# Patient Record
Sex: Female | Born: 1935 | Race: White | Hispanic: No | Smoking: Never smoker
Health system: Southern US, Community
[De-identification: ages and names within clinical notes are randomized; demographics above are authoritative.]

## PROBLEM LIST (undated history)

## (undated) DIAGNOSIS — D126 Benign neoplasm of colon, unspecified: Secondary | ICD-10-CM

## (undated) DIAGNOSIS — Z9289 Personal history of other medical treatment: Secondary | ICD-10-CM

## (undated) DIAGNOSIS — J449 Chronic obstructive pulmonary disease, unspecified: Secondary | ICD-10-CM

## (undated) DIAGNOSIS — E039 Hypothyroidism, unspecified: Secondary | ICD-10-CM

## (undated) DIAGNOSIS — J45909 Unspecified asthma, uncomplicated: Secondary | ICD-10-CM

## (undated) DIAGNOSIS — R0789 Other chest pain: Secondary | ICD-10-CM

## (undated) DIAGNOSIS — E785 Hyperlipidemia, unspecified: Secondary | ICD-10-CM

## (undated) DIAGNOSIS — M1990 Unspecified osteoarthritis, unspecified site: Secondary | ICD-10-CM

## (undated) DIAGNOSIS — R0602 Shortness of breath: Secondary | ICD-10-CM

## (undated) DIAGNOSIS — I10 Essential (primary) hypertension: Secondary | ICD-10-CM

## (undated) DIAGNOSIS — C801 Malignant (primary) neoplasm, unspecified: Secondary | ICD-10-CM

## (undated) DIAGNOSIS — K219 Gastro-esophageal reflux disease without esophagitis: Secondary | ICD-10-CM

## (undated) DIAGNOSIS — Z90710 Acquired absence of both cervix and uterus: Secondary | ICD-10-CM

## (undated) HISTORY — DX: Essential (primary) hypertension: I10

## (undated) HISTORY — DX: Hypothyroidism, unspecified: E03.9

## (undated) HISTORY — DX: Hyperlipidemia, unspecified: E78.5

## (undated) HISTORY — DX: Shortness of breath: R06.02

## (undated) HISTORY — PX: TOTAL SHOULDER REPLACEMENT: SUR1217

## (undated) HISTORY — DX: Other chest pain: R07.89

## (undated) HISTORY — DX: Benign neoplasm of colon, unspecified: D12.6

## (undated) HISTORY — PX: CARDIAC SURGERY: SHX584

## (undated) HISTORY — DX: Acquired absence of both cervix and uterus: Z90.710

## (undated) HISTORY — DX: Unspecified asthma, uncomplicated: J45.909

## (undated) HISTORY — DX: Unspecified osteoarthritis, unspecified site: M19.90

## (undated) HISTORY — DX: Personal history of other medical treatment: Z92.89

## (undated) HISTORY — PX: ABDOMINAL HYSTERECTOMY: SHX81

## (undated) HISTORY — PX: HEMORRHOID SURGERY: SHX153

---

## 1963-04-09 DIAGNOSIS — Z90710 Acquired absence of both cervix and uterus: Secondary | ICD-10-CM

## 1963-04-09 HISTORY — DX: Acquired absence of both cervix and uterus: Z90.710

## 1998-01-10 ENCOUNTER — Encounter: Payer: Self-pay | Admitting: Gastroenterology

## 1998-01-10 ENCOUNTER — Ambulatory Visit (HOSPITAL_COMMUNITY): Admission: RE | Admit: 1998-01-10 | Discharge: 1998-01-10 | Payer: Self-pay | Admitting: Gastroenterology

## 2000-02-05 ENCOUNTER — Encounter: Payer: Self-pay | Admitting: Family Medicine

## 2000-02-05 ENCOUNTER — Encounter: Admission: RE | Admit: 2000-02-05 | Discharge: 2000-02-05 | Payer: Self-pay | Admitting: Family Medicine

## 2000-02-15 ENCOUNTER — Ambulatory Visit (HOSPITAL_COMMUNITY): Admission: RE | Admit: 2000-02-15 | Discharge: 2000-02-15 | Payer: Self-pay | Admitting: *Deleted

## 2000-09-09 ENCOUNTER — Ambulatory Visit (HOSPITAL_COMMUNITY): Admission: RE | Admit: 2000-09-09 | Discharge: 2000-09-09 | Payer: Self-pay | Admitting: Orthopedic Surgery

## 2000-09-09 ENCOUNTER — Encounter: Payer: Self-pay | Admitting: Orthopedic Surgery

## 2001-02-05 ENCOUNTER — Encounter: Payer: Self-pay | Admitting: Family Medicine

## 2001-02-05 ENCOUNTER — Encounter: Admission: RE | Admit: 2001-02-05 | Discharge: 2001-02-05 | Payer: Self-pay | Admitting: Family Medicine

## 2001-02-09 ENCOUNTER — Other Ambulatory Visit: Admission: RE | Admit: 2001-02-09 | Discharge: 2001-02-09 | Payer: Self-pay | Admitting: Family Medicine

## 2001-08-17 ENCOUNTER — Encounter: Payer: Self-pay | Admitting: Family Medicine

## 2001-08-17 ENCOUNTER — Encounter: Admission: RE | Admit: 2001-08-17 | Discharge: 2001-08-17 | Payer: Self-pay | Admitting: Family Medicine

## 2002-04-06 ENCOUNTER — Encounter: Payer: Self-pay | Admitting: Family Medicine

## 2002-04-06 ENCOUNTER — Encounter: Admission: RE | Admit: 2002-04-06 | Discharge: 2002-04-06 | Payer: Self-pay | Admitting: Family Medicine

## 2002-12-15 ENCOUNTER — Other Ambulatory Visit: Admission: RE | Admit: 2002-12-15 | Discharge: 2002-12-15 | Payer: Self-pay | Admitting: Dermatology

## 2003-02-28 ENCOUNTER — Ambulatory Visit (HOSPITAL_COMMUNITY): Admission: RE | Admit: 2003-02-28 | Discharge: 2003-02-28 | Payer: Self-pay | Admitting: Internal Medicine

## 2003-05-30 ENCOUNTER — Encounter: Admission: RE | Admit: 2003-05-30 | Discharge: 2003-05-30 | Payer: Self-pay | Admitting: Family Medicine

## 2003-07-04 ENCOUNTER — Encounter (HOSPITAL_COMMUNITY): Admission: RE | Admit: 2003-07-04 | Discharge: 2003-08-03 | Payer: Self-pay | Admitting: Family Medicine

## 2003-07-25 ENCOUNTER — Ambulatory Visit (HOSPITAL_COMMUNITY): Admission: RE | Admit: 2003-07-25 | Discharge: 2003-07-25 | Payer: Self-pay | Admitting: Sports Medicine

## 2003-10-24 ENCOUNTER — Encounter: Admission: RE | Admit: 2003-10-24 | Discharge: 2003-10-24 | Payer: Self-pay | Admitting: Family Medicine

## 2004-03-05 ENCOUNTER — Encounter: Admission: RE | Admit: 2004-03-05 | Discharge: 2004-03-05 | Payer: Self-pay | Admitting: Family Medicine

## 2004-10-31 ENCOUNTER — Encounter: Admission: RE | Admit: 2004-10-31 | Discharge: 2004-10-31 | Payer: Self-pay | Admitting: Family Medicine

## 2005-02-05 ENCOUNTER — Ambulatory Visit (HOSPITAL_COMMUNITY): Admission: RE | Admit: 2005-02-05 | Discharge: 2005-02-05 | Payer: Self-pay | Admitting: Family Medicine

## 2005-06-26 ENCOUNTER — Ambulatory Visit (HOSPITAL_COMMUNITY): Admission: RE | Admit: 2005-06-26 | Discharge: 2005-06-26 | Payer: Self-pay | Admitting: Gastroenterology

## 2005-06-26 ENCOUNTER — Encounter (INDEPENDENT_AMBULATORY_CARE_PROVIDER_SITE_OTHER): Payer: Self-pay | Admitting: Specialist

## 2006-03-04 ENCOUNTER — Ambulatory Visit (HOSPITAL_COMMUNITY): Admission: RE | Admit: 2006-03-04 | Discharge: 2006-03-04 | Payer: Self-pay | Admitting: Family Medicine

## 2007-03-24 ENCOUNTER — Ambulatory Visit (HOSPITAL_COMMUNITY): Admission: RE | Admit: 2007-03-24 | Discharge: 2007-03-24 | Payer: Self-pay | Admitting: Family Medicine

## 2008-04-26 ENCOUNTER — Ambulatory Visit (HOSPITAL_COMMUNITY): Admission: RE | Admit: 2008-04-26 | Discharge: 2008-04-26 | Payer: Self-pay | Admitting: Family Medicine

## 2008-09-06 ENCOUNTER — Ambulatory Visit (HOSPITAL_COMMUNITY): Admission: RE | Admit: 2008-09-06 | Discharge: 2008-09-06 | Payer: Self-pay | Admitting: Family Medicine

## 2008-10-19 ENCOUNTER — Ambulatory Visit (HOSPITAL_COMMUNITY): Admission: RE | Admit: 2008-10-19 | Discharge: 2008-10-19 | Payer: Self-pay | Admitting: Orthopedic Surgery

## 2009-04-27 ENCOUNTER — Ambulatory Visit (HOSPITAL_COMMUNITY): Admission: RE | Admit: 2009-04-27 | Discharge: 2009-04-27 | Payer: Self-pay | Admitting: Family Medicine

## 2009-07-10 ENCOUNTER — Ambulatory Visit (HOSPITAL_COMMUNITY): Admission: RE | Admit: 2009-07-10 | Discharge: 2009-07-10 | Payer: Self-pay | Admitting: Family Medicine

## 2009-09-20 ENCOUNTER — Inpatient Hospital Stay (HOSPITAL_COMMUNITY): Admission: EM | Admit: 2009-09-20 | Discharge: 2009-09-24 | Payer: Self-pay | Admitting: Emergency Medicine

## 2009-09-20 ENCOUNTER — Ambulatory Visit: Payer: Self-pay | Admitting: Cardiology

## 2009-09-20 ENCOUNTER — Encounter (INDEPENDENT_AMBULATORY_CARE_PROVIDER_SITE_OTHER): Payer: Self-pay | Admitting: Internal Medicine

## 2009-10-18 ENCOUNTER — Ambulatory Visit: Payer: Self-pay | Admitting: Internal Medicine

## 2009-10-18 DIAGNOSIS — R059 Cough, unspecified: Secondary | ICD-10-CM | POA: Insufficient documentation

## 2009-10-18 DIAGNOSIS — R05 Cough: Secondary | ICD-10-CM

## 2009-10-18 DIAGNOSIS — C539 Malignant neoplasm of cervix uteri, unspecified: Secondary | ICD-10-CM | POA: Insufficient documentation

## 2009-10-18 DIAGNOSIS — E785 Hyperlipidemia, unspecified: Secondary | ICD-10-CM

## 2009-10-18 DIAGNOSIS — I10 Essential (primary) hypertension: Secondary | ICD-10-CM | POA: Insufficient documentation

## 2009-10-23 ENCOUNTER — Ambulatory Visit: Payer: Self-pay | Admitting: Internal Medicine

## 2009-10-23 DIAGNOSIS — J189 Pneumonia, unspecified organism: Secondary | ICD-10-CM | POA: Insufficient documentation

## 2009-12-14 ENCOUNTER — Encounter (INDEPENDENT_AMBULATORY_CARE_PROVIDER_SITE_OTHER): Payer: Self-pay | Admitting: *Deleted

## 2009-12-27 ENCOUNTER — Emergency Department (HOSPITAL_COMMUNITY): Admission: EM | Admit: 2009-12-27 | Discharge: 2009-12-27 | Payer: Self-pay | Admitting: Family Medicine

## 2009-12-28 ENCOUNTER — Inpatient Hospital Stay (HOSPITAL_COMMUNITY): Admission: EM | Admit: 2009-12-28 | Discharge: 2009-12-29 | Payer: Self-pay | Admitting: Emergency Medicine

## 2009-12-28 ENCOUNTER — Ambulatory Visit: Payer: Self-pay | Admitting: Family Medicine

## 2010-01-11 ENCOUNTER — Ambulatory Visit (HOSPITAL_COMMUNITY): Admission: RE | Admit: 2010-01-11 | Discharge: 2010-01-11 | Payer: Self-pay | Admitting: Family Medicine

## 2010-02-09 ENCOUNTER — Ambulatory Visit (HOSPITAL_COMMUNITY)
Admission: RE | Admit: 2010-02-09 | Discharge: 2010-02-09 | Payer: Self-pay | Source: Home / Self Care | Admitting: Family Medicine

## 2010-02-12 ENCOUNTER — Ambulatory Visit: Payer: Self-pay | Admitting: Gastroenterology

## 2010-02-12 DIAGNOSIS — K625 Hemorrhage of anus and rectum: Secondary | ICD-10-CM | POA: Insufficient documentation

## 2010-02-12 DIAGNOSIS — E119 Type 2 diabetes mellitus without complications: Secondary | ICD-10-CM

## 2010-02-12 DIAGNOSIS — K644 Residual hemorrhoidal skin tags: Secondary | ICD-10-CM | POA: Insufficient documentation

## 2010-02-12 HISTORY — DX: Hemorrhage of anus and rectum: K62.5

## 2010-03-05 ENCOUNTER — Ambulatory Visit: Payer: Self-pay | Admitting: Gastroenterology

## 2010-03-09 ENCOUNTER — Encounter: Payer: Self-pay | Admitting: Gastroenterology

## 2010-04-29 ENCOUNTER — Encounter: Payer: Self-pay | Admitting: Sports Medicine

## 2010-05-01 ENCOUNTER — Ambulatory Visit (HOSPITAL_BASED_OUTPATIENT_CLINIC_OR_DEPARTMENT_OTHER)
Admission: RE | Admit: 2010-05-01 | Discharge: 2010-05-01 | Disposition: A | Discharge status: Home / Self Care | Payer: Self-pay | Source: Home / Self Care | Attending: Orthopedic Surgery | Admitting: Orthopedic Surgery

## 2010-05-01 ENCOUNTER — Ambulatory Visit (HOSPITAL_COMMUNITY): Admission: RE | Admit: 2010-05-01 | Payer: Self-pay | Source: Home / Self Care | Admitting: Sports Medicine

## 2010-05-02 LAB — BASIC METABOLIC PANEL
BUN: 14 mg/dL (ref 6–23)
CO2: 30 mEq/L (ref 19–32)
Calcium: 9.4 mg/dL (ref 8.4–10.5)
Chloride: 100 mEq/L (ref 96–112)
Creatinine, Ser: 0.86 mg/dL (ref 0.4–1.2)
GFR calc Af Amer: >60 mL/min (ref >60)
GFR calc non Af Amer: >60 mL/min (ref >60)

## 2010-05-08 NOTE — Assessment & Plan Note (Signed)
Summary: NP follow up - med calendar   Copy to:  Self Primary Provider/Referring Provider:  NONE  CC:  new med calendar - pt brought all meds with her today.  no new complaints..  History of Present Illness: 77 yowf never smoker  with hbp on ace limited  by both hips and some back > hc sticker  June 15,2011admit to [facility] abupt onset < 24 h cp and sob > discharged 09/24/09 with dx of pna with esr 40 and bnp 254 and ct c/w atx > pna but no pulmonary emboli. did apparently have fever, resolved and reported much better by discharge overall per d/s.  October 18, 2009  1st pulmonary office eval cc ongoing  cp better but still congested and can't lie down without loosing breath and very hoarse     50 ft as far as can walk. mucus is still yellow. no lateralizing pleuritic cp or classic anginal cp.  sinuses feel congested also.  >>Last visit tx w/ Augmentin and prednisone. ACE stopped.   October 23, 2009--Presents for follow up and med review. Seen last visit w/ persistent cough, tx w/ abx ,and steroids. ACE stopped to help w/ persistent cough. Returns for [place] to review her meds.  She did not use tramadol, it was not sent to pharm. said her cough is so much better did not use it, mcuinex dm is helping. Can actually take breath w/out coughing.  Denies chest pain, dyspnea, orthopnea, hemoptysis, fever, n/v/d, edema, headache.   Medications Prior to Update: 1)  Potassium Chloride Cr 10 Meq Cr-Tabs (Potassium Chloride) .Marland Kitchen.. 1 Once Daily 2)  Furosemide 40 Mg Tabs (Furosemide) .Marland Kitchen.. 1 Once Daily 3)  Amlodipine Besylate 5 Mg Tabs (Amlodipine Besylate) .Marland Kitchen.. 1 Once Daily 4)  Lipitor 10 Mg Tabs (Atorvastatin Calcium) .Marland Kitchen.. 1 Once Daily 5)  Synthroid 50 Mcg Tabs (Levothyroxine Sodium) .Marland Kitchen.. 1 Once Daily 6)  Guanfacine Hcl 1 Mg Tabs (Guanfacine Hcl) .Marland Kitchen.. 1 Once Daily 7)  Fibercon 625 Mg Tabs (Calcium Polycarbophil) .... 2 Every Am 8)  Stool Softener 100 Mg Caps (Docusate Sodium) .... 2 At Bedtime 9)  Symbicort 80-4.5  Mcg/act  Aero (Budesonide-Formoterol Fumarate) .... 2 Puffs First Thing  in Am and 2 Puffs Again in Pm About 12 Hours Later 10)  Prednisone 10 Mg  Tabs (Prednisone) .... 4 Each Am X 2days, 2x2days, 1x2days and Stop 11)  Tramadol Hcl 50 Mg  Tabs (Tramadol Hcl) .... One To Two By Mouth Every 4-6 Hours For Cough or Congestion 12)  Prilosec Otc 20 Mg Tbec (Omeprazole Magnesium) .... 2 Puffs First Thing  in Am and 2 Puffs Again in Pm About 12 Hours Later 13)  Augmentin 875-125 Mg  Tabs (Amoxicillin-Pot Clavulanate) .... One Twice Daily  Allergies (verified): No Known Drug Allergies  Past History:  Past Surgical History: Last updated: 10/18/2009 Hemorrhoidectomy 1968 Hysterectomy 1965  Family History: Last updated: 10/23/2009 noncontributory  Social History: Last updated: 10/18/2009 Married Children Retired from [employer] Never smoker No ETOH  Past Medical History: Morbid obesity AODM Hyperlipidemia Hypertension    - Nl Coronaries by LHC 2001    - Echo 09/20/2009 : mild lae Complex med regimen. --Meds reviewed with pt education and computerized med calendar completed October 23, 2009  Cough -ace stopped 10/2009.   Family History: noncontributory  Review of Systems      See HPI  Vital Signs:  Patient profile:   77 year old female Height:      66 inches Weight:  230.13 pounds BMI:     37.28 O2 Sat:      92 % on Room air Temp:     97.3 degrees F oral Pulse rate:   94 / minute BP sitting:   132 / 82  (right arm) Cuff size:   regular  Vitals Entered By: Boone Master CNA/MA (October 23, 2009 2:10 PM)  O2 Flow:  Room air CC: new med calendar - pt brought all meds with her today.  no new complaints. Is Patient Diabetic? No Comments Medications reviewed with patient Daytime contact number verified with patient. Boone Master CNA/MA  October 23, 2009 2:08 PM    Physical Exam  Additional Exam:  obese amb wf mild hoarseness, no wheezing. wt 228 October 18, 2009>>230 October 23, 2009  HEENT: nl dentition, turbinates, and orophanx. Nl external ear canals without cough reflex Neck without JVD/Nodes/TM Lungs clear to A and P bilaterally without cough on insp or exp maneuvers RRR no s3 or murmur or increase in P2 Abd soft and benign with nl excursion in the supine position. No bruits or organomegaly Ext warm without calf tenderness, cyanosis clubbing or edema Skin warm and dry without lesions  .   Impression & Recommendations:  Problem # 1:  COUGH (ICD-786.2)  Upper airway cough syndrome post PNA  improved off ACE.  Will remain off for now, b/p is ok.  REC:  Meds reviewed with pt education and computerized med calendar completed/adjusted.   Hold fish oil.  Finish augmentin.  Use mucinex DM two times a day as needed cough/congestion Use sugarless candy, ice chips, water to help avoid cough/throat clearing.  No mints.  follow up Dr. Sherene Sires in 4 weeks.  Follow med calendar closely and bring to each visit.  Please contact office for sooner follow up if symptoms do not improve or worsen   Orders: Est. Patient Level IV (16109)  Problem # 2:  PNEUMONIA, ORGANISM UNSPECIFIED (ICD-486) PNA on xray/ct in june follow up xray today for clearance.  Her updated medication list for this problem includes:    Augmentin 875-125 Mg Tabs (Amoxicillin-pot clavulanate) ..... One twice daily  Orders: Est. Patient Level IV (99214) T-2 View CXR (71020TC)  Complete Medication List: 1)  Potassium Chloride Cr 10 Meq Cr-tabs (Potassium chloride) .Marland Kitchen.. 1 once daily 2)  Furosemide 40 Mg Tabs (Furosemide) .Marland Kitchen.. 1 once daily 3)  Amlodipine Besylate 5 Mg Tabs (Amlodipine besylate) .Marland Kitchen.. 1 once daily 4)  Lipitor 10 Mg Tabs (Atorvastatin calcium) .Marland Kitchen.. 1 once daily 5)  Synthroid 50 Mcg Tabs (Levothyroxine sodium) .Marland Kitchen.. 1 once daily 6)  Guanfacine Hcl 1 Mg Tabs (Guanfacine hcl) .Marland Kitchen.. 1 once daily 7)  Fibercon 625 Mg Tabs (Calcium polycarbophil) .... 2 every am 8)  Stool Softener 100 Mg  Caps (Docusate sodium) .... 2 at bedtime 9)  Symbicort 80-4.5 Mcg/act Aero (Budesonide-formoterol fumarate) .... 2 puffs first thing  in am and 2 puffs again in pm about 12 hours later 10)  Prednisone 10 Mg Tabs (Prednisone) .... 4 each am x 2days, 2x2days, 1x2days and stop 11)  Tramadol Hcl 50 Mg Tabs (Tramadol hcl) .... One to two by mouth every 4-6 hours for cough or congestion 12)  Prilosec Otc 20 Mg Tbec (Omeprazole magnesium) .... 2 puffs first thing  in am and 2 puffs again in pm about 12 hours later 13)  Augmentin 875-125 Mg Tabs (Amoxicillin-pot clavulanate) .... One twice daily  Patient Instructions: 1)  Hold fish oil.  2)  Finish augmentin.  3)  Use mucinex DM two times a day as needed cough/congestion 4)  Use sugarless candy, ice chips, water to help avoid cough/throat clearing.  5)  No mints.  6)  follow up Dr. Sherene Sires in 4 weeks.  7)  Follow med calendar closely and bring to each visit.  8)  Please contact office for sooner follow up if symptoms do not improve or worsen    Immunization History:  Influenza Immunization History:    Influenza:  historical (01/07/2008)  Pneumovax Immunization History:    Pneumovax:  historical (01/07/2008)   Appended Document: med calendar updated    Clinical Lists Changes  Medications: Added new medication of ASPIRIN 81 MG TBEC (ASPIRIN) once in the am Removed medication of TRAMADOL HCL 50 MG  TABS (TRAMADOL HCL) One to two by mouth every 4-6 hours for cough or congestion Removed medication of AUGMENTIN 875-125 MG  TABS (AMOXICILLIN-POT CLAVULANATE) one twice daily Removed medication of PREDNISONE 10 MG  TABS (PREDNISONE) 4 each am x 2days, 2x2days, 1x2days and stop Added new medication of MUCINEX DM 30-600 MG XR12H-TAB (DEXTROMETHORPHAN-GUAIFENESIN) 1 every 12 hr as needed

## 2010-05-08 NOTE — Letter (Signed)
Summary: Patient Notice- Polyp Results  [facility]  [address] [place], [place] [address]   Phone: [phone]  Fax: [phone]        March 09, 2010 MRN: [id number]    Sandra Cuevas [address] [place] [address] [place], [place]  [address]    Dear Sandra Cuevas,  I am pleased to inform you that the colon polyp(s) removed during your recent colonoscopy was (were) found to be benign (no cancer detected) upon pathologic examination.  I recommend you have a repeat colonoscopy examination in _ years to look for recurrent polyps, as having colon polyps increases your risk for having recurrent polyps or even colon cancer in the future.  Should you develop new or worsening symptoms of abdominal pain, bowel habit changes or bleeding from the rectum or bowels, please schedule an evaluation with either your primary care physician or with me.  Additional information/recommendations:  _x_ No further action with gastroenterology is needed at this time. Please      follow-up with your primary care physician for your other healthcare      needs.  __ Please call [phone] to schedule a return visit to review your      situation.  __ Please keep your follow-up visit as already scheduled.  __ Continue treatment plan as outlined the day of your exam.  Please call us if you are having persistent problems or have questions about your condition that have not been fully answered at this time.  Sincerely,  Louis Meckel MD  This letter has been electronically signed by your physician.  Appended Document: Patient Notice- Polyp Results Letter mailed

## 2010-05-08 NOTE — Letter (Signed)
Summary: [facility] Instructions  [facility]  [address] [place], [place] [address]   Phone: [phone]  Fax: [phone]       Sandra Cuevas    [birth year]    MRN: [id number]        Procedure Day /Date:MONDAY 03/05/2010     Arrival Time:3PM     Procedure Time:4PM     Location of Procedure:                    [facility] (4th Floor)                        PREPARATION FOR COLONOSCOPY WITH MOVIPREP   Starting 5 days prior to your procedure 11/23 do not eat nuts, seeds, popcorn, corn, beans, peas,  salads, or any raw vegetables.  Do not take any fiber supplements (e.g. Metamucil, Citrucel, and Benefiber).  THE DAY BEFORE YOUR PROCEDURE         DATE: 03/04/2010  DAY: SUN  1.  Drink clear liquids the entire day-NO SOLID FOOD  2.  Do not drink anything colored red or purple.  Avoid juices with pulp.  No orange juice.  3.  Drink at least 64 oz. (8 glasses) of fluid/clear liquids during the day to prevent dehydration and help the prep work efficiently.  CLEAR LIQUIDS INCLUDE: Water Jello Ice Popsicles Tea (sugar ok, no milk/cream) Powdered fruit flavored drinks Coffee (sugar ok, no milk/cream) Gatorade Juice: apple, white grape, white cranberry  Lemonade Clear bullion, consomm, broth Carbonated beverages (any kind) Strained chicken noodle soup Hard Candy                             4.  In the morning, mix first dose of MoviPrep solution:    Empty 1 Pouch A and 1 Pouch B into the disposable container    Add lukewarm drinking water to the top line of the container. Mix to dissolve    Refrigerate (mixed solution should be used within 24 hrs)  5.  Begin drinking the prep at 5:00 p.m. The MoviPrep container is divided by 4 marks.   Every 15 minutes drink the solution down to the next mark (approximately 8 oz) until the full liter is complete.   6.  Follow completed prep with 16 oz of clear liquid of your choice (Nothing red or purple).   Continue to drink clear liquids until bedtime.  7.  Before going to bed, mix second dose of MoviPrep solution:    Empty 1 Pouch A and 1 Pouch B into the disposable container    Add lukewarm drinking water to the top line of the container. Mix to dissolve    Refrigerate  THE DAY OF YOUR PROCEDURE      DATE: 11/28 DAY: MONDAY  Beginning at11a.m. (5 hours before procedure):         1. Every 15 minutes, drink the solution down to the next mark (approx 8 oz) until the full liter is complete.  2. Follow completed prep with 16 oz. of clear liquid of your choice.    3. You may drink clear liquids until 2PM (2 HOURS BEFORE PROCEDURE).   MEDICATION INSTRUCTIONS  Unless otherwise instructed, you should take regular prescription medications with a small sip of water   as early as possible the morning of your procedure.  OTHER INSTRUCTIONS  You will need a responsible adult at least 75 years of age to accompany you and drive you home.   This person must remain in the waiting room during your procedure.  Wear loose fitting clothing that is easily removed.  Leave jewelry and other valuables at home.  However, you may wish to bring a book to read or  an iPod/MP3 player to listen to music as you wait for your procedure to start.  Remove all body piercing jewelry and leave at home.  Total time from sign-in until discharge is approximately 2-3 hours.  You should go home directly after your procedure and rest.  You can resume normal activities the  day after your procedure.  The day of your procedure you should not:   Drive   Make legal decisions   Operate machinery   Drink alcohol   Return to work  You will receive specific instructions about eating, activities and medications before you leave.    The above instructions have been reviewed and explained to me by   _______________________    I fully understand and can verbalize these instructions  _____________________________ Date _________

## 2010-05-08 NOTE — Assessment & Plan Note (Signed)
Summary: Pulmonary/ new pt eval   Visit Type:  Initial Consult Copy to:  Self Primary Provider/Referring Provider:  NONE  CC:  Cough.  History of Present Illness: 86 yowf never smoker  with hbp on ace limited  by both hips and some back > hc sticker  June 15,2011admit to [facility] abupt onset < 24 h cp and sob > discharged 09/24/09 with dx of pna with esr 40 and bnp 254 and ct c/w atx > pna but no pulmonary emboli. did apparently have fever, resolved and reported much better by discharge overall per d/s.  October 18, 2009  1st pulmonary office eval cc ongoing  cp better but still congested and can't lie down without loosing breath and very hoarse     50 ft as far as can walk. mucus is still yellow. no lateralizing pleuritic cp or classic anginal cp.  sinuses feel congested also. Pt denies any significant sore throat, dysphagia, itching, sneezing,    fever, chills, sweats, unintended wt loss,  hempoptysis, change in activity tolerance  orthopnea pnd or leg swelling. Pt also denies any obvious fluctuation in symptoms with weather or environmental change or other alleviating or aggravating factors.       Current Medications (verified): 1)  Potassium Chloride Cr 10 Meq Cr-Tabs (Potassium Chloride) .Marland Kitchen.. 1 Once Daily 2)  Lisinopril 10 Mg Tabs (Lisinopril) .Marland Kitchen.. 1 Once Daily 3)  Furosemide 40 Mg Tabs (Furosemide) .Marland Kitchen.. 1 Once Daily 4)  Amlodipine Besylate 5 Mg Tabs (Amlodipine Besylate) .Marland Kitchen.. 1 Once Daily 5)  Lipitor 10 Mg Tabs (Atorvastatin Calcium) .Marland Kitchen.. 1 Once Daily 6)  Synthroid 50 Mcg Tabs (Levothyroxine Sodium) .Marland Kitchen.. 1 Once Daily 7)  Guanfacine Hcl 1 Mg Tabs (Guanfacine Hcl) .Marland Kitchen.. 1 Once Daily 8)  Fibercon 625 Mg Tabs (Calcium Polycarbophil) .... 2 Every Am 9)  Stool Softener 100 Mg Caps (Docusate Sodium) .... 2 At Bedtime  Allergies (verified): No Known Drug Allergies  Past History:  Past Medical History: Morbid obesity AODM Hyperlipidemia Hypertension    - Nl Coronaries by LHC 2001    -  Echo 09/20/2009 : mild lae  Past Surgical History: Hemorrhoidectomy 1968 Hysterectomy 1965  Social History: Married Children Retired from [employer] Never smoker No ETOH  Review of Systems       The patient complains of shortness of breath at rest, productive cough, chest pain, headaches, nasal congestion/difficulty breathing through nose, sneezing, hand/feet swelling, and joint stiffness or pain.  The patient denies shortness of breath with activity, non-productive cough, coughing up blood, irregular heartbeats, acid heartburn, indigestion, loss of appetite, weight change, abdominal pain, difficulty swallowing, sore throat, tooth/dental problems, itching, ear ache, anxiety, depression, rash, change in color of mucus, and fever.    Vital Signs:  Patient profile:   77 year old female Height:      66 inches Weight:      228 pounds BMI:     36.93 O2 Sat:      92 % on Room air Temp:     98.7 degrees F oral Pulse rate:   98 / minute BP sitting:   130 / 48  (left arm)  Vitals Entered By: Vernie Murders (October 18, 2009 11:55 AM)  O2 Flow:  Room air  Physical Exam  Additional Exam:  obese amb wf very hoarse with pseudowheeze resolves with purse lip maneuver and congested sounding cough wt 228 October 18, 2009 HEENT: nl dentition, turbinates, and orophanx. Nl external ear canals without cough reflex Neck without JVD/Nodes/TM Lungs  clear to A and P bilaterally without cough on insp or exp maneuvers RRR no s3 or murmur or increase in P2 Abd soft and benign with nl excursion in the supine position. No bruits or organomegaly Ext warm without calf tenderness, cyanosis clubbing or edema Skin warm and dry without lesions  .   CXR  Procedure date:  09/23/2009  Findings:      decreased aeration in bases atx vs effusions  Impression & Recommendations:  Problem # 1:  COUGH (ICD-786.2)  The most common causes of chronic cough in immunocompetent adults include: upper airway cough syndrome  (UACS), previously referred to as postnasal drip syndrome,  caused by variety of rhinosinus conditions; (2) asthma; (3) GERD; (4) chronic bronchitis from cigarette smoking or other inhaled environmental irritants; (5) nonasthmatic eosinophilic bronchitis; and (6) bronchiectasis. These conditions, singly or in combination, have accounted for up to 94% of the causes of chronic cough in prospective studies.  Most likely this is partly ace related so try off and rx as  Classic Upper airway cough syndrome, so named because it's frequently impossible to sort out how much is  CR/sinusitis with freq throat clearing (which can be related to primary GERD)   vs  causing  secondary extra esophageal GERD from wide swings in gastric pressure that occur with throat clearing, promoting self use of mint and menthol lozenges that reduce the lower esophageal sphincter tone and exacerbate the problem further These are the same pts who not infrequently have failed to tolerate ace inhibitors,  dry powder inhalers or biphosphonates or report having reflux symptoms that don't respond to standard doses of PPI   rx ppi plus sinusitis rx  plus add symbiocort 80 short trial to cover possibilty of asthma component  I spent extra time with the patient today explaining optimal mdi  technique.  This improved from  25-50% but no better.  If condition worsens at all may need to consider admit > declined today  Problem # 2:  HYPERTENSION (ICD-401.9)  The following medications were removed from the medication list:    Lisinopril 10 Mg Tabs (Lisinopril) .Marland Kitchen... 1 once daily Her updated medication list for this problem includes:    Furosemide 40 Mg Tabs (Furosemide) .Marland Kitchen... 1 once daily    Amlodipine Besylate 5 Mg Tabs (Amlodipine besylate) .Marland Kitchen... 1 once daily    Guanfacine Hcl 1 Mg Tabs (Guanfacine hcl) .Marland Kitchen... 1 once daily    ACE inhibitors are problematic in  pts with airway complaints because  even experienced pulmonologists can't  always distinguish ace effects from copd/asthma.  By themselves they don't actually cause a problem, much like oxygen can't by itself start a fire, but they certainly serve as a powerful catalyst or enhancer for any "fire"  or inflammatory process in the upper airway, be it caused by an ET  tube or more commonly reflux (especially in the obese or pts with known GERD or who are on biphoshonates).  In the era of ARB near equivalency until we have a better handle on the reversibility of the airway problem, it just makes sense to avoid ace entirely in the short run and then decide later, having established a level of airway control using a reasonable limited regimen, whether to add back ace but even then being very careful to observe the pt for worsening airway control and number of meds used/ needed to control symptoms.     Each maintenance medication was reviewed in detail including most importantly the difference between  maintenance and as needed and under what circumstances the prns are to be used.  To keep things simple, I have asked the patient to first separate medicines that are perceived as maintenance, that is to be taken daily "no matter what", from those medicines that are taken on only on an as-needed basis and I have given the patient examples of both, and then return to see our NP to generate a  detailed  medication calendar which should be followed until the next physician sees the patient and updates it so we're sure that we're all reading from the same page  Medications Added to Medication List This Visit: 1)  Potassium Chloride Cr 10 Meq Cr-tabs (Potassium chloride) .Marland Kitchen.. 1 once daily 2)  Lisinopril 10 Mg Tabs (Lisinopril) .Marland Kitchen.. 1 once daily 3)  Furosemide 40 Mg Tabs (Furosemide) .Marland Kitchen.. 1 once daily 4)  Amlodipine Besylate 5 Mg Tabs (Amlodipine besylate) .Marland Kitchen.. 1 once daily 5)  Lipitor 10 Mg Tabs (Atorvastatin calcium) .Marland Kitchen.. 1 once daily 6)  Synthroid 50 Mcg Tabs (Levothyroxine sodium) .Marland Kitchen.. 1 once  daily 7)  Guanfacine Hcl 1 Mg Tabs (Guanfacine hcl) .Marland Kitchen.. 1 once daily 8)  Fibercon 625 Mg Tabs (Calcium polycarbophil) .... 2 every am 9)  Stool Softener 100 Mg Caps (Docusate sodium) .... 2 at bedtime 10)  Symbicort 80-4.5 Mcg/act Aero (Budesonide-formoterol fumarate) .... 2 puffs first thing  in am and 2 puffs again in pm about 12 hours later 11)  Prednisone 10 Mg Tabs (Prednisone) .... 4 each am x 2days, 2x2days, 1x2days and stop 12)  Tramadol Hcl 50 Mg Tabs (Tramadol hcl) .... One to two by mouth every 4-6 hours for cough or congestion 13)  Prilosec Otc 20 Mg Tbec (Omeprazole magnesium) .... 2 puffs first thing  in am and 2 puffs again in pm about 12 hours later 14)  Augmentin 875-125 Mg Tabs (Amoxicillin-pot clavulanate) .... One twice daily  Other Orders: New Patient Level V ([address]) HFA Instruction [phone])  Patient Instructions: 1)  stop lisinopril 2)  start prilosec Take one 30-60 min before first and last meals of the day and GERD (REFLUX)  is a common cause of respiratory symptoms. It commonly presents without heartburn and can be treated with medication, but also with lifestyle changes including avoidance of late meals, excessive alcohol, smoking cessation, and avoid fatty foods, chocolate, peppermint, colas, red wine, and acidic juices such as orange juice. NO MINT OR MENTHOL PRODUCTS SO NO COUGH DROPS  3)  USE SUGARLESS CANDY INSTEAD (jolley ranchers)  4)  NO OIL BASED VITAMINS  5)  Symbicort 80 2 puffs first thing  in am and 2 puffs again in pm about 12 hours later  6)  Augmentin twice daily x 7 days 7)  Take mucinex dm 2 every 12 hours and add tramadol 50 mg up to every 4 hours to suppress the urge to cough. Swallowing water or using ice chips/non mint and menthol containing candies (such as lifesavers or sugarless jolly ranchers) are also effective.  8)  Prednisone 4 each am x 2days, 2x2days, 1x2days and stop  9)  See Tammy NP w/in 1weeks with all your medications, even over the  counter meds, separated in two separate bags, the ones you take no matter what vs the ones you stop once you feel better and take only as needed.  She will generate for you a new user friendly medication calendar that will put [place] all on the same page re: your medication use.  Prescriptions: AUGMENTIN  875-125 MG  TABS (AMOXICILLIN-POT CLAVULANATE) one twice daily  #14 x 0   Entered and Authorized by:   Nyoka Cowden MD   Signed by:   Nyoka Cowden MD on 10/18/2009   Method used:   Electronically to        [facility] (retail)       [address]       [place], [place]  [address]       Ph: [phone]       Fax: [phone]   RxID:   [id number] PREDNISONE 10 MG  TABS (PREDNISONE) 4 each am x 2days, 2x2days, 1x2days and stop  #14 x 0   Entered and Authorized by:   Nyoka Cowden MD   Signed by:   Nyoka Cowden MD on 10/18/2009   Method used:   Electronically to        [facility] (retail)       [address]       [place], [place]  [address]       Ph: [phone]       Fax: [phone]   RxID:   [id number]

## 2010-05-08 NOTE — Procedures (Signed)
Summary: Colonoscopy  Patient: Sandra Cuevas Note: All result statuses are Final unless otherwise noted.  Tests: (1) Colonoscopy (COL)   COL Colonoscopy           DONE     [facility]     [address]     [place], [place]  [address]           COLONOSCOPY PROCEDURE REPORT           PATIENT:  Sandra Cuevas  MR#:  [id number]     BIRTHDATE:  1928/11/11, 81 yrs. old  GENDER:  female           ENDOSCOPIST:  Barbette Hair. Arlyce Dice, MD     Referred by:           PROCEDURE DATE:  03/05/2010     PROCEDURE:  Colonoscopy with snare polypectomy     ASA CLASS:  Class II     INDICATIONS:  1) rectal bleeding           MEDICATIONS:   Fentanyl 75 mcg IV, Versed 9 mg IV, Benadryl 25 mg     IV           DESCRIPTION OF PROCEDURE:   After the risks benefits and     alternatives of the procedure were thoroughly explained, informed     consent was obtained.  Digital rectal exam was performed and     revealed perianal skin tags.   The LB 180AL [id number] endoscope was     introduced through the anus and advanced to the cecum, which was     identified by both the appendix and ileocecal valve, without     limitations.  The quality of the prep was Moviprep fair.  The     instrument was then slowly withdrawn as the colon was fully     examined.<<PROCEDUREIMAGES>>           FINDINGS:  A sessile polyp was found in the descending colon. It     was 6 mm in size. Flat polyp Polyp was snared without cautery.     Retrieval was successful (see image6). snare polyp  Severe     diverticulosis was found in the sigmoid to descending colon     segments (see image1 and image10).  Internal hemorrhoids were     found (see image13).  This was otherwise a normal examination of     the colon (see image2, image3, image4, image5, and image11).     Retroflexed views in the rectum revealed no abnormalities.    The     time to cecum =  14.75  minutes. The scope was then withdrawn     (time =  7.50  min) from the patient and  the procedure completed.           COMPLICATIONS:  None           ENDOSCOPIC IMPRESSION:     1) 6 mm sessile polyp in the descending colon     2) Severe diverticulosis in the sigmoid to descending colon     segments     3) Internal hemorrhoids     4) Otherwise normal examination           Limited rectal bleeding due to hemorrhoids           RECOMMENDATIONS:     1) Return to the care of your primary provider. GI follow up as     needed.  No routine  f/u colonoscopy (age)     2) Sedation with MAC for future procedures           REPEAT EXAM:  No           ______________________________     Barbette Hair. Arlyce Dice, MD           CC: Sandra Fusi DO           n.     eSIGNED:   Barbette Hair. Kaplan at 03/05/2010 04:42 PM           Sandra Cuevas, [id number]  Note: An exclamation mark (!) indicates a result that was not dispersed into the flowsheet. Document Creation Date: 03/05/2010 4:42 PM _______________________________________________________________________  (1) Order result status: Final Collection or observation date-time: 03/05/2010 16:34 Requested date-time:  Receipt date-time:  Reported date-time:  Referring Physician:   Ordering Physician: Sandra Cuevas [phone]) Specimen Source:  Source: Launa Grill Order Number: [id number] Lab site:

## 2010-05-08 NOTE — Letter (Signed)
Summary: New Patient letter  [facility]  [address] [place], [place] [address]   Phone: [phone]  Fax: [phone]       12/14/2009 MRN: [id number]  Sandra Cuevas [address] [place] [address] [place], [place]  [address]  Dear Sandra Cuevas,  Welcome to the Gastroenterology Division at [facility].    You are scheduled to see Dr.  Melvia Heaps on February 12, 2010 at 10:45am on the 3rd floor at [facility], [address]  We ask that you try to arrive at our office 15 minutes prior to your appointment time to allow for check-in.  We would like you to complete the enclosed self-administered evaluation form prior to your visit and bring it with you on the day of your appointment.  We will review it with you.  Also, please bring a complete list of all your medications or, if you prefer, bring the medication bottles and we will list them.  Please bring your insurance card so that we may make a copy of it.  If your insurance requires a referral to see a specialist, please bring your referral form from your primary care physician.  Co-payments are due at the time of your visit and may be paid by cash, check or credit card.     Your office visit will consist of a consult with your physician (includes a physical exam), any laboratory testing he/she may order, scheduling of any necessary diagnostic testing (e.g. x-ray, ultrasound, CT-scan), and scheduling of a procedure (e.g. Endoscopy, Colonoscopy) if required.  Please allow enough time on your schedule to allow for any/all of these possibilities.    If you cannot keep your appointment, please call [phone] to cancel or reschedule prior to your appointment date.  This allows [place] the opportunity to schedule an appointment for another patient in need of care.  If you do not cancel or reschedule by 5 p.m. the business day prior to your appointment date, you will be charged a $50.00 late cancellation/no-show fee.    Thank you for  choosing [facility] for your medical needs.  We appreciate the opportunity to care for you.  Please visit [place] at our website  to learn more about our practice.                     Sincerely,                                                             The Gastroenterology Division

## 2010-05-08 NOTE — Assessment & Plan Note (Signed)
Summary: consult before col ...em   History of Present Illness Visit Type: new patient Primary GI MD: Melvia Heaps MD [facility] Primary Provider: NONE Chief Complaint: discuss colonoscopy, pt is also having a lot of rectal pain/bleeding.  Pt had impaction in hosptial in June 2011 that was manually removed.  Pt has had rectal pain since that time. History of Present Illness:   Sandra Cuevas is a 77 year old white female referred request of Dr. Phillips Odor for evaluation of rectal bleeding.  She has been complaining of rectal soreness, itching and bleeding.  She notices blood when she cleans herself.    She denies change in bowel habits.  She has used suppositories without relief.   GI Review of Systems    Reports acid reflux and  bloating.      Denies abdominal pain, belching, chest pain, dysphagia with liquids, dysphagia with solids, heartburn, loss of appetite, nausea, vomiting, vomiting blood, weight loss, and  weight gain.      Reports hemorrhoids.     Denies anal fissure, black tarry stools, change in bowel habit, constipation, diarrhea, diverticulosis, fecal incontinence, heme positive stool, irritable bowel syndrome, jaundice, light color stool, liver problems, rectal bleeding, and  rectal pain. Preventive Screening-Counseling & Management      Drug Use:  no.      Current Medications (verified): 1)  Stool Softener 100 Mg Caps (Docusate Sodium) .... 2 At Bedtime 2)  Fibercon 625 Mg Tabs (Calcium Polycarbophil) .... 2 Every Am 3)  Amlodipine Besylate 5 Mg Tabs (Amlodipine Besylate) .Marland Kitchen.. 1 Once Daily 4)  Guanfacine Hcl 1 Mg Tabs (Guanfacine Hcl) .Marland Kitchen.. 1 Once Daily 5)  Furosemide 40 Mg Tabs (Furosemide) .Marland Kitchen.. 1 Once Daily 6)  Aspirin 81 Mg Tbec (Aspirin) .... Once in The Am 7)  Lipitor 10 Mg Tabs (Atorvastatin Calcium) .Marland Kitchen.. 1 Once Daily 8)  Synthroid 50 Mcg Tabs (Levothyroxine Sodium) .Marland Kitchen.. 1 Once Daily 9)  Prilosec Otc 20 Mg Tbec (Omeprazole Magnesium) .... Take 1 Tab By Mouth Two Times A Day  Before [place] and Dinner 10)  Symbicort 80-4.5 Mcg/act  Aero (Budesonide-Formoterol Fumarate) .... 2 Puffs First Thing  in Am and 2 Puffs Again in Pm About 12 Hours Later 11)  Potassium Chloride Cr 10 Meq Cr-Tabs (Potassium Chloride) .Marland Kitchen.. 1 Once Daily 12)  Mucinex Dm 30-600 Mg Xr12h-Tab (Dextromethorphan-Guaifenesin) .Marland Kitchen.. 1 Every 12 Hr As Needed  Allergies (verified): No Known Drug Allergies  Past History:  Past Medical History: Morbid obesity AODM Hyperlipidemia Hypertension    - Nl Coronaries by LHC 2001    - Echo 09/20/2009 : mild lae Complex med regimen. --Meds reviewed with pt education and computerized med calendar completed October 23, 2009  Cough -ace stopped 10/2009.  Cancer-Uterine Arthritis Asthma Pneumonia--09/2009  Past Surgical History: Reviewed history from 10/18/2009 and no changes required. Hemorrhoidectomy 1968 Hysterectomy 1965  Family History: noncontributory Family History of Colon Cancer: Sister Family History of Leukemia: Brother Family History of Pancreatic Cancer: Brother Family History of Diabetes: Sisters, Brother, Mother Family History of Heart Disease: Father, Sister Family History of Kidney Disease: Mother  Social History: Pt has 22 brothers and sisters Married, 1 boy, 1 girl Retired from [employer] Never smoker No ETOH Illicit Drug Use - no Drug Use:  no  Review of Systems       The patient complains of allergy/sinus, arthritis/joint pain, back pain, shortness of breath, sleeping problems, and swelling of feet/legs.  The patient denies anemia, anxiety-new, blood in urine, breast changes/lumps, confusion, cough, coughing up  blood, depression-new, fainting, fatigue, fever, headaches-new, hearing problems, heart murmur, heart rhythm changes, itching, menstrual pain, muscle pains/cramps, night sweats, nosebleeds, pregnancy symptoms, skin rash, sore throat, swollen lymph glands, thirst - excessive, urination - excessive, urination changes/pain,  urine leakage, vision changes, and voice change.         All other systems were reviewed and were negative   Vital Signs:  Patient profile:   77 year old female Height:      66 inches Weight:      236 pounds BMI:     38.23 Pulse rate:   84 / minute Pulse rhythm:   regular BP sitting:   140 / 84  (left arm) Cuff size:   large  Vitals Entered By: Francee Piccolo CMA Duncan Dull) (February 12, 2010 10:58 AM)  Physical Exam  Additional Exam:  On physical exam she is an obese female  skin: anicteric HEENT: normocephalic; PEERLA; no nasal or pharyngeal abnormalities neck: supple nodes: no cervical lymphadenopathy chest: clear to ausculatation and percussion heart: no murmurs, gallops, or rubs abd: soft, nontender; BS normoactive; no abdominal masses, tenderness, organomegaly rectal: no masses; external hemorrhoids are present. ext: no cynanosis, clubbing, edema skeletal: no deformities neuro: oriented x 3; no focal abnormalities    Impression & Recommendations:  Problem # 1:  RECTAL BLEEDING (ICD-569.3)  Bleeding certainly could be due to hemorrhoids.  A more proximal colonic bleeding source should be ruled out.  Recommendations #1 colonoscopy  Risks, alternatives, and complications of the procedure, including bleeding, perforation, and possible need for surgery, were explained to the patient.  Patient's questions were answered.  Orders: Colonoscopy (Colon)  Problem # 2:  EXTERNAL HEMORRHOIDS WITHOUT MENTION COMP (ICD-455.3)  Plan Anusol HC suppositories,  warm soaks endocrine  Orders: Colonoscopy (Colon)  Problem # 3:  DM (ICD-250.00) Assessment: Comment Only  Patient Instructions: 1)  Your Colonoscopy is scheduled on 03/05/2010 at 4pm 2)  You can pick up your new prescriptions at your pharmacy today 3)  Colonoscopy and Flexible Sigmoidoscopy brochure given.  4)  Conscious Sedation brochure given.  5)  The medication list was reviewed and reconciled.  All  changed / newly prescribed medications were explained.  A complete medication list was provided to the patient / caregiver. Prescriptions: MOVIPREP 100 GM  SOLR (PEG-KCL-NACL-NASULF-NA ASC-C) As per prep instructions.  #1 x 0   Entered by:   Merri Ray CMA (AAMA)   Authorized by:   Louis Meckel MD   Signed by:   Merri Ray CMA (AAMA) on 02/12/2010   Method used:   Electronically to        [facility] (retail)       [address]       [place], [place]  [address]       Ph: [phone]       Fax: [phone]   RxID:   [id number] ANUSOL-HC 2.5 % CREA (HYDROCORTISONE) Apply after each bowel movement  #1 x 1   Entered and Authorized by:   Louis Meckel MD   Signed by:   Louis Meckel MD on 02/12/2010   Method used:   Electronically to        [facility] (retail)       [address]       [place], [place]  [address]       Ph: [phone]  Fax: [phone]   RxID:   [id number] ANUSOL-HC 25 MG SUPP (HYDROCORTISONE ACETATE) take one suppository before bedtime  #7 x 1   Entered and Authorized by:   Louis Meckel MD   Signed by:   Louis Meckel MD on 02/12/2010   Method used:   Electronically to        [facility] (retail)       [address]       [place], [place]  [address]       Ph: [phone]       Fax: [phone]   RxID:   [id number]

## 2010-05-08 NOTE — Miscellaneous (Signed)
  Clinical Lists Changes  Medications: Added new medication of ANUSOL-HC 25 MG  SUPP (HYDROCORTISONE ACETATE) take 1 at bedtime - Signed Rx of ANUSOL-HC 25 MG  SUPP (HYDROCORTISONE ACETATE) take 1 at bedtime;  #7 x 1;  Signed;  Entered by: Louis Meckel MD;  Authorized by: Louis Meckel MD;  Method used: Electronically to [facility] [address], [place], [place], [place]  [address], Ph: [phone], Fax: [phone]    Prescriptions: ANUSOL-HC 25 MG  SUPP (HYDROCORTISONE ACETATE) take 1 at bedtime  #7 x 1   Entered and Authorized by:   Louis Meckel MD   Signed by:   Louis Meckel MD on 03/05/2010   Method used:   Electronically to        [facility] (retail)       [address]       [place], [place]  [address]       Ph: [phone]       Fax: [phone]   RxID:   [id number]

## 2010-05-08 NOTE — Letter (Signed)
Summary: Results Letter  [facility]  [address] [place], [place] [address]   Phone: [phone]  Fax: [phone]        February 12, 2010 MRN: [id number]    Sandra Cuevas [address] [place], [place]  [address]    Dear [name],  It is my pleasure to have treated you recently as a new patient in my office. I appreciate your confidence and the opportunity to participate in your care.  Since I do have a busy inpatient endoscopy schedule and office schedule, my office hours vary weekly. I am, however, available for emergency calls everyday through my office. If I am not available for an urgent office appointment, another one of our gastroenterologist will be able to assist you.  My well-trained staff are prepared to help you at all times. For emergencies after office hours, a physician from our Gastroenterology section is always available through my 24 hour answering service  Once again I welcome you as a new patient and I look forward to a happy and healthy relationship             Sincerely,  Louis Meckel MD  This letter has been electronically signed by your physician.  Appended Document: Results Letter LETTER MAILED

## 2010-05-09 ENCOUNTER — Encounter: Payer: Self-pay | Admitting: Sports Medicine

## 2010-05-16 ENCOUNTER — Institutional Professional Consult (permissible substitution) (INDEPENDENT_AMBULATORY_CARE_PROVIDER_SITE_OTHER): Payer: Medicare HMO | Admitting: Cardiovascular Disease

## 2010-05-16 DIAGNOSIS — E119 Type 2 diabetes mellitus without complications: Secondary | ICD-10-CM

## 2010-05-16 DIAGNOSIS — R079 Chest pain, unspecified: Secondary | ICD-10-CM

## 2010-05-16 DIAGNOSIS — I119 Hypertensive heart disease without heart failure: Secondary | ICD-10-CM

## 2010-05-25 ENCOUNTER — Ambulatory Visit: Payer: Self-pay | Admitting: Cardiology

## 2010-06-01 ENCOUNTER — Telehealth (INDEPENDENT_AMBULATORY_CARE_PROVIDER_SITE_OTHER): Payer: Self-pay | Admitting: *Deleted

## 2010-06-01 DIAGNOSIS — Z9289 Personal history of other medical treatment: Secondary | ICD-10-CM

## 2010-06-01 HISTORY — DX: Personal history of other medical treatment: Z92.89

## 2010-06-04 ENCOUNTER — Encounter: Payer: Self-pay | Admitting: Cardiovascular Disease

## 2010-06-04 ENCOUNTER — Ambulatory Visit (HOSPITAL_COMMUNITY): Payer: Medicare PPO | Attending: Internal Medicine

## 2010-06-04 DIAGNOSIS — R0609 Other forms of dyspnea: Secondary | ICD-10-CM

## 2010-06-04 DIAGNOSIS — I4510 Unspecified right bundle-branch block: Secondary | ICD-10-CM

## 2010-06-04 DIAGNOSIS — R079 Chest pain, unspecified: Secondary | ICD-10-CM | POA: Insufficient documentation

## 2010-06-05 ENCOUNTER — Ambulatory Visit (HOSPITAL_COMMUNITY): Payer: Medicare PPO

## 2010-06-05 DIAGNOSIS — R0989 Other specified symptoms and signs involving the circulatory and respiratory systems: Secondary | ICD-10-CM

## 2010-06-05 NOTE — Progress Notes (Signed)
Summary: Nuclear Pre-Procedure  Phone Note Outgoing Call Call back at [facility] Phone [phone]   Call placed by: Sandra Cuevas, EMT-P,  June 01, 2010 12:35 PM Call placed to: Patient Action Taken: Phone Call Completed Summary of Call: Reviewed information on My[employer] Information Sheet (see scanned document for further details).  Spoke with the patient. Sandra Cuevas, EMT-P  June 01, 2010 12:36 PM      Nuclear Med Background Indications for Stress Test: Evaluation for Ischemia, Surgical Clearance  Indications Comments: Pending shoulder surgery  History: CT/MRI, Echo, Heart Catheterization  History Comments: '01 Heart Cath: NL, EF=65% 6/11 Echo: NL, EF=60-65%  Symptoms: Chest Pain, Chest Pain with Exertion, DOE    Nuclear Pre-Procedure Cardiac Risk Factors: Hypertension, Lipids, NIDDM, RBBB Height (in): 66

## 2010-06-07 ENCOUNTER — Ambulatory Visit (INDEPENDENT_AMBULATORY_CARE_PROVIDER_SITE_OTHER): Payer: Medicare PPO | Admitting: Cardiovascular Disease

## 2010-06-07 DIAGNOSIS — I119 Hypertensive heart disease without heart failure: Secondary | ICD-10-CM

## 2010-06-14 NOTE — Assessment & Plan Note (Signed)
Summary: Cardiology Nuclear Testing  Nuclear Med Background Indications for Stress Test: Evaluation for Ischemia, Surgical Clearance  Indications Comments: Pending (R) shoulder surgery by Dr. Eulah Pont  History: Asthma, Echo, Heart Catheterization  History Comments: '01 Cath:normal, EF=65%; '11 Echo:EF=60-65%  Symptoms: Chest Pain, DOE, Fatigue, Rapid HR    Nuclear Pre-Procedure Cardiac Risk Factors: Family History - CAD, Hypertension, Lipids, NIDDM, Obesity, PVD, RBBB Caffeine/Decaff Intake: None NPO After: 5:00 PM Lungs: Clear.  O2 Sat 95% on RA. IV 0.9% NS with Angio Cath: 20g     IV Site: R Forearm IV Started by: Stanton Kidney, EMT-P Chest Size (in) 42     Cup Size D     Height (in): 66 Weight (lb): 233 BMI: 37.74  Nuclear Med Study 1 or 2 day study:  2 day     Stress Test Type:  Eugenie Birks Reading MD:  Charlton Haws, MD     Referring MD:  Kristeen Miss, MD Resting Radionuclide:  Technetium 43m Tetrofosmin     Resting Radionuclide Dose:  33.0 mCi  Stress Radionuclide:  Technetium 38m Tetrofosmin     Stress Radionuclide Dose:  33.0 mCi   Stress Protocol Exercise Time (min):  2:00 min     Max HR:  114 bpm     Predicted Max HR:  141 bpm  Max Systolic BP: 158 mm Hg     Percent Max HR:  80.85 %Rate Pressure Product:  47829  Lexiscan: 0.4 mg   Stress Test Technologist:  Rea College, CMA-N     Nuclear Technologist:  Doyne Keel, CNMT  Rest Procedure  Myocardial perfusion imaging was performed at rest 45 minutes following the intravenous administration of Technetium 73m Tetrofosmin.  Stress Procedure  The patient received IV Lexiscan 0.4 mg over 15-seconds with concurrent low level exercise and then Technetium 75m Tetrofosmin was injected at 30-seconds.  There were nonspecific T-wave changes with infusion.  Quantitative spect images were obtained after a 45 minute delay.  QPS Raw Data Images:  Normal; no motion artifact; normal heart/lung ratio. Stress Images:  Normal homogeneous  uptake in all areas of the myocardium. Rest Images:  Normal homogeneous uptake in all areas of the myocardium. Subtraction (SDS):  Normal Transient Ischemic Dilatation:  1.02  (Normal <1.22)  Lung/Heart Ratio:  0.34  (Normal <0.45)  Quantitative Gated Spect Images QGS EDV:  94 ml QGS ESV:  27 ml QGS EF:  71 % QGS cine images:  normal  Findings Normal nuclear study      Overall Impression  Exercise Capacity: Lexiscan with no exercise. BP Response: Normal blood pressure response. Clinical Symptoms: Dyspnea ECG Impression: RBBB Overall Impression: Normal stress nuclear study.  Appended Document: Cardiology Nuclear Testing copy sent to Dr. Melburn Popper

## 2010-06-21 ENCOUNTER — Ambulatory Visit (HOSPITAL_BASED_OUTPATIENT_CLINIC_OR_DEPARTMENT_OTHER)
Admission: RE | Admit: 2010-06-21 | Discharge: 2010-06-22 | Disposition: A | Discharge status: Home / Self Care | Payer: Medicare PPO | Source: Ambulatory Visit | Attending: Orthopedic Surgery | Admitting: Orthopedic Surgery

## 2010-06-21 DIAGNOSIS — M19019 Primary osteoarthritis, unspecified shoulder: Secondary | ICD-10-CM | POA: Insufficient documentation

## 2010-06-21 LAB — DIFFERENTIAL
Basophils Relative: 0 % (ref 0–1)
Eosinophils Absolute: 0.1 10*3/uL (ref 0.0–0.7)
Lymphocytes Relative: 21 % (ref 12–46)
Lymphs Abs: 2 10*3/uL (ref 0.7–4.0)
Lymphs Abs: 2.5 10*3/uL (ref 0.7–4.0)
Monocytes Absolute: 1.3 10*3/uL — ABNORMAL HIGH (ref 0.1–1.0)
Monocytes Absolute: 1.5 10*3/uL — ABNORMAL HIGH (ref 0.1–1.0)
Monocytes Relative: 11 % (ref 3–12)
Monocytes Relative: 11 % (ref 3–12)
Neutro Abs: 7.6 10*3/uL (ref 1.7–7.7)
Neutrophils Relative %: 66 % (ref 43–77)
Neutrophils Relative %: 75 % (ref 43–77)

## 2010-06-21 LAB — BASIC METABOLIC PANEL
BUN: 34 mg/dL — ABNORMAL HIGH (ref 6–23)
BUN: 46 mg/dL — ABNORMAL HIGH (ref 6–23)
CO2: 27 mEq/L (ref 19–32)
CO2: 29 mEq/L (ref 19–32)
CO2: 31 mEq/L (ref 19–32)
Calcium: 8.5 mg/dL (ref 8.4–10.5)
Calcium: 8.5 mg/dL (ref 8.4–10.5)
Chloride: 105 mEq/L (ref 96–112)
Chloride: 106 mEq/L (ref 96–112)
Chloride: 107 mEq/L (ref 96–112)
Creatinine, Ser: 1.43 mg/dL — ABNORMAL HIGH (ref 0.4–1.2)
Creatinine, Ser: 1.79 mg/dL — ABNORMAL HIGH (ref 0.4–1.2)
GFR calc Af Amer: 20 mL/min — ABNORMAL LOW (ref >60)
GFR calc Af Amer: 33 mL/min — ABNORMAL LOW (ref >60)
GFR calc Af Amer: 43 mL/min — ABNORMAL LOW (ref >60)
GFR calc non Af Amer: 17 mL/min — ABNORMAL LOW (ref >60)
Glucose, Bld: 144 mg/dL — ABNORMAL HIGH (ref 70–99)
Glucose, Bld: 160 mg/dL — ABNORMAL HIGH (ref 70–99)
Glucose, Bld: 173 mg/dL — ABNORMAL HIGH (ref 70–99)
Sodium: 138 mEq/L (ref 135–145)
Sodium: 139 mEq/L (ref 135–145)

## 2010-06-21 LAB — CBC
HCT: 33 % — ABNORMAL LOW (ref 36.0–46.0)
HCT: 34.2 % — ABNORMAL LOW (ref 36.0–46.0)
HCT: 36 % (ref 36.0–46.0)
Hemoglobin: 10.8 g/dL — ABNORMAL LOW (ref 12.0–15.0)
Hemoglobin: 11.1 g/dL — ABNORMAL LOW (ref 12.0–15.0)
MCH: 28.6 pg (ref 26.0–34.0)
MCH: 29.8 pg (ref 26.0–34.0)
MCH: 30.2 pg (ref 26.0–34.0)
MCHC: 31.5 g/dL (ref 30.0–36.0)
MCHC: 32.2 g/dL (ref 30.0–36.0)
MCHC: 32.5 g/dL (ref 30.0–36.0)
MCHC: 32.7 g/dL (ref 30.0–36.0)
MCV: 90.8 fL (ref 78.0–100.0)
MCV: 91.7 fL (ref 78.0–100.0)
Platelets: 178 10*3/uL (ref 150–400)
Platelets: 203 10*3/uL (ref 150–400)
RBC: 3.7 MIL/uL — ABNORMAL LOW (ref 3.87–5.11)
RBC: 3.73 MIL/uL — ABNORMAL LOW (ref 3.87–5.11)
RDW: 13.6 % (ref 11.5–15.5)
RDW: 13.9 % (ref 11.5–15.5)
WBC: 13.9 10*3/uL — ABNORMAL HIGH (ref 4.0–10.5)

## 2010-06-21 LAB — CARDIAC PANEL(CRET KIN+CKTOT+MB+TROPI)
CK, MB: 1.1 ng/mL (ref 0.3–4.0)
Relative Index: INVALID (ref 0.0–2.5)
Total CK: 50 U/L (ref 7–177)
Troponin I: 0.02 ng/mL (ref 0.00–0.06)
Troponin I: 0.05 ng/mL (ref 0.00–0.06)

## 2010-06-21 LAB — GLUCOSE, CAPILLARY
Glucose-Capillary: 136 mg/dL — ABNORMAL HIGH (ref 70–99)
Glucose-Capillary: 163 mg/dL — ABNORMAL HIGH (ref 70–99)
Glucose-Capillary: 165 mg/dL — ABNORMAL HIGH (ref 70–99)

## 2010-06-21 LAB — COMPREHENSIVE METABOLIC PANEL
ALT: 12 U/L (ref 0–35)
Albumin: 3.4 g/dL — ABNORMAL LOW (ref 3.5–5.2)
Alkaline Phosphatase: 65 U/L (ref 39–117)
Calcium: 9.1 mg/dL (ref 8.4–10.5)
GFR calc Af Amer: 15 mL/min — ABNORMAL LOW (ref >60)
Glucose, Bld: 237 mg/dL — ABNORMAL HIGH (ref 70–99)
Potassium: 5 mEq/L (ref 3.5–5.1)
Sodium: 139 mEq/L (ref 135–145)
Total Protein: 6.1 g/dL (ref 6.0–8.3)

## 2010-06-21 LAB — POCT I-STAT, CHEM 8
BUN: 19 mg/dL (ref 6–23)
Calcium, Ion: 1.14 mmol/L (ref 1.12–1.32)
HCT: 38 % (ref 36.0–46.0)
Hemoglobin: 12.9 g/dL (ref 12.0–15.0)
Sodium: 141 mEq/L (ref 135–145)
TCO2: 30 mmol/L (ref 0–100)

## 2010-06-21 LAB — URINALYSIS, ROUTINE W REFLEX MICROSCOPIC
Glucose, UA: NEGATIVE mg/dL
Hgb urine dipstick: NEGATIVE
Protein, ur: NEGATIVE mg/dL
Specific Gravity, Urine: 1.021 (ref 1.005–1.030)

## 2010-06-21 LAB — URINE CULTURE: Culture  Setup Time: 201109220815

## 2010-06-21 LAB — POCT CARDIAC MARKERS: Myoglobin, poc: 149 ng/mL (ref 12–200)

## 2010-06-21 LAB — URINE MICROSCOPIC-ADD ON

## 2010-06-21 LAB — CREATININE, URINE, RANDOM: Creatinine, Urine: 218.7 mg/dL

## 2010-06-24 LAB — COMPREHENSIVE METABOLIC PANEL
BUN: 39 mg/dL — ABNORMAL HIGH (ref 6–23)
CO2: 27 mEq/L (ref 19–32)
Calcium: 8.5 mg/dL (ref 8.4–10.5)
Creatinine, Ser: 2.85 mg/dL — ABNORMAL HIGH (ref 0.4–1.2)
GFR calc Af Amer: 19 mL/min — ABNORMAL LOW (ref >60)
GFR calc non Af Amer: 16 mL/min — ABNORMAL LOW (ref >60)
Glucose, Bld: 117 mg/dL — ABNORMAL HIGH (ref 70–99)
Total Bilirubin: 0.2 mg/dL — ABNORMAL LOW (ref 0.3–1.2)

## 2010-06-24 LAB — CBC
HCT: 28.4 % — ABNORMAL LOW (ref 36.0–46.0)
HCT: 36.5 % (ref 36.0–46.0)
Hemoglobin: 9.6 g/dL — ABNORMAL LOW (ref 12.0–15.0)
Hemoglobin: 9.8 g/dL — ABNORMAL LOW (ref 12.0–15.0)
MCHC: 33.7 g/dL (ref 30.0–36.0)
MCHC: 33.7 g/dL (ref 30.0–36.0)
MCV: 93.1 fL (ref 78.0–100.0)
MCV: 93.7 fL (ref 78.0–100.0)
MCV: 94.7 fL (ref 78.0–100.0)
Platelets: 136 10*3/uL — ABNORMAL LOW (ref 150–400)
Platelets: 165 10*3/uL (ref 150–400)
RBC: 3.03 MIL/uL — ABNORMAL LOW (ref 3.87–5.11)
RBC: 3.1 MIL/uL — ABNORMAL LOW (ref 3.87–5.11)
RBC: 3.92 MIL/uL (ref 3.87–5.11)
RDW: 12.7 % (ref 11.5–15.5)
WBC: 14.3 10*3/uL — ABNORMAL HIGH (ref 4.0–10.5)
WBC: 9.6 10*3/uL (ref 4.0–10.5)

## 2010-06-24 LAB — BASIC METABOLIC PANEL
BUN: 12 mg/dL (ref 6–23)
BUN: 28 mg/dL — ABNORMAL HIGH (ref 6–23)
CO2: 23 mEq/L (ref 19–32)
Calcium: 8 mg/dL — ABNORMAL LOW (ref 8.4–10.5)
Calcium: 8.1 mg/dL — ABNORMAL LOW (ref 8.4–10.5)
Calcium: 8.2 mg/dL — ABNORMAL LOW (ref 8.4–10.5)
Chloride: 105 mEq/L (ref 96–112)
Chloride: 110 mEq/L (ref 96–112)
Chloride: 111 mEq/L (ref 96–112)
Creatinine, Ser: 1.18 mg/dL (ref 0.4–1.2)
Creatinine, Ser: 1.79 mg/dL — ABNORMAL HIGH (ref 0.4–1.2)
GFR calc Af Amer: 33 mL/min — ABNORMAL LOW (ref >60)
GFR calc Af Amer: 40 mL/min — ABNORMAL LOW (ref >60)
GFR calc Af Amer: 47 mL/min — ABNORMAL LOW (ref >60)
GFR calc non Af Amer: 27 mL/min — ABNORMAL LOW (ref >60)
Potassium: 4.1 mEq/L (ref 3.5–5.1)
Potassium: 4.3 mEq/L (ref 3.5–5.1)
Sodium: 137 mEq/L (ref 135–145)
Sodium: 138 mEq/L (ref 135–145)

## 2010-06-24 LAB — POCT CARDIAC MARKERS
CKMB, poc: <1 ng/mL — ABNORMAL LOW (ref 1.0–8.0)
Troponin i, poc: <0.05 ng/mL (ref 0.00–0.09)
Troponin i, poc: <0.05 ng/mL (ref 0.00–0.09)

## 2010-06-24 LAB — HEMOGLOBIN A1C
Hgb A1c MFr Bld: 7.1 % — ABNORMAL HIGH (ref <5.7)
Mean Plasma Glucose: 157 mg/dL — ABNORMAL HIGH (ref <117)

## 2010-06-24 LAB — TSH: TSH: 2.593 u[IU]/mL (ref 0.350–4.500)

## 2010-06-24 LAB — FERRITIN: Ferritin: 309 ng/mL — ABNORMAL HIGH (ref 10–291)

## 2010-06-24 LAB — CARDIAC PANEL(CRET KIN+CKTOT+MB+TROPI)
CK, MB: 0.9 ng/mL (ref 0.3–4.0)
CK, MB: 0.9 ng/mL (ref 0.3–4.0)
CK, MB: 1.2 ng/mL (ref 0.3–4.0)
Total CK: 109 U/L (ref 7–177)
Total CK: 143 U/L (ref 7–177)
Troponin I: 0.01 ng/mL (ref 0.00–0.06)

## 2010-06-24 LAB — GLUCOSE, CAPILLARY
Glucose-Capillary: 115 mg/dL — ABNORMAL HIGH (ref 70–99)
Glucose-Capillary: 123 mg/dL — ABNORMAL HIGH (ref 70–99)
Glucose-Capillary: 124 mg/dL — ABNORMAL HIGH (ref 70–99)
Glucose-Capillary: 128 mg/dL — ABNORMAL HIGH (ref 70–99)
Glucose-Capillary: 159 mg/dL — ABNORMAL HIGH (ref 70–99)

## 2010-06-24 LAB — DIFFERENTIAL
Basophils Absolute: 0 10*3/uL (ref 0.0–0.1)
Basophils Relative: 1 % (ref 0–1)
Eosinophils Absolute: 0.4 10*3/uL (ref 0.0–0.7)
Eosinophils Absolute: 0.5 10*3/uL (ref 0.0–0.7)
Eosinophils Relative: 3 % (ref 0–5)
Lymphocytes Relative: 14 % (ref 12–46)
Lymphocytes Relative: 20 % (ref 12–46)
Lymphs Abs: 1.4 10*3/uL (ref 0.7–4.0)
Lymphs Abs: 2 10*3/uL (ref 0.7–4.0)
Lymphs Abs: 2.1 10*3/uL (ref 0.7–4.0)
Lymphs Abs: 3.2 10*3/uL (ref 0.7–4.0)
Monocytes Absolute: 1.3 10*3/uL — ABNORMAL HIGH (ref 0.1–1.0)
Monocytes Relative: 12 % (ref 3–12)
Monocytes Relative: 9 % (ref 3–12)
Neutro Abs: 7 10*3/uL (ref 1.7–7.7)
Neutrophils Relative %: 61 % (ref 43–77)
Neutrophils Relative %: 66 % (ref 43–77)
Neutrophils Relative %: 69 % (ref 43–77)

## 2010-06-24 LAB — FOLATE: Folate: >20 ng/mL

## 2010-06-24 LAB — BRAIN NATRIURETIC PEPTIDE: Pro B Natriuretic peptide (BNP): 254 pg/mL — ABNORMAL HIGH (ref 0.0–100.0)

## 2010-06-24 LAB — T4, FREE: Free T4: 1.06 ng/dL (ref 0.80–1.80)

## 2010-06-24 LAB — RENAL FUNCTION PANEL
BUN: 28 mg/dL — ABNORMAL HIGH (ref 6–23)
CO2: 25 mEq/L (ref 19–32)
Chloride: 110 mEq/L (ref 96–112)
Creatinine, Ser: 1.8 mg/dL — ABNORMAL HIGH (ref 0.4–1.2)
Glucose, Bld: 129 mg/dL — ABNORMAL HIGH (ref 70–99)
Potassium: 4.2 mEq/L (ref 3.5–5.1)

## 2010-06-24 LAB — IRON AND TIBC
Iron: 27 ug/dL — ABNORMAL LOW (ref 42–135)
TIBC: 226 ug/dL — ABNORMAL LOW (ref 250–470)
UIBC: 199 ug/dL

## 2010-06-24 LAB — LIPID PANEL: HDL: 48 mg/dL (ref >39)

## 2010-06-24 LAB — HEPATIC FUNCTION PANEL
Bilirubin, Direct: <0.1 mg/dL (ref 0.0–0.3)
Total Bilirubin: 0.4 mg/dL (ref 0.3–1.2)

## 2010-06-24 LAB — LIPASE, BLOOD: Lipase: 52 U/L (ref 11–59)

## 2010-06-28 NOTE — Op Note (Signed)
NAMEREE, Sandra Cuevas  MEDICAL RECORD NO.:  [id number]           PATIENT TYPE:  LOCATION:                                 FACILITY:  PHYSICIAN:  Sandra Cuevas, M.D. DATE OF BIRTH:  30-May-1928  DATE OF PROCEDURE:  06/21/2010 DATE OF DISCHARGE:                              OPERATIVE REPORT   PREOPERATIVE DIAGNOSIS:  Right shoulder end-stage degenerative arthritis.  POSTOPERATIVE DIAGNOSIS:  Right shoulder end-stage degenerative arthritis.  PROCEDURE:  Right total shoulder replacement utilizing a Stryker prosthesis.  A cemented pegged #5 glenoid component.  A Press-Fit 13-mm humeral component with a 45- x 18-mm head.  SURGEON:  Sandra Ave, MD  ASSISTANT:  Genene Churn. Barry Dienes, [place], present throughout the entire case and necessary for timely completion of procedure.  ANESTHESIA:  General.  BLOOD LOSS:  Less than 50 mL.  BLOOD GIVEN:  None.  SPECIMENS:  None.  CULTURES:  None.  COMPLICATIONS:  None.  DRESSING:  Soft compressive with shoulder immobilizer.  PROCEDURE:  The patient was brought to the operating room, and after adequate anesthesia had been obtained placed in a beach-chair position on the shoulder positioner, prepped and draped in usual sterile fashion. Incision along the deltopectoral interval.  Skin and subcutaneous tissue divided.  The cephalic vein identified and protected.  Retractors put in place.  Conjoined tendon elevated.  Subscap was then taken down anatomically from lateral to medial exposing the shoulder.  Moderate effusion.  The humerus was cut in line with the definitive prosthesis at 30 degrees of retroversion and the head removed with marked grade 4 change throughout.  Periarticular spurs debrided.  The glenoid was then exposed with grade 4 changes.  Brought up to good bleeding bone, sized for #5 component.  Drill holes for the component were made.  Trial put in place and fit well.  Wound was  copiously irrigated.  Cement prepared in the glenoid component firmly cemented in place in the glenoid.  All excessive cement removed.  Once I confirmed good placement and cementing of the glenoid component, attention was turned to the humerus.  The bone in the proximal humerus was extremely osteopenic but good bone distally. Utilizing the broaches and reamers all done by hand, the humerus was brought up to good sizing and fitting with the 13-mm stem.  A 30 degrees of retroversion.  Appropriate trials complete.  Once that was complete, the definitive component was seated down into the humerus.  The 45- x 18- mm head was attached and the shoulder reduced.  Very nice covering and fitting with this construct.  Rotator cuff and biceps tendon intact throughout.  Shoulder was able to be brought to full motion with components in place.  Wound irrigated again.  Subscap repaired anatomically.  Retractors removed.  Wound closed with subcutaneous and subcuticular Vicryl and staples along the border.  Sterile compressive dressing applied.  Shoulder immobilizer applied.  Anesthesia reversed. Brought to recovery room.  Tolerated surgery well.  No complications.     Sandra Cuevas, M.D.     DFM/MEDQ  D:  06/21/2010  T:  06/22/2010  Job:  [id number]  Electronically Signed by Mckinley Jewel M.D. on 06/28/2010 11:48:58 AM

## 2010-07-10 ENCOUNTER — Ambulatory Visit (HOSPITAL_COMMUNITY)
Admission: RE | Admit: 2010-07-10 | Discharge: 2010-07-10 | Disposition: A | Discharge status: Home / Self Care | Payer: Medicare PPO | Source: Ambulatory Visit | Attending: Orthopedic Surgery | Admitting: Orthopedic Surgery

## 2010-07-10 DIAGNOSIS — M6281 Muscle weakness (generalized): Secondary | ICD-10-CM | POA: Insufficient documentation

## 2010-07-10 DIAGNOSIS — IMO0001 Reserved for inherently not codable concepts without codable children: Secondary | ICD-10-CM | POA: Insufficient documentation

## 2010-07-10 DIAGNOSIS — M25519 Pain in unspecified shoulder: Secondary | ICD-10-CM | POA: Insufficient documentation

## 2010-07-10 DIAGNOSIS — M25619 Stiffness of unspecified shoulder, not elsewhere classified: Secondary | ICD-10-CM | POA: Insufficient documentation

## 2010-07-16 ENCOUNTER — Ambulatory Visit (HOSPITAL_COMMUNITY)
Admission: RE | Admit: 2010-07-16 | Discharge: 2010-07-16 | Disposition: A | Discharge status: Home / Self Care | Payer: Medicare PPO | Source: Ambulatory Visit | Attending: *Deleted | Admitting: *Deleted

## 2010-07-18 ENCOUNTER — Ambulatory Visit (HOSPITAL_COMMUNITY)
Admission: RE | Admit: 2010-07-18 | Discharge: 2010-07-18 | Disposition: A | Discharge status: Home / Self Care | Payer: Medicare PPO | Source: Ambulatory Visit | Attending: *Deleted | Admitting: *Deleted

## 2010-07-25 ENCOUNTER — Ambulatory Visit (HOSPITAL_COMMUNITY)
Admission: RE | Admit: 2010-07-25 | Discharge: 2010-07-25 | Disposition: A | Discharge status: Home / Self Care | Payer: Medicare PPO | Source: Ambulatory Visit | Attending: Family Medicine | Admitting: Family Medicine

## 2010-07-27 ENCOUNTER — Ambulatory Visit (HOSPITAL_COMMUNITY)
Admission: RE | Admit: 2010-07-27 | Discharge: 2010-07-27 | Disposition: A | Discharge status: Home / Self Care | Payer: Medicare PPO | Source: Ambulatory Visit | Admitting: Occupational Therapy

## 2010-07-30 ENCOUNTER — Ambulatory Visit (HOSPITAL_COMMUNITY)
Admission: RE | Admit: 2010-07-30 | Discharge: 2010-07-30 | Disposition: A | Discharge status: Home / Self Care | Payer: Medicare PPO | Source: Ambulatory Visit | Attending: Family Medicine | Admitting: Family Medicine

## 2010-08-02 ENCOUNTER — Ambulatory Visit (HOSPITAL_COMMUNITY)
Admission: RE | Admit: 2010-08-02 | Discharge: 2010-08-02 | Disposition: A | Discharge status: Home / Self Care | Payer: Medicare PPO | Source: Ambulatory Visit | Attending: Family Medicine | Admitting: Family Medicine

## 2010-08-06 ENCOUNTER — Other Ambulatory Visit: Payer: Self-pay | Admitting: *Deleted

## 2010-08-06 DIAGNOSIS — Z79899 Other long term (current) drug therapy: Secondary | ICD-10-CM

## 2010-08-07 ENCOUNTER — Ambulatory Visit (HOSPITAL_COMMUNITY)
Admission: RE | Admit: 2010-08-07 | Discharge: 2010-08-07 | Disposition: A | Discharge status: Home / Self Care | Payer: Medicare PPO | Source: Ambulatory Visit | Attending: Family Medicine | Admitting: Family Medicine

## 2010-08-07 DIAGNOSIS — IMO0001 Reserved for inherently not codable concepts without codable children: Secondary | ICD-10-CM | POA: Insufficient documentation

## 2010-08-07 DIAGNOSIS — M6281 Muscle weakness (generalized): Secondary | ICD-10-CM | POA: Insufficient documentation

## 2010-08-07 DIAGNOSIS — M25519 Pain in unspecified shoulder: Secondary | ICD-10-CM | POA: Insufficient documentation

## 2010-08-07 DIAGNOSIS — M25619 Stiffness of unspecified shoulder, not elsewhere classified: Secondary | ICD-10-CM | POA: Insufficient documentation

## 2010-08-08 ENCOUNTER — Ambulatory Visit (HOSPITAL_COMMUNITY)
Admission: RE | Admit: 2010-08-08 | Discharge: 2010-08-08 | Disposition: A | Discharge status: Home / Self Care | Payer: Medicare PPO | Source: Ambulatory Visit | Attending: Family Medicine | Admitting: Family Medicine

## 2010-08-14 ENCOUNTER — Ambulatory Visit (HOSPITAL_COMMUNITY)
Admission: RE | Admit: 2010-08-14 | Discharge: 2010-08-14 | Disposition: A | Discharge status: Home / Self Care | Payer: Medicare PPO | Source: Ambulatory Visit | Attending: Family Medicine | Admitting: Family Medicine

## 2010-08-16 ENCOUNTER — Other Ambulatory Visit: Payer: Self-pay | Admitting: *Deleted

## 2010-08-16 ENCOUNTER — Ambulatory Visit (HOSPITAL_COMMUNITY)
Admission: RE | Admit: 2010-08-16 | Discharge: 2010-08-16 | Disposition: A | Discharge status: Home / Self Care | Payer: Medicare PPO | Source: Ambulatory Visit | Attending: Family Medicine | Admitting: Family Medicine

## 2010-08-16 MED ORDER — HYDROCHLOROTHIAZIDE 25 MG PO TABS: 25.0000 mg | ORAL_TABLET | Freq: Every day | ORAL | Status: DC

## 2010-08-16 NOTE — Telephone Encounter (Signed)
Fax received from pharmacy. Refill completed. Jodette Darlys Buis RN  

## 2010-08-20 ENCOUNTER — Other Ambulatory Visit: Payer: Self-pay | Admitting: *Deleted

## 2010-08-20 MED ORDER — HYDROCHLOROTHIAZIDE 25 MG PO TABS: 25.0000 mg | ORAL_TABLET | Freq: Every day | ORAL | Status: DC

## 2010-08-21 ENCOUNTER — Ambulatory Visit (HOSPITAL_COMMUNITY)
Admission: RE | Admit: 2010-08-21 | Discharge: 2010-08-21 | Disposition: A | Discharge status: Home / Self Care | Payer: Medicare PPO | Source: Ambulatory Visit | Attending: Family Medicine | Admitting: Family Medicine

## 2010-08-21 ENCOUNTER — Encounter: Payer: Self-pay | Admitting: *Deleted

## 2010-08-21 DIAGNOSIS — E119 Type 2 diabetes mellitus without complications: Secondary | ICD-10-CM | POA: Insufficient documentation

## 2010-08-21 DIAGNOSIS — I10 Essential (primary) hypertension: Secondary | ICD-10-CM

## 2010-08-21 DIAGNOSIS — E785 Hyperlipidemia, unspecified: Secondary | ICD-10-CM

## 2010-08-21 DIAGNOSIS — R0789 Other chest pain: Secondary | ICD-10-CM

## 2010-08-21 DIAGNOSIS — E039 Hypothyroidism, unspecified: Secondary | ICD-10-CM

## 2010-08-22 ENCOUNTER — Ambulatory Visit (INDEPENDENT_AMBULATORY_CARE_PROVIDER_SITE_OTHER): Payer: Medicare PPO | Admitting: Cardiovascular Disease

## 2010-08-22 ENCOUNTER — Encounter: Payer: Self-pay | Admitting: Cardiovascular Disease

## 2010-08-22 ENCOUNTER — Other Ambulatory Visit (INDEPENDENT_AMBULATORY_CARE_PROVIDER_SITE_OTHER): Payer: Medicare PPO | Admitting: *Deleted

## 2010-08-22 VITALS — BP 136/88 | HR 62 | Ht 67.0 in | Wt 231.8 lb

## 2010-08-22 DIAGNOSIS — Z79899 Other long term (current) drug therapy: Secondary | ICD-10-CM

## 2010-08-22 DIAGNOSIS — R0789 Other chest pain: Secondary | ICD-10-CM

## 2010-08-22 DIAGNOSIS — I10 Essential (primary) hypertension: Secondary | ICD-10-CM

## 2010-08-22 DIAGNOSIS — J40 Bronchitis, not specified as acute or chronic: Secondary | ICD-10-CM

## 2010-08-22 MED ORDER — ALBUTEROL SULFATE HFA 108 (90 BASE) MCG/ACT IN AERS: 2.0000 | INHALATION_SPRAY | Freq: Four times a day (QID) | RESPIRATORY_TRACT | Status: DC | PRN

## 2010-08-22 MED ORDER — CARVEDILOL 25 MG PO TABS: 25.0000 mg | ORAL_TABLET | Freq: Two times a day (BID) | ORAL | Status: DC

## 2010-08-22 MED ORDER — HYDROCHLOROTHIAZIDE 25 MG PO TABS: 25.0000 mg | ORAL_TABLET | Freq: Every day | ORAL | Status: DC

## 2010-08-22 NOTE — Progress Notes (Signed)
Sallee Provencal Date of Birth  [birth year] [facility] [address]     [address] [place], [place]  [address] [phone]  Fax  [phone]  History of Present Illness:  Mrs. Hodapp is an elderly female with a history of atypical chest pain. She has done fairly well. She's not had any chest pain recently.  Current Outpatient Prescriptions on File Prior to Visit  Medication Sig Dispense Refill  . amLODipine (NORVASC) 5 MG tablet 1 tablet Daily.      Marland Kitchen aspirin 81 MG tablet Take 81 mg by mouth daily.        Marland Kitchen atorvastatin (LIPITOR) 10 MG tablet 1 tablet Daily.      Marland Kitchen BYSTOLIC 20 MG TABS 1 tablet Daily.      Tery Sanfilippo Calcium (STOOL SOFTENER PO) Take 2 tablets by mouth daily.        . hydrochlorothiazide 25 MG tablet Take 1 tablet (25 mg total) by mouth daily.  90 tablet  3  . omeprazole (PRILOSEC) 20 MG capsule Take 20 mg by mouth 2 (two) times daily.        . ONE TOUCH ULTRA TEST test strip as directed.      . polycarbophil (FIBERCON) 625 MG tablet Take 625 mg by mouth daily.        . potassium chloride (MICRO-K) 10 MEQ CR capsule 1 tablet Daily.      . sitaGLIPtan-metformin (JANUMET) 50-500 MG per tablet Take 1 tablet by mouth 2 (two) times daily with a meal.        . DISCONTD: guanFACINE (TENEX) 1 MG tablet 1 tablet Daily.        No Known Allergies  Past Medical History  Diagnosis Date  . Hyperlipidemia   . Hypertension   . Atypical chest pain   . Hypothyroidism   . Diabetes mellitus   . History of cardiovascular stress test 06/01/2010    EF 71% - no evidence of ischemia, normal left ventricular systolic function  . Shortness of breath     on exertion  . History of hysterectomy 1965    Past Surgical History  Procedure Date  . Cardiac catheterization 2001    EF=65%  . Hemorrhoid surgery     History  Smoking status  . Never Smoker   Smokeless tobacco  . Never Used    History  Alcohol Use No    Family History  Problem  Relation Age of Onset  . Heart disease Father     heart problems  . Kidney failure Mother   . Diabetes Mother   . Cancer Brother   . Diabetes Brother     Reviw of Systems:  Reviewed in the HPI.  All other systems are negative.  Physical Exam: BP 136/88  Pulse 62  Ht 5\' 7"  (1.702 m)  Wt 231 lb 12.8 oz (105.144 kg)  BMI 36.31 kg/m2 The patient is alert and oriented x 3.  The mood and affect are normal.  The skin is warm and dry.  Color is normal.  The HEENT exam reveals that the sclera are nonicteric.  The mucous membranes are moist.  The carotids are 2+ without bruits.  There is no thyromegaly.  There is no JVD.  The lungs are clear.  The chest wall is non tender.  The heart exam reveals a regular rate with a normal S1 and S2.  There are no murmurs, gallops, or rubs.  The PMI is not  displaced.   Abdominal exam reveals good bowel sounds.  There is no guarding or rebound.  There is no hepatosplenomegaly or tenderness.  There are no masses.  Exam of the legs reveal no clubbing, cyanosis, or edema.  The legs are without rashes.  The distal pulses are intact.  Cranial nerves II - XII are intact.  Motor and sensory functions are intact.  The gait is normal.  Assessment / Plan:

## 2010-08-23 ENCOUNTER — Ambulatory Visit (HOSPITAL_COMMUNITY)
Admission: RE | Admit: 2010-08-23 | Discharge: 2010-08-23 | Disposition: A | Discharge status: Home / Self Care | Payer: Medicare PPO | Source: Ambulatory Visit | Attending: Family Medicine | Admitting: Family Medicine

## 2010-08-23 LAB — BASIC METABOLIC PANEL
CO2: 36 mEq/L — ABNORMAL HIGH (ref 19–32)
GFR: 79 mL/min (ref >60.00)
Glucose, Bld: 62 mg/dL — ABNORMAL LOW (ref 70–99)
Potassium: 3.6 mEq/L (ref 3.5–5.1)
Sodium: 135 mEq/L (ref 135–145)

## 2010-08-23 NOTE — Assessment & Plan Note (Addendum)
Sandra Cuevas seems to be doing fairly well. She's had a cold recently. She is not having chest pain.

## 2010-08-24 NOTE — Progress Notes (Signed)
msg left with low sugar results and contact pcp.

## 2010-08-24 NOTE — Op Note (Signed)
Sandra Cuevas, Sandra Cuevas                 ACCOUNT NO.:  [id number]   MEDICAL RECORD NO.:  [id number]          PATIENT TYPE:  AMB   LOCATION:  ENDO                         FACILITY:  [facility]   PHYSICIAN:  Anselmo Rod, M.D.  DATE OF BIRTH:  28-Dec-1928   DATE OF PROCEDURE:  06/26/2005  DATE OF DISCHARGE:                                 OPERATIVE REPORT   PROCEDURE PERFORMED:  Colonoscopy changed to flexible sigmoidoscopy.   ENDOSCOPIST:  Charna Elizabeth, M.D.   INSTRUMENT USED:  Olympus video colonoscope.   INDICATIONS FOR PROCEDURE:  A 77 year old female with family history of  colon cancer in her sister undergoing a colonoscopy to rule out colonic  polyps, masses, etc.   PREPROCEDURE PREPARATION:  Informed consent was procured from the patient.  The patient was fasted for four hours prior to the procedure and prepped  with OsmoPrep pills the night of and the morning of the procedure.  The  risks and benefits of the procedure including a 10% miss rate for cancer or  polyps was discussed with the patient as well.   PREPROCEDURE PHYSICAL:  The patient had stable vital signs.  Neck supple.  Chest clear to auscultation.  S1 and S2 regular.  Abdomen soft with normal  bowel sounds.   DESCRIPTION OF PROCEDURE:  The patient was placed in left lateral decubitus  position and sedated with an additional 20 mcg of fentanyl and once the  patient was adequately sedated, the Olympus video colonoscope was advanced  from the rectum to 20 cm.  There was solid stool in the colon and the  procedure was aborted at this time with plans to reprep and redo the  procedure at a later date.   IMPRESSION:  Large amount of solid stool in the rectum and rectosigmoid  colon.  Procedure aborted.   RECOMMENDATIONS:  The patient will be reprepped with GoLytely today.  The  procedure will be done again tomorrow by Dr. Elnoria Howard.      Anselmo Rod, M.D.  Electronically Signed     JNM/MEDQ  D:  06/26/2005  T:   06/27/2005  Job:  [id number]   cc:   Quita Skye. Artis Flock, M.D.  Fax: [phone]

## 2010-08-24 NOTE — Op Note (Signed)
[name], Sandra Cuevas                 ACCOUNT NO.:  [id number]   MEDICAL RECORD NO.:  [id number]          PATIENT TYPE:  AMB   LOCATION:  ENDO                         FACILITY:  [facility]   PHYSICIAN:  Anselmo Rod, M.D.  DATE OF BIRTH:  [birth year]   DATE OF PROCEDURE:  06/26/2005  DATE OF DISCHARGE:                                 OPERATIVE REPORT   PROCEDURE PERFORMED:  Esophagogastroduodenoscopy with biopsies.   ENDOSCOPIST:  Charna Elizabeth, M.D.   INSTRUMENT USED:  Olympus video panendoscope.   INDICATIONS FOR PROCEDURE:  The patient is a 77 year old white female with a  history of dysphagia undergoing EGD, rule out esophagitis, strictures,  Barrett's mucosa, etc.   PREPROCEDURE PREPARATION:  Informed consent was procured from the patient.  The patient was fasted for four hours prior to the procedure and prepped  with OsmoPrep pills the night of and the morning of the procedure.  The  risks and benefits of the procedure were discussed with her in great detail.   PREPROCEDURE PHYSICAL:  The patient had stable vital signs.  Neck supple,  chest clear to auscultation.  S1, S2 regular.  Abdomen soft with normal  bowel sounds.   DESCRIPTION OF PROCEDURE:  The patient was placed in the left lateral  decubitus position and sedated with 80 mcg of fentanyl and 6 mg of Versed in  slow incremental doses.  Once the patient was adequately sedated and  maintained on low-flow oxygen and continuous cardiac monitoring, the Olympus  video panendoscope was advanced through the mouth piece over the tongue into  the esophagus under direct vision.  The entire esophagus appeared normal  with no evidence of ring, stricture, masses, esophagitis or Barrett's  mucosa.  The scope was then advanced to the stomach.  Diffuse gastritis was  noted with a few small sessile polyps in the proximal stomach.  Biopsies  were done to rule out presence of Helicobacter pylori by pathology.  The  proximal small bowel  appeared normal.  Retroflexion in the high cardia  revealed no abnormalities.   IMPRESSION:  1.  Normal-appearing esophagus.  2.  Diffuse gastritis with few small sessile gastric polyps seen.  Biopsies      done.  3.  Normal proximal small bowel.   RECOMMENDATIONS:  1.  Await pathology results.  2.  Avoid all nonsteroidals including aspirin for now.  3.  Proceed with colonoscopy at this time further recommendations will be      made thereafter.      Anselmo Rod, M.D.  Electronically Signed     JNM/MEDQ  D:  06/26/2005  T:  06/27/2005  Job:  [id number]   cc:   Quita Skye. Artis Flock, M.D.  Fax: [phone]

## 2010-08-24 NOTE — Cardiovascular Report (Signed)
[place]  Patient:    Sandra Cuevas, Sandra Cuevas                        MRN: [id number] Proc. Date: 02/15/00 Adm. Date:  [id number] Disc. Date: [id number] Attending:  Daisey Must CC:         Jesse Sans. Daleen Squibb, M.D. [facility]  Fayrene Fearing D. Artis Flock, M.D.  Cardiac Catheterization Laboratory   Cardiac Catheterization  PROCEDURES PERFORMED:  Left heart catheterization with coronary angiography, left ventriculography, and abdominal aortography.  INDICATIONS:  [name] is a 77 year old woman who has multiple cardiovascular risk factors.  She has been experiencing exertional dyspnea and shoulder pain consistent with possible angina.  She also has a history of renal artery angioplasty approximately 12 years ago.  She was referred by Dr. Daleen Squibb for cardiac catheterization.  DESCRIPTION OF PROCEDURE:  A 6 French sheath was placed in the right femoral artery.  Standard Judkins 6 French catheters were utilized.  The patient had mild spasm of the proximal right coronary artery secondary to catheter engagement.  This was relieved with intracoronary nitroglycerin.  There were no complications.  CATHETERIZATION RESULTS:  HEMODYNAMICS:  Left ventricular pressure 100/15.  Aortic pressure 100/60. No aortic valve gradient.  LEFT VENTRICULOGRAM:  Wall motion is normal.  Ejection fraction is estimated at greater than 65%.  No mitral regurgitation.  ABDOMINAL AORTOGRAM:  Abdominal aortogram reveals bilateral fibromuscular dysplasia of the renal arteries which appears to be nonobstructive.  There is a 40% stenosis at the origin of the right renal artery and a 30% stenosis in the proximal left renal artery.  Abdominal aorta and iliac arteries are free of significant disease.  CORONARY ARTERIOGRAPHY:  (Right dominant).  Left main:  Normal.  Left anterior descending:  The left anterior descending artery gives rise to a small diagonal branch.  The LAD is free of angiographic  disease.  Left circumflex:  The left circumflex gives rise to a small ramus intermedius, small first marginal, and normal sized second and third marginal branches. The left circumflex is free of angiographic disease.  Right coronary artery:  The right coronary artery gives rise to a normal sized posterior descending artery and a small posterolateral branch.  There is catheter-induced spasm in the ostium of the right coronary artery which was relieved with nitroglycerin.  Otherwise the right coronary artery was free of angiographic disease.  IMPRESSION: 1. Normal left ventricular systolic function. 2. Angiographically normal coronary arteries. 3. 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 DD:  02/15/00 TD:  02/16/00 Job: [id number]

## 2010-08-28 ENCOUNTER — Ambulatory Visit (HOSPITAL_COMMUNITY)
Admission: RE | Admit: 2010-08-28 | Discharge: 2010-08-28 | Disposition: A | Discharge status: Home / Self Care | Payer: Medicare PPO | Source: Ambulatory Visit | Attending: Family Medicine | Admitting: Family Medicine

## 2010-08-30 ENCOUNTER — Ambulatory Visit (HOSPITAL_COMMUNITY)
Admission: RE | Admit: 2010-08-30 | Discharge: 2010-08-30 | Disposition: A | Discharge status: Home / Self Care | Payer: Medicare PPO | Source: Ambulatory Visit | Attending: Family Medicine | Admitting: Family Medicine

## 2010-09-04 ENCOUNTER — Ambulatory Visit (HOSPITAL_COMMUNITY)
Admission: RE | Admit: 2010-09-04 | Discharge: 2010-09-04 | Disposition: A | Discharge status: Home / Self Care | Payer: Medicare PPO | Source: Ambulatory Visit | Attending: Family Medicine | Admitting: Family Medicine

## 2010-09-06 ENCOUNTER — Ambulatory Visit (HOSPITAL_COMMUNITY)
Admission: RE | Admit: 2010-09-06 | Discharge: 2010-09-06 | Disposition: A | Discharge status: Home / Self Care | Payer: Medicare PPO | Source: Ambulatory Visit | Attending: Family Medicine | Admitting: Family Medicine

## 2010-09-11 ENCOUNTER — Ambulatory Visit (HOSPITAL_COMMUNITY)
Admission: RE | Admit: 2010-09-11 | Discharge: 2010-09-11 | Disposition: A | Discharge status: Home / Self Care | Payer: Medicare PPO | Source: Ambulatory Visit | Attending: Family Medicine | Admitting: Family Medicine

## 2010-09-11 DIAGNOSIS — IMO0001 Reserved for inherently not codable concepts without codable children: Secondary | ICD-10-CM | POA: Insufficient documentation

## 2010-09-11 DIAGNOSIS — M25519 Pain in unspecified shoulder: Secondary | ICD-10-CM | POA: Insufficient documentation

## 2010-09-11 DIAGNOSIS — M6281 Muscle weakness (generalized): Secondary | ICD-10-CM | POA: Insufficient documentation

## 2010-09-11 DIAGNOSIS — M25619 Stiffness of unspecified shoulder, not elsewhere classified: Secondary | ICD-10-CM | POA: Insufficient documentation

## 2010-09-13 ENCOUNTER — Ambulatory Visit (HOSPITAL_COMMUNITY)
Admission: RE | Admit: 2010-09-13 | Discharge: 2010-09-13 | Disposition: A | Discharge status: Home / Self Care | Payer: Medicare PPO | Source: Ambulatory Visit | Attending: Family Medicine | Admitting: Family Medicine

## 2010-09-18 ENCOUNTER — Ambulatory Visit (HOSPITAL_COMMUNITY): Payer: Medicare PPO | Admitting: Specialist

## 2010-09-20 ENCOUNTER — Ambulatory Visit (HOSPITAL_COMMUNITY)
Admission: RE | Admit: 2010-09-20 | Discharge: 2010-09-20 | Disposition: A | Discharge status: Home / Self Care | Payer: Medicare PPO | Source: Ambulatory Visit | Attending: Family Medicine | Admitting: Family Medicine

## 2010-09-25 ENCOUNTER — Ambulatory Visit (HOSPITAL_COMMUNITY)
Admission: RE | Admit: 2010-09-25 | Discharge: 2010-09-25 | Disposition: A | Discharge status: Home / Self Care | Payer: Medicare PPO | Source: Ambulatory Visit | Attending: Family Medicine | Admitting: Family Medicine

## 2010-09-27 ENCOUNTER — Ambulatory Visit (HOSPITAL_COMMUNITY)
Admission: RE | Admit: 2010-09-27 | Discharge: 2010-09-27 | Disposition: A | Discharge status: Home / Self Care | Payer: Medicare PPO | Source: Ambulatory Visit | Attending: Family Medicine | Admitting: Family Medicine

## 2010-10-01 ENCOUNTER — Encounter: Payer: Self-pay | Admitting: Cardiovascular Disease

## 2010-10-01 ENCOUNTER — Ambulatory Visit (INDEPENDENT_AMBULATORY_CARE_PROVIDER_SITE_OTHER): Payer: Medicare PPO | Admitting: Cardiovascular Disease

## 2010-10-01 VITALS — BP 138/76 | HR 76 | Ht 66.5 in | Wt 234.8 lb

## 2010-10-01 DIAGNOSIS — I10 Essential (primary) hypertension: Secondary | ICD-10-CM

## 2010-10-01 NOTE — Progress Notes (Signed)
Sandra Cuevas Date of Birth  September 28, 1928 [facility] [address]     [address] [place], [place]  [address] [phone]  Fax  [phone]  History of Present Illness:  No chest pain.  Has back pain that is worsened by positional changes and by moving around.  BP is well controlled .  She did well on Bystolic but it was too expensive.  Exercises only a very little due to fatigue.   Current Outpatient Prescriptions on File Prior to Visit  Medication Sig Dispense Refill  . albuterol (PROVENTIL HFA;VENTOLIN HFA) 108 (90 BASE) MCG/ACT inhaler Inhale 2 puffs into the lungs every 6 (six) hours as needed for wheezing.  1 Inhaler  12  . amLODipine (NORVASC) 5 MG tablet 1 tablet Daily.      Marland Kitchen aspirin 81 MG tablet Take 81 mg by mouth daily.        Marland Kitchen atorvastatin (LIPITOR) 10 MG tablet 1 tablet Daily.      . carvedilol (COREG) 25 MG tablet Take 1 tablet (25 mg total) by mouth 2 (two) times daily.  60 tablet  11  . Docusate Calcium (STOOL SOFTENER PO) Take 2 tablets by mouth daily.        Marland Kitchen guaiFENesin (MUCINEX) 600 MG 12 hr tablet Take 1,200 mg by mouth 2 (two) times daily.        . hydrochlorothiazide 25 MG tablet Take 1 tablet (25 mg total) by mouth daily.  90 tablet  3  . levothyroxine (SYNTHROID, LEVOTHROID) 50 MCG tablet Take 50 mcg by mouth daily.        Marland Kitchen omeprazole (PRILOSEC) 20 MG capsule Take 20 mg by mouth 2 (two) times daily.        . ONE TOUCH ULTRA TEST test strip as directed.      . polycarbophil (FIBERCON) 625 MG tablet Take 625 mg by mouth daily.        . potassium chloride (MICRO-K) 10 MEQ CR capsule 1 tablet Daily.      . sitaGLIPtan-metformin (JANUMET) 50-500 MG per tablet Take 1 tablet by mouth 2 (two) times daily with a meal.          No Known Allergies  Past Medical History  Diagnosis Date  . Hyperlipidemia   . Hypertension   . Atypical chest pain   . Hypothyroidism   . Diabetes mellitus   . History of cardiovascular stress  test 06/01/2010    EF 71% - no evidence of ischemia, normal left ventricular systolic function  . Shortness of breath     on exertion  . History of hysterectomy 1965    Past Surgical History  Procedure Date  . Cardiac catheterization 2001    EF=65%  . Hemorrhoid surgery     History  Smoking status  . Never Smoker   Smokeless tobacco  . Never Used    History  Alcohol Use No    Family History  Problem Relation Age of Onset  . Heart disease Father     heart problems  . Kidney failure Mother   . Diabetes Mother   . Cancer Brother   . Diabetes Brother     Reviw of Systems:  Reviewed in the HPI.  All other systems are negative.  Physical Exam: BP 138/76  Pulse 76  Ht 5' 6.5" (1.689 m)  Wt 234 lb 12.8 oz (106.505 kg)  BMI 37.33 kg/m2  SpO2 96% The patient is alert and oriented  x 3.  The mood and affect are normal.   Skin: warm and dry.  Color is normal.    HEENT:   the sclera are nonicteric.  The mucous membranes are moist.  The carotids are 2+ without bruits.  There is no thyromegaly.  There is no JVD.    Lungs: clear.  The chest wall is non tender.    Heart: regular rate with a normal S1 and S2.  There are no murmurs, gallops, or rubs. The PMI is not displaced.     Abdomin: good bowel sounds.  There is no guarding or rebound.  There is no hepatosplenomegaly or tenderness.  There are no masses.   Extremities:  1+ amlodipine.  The legs are without rashes.  The distal pulses are intact.   Neuro:  Cranial nerves II - XII are intact.  Motor and sensory functions are intact.    The gait is normal.  ECG:  Assessment / Plan:

## 2010-10-01 NOTE — Assessment & Plan Note (Signed)
[name] blood pressure is well controlled today. We'll continue with her same medications

## 2010-10-01 NOTE — Patient Instructions (Signed)
Walk every day. Stick to a good diet in an effort to lose additional weight.

## 2010-10-02 ENCOUNTER — Ambulatory Visit (HOSPITAL_COMMUNITY): Payer: Medicare PPO | Admitting: Specialist

## 2010-10-04 ENCOUNTER — Ambulatory Visit (HOSPITAL_COMMUNITY)
Admission: RE | Admit: 2010-10-04 | Discharge: 2010-10-04 | Disposition: A | Discharge status: Home / Self Care | Payer: Medicare PPO | Source: Ambulatory Visit | Attending: Family Medicine | Admitting: Family Medicine

## 2010-10-09 ENCOUNTER — Ambulatory Visit (HOSPITAL_COMMUNITY): Payer: Medicare PPO | Admitting: Specialist

## 2010-10-11 ENCOUNTER — Ambulatory Visit (HOSPITAL_COMMUNITY)
Admission: RE | Admit: 2010-10-11 | Discharge: 2010-10-11 | Disposition: A | Discharge status: Home / Self Care | Payer: Medicare PPO | Source: Ambulatory Visit | Attending: Family Medicine | Admitting: Family Medicine

## 2010-10-11 DIAGNOSIS — IMO0001 Reserved for inherently not codable concepts without codable children: Secondary | ICD-10-CM | POA: Insufficient documentation

## 2010-10-11 DIAGNOSIS — M6281 Muscle weakness (generalized): Secondary | ICD-10-CM | POA: Insufficient documentation

## 2010-10-11 DIAGNOSIS — M25519 Pain in unspecified shoulder: Secondary | ICD-10-CM | POA: Insufficient documentation

## 2010-10-11 DIAGNOSIS — M25619 Stiffness of unspecified shoulder, not elsewhere classified: Secondary | ICD-10-CM | POA: Insufficient documentation

## 2010-10-16 ENCOUNTER — Ambulatory Visit (HOSPITAL_COMMUNITY): Payer: Medicare PPO | Admitting: Occupational Therapy

## 2010-10-16 ENCOUNTER — Telehealth (HOSPITAL_COMMUNITY): Payer: Self-pay | Admitting: Occupational Therapy

## 2010-10-16 NOTE — Telephone Encounter (Signed)
Patient called to cancel her appointment for 10/16/10 and 10/18/10 secondary to back pain.

## 2010-10-18 ENCOUNTER — Ambulatory Visit (HOSPITAL_BASED_OUTPATIENT_CLINIC_OR_DEPARTMENT_OTHER)
Admission: RE | Admit: 2010-10-18 | Discharge: 2010-10-18 | Disposition: A | Discharge status: Home / Self Care | Payer: Medicare PPO | Source: Ambulatory Visit | Attending: Orthopedic Surgery | Admitting: Orthopedic Surgery

## 2010-10-18 ENCOUNTER — Ambulatory Visit (HOSPITAL_COMMUNITY): Payer: Medicare PPO | Admitting: Occupational Therapy

## 2010-10-18 DIAGNOSIS — K219 Gastro-esophageal reflux disease without esophagitis: Secondary | ICD-10-CM | POA: Insufficient documentation

## 2010-10-18 DIAGNOSIS — E039 Hypothyroidism, unspecified: Secondary | ICD-10-CM | POA: Insufficient documentation

## 2010-10-18 DIAGNOSIS — G5600 Carpal tunnel syndrome, unspecified upper limb: Secondary | ICD-10-CM | POA: Insufficient documentation

## 2010-10-18 DIAGNOSIS — E119 Type 2 diabetes mellitus without complications: Secondary | ICD-10-CM | POA: Insufficient documentation

## 2010-10-18 DIAGNOSIS — E785 Hyperlipidemia, unspecified: Secondary | ICD-10-CM | POA: Insufficient documentation

## 2010-10-18 LAB — GLUCOSE, CAPILLARY: Glucose-Capillary: 117 mg/dL — ABNORMAL HIGH (ref 70–99)

## 2010-10-18 LAB — POCT I-STAT, CHEM 8
BUN: 12 mg/dL (ref 6–23)
Calcium, Ion: 1.16 mmol/L (ref 1.12–1.32)
Chloride: 100 mEq/L (ref 96–112)

## 2010-10-23 ENCOUNTER — Ambulatory Visit (HOSPITAL_COMMUNITY): Payer: Medicare PPO | Admitting: Specialist

## 2010-10-25 ENCOUNTER — Ambulatory Visit (HOSPITAL_COMMUNITY): Payer: Medicare PPO | Admitting: Specialist

## 2010-10-30 ENCOUNTER — Ambulatory Visit (HOSPITAL_COMMUNITY)
Admission: RE | Admit: 2010-10-30 | Discharge: 2010-10-30 | Disposition: A | Discharge status: Home / Self Care | Payer: Medicare PPO | Source: Ambulatory Visit | Attending: Family Medicine | Admitting: Family Medicine

## 2010-10-30 NOTE — Progress Notes (Signed)
Occupational Therapy Treatment  Patient Name: Sandra Cuevas MRN: [id number] Today's Date: 10/30/2010  Time In 11:10 Time Out 1145  Manual Therapy 11:01-1134   Visit 25/32  Reassessment date: 11/03/2010    HPI: Symptoms/Limitations Symptoms: I have had carpal tunnel surgery since the last time. Pain Assessment Currently in Pain?: Yes Pain Score:   5 Pain Location: Arm Pain Orientation: Anterior;Upper Pain Type: Chronic pain Pain Onset: More than a month ago Pain Frequency: Other (Comment) (hurts with movement and worse than it has been in a while)  Precautions/Restrictions    Mobility       Exercise/Treatments Cervical Exercises Shoulder Flexion: PROM;AROM;10 reps;Supine Therapy Ball (Shoulder Flexion): x20 Shoulder ABduction: PROM;AROM;10 reps;Supine Therapy Ball (Shoulder Abduction): x20 10 right/10 left Shoulder Horizontal ABduction: PROM;AROM;10 reps;Supine Shoulder Internal Rotation: PROM;AROM;10 reps;Supine Shoulder External Rotation: PROM;AROM;10 reps;Supine Shoulder Exercises Shoulder Flexion: PROM;AROM;10 reps;Supine Therapy Ball (Shoulder Flexion): x20 Shoulder Protraction: Supine;AROM;10 reps Shoulder Horizontal ABduction: PROM;AROM;10 reps;Supine Shoulder Horizontal ADduction: Supine;AROM;10 reps Shoulder ABduction: PROM;AROM;10 reps;Supine Therapy Ball (Shoulder Abduction): x20 10 right/10 left Shoulder External Rotation: PROM;AROM;10 reps;Supine Shoulder Internal Rotation: PROM;AROM;10 reps;Supine Neurological Re-education Exercises Shoulder Flexion: PROM;AROM;10 reps;Supine Therapy Ball (Shoulder Flexion): x20 Shoulder ABduction: PROM;AROM;10 reps;Supine Therapy Ball (Shoulder Abduction): x20 10 right/10 left Shoulder Protraction: Supine;AROM;10 reps Shoulder Horizontal ABduction: PROM;AROM;10 reps;Supine Shoulder External Rotation: PROM;AROM;10 reps;Supine Shoulder Internal Rotation: PROM;AROM;10 reps;Supine Manual Therapy Manual Therapy:  Myofascial release Myofascial Release: 11:10-11:35   Goals   End of Session Patient Active Problem List  Diagnoses  . CERVICAL CANCER  . DM  . HYPERLIPIDEMIA  . HYPERTENSION  . EXTERNAL HEMORRHOIDS WITHOUT MENTION COMP  . PNEUMONIA, ORGANISM UNSPECIFIED  . RECTAL BLEEDING  . COUGH  . Hyperlipidemia  . Hypertension  . Atypical chest pain  . Hypothyroidism  . Diabetes mellitus   End of Session Activity Tolerance: Patient tolerated treatment well OT Assessment and Plan Clinical Impression Statement: Some exercises held secondary to recent carpal tunnel surgery Prognosis: Good OT Frequency: Min 2X/week OT Duration: 4 weeks OT Plan: resume exercises as patient tolerates   Noralee Stain, Leshonda Galambos L 10/30/2010, 6:49 PM

## 2010-10-31 NOTE — Progress Notes (Signed)
Occupational Therapy Treatment  Patient Name: KAILAN CARMEN MRN: [id number] Today's Date: 10/31/2010   OT Time in: 11:04         Time out: 11:40     Manual Therapy:  Visit  25/32 Reassessment date: 11/03/3010         HPI: Symptoms/Limitations Symptoms: I have had carpal tunnel surgery since the last time. Pain Assessment Currently in Pain?: Yes Pain Score:   5 Pain Location: Arm Pain Orientation: Anterior;Upper Pain Type: Chronic pain Pain Onset: More than a month ago Pain Frequency: Other (Comment) (hurts with movement and worse than it has been in a while)  Exercise/Treatments   Manual Therapy Manual Therapy: Myofascial release Myofascial Release: MFR and manuel stretchin to right UE to decrease pain and increse ROM   Goals Long Term Goals Long Term Goal 1: Educate patient on HEP. Long Term Goal 1 Progress: Progressing toward goal Long Term Goal 2: Decrease pain to 1/10 when reaching overhead. Long Term Goal 2 Progress: Progressing toward goal Long Term Goal 3: Increase  AROM and PROM to [facility] for increasing independence with dressing. Long Term Goal 3 Progress: Progressing toward goal Long Term Goal 4: Increase right shoulder strength to 4+/5 for incresed independebnce. Long Term Goal 4 Progress: Progressing toward goal Long Term Goal 5: Return to PLOF. Additional Long Term Goals?: Yes End of Session Patient Active Problem List  Diagnoses  . CERVICAL CANCER  . DM  . HYPERLIPIDEMIA  . HYPERTENSION  . EXTERNAL HEMORRHOIDS WITHOUT MENTION COMP  . PNEUMONIA, ORGANISM UNSPECIFIED  . RECTAL BLEEDING  . COUGH  . Hyperlipidemia  . Hypertension  . Atypical chest pain  . Hypothyroidism  . Diabetes mellitus       Vaughan Browner 10/31/2010, 4:13 PM

## 2010-11-01 ENCOUNTER — Ambulatory Visit (HOSPITAL_COMMUNITY)
Admission: RE | Admit: 2010-11-01 | Discharge: 2010-11-01 | Disposition: A | Discharge status: Home / Self Care | Payer: Medicare PPO | Source: Ambulatory Visit | Attending: Family Medicine | Admitting: Family Medicine

## 2010-11-01 NOTE — Progress Notes (Addendum)
Occupational Therapy Treatment  Patient Name: Sandra Cuevas MRN: [id number] Today's Date: 11/01/2010    Time In:  1137        Time Out : 1213 Manual therapy [id number] Therapeutic Exercise [id number]  Visit 26/32 Reassess on 11/03/2010    HPI: Symptoms/Limitations Symptoms: patient states it has hurt since she fell on it a week ago Pain Assessment Currently in Pain?: Yes Pain Score:   5 Pain Location: Shoulder Pain Orientation: Anterior;Right Pain Type: Surgical pain;Chronic pain Pain Onset: More than a month ago Pain Frequency: Intermittent       11/01/10 1200  Cervical Exercises  Shoulder Flexion PROM;AROM;10 reps;Supine;Seated;Other reps (comment) (x12)  Therapy Ball (Shoulder Flexion) x20  Shoulder ABduction PROM;AROM;10 reps;Supine  Therapy Ball (Shoulder Abduction) x20 10 right/10 left  Shoulder Horizontal ABduction AROM  Shoulder Internal Rotation PROM;AROM;10 reps;Supine  Shoulder External Rotation PROM;AROM;10 reps;Supine  Shoulder Exercises  Shoulder Protraction Supine;AROM;10 reps  Shoulder Horizontal ADduction PROM  Additional ROM/Strengthening Exercises  Proximal Shoulder Strengthening - Seated x15          Goals Long Term Goals Long Term Goal 1 Progress: Progressing toward goal Long Term Goal 2 Progress: Progressing toward goal Long Term Goal 3 Progress: Progressing toward goal Long Term Goal 4 Progress: Progressing toward goal Long Term Goal 5 Progress: Progressing toward goal End of Session Patient Active Problem List  Diagnoses  . CERVICAL CANCER  . DM  . HYPERLIPIDEMIA  . HYPERTENSION  . EXTERNAL HEMORRHOIDS WITHOUT MENTION COMP  . PNEUMONIA, ORGANISM UNSPECIFIED  . RECTAL BLEEDING  . COUGH  . Hyperlipidemia  . Hypertension  . Atypical chest pain  . Hypothyroidism  . Diabetes mellitus   End of Session Activity Tolerance: Patient tolerated treatment well OT Assessment and Plan Clinical Impression Statement: continue to hold on  some exercises secondary to pain and recent surgery.  Encouraged patient to contact MD if pain continues. Prognosis: Good OT Plan: continue to monitor pain and function from fall.   Noralee Stain, [name] 11/01/2010, 6:21 PM

## 2010-11-06 ENCOUNTER — Ambulatory Visit (HOSPITAL_COMMUNITY)
Admission: RE | Admit: 2010-11-06 | Discharge: 2010-11-06 | Disposition: A | Discharge status: Home / Self Care | Payer: Medicare PPO | Source: Ambulatory Visit | Attending: Family Medicine | Admitting: Family Medicine

## 2010-11-06 DIAGNOSIS — G5600 Carpal tunnel syndrome, unspecified upper limb: Secondary | ICD-10-CM | POA: Insufficient documentation

## 2010-11-06 NOTE — Progress Notes (Signed)
Occupational Therapy Reassessment/Treatment  Patient Name: Sandra Cuevas Date: 11/06/2010 Time In 1020 Time Out 1053  Manual Therapy 1020 Time 1037  Reassessment 1040-1053 Visit 27/40  Reassess on 12/04/2010     HPI: Symptoms/Limitations Symptoms: S:  "I fell about 3 weeks ago and my shoulder is sore.  I have had it xrayed and it is ok.  I have an order from the MD for my wrist for therapy now." Limitations: Increased pain.   Pain Assessment Currently in Pain?: Yes Pain Score:   7 Pain Location: Shoulder Pain Orientation: Right Past Medical History:  Past Medical History  Diagnosis Date  . Hyperlipidemia   . Hypertension   . Atypical chest pain   . Hypothyroidism   . Diabetes mellitus   . History of cardiovascular stress test 06/01/2010    EF 71% - no evidence of ischemia, normal left ventricular systolic function  . Shortness of breath     on exertion  . History of hysterectomy 1965   Past Surgical History:  Past Surgical History  Procedure Date  . Cardiac catheterization 2001    EF=65%  . Hemorrhoid surgery     Precautions/Restrictions     Prior Functioning     Assessment RUE AROM (degrees) Right Shoulder Flexion  0-170: 95 Degrees Right Shoulder ABduction 0-140: 90 Degrees Right Shoulder Internal Rotation  0-70: 90 Degrees Right Shoulder External Rotation  0-90: 40 Degrees Right Forearm Pronation  0-80-90: 86 Degrees Right Forearm Supination  0-80-90: 80 Degrees Right Wrist Extension 0-70: 38 Degrees Right Wrist Flexion 0-80: 48 Degrees RUE Strength Right Shoulder Flexion:  (4-/5) Right Shoulder ABduction:  (4-/5) Right Shoulder Internal Rotation: 4/5 Right Shoulder External Rotation: 4/5 Right Forearm Pronation:  (not assessed) Right Forearm Supination:  (not assessed) Right Wrist Flexion:  (not assessed) Right Wrist Extension:  (not assessed) Gross Grasp:  (right:  10/4  left:  42/12 (grip and lateral pinch))  Mobility        Exercise/Treatments  Cervical Exercises Shoulder Flexion: Supine;PROM;10 reps Shoulder ABduction: Supine;PROM;10 reps Shoulder Horizontal ABduction: Supine;PROM;10 reps Shoulder Internal Rotation: Supine;PROM;10 reps Shoulder External Rotation: Supine;PROM;10 reps Shoulder Exercises Shoulder Flexion: Supine;PROM;10 reps Shoulder Protraction: Supine;PROM;10 reps Shoulder Horizontal ABduction: Supine;PROM;10 reps Shoulder ABduction: Supine;PROM;10 reps Shoulder External Rotation: Supine;PROM;10 reps Shoulder Internal Rotation: Supine;PROM;10 reps Neurological Re-education Exercises Shoulder Flexion: Supine;PROM;10 reps Shoulder ABduction: Supine;PROM;10 reps Shoulder Protraction: Supine;PROM;10 reps Shoulder Horizontal ABduction: Supine;PROM;10 reps Shoulder External Rotation: Supine;PROM;10 reps Shoulder Internal Rotation: Supine;PROM;10 reps Manual Therapy Manual Therapy: Myofascial release Myofascial Release: MFR and manual stretching to right shoulder to decrase pain and fascial restrictions and increase mobility in pain free range.     Goals Long Term Goals Long Term Goal 1 Progress: Progressing toward goal Long Term Goal 2 Progress: Progressing toward goal Long Term Goal 3 Progress: Progressing toward goal Long Term Goal 4 Progress: Progressing toward goal Long Term Goal 5 Progress: Progressing toward goal Additional Long Term Goals?: Yes Long Term Goal 6: Patient will increase right forearm and wrist AROM to [facility] for increased independence with ADL completion.   Long Term Goal 7: Patient will increase right forearm and wrist strength to [facility] for increased independence lifting groceries. Long Term Goal 8: Patient will increase right grip and lateral pinch strength to equal her left hand for increased independence opening containers. Long Term Goal 9: Patient will decrease fascial and scar restrictions in her right wrist to minimal.  End of Session Patient Active Problem  List  Diagnoses  .  CERVICAL CANCER  . DM  . HYPERLIPIDEMIA  . HYPERTENSION  . EXTERNAL HEMORRHOIDS WITHOUT MENTION COMP  . PNEUMONIA, ORGANISM UNSPECIFIED  . RECTAL BLEEDING  . COUGH  . Hyperlipidemia  . Hypertension  . Atypical chest pain  . Hypothyroidism  . Diabetes mellitus  . Carpal tunnel syndrome   End of Session Activity Tolerance: Patient tolerated treatment well General Behavior During Session: [facility] for tasks performed Cognition: [facility] for tasks performed OT Assessment and Plan Clinical Impression Statement: A:  Please refer to progress note in letters section of this chart for details.  Assessed right wrist this date.  Held therapeutic exercises for her shoulder due to increased pain level this date. Prognosis: Good OT Frequency: Min 2X/week OT Duration: 4 weeks OT Plan: P:  MFR and manual stretching to right shoulder, forearm, wrist.  Resume shoulder ther ex.  Add foreram and wrist AROM, PROM, wrist stretch into flexion and extension, sponges, yellow theraputty, grooved pegboard.    Time Calculation Start Time: 1020 Stop Time: 1053 Time Calculation (min): 33 min  Sandra Cuevas 11/06/2010, 11:51 AM

## 2010-11-08 ENCOUNTER — Ambulatory Visit (HOSPITAL_COMMUNITY)
Admission: RE | Admit: 2010-11-08 | Discharge: 2010-11-08 | Disposition: A | Discharge status: Home / Self Care | Payer: Medicare PPO | Source: Ambulatory Visit | Attending: Family Medicine | Admitting: Family Medicine

## 2010-11-08 DIAGNOSIS — IMO0001 Reserved for inherently not codable concepts without codable children: Secondary | ICD-10-CM | POA: Insufficient documentation

## 2010-11-08 DIAGNOSIS — M6281 Muscle weakness (generalized): Secondary | ICD-10-CM | POA: Insufficient documentation

## 2010-11-08 DIAGNOSIS — M25619 Stiffness of unspecified shoulder, not elsewhere classified: Secondary | ICD-10-CM | POA: Insufficient documentation

## 2010-11-08 DIAGNOSIS — M25519 Pain in unspecified shoulder: Secondary | ICD-10-CM | POA: Insufficient documentation

## 2010-11-08 NOTE — Progress Notes (Signed)
Occupational Therapy Treatment  Patient Name: Sandra Cuevas MRN: [id number] Today's Date: 11/08/2010 Time In 1021 Time Out 1115  Manual Therapy 1021Time 1050 Therapeutic Exercises [id number]  Visit 28/40  Reassess on 12/04/2010   HPI: S: I feel better today, all around, but my shoulder is still really sore. Pain Assessment Currently in Pain?: Yes Pain Score:   4 Pain Location: Shoulder Pain Orientation: Right  Precautions/Restrictions    Mobility       Exercise/Treatments Therapeutic Exercises Shoulder Exercises Shoulder Flexion: Supine;PROM;10 reps Therapy Ball (Shoulder Flexion): 20 flexion, 5 right and left Pulleys (Shoulder Flexion): 2 min flexion and abduction Shoulder Protraction: Supine;PROM;10 reps Shoulder Horizontal ABduction: Supine;PROM;10 reps Shoulder ABduction: Supine;PROM;10 reps Shoulder External Rotation: Supine;PROM;10 reps Shoulder Internal Rotation: Supine;PROM;10 reps Sponges: 25, 22 Theraputty - Flatten: pink Theraputty - Roll: pink Theraputty - Grip: pink Weighted Stretch Over Towel Roll Wrist Flexion - Weighted Stretch:  (1 pound x 1 min) Wrist Extension - Weighted Stretch: Other (comment) (1 pound x 1 min) Wrist Exercises Forearm Supination: Supine;PROM;Seated;AROM;10 reps Forearm Pronation: Supine;PROM;Seated;AROM;10 reps Wrist Flexion: Supine;PROM;Seated;AROM;10 reps Wrist Extension: Supine;PROM;Seated;AROM;10 reps Manual Therapy Manual Therapy: Other (comment) Myofascial Release: MFR and manual stretching to right anterior shoulder, scapular region, and upper arm.  MFR and manual stretching to flexor and extensor forearm, wrist, and hand.   Other Manual Therapy: scar release to right anterior shoulder.  add scar release to wrist once steristrips are removed.   Goals   End of Session Patient Active Problem List  Diagnoses  . CERVICAL CANCER  . DM  . HYPERLIPIDEMIA  . HYPERTENSION  . EXTERNAL HEMORRHOIDS WITHOUT MENTION COMP    . PNEUMONIA, ORGANISM UNSPECIFIED  . RECTAL BLEEDING  . COUGH  . Hyperlipidemia  . Hypertension  . Atypical chest pain  . Hypothyroidism  . Diabetes mellitus  . Carpal tunnel syndrome   End of Session Activity Tolerance: Patient tolerated treatment well General Behavior During Session: [facility] for tasks performed Cognition: [facility] for tasks performed OT Assessment and Plan Clinical Impression Statement: A:  began manual therapy and therapeutic exercises for her wrist and hand this date.  Omitted all strengthening exercises for her shoulder due to continued soreness s/p fall. OT Plan: P:  Resume shoulder strengthening as tolerated.     Sandra Cuevas 11/08/2010, 12:03 PM

## 2010-11-13 ENCOUNTER — Ambulatory Visit (HOSPITAL_COMMUNITY)
Admission: RE | Admit: 2010-11-13 | Discharge: 2010-11-13 | Disposition: A | Discharge status: Home / Self Care | Payer: Medicare PPO | Source: Ambulatory Visit | Attending: Family Medicine | Admitting: Family Medicine

## 2010-11-13 NOTE — Progress Notes (Signed)
Occupational Therapy Treatment  Patient Name: Sandra Cuevas MRN: [id number] Today's Date: 11/13/2010   Time In 1020 Time Out 1120  Manual Therapy 1020Time 1050  Therapeutic Exercises 1050-1120  Visit 29/40  Reassess on 12/04/2010       HPI: Symptoms/Limitations Symptoms: It feels better but still so tight and sore. Pain Assessment Currently in Pain?: Yes Pain Score:   4 Pain Location: Shoulder Pain Orientation: Right Pain Type: Surgical pain Pain Onset: More than a month ago Pain Frequency: Intermittent        Exercise/Treatments Cervical Exercises Shoulder Flexion: PROM;10 reps;AROM;15 reps;Supine Therapy Ball (Shoulder Flexion): 20 flexion, 5 right and left Pulleys (Shoulder Flexion): 2 min flexion and abduction Shoulder ABduction: PROM;10 reps;AROM;15 reps;Supine Therapy Ball (Shoulder Abduction): x20 10 right/10 left Shoulder Horizontal ABduction: PROM;10 reps;AROM;15 reps;Supine Shoulder Internal Rotation: PROM;10 reps;AROM;15 reps;Supine Shoulder External Rotation: PROM;10 reps;AROM;15 reps;Supine Shoulder Exercises Shoulder Flexion: PROM;10 reps;AROM;15 reps;Supine Therapy Ball (Shoulder Flexion): 20 flexion, 5 right and left Pulleys (Shoulder Flexion): 2 min flexion and abduction Shoulder Protraction: PROM;10 reps;AROM;15 reps;Supine Shoulder Horizontal ABduction: PROM;10 reps;AROM;15 reps;Supine Shoulder ABduction: PROM;10 reps;AROM;15 reps;Supine Therapy Ball (Shoulder Abduction): x20 10 right/10 left Shoulder External Rotation: PROM;10 reps;AROM;15 reps;Supine Shoulder Internal Rotation: PROM;10 reps;AROM;15 reps;Supine Forearm Supination: PROM;10 reps;Supine Forearm Pronation: PROM;10 reps;Supine;AROM;15 reps;Seated Wrist Flexion: PROM;10 reps;Supine;AROM;15 reps;Seated Wrist Extension: PROM;10 reps;Supine;AROM;15 reps;Seated Additional ROM/Strengthening Exercises Proximal Shoulder Strengthening - Seated: x15 Elbow Exercises Forearm  Supination: PROM;10 reps;Supine Forearm Pronation: PROM;10 reps;Supine;AROM;15 reps;Seated Wrist Flexion: PROM;10 reps;Supine;AROM;15 reps;Seated Wrist Extension: PROM;10 reps;Supine;AROM;15 reps;Seated Additional Elbow Exercises Sponges:  (24, 33, 37) Theraputty - Flatten: pink Theraputty - Roll: pink Theraputty - Grip: pink Weighted Stretch Over Towel Roll Wrist Flexion - Weighted Stretch:  (1# x 1') Wrist Extension - Weighted Stretch:  (1# x 1') Wrist Exercises Forearm Supination: PROM;10 reps;Supine Forearm Pronation: PROM;10 reps;Supine;AROM;15 reps;Seated Wrist Flexion: PROM;10 reps;Supine;AROM;15 reps;Seated Wrist Extension: PROM;10 reps;Supine;AROM;15 reps;Seated Additional Wrist Exercises Sponges:  (24, 33, 37) Theraputty - Flatten: pink Theraputty - Roll: pink Theraputty - Grip: pink Theraputty - Locate Pegs: pink 10 Hand Exercises Theraputty - Flatten: pink Theraputty - Roll: pink Theraputty - Grip: pink Theraputty - Locate Pegs: pink 10 Sponges:  (24, 33, 37) Neurological Re-education Exercises Shoulder Flexion: PROM;10 reps;AROM;15 reps;Supine Therapy Ball (Shoulder Flexion): 20 flexion, 5 right and left Pulleys (Shoulder Flexion): 2 min flexion and abduction Shoulder ABduction: PROM;10 reps;AROM;15 reps;Supine Therapy Ball (Shoulder Abduction): x20 10 right/10 left Shoulder Protraction: PROM;10 reps;AROM;15 reps;Supine Shoulder Horizontal ABduction: PROM;10 reps;AROM;15 reps;Supine Shoulder External Rotation: PROM;10 reps;AROM;15 reps;Supine Shoulder Internal Rotation: PROM;10 reps;AROM;15 reps;Supine Forearm Supination: PROM;10 reps;Supine Forearm Pronation: PROM;10 reps;Supine;AROM;15 reps;Seated Wrist Flexion: PROM;10 reps;Supine;AROM;15 reps;Seated Wrist Extension: PROM;10 reps;Supine;AROM;15 reps;Seated Sponges:  (24, 33, 37) Grasp and Release Theraputty - Flatten: pink Theraputty - Roll: pink Theraputty - Grip: pink Theraputty - Locate Pegs:  pink 10 Manual Therapy Manual Therapy: Myofascial release Myofascial Release: 1020-1050 Other Manual Therapy: scar release to right anterior shoulder and wrist.   Goals Long Term Goals Long Term Goal 1: Educate patient on HEP. Long Term Goal 1 Progress: Progressing toward goal Long Term Goal 2: Decrease pain to 1/10 when reaching overhead. Long Term Goal 2 Progress: Progressing toward goal Long Term Goal 3: Increase  AROM and PROM to [facility] for increasing independence with dressing. Long Term Goal 3 Progress: Progressing toward goal Long Term Goal 4: Increase right shoulder strength to 4+/5 for incresed independebnce. Long Term Goal 4 Progress: Progressing toward goal Long Term Goal 5: Return to  PLOF. Long Term Goal 5 Progress: Progressing toward goal Additional Long Term Goals?: Yes Long Term Goal 6 Progress: Progressing toward goal Long Term Goal 7 Progress: Progressing toward goal Long Term Goal 8 Progress: Progressing toward goal Long Term Goal 9 Progress: Progressing toward goal End of Session Patient Active Problem List  Diagnoses  . CERVICAL CANCER  . DM  . HYPERLIPIDEMIA  . HYPERTENSION  . EXTERNAL HEMORRHOIDS WITHOUT MENTION COMP  . PNEUMONIA, ORGANISM UNSPECIFIED  . RECTAL BLEEDING  . COUGH  . Hyperlipidemia  . Hypertension  . Atypical chest pain  . Hypothyroidism  . Diabetes mellitus  . Carpal tunnel syndrome   End of Session Activity Tolerance: Patient tolerated treatment well OT Assessment and Plan Clinical Impression Statement: began scar massage to wrist and hand.  Resumed shoulder strengthening which patient tolerated well. Prognosis: Good OT Plan: add low wall wash x 2'   Eara Burruel L 11/13/2010, 11:56 AM

## 2010-11-14 ENCOUNTER — Encounter (INDEPENDENT_AMBULATORY_CARE_PROVIDER_SITE_OTHER): Payer: Medicare PPO | Admitting: Ophthalmology

## 2010-11-14 DIAGNOSIS — H2510 Age-related nuclear cataract, unspecified eye: Secondary | ICD-10-CM

## 2010-11-14 DIAGNOSIS — H43819 Vitreous degeneration, unspecified eye: Secondary | ICD-10-CM

## 2010-11-14 DIAGNOSIS — H3530 Unspecified macular degeneration: Secondary | ICD-10-CM

## 2010-11-15 ENCOUNTER — Ambulatory Visit (HOSPITAL_COMMUNITY)
Admission: RE | Admit: 2010-11-15 | Discharge: 2010-11-15 | Disposition: A | Discharge status: Home / Self Care | Payer: Medicare PPO | Source: Ambulatory Visit | Attending: Specialist | Admitting: Specialist

## 2010-11-15 NOTE — Progress Notes (Signed)
Occupational Therapy Treatment  Patient Name: Sandra Cuevas MRN: [id number] Today's Date: 11/15/2010 Time In 1131 Time Out 1214 Manual Therapy 1131 Time 1152  Therapeutic Exercises [id number] Visit 30/40  Reassess on 12/04/2010   HPI: Symptoms/Limitations Symptoms: S:  The massage has really been helping my shoulder.   Pain Assessment Currently in Pain?: No/denies    Exercise/Treatments Shoulder Exercises Shoulder Flexion: Supine;PROM;Strengthening;10 reps (1 pound) Therapy Ball (Shoulder Flexion): dc Pulleys (Shoulder Flexion): dc Shoulder Protraction: Supine;PROM;Strengthening;10 reps (1 pound) Shoulder Horizontal ABduction: Supine;PROM;Strengthening;10 reps (1 pound) Shoulder ABduction: Supine;PROM;Strengthening;10 reps (1 pound) Therapy Ball (Shoulder Abduction): dc Shoulder External Rotation: Supine;PROM;Strengthening;10 reps (1 pound) Shoulder Internal Rotation: Supine;PROM;Strengthening;10 reps (1 pound)  Wrist Exercises Forearm Supination: Supine;PROM;10 reps;Seated;Strengthening (2 pounds x 10 reps) Forearm Pronation: Supine;PROM;10 reps;Seated;Strengthening (2 pounds x 10 reps) Wrist Flexion: Supine;PROM;10 reps;Seated;Strengthening (2 pounds x 10 reps) Wrist Extension: Supine;PROM;10 reps;Seated;Strengthening (2 pounds x 10 reps) Additional Wrist Exercises Sponges: 27, 36 Theraputty - Flatten: pink Theraputty - Roll: pink Theraputty - Grip: pink Theraputty - Locate Pegs: pink 10  Manual Therapy Manual Therapy: Other (comment) Myofascial Release: MFR and manual stretching to right anterior shoulder, scapular region, pectoral region, and upper arm.  Also to flexor and extensor forearm, wrist, and hand. Other Manual Therapy: scar release to shoulder and wrist.   Goals Long Term Goals Long Term Goal 1 Progress: Progressing toward goal Long Term Goal 2 Progress: Progressing toward goal Long Term Goal 3 Progress: Progressing toward goal Long Term Goal 4 Progress:  Progressing toward goal Long Term Goal 5 Progress: Progressing toward goal End of Session Patient Active Problem List  Diagnoses  . CERVICAL CANCER  . DM  . HYPERLIPIDEMIA  . HYPERTENSION  . EXTERNAL HEMORRHOIDS WITHOUT MENTION COMP  . PNEUMONIA, ORGANISM UNSPECIFIED  . RECTAL BLEEDING  . COUGH  . Hyperlipidemia  . Hypertension  . Atypical chest pain  . Hypothyroidism  . Diabetes mellitus  . Carpal tunnel syndrome   End of Session Activity Tolerance: Patient tolerated treatment well General Behavior During Session: [facility] for tasks performed Cognition: [facility] for tasks performed OT Assessment and Plan Clinical Impression Statement: A:  Less pain this date.  Added 1 pound to supine shoulder exercises and 2 pounds to wrist exercises. OT Plan: P:  Add wall wash, UBE, seated shoulder strengthening, increase to 2 min with wrist stretch, add cybex, add hand grippier with large beads, and increase green tputty.   Shirlean Mylar, OTR/L  11/15/2010, 1:06 PM

## 2010-11-15 NOTE — Op Note (Signed)
  NAMEMILAINA, Sandra Cuevas NO.:  [id number]  MEDICAL RECORD NO.:  [id number]  LOCATION:                                 FACILITY:  PHYSICIAN:  Loreta Ave, M.D. DATE OF BIRTH:  [birth year]  DATE OF PROCEDURE:  10/18/2010 DATE OF DISCHARGE:                              OPERATIVE REPORT   PREOPERATIVE DIAGNOSIS:  Right carpal tunnel syndrome.  POSTOPERATIVE DIAGNOSIS:  Right carpal tunnel syndrome.  PROCEDURE:  Right carpal tunnel release.  SURGEON:  Loreta Ave, MD  ASSISTANT:  Zonia Kief, PA  ANESTHESIA:  General.  ESTIMATED BLOOD LOSS:  Minimal.  SPECIMEN:  None.  COUNTS:  None.  COMPLICATIONS:  None.  DRESSING:  Soft compressive bulky hand dressing and splint.  TOURNIQUET TIME:  30 minutes.  PROCEDURE IN DETAIL:  The patient was brought to the operating room and placed on the operating table in supine position.  After adequate anesthesia had been obtained, a tourniquet was applied.  Prepped and draped in the usual sterile fashion.  Exsanguinated with elevation of Esmarch and tourniquet inflated to 250 mmHg.  Small longitudinal incision over the carpal tunnel volarly.  Skin and subcutaneous tissue divided.  Retinaculum over the carpal tunnel incised under direct visualization from the forearm fascia proximally and the palmar arch distally.  Nice decompression confirmed.  Digital branch and motor branch identified, protected, and decompressed. Moderate constriction on the nerve improved after release and aponeurotomy.  Wound irrigated.  Closed with nylon.  Margins were injected with Marcaine.  Sterile compressive dressing applied. Anesthesia reversed.  Brought to recovery room.  Tolerated the surgery well.  No complications.     Loreta Ave, M.D.     DFM/MEDQ  D:  10/18/2010  T:  10/18/2010  Job:  [id number]  Electronically Signed by Mckinley Jewel M.D. on 11/15/2010 03:40:29 PM

## 2010-11-20 ENCOUNTER — Ambulatory Visit (HOSPITAL_COMMUNITY)
Admission: RE | Admit: 2010-11-20 | Discharge: 2010-11-20 | Disposition: A | Discharge status: Home / Self Care | Payer: Medicare PPO | Source: Ambulatory Visit | Attending: Specialist | Admitting: Specialist

## 2010-11-20 NOTE — Progress Notes (Signed)
Occupational Therapy Treatment  Patient Name: Sandra Cuevas MRN: [id number] Today's Date: 11/20/2010   Time In 940 Time Out 1042 Manual Therapy 940 Time 1010  Therapeutic Exercises 5784-6962  Visit 31/40  Reassess on 12/04/2010   HPI: Symptoms/Limitations Symptoms: it is doing a little better, it hurts when I move it.  My wrist is good, I can wring out a washcloth. Pain Assessment Currently in Pain?: Yes Pain Score:   5 Pain Location: Shoulder Pain Onset: More than a month ago Pain Frequency: Intermittent         Exercise/Treatments Shoulder Exercises Shoulder Flexion: Supine;PROM;Strengthening;15 reps Shoulder Protraction: Supine;PROM;Strengthening;15 reps Shoulder Horizontal Abduction: Supine;PROM;Strengthening;15 reps Shoulder Abduction: Supine;PROM;Strengthening;15 reps Shoulder External Rotation: Supine;PROM;Strengthening;15 reps Shoulder Internal Rotation: Supine;PROM;Strengthening;15 reps Forearm Supination: Supine;PROM;Seated;Strengthening;15 reps Forearm Pronation: Supine;PROM;Seated;Strengthening;15 reps Wrist Flexion: Supine;PROM;Seated;Strengthening;15 reps Wrist Extension: Supine;PROM;Seated;Strengthening;15 reps Additional ROM/Strengthening Exercises UBE (Upper Arm Bike): 3' forward and 3' reverse 1.0 Wall Wash: 3 min Proximal Shoulder Strengthening - Seated: x15 Elbow Exercises Forearm Supination: Supine;PROM;Seated;Strengthening;15 reps Forearm Pronation: Supine;PROM;Seated;Strengthening;15 reps Wrist Flexion: Supine;PROM;Seated;Strengthening;15 reps Wrist Extension: Supine;PROM;Seated;Strengthening;15 reps Additional Elbow Exercises UBE (Upper Arm Bike): 3' forward and 3' reverse 1.0 Theraputty - Flatten: pink Theraputty - Roll: pink Theraputty - Grip: pink Weighted Stretch Over Towel Roll Wrist Flexion - Weighted Stretch:  (2' with 1#) Wrist Extension - Weighted Stretch:  (2' with 1') Wrist Exercises Forearm Supination:  Supine;PROM;Seated;Strengthening;15 reps Forearm Pronation: Supine;PROM;Seated;Strengthening;15 reps Wrist Flexion: Supine;PROM;Seated;Strengthening;15 reps Wrist Extension: Supine;PROM;Seated;Strengthening;15 reps Additional Wrist Exercises Theraputty - Flatten: pink Theraputty - Roll: pink Theraputty - Grip: pink Theraputty - Locate Pegs: pink 10 Hand Exercises Theraputty - Flatten: pink Theraputty - Roll: pink Theraputty - Grip: pink Theraputty - Locate Pegs: pink 10  Manual Therapy Manual Therapy: Myofascial release Myofascial Release: MFR and manuel stretching to right arm, shoulder and scapular region to decrease restrictions and increase pain free ROM   Goals Long Term Goals Long Term Goal 1: Educate patient on HEP. Long Term Goal 1 Progress: Progressing toward goal Long Term Goal 2: Decrease pain to 1/10 when reaching overhead. Long Term Goal 2 Progress: Progressing toward goal Long Term Goal 3: Increase  AROM and PROM to Sandra Cuevas for increasing independence with dressing. Long Term Goal 3 Progress: Progressing toward goal Long Term Goal 4: Increase right shoulder strength to 4+/5 for incresed independebnce. Long Term Goal 4 Progress: Progressing toward goal Long Term Goal 5: Return to PLOF. Long Term Goal 5 Progress: Progressing toward goal Additional Long Term Goals?: Yes Long Term Goal 6 Progress: Progressing toward goal Long Term Goal 7 Progress: Progressing toward goal Long Term Goal 8 Progress: Progressing toward goal Long Term Goal 9 Progress: Progressing toward goal End of Session Patient Active Problem List  Diagnoses  . CERVICAL CANCER  . DM  . HYPERLIPIDEMIA  . HYPERTENSION  . EXTERNAL HEMORRHOIDS WITHOUT MENTION COMP  . PNEUMONIA, ORGANISM UNSPECIFIED  . RECTAL BLEEDING  . COUGH  . Hyperlipidemia  . Hypertension  . Atypical chest pain  . Hypothyroidism  . Diabetes mellitus  . Carpal tunnel syndrome   End of Session Activity Tolerance: Patient  tolerated treatment well OT Assessment and Plan Clinical Impression Statement: began wall wash and UBE Prognosis: Good OT Plan: increase supine to 2# and seated to 1#  Sandra Cuevas L. Sandra Cuevas, COTA/L  11/20/2010, 12:16 PM

## 2010-11-22 ENCOUNTER — Ambulatory Visit (HOSPITAL_COMMUNITY)
Admission: RE | Admit: 2010-11-22 | Discharge: 2010-11-22 | Disposition: A | Discharge status: Home / Self Care | Payer: Medicare PPO | Source: Ambulatory Visit | Attending: Specialist | Admitting: Specialist

## 2010-11-22 NOTE — Progress Notes (Signed)
Occupational Therapy Treatment  Patient Name: Sandra Cuevas MRN: [id number] Today's Date:  11/22/10 Time In 1030 Time Out 1123  Manual Therapy 1030 Time 1100  Therapeutic Exercises 1101-1123 Visit 32/40  Reassess on 12/04/2010   S: My wrist was sore yesterday, so I put some cream on it, and it feels better. Pain Assessment Currently in Pain?: Yes Pain Score:   3 Pain Location: Shoulder Pain Orientation: Right  Exercise/Treatments Shoulder Exercises Shoulder Flexion: Strengthening;Supine;Seated;15 reps (2 pounds) Shoulder Protraction: Strengthening;Supine;Seated;15 reps (2 pounds) Shoulder Horizontal ABduction: Strengthening;Supine;Seated;15 reps (2 pounds) Shoulder ABduction: Strengthening;Supine;Seated;15 reps (2 pounds) Shoulder External Rotation: Strengthening;Supine;Seated;15 reps (2 pounds) Shoulder Internal Rotation: Strengthening;Supine;Seated;15 reps (15 reps) Forearm Supination: Seated;Strengthening;15 reps (2 pounds) Forearm Pronation: Strengthening;Seated;15 reps (2 pounds) Wrist Flexion: Strengthening;Seated;15 reps (2 pounds) Wrist Extension: Strengthening;Seated;15 reps (2 pounds) UBE (Upper Arm Bike): 3' and 3' 1.5 Cybex Press: begin next visit Cybex Row: begin next visit Wall Wash: 3 minutes with 1 pound Proximal Shoulder Strengthening - Seated: omitted, add 1 pound at next visit Theraputty:  (increase to green at next visit) Theraputty - Flatten: pink Theraputty - Roll: pink Theraputty - Grip: pink Hand Gripper with Large Beads: begin next visit Weighted Stretch Over Towel Roll Wrist Flexion - Weighted Stretch: Other (comment) (resume with 3 pound weight next visit) Wrist Extension - Weighted Stretch: Other (comment) (resume next visit with 3 pound weight) Wrist Extension: Strengthening;Seated;15 reps (2 pounds) Theraputty:  (increase to green at next visit) Theraputty - Flatten: pink Theraputty - Roll: pink Theraputty - Grip: pink Theraputty - Locate  Pegs: pink 10 Hand Gripper with Large Beads: begin next visit Manual Therapy Manual Therapy: Other (comment) Myofascial Release: MFR and manual stretching to right upper arm, scapular, flexor and extensor forearm, and hand to decrease fascial restrictions and increase mobility.  Completed supine PROM of shoulder flexion, abduction, protraction, horizontal abduction, external rotation, internal rotation, supination, pronation, wrist flexion and extension. Other Manual Therapy: scar release to right shoulder and wrist.   Goals Long Term Goals Long Term Goal 1: Educate patient on HEP. Long Term Goal 2: Decrease pain to 1/10 when reaching overhead. Long Term Goal 3: Increase  AROM and PROM to [facility] for increasing independence with dressing. Long Term Goal 4: Increase right shoulder strength to 4+/5 for incresed independebnce. Long Term Goal 5: Return to PLOF. Additional Long Term Goals?: Yes End of Session Patient Active Problem List  Diagnoses  . CERVICAL CANCER  . DM  . HYPERLIPIDEMIA  . HYPERTENSION  . EXTERNAL HEMORRHOIDS WITHOUT MENTION COMP  . PNEUMONIA, ORGANISM UNSPECIFIED  . RECTAL BLEEDING  . COUGH  . Hyperlipidemia  . Hypertension  . Atypical chest pain  . Hypothyroidism  . Diabetes mellitus  . Carpal tunnel syndrome   End of Session Activity Tolerance: Patient tolerated treatment well General Behavior During Session: [facility] for tasks performed Cognition: [facility] for tasks performed OT Assessment and Plan Clinical Impression Statement: A:  Added 1 pound to wall wash, Increased to 2 pound with strengthening exercises.   OT Plan: P:  Add hand gripper and weighted wrist stretch, Increase to green tputty, Add cybex press and row.   Shirlean Mylar, OTR/L  11/22/2010, 3:13 PM

## 2010-11-27 ENCOUNTER — Ambulatory Visit (HOSPITAL_COMMUNITY)
Admission: RE | Admit: 2010-11-27 | Discharge: 2010-11-27 | Disposition: A | Discharge status: Home / Self Care | Payer: Medicare PPO | Source: Ambulatory Visit | Attending: Specialist | Admitting: Specialist

## 2010-11-27 NOTE — Progress Notes (Signed)
Occupational Therapy Treatment  Patient Name: Sandra Cuevas MRN: [id number] Today's Date: 11/27/2010  Time In 1020 Time Out 1115  Manual Therapy 1020 Time 1050  Therapeutic Exercises 1050-1115  Visit 33/40  Reassess on 12/04/2010     HPI: Symptoms/Limitations Symptoms: my wrist is doing pretty good and my shoulder is doing better, it is just sore. Pain Assessment Currently in Pain?: No/denies         Exercise/Treatments Shoulder Exercises Shoulder Flexion: Strengthening;Supine;Seated;15 reps Bar Weights/Barbell (Shoulder Flexion): 2 lbs Shoulder Protraction: Strengthening;Supine;Seated;15 reps Bar Weights/Barbell (Shoulder Protraction): 2 lbs Shoulder Horizontal ABduction: Strengthening;Supine;Seated;15 reps Bar Weights/Barbell (Shoulder Horizontal Abduction): 2 lbs Shoulder ABduction: Strengthening;Supine;Seated;15 reps Bar Weights/Barbell (Shoulder Abduction): 2 lbs Shoulder External Rotation: Strengthening;Supine;Seated;15 reps Bar Weights/Barbell (Shoulder External Rotation): 2 lbs Shoulder Internal Rotation: Strengthening;Supine;Seated;15 reps Bar Weights/Barbell (Shoulder Internal Rotation): 2 lbs Forearm Supination: Seated;Strengthening;15 reps Forearm Pronation: Strengthening;Seated;15 reps Wrist Flexion: Strengthening;Seated;15 reps Bar Weights/Barbell (Wrist Flexion): 2 lbs Wrist Extension: Strengthening;Seated;15 reps Bar Weights/Barbell (Wrist Extension): 2 lbs Additional ROM/Strengthening Exercises UBE (Upper Arm Bike): 3' and 3' 12.0 Cybex Leg Press: 1 plate W09 Cybex Row: 1 plate x 10 Wall Wash: with 1# Proximal Shoulder Strengthening - Seated: omitted, add 1 pound at next visit  Wrist Exercises Forearm Supination: Seated;Strengthening;15 reps Forearm Pronation: Strengthening;Seated;15 reps Wrist Flexion: Strengthening;Seated;15 reps Bar Weights/Barbell (Wrist Flexion): 2 lbs Wrist Extension: Strengthening;Seated;15 reps Bar Weights/Barbell  (Wrist Extension): 2 lbs Additional Wrist Exercises Theraputty - Flatten: green Theraputty - Roll: green Theraputty - Grip: green Theraputty - Locate Pegs: green 10 Hand Exercises Theraputty - Flatten: green Theraputty - Roll: green Theraputty - Grip: green Theraputty - Locate Pegs: green 10 Manual Therapy Manual Therapy: Myofascial release   Goals Long Term Goals Long Term Goal 1: Educate patient on HEP. Long Term Goal 2: Decrease pain to 1/10 when reaching overhead. Long Term Goal 3: Increase  AROM and PROM to [facility] for increasing independence with dressing. Long Term Goal 3 Progress: Progressing toward goal Long Term Goal 4: Increase right shoulder strength to 4+/5 for incresed independebnce. Long Term Goal 4 Progress: Progressing toward goal Long Term Goal 5: Return to PLOF. Long Term Goal 5 Progress: Progressing toward goal Additional Long Term Goals?: Yes Long Term Goal 6 Progress: Progressing toward goal Long Term Goal 7 Progress: Progressing toward goal Long Term Goal 8 Progress: Progressing toward goal Long Term Goal 9 Progress: Progressing toward goal End of Session Patient Active Problem List  Diagnoses  . CERVICAL CANCER  . DM  . HYPERLIPIDEMIA  . HYPERTENSION  . EXTERNAL HEMORRHOIDS WITHOUT MENTION COMP  . PNEUMONIA, ORGANISM UNSPECIFIED  . RECTAL BLEEDING  . COUGH  . Hyperlipidemia  . Hypertension  . Atypical chest pain  . Hypothyroidism  . Diabetes mellitus  . Carpal tunnel syndrome   End of Session Activity Tolerance: Patient tolerated treatment well OT Assessment and Plan Clinical Impression Statement: added cybex press and row increased to green putty.  added hand gripper with large beads which patient found very easy. Prognosis: Good OT Plan: add medium and small beads.   Aldine Grainger L. Noralee Stain, COTA/L  11/27/2010, 12:21 PM

## 2010-11-29 ENCOUNTER — Ambulatory Visit (HOSPITAL_COMMUNITY)
Admission: RE | Admit: 2010-11-29 | Discharge: 2010-11-29 | Disposition: A | Discharge status: Home / Self Care | Payer: Medicare PPO | Source: Ambulatory Visit | Attending: Specialist | Admitting: Specialist

## 2010-11-29 NOTE — Progress Notes (Signed)
Occupational Therapy Treatment  Patient Name: Sandra Cuevas MRN: [id number] Today's Date: 11/29/2010 Time In 1019 Time Out 1122  Manual Therapy 1019 Time 1046  Therapeutic Exercises 1047- 1122  Visit 34/40  Reassess on 12/04/2010   S: I can use my hand alot better now.  I can peel apples and potatos.   Pain Assessment Currently in Pain?: Yes Pain Score:   1 Pain Location: Shoulder Pain Orientation: Right  Exercise/Treatments Shoulder Exercises Shoulder Flexion: PROM;10 reps;Strengthening;Supine;Seated;15 reps (2 pounds) Shoulder Protraction: PROM;10 reps;Strengthening;Supine;Seated;15 reps (2 pounds) Shoulder Horizontal ABduction: PROM;10 reps;Strengthening;Supine;Seated;15 reps (2 pounds) Shoulder ABduction: PROM;10 reps;Strengthening;Supine;Seated;15 reps (2 pounds) Shoulder External Rotation: PROM;10 reps;Strengthening;Seated;Supine;15 reps (2 pounds) Shoulder Internal Rotation: PROM;10 reps;Strengthening;Supine;Seated;15 reps (2 pounds)  Additional ROM/Strengthening Exercises UBE (Upper Arm Bike): 3' and 3' 2.0 Cybex Chest Press: 1 plate N82 Cybex Row: 1 plate x 10 Wall Wash: with 1# Proximal Shoulder Strengthening - Seated: 1 # x 10 reps Wrist Exercises Forearm Supination: Strengthening;15 reps (2 pounds) Forearm Pronation: Strengthening;15 reps (2 pounds) Wrist Flexion: Strengthening;15 reps (2 pounds) Wrist Extension: Strengthening;15 reps (2 pounds) Additional Wrist Exercises Theraputty - Flatten: green Theraputty - Roll: green Theraputty - Grip: green Theraputty - Locate Pegs: green 10 Hand Gripper with Large Beads: begin next visit   Manual Therapy Manual Therapy: Other (comment) Myofascial Release: MFR and manual stretchng to right upper arm, scapular, flexor and extensor forearm, and hand to decrease fascial restrictions and increase mobility.  Other Manual Therapy: scar release to surgical scars on anterior right shoulder and right volar  wrist.   Goals Long Term Goals Long Term Goal 1: Educate patient on HEP. Long Term Goal 2: Decrease pain to 1/10 when reaching overhead. Long Term Goal 3: Increase  AROM and PROM to [facility] for increasing independence with dressing. Long Term Goal 4: Increase right shoulder strength to 4+/5 for incresed independebnce. Long Term Goal 5: Return to PLOF. Additional Long Term Goals?: Yes End of Session Patient Active Problem List  Diagnoses  . CERVICAL CANCER  . DM  . HYPERLIPIDEMIA  . HYPERTENSION  . EXTERNAL HEMORRHOIDS WITHOUT MENTION COMP  . PNEUMONIA, ORGANISM UNSPECIFIED  . RECTAL BLEEDING  . COUGH  . Hyperlipidemia  . Hypertension  . Atypical chest pain  . Hypothyroidism  . Diabetes mellitus  . Carpal tunnel syndrome   End of Session Activity Tolerance: Patient tolerated treatment well General Behavior During Session: [facility] for tasks performed Cognition: [facility] for tasks performed OT Assessment and Plan Clinical Impression Statement: A:  less restrictions noted in volar wrist, patient able to use right hand with IADLs.   OT Plan: REASSESS for monthly progress note.  Increase to 3 pounds with wrist exercises.     Shirlean Mylar, OTR/L  11/29/2010, 1:32 PM

## 2010-12-04 ENCOUNTER — Ambulatory Visit (HOSPITAL_COMMUNITY)
Admission: RE | Admit: 2010-12-04 | Discharge: 2010-12-04 | Disposition: A | Discharge status: Home / Self Care | Payer: Medicare PPO | Source: Ambulatory Visit | Attending: Specialist | Admitting: Specialist

## 2010-12-04 NOTE — Progress Notes (Signed)
Occupational Therapy Treatment  Patient Name: Sandra Cuevas MRN: [id number] Today's Date: 12/04/2010 Time In 1020 Time Out 1053  Manual Therapy 1020-1040 Reassessment 4098-1191  Visit 35/40  Reassessed today  S:  I don't have any problem lifting my full coffee cup into the microwave anymore. Pain Assessment Currently in Pain?: No/denies  Exercise/Treatments Shoulder Exercises Shoulder Flexion: PROM;10 reps;Supine Shoulder Protraction: Supine;PROM;10 reps Shoulder Horizontal ABduction: Supine;PROM;10 reps Shoulder ABduction: Supine;PROM;10 reps Shoulder External Rotation: Supine;PROM;10 reps Shoulder Internal Rotation: Supine;PROM;10 reps  Wrist Exercises Forearm Supination: Supine;PROM;10 reps Forearm Pronation: Supine;PROM;10 reps Wrist Flexion: Supine;PROM;10 reps Wrist Extension: Supine;PROM;10 reps Manual Therapy Manual Therapy: Myofascial release Myofascial Release: MFR and manual stretching to right upper arm, scapular, flexor and extensor forearm, and hand to decrease fascial restrictions and increase mobility. Other Manual Therapy: scar release to surgical scars on anterior right shoulder and right volar wrist.   Goals Long Term Goals Long Term Goal 1: Educate patient on HEP. Long Term Goal 1 Progress: Met Long Term Goal 2: Decrease pain to 1/10 when reaching overhead. Long Term Goal 2 Progress: Met Long Term Goal 3: Increase  AROM and PROM to [facility] for increasing independence with dressing. Long Term Goal 3 Progress: Met Long Term Goal 4: Increase right shoulder strength to 4+/5 for incresed independebnce. Long Term Goal 4 Progress: Met Long Term Goal 5: Return to PLOF. Long Term Goal 5 Progress: Met Additional Long Term Goals?: Yes Long Term Goal 6 Progress: Met Long Term Goal 7 Progress: Met Long Term Goal 8 Progress: Met Long Term Goal 9 Progress: Met End of Session Patient Active Problem List  Diagnoses  . CERVICAL CANCER  . DM  . HYPERLIPIDEMIA  .  HYPERTENSION  . EXTERNAL HEMORRHOIDS WITHOUT MENTION COMP  . PNEUMONIA, ORGANISM UNSPECIFIED  . RECTAL BLEEDING  . COUGH  . Hyperlipidemia  . Hypertension  . Atypical chest pain  . Hypothyroidism  . Diabetes mellitus  . Carpal tunnel syndrome   End of Session Activity Tolerance: Patient tolerated treatment well General Behavior During Session: [facility] for tasks performed Cognition: [facility] for tasks performed OT Assessment and Plan Clinical Impression Statement: A:  Please refer to discharge summary.  Mrs. Ganger has met or made significant progress towards all of her OT goals.  She is pleased with her progress and is transitioning to a HEP to include righ shoulder and wrist strengthening, grip strengthenin. OT Plan: DC, all goals met or made progress towards meeting.   Shirlean Mylar, OTR/L  12/04/2010, 2:04 PM

## 2010-12-06 ENCOUNTER — Ambulatory Visit (HOSPITAL_COMMUNITY): Payer: Medicare PPO | Admitting: Specialist

## 2011-03-06 ENCOUNTER — Encounter (INDEPENDENT_AMBULATORY_CARE_PROVIDER_SITE_OTHER): Payer: Medicare PPO | Admitting: Ophthalmology

## 2011-03-25 ENCOUNTER — Encounter: Payer: Self-pay | Admitting: Cardiovascular Disease

## 2011-03-25 ENCOUNTER — Ambulatory Visit (INDEPENDENT_AMBULATORY_CARE_PROVIDER_SITE_OTHER): Payer: Medicare PPO | Admitting: Cardiovascular Disease

## 2011-03-25 DIAGNOSIS — I10 Essential (primary) hypertension: Secondary | ICD-10-CM

## 2011-03-25 DIAGNOSIS — R0789 Other chest pain: Secondary | ICD-10-CM

## 2011-03-25 DIAGNOSIS — I452 Bifascicular block: Secondary | ICD-10-CM

## 2011-03-25 MED ORDER — LISINOPRIL 10 MG PO TABS: 10.0000 mg | ORAL_TABLET | Freq: Every day | ORAL | Status: DC

## 2011-03-25 MED ORDER — CARVEDILOL 25 MG PO TABS: 25.0000 mg | ORAL_TABLET | Freq: Two times a day (BID) | ORAL | Status: DC

## 2011-03-25 MED ORDER — AMLODIPINE BESYLATE 5 MG PO TABS: 5.0000 mg | ORAL_TABLET | Freq: Every day | ORAL | Status: DC

## 2011-03-25 MED ORDER — HYDROCHLOROTHIAZIDE 25 MG PO TABS: 25.0000 mg | ORAL_TABLET | Freq: Every day | ORAL | Status: DC

## 2011-03-25 NOTE — Assessment & Plan Note (Signed)
Her blood pressure is moderately elevated today. We'll start her on lisinopril 10 mg a day. I've asked her to mention that she does not eat any extra salt. I'll see her in 3 months for followup visit.

## 2011-03-25 NOTE — Patient Instructions (Signed)
Your physician recommends that you schedule a follow-up appointment in: 3 months   Your physician has recommended you make the following change in your medication:   START LISINOPRIL 10 mg daily   Your physician recommends that you return for lab work in: 3 months /BMET

## 2011-03-25 NOTE — Progress Notes (Signed)
Sandra Cuevas Date of Birth  1928-12-26 [facility] [address]    [address] [place], [place]  [address] [phone]  Fax  [phone]  History of Present Illness:  Sandra Cuevas is an 77 year old female with a history of atypical chest pains. She had a negative stress Myoview study in March of 2012. She has a history of asthma and uses albuterol on an as-needed basis. She continues to have shortness of breath with exertion.  He also has a history of hypertension.  Current Outpatient Prescriptions on File Prior to Visit  Medication Sig Dispense Refill  . albuterol (PROVENTIL HFA;VENTOLIN HFA) 108 (90 BASE) MCG/ACT inhaler Inhale 2 puffs into the lungs every 6 (six) hours as needed for wheezing.  1 Inhaler  12  . amLODipine (NORVASC) 5 MG tablet 1 tablet Daily.      Marland Kitchen aspirin 81 MG tablet Take 81 mg by mouth daily.        Marland Kitchen atorvastatin (LIPITOR) 10 MG tablet 1 tablet Daily.      . carvedilol (COREG) 25 MG tablet Take 1 tablet (25 mg total) by mouth 2 (two) times daily.  60 tablet  11  . Docusate Calcium (STOOL SOFTENER PO) Take 2 tablets by mouth daily.        Marland Kitchen guaiFENesin (MUCINEX) 600 MG 12 hr tablet Take 1,200 mg by mouth 2 (two) times daily.        . hydrochlorothiazide 25 MG tablet Take 1 tablet (25 mg total) by mouth daily.  90 tablet  3  . levothyroxine (SYNTHROID, LEVOTHROID) 50 MCG tablet Take 50 mcg by mouth daily.        Marland Kitchen omeprazole (PRILOSEC) 20 MG capsule Take 20 mg by mouth 2 (two) times daily.        . ONE TOUCH ULTRA TEST test strip as directed.      . polycarbophil (FIBERCON) 625 MG tablet Take 625 mg by mouth daily.        . potassium chloride (MICRO-K) 10 MEQ CR capsule 1 tablet Daily.      . sitaGLIPtan-metformin (JANUMET) 50-500 MG per tablet Take 1 tablet by mouth 2 (two) times daily with a meal.          No Known Allergies  Past Medical History  Diagnosis Date  . Hyperlipidemia   . Hypertension   . Atypical chest pain   . Hypothyroidism   .  Diabetes mellitus   . History of cardiovascular stress test 06/01/2010    EF 71% - no evidence of ischemia, normal left ventricular systolic function  . Shortness of breath     on exertion  . History of hysterectomy 1965    Past Surgical History  Procedure Date  . Cardiac catheterization 2001    EF=65%  . Hemorrhoid surgery     History  Smoking status  . Never Smoker   Smokeless tobacco  . Never Used    History  Alcohol Use No    Family History  Problem Relation Age of Onset  . Heart disease Father     heart problems  . Kidney failure Mother   . Diabetes Mother   . Cancer Brother   . Diabetes Brother     Reviw of Systems:  Reviewed in the HPI.  All other systems are negative.  Physical Exam: BP 177/71  Pulse 58  Ht 5\' 7"  (1.702 m)  Wt 221 lb (100.245 kg)  BMI 34.61 kg/m2 The patient is alert and  oriented x 3.  The mood and affect are normal.   Skin: warm and dry.  Color is normal.    HEENT:   Normocephalic/atraumatic.  Mucous membranes are moist. Her neck is supple.  Lungs: Lungs are clear to auscultation.   Heart: Regular rate S1-S2. She has no murmurs.    Abdomen: Shows good bowel sounds. There is no hepatomegaly.  Extremities:  No clubbing cyanosis or edema  Neuro:  The exam is nonfocal.    ECG: Normal sinus rhythm/sinus bradycardia. She has a right bundle branch block.  Assessment / Plan:

## 2011-05-03 ENCOUNTER — Telehealth: Payer: Self-pay | Admitting: Cardiovascular Disease

## 2011-05-03 NOTE — Telephone Encounter (Signed)
Pt pharm has changed to [facility] the fax# is [phone]

## 2011-05-03 NOTE — Telephone Encounter (Signed)
Changed Pharmacy in system.

## 2011-05-17 ENCOUNTER — Other Ambulatory Visit (HOSPITAL_COMMUNITY): Payer: Self-pay | Admitting: Family Medicine

## 2011-05-17 DIAGNOSIS — Z139 Encounter for screening, unspecified: Secondary | ICD-10-CM

## 2011-05-20 ENCOUNTER — Ambulatory Visit (INDEPENDENT_AMBULATORY_CARE_PROVIDER_SITE_OTHER): Payer: Medicare Other | Admitting: Ophthalmology

## 2011-05-20 DIAGNOSIS — H43819 Vitreous degeneration, unspecified eye: Secondary | ICD-10-CM

## 2011-05-20 DIAGNOSIS — H353290 Exudative age-related macular degeneration, unspecified eye, stage unspecified: Secondary | ICD-10-CM

## 2011-05-20 DIAGNOSIS — H3530 Unspecified macular degeneration: Secondary | ICD-10-CM

## 2011-05-20 DIAGNOSIS — H2510 Age-related nuclear cataract, unspecified eye: Secondary | ICD-10-CM

## 2011-05-27 ENCOUNTER — Ambulatory Visit (HOSPITAL_COMMUNITY)
Admission: RE | Admit: 2011-05-27 | Discharge: 2011-05-27 | Disposition: A | Discharge status: Home / Self Care | Payer: Medicare Other | Source: Ambulatory Visit | Attending: Family Medicine | Admitting: Family Medicine

## 2011-05-27 DIAGNOSIS — Z139 Encounter for screening, unspecified: Secondary | ICD-10-CM

## 2011-05-27 DIAGNOSIS — Z1231 Encounter for screening mammogram for malignant neoplasm of breast: Secondary | ICD-10-CM | POA: Insufficient documentation

## 2011-06-25 ENCOUNTER — Ambulatory Visit (INDEPENDENT_AMBULATORY_CARE_PROVIDER_SITE_OTHER): Payer: Medicare Other | Admitting: Cardiovascular Disease

## 2011-06-25 ENCOUNTER — Encounter: Payer: Self-pay | Admitting: Cardiovascular Disease

## 2011-06-25 VITALS — BP 150/60 | HR 59 | Ht 66.5 in | Wt 220.8 lb

## 2011-06-25 DIAGNOSIS — I10 Essential (primary) hypertension: Secondary | ICD-10-CM

## 2011-06-25 LAB — BASIC METABOLIC PANEL
CO2: 30 mEq/L (ref 19–32)
Chloride: 104 mEq/L (ref 96–112)
Creatinine, Ser: 0.8 mg/dL (ref 0.4–1.2)
Sodium: 142 mEq/L (ref 135–145)

## 2011-06-25 MED ORDER — LISINOPRIL 20 MG PO TABS
20.0000 mg | ORAL_TABLET | Freq: Every day | ORAL | Status: DC
Start: 2011-06-25 — End: 2012-02-18

## 2011-06-25 MED ORDER — CARVEDILOL 25 MG PO TABS: 25.0000 mg | ORAL_TABLET | Freq: Two times a day (BID) | ORAL | Status: DC

## 2011-06-25 NOTE — Assessment & Plan Note (Signed)
Ms. Ragin presents with continued BP elevation. We will increase Lisinopril to 20 mg a day.  Will see her in 3 months for OV and  BMP.

## 2011-06-25 NOTE — Patient Instructions (Signed)
Your physician has recommended you make the following change in your medication:  INCREASE LISINOPRIL FROM 10 MG TO 20 MG DAILY//TAKE TWO OF 10 MG TILL GONE THEN PICK UP NEW STRENGTH 20 MG DAILY     Your physician recommends that you schedule a follow-up appointment in: 3 MONTHS   Your physician recommends that you return for lab work in: TODAY AND IN 3 MONTHS BMET

## 2011-06-25 NOTE — Progress Notes (Signed)
Sandra Cuevas Date of Birth  [birth year] [facility] [address]    [address] [place], [place]  [address] [phone]  Fax  [phone]   Problems List: 1 HTN 2. Hyperlipidemia 3. Asthma 4. Hypothyroidism  History of Present Illness:  Sandra Cuevas is an 78 year old female with a history of atypical chest pains. She had a negative stress Myoview study in March of 2012. She has a history of asthma and uses albuterol on an as-needed basis. She continues to have shortness of breath with exertion.  He also has a history of hypertension.  She's been having lots of problems with her stomach recently. She's been having lots of bloating and nausea especially in the morning.  Current Outpatient Prescriptions on File Prior to Visit  Medication Sig Dispense Refill  . albuterol (PROVENTIL HFA;VENTOLIN HFA) 108 (90 BASE) MCG/ACT inhaler Inhale 2 puffs into the lungs every 6 (six) hours as needed for wheezing.  1 Inhaler  12  . amLODipine (NORVASC) 5 MG tablet Take 1 tablet (5 mg total) by mouth daily.  90 tablet  1  . aspirin 81 MG tablet Take 81 mg by mouth daily.        Marland Kitchen atorvastatin (LIPITOR) 10 MG tablet 1 tablet Daily.      . carvedilol (COREG) 25 MG tablet Take 1 tablet (25 mg total) by mouth 2 (two) times daily.  180 tablet  1  . Docusate Calcium (STOOL SOFTENER PO) Take 2 tablets by mouth daily.        Marland Kitchen guaiFENesin (MUCINEX) 600 MG 12 hr tablet Take 1,200 mg by mouth 2 (two) times daily.        . hydrochlorothiazide (HYDRODIURIL) 25 MG tablet Take 1 tablet (25 mg total) by mouth daily.  90 tablet  1  . levothyroxine (SYNTHROID, LEVOTHROID) 50 MCG tablet Take 50 mcg by mouth daily.        Marland Kitchen lisinopril (PRINIVIL,ZESTRIL) 10 MG tablet Take 1 tablet (10 mg total) by mouth daily.  90 tablet  1  . metFORMIN (GLUCOPHAGE) 500 MG tablet Take 1 tablet by mouth Twice daily.      Marland Kitchen omeprazole (PRILOSEC) 20 MG capsule Take 20 mg by mouth 2 (two) times daily.        . ONE TOUCH ULTRA  TEST test strip as directed.      . polycarbophil (FIBERCON) 625 MG tablet Take 625 mg by mouth daily.        . potassium chloride (MICRO-K) 10 MEQ CR capsule 1 tablet Daily.      . sitaGLIPtan-metformin (JANUMET) 50-500 MG per tablet Take 1 tablet by mouth 2 (two) times daily with a meal.          No Known Allergies  Past Medical History  Diagnosis Date  . Hyperlipidemia   . Hypertension   . Atypical chest pain   . Hypothyroidism   . Diabetes mellitus   . History of cardiovascular stress test 06/01/2010    EF 71% - no evidence of ischemia, normal left ventricular systolic function  . Shortness of breath     on exertion  . History of hysterectomy 1965    Past Surgical History  Procedure Date  . Cardiac catheterization 2001    EF=65%  . Hemorrhoid surgery     History  Smoking status  . Never Smoker   Smokeless tobacco  . Never Used    History  Alcohol Use No    Family History  Problem  Relation Age of Onset  . Heart disease Father     heart problems  . Kidney failure Mother   . Diabetes Mother   . Cancer Brother   . Diabetes Brother     Reviw of Systems:  Reviewed in the HPI.  All other systems are negative.  Physical Exam: BP 150/60  Pulse 59  Ht 5' 6.5" (1.689 m)  Wt 220 lb 12.8 oz (100.154 kg)  BMI 35.10 kg/m2 The patient is alert and oriented x 3.  The mood and affect are normal.   Skin: warm and dry.  Color is normal.    HEENT:   Normocephalic/atraumatic.  Mucous membranes are moist. Her neck is supple.  Lungs: Lungs are clear to auscultation.   Heart: Regular rate S1-S2. She has no murmurs.    Abdomen: Shows good bowel sounds. There is no hepatomegaly.  Extremities:  No clubbing cyanosis or edema  Neuro:  The exam is nonfocal.    ECG:   Assessment / Plan:

## 2011-09-26 ENCOUNTER — Encounter: Payer: Self-pay | Admitting: Cardiovascular Disease

## 2011-09-26 ENCOUNTER — Ambulatory Visit (INDEPENDENT_AMBULATORY_CARE_PROVIDER_SITE_OTHER): Payer: Medicare Other | Admitting: Cardiovascular Disease

## 2011-09-26 ENCOUNTER — Ambulatory Visit (HOSPITAL_COMMUNITY): Payer: Medicare Other | Attending: Cardiology | Admitting: Radiology

## 2011-09-26 VITALS — BP 138/64 | HR 60 | Ht 66.5 in | Wt 216.1 lb

## 2011-09-26 DIAGNOSIS — R0602 Shortness of breath: Secondary | ICD-10-CM

## 2011-09-26 DIAGNOSIS — R0609 Other forms of dyspnea: Secondary | ICD-10-CM | POA: Insufficient documentation

## 2011-09-26 DIAGNOSIS — E669 Obesity, unspecified: Secondary | ICD-10-CM | POA: Insufficient documentation

## 2011-09-26 DIAGNOSIS — I517 Cardiomegaly: Secondary | ICD-10-CM | POA: Insufficient documentation

## 2011-09-26 DIAGNOSIS — I10 Essential (primary) hypertension: Secondary | ICD-10-CM | POA: Insufficient documentation

## 2011-09-26 DIAGNOSIS — E119 Type 2 diabetes mellitus without complications: Secondary | ICD-10-CM | POA: Insufficient documentation

## 2011-09-26 DIAGNOSIS — J45909 Unspecified asthma, uncomplicated: Secondary | ICD-10-CM | POA: Insufficient documentation

## 2011-09-26 DIAGNOSIS — I2510 Atherosclerotic heart disease of native coronary artery without angina pectoris: Secondary | ICD-10-CM

## 2011-09-26 DIAGNOSIS — R0989 Other specified symptoms and signs involving the circulatory and respiratory systems: Secondary | ICD-10-CM | POA: Insufficient documentation

## 2011-09-26 DIAGNOSIS — R072 Precordial pain: Secondary | ICD-10-CM | POA: Insufficient documentation

## 2011-09-26 MED ORDER — CARVEDILOL 25 MG PO TABS: 25.0000 mg | ORAL_TABLET | Freq: Two times a day (BID) | ORAL | Status: DC

## 2011-09-26 NOTE — Progress Notes (Signed)
Sandra Cuevas Date of Birth  Dec 17, 1928       [facility]    [place] [address]  [address] [place], [place]  [address]   [place], [place]  [address] [phone]     [phone]   Fax  [phone]    Fax [phone]  Problem List: 1. Chest pain-normal stress Myoview study 2. Dyspnea 3. Hypertension 4. Diabetes Mellitus   History of Present Illness: Sandra Cuevas is an 78 year old female with a history of atypical chest pains. She had a negative stress Myoview study in March of 2012. She has a history of asthma and uses albuterol on an as-needed basis. She continues to have shortness of breath with exertion. He also has a history of hypertension.  She complains of chronic dyspnea.   She also has lots of back pain.  Current Outpatient Prescriptions on File Prior to Visit  Medication Sig Dispense Refill  . amLODipine (NORVASC) 5 MG tablet Take 1 tablet (5 mg total) by mouth daily.  90 tablet  1  . aspirin 81 MG tablet Take 81 mg by mouth daily.        Marland Kitchen atorvastatin (LIPITOR) 10 MG tablet 1 tablet Daily.      Tery Sanfilippo Calcium (STOOL SOFTENER PO) Take 2 tablets by mouth daily.        Marland Kitchen guaiFENesin (MUCINEX) 600 MG 12 hr tablet Take 1,200 mg by mouth 2 (two) times daily.        . hydrochlorothiazide (HYDRODIURIL) 25 MG tablet Take 1 tablet (25 mg total) by mouth daily.  90 tablet  1  . levothyroxine (SYNTHROID, LEVOTHROID) 50 MCG tablet Take 50 mcg by mouth daily.        Marland Kitchen lisinopril (PRINIVIL,ZESTRIL) 20 MG tablet Take 1 tablet (20 mg total) by mouth daily.  90 tablet  1  . metFORMIN (GLUCOPHAGE) 500 MG tablet Take 1 tablet by mouth Twice daily.      Marland Kitchen omeprazole (PRILOSEC) 20 MG capsule Take 20 mg by mouth 2 (two) times daily.        . ONE TOUCH ULTRA TEST test strip as directed.      . polycarbophil (FIBERCON) 625 MG tablet Take 625 mg by mouth daily.        . potassium chloride (MICRO-K) 10 MEQ CR capsule 1 tablet Daily.        . sitaGLIPtan-metformin (JANUMET) 50-500 MG per tablet Take 1 tablet by mouth 2 (two) times daily with a meal.        . DISCONTD: carvedilol (COREG) 25 MG tablet Take 1 tablet (25 mg total) by mouth 2 (two) times daily.  90 tablet  3  . albuterol (PROVENTIL HFA;VENTOLIN HFA) 108 (90 BASE) MCG/ACT inhaler Inhale 2 puffs into the lungs every 6 (six) hours as needed for wheezing.  1 Inhaler  12    No Known Allergies  Past Medical History  Diagnosis Date  . Hyperlipidemia   . Hypertension   . Atypical chest pain   . Hypothyroidism   . Diabetes mellitus   . History of cardiovascular stress test 06/01/2010    EF 71% - no evidence of ischemia, normal left ventricular systolic function  . Shortness of breath     on exertion  . History of hysterectomy 1965    Past Surgical History  Procedure Date  . Cardiac catheterization 2001    EF=65%  . Hemorrhoid surgery     History  Smoking status  . Never Smoker   Smokeless tobacco  . Never Used    History  Alcohol Use No    Family History  Problem Relation Age of Onset  . Heart disease Father     heart problems  . Kidney failure Mother   . Diabetes Mother   . Cancer Brother   . Diabetes Brother     Reviw of Systems:  Reviewed in the HPI.  All other systems are negative.  Physical Exam: Blood pressure 138/64, pulse 60, height 5' 6.5" (1.689 m), weight 216 lb 1.9 oz (98.031 kg). General: Well developed, well nourished, in no acute distress.  Head: Normocephalic, atraumatic, sclera non-icteric, mucus membranes are moist,   Neck: Supple. Carotids are 2 + without bruits. No JVD  Lungs: Clear bilaterally to auscultation.  Heart: regular rate.  normal  S1 S2. No murmurs, gallops or rubs.  Abdomen: Soft, non-tender, non-distended with normal bowel sounds. No hepatomegaly. No rebound/guarding. No masses.  Msk:  Strength and tone are normal  Extremities: No clubbing or cyanosis. No edema.  Distal pedal pulses are 2+ and equal  bilaterally.  Neuro: Alert and oriented X 3. Moves all extremities spontaneously.  Psych:  Responds to questions appropriately with a normal affect.  ECG:  Assessment / Plan:

## 2011-09-26 NOTE — Patient Instructions (Signed)
Your physician has requested that you have an echocardiogram. Echocardiography is a painless test that uses sound waves to create images of your heart. It provides your doctor with information about the size and shape of your heart and how well your heart's chambers and valves are working. This procedure takes approximately one hour. There are no restrictions for this procedure.   Your physician wants you to follow-up in: 6 months. You will receive a reminder letter in the mail two months in advance. If you don't receive a letter, please call our office to schedule the follow-up appointment.  Your physician recommends that you continue on your current medications as directed. Please refer to the Current Medication list given to you today.   

## 2011-09-26 NOTE — Assessment & Plan Note (Addendum)
[name] presents with persistent shortness of breath.  Her dyspnea seems to be increasing over the past several months.  This may be due to  deconditioning. I would like to get an echocardiogram to evaluate her for the possibility of left ventricular diastolic dysfunction and perhaps pulmonary hypertension. I'll see her back in 6 months. I've encouraged her to exercise on regular basis. She has lots of back pain that limits her exercise capacity. I've asked her to consider doing water aerobics or other exercises that will bother her back as much.

## 2011-09-26 NOTE — Progress Notes (Signed)
Echocardiogram performed.  

## 2011-11-08 ENCOUNTER — Telehealth: Payer: Self-pay | Admitting: *Deleted

## 2011-11-08 MED ORDER — ALBUTEROL SULFATE HFA 108 (90 BASE) MCG/ACT IN AERS: 2.0000 | INHALATION_SPRAY | Freq: Four times a day (QID) | RESPIRATORY_TRACT | Status: DC | PRN

## 2011-11-08 NOTE — Telephone Encounter (Signed)
Pt requested refill of inhaler, explained to her that if insurance denies she will need pcp or pulmonologist to refill, pt agreed to plan.

## 2011-11-18 ENCOUNTER — Other Ambulatory Visit (HOSPITAL_COMMUNITY): Payer: Self-pay | Admitting: Family Medicine

## 2011-11-18 ENCOUNTER — Ambulatory Visit (INDEPENDENT_AMBULATORY_CARE_PROVIDER_SITE_OTHER): Payer: Medicare Other | Admitting: Ophthalmology

## 2011-11-18 ENCOUNTER — Ambulatory Visit (HOSPITAL_COMMUNITY)
Admission: RE | Admit: 2011-11-18 | Discharge: 2011-11-18 | Disposition: A | Discharge status: Home / Self Care | Payer: Medicare Other | Source: Ambulatory Visit | Attending: Family Medicine | Admitting: Family Medicine

## 2011-11-18 DIAGNOSIS — M79609 Pain in unspecified limb: Secondary | ICD-10-CM | POA: Insufficient documentation

## 2011-11-18 DIAGNOSIS — M899 Disorder of bone, unspecified: Secondary | ICD-10-CM | POA: Insufficient documentation

## 2011-11-18 DIAGNOSIS — I10 Essential (primary) hypertension: Secondary | ICD-10-CM

## 2011-11-18 DIAGNOSIS — E1165 Type 2 diabetes mellitus with hyperglycemia: Secondary | ICD-10-CM

## 2011-11-18 DIAGNOSIS — M949 Disorder of cartilage, unspecified: Secondary | ICD-10-CM | POA: Insufficient documentation

## 2011-11-18 DIAGNOSIS — H35039 Hypertensive retinopathy, unspecified eye: Secondary | ICD-10-CM

## 2011-11-18 DIAGNOSIS — E1139 Type 2 diabetes mellitus with other diabetic ophthalmic complication: Secondary | ICD-10-CM

## 2011-11-18 DIAGNOSIS — H3530 Unspecified macular degeneration: Secondary | ICD-10-CM

## 2011-11-18 DIAGNOSIS — M25549 Pain in joints of unspecified hand: Secondary | ICD-10-CM

## 2011-11-18 DIAGNOSIS — H353290 Exudative age-related macular degeneration, unspecified eye, stage unspecified: Secondary | ICD-10-CM

## 2011-11-18 DIAGNOSIS — E11319 Type 2 diabetes mellitus with unspecified diabetic retinopathy without macular edema: Secondary | ICD-10-CM

## 2011-11-20 ENCOUNTER — Other Ambulatory Visit (HOSPITAL_COMMUNITY): Payer: Self-pay | Admitting: Family Medicine

## 2011-11-21 ENCOUNTER — Other Ambulatory Visit (HOSPITAL_COMMUNITY): Payer: Self-pay | Admitting: Family Medicine

## 2011-11-21 DIAGNOSIS — Z09 Encounter for follow-up examination after completed treatment for conditions other than malignant neoplasm: Secondary | ICD-10-CM

## 2011-11-22 ENCOUNTER — Ambulatory Visit (HOSPITAL_COMMUNITY)
Admission: RE | Admit: 2011-11-22 | Discharge: 2011-11-22 | Disposition: A | Discharge status: Home / Self Care | Payer: Medicare Other | Source: Ambulatory Visit | Attending: Family Medicine | Admitting: Family Medicine

## 2011-11-22 DIAGNOSIS — Z09 Encounter for follow-up examination after completed treatment for conditions other than malignant neoplasm: Secondary | ICD-10-CM

## 2011-11-22 DIAGNOSIS — I6529 Occlusion and stenosis of unspecified carotid artery: Secondary | ICD-10-CM | POA: Insufficient documentation

## 2011-11-22 DIAGNOSIS — R42 Dizziness and giddiness: Secondary | ICD-10-CM | POA: Insufficient documentation

## 2011-12-17 ENCOUNTER — Other Ambulatory Visit (HOSPITAL_COMMUNITY): Payer: Self-pay | Admitting: Family Medicine

## 2011-12-17 ENCOUNTER — Ambulatory Visit (HOSPITAL_COMMUNITY)
Admission: RE | Admit: 2011-12-17 | Discharge: 2011-12-17 | Disposition: A | Discharge status: Home / Self Care | Payer: Medicare Other | Source: Ambulatory Visit | Attending: Family Medicine | Admitting: Family Medicine

## 2011-12-17 DIAGNOSIS — M545 Low back pain, unspecified: Secondary | ICD-10-CM | POA: Insufficient documentation

## 2011-12-17 DIAGNOSIS — M5137 Other intervertebral disc degeneration, lumbosacral region: Secondary | ICD-10-CM

## 2011-12-17 DIAGNOSIS — M51379 Other intervertebral disc degeneration, lumbosacral region without mention of lumbar back pain or lower extremity pain: Secondary | ICD-10-CM | POA: Insufficient documentation

## 2011-12-19 ENCOUNTER — Encounter (INDEPENDENT_AMBULATORY_CARE_PROVIDER_SITE_OTHER): Payer: Self-pay | Admitting: *Deleted

## 2012-01-06 ENCOUNTER — Encounter (INDEPENDENT_AMBULATORY_CARE_PROVIDER_SITE_OTHER): Payer: Self-pay | Admitting: Internal Medicine

## 2012-01-06 ENCOUNTER — Ambulatory Visit (INDEPENDENT_AMBULATORY_CARE_PROVIDER_SITE_OTHER): Payer: Medicare Other | Admitting: Internal Medicine

## 2012-01-06 VITALS — BP 150/60 | HR 68 | Temp 97.7°F | Ht 66.0 in | Wt 214.4 lb

## 2012-01-06 DIAGNOSIS — R195 Other fecal abnormalities: Secondary | ICD-10-CM | POA: Insufficient documentation

## 2012-01-06 NOTE — Patient Instructions (Addendum)
Fiber tabs  4 gms day.  OV in 1 month. Stool diary. Stop the fibercon and stool softner for now. If you become constipated, start the stool softner back.

## 2012-01-06 NOTE — Progress Notes (Signed)
Subjective:     Patient ID: Sandra Cuevas, female   DOB: 1929/01/07, 78 y.o.   MRN: [id number]  HPIReferred to our office for change in her stools. She tells me her stools were smaller and she had a lot of gas. She continues to have a lot of gas. Stools are pencil thin. She has not had a normal stool in over a month.  Sometimes she feels like she has to go but all she passes is gas. Her last colonoscopy was 2 yrs in [place] which was normal. She did have one polyp.   Dr. Arlyce Dice.  Biopsy:. FINAL DIAGNOSIS  1. Colon, polyp(s), descending : TUBULAR ADENOMA. NO HIGH GRADE DYSPLASIA OR MALIGNANCY IDENTIFIED.  DATE SIGNED OUT: 03/07/2010 ELECTRONIC SIGNATURE : Jacelyn Grip, John, Pathologist, Electronic Signature  Appetite is good at times.Sometimes she feels nauseated. She usually has a BM about once a day or  2-3 times a day. No melena or bright red rectal bleeding. No abdominal pain.   Review of Systems See hpi Current Outpatient Prescriptions  Medication Sig Dispense Refill  . aspirin 81 MG tablet Take 81 mg by mouth daily.        Marland Kitchen atorvastatin (LIPITOR) 10 MG tablet 1 tablet Daily.      . carvedilol (COREG) 25 MG tablet Take 1 tablet (25 mg total) by mouth 2 (two) times daily.  90 tablet  3  . Docusate Calcium (STOOL SOFTENER PO) Take 2 tablets by mouth daily.        Marland Kitchen guaiFENesin (MUCINEX) 600 MG 12 hr tablet Take 1,200 mg by mouth 2 (two) times daily.        Marland Kitchen levothyroxine (SYNTHROID, LEVOTHROID) 50 MCG tablet Take 50 mcg by mouth daily.        Marland Kitchen lisinopril (PRINIVIL,ZESTRIL) 20 MG tablet Take 1 tablet (20 mg total) by mouth daily.  90 tablet  1  . metFORMIN (GLUCOPHAGE) 500 MG tablet Take 1 tablet by mouth Twice daily.      Marland Kitchen omeprazole (PRILOSEC) 20 MG capsule Take 20 mg by mouth 2 (two) times daily.        . polycarbophil (FIBERCON) 625 MG tablet Take 625 mg by mouth daily.        . potassium chloride (MICRO-K) 10 MEQ CR capsule 1 tablet Daily.      . sitaGLIPtan-metformin  (JANUMET) 50-500 MG per tablet Take 1 tablet by mouth 2 (two) times daily with a meal.        . albuterol (PROVENTIL HFA;VENTOLIN HFA) 108 (90 BASE) MCG/ACT inhaler Inhale 2 puffs into the lungs every 6 (six) hours as needed for wheezing.  1 Inhaler  12  . albuterol (PROVENTIL HFA;VENTOLIN HFA) 108 (90 BASE) MCG/ACT inhaler Inhale 2 puffs into the lungs every 6 (six) hours as needed for wheezing.  3 Inhaler  3  . amLODipine (NORVASC) 5 MG tablet Take 1 tablet (5 mg total) by mouth daily.  90 tablet  1  . hydrochlorothiazide (HYDRODIURIL) 25 MG tablet Take 1 tablet (25 mg total) by mouth daily.  90 tablet  1  . ONE TOUCH ULTRA TEST test strip as directed.       Past Medical History  Diagnosis Date  . Hyperlipidemia   . Hypertension   . Atypical chest pain   . Hypothyroidism   . Diabetes mellitus   . History of cardiovascular stress test 06/01/2010    EF 71% - no evidence of ischemia, normal left ventricular systolic function  . Shortness  of breath     on exertion  . History of hysterectomy 1965   Past Surgical History  Procedure Date  . Cardiac catheterization 2001    EF=65%  . Hemorrhoid surgery    History   Social History  . Marital Status: Married    Spouse Name: N/A    Number of Children: N/A  . Years of Education: N/A   Occupational History  . Not on file.   Social History Main Topics  . Smoking status: Never Smoker   . Smokeless tobacco: Never Used  . Alcohol Use: No  . Drug Use: No  . Sexually Active: No   Other Topics Concern  . Not on file   Social History Narrative  . No narrative on file   Family Status  Relation Status Death Age  . Father Deceased 80    with heart problems  . Mother Deceased 27    diabetes and kidney failure   No Known Allergies     Objective:   Physical Exam Filed Vitals:   01/06/12 1522  BP: 150/60  Pulse: 68  Temp: 97.7 F (36.5 C)  Height: 5\' 6"  (1.676 m)  Weight: 214 lb 6.4 oz (97.251 kg)   Alert and oriented. Skin  warm and dry. Oral mucosa is moist.   . Sclera anicteric, conjunctivae is pink. Thyroid not enlarged. No cervical lymphadenopathy. Lungs clear. Heart regular rate and rhythm.  Abdomen is soft. Bowel sounds are positive. No hepatomegaly. No abdominal masses felt. No tenderness.  No edema to lower extremities. Stool brown and guaiac negative.    Assessment:   Change in stools.  Stools are smaller, but formed. Recent colonoscopy in 2011 by Dr. Arlyce Dice with a tubular adenoma.    Plan:    Fiber 4 tabs daily. Stools diary. OV in 1 month. Stop stool softner and Fibercon

## 2012-01-31 ENCOUNTER — Encounter (INDEPENDENT_AMBULATORY_CARE_PROVIDER_SITE_OTHER): Payer: Self-pay | Admitting: *Deleted

## 2012-02-03 ENCOUNTER — Ambulatory Visit (INDEPENDENT_AMBULATORY_CARE_PROVIDER_SITE_OTHER): Payer: Medicare Other | Admitting: Internal Medicine

## 2012-02-04 ENCOUNTER — Encounter: Payer: Self-pay | Admitting: Nurse Practitioner

## 2012-02-04 ENCOUNTER — Ambulatory Visit (INDEPENDENT_AMBULATORY_CARE_PROVIDER_SITE_OTHER): Payer: Medicare Other | Admitting: Nurse Practitioner

## 2012-02-04 VITALS — BP 120/80 | HR 60 | Ht 66.0 in | Wt 214.4 lb

## 2012-02-04 DIAGNOSIS — I779 Disorder of arteries and arterioles, unspecified: Secondary | ICD-10-CM

## 2012-02-04 NOTE — Progress Notes (Addendum)
Sandra Cuevas Date of Birth: March 27, 1929 Medical Record #[id number]  History of Present Illness: Ms. Simoni is seen back today for a follow up visit. She is seen for Dr. Elease Hashimoto. She has HTN, HLD and DM. Negative Myoview in 2012. Her other problems are as noted below. She has had chronic back pain. She has most recently been having more GI issues with change in her stool.   She comes in today. She is here with her husband. She does not really know why she is here. Had a carotid doppler back in August. Has bilateral disease noted. Results are as below. She says she feels about the same. No chest pain. Remains short of breath and she feels it is because she is so deconditioned. She is quite limited by her back. No stroke symptoms reported. She remains on aspirin and is on statin therapy.   Current Outpatient Prescriptions on File Prior to Visit  Medication Sig Dispense Refill  . amLODipine (NORVASC) 5 MG tablet Take 1 tablet (5 mg total) by mouth daily.  90 tablet  1  . aspirin 81 MG tablet Take 81 mg by mouth daily.        Marland Kitchen atorvastatin (LIPITOR) 10 MG tablet 1 tablet Daily.      . carvedilol (COREG) 25 MG tablet Take 1 tablet (25 mg total) by mouth 2 (two) times daily.  90 tablet  3  . Docusate Calcium (STOOL SOFTENER PO) Take 2 tablets by mouth daily.        . hydrochlorothiazide (HYDRODIURIL) 25 MG tablet Take 1 tablet (25 mg total) by mouth daily.  90 tablet  1  . levothyroxine (SYNTHROID, LEVOTHROID) 50 MCG tablet Take 50 mcg by mouth daily.        Marland Kitchen lisinopril (PRINIVIL,ZESTRIL) 20 MG tablet Take 1 tablet (20 mg total) by mouth daily.  90 tablet  1  . omeprazole (PRILOSEC) 20 MG capsule Take 20 mg by mouth 2 (two) times daily.        . ONE TOUCH ULTRA TEST test strip as directed.      . polycarbophil (FIBERCON) 625 MG tablet Take 625 mg by mouth daily.        . potassium chloride (MICRO-K) 10 MEQ CR capsule 1 tablet Daily.      Marland Kitchen albuterol (PROVENTIL HFA;VENTOLIN HFA) 108 (90 BASE)  MCG/ACT inhaler Inhale 2 puffs into the lungs every 6 (six) hours as needed for wheezing.  1 Inhaler  12  . DISCONTD: albuterol (PROVENTIL HFA;VENTOLIN HFA) 108 (90 BASE) MCG/ACT inhaler Inhale 2 puffs into the lungs every 6 (six) hours as needed for wheezing.  3 Inhaler  3    No Known Allergies  Past Medical History  Diagnosis Date  . Hyperlipidemia   . Hypertension   . Atypical chest pain   . Hypothyroidism   . Diabetes mellitus   . History of cardiovascular stress test 06/01/2010    EF 71% - no evidence of ischemia, normal left ventricular systolic function  . Shortness of breath     on exertion  . History of hysterectomy 1965    Past Surgical History  Procedure Date  . Cardiac catheterization 2001    EF=65%  . Hemorrhoid surgery     History  Smoking status  . Never Smoker   Smokeless tobacco  . Never Used    History  Alcohol Use No    Family History  Problem Relation Age of Onset  . Heart disease Father  heart problems  . Kidney failure Mother   . Diabetes Mother   . Cancer Brother   . Diabetes Brother     Review of Systems: The review of systems is per the HPI.  All other systems were reviewed and are negative.  Physical Exam: BP 120/80  Pulse 60  Ht 5\' 6"  (1.676 m)  Wt 214 lb 6.4 oz (97.251 kg)  BMI 34.60 kg/m2 Patient is very pleasant and in no acute distress. She is obese. Skin is warm and dry. Color is normal.  HEENT is unremarkable but she does have bilateral carotid bruits. . Normocephalic/atraumatic. PERRL. Sclera are nonicteric. Neck is supple. No masses. No JVD. Lungs are clear. Cardiac exam shows a regular rate and rhythm. Abdomen is soft. Extremities are without edema. Gait and ROM are intact. No gross neurologic deficits noted.  LABORATORY DATA: N/A  CAROTID DUPLEX IMPRESSION: Less than 50% stenosis in the right internal carotid artery with some plaque.  Stable 50-69% stenosis in the left internal carotid artery with significant  calcified plaque.  Increasing velocity in the left external carotid artery compatible with significant narrowing.  Original Report Authenticated By: Donavan Burnet, M.D.      Assessment / Plan: 1. Carotid disease - She has 50 % on the R and 50 to 69% on the left. Has significant narrowing in the left external which would not be treated. I would advise repeat doppler in February. She is advised of stroke symptoms to be on the lookout for. She will continue her aspirin and statin therapy. We can schedule this on her return visit.   2. HLD - on Lipitor. Labs per her PCP.  I think she is doing well. We will see her back as scheduled in December with Dr. Elease Hashimoto.   Patient is agreeable to this plan and will call if any problems develop in the interim.

## 2012-02-04 NOTE — Patient Instructions (Signed)
Lets get your visit with Dr. Elease Hashimoto for December set up.  Stay on your current medicines  We will plan on repeating your doppler on your neck in February  Call the [facility] office at [phone] if you have any questions, problems or concerns.

## 2012-02-10 ENCOUNTER — Encounter: Payer: Self-pay | Admitting: Cardiovascular Disease

## 2012-02-18 ENCOUNTER — Other Ambulatory Visit: Payer: Self-pay | Admitting: *Deleted

## 2012-02-18 DIAGNOSIS — I10 Essential (primary) hypertension: Secondary | ICD-10-CM

## 2012-02-18 MED ORDER — LISINOPRIL 20 MG PO TABS
20.0000 mg | ORAL_TABLET | Freq: Every day | ORAL | Status: DC
Start: 2012-02-18 — End: 2013-01-29

## 2012-02-18 NOTE — Telephone Encounter (Signed)
Fax Received. Refill Completed. Sandra Cuevas (R.M.A)   

## 2012-02-24 ENCOUNTER — Other Ambulatory Visit: Payer: Self-pay | Admitting: *Deleted

## 2012-02-24 NOTE — Telephone Encounter (Signed)
Opened in Error.

## 2012-03-16 ENCOUNTER — Encounter: Payer: Self-pay | Admitting: Cardiovascular Disease

## 2012-03-16 ENCOUNTER — Ambulatory Visit (INDEPENDENT_AMBULATORY_CARE_PROVIDER_SITE_OTHER): Payer: Medicare Other | Admitting: Cardiovascular Disease

## 2012-03-16 VITALS — BP 134/56 | HR 60 | Ht 66.0 in | Wt 218.4 lb

## 2012-03-16 DIAGNOSIS — I10 Essential (primary) hypertension: Secondary | ICD-10-CM

## 2012-03-16 NOTE — Progress Notes (Signed)
Sandra Cuevas Date of Birth  06-Sep-1928       [facility]    [place] [address]  [address] [place], [place]  [address]   [place], [place]  [address] [phone]     [phone]   Fax  [phone]    Fax [phone]  Problem List: 1. Chest pain-normal stress Myoview study 2. Dyspnea 3. Hypertension 4. Diabetes Mellitus 5. Carotid bruit IMPRESSION: Less than 50% stenosis in the right internal carotid artery with some plaque.  Stable 50-69% stenosis in the left internal carotid artery with significant calcified plaque.  Increasing velocity in the left external carotid artery compatible with significant narrowing.  6. RBBB   History of Present Illness: Sandra Cuevas is an 78 year old female with a history of atypical chest pains. She had a negative stress Myoview study in March of 2012. She has a history of asthma and uses albuterol on an as-needed basis. She continues to have shortness of breath with exertion. He also has a history of hypertension.  She complains of chronic dyspnea.   She also has lots of back pain.  Current Outpatient Prescriptions on File Prior to Visit  Medication Sig Dispense Refill  . amLODipine (NORVASC) 5 MG tablet Take 1 tablet (5 mg total) by mouth daily.  90 tablet  1  . aspirin 81 MG tablet Take 81 mg by mouth daily.        Marland Kitchen atorvastatin (LIPITOR) 10 MG tablet 1 tablet Daily.      . carvedilol (COREG) 25 MG tablet Take 1 tablet (25 mg total) by mouth 2 (two) times daily.  90 tablet  3  . Docusate Calcium (STOOL SOFTENER PO) Take 2 tablets by mouth daily.        . hydrochlorothiazide (HYDRODIURIL) 25 MG tablet Take 1 tablet (25 mg total) by mouth daily.  90 tablet  1  . levothyroxine (SYNTHROID, LEVOTHROID) 50 MCG tablet Take 50 mcg by mouth daily.        Marland Kitchen lisinopril (PRINIVIL,ZESTRIL) 20 MG tablet Take 1 tablet (20 mg total) by mouth daily.  90 tablet  3  . omeprazole (PRILOSEC) 20 MG capsule  Take 20 mg by mouth 2 (two) times daily.        . ONE TOUCH ULTRA TEST test strip as directed.      . polycarbophil (FIBERCON) 625 MG tablet Take 625 mg by mouth daily.        . potassium chloride (MICRO-K) 10 MEQ CR capsule 1 tablet Daily.      Marland Kitchen albuterol (PROVENTIL HFA;VENTOLIN HFA) 108 (90 BASE) MCG/ACT inhaler Inhale 2 puffs into the lungs every 6 (six) hours as needed for wheezing.  1 Inhaler  12    No Known Allergies  Past Medical History  Diagnosis Date  . Hyperlipidemia   . Hypertension   . Atypical chest pain   . Hypothyroidism   . Diabetes mellitus   . History of cardiovascular stress test 06/01/2010    EF 71% - no evidence of ischemia, normal left ventricular systolic function  . Shortness of breath     on exertion  . History of hysterectomy 1965    Past Surgical History  Procedure Date  . Cardiac catheterization 2001    EF=65%  . Hemorrhoid surgery     History  Smoking status  . Never Smoker   Smokeless tobacco  . Never Used    History  Alcohol Use No  Family History  Problem Relation Age of Onset  . Heart disease Father     heart problems  . Kidney failure Mother   . Diabetes Mother   . Cancer Brother   . Diabetes Brother     Reviw of Systems:  Reviewed in the HPI.  All other systems are negative.  Physical Exam: Blood pressure 134/56, pulse 60, height 5\' 6"  (1.676 m), weight 218 lb 6.4 oz (99.066 kg). General: Well developed, well nourished, in no acute distress.  Head: Normocephalic, atraumatic, sclera non-icteric, mucus membranes are moist,   Neck: Supple. Carotids are 2 + without bruits. No JVD  Lungs: Clear bilaterally to auscultation.  Heart: regular rate.  normal  S1 S2. No murmurs, gallops or rubs.  Abdomen: Soft, non-tender, non-distended with normal bowel sounds. No hepatomegaly. No rebound/guarding. No masses.  Msk:  Strength and tone are normal  Extremities: No clubbing or cyanosis. No edema.  Distal pedal pulses are 2+  and equal bilaterally.  Neuro: Alert and oriented X 3. Moves all extremities spontaneously.  Psych:  Responds to questions appropriately with a normal affect.  ECG: Dec. 9, 2013 -NSR at 60. RBBB. No changes from previous tracing  Assessment / Plan:

## 2012-03-16 NOTE — Assessment & Plan Note (Signed)
Ms. Purdie is doing well.  We will continue her current meds.  I will see her in a year.

## 2012-03-16 NOTE — Patient Instructions (Addendum)
Your physician wants you to follow-up in: 1 year  You will receive a reminder letter in the mail two months in advance. If you don't receive a letter, please call our office to schedule the follow-up appointment.  Your physician recommends that you continue on your current medications as directed. Please refer to the Current Medication list given to you today.  

## 2012-05-20 ENCOUNTER — Ambulatory Visit (INDEPENDENT_AMBULATORY_CARE_PROVIDER_SITE_OTHER): Payer: Medicare Other | Admitting: Ophthalmology

## 2012-05-20 DIAGNOSIS — E11319 Type 2 diabetes mellitus with unspecified diabetic retinopathy without macular edema: Secondary | ICD-10-CM

## 2012-05-20 DIAGNOSIS — E1139 Type 2 diabetes mellitus with other diabetic ophthalmic complication: Secondary | ICD-10-CM

## 2012-05-20 DIAGNOSIS — H353290 Exudative age-related macular degeneration, unspecified eye, stage unspecified: Secondary | ICD-10-CM

## 2012-05-20 DIAGNOSIS — H35039 Hypertensive retinopathy, unspecified eye: Secondary | ICD-10-CM

## 2012-05-20 DIAGNOSIS — I10 Essential (primary) hypertension: Secondary | ICD-10-CM

## 2012-05-20 DIAGNOSIS — H43819 Vitreous degeneration, unspecified eye: Secondary | ICD-10-CM

## 2012-05-20 DIAGNOSIS — H2510 Age-related nuclear cataract, unspecified eye: Secondary | ICD-10-CM

## 2012-06-18 ENCOUNTER — Encounter (INDEPENDENT_AMBULATORY_CARE_PROVIDER_SITE_OTHER): Payer: Medicare Other | Admitting: Ophthalmology

## 2012-06-18 DIAGNOSIS — I10 Essential (primary) hypertension: Secondary | ICD-10-CM

## 2012-06-24 ENCOUNTER — Other Ambulatory Visit: Payer: Self-pay | Admitting: *Deleted

## 2012-06-24 NOTE — Telephone Encounter (Signed)
Opened in Error.

## 2012-06-24 NOTE — Telephone Encounter (Signed)
Lm for pt to call office back to verify pharmacy so we can send her carvedilol out.

## 2012-06-26 ENCOUNTER — Other Ambulatory Visit: Payer: Self-pay | Admitting: *Deleted

## 2012-06-26 DIAGNOSIS — I10 Essential (primary) hypertension: Secondary | ICD-10-CM

## 2012-06-26 MED ORDER — CARVEDILOL 25 MG PO TABS: 25.0000 mg | ORAL_TABLET | Freq: Two times a day (BID) | ORAL | Status: DC

## 2012-06-26 NOTE — Telephone Encounter (Signed)
Fax Received. Refill Completed. Sandra Cuevas (R.M.A)   

## 2012-07-28 ENCOUNTER — Encounter (INDEPENDENT_AMBULATORY_CARE_PROVIDER_SITE_OTHER): Payer: Medicare Other | Admitting: Ophthalmology

## 2012-07-28 DIAGNOSIS — H2510 Age-related nuclear cataract, unspecified eye: Secondary | ICD-10-CM

## 2012-07-28 DIAGNOSIS — H43819 Vitreous degeneration, unspecified eye: Secondary | ICD-10-CM

## 2012-07-28 DIAGNOSIS — H3530 Unspecified macular degeneration: Secondary | ICD-10-CM

## 2012-07-28 DIAGNOSIS — I10 Essential (primary) hypertension: Secondary | ICD-10-CM

## 2012-07-28 DIAGNOSIS — H35039 Hypertensive retinopathy, unspecified eye: Secondary | ICD-10-CM

## 2012-07-28 DIAGNOSIS — H353290 Exudative age-related macular degeneration, unspecified eye, stage unspecified: Secondary | ICD-10-CM

## 2012-07-29 ENCOUNTER — Other Ambulatory Visit (HOSPITAL_COMMUNITY): Payer: Self-pay | Admitting: Family Medicine

## 2012-07-29 DIAGNOSIS — Z139 Encounter for screening, unspecified: Secondary | ICD-10-CM

## 2012-08-04 ENCOUNTER — Ambulatory Visit (HOSPITAL_COMMUNITY)
Admission: RE | Admit: 2012-08-04 | Discharge: 2012-08-04 | Disposition: A | Discharge status: Home / Self Care | Payer: Medicare Other | Source: Ambulatory Visit | Attending: Family Medicine | Admitting: Family Medicine

## 2012-08-04 DIAGNOSIS — Z139 Encounter for screening, unspecified: Secondary | ICD-10-CM

## 2012-08-04 DIAGNOSIS — Z1231 Encounter for screening mammogram for malignant neoplasm of breast: Secondary | ICD-10-CM | POA: Insufficient documentation

## 2012-09-08 ENCOUNTER — Encounter (INDEPENDENT_AMBULATORY_CARE_PROVIDER_SITE_OTHER): Payer: Medicare Other | Admitting: Ophthalmology

## 2012-09-08 DIAGNOSIS — H2510 Age-related nuclear cataract, unspecified eye: Secondary | ICD-10-CM

## 2012-09-08 DIAGNOSIS — I10 Essential (primary) hypertension: Secondary | ICD-10-CM

## 2012-09-08 DIAGNOSIS — H353290 Exudative age-related macular degeneration, unspecified eye, stage unspecified: Secondary | ICD-10-CM

## 2012-09-08 DIAGNOSIS — H3530 Unspecified macular degeneration: Secondary | ICD-10-CM

## 2012-09-08 DIAGNOSIS — H35039 Hypertensive retinopathy, unspecified eye: Secondary | ICD-10-CM

## 2012-09-08 DIAGNOSIS — H43819 Vitreous degeneration, unspecified eye: Secondary | ICD-10-CM

## 2012-10-27 ENCOUNTER — Encounter (INDEPENDENT_AMBULATORY_CARE_PROVIDER_SITE_OTHER): Payer: Self-pay | Admitting: Ophthalmology

## 2012-10-27 DIAGNOSIS — H35039 Hypertensive retinopathy, unspecified eye: Secondary | ICD-10-CM

## 2012-10-27 DIAGNOSIS — H43819 Vitreous degeneration, unspecified eye: Secondary | ICD-10-CM

## 2012-10-27 DIAGNOSIS — H2510 Age-related nuclear cataract, unspecified eye: Secondary | ICD-10-CM

## 2012-10-27 DIAGNOSIS — I10 Essential (primary) hypertension: Secondary | ICD-10-CM

## 2012-10-27 DIAGNOSIS — H353290 Exudative age-related macular degeneration, unspecified eye, stage unspecified: Secondary | ICD-10-CM

## 2012-10-27 DIAGNOSIS — H3530 Unspecified macular degeneration: Secondary | ICD-10-CM

## 2012-12-08 ENCOUNTER — Encounter (INDEPENDENT_AMBULATORY_CARE_PROVIDER_SITE_OTHER): Payer: Medicare Other | Admitting: Ophthalmology

## 2012-12-10 ENCOUNTER — Encounter (INDEPENDENT_AMBULATORY_CARE_PROVIDER_SITE_OTHER): Payer: Medicare Other | Admitting: Ophthalmology

## 2012-12-10 DIAGNOSIS — H35039 Hypertensive retinopathy, unspecified eye: Secondary | ICD-10-CM

## 2012-12-10 DIAGNOSIS — I10 Essential (primary) hypertension: Secondary | ICD-10-CM

## 2012-12-10 DIAGNOSIS — H353290 Exudative age-related macular degeneration, unspecified eye, stage unspecified: Secondary | ICD-10-CM

## 2012-12-10 DIAGNOSIS — H3530 Unspecified macular degeneration: Secondary | ICD-10-CM

## 2013-01-29 ENCOUNTER — Other Ambulatory Visit: Payer: Self-pay

## 2013-01-29 DIAGNOSIS — I10 Essential (primary) hypertension: Secondary | ICD-10-CM

## 2013-01-29 MED ORDER — LISINOPRIL 20 MG PO TABS
20.0000 mg | ORAL_TABLET | Freq: Every day | ORAL | Status: DC
Start: 2013-01-29 — End: 2013-05-20

## 2013-02-16 ENCOUNTER — Emergency Department (HOSPITAL_COMMUNITY): Payer: Medicare Other

## 2013-02-16 ENCOUNTER — Inpatient Hospital Stay (HOSPITAL_COMMUNITY)
Admission: EM | Admit: 2013-02-16 | Discharge: 2013-02-25 | DRG: 291 | Disposition: A | Discharge status: Home / Self Care | Payer: Medicare Other | Attending: Internal Medicine | Admitting: Internal Medicine

## 2013-02-16 ENCOUNTER — Encounter (HOSPITAL_COMMUNITY): Payer: Self-pay | Admitting: Emergency Medicine

## 2013-02-16 DIAGNOSIS — Z8249 Family history of ischemic heart disease and other diseases of the circulatory system: Secondary | ICD-10-CM

## 2013-02-16 DIAGNOSIS — J9600 Acute respiratory failure, unspecified whether with hypoxia or hypercapnia: Secondary | ICD-10-CM | POA: Diagnosis not present

## 2013-02-16 DIAGNOSIS — Z79899 Other long term (current) drug therapy: Secondary | ICD-10-CM

## 2013-02-16 DIAGNOSIS — I10 Essential (primary) hypertension: Secondary | ICD-10-CM | POA: Diagnosis present

## 2013-02-16 DIAGNOSIS — I509 Heart failure, unspecified: Secondary | ICD-10-CM | POA: Diagnosis present

## 2013-02-16 DIAGNOSIS — K5900 Constipation, unspecified: Secondary | ICD-10-CM | POA: Diagnosis not present

## 2013-02-16 DIAGNOSIS — R059 Cough, unspecified: Secondary | ICD-10-CM

## 2013-02-16 DIAGNOSIS — J189 Pneumonia, unspecified organism: Secondary | ICD-10-CM

## 2013-02-16 DIAGNOSIS — J9809 Other diseases of bronchus, not elsewhere classified: Secondary | ICD-10-CM | POA: Diagnosis present

## 2013-02-16 DIAGNOSIS — E785 Hyperlipidemia, unspecified: Secondary | ICD-10-CM | POA: Diagnosis present

## 2013-02-16 DIAGNOSIS — J40 Bronchitis, not specified as acute or chronic: Secondary | ICD-10-CM

## 2013-02-16 DIAGNOSIS — I5033 Acute on chronic diastolic (congestive) heart failure: Principal | ICD-10-CM | POA: Diagnosis present

## 2013-02-16 DIAGNOSIS — J219 Acute bronchiolitis, unspecified: Secondary | ICD-10-CM

## 2013-02-16 DIAGNOSIS — R05 Cough: Secondary | ICD-10-CM

## 2013-02-16 DIAGNOSIS — E039 Hypothyroidism, unspecified: Secondary | ICD-10-CM | POA: Diagnosis present

## 2013-02-16 DIAGNOSIS — J449 Chronic obstructive pulmonary disease, unspecified: Secondary | ICD-10-CM | POA: Diagnosis present

## 2013-02-16 DIAGNOSIS — K644 Residual hemorrhoidal skin tags: Secondary | ICD-10-CM

## 2013-02-16 DIAGNOSIS — E119 Type 2 diabetes mellitus without complications: Secondary | ICD-10-CM | POA: Diagnosis present

## 2013-02-16 DIAGNOSIS — J4489 Other specified chronic obstructive pulmonary disease: Secondary | ICD-10-CM | POA: Diagnosis present

## 2013-02-16 DIAGNOSIS — J668 Airway disease due to other specific organic dusts: Secondary | ICD-10-CM | POA: Diagnosis present

## 2013-02-16 DIAGNOSIS — I5031 Acute diastolic (congestive) heart failure: Secondary | ICD-10-CM | POA: Diagnosis present

## 2013-02-16 DIAGNOSIS — R0602 Shortness of breath: Secondary | ICD-10-CM

## 2013-02-16 DIAGNOSIS — C539 Malignant neoplasm of cervix uteri, unspecified: Secondary | ICD-10-CM

## 2013-02-16 DIAGNOSIS — J441 Chronic obstructive pulmonary disease with (acute) exacerbation: Secondary | ICD-10-CM | POA: Diagnosis present

## 2013-02-16 DIAGNOSIS — K625 Hemorrhage of anus and rectum: Secondary | ICD-10-CM

## 2013-02-16 DIAGNOSIS — G5600 Carpal tunnel syndrome, unspecified upper limb: Secondary | ICD-10-CM

## 2013-02-16 DIAGNOSIS — R0902 Hypoxemia: Secondary | ICD-10-CM | POA: Diagnosis present

## 2013-02-16 DIAGNOSIS — K219 Gastro-esophageal reflux disease without esophagitis: Secondary | ICD-10-CM | POA: Diagnosis present

## 2013-02-16 DIAGNOSIS — R0789 Other chest pain: Secondary | ICD-10-CM

## 2013-02-16 DIAGNOSIS — R195 Other fecal abnormalities: Secondary | ICD-10-CM

## 2013-02-16 HISTORY — DX: Malignant (primary) neoplasm, unspecified: C80.1

## 2013-02-16 HISTORY — DX: Gastro-esophageal reflux disease without esophagitis: K21.9

## 2013-02-16 LAB — POCT I-STAT, CHEM 8
BUN: 15 mg/dL (ref 6–23)
Calcium, Ion: 1.22 mmol/L (ref 1.13–1.30)
Chloride: 98 mEq/L (ref 96–112)
Creatinine, Ser: 1.1 mg/dL (ref 0.50–1.10)
Glucose, Bld: 188 mg/dL — ABNORMAL HIGH (ref 70–99)
HCT: 40 % (ref 36.0–46.0)
Potassium: 4.6 mEq/L (ref 3.5–5.1)
TCO2: 29 mmol/L (ref 0–100)

## 2013-02-16 LAB — CBC
Hemoglobin: 12.3 g/dL (ref 12.0–15.0)
MCV: 94.3 fL (ref 78.0–100.0)
Platelets: 170 10*3/uL (ref 150–400)
RBC: 4.03 MIL/uL (ref 3.87–5.11)
WBC: 14.1 10*3/uL — ABNORMAL HIGH (ref 4.0–10.5)

## 2013-02-16 NOTE — ED Notes (Signed)
PER EMS: pt from home, room air O2 65% upon EMS arrival, given 1 breathing txt and O2 increased 99%. Pt removed from txt and O2 decreased down to 80%. Pt given another breathing txt and O2 remained at 99%. Denies CP. CBG-191. RR-20, BP-131/78, HR-85 NSR, PVCs. EMS reports ronchi bilaterally.

## 2013-02-16 NOTE — ED Notes (Signed)
Place pt on 5 liters of oxygen (nasal cannula) per Respiratory Therapist

## 2013-02-17 ENCOUNTER — Encounter (HOSPITAL_COMMUNITY): Payer: Self-pay | Admitting: Radiology

## 2013-02-17 ENCOUNTER — Emergency Department (HOSPITAL_COMMUNITY): Payer: Medicare Other

## 2013-02-17 DIAGNOSIS — R0602 Shortness of breath: Secondary | ICD-10-CM

## 2013-02-17 DIAGNOSIS — E119 Type 2 diabetes mellitus without complications: Secondary | ICD-10-CM

## 2013-02-17 DIAGNOSIS — J9809 Other diseases of bronchus, not elsewhere classified: Secondary | ICD-10-CM | POA: Diagnosis present

## 2013-02-17 DIAGNOSIS — J398 Other specified diseases of upper respiratory tract: Secondary | ICD-10-CM

## 2013-02-17 DIAGNOSIS — R0902 Hypoxemia: Secondary | ICD-10-CM

## 2013-02-17 LAB — GLUCOSE, CAPILLARY: Glucose-Capillary: 132 mg/dL — ABNORMAL HIGH (ref 70–99)

## 2013-02-17 LAB — PRO B NATRIURETIC PEPTIDE: Pro B Natriuretic peptide (BNP): 5205 pg/mL — ABNORMAL HIGH (ref 0–450)

## 2013-02-17 LAB — HEMOGLOBIN A1C: Hgb A1c MFr Bld: 6.3 % — ABNORMAL HIGH (ref <5.7)

## 2013-02-17 MED ORDER — IPRATROPIUM BROMIDE 0.02 % IN SOLN
0.5000 mg | Freq: Once | RESPIRATORY_TRACT | Status: AC
  Administered 2013-02-17: 0.5 mg via RESPIRATORY_TRACT
  Filled 2013-02-17: qty 2.5

## 2013-02-17 MED ORDER — ALBUTEROL SULFATE (5 MG/ML) 0.5% IN NEBU
2.5000 mg | INHALATION_SOLUTION | Freq: Four times a day (QID) | RESPIRATORY_TRACT | Status: DC
  Administered 2013-02-17 – 2013-02-18 (×3): 2.5 mg via RESPIRATORY_TRACT
  Filled 2013-02-17 (×3): qty 0.5

## 2013-02-17 MED ORDER — ENOXAPARIN SODIUM 40 MG/0.4ML ~~LOC~~ SOLN
40.0000 mg | SUBCUTANEOUS | Status: DC
  Administered 2013-02-17 – 2013-02-24 (×8): 40 mg via SUBCUTANEOUS
  Filled 2013-02-17 (×9): qty 0.4

## 2013-02-17 MED ORDER — LEVOTHYROXINE SODIUM 50 MCG PO TABS
50.0000 ug | ORAL_TABLET | Freq: Every day | ORAL | Status: DC
  Administered 2013-02-17 – 2013-02-25 (×9): 50 ug via ORAL
  Filled 2013-02-17 (×11): qty 1

## 2013-02-17 MED ORDER — INSULIN ASPART 100 UNIT/ML ~~LOC~~ SOLN
0.0000 [IU] | Freq: Three times a day (TID) | SUBCUTANEOUS | Status: DC
  Administered 2013-02-17: 3 [IU] via SUBCUTANEOUS
  Administered 2013-02-17: 2 [IU] via SUBCUTANEOUS
  Administered 2013-02-18: 8 [IU] via SUBCUTANEOUS
  Administered 2013-02-18 – 2013-02-20 (×6): 2 [IU] via SUBCUTANEOUS
  Administered 2013-02-21: 5 [IU] via SUBCUTANEOUS
  Administered 2013-02-21: 3 [IU] via SUBCUTANEOUS
  Administered 2013-02-22: 5 [IU] via SUBCUTANEOUS
  Administered 2013-02-22 (×2): 3 [IU] via SUBCUTANEOUS
  Administered 2013-02-23: 2 [IU] via SUBCUTANEOUS
  Administered 2013-02-23 – 2013-02-24 (×2): 3 [IU] via SUBCUTANEOUS
  Administered 2013-02-24: 2 [IU] via SUBCUTANEOUS

## 2013-02-17 MED ORDER — CALCIUM POLYCARBOPHIL 625 MG PO TABS
1250.0000 mg | ORAL_TABLET | Freq: Every day | ORAL | Status: DC
  Administered 2013-02-17 – 2013-02-25 (×9): 1250 mg via ORAL
  Filled 2013-02-17 (×9): qty 2

## 2013-02-17 MED ORDER — METHYLPREDNISOLONE SODIUM SUCC 125 MG IJ SOLR
60.0000 mg | Freq: Three times a day (TID) | INTRAMUSCULAR | Status: DC
  Administered 2013-02-17 – 2013-02-18 (×4): 60 mg via INTRAVENOUS
  Filled 2013-02-17 (×6): qty 0.96

## 2013-02-17 MED ORDER — ACETAMINOPHEN 650 MG RE SUPP: 650.0000 mg | Freq: Four times a day (QID) | RECTAL | Status: DC | PRN

## 2013-02-17 MED ORDER — ONDANSETRON HCL 4 MG/2ML IJ SOLN: 4.0000 mg | Freq: Four times a day (QID) | INTRAMUSCULAR | Status: DC | PRN

## 2013-02-17 MED ORDER — LISINOPRIL 20 MG PO TABS
20.0000 mg | ORAL_TABLET | Freq: Every day | ORAL | Status: DC
  Administered 2013-02-17 – 2013-02-21 (×5): 20 mg via ORAL
  Filled 2013-02-17 (×5): qty 1

## 2013-02-17 MED ORDER — PANTOPRAZOLE SODIUM 40 MG PO TBEC
40.0000 mg | DELAYED_RELEASE_TABLET | Freq: Every day | ORAL | Status: DC
  Administered 2013-02-17 – 2013-02-25 (×9): 40 mg via ORAL
  Filled 2013-02-17 (×9): qty 1

## 2013-02-17 MED ORDER — ONDANSETRON HCL 4 MG PO TABS: 4.0000 mg | ORAL_TABLET | Freq: Four times a day (QID) | ORAL | Status: DC | PRN

## 2013-02-17 MED ORDER — GUAIFENESIN ER 600 MG PO TB12
600.0000 mg | ORAL_TABLET | Freq: Two times a day (BID) | ORAL | Status: DC
  Administered 2013-02-17 – 2013-02-20 (×8): 600 mg via ORAL
  Filled 2013-02-17 (×10): qty 1

## 2013-02-17 MED ORDER — ACETAMINOPHEN 325 MG PO TABS
650.0000 mg | ORAL_TABLET | Freq: Four times a day (QID) | ORAL | Status: DC | PRN
  Administered 2013-02-23 – 2013-02-24 (×2): 650 mg via ORAL
  Filled 2013-02-17 (×3): qty 2

## 2013-02-17 MED ORDER — ALBUTEROL SULFATE (5 MG/ML) 0.5% IN NEBU
2.5000 mg | INHALATION_SOLUTION | Freq: Once | RESPIRATORY_TRACT | Status: AC
  Administered 2013-02-17: 2.5 mg via RESPIRATORY_TRACT
  Filled 2013-02-17: qty 0.5

## 2013-02-17 MED ORDER — POTASSIUM CHLORIDE CRYS ER 10 MEQ PO TBCR
10.0000 meq | EXTENDED_RELEASE_TABLET | Freq: Every day | ORAL | Status: DC
  Administered 2013-02-17 – 2013-02-25 (×9): 10 meq via ORAL
  Filled 2013-02-17 (×11): qty 1

## 2013-02-17 MED ORDER — IPRATROPIUM BROMIDE 0.02 % IN SOLN
0.5000 mg | Freq: Four times a day (QID) | RESPIRATORY_TRACT | Status: DC
  Administered 2013-02-17 – 2013-02-18 (×3): 0.5 mg via RESPIRATORY_TRACT
  Filled 2013-02-17 (×4): qty 2.5

## 2013-02-17 MED ORDER — AMLODIPINE BESYLATE 5 MG PO TABS
5.0000 mg | ORAL_TABLET | Freq: Every day | ORAL | Status: DC
  Filled 2013-02-17: qty 1

## 2013-02-17 MED ORDER — FUROSEMIDE 40 MG PO TABS
40.0000 mg | ORAL_TABLET | Freq: Every day | ORAL | Status: DC
  Administered 2013-02-17: 40 mg via ORAL
  Filled 2013-02-17 (×2): qty 1

## 2013-02-17 MED ORDER — ALBUTEROL SULFATE (5 MG/ML) 0.5% IN NEBU
2.5000 mg | INHALATION_SOLUTION | RESPIRATORY_TRACT | Status: DC | PRN
  Administered 2013-02-19 – 2013-02-21 (×5): 2.5 mg via RESPIRATORY_TRACT
  Filled 2013-02-17 (×5): qty 0.5

## 2013-02-17 MED ORDER — ALBUTEROL (5 MG/ML) CONTINUOUS INHALATION SOLN
10.0000 mg/h | INHALATION_SOLUTION | Freq: Once | RESPIRATORY_TRACT | Status: AC
  Administered 2013-02-17: 10 mg/h via RESPIRATORY_TRACT
  Filled 2013-02-17: qty 20

## 2013-02-17 MED ORDER — IOHEXOL 350 MG/ML SOLN
100.0000 mL | Freq: Once | INTRAVENOUS | Status: AC | PRN
  Administered 2013-02-17: 100 mL via INTRAVENOUS

## 2013-02-17 NOTE — Progress Notes (Signed)
Pt admitted to the unit at 08:00. Pt mental status is alert and oriented x 4. Pt oriented to room, staff, and call bell. Skin is intact. Full assessment charted in [facility]. Call bell within reach. Visitor guidelines reviewed w/ pt and/or family.  Peri Maris, MBA, BS, RN

## 2013-02-17 NOTE — Progress Notes (Signed)
Patient is still unable to perform the peak flow at this time. RT will continue to assist as needed.

## 2013-02-17 NOTE — Progress Notes (Signed)
Utilization review completed.  

## 2013-02-17 NOTE — ED Notes (Signed)
Pt. Is noted to be unable to ambulate.  Pt. Is noted as a falls risk.

## 2013-02-17 NOTE — H&P (Addendum)
[facility] History and Physical  Sandra Cuevas ZOX:[id number] DOB: 1928/10/22 DOA: 02/16/2013  Referring physician: Rhunette Croft PCP: Colette Ribas, MD   Chief Complaint:  Progressive SOB for several weeks  HPI:  79 year old female with history of chronic dyspnea, atypical chest pains with negative stress test in 2012, hx of  asthma, hypertension and diabetes mellitus who presented to the ED with progress shortness of breath for several weeks. She reports mainly dyspnea on exertion. She reports off and on leg swellings as well. Denies dyspnea at rest, orthopnea or PND. Does report cough with whitish phlegm. Denies wheezing. Denies fever, chills, headache, blurred vision, nausea, vomiting, chest pain, palpitations, abdominal pain, bowel or urinary symptoms. Denies any sick contacts or recent travel.  Course in the ED Patient was noted to be short of breath with O2 sat in 70s on room and. Chest x-ray showed emphysematous changes. Blood pressure was also low at 87/69 mmHg. CT angiogram of the chest was done which was negative for PE. Will infiltrate was noted. Patient given nebulizer and placed on facemask and admitted to telemetry.  Review of Systems:  Constitutional: Denies fever, chills, diaphoresis, appetite change, reports fatigue HEENT: Denies photophobia, eye pain, redness, hearing loss, ear pain, congestion, sore throat, rhinorrhea, sneezing, mouth sores, trouble swallowing, neck pain, neck stiffness and tinnitus.   Respiratory: SOB, DOE, cough, denies chest tightness,  and wheezing.   Cardiovascular: Denies chest pain, palpitations, leg swelling.  Gastrointestinal: Denies nausea, vomiting, abdominal pain, diarrhea, constipation, blood in stool and abdominal distention.  Genitourinary: Denies dysuria, urgency, frequency, hematuria, flank pain and difficulty urinating.  Endocrine: Denies polyuria, polydipsia. Musculoskeletal: Denies myalgias, back pain, joint swelling,  arthralgias and gait problem.  Neurological: Denies dizziness, seizures, syncope, weakness, light-headedness, numbness and headaches.     Past Medical History  Diagnosis Date  . Hyperlipidemia   . Hypertension   . Atypical chest pain   . Hypothyroidism   . Diabetes mellitus   . History of cardiovascular stress test 06/01/2010    EF 71% - no evidence of ischemia, normal left ventricular systolic function  . Shortness of breath     on exertion  . History of hysterectomy 1965   Past Surgical History  Procedure Laterality Date  . Cardiac catheterization  2001    EF=65%  . Hemorrhoid surgery     Social History:  reports that she has never smoked. She has never used smokeless tobacco. She reports that she does not drink alcohol or use illicit drugs.  No Known Allergies  Family History  Problem Relation Age of Onset  . Heart disease Father     heart problems  . Kidney failure Mother   . Diabetes Mother   . Cancer Brother   . Diabetes Brother     Prior to Admission medications   Medication Sig Start Date End Date Taking? Authorizing Provider  albuterol (PROVENTIL HFA;VENTOLIN HFA) 108 (90 BASE) MCG/ACT inhaler Inhale 2 puffs into the lungs every 6 (six) hours as needed for wheezing. 08/22/10 02/16/13 Yes Vesta Mixer, MD  amLODipine (NORVASC) 5 MG tablet Take 1 tablet (5 mg total) by mouth daily. 03/25/11  Yes Vesta Mixer, MD  atorvastatin (LIPITOR) 10 MG tablet Take 10 mg by mouth Daily.  07/13/10  Yes Historical Provider, MD  furosemide (LASIX) 40 MG tablet Take 40 mg by mouth daily.   Yes Historical Provider, MD  levothyroxine (SYNTHROID, LEVOTHROID) 50 MCG tablet Take 50 mcg by mouth daily.  Yes Historical Provider, MD  lisinopril (PRINIVIL,ZESTRIL) 20 MG tablet Take 1 tablet (20 mg total) by mouth daily. 01/29/13 01/29/14 Yes Vesta Mixer, MD  perindopril (ACEON) 4 MG tablet Take 4 mg by mouth daily.   Yes Historical Provider, MD  polycarbophil (FIBERCON) 625 MG  tablet Take 1,250 mg by mouth daily.    Yes Historical Provider, MD  potassium chloride (MICRO-K) 10 MEQ CR capsule Take 10 mEq by mouth daily.  08/15/10  Yes Historical Provider, MD  ONE TOUCH ULTRA TEST test strip as directed. 05/14/10   Historical Provider, MD    Physical Exam:  Filed Vitals:   02/17/13 0718 02/17/13 0720 02/17/13 0750 02/17/13 0753  BP:  125/31 129/38 140/67  Pulse:  71 74 71  Temp:    98.3 F (36.8 C)  TempSrc:    Oral  Resp:  20 16 18   SpO2: 96% 95% 94% 95%    Constitutional: Vital signs reviewed. Fairly female lying in bed in no acute distress HEENT: Pallor, muscle to mucosa, no cervical lymphadenopathy, no JVD Cardiovascular: RRR, S1 normal, S2 normal, no MRG,  Pulmonary/Chest: Bilateral equal air entry, scattered rhonchi, no crackles Abdominal: Soft. Non-tender, non-distended, bowel sounds are normal, no masses, organomegaly, or guarding present.  Ext: Warm , Trace edema Hematology: no cervical  adenopathy.  Neurological: A&O x3, nonfocal    Labs on Admission:  Basic Metabolic Panel:  Recent Labs Lab 02/16/13 2326  NA 138  K 4.6  CL 98  GLUCOSE 188*  BUN 15  CREATININE 1.10   Liver Function Tests: No results found for this basename: AST, ALT, ALKPHOS, BILITOT, PROT, ALBUMIN,  in the last 168 hours No results found for this basename: LIPASE, AMYLASE,  in the last 168 hours No results found for this basename: AMMONIA,  in the last 168 hours CBC:  Recent Labs Lab 02/16/13 2310 02/16/13 2326  WBC 14.1*  --   HGB 12.3 13.6  HCT 38.0 40.0  MCV 94.3  --   PLT 170  --    Cardiac Enzymes: No results found for this basename: CKTOTAL, CKMB, CKMBINDEX, TROPONINI,  in the last 168 hours BNP: No components found with this basename: POCBNP,  CBG: No results found for this basename: GLUCAP,  in the last 168 hours  Radiological Exams on Admission: Ct Angio Chest Pe W/cm &/or Wo Cm  02/17/2013   CLINICAL DATA:  Shortness of breath.  EXAM: CT  ANGIOGRAPHY CHEST WITH CONTRAST  TECHNIQUE: Multidetector CT imaging of the chest was performed using the standard protocol during bolus administration of intravenous contrast. Multiplanar CT image reconstructions including MIPs were obtained to evaluate the vascular anatomy.  CONTRAST:  OMNIPAQUE IOHEXOL 350 MG/ML SOLN  COMPARISON:  Chest radiograph performed 02/16/2013, and CTA of the chest performed 09/20/2009  FINDINGS: There is no evidence of pulmonary embolus.  Minimal bilateral atelectasis is noted. There is no evidence of significant focal consolidation, pleural effusion or pneumothorax. No masses are identified; no abnormal focal contrast enhancement is seen.  Scattered mildly prominent peribronchial nodes are seen, measuring 0.8 cm on the right and 1.3 cm on the left. No definite mediastinal lymphadenopathy is seen. Diffuse calcification is noted along the thoracic aorta. Scattered calcification is seen along the proximal great vessels. No pericardial effusion is seen. No axillary lymphadenopathy is seen. The visualized portions of the thyroid gland are unremarkable in appearance.  The visualized portions of the liver and spleen are unremarkable.  No acute osseous abnormalities are seen.  A right shoulder arthroplasty is unremarkable in appearance.  Review of the MIP images confirms the above findings.  IMPRESSION: 1. No evidence of pulmonary embolus. 2. Minimal bilateral atelectasis; lungs otherwise clear. 3. Scattered mildly prominent peribronchial nodes, measuring up to 1.3 cm; these are nonspecific in appearance.   Electronically Signed   By: Roanna Raider M.D.   On: 02/17/2013 04:31   Dg Chest Portable 1 View  02/17/2013   CLINICAL DATA:  Shortness of breath, hypertension. History of asthma and bronchitis and pneumonia.  EXAM: PORTABLE CHEST - 1 VIEW  COMPARISON:  Chest radiograph February 19, 2010  FINDINGS: Cardiac silhouette appears moderately enlarged, similar to prior examination.  Moderately calcified aortic knob. Similar mild pulmonary hyper expansion, chronic interstitial changes with biapical pleural parenchymal scarring. Improved aeration of the lingula. 6 mm nodular density projects in left lung base. No pneumothorax.  Status post right shoulder arthroplasty. Multiple EKG lines overlie the patient and may obscure subtle underlying pathology.  IMPRESSION: Moderate cardiomegaly and emphysematous changes. 6 mm nodular density an left lung base could reflect pulmonary on vessel en face or true parenchymal nodule. If clinically indicated, this could be further characterized on CT, if not performed recommend close attention on followup imaging.   Electronically Signed   By: Awilda Metro   On: 02/17/2013 00:23    EKG:   Assessment/Plan Principal Problem:   Bronchospastic airway disease Admit to med -surg Possibly related to underlying COPD which seems new. IV solumedrol, scheduled and prn nebs.  Monitor o2 sat. Transitioned to 3L via [place] Check pro BNP and  2D echo  Continue lasix   Active Problems:   DM Place on SSI. Hold metformin for now. Check A1C    Hyperlipidemia continue home meds  Hypertension BP low in ER. Hold Amlodipine and perindopril    Hypothyroidism Continue synthroid. Check TSH     DVT prophylaxis: Subcutaneous Lovenox Diet: Cardiac    Code Status: FULL Family Communication: none Disposition Plan: home once stable  Faven Watterson [facility] Pager [phone]  If 7PM-7AM, please contact night-coverage [email or web address] Password [facility] 02/17/2013, 8:34 AM    Total time spent: 70 minutes

## 2013-02-17 NOTE — ED Provider Notes (Signed)
CSN: [id number]     Arrival date & time 02/16/13  2255 History   First MD Initiated Contact with Patient 02/17/13 0024     Chief Complaint  Patient presents with  . Shortness of Breath   (Consider location/radiation/quality/duration/timing/severity/associated sxs/prior Treatment) HPI Comments: 79 year old female with a history of atypical chest pains, HTN, HL, DM comes in with cc of DIB.   She had a negative stress Myoview study in March of 2012. She has a history of asthma and uses albuterol on an as-needed basis. She continues to have shortness of breath with exertion. He also has a history of hypertension.   Patient is a 79 y.o. female presenting with shortness of breath. The history is provided by the patient and medical records.  Shortness of Breath Shortness of Breath    Past Medical History  Diagnosis Date  . Hyperlipidemia   . Hypertension   . Atypical chest pain   . Hypothyroidism   . Diabetes mellitus   . History of cardiovascular stress test 06/01/2010    EF 71% - no evidence of ischemia, normal left ventricular systolic function  . Shortness of breath     on exertion  . History of hysterectomy 1965   Past Surgical History  Procedure Laterality Date  . Cardiac catheterization  2001    EF=65%  . Hemorrhoid surgery     Family History  Problem Relation Age of Onset  . Heart disease Father     heart problems  . Kidney failure Mother   . Diabetes Mother   . Cancer Brother   . Diabetes Brother    History  Substance Use Topics  . Smoking status: Never Smoker   . Smokeless tobacco: Never Used  . Alcohol Use: No   OB History   Grav Para Term Preterm Abortions TAB SAB Ect Mult Living                 Review of Systems  Respiratory: Positive for shortness of breath.     Allergies  Review of patient's allergies indicates no known allergies.  Home Medications   Current Outpatient Rx  Name  Route  Sig  Dispense  Refill  . EXPIRED: albuterol (PROVENTIL  HFA;VENTOLIN HFA) 108 (90 BASE) MCG/ACT inhaler   Inhalation   Inhale 2 puffs into the lungs every 6 (six) hours as needed for wheezing.   1 Inhaler   12   . amLODipine (NORVASC) 5 MG tablet   Oral   Take 1 tablet (5 mg total) by mouth daily.   90 tablet   1   . atorvastatin (LIPITOR) 10 MG tablet   Oral   Take 10 mg by mouth Daily.          . furosemide (LASIX) 40 MG tablet   Oral   Take 40 mg by mouth daily.         Marland Kitchen levothyroxine (SYNTHROID, LEVOTHROID) 50 MCG tablet   Oral   Take 50 mcg by mouth daily.           Marland Kitchen lisinopril (PRINIVIL,ZESTRIL) 20 MG tablet   Oral   Take 1 tablet (20 mg total) by mouth daily.   90 tablet   0     .Marland KitchenPatient needs to contact office to schedule  App ...   . perindopril (ACEON) 4 MG tablet   Oral   Take 4 mg by mouth daily.         . polycarbophil (FIBERCON) 625 MG tablet  Oral   Take 1,250 mg by mouth daily.          . potassium chloride (MICRO-K) 10 MEQ CR capsule   Oral   Take 10 mEq by mouth daily.          . ONE TOUCH ULTRA TEST test strip      as directed.          BP 89/46  Pulse 41  Temp(Src) 98.6 F (37 C) (Oral)  Resp 17  SpO2 95% Physical Exam  ED Course  Procedures (including critical care time) Labs Review Labs Reviewed  CBC - Abnormal; Notable for the following:    WBC 14.1 (*)    All other components within normal limits  POCT I-STAT, CHEM 8 - Abnormal; Notable for the following:    Glucose, Bld 188 (*)    All other components within normal limits  POCT I-STAT TROPONIN I   Imaging Review No results found.  EKG Interpretation   None       MDM  No diagnosis found.   PLEASE IGNORE THIS NOTE. THE STUDENT IS NOT UNDER MY SUPERVISION, AND I CANNOT COSIGN OR EDIT HER NOTES. MY NOTE IS IN THE PATIENT CHART ALREADY.  Derwood Kaplan, MD 02/24/13 [phone]

## 2013-02-17 NOTE — ED Notes (Signed)
Patient transported to CT 

## 2013-02-17 NOTE — Progress Notes (Signed)
Pt not able to move much air and unable to do much with peak flow at this time.

## 2013-02-18 ENCOUNTER — Encounter (INDEPENDENT_AMBULATORY_CARE_PROVIDER_SITE_OTHER): Payer: Medicare Other | Admitting: Ophthalmology

## 2013-02-18 DIAGNOSIS — I5031 Acute diastolic (congestive) heart failure: Secondary | ICD-10-CM | POA: Diagnosis present

## 2013-02-18 DIAGNOSIS — I369 Nonrheumatic tricuspid valve disorder, unspecified: Secondary | ICD-10-CM

## 2013-02-18 DIAGNOSIS — E039 Hypothyroidism, unspecified: Secondary | ICD-10-CM

## 2013-02-18 LAB — GLUCOSE, CAPILLARY
Glucose-Capillary: 145 mg/dL — ABNORMAL HIGH (ref 70–99)
Glucose-Capillary: 168 mg/dL — ABNORMAL HIGH (ref 70–99)
Glucose-Capillary: 257 mg/dL — ABNORMAL HIGH (ref 70–99)
Glucose-Capillary: 157 mg/dL — ABNORMAL HIGH (ref 70–99)

## 2013-02-18 MED ORDER — FUROSEMIDE 10 MG/ML IJ SOLN
20.0000 mg | Freq: Two times a day (BID) | INTRAMUSCULAR | Status: DC
  Administered 2013-02-18 – 2013-02-19 (×3): 20 mg via INTRAVENOUS
  Filled 2013-02-18 (×4): qty 2

## 2013-02-18 MED ORDER — METHYLPREDNISOLONE SODIUM SUCC 40 MG IJ SOLR
40.0000 mg | Freq: Two times a day (BID) | INTRAMUSCULAR | Status: DC
  Administered 2013-02-19 – 2013-02-20 (×3): 40 mg via INTRAVENOUS
  Filled 2013-02-18 (×5): qty 1

## 2013-02-18 NOTE — Progress Notes (Signed)
Echo Lab  2D Echocardiogram completed.  Sandra Cuevas L [name], RDCS 02/18/2013 4:13 PM

## 2013-02-18 NOTE — Progress Notes (Signed)
[facility] PROGRESS NOTE  Sandra Cuevas WUJ:[id number] DOB: 02/11/1929 DOA: 02/16/2013 PCP: Colette Ribas, MD  Assessment/Plan: Principal Problem:   Acute diastolic heart failure: Elevated BNP and with mostly clear lungs, this is likely acute diastolic heart failure. Have started IV Lasix and monitoring output.  Active Problems:   DM continue sliding scale and insulin   HYPERLIPIDEMIA continue lipids   HYPERTENSION continue BP meds   Hypothyroidism continue Synthroid   Hypoxia   Bronchospastic airway disease: May be some mild bronchospasm in relation to heart failure. Continue nebs and oxygen. Coming down steroids   Code Status: Full code  Family Communication: Left messages family Disposition Plan: Home in next 2 days was fully diuresed   Consultants:  Non-  Procedures:  None  Antibiotics:  None  HPI/Subjective: Patient feeling much better. Some shortness of breath and cough with clear sputum, but improved from day prior.  Objective: Filed Vitals:   02/18/13 0450  BP: 117/69  Pulse: 73  Temp: 97.8 F (36.6 C)  Resp: 18    Intake/Output Summary (Last 24 hours) at 02/18/13 0747 Last data filed at 02/18/13 0452  Gross per 24 hour  Intake    960 ml  Output      7 ml  Net    953 ml   Filed Weights   02/17/13 0800 02/17/13 2044  Weight: 93.804 kg (206 lb 12.8 oz) 93.804 kg (206 lb 12.8 oz)    Exam:   General:  Alert and oriented x3, no acute distress  Cardiovascular: Regular rate and rhythm, S1-S2  Respiratory: Decreased breath sounds throughout, more so at the bases  Abdomen: Soft, nontender, nondistended, positive bowel sounds  Musculoskeletal: No clubbing or cyanosis or edema   Data Reviewed: Basic Metabolic Panel:  Recent Labs Lab 02/16/13 2326  NA 138  K 4.6  CL 98  GLUCOSE 188*  BUN 15  CREATININE 1.10   CBC:  Recent Labs Lab 02/16/13 2310 02/16/13 2326  WBC 14.1*  --   HGB 12.3 13.6  HCT 38.0 40.0  MCV 94.3   --   PLT 170  --    BNP (last 3 results)  Recent Labs  02/17/13 0940  PROBNP 5205.0*   CBG:  Recent Labs Lab 02/17/13 1146 02/17/13 1621 02/17/13 2047 02/18/13 0728  GLUCAP 132* 151* 185* 145*    No results found for this or any previous visit (from the past 240 hour(s)).   Studies: Ct Angio Chest Pe W/cm &/or Wo Cm  02/17/2013   IMPRESSION: 1. No evidence of pulmonary embolus. 2. Minimal bilateral atelectasis; lungs otherwise clear. 3. Scattered mildly prominent peribronchial nodes, measuring up to 1.3 cm; these are nonspecific in appearance.   Electronically Signed   By: Roanna Raider M.D.   On: 02/17/2013 04:31   Dg Chest Portable 1 View  02/17/2013    IMPRESSION: Moderate cardiomegaly and emphysematous changes. 6 mm nodular density an left lung base could reflect pulmonary on vessel en face or true parenchymal nodule. If clinically indicated, this could be further characterized on CT, if not performed recommend close attention on followup imaging.   Electronically Signed   By: Awilda Metro   On: 02/17/2013 00:23    Scheduled Meds: . albuterol  2.5 mg Nebulization Q6H  . enoxaparin (LOVENOX) injection  40 mg Subcutaneous Q24H  . furosemide  20 mg Intravenous Q12H  . guaiFENesin  600 mg Oral BID  . insulin aspart  0-15 Units Subcutaneous TID WC  .  ipratropium  0.5 mg Nebulization Q6H  . levothyroxine  50 mcg Oral Q breakfast  . lisinopril  20 mg Oral Daily  . methylPREDNISolone (SOLU-MEDROL) injection  60 mg Intravenous Q8H  . pantoprazole  40 mg Oral Daily  . polycarbophil  1,250 mg Oral Daily  . potassium chloride  10 mEq Oral Daily   Continuous Infusions:   Principal Problem:   Acute diastolic heart failure Active Problems:   DM   HYPERLIPIDEMIA   HYPERTENSION   Hypothyroidism   Hypoxia   Bronchospastic airway disease    Time spent: 25 minutes    Hollice Espy  [facility] Pager [phone] If 7PM-7AM, please contact night-coverage  at [email or web address], password [facility] 02/18/2013, 7:47 AM  LOS: 2 days

## 2013-02-19 LAB — BASIC METABOLIC PANEL
BUN: 34 mg/dL — ABNORMAL HIGH (ref 6–23)
Calcium: 9.4 mg/dL (ref 8.4–10.5)
GFR calc Af Amer: 53 mL/min — ABNORMAL LOW (ref >90)
GFR calc non Af Amer: 46 mL/min — ABNORMAL LOW (ref >90)
Potassium: 4.7 mEq/L (ref 3.5–5.1)
Sodium: 137 mEq/L (ref 135–145)

## 2013-02-19 LAB — PRO B NATRIURETIC PEPTIDE: Pro B Natriuretic peptide (BNP): 713.5 pg/mL — ABNORMAL HIGH (ref 0–450)

## 2013-02-19 LAB — GLUCOSE, CAPILLARY: Glucose-Capillary: 125 mg/dL — ABNORMAL HIGH (ref 70–99)

## 2013-02-19 MED ORDER — LEVOFLOXACIN 750 MG PO TABS
750.0000 mg | ORAL_TABLET | ORAL | Status: DC
  Administered 2013-02-19 – 2013-02-23 (×3): 750 mg via ORAL
  Filled 2013-02-19 (×4): qty 1

## 2013-02-19 MED ORDER — LEVOFLOXACIN 500 MG PO TABS: 500.0000 mg | ORAL_TABLET | Freq: Every day | ORAL | Status: DC

## 2013-02-19 MED ORDER — FUROSEMIDE 40 MG PO TABS
40.0000 mg | ORAL_TABLET | Freq: Two times a day (BID) | ORAL | Status: DC
  Administered 2013-02-19 – 2013-02-21 (×4): 40 mg via ORAL
  Filled 2013-02-19 (×6): qty 1

## 2013-02-19 NOTE — Clinical Documentation Improvement (Signed)
Possible Clinical Conditions?  Acute Respiratory Failure Acute on Chronic Respiratory Failure Chronic Respiratory Failure Other Condition Cannot Clinically Determine   Supporting Information: Risk Factors:"hx of asthma, hypertension and diabetes mellitus"  Signs & Symptoms:( as per notes) "SOB, pt from home, room air O2 65% upon EMS arrival, given 1 breathing txt and O2 increased 99%.Pt removed from txt and O2 decreased down to 80%.Pt given another breathing txt and O2 remained at 99%." (As per H&P) "Respiratory: SOB, DOE, cough, denies chest tightness, and wheezing", "ED Patient was noted to be short of breath with O2 sat in 70s on room air", "Bronchospastic airway disease Admit to med -surg Possibly related to underlying COPD which seems new", "Hypoxia"  Diagnostics: CXR 02-19-13 IMPRESSION:  Moderate cardiomegaly and emphysematous changes.6 mm nodular density an left lung base could reflect pulmonary on vessel en face or true parenchymal nodule.If clinically indicated, this could be further characterized on CT, if not performed recommend close attention on followup imaging  Respiratory Treatment: Venturi Mask on Admission.  Pt is now on O2 3l/m [place]  Treatment:"IV solumedrol, scheduled and prn nebs.Monitor O2 sat.Transitioned to 3L via [place] Check pro BNP and 2D echo Continue lasix"  Thank You, Nevin Bloodgood, RN, BSN, CCDS Clinical Documentation Specialist:  [phone]  [facility]- Health Information Management [phone]

## 2013-02-19 NOTE — Progress Notes (Addendum)
[facility] PROGRESS NOTE  Sandra Cuevas VZD:[id number] DOB: 1928-10-13 DOA: 02/16/2013 PCP: Colette Ribas, MD  Assessment/Plan: Principal Problem:   Acute diastolic heart failure: Responded well to IV Lasix with 1.5 L out in BNP down to 700. BUN starting to increase so we'll change to by mouth Lasix  Active Problems:   DM continue sliding scale and insulin: CBG stable. A1c noted good control at 6.3   HYPERLIPIDEMIA continue lipids   HYPERTENSION continue BP meds   Hypothyroidism continue Synthroid   Hypoxia   Bronchospastic airway disease: May be some mild bronchospasm in relation to heart failure. Continue nebs and oxygen. Titrating on steroids. Given some persistent cough and overall congestion, will add by mouth Levaquin. Discussed with patient about choking during eating which she says really happens   Code Status: Full code  Family Communication: Spoke with patient's husband and brother and sister-in-law at the bedside Disposition Plan: Home in next 2 days was fully diuresed   Consultants:  Non-  Procedures:  None  Antibiotics:  Levaquin by mouth day 1  HPI/Subjective: Continues to have some cough and congestion, but overall feeling better  Objective: Filed Vitals:   02/19/13 1623  BP: 132/48  Pulse: 63  Temp: 98.2 F (36.8 C)  Resp: 18    Intake/Output Summary (Last 24 hours) at 02/19/13 1629 Last data filed at 02/19/13 1400  Gross per 24 hour  Intake    800 ml  Output   2700 ml  Net  -1900 ml   Filed Weights   02/17/13 0800 02/17/13 2044 02/18/13 2042  Weight: 93.804 kg (206 lb 12.8 oz) 93.804 kg (206 lb 12.8 oz) 93.804 kg (206 lb 12.8 oz)    Exam:   General:  Alert and oriented x3, no acute distress  Cardiovascular: Regular rate and rhythm, S1-S2  Respiratory: Decreased breath sounds throughout, more so at the bases with few end expiratory wheeze  Abdomen: Soft, nontender, nondistended, positive bowel sounds  Musculoskeletal:  No clubbing or cyanosis or edema   Data Reviewed: Basic Metabolic Panel:  Recent Labs Lab 02/16/13 2326 02/19/13 1423  NA 138 137  K 4.6 4.7  CL 98 94*  CO2  --  37*  GLUCOSE 188* 163*  BUN 15 34*  CREATININE 1.10 1.09  CALCIUM  --  9.4   CBC:  Recent Labs Lab 02/16/13 2310 02/16/13 2326  WBC 14.1*  --   HGB 12.3 13.6  HCT 38.0 40.0  MCV 94.3  --   PLT 170  --    BNP (last 3 results)  Recent Labs  02/17/13 0940 02/19/13 1423  PROBNP 5205.0* 713.5*   CBG:  Recent Labs Lab 02/18/13 1115 02/18/13 1632 02/18/13 2046 02/19/13 0722 02/19/13 1125  GLUCAP 168* 257* 157* 125* 139*    No results found for this or any previous visit (from the past 240 hour(s)).   Studies: Ct Angio Chest Pe W/cm &/or Wo Cm  02/17/2013   IMPRESSION: 1. No evidence of pulmonary embolus. 2. Minimal bilateral atelectasis; lungs otherwise clear. 3. Scattered mildly prominent peribronchial nodes, measuring up to 1.3 cm; these are nonspecific in appearance.   Electronically Signed   By: Roanna Raider M.D.   On: 02/17/2013 04:31   Dg Chest Portable 1 View  02/17/2013    IMPRESSION: Moderate cardiomegaly and emphysematous changes. 6 mm nodular density an left lung base could reflect pulmonary on vessel en face or true parenchymal nodule. If clinically indicated, this could be further characterized  on CT, if not performed recommend close attention on followup imaging.   Electronically Signed   By: Awilda Metro   On: 02/17/2013 00:23    Scheduled Meds: . enoxaparin (LOVENOX) injection  40 mg Subcutaneous Q24H  . furosemide  40 mg Oral BID  . guaiFENesin  600 mg Oral BID  . insulin aspart  0-15 Units Subcutaneous TID WC  . levofloxacin  500 mg Oral Daily  . levothyroxine  50 mcg Oral Q breakfast  . lisinopril  20 mg Oral Daily  . methylPREDNISolone (SOLU-MEDROL) injection  40 mg Intravenous Q12H  . pantoprazole  40 mg Oral Daily  . polycarbophil  1,250 mg Oral Daily  . potassium  chloride  10 mEq Oral Daily   Continuous Infusions:   Principal Problem:   Acute diastolic heart failure Active Problems:   DM   HYPERLIPIDEMIA   HYPERTENSION   Hypothyroidism   Hypoxia   Bronchospastic airway disease    Time spent: 25 minutes    Sandra Cuevas  [facility] Pager [phone] If 7PM-7AM, please contact night-coverage at [email or web address], password [facility] 02/19/2013, 4:29 PM  LOS: 3 days

## 2013-02-19 NOTE — Progress Notes (Signed)
   CARE MANAGEMENT NOTE 02/19/2013  Patient:  Sandra Cuevas, Sandra Cuevas   Account Number:  [id number]  Date Initiated:  02/17/2013  Documentation initiated by:  Darlyne Russian  Subjective/Objective Assessment:   Patient admitted with SOB, POX  65 % when EMS arrived     Action/Plan:   Progression of care and discharge planning. NCM to follow for home health and DME needs. She lives at home with her spouse.    Anticipated DC Date:  02/20/2013   Anticipated DC Plan:  HOME/SELF CARE      DC Planning Services  CM consult      Choice offered to / List presented to:             Status of service:  In process, will continue to follow Medicare Important Message given?   (If response is "NO", the following Medicare IM given date fields will be blank) Date Medicare IM given:   Date Additional Medicare IM given:    Discharge Disposition:    Per UR Regulation:  Reviewed for med. necessity/level of care/duration of stay  If discussed at Long Length of Stay Meetings, dates discussed:    Comments:  02/19/2013  1100 Darlyne Russian RN,CCM  [phone] Met with patient to discuss discharge planning and home health heart failure program. Explained the focus of the program to reinforce the heart failure regimen. She follows a low salt diet, monitors her weight, uses a cane, rests when SOB, elevates her legs,has a f/u with her cardiologist and declines the home health program. NCM encouraged her to continue with monitoring her weight daily, leg swelling, SOB and to notify MD of any changes.

## 2013-02-20 DIAGNOSIS — R05 Cough: Secondary | ICD-10-CM

## 2013-02-20 LAB — BASIC METABOLIC PANEL
BUN: 33 mg/dL — ABNORMAL HIGH (ref 6–23)
CO2: 39 mEq/L — ABNORMAL HIGH (ref 19–32)
Calcium: 8.9 mg/dL (ref 8.4–10.5)
Chloride: 91 mEq/L — ABNORMAL LOW (ref 96–112)
Creatinine, Ser: 1.07 mg/dL (ref 0.50–1.10)
GFR calc Af Amer: 54 mL/min — ABNORMAL LOW (ref >90)
GFR calc Af Amer: 59 mL/min — ABNORMAL LOW (ref >90)
GFR calc non Af Amer: 47 mL/min — ABNORMAL LOW (ref >90)
GFR calc non Af Amer: 51 mL/min — ABNORMAL LOW (ref >90)
Glucose, Bld: 136 mg/dL — ABNORMAL HIGH (ref 70–99)
Glucose, Bld: 117 mg/dL — ABNORMAL HIGH (ref 70–99)
Potassium: 4.3 mEq/L (ref 3.5–5.1)
Potassium: 4 mEq/L (ref 3.5–5.1)
Sodium: 137 mEq/L (ref 135–145)
Sodium: 142 mEq/L (ref 135–145)

## 2013-02-20 LAB — GLUCOSE, CAPILLARY
Glucose-Capillary: 130 mg/dL — ABNORMAL HIGH (ref 70–99)
Glucose-Capillary: 141 mg/dL — ABNORMAL HIGH (ref 70–99)
Glucose-Capillary: 154 mg/dL — ABNORMAL HIGH (ref 70–99)

## 2013-02-20 MED ORDER — PREDNISONE 50 MG PO TABS
50.0000 mg | ORAL_TABLET | Freq: Two times a day (BID) | ORAL | Status: DC
  Administered 2013-02-20 – 2013-02-21 (×3): 50 mg via ORAL
  Filled 2013-02-20 (×4): qty 1

## 2013-02-20 NOTE — Progress Notes (Signed)
SATURATION QUALIFICATIONS: (This note is used to comply with regulatory documentation for home oxygen)  Patient Saturations on Room Air at Rest = 88%  Patient Saturations on [employer] while Ambulating = 80%   Peri Maris, MBA, [employer], RN

## 2013-02-20 NOTE — Progress Notes (Signed)
[facility] PROGRESS NOTE  KEMESHA MOSEY ZHY:[id number] DOB: 1929/02/25 DOA: 02/16/2013 PCP: Colette Ribas, MD  Assessment/Plan: Principal Problem:   Acute diastolic heart failure: Responded well to IV Lasix with 1.5 L out in BNP down to 700. Changed to po lasix  Active Problems:   DM continue sliding scale and insulin: CBG stable. A1c noted good control at 6.3    HYPERLIPIDEMIA continue lipids    HYPERTENSION continue BP meds    Hypothyroidism continue Synthroid    Hypoxia    Bronchospastic airway disease: May be some mild bronchospasm in relation to heart failure. Continue nebs and oxygen. Change steroids to po. Given some persistent cough and overall congestion, added mouth Levaquin.   We'll get out of bed. Check ambulatory pulse ox on room air, using inspirometer  Code Status: Full code  Family Communication: Spoke with patient's husband and brother and sister-in-law at the bedside yesterday Disposition Plan: Home in next 2 days when O2 says off supplemental O2   Consultants:  None  Procedures:  None  Antibiotics:  Levaquin by mouth day 2  HPI/Subjective: Continues to have some cough and congestion, but overall feeling better  Objective: Filed Vitals:   02/20/13 0700  BP: 154/55  Pulse: 69  Temp: 97.9 F (36.6 C)  Resp: 18    Intake/Output Summary (Last 24 hours) at 02/20/13 0815 Last data filed at 02/19/13 1835  Gross per 24 hour  Intake    760 ml  Output   1800 ml  Net  -1040 ml   Filed Weights   02/17/13 0800 02/17/13 2044 02/18/13 2042  Weight: 93.804 kg (206 lb 12.8 oz) 93.804 kg (206 lb 12.8 oz) 93.804 kg (206 lb 12.8 oz)    Exam:   General:  Alert and oriented x3, no acute distress  Cardiovascular: Regular rate and rhythm, S1-S2  Respiratory: Decreased breath sounds throughout, bilat end exp wheeze  Abdomen: Soft, nontender, nondistended, positive bowel sounds  Musculoskeletal: No clubbing or cyanosis or edema    Data Reviewed: Basic Metabolic Panel:  Recent Labs Lab 02/16/13 2326 02/19/13 0234 02/19/13 1423 02/20/13 0541  NA 138 137 137 142  K 4.6 4.3 4.7 4.0  CL 98 91* 94* 95*  CO2  --  39* 37* 38*  GLUCOSE 188* 136* 163* 117*  BUN 15 34* 34* 33*  CREATININE 1.10 1.07 1.09 1.00  CALCIUM  --  8.9 9.4 8.9   CBC:  Recent Labs Lab 02/16/13 2310 02/16/13 2326  WBC 14.1*  --   HGB 12.3 13.6  HCT 38.0 40.0  MCV 94.3  --   PLT 170  --    BNP (last 3 results)  Recent Labs  02/17/13 0940 02/19/13 1423  PROBNP 5205.0* 713.5*   CBG:  Recent Labs Lab 02/18/13 1632 02/18/13 2046 02/19/13 0722 02/19/13 1125 02/19/13 1619  GLUCAP 257* 157* 125* 139* 130*    No results found for this or any previous visit (from the past 240 hour(s)).   Studies: Ct Angio Chest Pe W/cm &/or Wo Cm  02/17/2013   IMPRESSION: 1. No evidence of pulmonary embolus. 2. Minimal bilateral atelectasis; lungs otherwise clear. 3. Scattered mildly prominent peribronchial nodes, measuring up to 1.3 cm; these are nonspecific in appearance.   Electronically Signed   By: Roanna Raider M.D.   On: 02/17/2013 04:31   Dg Chest Portable 1 View  02/17/2013    IMPRESSION: Moderate cardiomegaly and emphysematous changes. 6 mm nodular density an left lung base could  reflect pulmonary on vessel en face or true parenchymal nodule. If clinically indicated, this could be further characterized on CT, if not performed recommend close attention on followup imaging.   Electronically Signed   By: Awilda Metro   On: 02/17/2013 00:23    Scheduled Meds: . enoxaparin (LOVENOX) injection  40 mg Subcutaneous Q24H  . furosemide  40 mg Oral BID  . guaiFENesin  600 mg Oral BID  . insulin aspart  0-15 Units Subcutaneous TID WC  . levofloxacin  750 mg Oral Q48H  . levothyroxine  50 mcg Oral Q breakfast  . lisinopril  20 mg Oral Daily  . methylPREDNISolone (SOLU-MEDROL) injection  40 mg Intravenous Q12H  . pantoprazole  40 mg  Oral Daily  . polycarbophil  1,250 mg Oral Daily  . potassium chloride  10 mEq Oral Daily   Continuous Infusions:   Principal Problem:   Acute diastolic heart failure Active Problems:   DM   HYPERLIPIDEMIA   HYPERTENSION   Hypothyroidism   Hypoxia   Bronchospastic airway disease    Time spent: 25 minutes    Hollice Espy  [facility] Pager [phone] If 7PM-7AM, please contact night-coverage at [email or web address], password [facility] 02/20/2013, 8:15 AM  LOS: 4 days

## 2013-02-21 ENCOUNTER — Inpatient Hospital Stay (HOSPITAL_COMMUNITY): Payer: Medicare Other

## 2013-02-21 DIAGNOSIS — I5031 Acute diastolic (congestive) heart failure: Secondary | ICD-10-CM

## 2013-02-21 LAB — BLOOD GAS, ARTERIAL
Acid-Base Excess: 17 mmol/L — ABNORMAL HIGH (ref 0.0–2.0)
Bicarbonate: 43.2 mEq/L — ABNORMAL HIGH (ref 20.0–24.0)
Bicarbonate: 43.1 mEq/L — ABNORMAL HIGH (ref 20.0–24.0)
Drawn by: 10552
O2 Content: 3 L/min
O2 Saturation: 92.9 %
Patient temperature: 98.6
Patient temperature: 98.6
TCO2: 45.4 mmol/L (ref 0–100)
pCO2 arterial: 74.9 mmHg (ref 35.0–45.0)
pCO2 arterial: 75.3 mmHg (ref 35.0–45.0)
pH, Arterial: 7.379 (ref 7.350–7.450)
pH, Arterial: 7.375 (ref 7.350–7.450)
pO2, Arterial: 154 mmHg — ABNORMAL HIGH (ref 80.0–100.0)

## 2013-02-21 LAB — GLUCOSE, CAPILLARY
Glucose-Capillary: 116 mg/dL — ABNORMAL HIGH (ref 70–99)
Glucose-Capillary: 250 mg/dL — ABNORMAL HIGH (ref 70–99)
Glucose-Capillary: 160 mg/dL — ABNORMAL HIGH (ref 70–99)

## 2013-02-21 LAB — BASIC METABOLIC PANEL
BUN: 36 mg/dL — ABNORMAL HIGH (ref 6–23)
CO2: 43 mEq/L (ref 19–32)
Calcium: 9.4 mg/dL (ref 8.4–10.5)
Chloride: 91 mEq/L — ABNORMAL LOW (ref 96–112)
GFR calc non Af Amer: 45 mL/min — ABNORMAL LOW (ref >90)
Glucose, Bld: 149 mg/dL — ABNORMAL HIGH (ref 70–99)
Potassium: 3.8 mEq/L (ref 3.5–5.1)
Sodium: 139 mEq/L (ref 135–145)

## 2013-02-21 MED ORDER — POLYETHYLENE GLYCOL 3350 17 G PO PACK
17.0000 g | PACK | Freq: Every day | ORAL | Status: DC
  Administered 2013-02-21 – 2013-02-25 (×5): 17 g via ORAL
  Filled 2013-02-21 (×5): qty 1

## 2013-02-21 MED ORDER — DOCUSATE SODIUM 100 MG PO CAPS
100.0000 mg | ORAL_CAPSULE | Freq: Two times a day (BID) | ORAL | Status: DC
  Administered 2013-02-21 – 2013-02-25 (×9): 100 mg via ORAL
  Filled 2013-02-21 (×10): qty 1

## 2013-02-21 MED ORDER — ALBUTEROL SULFATE (5 MG/ML) 0.5% IN NEBU
2.5000 mg | INHALATION_SOLUTION | RESPIRATORY_TRACT | Status: DC
  Administered 2013-02-21 – 2013-02-22 (×4): 2.5 mg via RESPIRATORY_TRACT
  Filled 2013-02-21 (×4): qty 0.5

## 2013-02-21 MED ORDER — IPRATROPIUM BROMIDE 0.02 % IN SOLN
0.5000 mg | RESPIRATORY_TRACT | Status: DC
  Administered 2013-02-21 – 2013-02-22 (×4): 0.5 mg via RESPIRATORY_TRACT
  Filled 2013-02-21 (×4): qty 2.5

## 2013-02-21 MED ORDER — ALBUTEROL SULFATE (5 MG/ML) 0.5% IN NEBU
2.5000 mg | INHALATION_SOLUTION | Freq: Four times a day (QID) | RESPIRATORY_TRACT | Status: DC
Start: 2013-02-21 — End: 2013-02-21

## 2013-02-21 MED ORDER — METHYLPREDNISOLONE SODIUM SUCC 125 MG IJ SOLR
60.0000 mg | Freq: Three times a day (TID) | INTRAMUSCULAR | Status: DC
  Administered 2013-02-22: 60 mg via INTRAVENOUS
  Filled 2013-02-21 (×5): qty 0.96

## 2013-02-21 MED ORDER — GUAIFENESIN 100 MG/5ML PO SYRP
200.0000 mg | ORAL_SOLUTION | ORAL | Status: DC | PRN
  Administered 2013-02-21: 200 mg via ORAL
  Filled 2013-02-21 (×3): qty 10

## 2013-02-21 NOTE — Consult Note (Signed)
PULMONARY  / CRITICAL CARE MEDICINE  Name: Sandra Cuevas MRN: [id number] DOB: 1929/02/07    ADMISSION DATE:  02/16/2013 CONSULTATION DATE:  02/21/13  REFERRING MD :  [facility] PRIMARY SERVICE: [facility]   CHIEF COMPLAINT:  Consult for Hpercapnia  BRIEF PATIENT DESCRIPTION: 79 yo admitted 11/11 for acute diastolic failure by [facility].  Pulmonary consult on 11/16 for hypercarbia .    ANTIBIOTICS: 11/11 Levaquin >>  HISTORY OF PRESENT ILLNESS:   79 year old female never smoker  with history of chronic dyspnea, atypical chest pains with negative stress test in 2012, hx of asthma, hypertension and diabetes mellitus who presented to the ED with progress shortness of breath for several weeks on 11/11 . She was admitted for acute diastolic heart failure . Tx with aggressive diuresis . 11/16 Pulmonary consult for rising PCO2   Worked in [employer] x 13y and [employer] x 27 y.  Echo showed gr 2 diastolic dysfunction C/o congestion with clear phlegm & wheezing  PAST MEDICAL HISTORY :  Past Medical History  Diagnosis Date  . Hyperlipidemia   . Hypertension   . Atypical chest pain   . Hypothyroidism   . Diabetes mellitus   . History of cardiovascular stress test 06/01/2010    EF 71% - no evidence of ischemia, normal left ventricular systolic function  . Shortness of breath     on exertion  . History of hysterectomy 1965  . GERD (gastroesophageal reflux disease)   . 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 Cardiac catheterization  2001    EF=65%  . Hemorrhoid surgery    . Abdominal hysterectomy     Prior to Admission medications   Medication Sig Start Date End Date Taking? Authorizing Provider  albuterol (PROVENTIL HFA;VENTOLIN HFA) 108 (90 BASE) MCG/ACT inhaler Inhale 2 puffs into the lungs every 6 (six) hours as needed for wheezing. 08/22/10 02/16/13 Yes Vesta Mixer, MD  amLODipine (NORVASC) 5 MG tablet Take 1 tablet (5 mg total) by  mouth daily. 03/25/11  Yes Vesta Mixer, MD  atorvastatin (LIPITOR) 10 MG tablet Take 10 mg by mouth Daily.  07/13/10  Yes Historical Provider, MD  furosemide (LASIX) 40 MG tablet Take 40 mg by mouth daily.   Yes Historical Provider, MD  levothyroxine (SYNTHROID, LEVOTHROID) 50 MCG tablet Take 50 mcg by mouth daily.     Yes Historical Provider, MD  lisinopril (PRINIVIL,ZESTRIL) 20 MG tablet Take 1 tablet (20 mg total) by mouth daily. 01/29/13 01/29/14 Yes Vesta Mixer, MD  perindopril (ACEON) 4 MG tablet Take 4 mg by mouth daily.   Yes Historical Provider, MD  polycarbophil (FIBERCON) 625 MG tablet Take 1,250 mg by mouth daily.    Yes Historical Provider, MD  potassium chloride (MICRO-K) 10 MEQ CR capsule Take 10 mEq by mouth daily.  08/15/10  Yes Historical Provider, MD  ONE TOUCH ULTRA TEST test strip as directed. 05/14/10   Historical Provider, MD   No Known Allergies  FAMILY HISTORY:  Family History  Problem Relation Age of Onset  . Heart disease Father     heart problems  . Kidney failure Mother   . Diabetes Mother   . Cancer Brother   . Diabetes Brother    SOCIAL HISTORY:  reports that she has never smoked. She has never used smokeless tobacco. She reports that she does not drink alcohol or use illicit drugs.  REVIEW  OF SYSTEMS: as in HPI Constitutional: negative for anorexia, fevers and sweats  Eyes: negative for irritation, redness and visual disturbance  Ears, nose, mouth, throat, and face: negative for earaches, epistaxis, nasal congestion and sore throat  Respiratory: negative forhemoptysis Cardiovascular: negative for chest pain, dyspnea, lower extremity edema, orthopnea, palpitations and syncope  Gastrointestinal: negative for abdominal pain, constipation, diarrhea, melena, nausea and vomiting  Genitourinary:negative for dysuria, frequency and hematuria  Hematologic/lymphatic: negative for bleeding, easy bruising and lymphadenopathy  Musculoskeletal:negative for  arthralgias, muscle weakness and stiff joints  Neurological: negative for coordination problems, gait problems, headaches and weakness  Endocrine: negative for diabetic symptoms including polydipsia, polyuria and weight loss   SUBJECTIVE:   VITAL SIGNS: Temp:  [97.7 F (36.5 C)-98.5 F (36.9 C)] 98.5 F (36.9 C) (11/16 1552) Pulse Rate:  [60-88] 78 (11/16 1552) Resp:  [16-19] 19 (11/16 1552) BP: (98-163)/(45-75) 140/62 mmHg (11/16 1552) SpO2:  [92 %-99 %] 99 % (11/16 1552) Weight:  [93.804 kg (206 lb 12.8 oz)] 93.804 kg (206 lb 12.8 oz) (11/15 2055) HEMODYNAMICS:   VENTILATOR SETTINGS:   INTAKE / OUTPUT: Intake/Output     11/15 0701 - 11/16 0700 11/16 0701 - 11/17 0700   P.O. 720 600   Total Intake(mL/kg) 720 (7.7) 600 (6.4)   Urine (mL/kg/hr) 1425 (0.6) 250 (0.2)   Total Output 1425 250   Net -705 +350        Urine Occurrence     Stool Occurrence       PHYSICAL EXAMINATION: Gen. Pleasant, well-nourished, in no distress, normal affect ENT - no lesions, no post nasal drip Neck: No JVD, no thyromegaly, no carotid bruits Lungs: no use of accessory muscles, no dullness to percussion, Bl scattered  rhonchi  Cardiovascular: Rhythm regular, heart sounds  normal, no murmurs, no peripheral edema Abdomen: soft and non-tender, no hepatosplenomegaly, BS normal. Musculoskeletal: No deformities, no cyanosis or clubbing Neuro:  alert, non focal Skin:  Warm, no lesions/ rash     LABS:  CBC  Recent Labs Lab 02/16/13 2310 02/16/13 2326  WBC 14.1*  --   HGB 12.3 13.6  HCT 38.0 40.0  PLT 170  --    Coag's No results found for this basename: APTT, INR,  in the last 168 hours BMET  Recent Labs Lab 02/19/13 1423 02/20/13 0541 02/21/13 0443  NA 137 142 139  K 4.7 4.0 3.8  CL 94* 95* 91*  CO2 37* 38* 43*  BUN 34* 33* 36*  CREATININE 1.09 1.00 1.10  GLUCOSE 163* 117* 149*   Electrolytes  Recent Labs Lab 02/19/13 1423 02/20/13 0541 02/21/13 0443  CALCIUM 9.4  8.9 9.4   Sepsis Markers No results found for this basename: LATICACIDVEN, PROCALCITON, O2SATVEN,  in the last 168 hours ABG  Recent Labs Lab 02/21/13 1052 02/21/13 1645  PHART 7.379 7.375  PCO2ART 74.9* 75.3*  PO2ART 67.1* 154.0*   Liver Enzymes No results found for this basename: AST, ALT, ALKPHOS, BILITOT, ALBUMIN,  in the last 168 hours Cardiac Enzymes  Recent Labs Lab 02/17/13 0940 02/19/13 1423  PROBNP 5205.0* 713.5*   Glucose  Recent Labs Lab 02/20/13 1205 02/20/13 1750 02/20/13 2100 02/21/13 0720 02/21/13 1102 02/21/13 1629  GLUCAP 141* 154* 130* 116* 157* 250*    Imaging Dg Chest 2 View  02/21/2013   CLINICAL DATA:  Cough, shortness of breath  EXAM: CHEST  2 VIEW  COMPARISON:  02/17/2013  FINDINGS: Stable cardiomegaly without CHF. No pneumonia. Bibasilar atelectasis persist. No enlarging effusion  or pneumothorax. Aortic atherosclerosis present. Prior right shoulder arthroplasty changes. Degenerative changes of the spine evident.  IMPRESSION: Cardiomegaly without CHF or pneumonia  Basilar atelectasis  Stable exam   Electronically Signed   By: Ruel Favors M.D.   On: 02/21/2013 13:45     CXR: Cardiomegaly without CHF or pneumonia . Basilar atelectasis   ASSESSMENT / PLAN:  PULMONARY A: Asthmatic Bronchitis in never smoker  Hypercarbia ? Etiology   P:   Albuterol/atrovent nebs q 4h IV solumedrol 60mg  iV q8  Continue on Levaquin  O2 for sat >90.    CARDIOVASCULAR A:  Acute Diastolic Dysfunction  P: Stop ACE , no BB Use ca channel blocker       INFECTIOUS A:  Asthmatic bronchitis  >afebrile  P:   Cont Levaquin   PARRETT,TAMMY NP- C  TODAY'S SUMMARY: Treat as asthmatic bronchitis with steroids/ bronchodilators - watch sugars. Technically not copd in this never smoker Gr 2 diastolic dysfunction , diuresis seems to have helped.  I have personally obtained a history, examined the patient, evaluated laboratory and imaging results,  formulated the assessment and plan and placed orders.   Oretha Milch  Pulmonary and Critical Care Medicine [facility] Pager: [phone]  02/21/2013, 6:15 PM

## 2013-02-21 NOTE — Progress Notes (Signed)
Tolerating Bipap well. RT by to recheck ABG. Will continue to monitor.

## 2013-02-21 NOTE — Progress Notes (Signed)
RT at bedside spoke with Dr. Rito Ehrlich about Bipap.Placed on Bipap. Family still at bedside. Will continue to monitor.

## 2013-02-21 NOTE — Progress Notes (Signed)
Addendum: ABG done noting PCO2 of 74 with a PO2 of 61 on 3 L and a bicarbonate level of 42. We'll transfer patient to step down unit for BiPAP. Will ask pulmonary for assistance. Updated patient and her daughter.

## 2013-02-21 NOTE — Progress Notes (Signed)
SLP Cancellation Note  Patient Details Name: [name] MRN: [id number] DOB: 13-Nov-1928   Cancelled treatment: ST received order for BSE.  Attempt x1 but not completed as patient transferred to stepdown unit and currently on BiPAP. ST to f/u on 02/22/13. Moreen Fowler MS, CCC-SLP [phone] [facility] 02/21/2013, 2:59 PM

## 2013-02-21 NOTE — Progress Notes (Signed)
Call by Dr Virginia Rochester of [facility]  PAtient non smoker but passive smoker with hypercapnia. Still wheezing. Did some bipap  On camera   looks stable  Plan Increase neb freqwuency Back to IV steroids E-care to continue Pulse ox goal > 88% pulm consult to follow; non  Urgent anytime next 18h   Dr. Kalman Shan, M.D., [facility].C.P Pulmonary and Critical Care Medicine Staff Physician [facility] Pager: [phone], If no answer or between  15:00h - 7:00h: call [phone]  02/21/2013 5:16 PM

## 2013-02-21 NOTE — Progress Notes (Signed)
Pt arrived to 3S14-VSS, oriented to unit and routine, family at bedside, call bell within reach, RT notified of Bipap need and on way. Dr. Rito Ehrlich notified of patients arrival to the unit.  Will continue to monitor.

## 2013-02-21 NOTE — Progress Notes (Addendum)
[facility] PROGRESS NOTE  MAZZY SANTARELLI NWG:[id number] DOB: 04/24/1928 DOA: 02/16/2013 PCP: Colette Ribas, MD  Assessment/Plan: Principal Problem:   Acute diastolic heart failure: Down to dry weight.  Noted bicarb level going up, sign of early alkalosis from overdiuresis.  Have stopped lasix.  Note:Med rec lists lasix 40mg  daily  For patient's home dose, but she tells me this medicine was stopped this Summer.    Active Problems:   DM continue sliding scale and insulin: CBG stable. A1c noted good control at 6.3    HYPERLIPIDEMIA continue lipids    HYPERTENSION continue BP meds    Hypothyroidism continue Synthroid  Constipation: Bowels not moved x 1 week.  Will start miralax & colace.    Hypoxia    Bronchospastic airway disease/emphysema:  Acute exacerbation plusMay be some mild bronchospasm in relation to heart failure. Continue when necessary nebs and oxygen. Change steroids to po. Given some persistent cough and overall congestion, added mouth Levaquin. Added scheduled meds. On room air at rest and with ambulation, oxygen saturations fall below 88%  We'll get out of bed. Check ambulatory pulse ox on room air, using inspirometer. Minimal inspiratory reserve with inspirometer at 750 mL. We'll check repeat chest x-ray and ABG. May need pulmonary assistance. We'll also check swallow eval  Code Status: Full code  Family Communication:   Disposition Plan: Home in next few days when O2 says off supplemental O2   Consultants:  None  Procedures:  None  Antibiotics:  Levaquin by mouth day 3  HPI/Subjective: Patient continues to feel somewhat better. Limited inspiratory effort using inspirometer. Some shortness of breath and coughing. Has not moved bowels in a number of days  Objective: Filed Vitals:   02/21/13 0512  BP: 150/45  Pulse: 65  Temp: 97.8 F (36.6 C)  Resp: 18    Intake/Output Summary (Last 24 hours) at 02/21/13 0744 Last data filed at 02/21/13  0514  Gross per 24 hour  Intake    720 ml  Output   1425 ml  Net   -705 ml   Filed Weights   02/17/13 2044 02/18/13 2042 02/20/13 2055  Weight: 93.804 kg (206 lb 12.8 oz) 93.804 kg (206 lb 12.8 oz) 93.804 kg (206 lb 12.8 oz)    Exam:   General:  Alert and oriented x3, no acute distress  Cardiovascular: Regular rate and rhythm, S1-S2  Respiratory: Decreased breath sounds throughout, bilat end exp wheeze  Abdomen: Soft, nontender, nondistended, positive bowel sounds  Musculoskeletal: No clubbing or cyanosis or edema   Data Reviewed: Basic Metabolic Panel:  Recent Labs Lab 02/16/13 2326 02/19/13 0234 02/19/13 1423 02/20/13 0541 02/21/13 0443  NA 138 137 137 142 139  K 4.6 4.3 4.7 4.0 3.8  CL 98 91* 94* 95* 91*  CO2  --  39* 37* 38* 43*  GLUCOSE 188* 136* 163* 117* 149*  BUN 15 34* 34* 33* 36*  CREATININE 1.10 1.07 1.09 1.00 1.10  CALCIUM  --  8.9 9.4 8.9 9.4   CBC:  Recent Labs Lab 02/16/13 2310 02/16/13 2326  WBC 14.1*  --   HGB 12.3 13.6  HCT 38.0 40.0  MCV 94.3  --   PLT 170  --    BNP (last 3 results)  Recent Labs  02/17/13 0940 02/19/13 1423  PROBNP 5205.0* 713.5*   CBG:  Recent Labs Lab 02/20/13 0733 02/20/13 1205 02/20/13 1750 02/20/13 2100 02/21/13 0720  GLUCAP 130* 141* 154* 130* 116*    No results  found for this or any previous visit (from the past 240 hour(s)).   Studies: Ct Angio Chest Pe W/cm &/or Wo Cm  02/17/2013   IMPRESSION: 1. No evidence of pulmonary embolus. 2. Minimal bilateral atelectasis; lungs otherwise clear. 3. Scattered mildly prominent peribronchial nodes, measuring up to 1.3 cm; these are nonspecific in appearance.   Electronically Signed   By: Roanna Raider M.D.   On: 02/17/2013 04:31   Dg Chest Portable 1 View  02/17/2013    IMPRESSION: Moderate cardiomegaly and emphysematous changes. 6 mm nodular density an left lung base could reflect pulmonary on vessel en face or true parenchymal nodule. If clinically  indicated, this could be further characterized on CT, if not performed recommend close attention on followup imaging.   Electronically Signed   By: Awilda Metro   On: 02/17/2013 00:23    Scheduled Meds: . enoxaparin (LOVENOX) injection  40 mg Subcutaneous Q24H  . furosemide  40 mg Oral BID  . guaiFENesin  600 mg Oral BID  . insulin aspart  0-15 Units Subcutaneous TID WC  . levofloxacin  750 mg Oral Q48H  . levothyroxine  50 mcg Oral Q breakfast  . lisinopril  20 mg Oral Daily  . pantoprazole  40 mg Oral Daily  . polycarbophil  1,250 mg Oral Daily  . potassium chloride  10 mEq Oral Daily  . predniSONE  50 mg Oral BID WC   Continuous Infusions:   Principal Problem:   Acute diastolic heart failure Active Problems:   DM   HYPERLIPIDEMIA   HYPERTENSION   Hypothyroidism   Hypoxia   Bronchospastic airway disease    Time spent: 30 minutes    Hollice Espy  [facility] Pager [phone] If 7PM-7AM, please contact night-coverage at [email or web address], password [facility] 02/21/2013, 7:44 AM  LOS: 5 days

## 2013-02-21 NOTE — Progress Notes (Signed)
CRITICAL VALUE ALERT  Critical value received:  ABG, PCo2 = 74.9  Date of notification: 02-21-2013  Time of notification:  11:07  Critical value read back:yes  Nurse who received alert:  Meridee Score, RN   MD notified (1st page):  Chancy Milroy  Time MD responded:  11:08   Peri Maris, MBA, BS, RN

## 2013-02-21 NOTE — Progress Notes (Signed)
Pt. Transferring to 3S14.  Report called to [facility].  Peri Maris, MBA, BS, RN

## 2013-02-22 DIAGNOSIS — R0789 Other chest pain: Secondary | ICD-10-CM

## 2013-02-22 DIAGNOSIS — J218 Acute bronchiolitis due to other specified organisms: Secondary | ICD-10-CM

## 2013-02-22 LAB — GLUCOSE, CAPILLARY
Glucose-Capillary: 195 mg/dL — ABNORMAL HIGH (ref 70–99)
Glucose-Capillary: 225 mg/dL — ABNORMAL HIGH (ref 70–99)
Glucose-Capillary: 229 mg/dL — ABNORMAL HIGH (ref 70–99)

## 2013-02-22 LAB — BASIC METABOLIC PANEL
BUN: 34 mg/dL — ABNORMAL HIGH (ref 6–23)
CO2: 40 mEq/L (ref 19–32)
Calcium: 9.3 mg/dL (ref 8.4–10.5)
GFR calc Af Amer: 61 mL/min — ABNORMAL LOW (ref >90)
Glucose, Bld: 181 mg/dL — ABNORMAL HIGH (ref 70–99)
Potassium: 4.2 mEq/L (ref 3.5–5.1)
Sodium: 138 mEq/L (ref 135–145)

## 2013-02-22 MED ORDER — IPRATROPIUM BROMIDE 0.02 % IN SOLN: 0.5000 mg | RESPIRATORY_TRACT | Status: DC | PRN

## 2013-02-22 MED ORDER — INSULIN GLARGINE 100 UNIT/ML ~~LOC~~ SOLN
10.0000 [IU] | Freq: Every day | SUBCUTANEOUS | Status: DC
  Administered 2013-02-22 – 2013-02-24 (×3): 10 [IU] via SUBCUTANEOUS
  Filled 2013-02-22 (×4): qty 0.1

## 2013-02-22 MED ORDER — PREDNISONE 20 MG PO TABS
40.0000 mg | ORAL_TABLET | Freq: Every day | ORAL | Status: DC
  Administered 2013-02-23 – 2013-02-25 (×3): 40 mg via ORAL
  Filled 2013-02-22 (×6): qty 2

## 2013-02-22 MED ORDER — IPRATROPIUM BROMIDE 0.02 % IN SOLN
0.5000 mg | Freq: Four times a day (QID) | RESPIRATORY_TRACT | Status: DC
  Administered 2013-02-22 (×3): 0.5 mg via RESPIRATORY_TRACT
  Filled 2013-02-22 (×3): qty 2.5

## 2013-02-22 MED ORDER — METHYLPREDNISOLONE SODIUM SUCC 125 MG IJ SOLR
60.0000 mg | Freq: Once | INTRAMUSCULAR | Status: AC
  Administered 2013-02-22: 60 mg via INTRAVENOUS
  Filled 2013-02-22: qty 2
  Filled 2013-02-22: qty 0.96

## 2013-02-22 MED ORDER — ALBUTEROL SULFATE (5 MG/ML) 0.5% IN NEBU
2.5000 mg | INHALATION_SOLUTION | Freq: Four times a day (QID) | RESPIRATORY_TRACT | Status: DC
  Administered 2013-02-22 (×3): 2.5 mg via RESPIRATORY_TRACT
  Filled 2013-02-22 (×3): qty 0.5

## 2013-02-22 MED ORDER — ALBUTEROL SULFATE (5 MG/ML) 0.5% IN NEBU: 2.5000 mg | INHALATION_SOLUTION | RESPIRATORY_TRACT | Status: DC | PRN

## 2013-02-22 MED ORDER — BUDESONIDE-FORMOTEROL FUMARATE 160-4.5 MCG/ACT IN AERO
2.0000 | INHALATION_SPRAY | Freq: Two times a day (BID) | RESPIRATORY_TRACT | Status: DC
  Administered 2013-02-22 – 2013-02-25 (×6): 2 via RESPIRATORY_TRACT
  Filled 2013-02-22: qty 6

## 2013-02-22 MED ORDER — FUROSEMIDE 40 MG PO TABS
40.0000 mg | ORAL_TABLET | Freq: Every day | ORAL | Status: DC
  Administered 2013-02-22 – 2013-02-25 (×4): 40 mg via ORAL
  Filled 2013-02-22 (×4): qty 1

## 2013-02-22 NOTE — Progress Notes (Signed)
[facility] Progress Note [place] TEAM 1 - Stepdown/ICU TEAM   Sandra Cuevas MVH:[id number] DOB: 1928-07-24 DOA: 02/16/2013 PCP: Colette Ribas, MD  Admit HPI / Brief Narrative: 79 year old female with history of chronic dyspnea, atypical chest pains with negative stress test in 2012, hx of asthma, hypertension, and diabetes mellitus who presented to the ED with progressive shortness of breath for several weeks. She reported mainly dyspnea on exertion. She reported off and on leg swelling as well. Denied dyspnea at rest, orthopnea or PND.   Course in the ED: Patient was noted to be short of breath with O2 sat in 70s on room air. Chest x-ray showed emphysematous changes. Blood pressure was also low at 87/69 mmHg. CT angiogram of the chest was done which was negative for PE. Patient given nebulizer and placed on facemask and admitted to telemetry.   Assessment/Plan:  Bronchospastic airway disease / asthmatic bronchitis  / bisinosis much improved today - transition to oral steroids in AM   Hypoxia Due to above - much improved - wean O2 as able   Acute diastolic heart failure (grede 2 via TTE) If Is/Os are accurate, pt has actually diuresed very little since admit (only -622 net) - no evidence of signif volume overload on exam - wgt not checked in 48 hrs - orders strict Is/Os and daily wgts - resume home diuretic dose  DM CBG not at goal, on steroids - adjust tx and follow trend   HYPERLIPIDEMIA  Resume lipitor  HYPERTENSION No change in tx plan today  Hypothyroidism On home replacement dose  Code Status: FULL Family Communication: Spoke with patient and husband of 66 years at bedside Disposition Plan: Monitor in step down unit overnight with probable transfer to a medical bed in a.m.  Consultants: Pulmonary   Procedures: none  Antibiotics: Levaquin 11/11 >>  DVT prophylaxis: lovenox  HPI/Subjective: The patient reports that she feels much better today.   She denies shortness of breath wheeze cough headache chest pain fevers or chills.  Objective: Blood pressure 141/57, pulse 66, temperature 98.6 F (37 C), temperature source Oral, resp. rate 22, height 5\' 6"  (1.676 m), weight 93.804 kg (206 lb 12.8 oz), SpO2 92.00%.  Intake/Output Summary (Last 24 hours) at 02/22/13 1459 Last data filed at 02/21/13 2011  Gross per 24 hour  Intake      0 ml  Output      0 ml  Net      0 ml   Exam: General: No acute respiratory distress at rest Lungs: Distant breath sounds throughout all fields, no wheeze appreciable, no focal crackles Cardiovascular: Heart sounds are distant, regular rate and rhythm, no appreciable murmur gallop or rub Abdomen: Nontender, nondistended, soft, bowel sounds positive, no rebound, no ascites, no appreciable mass Extremities: No significant cyanosis, clubbing, or edema bilateral lower extremities  Data Reviewed: Basic Metabolic Panel:  Recent Labs Lab 02/19/13 0234 02/19/13 1423 02/20/13 0541 02/21/13 0443 02/22/13 0443  NA 137 137 142 139 138  K 4.3 4.7 4.0 3.8 4.2  CL 91* 94* 95* 91* 92*  CO2 39* 37* 38* 43* 40*  GLUCOSE 136* 163* 117* 149* 181*  BUN 34* 34* 33* 36* 34*  CREATININE 1.07 1.09 1.00 1.10 0.98  CALCIUM 8.9 9.4 8.9 9.4 9.3   Liver Function Tests: No results found for this basename: AST, ALT, ALKPHOS, BILITOT, PROT, ALBUMIN,  in the last 168 hours  CBC:  Recent Labs Lab 02/16/13 2310 02/16/13 2326  WBC 14.1*  --  HGB 12.3 13.6  HCT 38.0 40.0  MCV 94.3  --   PLT 170  --    BNP (last 3 results)  Recent Labs  02/17/13 0940 02/19/13 1423  PROBNP 5205.0* 713.5*   CBG:  Recent Labs Lab 02/21/13 1102 02/21/13 1629 02/21/13 2200 02/22/13 0808 02/22/13 1227  GLUCAP 157* 250* 160* 182* 195*    Studies:  Recent x-ray studies have been reviewed in detail by the Attending Physician  Scheduled Meds:  Scheduled Meds: . albuterol  2.5 mg Nebulization QID  .  budesonide-formoterol  2 puff Inhalation BID  . docusate sodium  100 mg Oral BID  . enoxaparin (LOVENOX) injection  40 mg Subcutaneous Q24H  . insulin aspart  0-15 Units Subcutaneous TID WC  . ipratropium  0.5 mg Nebulization QID  . levofloxacin  750 mg Oral Q48H  . levothyroxine  50 mcg Oral Q breakfast  . pantoprazole  40 mg Oral Daily  . polycarbophil  1,250 mg Oral Daily  . polyethylene glycol  17 g Oral Daily  . potassium chloride  10 mEq Oral Daily  . [START ON 02/23/2013] predniSONE  40 mg Oral Q breakfast    Time spent on care of this patient: 35 mins   [facility] T  [facility] Office  [phone] Pager - Text Page per Loretha Stapler as per below:  On-Call/Text Page:      [email or web address]      password [email or web address]  If 7PM-7AM, please contact night-coverage [email or web address] Password [email or web address] 02/22/2013, 2:59 PM   LOS: 6 days

## 2013-02-22 NOTE — Progress Notes (Signed)
Inpatient Diabetes Program Recommendations  AACE/ADA: New Consensus Statement on Inpatient Glycemic Control (2013)  Target Ranges:  Prepandial:   less than 140 mg/dL      Peak postprandial:   less than 180 mg/dL (1-2 hours)      Critically ill patients:  140 - 180 mg/dL   Reason for Visit: Results for Sandra Cuevas, Sandra Cuevas (MRN [id number]) as of 02/22/2013 10:54  Ref. Range 02/21/2013 11:02 02/21/2013 16:29 02/21/2013 22:00 02/22/2013 08:08  Glucose-Capillary Latest Range: 70-99 mg/dL 409 (H) 811 (H) 914 (H) 182 (H)   Note patient is on PO Prednisone. May consider adding Novolog meal coverage 3 units tid with meals (Hold if patient eats less than 50%).  Will follow. Thanks, Beryl Meager, RN, BC-ADM Inpatient Diabetes Coordinator Pager [phone]

## 2013-02-22 NOTE — Progress Notes (Signed)
Utilization review completed.  

## 2013-02-22 NOTE — Progress Notes (Signed)
PULMONARY  / CRITICAL CARE MEDICINE  Name: Sandra Cuevas MRN: [id number] DOB: 1928/04/14    ADMISSION DATE:  02/16/2013 CONSULTATION DATE:  02/21/13  REFERRING MD :  [facility] PRIMARY SERVICE: [facility]   CHIEF COMPLAINT:  Consult for Hpercapnia  BRIEF PATIENT DESCRIPTION: 79 yo admitted 11/11 for acute diastolic failure by [facility].  Pulmonary consult on 11/16 for hypercarbia which is probably due to chronic asthma.   SUBJECTIVE: Feels better today  VITAL SIGNS: Temp:  [97.6 F (36.4 C)-98.5 F (36.9 C)] 98.3 F (36.8 C) (11/17 0738) Pulse Rate:  [64-88] 64 (11/17 0708) Resp:  [16-26] 26 (11/17 0708) BP: (97-149)/(44-85) 118/44 mmHg (11/17 0708) SpO2:  [92 %-99 %] 94 % (11/17 0918) HEMODYNAMICS:   VENTILATOR SETTINGS:   INTAKE / OUTPUT: Intake/Output     11/16 0701 - 11/17 0700 11/17 0701 - 11/18 0700   P.O. 600    Total Intake(mL/kg) 600 (6.4)    Urine (mL/kg/hr) 250 (0.1)    Total Output 250     Net +350          Urine Occurrence 3 x    Stool Occurrence 2 x      PHYSICAL EXAMINATION: Gen. Sitting up in bed, comfortable HEENT: NCAT, PERRL, EOMi PULM: Wheezing bilaterally but good air movement CV: RRR, no mgr AB: BS+, soft, nontender Ext: trace edema, warm Neuro: A&Ox4, maew  LABS:  CBC  Recent Labs Lab 02/16/13 2310 02/16/13 2326  WBC 14.1*  --   HGB 12.3 13.6  HCT 38.0 40.0  PLT 170  --    Coag's No results found for this basename: APTT, INR,  in the last 168 hours BMET  Recent Labs Lab 02/20/13 0541 02/21/13 0443 02/22/13 0443  NA 142 139 138  K 4.0 3.8 4.2  CL 95* 91* 92*  CO2 38* 43* 40*  BUN 33* 36* 34*  CREATININE 1.00 1.10 0.98  GLUCOSE 117* 149* 181*   Electrolytes  Recent Labs Lab 02/20/13 0541 02/21/13 0443 02/22/13 0443  CALCIUM 8.9 9.4 9.3   Sepsis Markers No results found for this basename: LATICACIDVEN, PROCALCITON, O2SATVEN,  in the last 168 hours ABG  Recent Labs Lab 02/21/13 1052 02/21/13 1645  PHART 7.379 7.375   PCO2ART 74.9* 75.3*  PO2ART 67.1* 154.0*   Liver Enzymes No results found for this basename: AST, ALT, ALKPHOS, BILITOT, ALBUMIN,  in the last 168 hours Cardiac Enzymes  Recent Labs Lab 02/17/13 0940 02/19/13 1423  PROBNP 5205.0* 713.5*   Glucose  Recent Labs Lab 02/20/13 2100 02/21/13 0720 02/21/13 1102 02/21/13 1629 02/21/13 2200 02/22/13 0808  GLUCAP 130* 116* 157* 250* 160* 182*    Imaging   CXR: Cardiomegaly without CHF or pneumonia . Basilar atelectasis   ASSESSMENT / PLAN:  PULMONARY A:  Asthmatic Bronchitis in never smoker > she said she had asthma while working in a [employer] for 13 years Hypercarbia ? Etiology  Mild hypoxemia due to bronchitis, chronic obstruction P:   Albuterol/atrovent nebs qid IV solumedrol daily> last dose today Prednisone taper starts 11/18 Start Symbicort until follow up in Pulmonary office RT to give [facility] instruction 11/17 Continue on Levaquin  O2 for sat >90.  PFT's as outpatient   CARDIOVASCULAR A:  Acute Diastolic Dysfunction > resolving P:  OK to restart ACE and b-blocker from my standpoint Use ca channel blocker    INFECTIOUS A:  Asthmatic bronchitis  >afebrile  P:   Cont Levaquin    TODAY'S SUMMARY: Continue to treat as  asthmatic bronchitis with steroids/ bronchodilators; stop solumedrol after today's dose, prednisone taper, start symbicort, will start to make arrangements for outpatient follow up  Max Fickle  Pulmonary and Critical Care Medicine [facility] Pager: [phone]  02/22/2013, 10:42 AM

## 2013-02-22 NOTE — Evaluation (Signed)
Clinical/Bedside Swallow Evaluation Patient Details  Name: Sandra Cuevas MRN: [id number] Date of Birth: 11/06/1928  Today's Date: 02/22/2013 Time: 0830-     Past Medical History:  Past Medical History  Diagnosis Date  . Hyperlipidemia   . Hypertension   . Atypical chest pain   . Hypothyroidism   . Diabetes mellitus   . History of cardiovascular stress test 06/01/2010    EF 71% - no evidence of ischemia, normal left ventricular systolic function  . Shortness of breath     on exertion  . History of hysterectomy 1965  . GERD (gastroesophageal reflux disease)   . 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 Cardiac catheterization  2001    EF=65%  . Hemorrhoid surgery    . Abdominal hysterectomy     HPI:  79 year old female with history of chronic dyspnea, atypical chest pains with negative stress test in 2012, hx of asthma, hypertension and diabetes mellitus who presented to the ED with progress shortness of breath for several weeks. She reports mainly dyspnea on exertion. She reports off and on leg swellings as well.  Does report cough with whitish phlegm. Denies wheezing. Denies fever, chills, headache, blurred vision, nausea, vomiting, chest pain, palpitations, abdominal pain, bowel or urinary symptoms. Denies any sick contacts or recent travel. Patient was noted to be short of breath with O2 sat in 70s on room and. Chest x-ray showed Cardiomegaly without CHF or pneumonia.  MD notes Asthmatic Bronchitis in never smoker Hypercarbia ? Etiology.     Assessment / Plan / Recommendation Clinical Impression  Pt. reports history of burning sensation in chest related to change of positions (sit to supine, rolling over in bed) and infrequent coughing with liquids.  Oropharyngeal swallow function was WFL's without inidication of aspiration or dysfunction.  Suspect intermittent esophageal involvement which can  result in aspiration if residue in esophagus.  SLP educated pt. regarding esophageal precautions to minimize symptoms and she verbalized understanding.  Continue regular texture diet and thin liquids, pills with liquids, straws ok.  No St follow up needed.    Aspiration Risk   (mild-mod)    Diet Recommendation Regular;Thin liquid   Liquid Administration via: Cup;Straw Medication Administration: Whole meds with liquid Supervision: Patient able to self feed Compensations: Slow rate;Small sips/bites Postural Changes and/or Swallow Maneuvers: Seated upright 90 degrees;Upright 30-60 min after meal    Other  Recommendations Oral Care Recommendations: Oral care BID   Follow Up Recommendations  None    Frequency and Duration        Pertinent Vitals/Pain n/a         Swallow Study         Oral/Motor/Sensory Function Overall Oral Motor/Sensory Function: Appears within functional limits for tasks assessed   Ice Chips Ice chips: Not tested   Thin Liquid Thin Liquid: Within functional limits    Nectar Thick Nectar Thick Liquid: Not tested   Honey Thick Honey Thick Liquid: Not tested   Puree Puree: Not tested   Solid   GO    Solid: Within functional limits       Sandra Cuevas [employer] [phone]  02/22/2013

## 2013-02-23 ENCOUNTER — Telehealth: Payer: Self-pay | Admitting: *Deleted

## 2013-02-23 LAB — GLUCOSE, CAPILLARY: Glucose-Capillary: 254 mg/dL — ABNORMAL HIGH (ref 70–99)

## 2013-02-23 NOTE — Telephone Encounter (Signed)
Per BQ - pt can't come to the [place] office. Pt needs to be scheduled with TP for HFU.  This has been scheduled for 03/09/13 at 3pm. Pt is aware of her appointment info. She will have her daughter call back to make sure that date and time work for them.

## 2013-02-23 NOTE — Evaluation (Signed)
Physical Therapy Evaluation Patient Details Name: Sandra Cuevas MRN: [id number] DOB: 1929/02/03 Today's Date: 02/23/2013 Time: 1191-4782 PT Time Calculation (min): 25 min  PT Assessment / Plan / Recommendation History of Present Illness  79 year old female with history of chronic dyspnea, atypical chest pains with negative stress test in 2012, hx of  asthma, hypertension and diabetes mellitus who presented to the ED with progress shortness of breath for several weeks  Clinical Impression  Patient ambulated with RW and portable O2 today. She needs instruction on energy conservation during activity as she is weaned off O2 support as well as gait training with the RW and cane. She plans to return home with her husband and would benefit from continued therapy to improve safety and work on below deficits.    PT Assessment  Patient needs continued PT services    Follow Up Recommendations  Home health PT    Does the patient have the potential to tolerate intense rehabilitation      Barriers to Discharge        Equipment Recommendations       Recommendations for Other Services     Frequency Min 3X/week    Precautions / Restrictions Precautions Precautions: Fall Restrictions Weight Bearing Restrictions: No   Pertinent Vitals/Pain SpO2 89% RA at rest; 93% on 2L with ambulation;      Mobility  Bed Mobility Bed Mobility: Supine to Sit Supine to Sit: 5: Supervision Details for Bed Mobility Assistance: Pt used UE support of bed rail to pull herself up. Transfers Transfers: Sit to Stand;Stand to Sit Sit to Stand: 4: Min guard;From toilet;From bed;With upper extremity assist Stand to Sit: To toilet;To chair/3-in-1;With upper extremity assist;4: Min guard Details for Transfer Assistance: Needs cues for safe use of AD. Ambulation/Gait Ambulation/Gait Assistance: 4: Min guard Ambulation Distance (Feet): 200 Feet Assistive device: Rolling walker Ambulation/Gait Assistance Details:  Guard for safety; cues for posture. Gait Pattern: Decreased stride length;Step-through pattern;Trunk flexed Gait velocity: decreased General Gait Details: dependent on UE support;guarding for balance  Stairs: No    Exercises     PT Diagnosis: Difficulty walking;Generalized weakness  PT Problem List: Decreased activity tolerance;Decreased mobility;Decreased knowledge of use of DME decreased strength; PT Treatment Interventions: DME instruction;Gait training;Functional mobility training;Therapeutic activities;Therapeutic exercise;Stair training     PT Goals(Current goals can be found in the care plan section) Acute Rehab PT Goals Patient Stated Goal: Return home; continue to be as active at home as she can PT Goal Formulation: With patient Time For Goal Achievement: 03/09/13 Potential to Achieve Goals: Good  Visit Information  Last PT Received On: 02/23/13 Assistance Needed: +1 History of Present Illness: 79 year old female with history of chronic dyspnea, atypical chest pains with negative stress test in 2012, hx of  asthma, hypertension and diabetes mellitus who presented to the ED with progress shortness of breath for several weeks       Prior Functioning  Home Living Family/patient expects to be discharged to:: Private residence Living Arrangements: Spouse/significant other Available Help at Discharge: Family Type of Home: House Home Access: Stairs to enter Secretary/administrator of Steps: 1- pt states she holds onto cabinet on left Entrance Stairs-Rails: None Home Layout: One level Home Equipment: Cane - single point;Walker - 2 wheels;Bedside commode Prior Function Level of Independence: Independent with assistive device(s) ADL's / Homemaking Assistance Needed: cleaning lady every 2wks. Pt does all meals Comments: pt states she uses cane in the community. At the store she relies on leaning on the cart. Drives  to beauty shop Communication Communication: No difficulties     Cognition  Cognition Arousal/Alertness: Awake/alert Behavior During Therapy: WFL for tasks assessed/performed Overall Cognitive Status: Within Functional Limits for tasks assessed    Extremity/Trunk Assessment Upper Extremity Assessment Upper Extremity Assessment: Defer to OT evaluation Lower Extremity Assessment Lower Extremity Assessment: Generalized weakness   Balance    End of Session PT - End of Session Equipment Utilized During Treatment: Gait belt Activity Tolerance: Patient tolerated treatment well Patient left: in chair;with call bell/phone within reach  GP     Willette Pa, SPT 02/23/2013, 2:40 PM

## 2013-02-23 NOTE — Progress Notes (Addendum)
NURSING PROGRESS NOTE  Sandra Cuevas [id number] Transfer Data: 02/23/2013 3:34 PM Attending Provider: Calvert Cantor, MD WGN:[id number], Chancy Hurter, MD Code Status: full  Sandra Cuevas is a 79 y.o. female patient transferred from 3S -No acute distress noted.  -No complaints of shortness of breath.  -No complaints of chest pain.   Cardiac Monitoring: Box # [id number] in place. Cardiac monitor yields:normal sinus rhythm.  Blood pressure 135/58, pulse 62, temperature 97.5 F (36.4 C), temperature source Oral, resp. rate 27, height 5\' 6"  (1.676 m), weight 93.5 kg (206 lb 2.1 oz), SpO2 96.00%.   IV Fluids:  IV in place, occlusive dsg intact without redness, IV cath forearm left, condition patent and no redness none.   Allergies:  Review of patient's allergies indicates no known allergies.  Past Medical History:   has a past medical history of Hyperlipidemia; Hypertension; Atypical chest pain; Hypothyroidism; Diabetes mellitus; History of cardiovascular stress test (06/01/2010); Shortness of breath; History of hysterectomy (1965); GERD (gastroesophageal reflux disease); and Cancer.  Past Surgical History:   has past surgical history that includes Cardiac catheterization (2001); Hemorrhoid surgery; and Abdominal hysterectomy.  Social History:   reports that she has never smoked. She has never used smokeless tobacco. She reports that she does not drink alcohol or use illicit drugs.  Skin: dry and intact   Patient/Family orientated to room. Call light within reach. Patient/family able to voice and demonstrate understanding of unit orientation instructions.    Will continue to evaluate and treat per MD orders.

## 2013-02-23 NOTE — Evaluation (Signed)
Seen and agree with SPT note Rachelann Enloe Tabor Navie Lamoreaux, PT [phone]  

## 2013-02-23 NOTE — Progress Notes (Signed)
Pt report called to 5W-pt to transfer per MD order. All belongs sent with patient, family at bedside aware of new room. VSS, all meds current to time.

## 2013-02-23 NOTE — Progress Notes (Signed)
[facility] Progress Note [place] TEAM 1 - Stepdown/ICU TEAM   Sandra Cuevas WUJ:[id number] DOB: 1929-04-04 DOA: 02/16/2013 PCP: Colette Ribas, MD  Admit HPI / Brief Narrative: 79 year old female with history of chronic dyspnea, atypical chest pains with negative stress test in 2012, hx of asthma, hypertension, and diabetes mellitus who presented to the ED with progressive shortness of breath for several weeks. She reported mainly dyspnea on exertion. She reported off and on leg swelling as well. Denied dyspnea at rest, orthopnea or PND.   Course in the ED: Patient was noted to be short of breath with O2 sat in 70s on room air. Chest x-ray showed emphysematous changes. Blood pressure was also low at 87/69 mmHg. CT angiogram of the chest was done which was negative for PE. Patient given nebulizer and placed on facemask and admitted to telemetry.   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   Acute diastolic heart failure (grede 2 via TTE) If Is/Os are accurate, pt has actually diuresed very little since admit (only -622 net) - no evidence of signif volume overload on exam - weight is about the same - orders strict Is/Os and daily wgts - resumed home diuretic dose  DM CBGs stable on steroids  HYPERLIPIDEMIA  Resume lipitor  HYPERTENSION No change in tx plan today  Hypothyroidism On home replacement dose  Code Status: FULL Family Communication: Spoke with patient and husband of 66 years at bedside Disposition Plan: transfer to med/surg.  Consultants: Pulmonary   Procedures: none  Antibiotics: Levaquin 11/11 >>  DVT prophylaxis: lovenox  HPI/Subjective: The patient reports that she is better but not at her baseline- was able to ambulate in hall with O2  today. No cough.   Objective: Blood pressure 115/43, pulse 62, temperature 97.9 F (36.6 C), temperature source Oral, resp. rate 27, height 5\' 6"  (1.676 m), weight 93.5 kg (206 lb 2.1 oz), SpO2 96.00%. No intake or output data in the 24 hours ending 02/23/13 1309 Exam: General: No acute respiratory distress at rest Lungs: Distant breath sounds throughout all fields, no wheeze appreciable, no focal crackles Cardiovascular: Heart sounds are distant, regular rate and rhythm, no appreciable murmur gallop or rub Abdomen: Nontender, nondistended, soft, bowel sounds positive, no rebound, no ascites, no appreciable mass Extremities: No significant cyanosis, clubbing, or edema bilateral lower extremities  Data Reviewed: Basic Metabolic Panel:  Recent Labs Lab 02/19/13 0234 02/19/13 1423 02/20/13 0541 02/21/13 0443 02/22/13 0443  NA 137 137 142 139 138  K 4.3 4.7 4.0 3.8 4.2  CL 91* 94* 95* 91* 92*  CO2 39* 37* 38* 43* 40*  GLUCOSE 136* 163* 117* 149* 181*  BUN 34* 34* 33* 36* 34*  CREATININE 1.07 1.09 1.00 1.10 0.98  CALCIUM 8.9 9.4 8.9 9.4 9.3   Liver Function Tests: No results found for this basename: AST, ALT, ALKPHOS, BILITOT, PROT, ALBUMIN,  in the last 168 hours  CBC:  Recent Labs Lab 02/16/13 2310 02/16/13 2326  WBC 14.1*  --   HGB 12.3 13.6  HCT 38.0 40.0  MCV 94.3  --   PLT 170  --    BNP (last 3 results)  Recent Labs  02/17/13 0940 02/19/13 1423  PROBNP 5205.0* 713.5*   CBG:  Recent  Labs Lab 02/22/13 1227 02/22/13 1653 02/22/13 2144 02/23/13 0741 02/23/13 1201  GLUCAP 195* 225* 229* 105* 141*    Studies:  Recent x-ray studies have been reviewed in detail by the Attending Physician  Scheduled Meds:  Scheduled Meds: . budesonide-formoterol  2 puff Inhalation BID  . docusate sodium  100 mg Oral BID  . enoxaparin (LOVENOX) injection  40 mg Subcutaneous Q24H  . furosemide  40 mg Oral Daily  . insulin aspart  0-15 Units Subcutaneous TID WC  .  insulin glargine  10 Units Subcutaneous QHS  . levofloxacin  750 mg Oral Q48H  . levothyroxine  50 mcg Oral Q breakfast  . pantoprazole  40 mg Oral Daily  . polycarbophil  1,250 mg Oral Daily  . polyethylene glycol  17 g Oral Daily  . potassium chloride  10 mEq Oral Daily  . predniSONE  40 mg Oral Q breakfast    Time spent on care of this patient: 35 mins   Calvert Cantor, MD  [facility] Office  [phone] Pager - Text Page per Loretha Stapler as per below:  On-Call/Text Page:      [email or web address]      password [email or web address]  If 7PM-7AM, please contact night-coverage [email or web address] Password [email or web address] 02/23/2013, 1:09 PM   LOS: 7 days

## 2013-02-23 NOTE — Telephone Encounter (Signed)
Message copied by Caryl Ada on Tue Feb 23, 2013  8:36 AM ------      Message from: Max Fickle B      Created: Mon Feb 22, 2013  6:02 PM       That is OK by me      ----- Message -----         From: Caryl Ada, CMA         Sent: 02/22/2013  10:52 AM           To: Lupita Leash, MD            Neither you nor Dr. Vassie Loll have any openings in 1-2 weeks. You having openings on 03/23/13 in [place].            Thanks,      Lillia Abed      ----- Message -----         From: Lupita Leash, MD         Sent: 02/22/2013  10:50 AM           To: Oretha Milch, MD, [facility] Triage Pool            Hi All,            This lady needs a hospital follow up with either me or Dr. Vassie Loll, whoever has the first available appointment in 1-2 weeks.            Reason asthma      Needs PFT's on day of appointment            Thanks      Kipp Brood             ------

## 2013-02-23 NOTE — Progress Notes (Signed)
PULMONARY  / CRITICAL CARE MEDICINE  Name: Sandra Cuevas MRN: [id number] DOB: [birth year]    ADMISSION DATE:  02/16/2013 CONSULTATION DATE:  02/21/13  REFERRING MD :  [facility] PRIMARY SERVICE: [facility]   CHIEF COMPLAINT:  Consult for Hpercapnia  BRIEF PATIENT DESCRIPTION: 79 yo admitted 11/11 for acute diastolic failure by [facility].  Pulmonary consult on 11/16 for hypercarbia which is probably due to chronic asthma.   SUBJECTIVE: States she continues to feel better.  VITAL SIGNS: Temp:  [97.7 F (36.5 C)-98 F (36.7 C)] 97.9 F (36.6 C) (11/18 0709) Pulse Rate:  [62-70] 62 (11/18 0709) Resp:  [12-28] 27 (11/18 0709) BP: (115-141)/(43-57) 115/43 mmHg (11/18 0709) SpO2:  [92 %-98 %] 96 % (11/18 0800) FiO2 (%):  [28 %] 28 % (11/17 1143) Weight:  [206 lb 2.1 oz (93.5 kg)] 206 lb 2.1 oz (93.5 kg) (11/18 0456) HEMODYNAMICS:   VENTILATOR SETTINGS: Vent Mode:  [-]  FiO2 (%):  [28 %] 28 % INTAKE / OUTPUT: Intake/Output     11/17 0701 - 11/18 0700 11/18 0701 - 11/19 0700   P.O.     Total Intake(mL/kg)     Urine (mL/kg/hr)     Total Output       Net            Urine Occurrence 7 x      PHYSICAL EXAMINATION: Gen. Sitting up in chair, comfortable in NAD HEENT: NCAT, PERRL, EOMi PULM: Clear, no wheezing, good air movement CV: RRR, no mgr AB: BS+, soft, nontender Ext: trace edema, warm Neuro: A&Ox4, maew, appropriate  LABS:  CBC  Recent Labs Lab 02/16/13 2310 02/16/13 2326  WBC 14.1*  --   HGB 12.3 13.6  HCT 38.0 40.0  PLT 170  --    Coag's No results found for this basename: APTT, INR,  in the last 168 hours BMET  Recent Labs Lab 02/20/13 0541 02/21/13 0443 02/22/13 0443  NA 142 139 138  K 4.0 3.8 4.2  CL 95* 91* 92*  CO2 38* 43* 40*  BUN 33* 36* 34*  CREATININE 1.00 1.10 0.98  GLUCOSE 117* 149* 181*   Electrolytes  Recent Labs Lab 02/20/13 0541 02/21/13 0443 02/22/13 0443  CALCIUM 8.9 9.4 9.3   Sepsis Markers No results found for this basename:  LATICACIDVEN, PROCALCITON, O2SATVEN,  in the last 168 hours ABG  Recent Labs Lab 02/21/13 1052 02/21/13 1645  PHART 7.379 7.375  PCO2ART 74.9* 75.3*  PO2ART 67.1* 154.0*   Liver Enzymes No results found for this basename: AST, ALT, ALKPHOS, BILITOT, ALBUMIN,  in the last 168 hours Cardiac Enzymes  Recent Labs Lab 02/17/13 0940 02/19/13 1423  PROBNP 5205.0* 713.5*   Glucose  Recent Labs Lab 02/21/13 2200 02/22/13 0808 02/22/13 1227 02/22/13 1653 02/22/13 2144 02/23/13 0741  GLUCAP 160* 182* 195* 225* 229* 105*    Imaging   CXR: Cardiomegaly without CHF or pneumonia . Basilar atelectasis (11/16)   ASSESSMENT / PLAN:  PULMONARY A:  Asthmatic Bronchitis in never smoker > she said she had asthma while working in a [employer] for 13 years Hypercarbia ? Etiology  Mild hypoxemia due to bronchitis, chronic obstruction P:   Albuterol/atrovent nebs scheduled qid IV solumedrol > last dose 11/17 Prednisone taper starts 11/18 Start Symbicort ( 11/17) until follow up in Pulmonary office RT to give [facility] instruction 11/17 Continue on Levaquin ( asthmatic bronchitis) O2 for sat >90.( 1.5 L on 11/18) Trend CO2 per BMET  PFT's as outpatient  CARDIOVASCULAR A:  Acute Diastolic Dysfunction > resolving       Edema> resolving P:  OK to restart ACE and b-blocker Use ca channel blocker    INFECTIOUS A:  Asthmatic bronchitis  >afebrile  P:   Cont Levaquin  Trend fever curve Trend WBC   TODAY'S SUMMARY: Continue to treat as asthmatic bronchitis with steroids/ bronchodilators; solumedrol stopped 11/17, prednisone taper started 11/18,  symbicort started 11/17, will need to make arrangements for outpatient follow up, PFT's.  Scribed by Kandice Robinsons, RN ACNP-Student  [facility] for Canary Brim, NP-C  02/23/2013, 11:16 AM    Attending:  I have seen and examined the patient with nurse practitioner/resident and agree with the note above.   Prednisone  taper Symbicort Making arrangements for outpatient f/u  Yolonda Kida PCCM Pager: [phone] Cell: [phone] If no response, call [phone]

## 2013-02-23 NOTE — Care Management Note (Addendum)
    Page 1 of 2   02/25/2013     11:27:20 AM   CARE MANAGEMENT NOTE 02/25/2013  Patient:  Sandra Cuevas, Sandra Cuevas   Account Number:  [id number]  Date Initiated:  02/17/2013  Documentation initiated by:  Darlyne Russian  Subjective/Objective Assessment:   Patient admitted with SOB, POX  65 % when EMS arrived     Action/Plan:   Progression of care and discharge planning. NCM to follow for home health and DME needs.   Anticipated DC Date:  02/25/2013   Anticipated DC Plan:  HOME W HOME HEALTH SERVICES      DC Planning Services  CM consult      [facility]  HOME HEALTH   Choice offered to / List presented to:  C-1 Patient   DME arranged  [facility]      DME agency  [facility]     HH arranged  HH-1 RN  HH-2 PT      [facility] agency  [facility]   Status of service:  Completed, signed off Medicare Important Message given?   (If response is "NO", the following Medicare IM given date fields will be blank) Date Medicare IM given:   Date Additional Medicare IM given:    Discharge Disposition:  HOME W HOME HEALTH SERVICES  Per UR Regulation:  Reviewed for med. necessity/level of care/duration of stay  If discussed at Long Length of Stay Meetings, dates discussed:   02/23/2013    Comments:  02/24/13 11:25 Letha Cape RN, BSN [phone] per ot, patient has not ot needs, will cancel hhot, notified Lupita Leash with [facility]. Patient is for dc today and will require home oxygen.  02/23/13 10:49 Letha Cape RN, BSN [phone] patient lives with spouse, patient agreeable to [facility], PT, OT, chose [facility], referral made to [facility], Soc will begin 24-48 hrs post discharge.  Patient has medication coverage and transportation at discharge.  02/23/13- 1500- Donn Pierini RN, BSN [phone] per PT notes recommending HH-PT- if pt agreeable will need HH orders prior to discharge- will f/u with pt.   02/19/2013  1100 Darlyne Russian RN,CCM  [phone] Met with patient to discuss discharge planning  and home health heart failure program. Explained the focus of the program to reinforce the heart failure regimen. She follows a low salt diet, monitors her weight, uses a cane, rests when SOB, elevates her legs,has a f/u with her cardiologist and declines the home health program. NCM encouraged her to continue with monitoring her weight daily, leg swelling, SOB and to notify MD of any changes.

## 2013-02-23 NOTE — Progress Notes (Signed)
OT Cancellation Note  Patient Details Name: Sandra Cuevas MRN: [id number] DOB: 07/02/1928   Cancelled Treatment:    Reason Eval/Treat Not Completed: Patient at procedure or test/ unavailable;Other (comment) (patient currently being moved to 5W)  [name] 02/23/2013, 3:42 PM

## 2013-02-23 NOTE — Telephone Encounter (Signed)
lmtcb x1 

## 2013-02-24 LAB — GLUCOSE, CAPILLARY
Glucose-Capillary: 177 mg/dL — ABNORMAL HIGH (ref 70–99)
Glucose-Capillary: 148 mg/dL — ABNORMAL HIGH (ref 70–99)
Glucose-Capillary: 125 mg/dL — ABNORMAL HIGH (ref 70–99)

## 2013-02-24 MED ORDER — ALBUTEROL SULFATE (5 MG/ML) 0.5% IN NEBU
2.5000 mg | INHALATION_SOLUTION | Freq: Three times a day (TID) | RESPIRATORY_TRACT | Status: DC
  Administered 2013-02-24 – 2013-02-25 (×3): 2.5 mg via RESPIRATORY_TRACT
  Filled 2013-02-24 (×3): qty 0.5

## 2013-02-24 MED ORDER — TROLAMINE SALICYLATE 10 % EX CREA
TOPICAL_CREAM | Freq: Two times a day (BID) | CUTANEOUS | Status: DC | PRN
  Filled 2013-02-24: qty 85

## 2013-02-24 MED ORDER — LISINOPRIL 20 MG PO TABS
20.0000 mg | ORAL_TABLET | Freq: Every day | ORAL | Status: DC
  Administered 2013-02-24 – 2013-02-25 (×2): 20 mg via ORAL
  Filled 2013-02-24 (×2): qty 1

## 2013-02-24 MED ORDER — MUSCLE RUB 10-15 % EX CREA
TOPICAL_CREAM | Freq: Two times a day (BID) | CUTANEOUS | Status: DC | PRN
  Filled 2013-02-24: qty 85

## 2013-02-24 MED ORDER — ALBUTEROL SULFATE HFA 108 (90 BASE) MCG/ACT IN AERS: 2.0000 | INHALATION_SPRAY | Freq: Four times a day (QID) | RESPIRATORY_TRACT | Status: DC

## 2013-02-24 MED ORDER — FUROSEMIDE 10 MG/ML IJ SOLN
40.0000 mg | Freq: Once | INTRAMUSCULAR | Status: AC
  Administered 2013-02-24: 13:00:00 40 mg via INTRAVENOUS
  Filled 2013-02-24: qty 4

## 2013-02-24 NOTE — Progress Notes (Signed)
Chaplain visited pt and pt's daughter and husband.  Pt spoke of possibly being discharged soon.  Pt requested chaplain to pray.     02/24/13 1400  Clinical Encounter Type  Visited With Patient and family together  Visit Type Spiritual support  Spiritual Encounters  Spiritual Needs Emotional   Sandra Cuevas

## 2013-02-24 NOTE — Progress Notes (Signed)
[facility] Progress Note    SAFIYYA STOKES ZOX:[id number] DOB: [birth year] DOA: 02/16/2013 PCP: Colette Ribas, MD  Admit HPI / Brief Narrative: 79 year old female with history of chronic dyspnea, atypical chest pains with negative stress test in 2012, hx of asthma, hypertension, and diabetes mellitus who presented to the ED with progressive shortness of breath for several weeks. She reported mainly dyspnea on exertion. She reported off and on leg swelling as well. Denied dyspnea at rest, orthopnea or PND.   Course in the ED: Patient was noted to be short of breath with O2 sat in 70s on room air. Chest x-ray showed emphysematous changes. Blood pressure was also low at 87/69 mmHg. CT angiogram of the chest was done which was negative for PE. Patient given nebulizer and placed on facemask and admitted to telemetry.   Assessment/Plan:  Bronchospastic airway disease / asthmatic bronchitis  / bisinosis Was seen by Pulmonology, Dr. Kendrick Fries) On Symbicort. Steroids changed from IV to prednisone 40 mg 11/18 Nebs currently PRN. Recommend PFTs outpatient. Patient on q 48 hour levaquin.  Acute Hypoxic failure - Secondary to presumed underlying COPD, byssinosis - Will require home O2  - CT and urine negative on admission  Acute diastolic heart failure (grede 2 via TTE) - Clinically compensated - D. Lasix  DM CBGs stable on steroids - Continued SSI and Lantus- would like to transition to metformin on discharge- A1c of 6.3  HYPERLIPIDEMIA  Resume lipitor  HYPERTENSION Will add back lisinopril 20 q day (11/19)  Hypothyroidism On home replacement dose  Code Status: FULL Family Communication:  Disposition Plan: inpatient. Consultants: Pulmonary   Procedures: none  Antibiotics: Levaquin 11/11 >>  DVT prophylaxis: lovenox  HPI/Subjective: No complaints.  Not on oxygen at home.  Objective: Blood pressure 124/72, pulse 62, temperature 98.1 F (36.7 C), temperature  source Oral, resp. rate 18, height 5\' 6"  (1.676 m), weight 91.491 kg (201 lb 11.2 oz), SpO2 92.00%.  Intake/Output Summary (Last 24 hours) at 02/24/13 1209 Last data filed at 02/24/13 0847  Gross per 24 hour  Intake    480 ml  Output      0 ml  Net    480 ml   Exam: General: awake, alert, pleasant, sitting on the side of the bed. Lungs: Mild wheeze heard, no accessory muscle movement. Cardiovascular:  regular rate and rhythm, no appreciable murmur gallop or rub Abdomen: Nontender, nondistended, soft, bowel sounds positive, no rebound, no ascites, no appreciable mass Extremities: No significant cyanosis, clubbing, or edema bilateral lower extremities  Data Reviewed: Basic Metabolic Panel:  Recent Labs Lab 02/19/13 0234 02/19/13 1423 02/20/13 0541 02/21/13 0443 02/22/13 0443  NA 137 137 142 139 138  K 4.3 4.7 4.0 3.8 4.2  CL 91* 94* 95* 91* 92*  CO2 39* 37* 38* 43* 40*  GLUCOSE 136* 163* 117* 149* 181*  BUN 34* 34* 33* 36* 34*  CREATININE 1.07 1.09 1.00 1.10 0.98  CALCIUM 8.9 9.4 8.9 9.4 9.3   BNP (last 3 results)  Recent Labs  02/17/13 0940 02/19/13 1423  PROBNP 5205.0* 713.5*   CBG:  Recent Labs Lab 02/23/13 0741 02/23/13 1201 02/23/13 1703 02/23/13 2149 02/24/13 0745  GLUCAP 105* 141* 254* 173* 102*    Studies:    Scheduled Meds:  Scheduled Meds: . albuterol  2.5 mg Nebulization TID  . budesonide-formoterol  2 puff Inhalation BID  . docusate sodium  100 mg Oral BID  . enoxaparin (LOVENOX) injection  40 mg Subcutaneous Q24H  .  furosemide  40 mg Intravenous Once  . furosemide  40 mg Oral Daily  . insulin aspart  0-15 Units Subcutaneous TID WC  . insulin glargine  10 Units Subcutaneous QHS  . levofloxacin  750 mg Oral Q48H  . levothyroxine  50 mcg Oral Q breakfast  . lisinopril  20 mg Oral Daily  . pantoprazole  40 mg Oral Daily  . polycarbophil  1,250 mg Oral Daily  . polyethylene glycol  17 g Oral Daily  . potassium chloride  10 mEq Oral  Daily  . predniSONE  40 mg Oral Q breakfast     Conley Canal [phone] [facility] Office  [phone] Pager - Text Page per Loretha Stapler as per below:  On-Call/Text Page:      [email or web address]      password [email or web address]  If 7PM-7AM, please contact night-coverage [email or web address] Password [email or web address] 02/24/2013, 12:09 PM   LOS: 8 days   Attending Patient seen and examined, the with the above assessment and plan. Continue with current care, likely home tomorrow Long discussion with daughter at bedside- will need further workup to be done in the outpatient setting by pulmonology  S Ghimire

## 2013-02-24 NOTE — Evaluation (Signed)
Occupational Therapy Evaluation Patient Details Name: Sandra Cuevas MRN: [id number] DOB: Jun 20, 1928 Today's Date: 02/24/2013 Time: 1500-1530 OT Time Calculation (min): 30 min  OT Assessment / Plan / Recommendation History of present illness 79 year old female with history of chronic dyspnea, atypical chest pains with negative stress test in 2012, hx of  asthma, hypertension and diabetes mellitus who presented to the ED with progress shortness of breath for several weeks   Clinical Impression   Pt in bed w/family at bedside. Pt motivated to get out of bed and go for a walk. Pt Mod (I) w/bed mob (bed flat, no rails). Mod (I) w/BSC txfr, (I) hygiene and clothing mgmt. Pt able to don socks and robe w/o physical assist. Amb in hallway w/o AD or rails x193ft w/O2 sats 91-94% on 2L O2. Pt ed re: energy conservation techniques. Verbalized understanding. No further OT indicated. No follow-up OT indicated either. Pt to d/c home w/husband and family support. Pt has all necessary DME at home already.    OT Assessment  Patient does not need any further OT services    Follow Up Recommendations  No OT follow up    Barriers to Discharge      Equipment Recommendations    none   Recommendations for Other Services    Frequency       Precautions / Restrictions Precautions Precautions: Fall Restrictions Weight Bearing Restrictions: No   Pertinent Vitals/Pain denies    ADL  Eating/Feeding: Independent Grooming: Modified independent Upper Body Bathing: Set up;Modified independent Lower Body Bathing: Set up;Modified independent Where Assessed - Lower Body Bathing: Unsupported sitting Upper Body Dressing: Independent;Set up Where Assessed - Upper Body Dressing: Unsupported standing Lower Body Dressing: Modified independent Toilet Transfer: Modified independent Toilet Transfer Method: Sit to stand;Stand pivot Toilet Transfer Equipment: Bedside commode Toileting - Clothing Manipulation and  Hygiene: Independent Where Assessed - Toileting Clothing Manipulation and Hygiene: Sit on 3-in-1 or toilet;Sit to stand from 3-in-1 or toilet Transfers/Ambulation Related to ADLs: pt amb 136ft in hallway w/o AD, O2 91-94% on 2L ADL Comments: inc time, slower pace but no physical assist required    OT Diagnosis:    OT Problem List:   OT Treatment Interventions:     OT Goals(Current goals can be found in the care plan section) Acute Rehab OT Goals Patient Stated Goal: Return home; continue to be as active at home as she can  Visit Information  Assistance Needed: +1 History of Present Illness: 79 year old female with history of chronic dyspnea, atypical chest pains with negative stress test in 2012, hx of  asthma, hypertension and diabetes mellitus who presented to the ED with progress shortness of breath for several weeks       Prior Functioning     Home Living Family/patient expects to be discharged to:: Private residence Living Arrangements: Spouse/significant other Available Help at Discharge: Family Type of Home: House Home Access: Stairs to enter [employer] of Steps: 1- pt states she holds onto cabinet on left Entrance Stairs-Rails: None Home Layout: One level Home Equipment: Cane - single point;Walker - 2 wheels;Bedside commode Prior Function Level of Independence: Independent with assistive device(s) ADL's / Homemaking Assistance Needed: cleaning lady every 2wks. Pt does all meals Comments: pt states she uses cane in the community. At the store she relies on leaning on the cart. Drives to beauty shop Communication Communication: No difficulties         Vision/Perception     Cognition  Cognition Arousal/Alertness: Awake/alert Behavior During Therapy:  WFL for tasks assessed/performed Overall Cognitive Status: Within Functional Limits for tasks assessed    Extremity/Trunk Assessment Upper Extremity Assessment Upper Extremity Assessment: Overall WFL  for tasks assessed Lower Extremity Assessment Lower Extremity Assessment: Defer to PT evaluation Cervical / Trunk Assessment Cervical / Trunk Assessment: Normal     Mobility Bed Mobility Bed Mobility: Supine to Sit Supine to Sit: 7: Independent Details for Bed Mobility Assistance: bed flat, no rails Transfers Transfers: Sit to Stand;Stand to Sit Sit to Stand: 6: Modified independent (Device/Increase time);From toilet;From bed Stand to Sit: 6: Modified independent (Device/Increase time);To chair/3-in-1     Exercise     Balance     End of Session OT - End of Session Equipment Utilized During Treatment: Oxygen Activity Tolerance: Patient tolerated treatment well Patient left: in bed;with family/visitor present  GO     Chinedu Agustin, Deidre Ala 02/24/2013, 3:36 PM

## 2013-02-24 NOTE — Progress Notes (Signed)
PULMONARY  / CRITICAL CARE MEDICINE  Name: Sandra Cuevas MRN: [id number] DOB: [birth year]    ADMISSION DATE:  02/16/2013 CONSULTATION DATE:  02/21/13  REFERRING MD :  [facility] PRIMARY SERVICE: [facility]   CHIEF COMPLAINT:  Consult for Hpercapnia  BRIEF PATIENT DESCRIPTION: 79 y/o admitted 11/11 for acute diastolic failure by [facility]. Pulmonary consult on 11/16 for hypercarbia most likely r/t  chronic asthma.  SUBJECTIVE: States she continues to feel better.  VITAL SIGNS: Temp:  [98.1 F (36.7 C)-98.5 F (36.9 C)] 98.1 F (36.7 C) (11/19 0521) Pulse Rate:  [62-71] 62 (11/19 0521) Resp:  [18-20] 18 (11/19 0521) BP: (124-180)/(69-78) 124/72 mmHg (11/19 0521) SpO2:  [92 %-95 %] 92 % (11/19 0938) Weight:  [201 lb 11.2 oz (91.491 kg)] 201 lb 11.2 oz (91.491 kg) (11/19 0521)  INTAKE / OUTPUT: Intake/Output     11/18 0701 - 11/19 0700 11/19 0701 - 11/20 0700   P.O. 240 240   Total Intake(mL/kg) 240 (2.6) 240 (2.6)   Net +240 +240        Urine Occurrence 5 x 1 x     PHYSICAL EXAMINATION: Gen. Up in bed, comfortable in NAD HEENT: NCAT, PERRL, EOMi PULM: Clear, no wheezing, good air movement CV: RRR, no mgr AB: BS+, soft, nontender Ext: trace edema, warm Neuro: A&Ox4, maew, appropriate  LABS:  CBC No results found for this basename: WBC, HGB, HCT, PLT,  in the last 168 hours  BMET  Recent Labs Lab 02/20/13 0541 02/21/13 0443 02/22/13 0443  NA 142 139 138  K 4.0 3.8 4.2  CL 95* 91* 92*  CO2 38* 43* 40*  BUN 33* 36* 34*  CREATININE 1.00 1.10 0.98  GLUCOSE 117* 149* 181*   Recent Labs Lab 02/21/13 1052 02/21/13 1645  PHART 7.379 7.375  PCO2ART 74.9* 75.3*  PO2ART 67.1* 154.0*   Imaging CXR: 11/16 - Cardiomegaly without CHF or pneumonia . 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   Hypercarbia ? Etiology   Mild hypoxemia due to bronchitis, chronic obstruction P:   -Albuterol/atrovent nebs scheduled qid -Prednisone taper start 11/18, taper to off over 7-10 days -Symbicort initiated (11/17).  Review in follow up in Pulmonary office (arranged).  Discussed oral rinse post use -RT to give [facility] instruction 11/17 -Continue on Levaquin x 5 days (asthmatic bronchitis) -O2 for sat >90%, 91% on 2L at rest - qualifies pt for O2.   Would d/c on 3 L continuously.  -PFT's as outpatient -echo reviewed, no cardiac asthma noted, Pa pressures noted, likely related to underlying pulm dz consier lasix if declines, as slight int prominence noted on pcxr  CARDIOVASCULAR A:   Acute Diastolic Dysfunction > resolving Edema> resolving P:  OK to restart ACE and b-blocker Use ca channel blocker  Low threshold continue lasix  PCCM will be available PRN.  Please call with further concerns.   Canary Brim, NP-C [facility] Pgr: [phone] or [phone]   02/24/2013, 12:15 PM  I have fully examined this patient and agree with above findings.    And edited in full  Mcarthur Rossetti. Tyson Alias, MD, FACP Pgr: [phone] [facility]

## 2013-02-24 NOTE — ED Provider Notes (Signed)
CSN: [id number]     Arrival date & time 02/16/13  2255 History   First MD Initiated Contact with Patient 02/17/13 0024     Chief Complaint  Patient presents with  . Shortness of Breath   (Consider location/radiation/quality/duration/timing/severity/associated sxs/prior Treatment) HPI Comments: 79 year old female with history of chronic dyspnea, atypical chest pains with negative stress test in 2012, hx of  asthma, hypertension and diabetes mellitus who presented to the ED with progress shortness of breath for several weeks. She reports mainly dyspnea on exertion. She reports off and on leg swellings as well. Denies dyspnea at rest, orthopnea or PND. Does report cough with whitish phlegm. Denies wheezing. Denies fever, chills, headache, blurred vision, nausea, vomiting, chest pain, palpitations, abdominal pain, bowel or urinary symptoms. Denies any sick contacts or recent travel.   Patient is a 79 y.o. female presenting with shortness of breath. The history is provided by the patient and medical records.  Shortness of Breath Associated symptoms: chest pain   Associated symptoms: no abdominal pain, no cough, no neck pain, no vomiting and no wheezing     Past Medical History  Diagnosis Date  . Hyperlipidemia   . Hypertension   . Atypical chest pain   . Hypothyroidism   . Diabetes mellitus   . History of cardiovascular stress test 06/01/2010    EF 71% - no evidence of ischemia, normal left ventricular systolic function  . Shortness of breath     on exertion  . History of hysterectomy 1965  . GERD (gastroesophageal reflux disease)   . 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 Cardiac catheterization  2001    EF=65%  . Hemorrhoid surgery    . Abdominal hysterectomy     Family History  Problem Relation Age of Onset  . Heart disease Father     heart problems  . Kidney failure Mother   . Diabetes Mother   . Cancer Brother    . Diabetes Brother    History  Substance Use Topics  . Smoking status: Never Smoker   . Smokeless tobacco: Never Used  . Alcohol Use: No   OB History   Grav Para Term Preterm Abortions TAB SAB Ect Mult Living                 Review of Systems  Constitutional: Positive for activity change.  HENT: Negative for facial swelling.   Respiratory: Positive for shortness of breath. Negative for cough and wheezing.   Cardiovascular: Positive for chest pain.  Gastrointestinal: Negative for nausea, vomiting, abdominal pain, diarrhea, constipation, blood in stool and abdominal distention.  Genitourinary: Negative for hematuria and difficulty urinating.  Musculoskeletal: Negative for neck pain.  Skin: Negative for color change.  Neurological: Negative for speech difficulty.  Hematological: Does not bruise/bleed easily.  Psychiatric/Behavioral: Negative for confusion.    Allergies  Review of patient's allergies indicates no known allergies.  Home Medications  No current outpatient prescriptions on file. BP 124/69  Pulse 62  Temp(Src) 98.5 F (36.9 C) (Oral)  Resp 20  Ht 5\' 6"  (1.676 m)  Wt 206 lb 2.1 oz (93.5 kg)  BMI 33.29 kg/m2  SpO2 95% Physical Exam  Nursing note and vitals reviewed. Constitutional: She is oriented to person, place, and time. She appears well-developed and well-nourished.  HENT:  Head: Normocephalic and atraumatic.  Eyes: EOM are normal. Pupils are equal, round, and reactive to  light.  Neck: Neck supple.  Cardiovascular: Normal rate and regular rhythm.   Murmur heard. Pulmonary/Chest: Effort normal. No respiratory distress.  Abdominal: Soft. She exhibits no distension. There is no tenderness. There is no rebound and no guarding.  Musculoskeletal: She exhibits edema. She exhibits no tenderness.  Neurological: She is alert and oriented to person, place, and time.  Skin: Skin is warm and dry.    ED Course  Procedures (including critical care time) Labs  Review Labs Reviewed  CBC - Abnormal; Notable for the following:    WBC 14.1 (*)    All other components within normal limits  PRO B NATRIURETIC PEPTIDE - Abnormal; Notable for the following:    Pro B Natriuretic peptide (BNP) 5205.0 (*)    All other components within normal limits  HEMOGLOBIN A1C - Abnormal; Notable for the following:    Hemoglobin A1C 6.3 (*)    Mean Plasma Glucose 134 (*)    All other components within normal limits  GLUCOSE, CAPILLARY - Abnormal; Notable for the following:    Glucose-Capillary 132 (*)    All other components within normal limits  GLUCOSE, CAPILLARY - Abnormal; Notable for the following:    Glucose-Capillary 151 (*)    All other components within normal limits  GLUCOSE, CAPILLARY - Abnormal; Notable for the following:    Glucose-Capillary 185 (*)    All other components within normal limits  GLUCOSE, CAPILLARY - Abnormal; Notable for the following:    Glucose-Capillary 145 (*)    All other components within normal limits  GLUCOSE, CAPILLARY - Abnormal; Notable for the following:    Glucose-Capillary 168 (*)    All other components within normal limits  BASIC METABOLIC PANEL - Abnormal; Notable for the following:    Chloride 91 (*)    CO2 39 (*)    Glucose, Bld 136 (*)    BUN 34 (*)    GFR calc non Af Amer 47 (*)    GFR calc Af Amer 54 (*)    All other components within normal limits  GLUCOSE, CAPILLARY - Abnormal; Notable for the following:    Glucose-Capillary 257 (*)    All other components within normal limits  GLUCOSE, CAPILLARY - Abnormal; Notable for the following:    Glucose-Capillary 157 (*)    All other components within normal limits  GLUCOSE, CAPILLARY - Abnormal; Notable for the following:    Glucose-Capillary 125 (*)    All other components within normal limits  GLUCOSE, CAPILLARY - Abnormal; Notable for the following:    Glucose-Capillary 139 (*)    All other components within normal limits  BASIC METABOLIC PANEL -  Abnormal; Notable for the following:    Chloride 94 (*)    CO2 37 (*)    Glucose, Bld 163 (*)    BUN 34 (*)    GFR calc non Af Amer 46 (*)    GFR calc Af Amer 53 (*)    All other components within normal limits  PRO B NATRIURETIC PEPTIDE - Abnormal; Notable for the following:    Pro B Natriuretic peptide (BNP) 713.5 (*)    All other components within normal limits  BASIC METABOLIC PANEL - Abnormal; Notable for the following:    Chloride 95 (*)    CO2 38 (*)    Glucose, Bld 117 (*)    BUN 33 (*)    GFR calc non Af Amer 51 (*)    GFR calc Af Amer 59 (*)  All other components within normal limits  GLUCOSE, CAPILLARY - Abnormal; Notable for the following:    Glucose-Capillary 130 (*)    All other components within normal limits  GLUCOSE, CAPILLARY - Abnormal; Notable for the following:    Glucose-Capillary 130 (*)    All other components within normal limits  GLUCOSE, CAPILLARY - Abnormal; Notable for the following:    Glucose-Capillary 141 (*)    All other components within normal limits  BASIC METABOLIC PANEL - Abnormal; Notable for the following:    Chloride 91 (*)    CO2 43 (*)    Glucose, Bld 149 (*)    BUN 36 (*)    GFR calc non Af Amer 45 (*)    GFR calc Af Amer 53 (*)    All other components within normal limits  GLUCOSE, CAPILLARY - Abnormal; Notable for the following:    Glucose-Capillary 154 (*)    All other components within normal limits  GLUCOSE, CAPILLARY - Abnormal; Notable for the following:    Glucose-Capillary 130 (*)    All other components within normal limits  GLUCOSE, CAPILLARY - Abnormal; Notable for the following:    Glucose-Capillary 116 (*)    All other components within normal limits  BLOOD GAS, ARTERIAL - Abnormal; Notable for the following:    pCO2 arterial 74.9 (*)    pO2, Arterial 67.1 (*)    Bicarbonate 43.2 (*)    Acid-Base Excess 17.1 (*)    All other components within normal limits  GLUCOSE, CAPILLARY - Abnormal; Notable for the  following:    Glucose-Capillary 157 (*)    All other components within normal limits  BLOOD GAS, ARTERIAL - Abnormal; Notable for the following:    pCO2 arterial 75.3 (*)    pO2, Arterial 154.0 (*)    Bicarbonate 43.1 (*)    Acid-Base Excess 17.0 (*)    All other components within normal limits  BASIC METABOLIC PANEL - Abnormal; Notable for the following:    Chloride 92 (*)    CO2 40 (*)    Glucose, Bld 181 (*)    BUN 34 (*)    GFR calc non Af Amer 52 (*)    GFR calc Af Amer 61 (*)    All other components within normal limits  GLUCOSE, CAPILLARY - Abnormal; Notable for the following:    Glucose-Capillary 250 (*)    All other components within normal limits  GLUCOSE, CAPILLARY - Abnormal; Notable for the following:    Glucose-Capillary 160 (*)    All other components within normal limits  GLUCOSE, CAPILLARY - Abnormal; Notable for the following:    Glucose-Capillary 182 (*)    All other components within normal limits  GLUCOSE, CAPILLARY - Abnormal; Notable for the following:    Glucose-Capillary 195 (*)    All other components within normal limits  GLUCOSE, CAPILLARY - Abnormal; Notable for the following:    Glucose-Capillary 225 (*)    All other components within normal limits  GLUCOSE, CAPILLARY - Abnormal; Notable for the following:    Glucose-Capillary 229 (*)    All other components within normal limits  GLUCOSE, CAPILLARY - Abnormal; Notable for the following:    Glucose-Capillary 105 (*)    All other components within normal limits  GLUCOSE, CAPILLARY - Abnormal; Notable for the following:    Glucose-Capillary 141 (*)    All other components within normal limits  GLUCOSE, CAPILLARY - Abnormal; Notable for the following:    Glucose-Capillary 254 (*)  All other components within normal limits  GLUCOSE, CAPILLARY - Abnormal; Notable for the following:    Glucose-Capillary 173 (*)    All other components within normal limits  POCT I-STAT, CHEM 8 - Abnormal; Notable  for the following:    Glucose, Bld 188 (*)    All other components within normal limits  POCT I-STAT TROPONIN I   Imaging Review No results found.  EKG Interpretation    Date/Time:    Ventricular Rate:  89 PR Interval:  156 QRS Duration: 154 QT Interval:  403 QTC Calculation: 490 R Axis:   102 Text Interpretation:  Sinus rhythm Ventricular bigeminy Left atrial enlargement Right bundle branch block Anteroseptal infarct, age indeterminate Lateral leads are also involved ED PHYSICIAN INTERPRETATION AVAILABLE IN [facility]            MDM   1. Hypoxia   2. Shortness of breath   3. Bronchospastic airway disease   4. Type II or unspecified type diabetes mellitus without mention of complication, not stated as uncontrolled   5. Acute diastolic heart failure   6. Hypothyroidism   7. Cough   8. Atypical chest pain   9. Acute bronchiolitis    Pt comes in with cc of DOE. Has extensive cardiac hx. Noted to be hypoxic. Unsure what the cause of her hypoxia is. CT PE shows no PE, no definite infiltrate. Has mild leukocytosis. Lung exam does reveal some wheezing - possible new dx of COPD. Also BNP is elevated - so possible diastolic heart failure  Admission to medicine for optimization and further evaluation.    Derwood Kaplan, MD 02/24/13 [phone]

## 2013-02-24 NOTE — Progress Notes (Addendum)
   SATURATION QUALIFICATIONS: (This note is used to comply with regulatory documentation for home oxygen)  Patient Saturations on Room Air at Rest = 93%  Patient Saturations on Room Air while Ambulating = 84%  Patient Saturations on 1 Liters of oxygen while Ambulating = 90%  Please briefly explain why patient needs home oxygen: pt desats during ambulation

## 2013-02-25 DIAGNOSIS — I10 Essential (primary) hypertension: Secondary | ICD-10-CM

## 2013-02-25 LAB — GLUCOSE, CAPILLARY: Glucose-Capillary: 168 mg/dL — ABNORMAL HIGH (ref 70–99)

## 2013-02-25 MED ORDER — ALBUTEROL SULFATE (5 MG/ML) 0.5% IN NEBU: 2.5000 mg | INHALATION_SOLUTION | RESPIRATORY_TRACT | Status: DC | PRN

## 2013-02-25 MED ORDER — LEVOFLOXACIN 750 MG PO TABS: 750.0000 mg | ORAL_TABLET | ORAL | Status: DC

## 2013-02-25 MED ORDER — PREDNISONE 10 MG PO TABS: ORAL_TABLET | ORAL | Status: DC

## 2013-02-25 MED ORDER — ALBUTEROL SULFATE HFA 108 (90 BASE) MCG/ACT IN AERS: 2.0000 | INHALATION_SPRAY | RESPIRATORY_TRACT | Status: DC | PRN

## 2013-02-25 MED ORDER — ALBUTEROL SULFATE (5 MG/ML) 0.5% IN NEBU: 2.5000 mg | INHALATION_SOLUTION | Freq: Three times a day (TID) | RESPIRATORY_TRACT | Status: DC

## 2013-02-25 MED ORDER — PANTOPRAZOLE SODIUM 40 MG PO TBEC: 40.0000 mg | DELAYED_RELEASE_TABLET | Freq: Every day | ORAL | Status: DC

## 2013-02-25 MED ORDER — BUDESONIDE-FORMOTEROL FUMARATE 160-4.5 MCG/ACT IN AERO: 2.0000 | INHALATION_SPRAY | Freq: Two times a day (BID) | RESPIRATORY_TRACT | Status: DC

## 2013-02-25 MED ORDER — METFORMIN HCL 500 MG PO TABS: 500.0000 mg | ORAL_TABLET | Freq: Two times a day (BID) | ORAL | Status: DC

## 2013-02-25 NOTE — Discharge Summary (Signed)
PATIENT DETAILS Name: Sandra Cuevas Age: 79 y.o. Sex: female Date of Birth: [birth year] MRN: [id number]. Admit Date: 02/16/2013 Admitting Physician: No admitting provider for patient encounter. WUJ:[id number], Chancy Hurter, MD  Recommendations for Outpatient Follow-up:  1. Further pulmonary workup as outpatient-appointment with [facility]ology made 2. Started on the metformin this admission, optimize diabetic control  PRIMARY DISCHARGE DIAGNOSIS:  Principal Problem:   Acute diastolic heart failure Active Problems:   DM   HYPERLIPIDEMIA   HYPERTENSION   Hypothyroidism   Acute hypoxic respiratory failure   Bronchospastic airway disease     PAST MEDICAL HISTORY: Past Medical History  Diagnosis Date  . Hyperlipidemia   . Hypertension   . Atypical chest pain   . Hypothyroidism   . Diabetes mellitus   . History of cardiovascular stress test 06/01/2010    EF 71% - no evidence of ischemia, normal left ventricular systolic function  . Shortness of breath     on exertion  . History of hysterectomy 1965  . GERD (gastroesophageal reflux disease)   . 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     albuterol (5 MG/ML) 0.5% nebulizer solution  Commonly known as:  PROVENTIL  Take 0.5 mLs (2.5 mg total) by nebulization every 4 (four) hours as needed for wheezing or shortness of breath.     albuterol (5 MG/ML) 0.5% nebulizer solution  Commonly known as:  PROVENTIL  Take 0.5 mLs (2.5 mg total) by nebulization 3 (three) times daily.     atorvastatin 10 MG tablet  Commonly known as:   LIPITOR  Take 10 mg by mouth Daily.     budesonide-formoterol 160-4.5 MCG/ACT inhaler  Commonly known as:  SYMBICORT  Inhale 2 puffs into the lungs 2 (two) times daily.     furosemide 40 MG tablet  Commonly known as:  LASIX  Take 40 mg by mouth daily.     levofloxacin 750 MG tablet  Commonly known as:  LEVAQUIN  Take 1 tablet (750 mg total) by mouth every other day.     levothyroxine 50 MCG tablet  Commonly known as:  SYNTHROID, LEVOTHROID  Take 50 mcg by mouth daily.     lisinopril 20 MG tablet  Commonly known as:  PRINIVIL,ZESTRIL  Take 1 tablet (20 mg total) by mouth daily.     metFORMIN 500 MG tablet  Commonly known as:  GLUCOPHAGE  Take 1 tablet (500 mg total) by mouth 2 (two) times daily with a meal.     ONE TOUCH ULTRA TEST test strip  Generic drug:  glucose blood  as directed.     pantoprazole 40 MG tablet  Commonly known as:  PROTONIX  Take 1 tablet (40 mg total) by mouth daily.     polycarbophil 625 MG tablet  Commonly known as:  FIBERCON  Take 1,250 mg by mouth daily.     potassium chloride 10 MEQ CR capsule  Commonly known as:  MICRO-K  Take 10 mEq by mouth daily.     predniSONE 10 MG tablet  Commonly known as:  DELTASONE  - Take 4 tablets (40 mg) daily for 2 days,then  - Take 3 tablets (30 mg) daily for 2 days,then  - Take 2 tablets (20 mg) daily for 2 days,then  - Take 1 tablet (10 mg) daily for one day and then stop        ALLERGIES:  No Known Allergies  BRIEF HPI:  See H&P, Labs, Consult and Test reports for all details in brief, patient is 79 year old female with a history of chronic dyspnea, asthma/COPD, hypertension, diabetes who presented with progressive worsening of shortness of breath.  CONSULTATIONS:   pulmonary/intensive care  PERTINENT RADIOLOGIC STUDIES: Dg Chest 2 View  02/21/2013   CLINICAL DATA:  Cough, shortness of breath  EXAM: CHEST  2 VIEW  COMPARISON:  02/17/2013  FINDINGS: Stable cardiomegaly without CHF. No  pneumonia. Bibasilar atelectasis persist. No enlarging effusion or pneumothorax. Aortic atherosclerosis present. Prior right shoulder arthroplasty changes. Degenerative changes of the spine evident.  IMPRESSION: Cardiomegaly without CHF or pneumonia  Basilar atelectasis  Stable exam   Electronically Signed   By: Ruel Favors M.D.   On: 02/21/2013 13:45   Ct Angio Chest Pe W/cm &/or Wo Cm  02/17/2013   CLINICAL DATA:  Shortness of breath.  EXAM: CT ANGIOGRAPHY CHEST WITH CONTRAST  TECHNIQUE: Multidetector CT imaging of the chest was performed using the standard protocol during bolus administration of intravenous contrast. Multiplanar CT image reconstructions including MIPs were obtained to evaluate the vascular anatomy.  CONTRAST:  OMNIPAQUE IOHEXOL 350 MG/ML SOLN  COMPARISON:  Chest radiograph performed 02/16/2013, and CTA of the chest performed 09/20/2009  FINDINGS: There is no evidence of pulmonary embolus.  Minimal bilateral atelectasis is noted. There is no evidence of significant focal consolidation, pleural effusion or pneumothorax. No masses are identified; no abnormal focal contrast enhancement is seen.  Scattered mildly prominent peribronchial nodes are seen, measuring 0.8 cm on the right and 1.3 cm on the left. No definite mediastinal lymphadenopathy is seen. Diffuse calcification is noted along the thoracic aorta. Scattered calcification is seen along the proximal great vessels. No pericardial effusion is seen. No axillary lymphadenopathy is seen. The visualized portions of the thyroid gland are unremarkable in appearance.  The visualized portions of the liver and spleen are unremarkable.  No acute osseous abnormalities are seen. A right shoulder arthroplasty is unremarkable in appearance.  Review of the MIP images confirms the above findings.  IMPRESSION: 1. No evidence of pulmonary embolus. 2. Minimal bilateral atelectasis; lungs otherwise clear. 3. Scattered mildly prominent peribronchial  nodes, measuring up to 1.3 cm; these are nonspecific in appearance.   Electronically Signed   By: Roanna Raider M.D.   On: 02/17/2013 04:31   Dg Chest Portable 1 View  02/17/2013   CLINICAL DATA:  Shortness of breath, hypertension. History of asthma and bronchitis and pneumonia.  EXAM: PORTABLE CHEST - 1 VIEW  COMPARISON:  Chest radiograph February 19, 2010  FINDINGS: Cardiac silhouette appears moderately enlarged, similar to prior examination. Moderately calcified aortic knob. Similar mild pulmonary hyper expansion, chronic interstitial changes with biapical pleural parenchymal scarring. Improved aeration of the lingula. 6 mm nodular density projects in left lung base. No pneumothorax.  Status post right shoulder arthroplasty. Multiple EKG lines overlie the patient and may obscure subtle underlying pathology.  IMPRESSION: Moderate cardiomegaly and emphysematous changes. 6 mm nodular density an left lung base could reflect  pulmonary on vessel en face or true parenchymal nodule. If clinically indicated, this could be further characterized on CT, if not performed recommend close attention on followup imaging.   Electronically Signed   By: Awilda Metro   On: 02/17/2013 00:23     PERTINENT LAB RESULTS: CBC: No results found for this basename: WBC, HGB, HCT, PLT,  in the last 72 hours CMET CMP     Component Value Date/Time   NA 138 02/22/2013 0443   K 4.2 02/22/2013 0443   CL 92* 02/22/2013 0443   CO2 40* 02/22/2013 0443   GLUCOSE 181* 02/22/2013 0443   BUN 34* 02/22/2013 0443   CREATININE 0.98 02/22/2013 0443   CALCIUM 9.3 02/22/2013 0443   PROT 6.1 12/27/2009 2114   ALBUMIN 3.4* 12/27/2009 2114   AST 18 12/27/2009 2114   ALT 12 12/27/2009 2114   ALKPHOS 65 12/27/2009 2114   BILITOT 0.2* 12/27/2009 2114   GFRNONAA 52* 02/22/2013 0443   GFRAA 61* 02/22/2013 0443    GFR Estimated Creatinine Clearance: 50.4 ml/min (by C-G formula based on Cr of 0.98). No results found for this basename:  LIPASE, AMYLASE,  in the last 72 hours No results found for this basename: CKTOTAL, CKMB, CKMBINDEX, TROPONINI,  in the last 72 hours No components found with this basename: POCBNP,  No results found for this basename: DDIMER,  in the last 72 hours No results found for this basename: HGBA1C,  in the last 72 hours No results found for this basename: CHOL, HDL, LDLCALC, TRIG, CHOLHDL, LDLDIRECT,  in the last 72 hours No results found for this basename: TSH, T4TOTAL, FREET3, T3FREE, THYROIDAB,  in the last 72 hours No results found for this basename: VITAMINB12, FOLATE, FERRITIN, TIBC, IRON, RETICCTPCT,  in the last 72 hours Coags: No results found for this basename: PT, INR,  in the last 72 hours Microbiology: No results found for this or any previous visit (from the past 240 hour(s)).   BRIEF HOSPITAL COURSE:   Principal Problem: Acute respiratory failure - Initially this was attributed to acute decompensation of diastolic heart failure. Patient was admitted, started on Lasix. Subsequently improved. CT angiogram of the chest during this hospitalization was negative for pulmonary embolism. However, during hospitalization, she developed hypercarbia and needed to be transferred down to the step down unit and required BiPAP. Hypocarbia was presumed secondary to undiagnosed COPD with exacerbation.  Hypercarbic respiratory failure-acute - As noted above, during the hospital course, patient developed acute hypercarbic respiratory failure, was started on steroids, nebulized bronchodilators and required temporary BiPAP support. With these above measures, she improved. Pulmonology was consulted. 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 With the above measures, patient improved, currently she still requires 2-3 L of oxygen to maintain O2 saturations. It is felt that the patient will require home O2  on discharge. Further workup including pulmonary function tests will be done in the outpatient setting at [facility] pulmonology.  Acute diastolic heart failure (grede 2 via TTE) - Admission she was given intravenous Lasix, and other supportive care. Her discharge she is clinically compensated, she will be continued on Lasix.  DM  CBGs stable on steroids- but on Lantus and SSI during inpatient, we will transition to metformin on discharge.A1c of 6.3  HYPERTENSION - Continue with lisinopril 20 mg daily on discharge  Hypothyroidism  On home replacement dose  TODAY-DAY OF DISCHARGE:  Subjective:   Jazmin Guallpa today has no headache,no chest abdominal pain,no new weakness tingling or numbness, feels much better wants to go home today.   Objective:   Blood pressure 108/65, pulse 58, temperature 98.5 F (36.9 C), temperature source Oral, resp. rate 18, height 5\' 6"  (1.676 m), weight 91.218 kg (201 lb 1.6 oz), SpO2 94.00%.  Intake/Output Summary (Last 24 hours) at 02/25/13 1137 Last data filed at 02/25/13 0900  Gross per 24 hour  Intake    360 ml  Output   1300 ml  Net   -940 ml   Filed Weights   02/23/13 0456 02/24/13 0521 02/25/13 0442  Weight: 93.5 kg (206 lb 2.1 oz) 91.491 kg (201 lb 11.2 oz) 91.218 kg (201 lb 1.6 oz)    Exam Awake Alert, Oriented *3, No new F.N deficits, Normal affect [place].AT,PERRAL Supple Neck,No JVD, No cervical lymphadenopathy appriciated.  Symmetrical Chest wall movement, Good air movement bilaterally, CTAB RRR,No Gallops,Rubs or new Murmurs, No Parasternal Heave +ve B.Sounds, Abd Soft, Non tender, No organomegaly appriciated, No rebound -guarding or rigidity. No Cyanosis, Clubbing or edema, No new Rash or bruise  DISCHARGE CONDITION: Stable  DISPOSITION: Home  DISCHARGE INSTRUCTIONS:    Activity:  As tolerated with Full fall precautions use walker/cane & assistance as needed  Diet recommendation: Diabetic Diet Heart Healthy diet        Discharge Orders   Future Appointments Provider Department Dept Phone   03/09/2013 3:00 PM Julio Sicks, NP [facility] [phone]   03/23/2013 2:00 PM Vesta Mixer, MD [facility] Office [phone]   Future Orders Complete By Expires   (HEART FAILURE PATIENTS) Call MD:  Anytime you have any of the following symptoms: 1) 3 pound weight gain in 24 hours or 5 pounds in 1 week 2) shortness of breath, with or without a dry hacking cough 3) swelling in the hands, feet or stomach 4) if you have to sleep on extra pillows at night in order to breathe.  As directed    Call MD for:  difficulty breathing, headache or visual disturbances  As directed    Diet - low sodium heart healthy  As directed    Diet Carb Modified  As directed    Increase activity slowly  As directed       Follow-up Information   Follow up with PARRETT,TAMMY, NP On 03/09/2013. (Appt at 3 PM)    Specialty:  Nurse Practitioner   Contact information:   [address] [place] [address] [phone]       Follow up with Colette Ribas, MD. Schedule an appointment as soon as possible for a visit in 1 week.   Specialty:  Family Medicine   Contact information:   [address] [place] [address] [phone]        Total Time spent on discharge equals 45 minutes.  SignedJeoffrey Massed 02/25/2013 11:37 AM

## 2013-02-25 NOTE — Progress Notes (Signed)
Reviewed discharge information with patient and family members.  Information included review of CHF symptoms, dietary restrictions, nebulizer usage, activity restrictions, medication and changes in medication, and follow up appointments with MD.  Comprehension of material validated with use of "teach-back" method.  Pt received portable oxygen instructions with family present and was discharged to home with family via private vehicle.  Escorted to exit via wheelchair by volunteer services.

## 2013-03-03 ENCOUNTER — Other Ambulatory Visit (HOSPITAL_COMMUNITY): Payer: Self-pay | Admitting: Family Medicine

## 2013-03-03 DIAGNOSIS — M542 Cervicalgia: Secondary | ICD-10-CM

## 2013-03-08 ENCOUNTER — Ambulatory Visit (HOSPITAL_COMMUNITY)
Admission: RE | Admit: 2013-03-08 | Discharge: 2013-03-08 | Disposition: A | Discharge status: Home / Self Care | Payer: Medicare Other | Source: Ambulatory Visit | Attending: Family Medicine | Admitting: Family Medicine

## 2013-03-08 DIAGNOSIS — M542 Cervicalgia: Secondary | ICD-10-CM | POA: Insufficient documentation

## 2013-03-08 DIAGNOSIS — M4802 Spinal stenosis, cervical region: Secondary | ICD-10-CM | POA: Insufficient documentation

## 2013-03-08 DIAGNOSIS — M47812 Spondylosis without myelopathy or radiculopathy, cervical region: Secondary | ICD-10-CM | POA: Insufficient documentation

## 2013-03-08 DIAGNOSIS — M5020 Other cervical disc displacement, unspecified cervical region: Secondary | ICD-10-CM | POA: Insufficient documentation

## 2013-03-08 DIAGNOSIS — M6281 Muscle weakness (generalized): Secondary | ICD-10-CM | POA: Insufficient documentation

## 2013-03-09 ENCOUNTER — Encounter: Payer: Self-pay | Admitting: Adult Health

## 2013-03-09 ENCOUNTER — Ambulatory Visit (INDEPENDENT_AMBULATORY_CARE_PROVIDER_SITE_OTHER): Payer: Medicare Other | Admitting: Adult Health

## 2013-03-09 VITALS — BP 126/64 | HR 81 | Temp 97.3°F | Ht 66.0 in | Wt 207.4 lb

## 2013-03-09 DIAGNOSIS — R05 Cough: Secondary | ICD-10-CM

## 2013-03-09 DIAGNOSIS — R0689 Other abnormalities of breathing: Secondary | ICD-10-CM

## 2013-03-09 DIAGNOSIS — R0609 Other forms of dyspnea: Secondary | ICD-10-CM

## 2013-03-09 DIAGNOSIS — I5031 Acute diastolic (congestive) heart failure: Secondary | ICD-10-CM

## 2013-03-09 DIAGNOSIS — J9809 Other diseases of bronchus, not elsewhere classified: Secondary | ICD-10-CM

## 2013-03-09 DIAGNOSIS — J398 Other specified diseases of upper respiratory tract: Secondary | ICD-10-CM

## 2013-03-09 DIAGNOSIS — R059 Cough, unspecified: Secondary | ICD-10-CM

## 2013-03-09 DIAGNOSIS — R0902 Hypoxemia: Secondary | ICD-10-CM

## 2013-03-09 NOTE — Patient Instructions (Addendum)
Continue on Symbicort 2 puffs Twice daily , rinse after use.  Continue on oxygen 2 L daily. We are setting you up for a sleep study. Follow up in 3-4 weeks with pulmonary function test Dr. Shelle Iron  Please contact office for sooner follow up if symptoms do not improve or worsen or seek emergency care    Late add :  STOP LISINOPRIL  BEGIN DIOVAN 80MG  daily

## 2013-03-10 NOTE — Progress Notes (Signed)
Subjective:    Patient ID: Sandra Cuevas, female    DOB: 09-13-1928, 79 y.o.   MRN: [id number]  HPI 69 yowf never smoker   October 18, 2009 1st pulmonary office eval cc ongoing cp better but still congested and can't lie down without loosing breath and very hoarse 50 ft as far as can walk. mucus is still yellow. no lateralizing pleuritic cp or classic anginal cp. sinuses feel congested also. >>Last visit tx w/ Augmentin and prednisone. ACE stopped.   October 23, 2009--Presents for follow up and med review. Seen last visit w/ persistent cough, tx w/ abx ,and steroids. ACE stopped to help w/ persistent cough. Returns for [place] to review her meds.    03/09/13 Post Hospital follow up  Reports breathing is doing well; no new complaints. Admitted 11/11-11/20 for acute resp failure due to  acute decompensation of diastolic heart failure. Patient was admitted, started on Lasix. Subsequently improved. CT angiogram of the chest during this hospitalization was negative for pulmonary embolism. However, during hospitalization, she developed hypercarbia and needed to be transferred down to the step down unit and required BiPAP. Hypercarbia (PCO2 75) was presumed secondary to presumed  undiagnosed chronic asthma .  Although she is a never smoker . Started on steroids, nebulized bronchodilators and required temporary BiPAP support. Pulmonology was consulted. 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 Required o2 at discharge.  Discharged on symbicort . Also discharged on ACE inhibitor.  Pt was seen at pulmonary clinic in past with ACE related cough.  Echo on 11/13 showed EF 60-65%, grade 2 diastolic dysfunction. RA mod dilated. PAP 54 .  Does complain of daytime sleepiness.  Says she is a lot better since discharge , cough and dyspnea are better.  Has PT at home .  Seen 2011 by Dr. Sherene Sires  For ACE cough. Cough did  improve off ACE.    Review of Systems Constitutional:   No  weight loss, night sweats,  Fevers, chills, + fatigue, or  lassitude.  HEENT:   No headaches,  Difficulty swallowing,  Tooth/dental problems, or  Sore throat,                No sneezing, itching, ear ache,  +nasal congestion, post nasal drip,   CV:  No chest pain,  Orthopnea, PND, swelling in lower extremities, anasarca, dizziness, palpitations, syncope.   GI  No heartburn, indigestion, abdominal pain, nausea, vomiting, diarrhea, change in bowel habits, loss of appetite, bloody stools.   Resp:    No coughing up of blood.  No change in color of mucus.  No wheezing.  No chest wall deformity  Skin: no rash or lesions.  GU: no dysuria, change in color of urine, no urgency or frequency.  No flank pain, no hematuria   MS:  No joint pain or swelling.  No decreased range of motion.  No back pain.  Psych:  No change in mood or affect. No depression or anxiety.  No memory loss.         Objective:   Physical Exam GEN: A/Ox3; pleasant , NAD, elderly   HEENT:  [place]/AT,  EACs-clear, TMs-wnl, NOSE-clear, THROAT-clear, no lesions, no postnasal drip or exudate noted.   NECK:  Supple w/ fair ROM; no JVD; normal carotid impulses w/o bruits; no thyromegaly or nodules palpated; no lymphadenopathy.  RESP  Diminshed BS in bases ,  no accessory muscle use, no dullness to percussion  CARD:  RRR, no m/r/g  , no peripheral edema, pulses intact, no cyanosis or clubbing.  GI:   Soft & nt; nml bowel sounds; no organomegaly or masses detected.  Musco: Warm bil, no deformities or joint swelling noted.   Neuro: alert, no focal deficits noted.    Skin: Warm, no lesions or rashes         Assessment & Plan:

## 2013-03-12 DIAGNOSIS — R0689 Other abnormalities of breathing: Secondary | ICD-10-CM | POA: Insufficient documentation

## 2013-03-12 NOTE — Assessment & Plan Note (Signed)
None smoker, check PFT on return  Cont on symbicort  Stop ACE , change to ARB

## 2013-03-12 NOTE — Assessment & Plan Note (Signed)
Recent decompensated Diastolic Heart Failure now improved  Echo does show grade 2 DD. PAP elevated ? Related to chronic lung dz-asthma Cont on current regimen  Would change ACE to ARB -avoid ACE in future -has follow up with cardiology .

## 2013-03-12 NOTE — Assessment & Plan Note (Addendum)
Hypercarbic RF with PCO2 ~75 during admit ? R/t chronic asthma vs  +/- OSA /OHS with daytime sleepiness Set up sleep study, check PFT on return  Not on chronic narcs.

## 2013-03-12 NOTE — Assessment & Plan Note (Addendum)
Hx of Ace cough .  Will stop ACE  Check PFT on return  Begin diovan 80mg  daily

## 2013-03-18 ENCOUNTER — Ambulatory Visit: Payer: Medicare Other | Attending: Adult Health | Admitting: Sleep Medicine

## 2013-03-18 DIAGNOSIS — R0902 Hypoxemia: Secondary | ICD-10-CM

## 2013-03-18 DIAGNOSIS — G4710 Hypersomnia, unspecified: Secondary | ICD-10-CM | POA: Insufficient documentation

## 2013-03-18 DIAGNOSIS — J9809 Other diseases of bronchus, not elsewhere classified: Secondary | ICD-10-CM

## 2013-03-19 NOTE — Sleep Study (Signed)
Split night protocol not met AHI < 15 in 1st 2 hrs TST

## 2013-03-23 ENCOUNTER — Ambulatory Visit (INDEPENDENT_AMBULATORY_CARE_PROVIDER_SITE_OTHER): Payer: Medicare Other | Admitting: Cardiovascular Disease

## 2013-03-23 ENCOUNTER — Encounter: Payer: Self-pay | Admitting: Cardiovascular Disease

## 2013-03-23 VITALS — BP 122/70 | HR 93 | Ht 66.0 in | Wt 205.0 lb

## 2013-03-23 DIAGNOSIS — I4949 Other premature depolarization: Secondary | ICD-10-CM

## 2013-03-23 DIAGNOSIS — E785 Hyperlipidemia, unspecified: Secondary | ICD-10-CM

## 2013-03-23 DIAGNOSIS — I10 Essential (primary) hypertension: Secondary | ICD-10-CM

## 2013-03-23 DIAGNOSIS — I493 Ventricular premature depolarization: Secondary | ICD-10-CM | POA: Insufficient documentation

## 2013-03-23 NOTE — Assessment & Plan Note (Signed)
She's having lots of premature ventricular contractions on her EKG today. She's completely asymptomatic.  She's having to use her inhalers quite a bit. I hesitate to start a beta blocker at this time since she is completely asymptomatic.  Her last potassium level was normal.  If she does have an increase in her palpitations, we'll consider starting her on bisoprolol which hopefully will not worsen her bronchospasm.  I'll see her again in one year.

## 2013-03-23 NOTE — Patient Instructions (Signed)
Your physician wants you to follow-up in: 1 year  You will receive a reminder letter in the mail two months in advance. If you don't receive a letter, please call our office to schedule the follow-up appointment.  Your physician recommends that you continue on your current medications as directed. Please refer to the Current Medication list given to you today.  

## 2013-03-23 NOTE — Progress Notes (Signed)
Sandra Cuevas Date of Birth  [birth year]       [facility]    [place] [address]  [address] [place], [place]  [address]   [place], [place]  [address] [phone]     [phone]   Fax  [phone]    Fax [phone]  Problem List: 1. Chest pain-normal stress Myoview study 2. Dyspnea 3. Hypertension 4. Diabetes Mellitus 5. Carotid bruit IMPRESSION: Less than 50% stenosis in the right internal carotid artery with some plaque.  Stable 50-69% stenosis in the left internal carotid artery with significant calcified plaque.  Increasing velocity in the left external carotid artery compatible with significant narrowing.  6. RBBB  History of Present Illness: Sandra Cuevas is an 79 year old female with a history of atypical chest pains. She had a negative stress Myoview study in March of 2012. She has a history of asthma and uses albuterol on an as-needed basis. She continues to have shortness of breath with exertion. He also has a history of hypertension.  She complains of chronic dyspnea.   She also has lots of back pain.  Dec. 16, 2014:  Sandra Cuevas is doing well from a cardiac standpoint. She's having lots of noncardiac problems. She's having problems with her bowels. She has lots of neck pain. She had an MRI performed several weeks ago which reveals significant cervical arthritis and disc disease. Her blood pressure has been normal.  She also has lots of asthma. She has used her inhalers on a regular basis.   Current Outpatient Prescriptions on File Prior to Visit  Medication Sig Dispense Refill  . albuterol (PROVENTIL HFA;VENTOLIN HFA) 108 (90 BASE) MCG/ACT inhaler Inhale 2 puffs into the lungs every 4 (four) hours as needed for wheezing.  1 Inhaler  12  . albuterol (PROVENTIL) (5 MG/ML) 0.5% nebulizer solution Take 0.5 mLs (2.5 mg total) by nebulization every 4 (four) hours as needed for wheezing or shortness of breath.  20 mL   12  . atorvastatin (LIPITOR) 10 MG tablet Take 10 mg by mouth Daily.       . budesonide-formoterol (SYMBICORT) 160-4.5 MCG/ACT inhaler Inhale 2 puffs into the lungs 2 (two) times daily.  1 Inhaler  12  . furosemide (LASIX) 40 MG tablet Take 40 mg by mouth daily.      Marland Kitchen levothyroxine (SYNTHROID, LEVOTHROID) 50 MCG tablet Take 50 mcg by mouth daily.        Marland Kitchen lisinopril (PRINIVIL,ZESTRIL) 20 MG tablet Take 1 tablet (20 mg total) by mouth daily.  90 tablet  0  . metFORMIN (GLUCOPHAGE) 500 MG tablet Take 1 tablet (500 mg total) by mouth 2 (two) times daily with a meal.  60 tablet  0  . ONE TOUCH ULTRA TEST test strip as directed.      . pantoprazole (PROTONIX) 40 MG tablet Take 1 tablet (40 mg total) by mouth daily.  30 tablet  0  . polycarbophil (FIBERCON) 625 MG tablet Take 1,250 mg by mouth daily.       . potassium chloride (MICRO-K) 10 MEQ CR capsule Take 10 mEq by mouth daily.        No current facility-administered medications on file prior to visit.    No Known Allergies  Past Medical History  Diagnosis Date  . Hyperlipidemia   . Hypertension   . Atypical chest pain   . Hypothyroidism   . Diabetes mellitus   . History of cardiovascular stress test  06/01/2010    EF 71% - no evidence of ischemia, normal left ventricular systolic function  . Shortness of breath     on exertion  . History of hysterectomy 1965  . GERD (gastroesophageal reflux disease)   . 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 Cardiac catheterization  2001    EF=65%  . Hemorrhoid surgery    . Abdominal hysterectomy      History  Smoking status  . Never Smoker   Smokeless tobacco  . Never Used    History  Alcohol Use No    Family History  Problem Relation Age of Onset  . Heart disease Father     heart problems  . Kidney failure Mother   . Diabetes Mother   . Cancer Brother   . Diabetes Brother     Reviw of Systems:  Reviewed in  the HPI.  All other systems are negative.  Physical Exam: Blood pressure 122/70, pulse 93, height 5\' 6"  (1.676 m), weight 205 lb (92.987 kg). General: Well developed, well nourished, in no acute distress.  Head: Normocephalic, atraumatic, sclera non-icteric, mucus membranes are moist,   Neck: Supple. Carotids are 2 + without bruits. No JVD  Lungs: Clear bilaterally to auscultation.  Heart: regular rate.  normal  S1 S2. No murmurs, gallops or rubs.  Abdomen: Soft, non-tender, non-distended with normal bowel sounds. No hepatomegaly. No rebound/guarding. No masses.  Msk:  Strength and tone are normal  Extremities: No clubbing or cyanosis. No edema.  Distal pedal pulses are 2+ and equal bilaterally.  Neuro: Alert and oriented X 3. Moves all extremities spontaneously.  Psych:  Responds to questions appropriately with a normal affect.  ECG: Dec. 16, 2014:  NSR with frequent PVCs.   Assessment / Plan:

## 2013-03-23 NOTE — Assessment & Plan Note (Signed)
Her blood pressure is well-controlled. Continue with her same medications. 

## 2013-04-09 DIAGNOSIS — R0902 Hypoxemia: Secondary | ICD-10-CM

## 2013-04-09 DIAGNOSIS — J988 Other specified respiratory disorders: Secondary | ICD-10-CM

## 2013-04-09 DIAGNOSIS — J398 Other specified diseases of upper respiratory tract: Secondary | ICD-10-CM

## 2013-04-09 NOTE — Procedures (Signed)
NAMEJODECI, RINI                 ACCOUNT NO.:  [id number]  MEDICAL RECORD NO.:  [id number]          PATIENT TYPE:  OUT  LOCATION:  SLEEP LAB                     FACILITY:  [facility]  PHYSICIAN:  Kathee Delton, MD,FCCPDATE OF BIRTH:  10/08/1928  DATE OF STUDY:  03/18/2013                           NOCTURNAL POLYSOMNOGRAM  REFERRING PHYSICIAN:  Rexene Edison, NP  INDICATION FOR STUDY:  Hypersomnia with sleep apnea.  EPWORTH SCORE:  3.  EPWORTH SLEEPINESS SCORE:  MEDICATIONS:  SLEEP ARCHITECTURE:  The patient had a total sleep time of 308 minutes with adequate slow-wave sleep for age, but decreased REM and only 58 minutes.  Sleep onset latency was normal at 14 minutes and REM onset was normal at 83 minutes.  Sleep efficiency was mildly reduced at 83%.  RESPIRATORY DATA:  The patient was found to have 11 apneas and no hypopneas, giving her an apnea-hypopnea index of only 2 events per hour. Her events occurred primarily during REM, and there was loud snoring noted throughout.  The patient did not meet split night protocol secondary to her small numbers of events.  OXYGEN DATA:  There was O2 desaturation as low as 92% during the night. It should be noted her study was done on 2 L/minute of oxygen throughout the night.  CARDIAC DATA:  Frequent PVCs noted throughout, as well as intermittent wide-complex rhythm that appeared to be sinus.  MOVEMENTS/PARASOMNIA:  The patient was found to have large numbers of leg jerks, but there was very little sleep disruption noted.  There were no abnormal behaviors seen.  IMPRESSION/RECOMMENDATION: 1. Small numbers of obstructive events which do not meet the AHI     criteria for the obstructive sleep apnea syndrome. 2. Frequent PVCs noted, as well as an intermittent wide-complex rhythm     that appeared to be sinus in origin.      Kathee Delton, MD,FCCP [place], [place] Board of Sleep Medicine    [facility]/MEDQ  D:  04/09/2013 09:23:35   T:  04/09/2013 12:06:34  Job:  [id number]

## 2013-04-19 ENCOUNTER — Encounter: Payer: Self-pay | Admitting: Adult Health

## 2013-04-20 ENCOUNTER — Ambulatory Visit (INDEPENDENT_AMBULATORY_CARE_PROVIDER_SITE_OTHER): Payer: Medicare Other | Admitting: Pulmonary Disease

## 2013-04-20 ENCOUNTER — Encounter: Payer: Self-pay | Admitting: Pulmonary Disease

## 2013-04-20 VITALS — BP 148/68 | HR 89 | Temp 97.9°F | Ht 66.0 in | Wt 207.8 lb

## 2013-04-20 DIAGNOSIS — J398 Other specified diseases of upper respiratory tract: Secondary | ICD-10-CM

## 2013-04-20 DIAGNOSIS — R0609 Other forms of dyspnea: Secondary | ICD-10-CM

## 2013-04-20 DIAGNOSIS — R0689 Other abnormalities of breathing: Secondary | ICD-10-CM

## 2013-04-20 DIAGNOSIS — R0989 Other specified symptoms and signs involving the circulatory and respiratory systems: Secondary | ICD-10-CM

## 2013-04-20 DIAGNOSIS — R0683 Snoring: Secondary | ICD-10-CM | POA: Insufficient documentation

## 2013-04-20 DIAGNOSIS — J988 Other specified respiratory disorders: Secondary | ICD-10-CM

## 2013-04-20 DIAGNOSIS — J4489 Other specified chronic obstructive pulmonary disease: Secondary | ICD-10-CM | POA: Insufficient documentation

## 2013-04-20 DIAGNOSIS — J449 Chronic obstructive pulmonary disease, unspecified: Secondary | ICD-10-CM

## 2013-04-20 DIAGNOSIS — J9809 Other diseases of bronchus, not elsewhere classified: Secondary | ICD-10-CM

## 2013-04-20 LAB — PULMONARY FUNCTION TEST
DL/VA % pred: 78 %
DL/VA: 3.89 ml/min/mmHg/L
DLCO UNC % PRED: 67 %
DLCO unc: 17.82 ml/min/mmHg
FEF 25-75 Post: 0.65 L/sec
FEF 25-75 Pre: 0.72 L/sec
FEF2575-%Change-Post: -9 %
FEF2575-%PRED-POST: 48 %
FEF2575-%Pred-Pre: 53 %
FEV1-%CHANGE-POST: -3 %
FEV1-%PRED-POST: 72 %
FEV1-%Pred-Pre: 75 %
FEV1-POST: 1.45 L
FEV1-Pre: 1.51 L
FEV1FVC-%CHANGE-POST: 0 %
FEV1FVC-%PRED-PRE: 85 %
FEV6-%Change-Post: -3 %
FEV6-%PRED-PRE: 93 %
FEV6-%Pred-Post: 90 %
FEV6-POST: 2.29 L
FEV6-Pre: 2.36 L
FEV6FVC-%CHANGE-POST: 0 %
FEV6FVC-%PRED-PRE: 102 %
FEV6FVC-%Pred-Post: 103 %
FVC-%Change-Post: -3 %
FVC-%Pred-Post: 87 %
FVC-%Pred-Pre: 90 %
FVC-PRE: 2.41 L
FVC-Post: 2.33 L
PRE FEV6/FVC RATIO: 98 %
Post FEV1/FVC ratio: 62 %
Post FEV6/FVC ratio: 98 %
Pre FEV1/FVC ratio: 62 %

## 2013-04-20 NOTE — Patient Instructions (Signed)
Will check oxygen level today with exertion, and will order an overnight study at home to see if we can stop oxygen completely Stay on symbicort, but can look to see if alternative are less expensive.  Includes advair 250/50, dulera 100/5, breo .   Work on weight loss and conditioning. Monitor fluid balance closely, and let cardiologist know if weight is going up daily.  followup with me again in 49mos.

## 2013-04-20 NOTE — Assessment & Plan Note (Signed)
The pt did not have sleep apnea on her recent NPSG.  Unlikely this or OHS the cause of her recent hypercarbia.

## 2013-04-20 NOTE — Progress Notes (Signed)
PFT done today. 

## 2013-04-20 NOTE — Assessment & Plan Note (Signed)
The patient has had recent acute hypercarbia while in the hospital that may have been related to an acute exacerbation of her asthma in the face of acute diastolic heart failure. She has not morbidly obese, nor does her sleep study suggest sleep apnea or hypoventilation.

## 2013-04-20 NOTE — Assessment & Plan Note (Signed)
The patient has moderate airflow obstruction on her recent PFTs, and given the fact that she has never smoked and has a history of childhood asthma, this is most consistent with chronic obstructive asthma. She has been doing well on Symbicort since her discharge from the hospital, and I have stressed to her the importance of staying on this medication. I have reviewed with her the concept of airway inflammation, and how it leads to airway remodeling over the years. We will continue her on a maintenance regimen in order to prevent progressive airflow obstruction, and also to help with her symptoms.  We'll work on getting her off oxygen since she seems to have improved significantly.

## 2013-04-20 NOTE — Progress Notes (Signed)
   Subjective:    Patient ID: Sandra Cuevas, female    DOB: 22-May-1928, 80 y.o.   MRN: [id number]  HPI The patient is an 80 year old female who I've been asked to see for possible obstructive sleep apnea, and also asthma. She was in her usual state of health until the fall of last year, when she began to notice slowly progressive shortness of breath. Associated with this was progressive overall body edema. She was ultimately admitted to the hospital the beginning of December with decompensated diastolic heart failure, and later developed hypercarbia to a level of 75 and required bilevel. It was unclear whether this was related to her heart failure, COPD, or possibly sleep apnea/OHS. She was treated with a maintenance regimen of Symbicort and as needed albuterol, and referred to me for further evaluation. She has had a sleep study for evaluation of snoring and to rule out sleep apnea or hypoventilation. This showed an AHI of 2 events per hour, and oxygen desaturation as low as 92%. It should be noted the study was done on 2 L throughout the night. She has had pulmonary function studies that showed moderate airflow obstruction and mild restriction with an abnormal diffusion capacity. The patient was discharged on oxygen, but currently is only wearing during sleep. She feels that she is doing much better, and has seen significant improvement in her exertional tolerance. She denies any significant cough, and only produces mild early morning mucus. She has not had any increased lower extremity edema.   Review of Systems  Constitutional: Negative for fever and unexpected weight change.  HENT: Negative for congestion, dental problem, ear pain, nosebleeds, postnasal drip, rhinorrhea, sinus pressure, sneezing, sore throat and trouble swallowing.   Eyes: Negative for redness and itching.  Respiratory: Negative for cough, chest tightness, shortness of breath and wheezing.   Cardiovascular: Negative for palpitations  and leg swelling.  Gastrointestinal: Negative for nausea and vomiting.  Genitourinary: Negative for dysuria.  Musculoskeletal: Negative for joint swelling.  Skin: Negative for rash.  Neurological: Negative for headaches.  Hematological: Does not bruise/bleed easily.  Psychiatric/Behavioral: Negative for dysphoric mood. The patient is not nervous/anxious.        Objective:   Physical Exam Constitutional:  Overweight female, no acute distress  HENT:  Nares patent without discharge  Oropharynx without exudate, palate and uvula are normal  Eyes:  Perrla, eomi, no scleral icterus  Neck:  No JVD, no TMG  Cardiovascular:  Normal rate, regular rhythm, no rubs or gallops.  No murmurs        Intact distal pulses  Pulmonary :  Normal breath sounds, no stridor or respiratory distress   No rales, rhonchi, or wheezing  Abdominal:  Soft, nondistended, bowel sounds present.  No tenderness noted.   Musculoskeletal:  Mild lower extremity edema noted.  Lymph Nodes:  No cervical lymphadenopathy noted  Skin:  No cyanosis noted  Neurologic:  Alert, appropriate, moves all 4 extremities without obvious deficit.         Assessment & Plan:

## 2013-05-07 ENCOUNTER — Ambulatory Visit: Payer: Medicare Other | Admitting: Neurology

## 2013-05-12 ENCOUNTER — Encounter: Payer: Self-pay | Admitting: Neurology

## 2013-05-12 ENCOUNTER — Ambulatory Visit (INDEPENDENT_AMBULATORY_CARE_PROVIDER_SITE_OTHER): Payer: Medicare Other | Admitting: Neurology

## 2013-05-12 VITALS — BP 118/68 | HR 64 | Temp 97.8°F | Ht 66.0 in | Wt 206.8 lb

## 2013-05-12 DIAGNOSIS — R937 Abnormal findings on diagnostic imaging of other parts of musculoskeletal system: Secondary | ICD-10-CM

## 2013-05-12 DIAGNOSIS — G9589 Other specified diseases of spinal cord: Secondary | ICD-10-CM

## 2013-05-12 NOTE — Progress Notes (Signed)
NEUROLOGY CONSULTATION NOTE  Sandra Cuevas MRN: [id number] DOB: 06/08/1928  Referring provider: Dr. Hilma Favors Primary care provider: Dr. Hilma Favors  Reason for consult:  Abnormal MRI.   HISTORY OF PRESENT ILLNESS: Sandra Cuevas is an 80 year old right-handed woman with history of hypertension, COPD, hypothyroidism, diabetes mellitus, hyperlipidemia, asthma and excessive daytime sleepiness who presents for evaluation of MS.  Records and images were personally reviewed where available.    She had developed a localized neck pain that was exacerbated with neck movement and causing headache.  She denied pain or numbness down the extremities.  For neck pain, she had an MRI of the cervical spine performed on 03/08/13, which revealed degenerative cervical spondylosis and disc disease and foraminal stenosis, as well as patchy cord signal.  The report mentioned that MS was a possibility.  She does not have a history of MS.  In retrospect, she denies any usual episodes with numbness, unilateral weakness, or transient vision loss or eye pain.  There is no family history of MS.  She has a bad back, but otherwise feels well.  The neck pain is resolved. Marland Kitchen  PAST MEDICAL HISTORY: Past Medical History  Diagnosis Date  . Hyperlipidemia   . Hypertension   . Atypical chest pain   . Hypothyroidism   . Diabetes mellitus   . History of cardiovascular stress test 06/01/2010    EF 71% - no evidence of ischemia, normal left ventricular systolic function  . Shortness of breath     on exertion  . History of hysterectomy 1965  . GERD (gastroesophageal reflux disease)   . 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 Cardiac catheterization  2001    EF=65%  . Hemorrhoid surgery    . Abdominal hysterectomy      MEDICATIONS: Current Outpatient Prescriptions on File Prior to Visit  Medication Sig Dispense Refill  . albuterol  (PROVENTIL HFA;VENTOLIN HFA) 108 (90 BASE) MCG/ACT inhaler Inhale 2 puffs into the lungs every 4 (four) hours as needed for wheezing.  1 Inhaler  12  . albuterol (PROVENTIL) (5 MG/ML) 0.5% nebulizer solution Take 0.5 mLs (2.5 mg total) by nebulization every 4 (four) hours as needed for wheezing or shortness of breath.  20 mL  12  . atorvastatin (LIPITOR) 10 MG tablet Take 10 mg by mouth Daily.       . budesonide-formoterol (SYMBICORT) 160-4.5 MCG/ACT inhaler Inhale 2 puffs into the lungs 2 (two) times daily.  1 Inhaler  12  . furosemide (LASIX) 40 MG tablet Take 40 mg by mouth daily.      Marland Kitchen levothyroxine (SYNTHROID, LEVOTHROID) 50 MCG tablet Take 50 mcg by mouth daily.        Marland Kitchen lisinopril (PRINIVIL,ZESTRIL) 20 MG tablet Take 1 tablet (20 mg total) by mouth daily.  90 tablet  0  . metFORMIN (GLUCOPHAGE) 500 MG tablet Take 1 tablet (500 mg total) by mouth 2 (two) times daily with a meal.  60 tablet  0  . ONE TOUCH ULTRA TEST test strip as directed.      . pantoprazole (PROTONIX) 40 MG tablet Take 1 tablet (40 mg total) by mouth daily.  30 tablet  0  . polycarbophil (FIBERCON) 625 MG tablet Take 1,250 mg by mouth daily.       . potassium chloride (MICRO-K) 10 MEQ CR capsule Take 10 mEq by mouth daily.  No current facility-administered medications on file prior to visit.    ALLERGIES: No Known Allergies  FAMILY HISTORY: Family History  Problem Relation Age of Onset  . Heart disease Father     heart problems  . Kidney failure Mother   . Diabetes Mother   . Cancer Brother   . Diabetes Brother     SOCIAL HISTORY: History   Social History  . Marital Status: Married    Spouse Name: N/A    Number of Children: N/A  . Years of Education: N/A   Occupational History  . Not on file.   Social History Main Topics  . Smoking status: Never Smoker   . Smokeless tobacco: Never Used  . Alcohol Use: No  . Drug Use: No  . Sexual Activity: No   Other Topics Concern  . Not on file    Social History Narrative  . No narrative on file    REVIEW OF SYSTEMS: Constitutional: No fevers, chills, or sweats, no generalized fatigue, change in appetite Eyes: No visual changes, double vision, eye pain Ear, nose and throat: No hearing loss, ear pain, nasal congestion, sore throat Cardiovascular: No chest pain, palpitations Respiratory:  No shortness of breath at rest or with exertion, wheezes GastrointestinaI: No nausea, vomiting, diarrhea, abdominal pain, fecal incontinence Genitourinary:  No dysuria, urinary retention or frequency Musculoskeletal:  No neck pain, back pain Integumentary: No rash, pruritus, skin lesions Neurological: as above Psychiatric: No depression, insomnia, anxiety Endocrine: No palpitations, fatigue, diaphoresis, mood swings, change in appetite, change in weight, increased thirst Hematologic/Lymphatic:  No anemia, purpura, petechiae. Allergic/Immunologic: no itchy/runny eyes, nasal congestion, recent allergic reactions, rashes  PHYSICAL EXAM: Filed Vitals:   05/12/13 1417  BP: 118/68  Pulse: 64  Temp: 97.8 F (36.6 C)   General: No acute distress Head:  Normocephalic/atraumatic Neck: supple, no paraspinal tenderness, full range of motion Back: No paraspinal tenderness Heart: regular rate and rhythm Lungs: Clear to auscultation bilaterally. Vascular: No carotid bruits. Neurological Exam: Mental status: alert and oriented to person, place, and time, speech fluent and not dysarthric, language intact. Cranial nerves: CN I: not tested CN II: pupils equal, round and reactive to light, visual fields intact, fundi unremarkable. CN III, IV, VI:  full range of motion, no nystagmus, no ptosis CN V: facial sensation intact CN VII: upper and lower face symmetric CN VIII: hearing intact CN IX, X: gag intact, uvula midline CN XI: sternocleidomastoid and trapezius muscles intact CN XII: tongue midline Bulk & Tone: normal, no fasciculations. Motor:  5/5 throughout Sensation: temperature and vibration intact. Deep Tendon Reflexes: 1+ throughout, toes down Finger to nose testing: no dysmetria Gait: flexed posture, mildly wide-based, no ataxia. Romberg negative.  IMPRESSION: Abnormal Cervical MRI.  Most likely myelomalacia, perhaps from a remote injury.  I doubt MS.  PLAN: I wouldn't pursue any further testing.  She is asymptomatic, so I don't think further testing would change management.  Even if she had incidentally newly-diagnosed MS, we wouldn't initiate therapy anyway, since she is doing well.  No further follow up necessary unless needed.  45 minutes spent with patient and her daughter, over 50% spent counseling and coordinating care.  Thank you for allowing me to take part in the care of this patient.  Metta Clines, DO  CC:  Sharilyn Sites, MD

## 2013-05-12 NOTE — Patient Instructions (Signed)
I don't think you have multiple sclerosis.  The most important thing is that you are doing well.  No need for follow up.  Call with questions or concerns.

## 2013-05-20 ENCOUNTER — Other Ambulatory Visit: Payer: Self-pay

## 2013-05-20 DIAGNOSIS — I10 Essential (primary) hypertension: Secondary | ICD-10-CM

## 2013-05-20 MED ORDER — LISINOPRIL 20 MG PO TABS: 20.0000 mg | ORAL_TABLET | Freq: Every day | ORAL | Status: DC

## 2013-05-27 ENCOUNTER — Telehealth: Payer: Self-pay | Admitting: Pulmonary Disease

## 2013-05-27 DIAGNOSIS — R0902 Hypoxemia: Secondary | ICD-10-CM

## 2013-05-27 NOTE — Telephone Encounter (Signed)
I called and spoke with Sandra Cuevas. She reports she has not been using her O2. Sandra Cuevas reports she had ONO about 1 month ago and has not heard anything. Please advise [place] if you have results? Thanks

## 2013-05-28 NOTE — Telephone Encounter (Signed)
I spoke with Jeneen Rinks at [facility]. They are going to refax the ONO report to [phone]. Will route to [place] to follow up on.

## 2013-05-28 NOTE — Telephone Encounter (Signed)
I have never received the results of the ONO.

## 2013-06-02 ENCOUNTER — Telehealth: Payer: Self-pay | Admitting: Pulmonary Disease

## 2013-06-02 NOTE — Telephone Encounter (Signed)
ONO results have been received and placed in [facility] Errin Chewning folder for review. Please advise, thanks.   

## 2013-06-02 NOTE — Telephone Encounter (Signed)
I called and spoke with pt. Duplicate message see phone note 05/27/13

## 2013-06-02 NOTE — Telephone Encounter (Signed)
ONO results received and given to [place], KC, pls advise.  Thank you.

## 2013-06-02 NOTE — Telephone Encounter (Signed)
Haven't seen it 

## 2013-06-02 NOTE — Telephone Encounter (Signed)
Called [facility], spoke with [employer].  She will refax ONO results to triage. Will await fax.

## 2013-06-02 NOTE — Telephone Encounter (Signed)
Pt is calling requesting the results of ONO. She wants her O2 picked up. Please advise [place] thanks

## 2013-06-04 ENCOUNTER — Encounter: Payer: Self-pay | Admitting: Pulmonary Disease

## 2013-06-04 NOTE — Telephone Encounter (Signed)
Let pt know that her oxygen level did drop to 81% for a very short time, but only spent 3 min the entire night less than 88%.  Therefore, ok to d/c oxygen and send order.  Thanks.

## 2013-06-04 NOTE — Telephone Encounter (Signed)
I spoke with patient about results and she verbalized understanding and had no questions 

## 2013-07-01 ENCOUNTER — Encounter: Payer: Self-pay | Admitting: Pulmonary Disease

## 2013-08-09 ENCOUNTER — Encounter: Payer: Self-pay | Admitting: Pulmonary Disease

## 2013-08-18 ENCOUNTER — Ambulatory Visit: Payer: Medicare Other | Admitting: Pulmonary Disease

## 2013-09-02 ENCOUNTER — Other Ambulatory Visit (HOSPITAL_COMMUNITY): Payer: Self-pay | Admitting: Family Medicine

## 2013-09-02 DIAGNOSIS — Z1231 Encounter for screening mammogram for malignant neoplasm of breast: Secondary | ICD-10-CM

## 2013-09-07 ENCOUNTER — Ambulatory Visit (HOSPITAL_COMMUNITY)
Admission: RE | Admit: 2013-09-07 | Discharge: 2013-09-07 | Disposition: A | Discharge status: Home / Self Care | Payer: Medicare Other | Source: Ambulatory Visit | Attending: Family Medicine | Admitting: Family Medicine

## 2013-09-07 DIAGNOSIS — Z1231 Encounter for screening mammogram for malignant neoplasm of breast: Secondary | ICD-10-CM | POA: Insufficient documentation

## 2013-09-29 ENCOUNTER — Encounter (INDEPENDENT_AMBULATORY_CARE_PROVIDER_SITE_OTHER): Payer: Medicare Other | Admitting: Ophthalmology

## 2013-09-29 DIAGNOSIS — H353290 Exudative age-related macular degeneration, unspecified eye, stage unspecified: Secondary | ICD-10-CM

## 2013-09-29 DIAGNOSIS — H35039 Hypertensive retinopathy, unspecified eye: Secondary | ICD-10-CM

## 2013-09-29 DIAGNOSIS — H3530 Unspecified macular degeneration: Secondary | ICD-10-CM

## 2013-09-29 DIAGNOSIS — H43819 Vitreous degeneration, unspecified eye: Secondary | ICD-10-CM

## 2013-09-29 DIAGNOSIS — I10 Essential (primary) hypertension: Secondary | ICD-10-CM

## 2013-09-29 DIAGNOSIS — H2510 Age-related nuclear cataract, unspecified eye: Secondary | ICD-10-CM

## 2013-10-05 ENCOUNTER — Encounter: Payer: Self-pay | Admitting: Pulmonary Disease

## 2013-10-05 ENCOUNTER — Ambulatory Visit (INDEPENDENT_AMBULATORY_CARE_PROVIDER_SITE_OTHER): Payer: Medicare Other | Admitting: Pulmonary Disease

## 2013-10-05 VITALS — BP 120/80 | HR 81 | Temp 97.6°F | Ht 66.0 in | Wt 207.0 lb

## 2013-10-05 DIAGNOSIS — J209 Acute bronchitis, unspecified: Secondary | ICD-10-CM

## 2013-10-05 DIAGNOSIS — J4489 Other specified chronic obstructive pulmonary disease: Secondary | ICD-10-CM

## 2013-10-05 DIAGNOSIS — J449 Chronic obstructive pulmonary disease, unspecified: Secondary | ICD-10-CM

## 2013-10-05 MED ORDER — CEFDINIR 300 MG PO CAPS: ORAL_CAPSULE | ORAL | Status: DC

## 2013-10-05 NOTE — Assessment & Plan Note (Signed)
The patient feels that her breathing is doing well on her current regimen, and is not having increased shortness of breath currently.

## 2013-10-05 NOTE — Patient Instructions (Signed)
Will treat with omnicef 300mg , take 2 each am for 5 days. Take mucinex dm according to the directions on box. Continue on symbicort twice a day. followup with me at our apptm already scheduled, but call if your cough is not improving.

## 2013-10-05 NOTE — Addendum Note (Signed)
Addended by: Maurice March on: 10/05/2013 09:50 AM   Modules accepted: Orders

## 2013-10-05 NOTE — Progress Notes (Signed)
   Subjective:    Patient ID: Sandra Cuevas, female    DOB: [birth year], 80 y.o.   MRN: [id number]  HPI The patient comes in today for an acute sick visit. She has known chronic obstructive asthma, and gives a one-week history of increasing chest congestion with thick white mucus. She denies any definite purulence, but her cough has been increasing. She does have cough paroxysms at times, and notes a tickling sensation in her throat. Of note, she is on an ACE inhibitor. Despite all of this, she denies any increased shortness of breath as long as she has not coughing, and feels that Symbicort has helped her breathing.   Review of Systems  Constitutional: Negative for fever and unexpected weight change.  HENT: Negative for congestion, dental problem, ear pain, nosebleeds, postnasal drip, rhinorrhea, sinus pressure, sneezing, sore throat and trouble swallowing.   Eyes: Negative for redness and itching.  Respiratory: Positive for cough, chest tightness and shortness of breath. Negative for wheezing.   Cardiovascular: Negative for palpitations and leg swelling.  Gastrointestinal: Negative for nausea and vomiting.  Genitourinary: Negative for dysuria.  Musculoskeletal: Negative for joint swelling.  Skin: Negative for rash.  Neurological: Negative for headaches.  Hematological: Does not bruise/bleed easily.  Psychiatric/Behavioral: Negative for dysphoric mood. The patient is not nervous/anxious.        Objective:   Physical Exam Obese female in no acute distress Nose without purulence or discharge noted Neck without lymphadenopathy or thyromegaly Chest with mildly decreased breath sounds, adequate airflow, no wheezing Cardiac exam with regular rate and rhythm, 2/6 systolic murmur Lower extremities with mild ankle edema, no cyanosis Alert and oriented, moves all 4 extremities.       Assessment & Plan:

## 2013-10-05 NOTE — Assessment & Plan Note (Signed)
The patient is having increased quantity of thick white mucus, and does feel congested. Will treat this as an episode of acute bronchitis and see if things improve. She is also on an ACE inhibitor, and I wonder if this is causing her cough did be propagated. If she continues to cough after the antibiotic, the ACE inhibitor will need to be discontinued.

## 2013-10-09 ENCOUNTER — Emergency Department (HOSPITAL_COMMUNITY)
Admission: EM | Admit: 2013-10-09 | Discharge: 2013-10-09 | Disposition: A | Discharge status: Home / Self Care | Payer: Medicare Other | Attending: Emergency Medicine | Admitting: Emergency Medicine

## 2013-10-09 ENCOUNTER — Emergency Department (HOSPITAL_COMMUNITY): Payer: Medicare Other

## 2013-10-09 ENCOUNTER — Encounter (HOSPITAL_COMMUNITY): Payer: Self-pay | Admitting: Emergency Medicine

## 2013-10-09 DIAGNOSIS — J441 Chronic obstructive pulmonary disease with (acute) exacerbation: Secondary | ICD-10-CM | POA: Insufficient documentation

## 2013-10-09 DIAGNOSIS — E039 Hypothyroidism, unspecified: Secondary | ICD-10-CM | POA: Insufficient documentation

## 2013-10-09 DIAGNOSIS — R609 Edema, unspecified: Secondary | ICD-10-CM | POA: Insufficient documentation

## 2013-10-09 DIAGNOSIS — Z79899 Other long term (current) drug therapy: Secondary | ICD-10-CM | POA: Insufficient documentation

## 2013-10-09 DIAGNOSIS — J449 Chronic obstructive pulmonary disease, unspecified: Secondary | ICD-10-CM

## 2013-10-09 DIAGNOSIS — K219 Gastro-esophageal reflux disease without esophagitis: Secondary | ICD-10-CM | POA: Insufficient documentation

## 2013-10-09 DIAGNOSIS — IMO0002 Reserved for concepts with insufficient information to code with codable children: Secondary | ICD-10-CM | POA: Insufficient documentation

## 2013-10-09 DIAGNOSIS — E119 Type 2 diabetes mellitus without complications: Secondary | ICD-10-CM | POA: Insufficient documentation

## 2013-10-09 DIAGNOSIS — I10 Essential (primary) hypertension: Secondary | ICD-10-CM | POA: Insufficient documentation

## 2013-10-09 DIAGNOSIS — Z8540 Personal history of malignant neoplasm of unspecified female genital organ: Secondary | ICD-10-CM | POA: Insufficient documentation

## 2013-10-09 DIAGNOSIS — Z9889 Other specified postprocedural states: Secondary | ICD-10-CM | POA: Insufficient documentation

## 2013-10-09 DIAGNOSIS — J45901 Unspecified asthma with (acute) exacerbation: Principal | ICD-10-CM

## 2013-10-09 DIAGNOSIS — J40 Bronchitis, not specified as acute or chronic: Secondary | ICD-10-CM

## 2013-10-09 DIAGNOSIS — E785 Hyperlipidemia, unspecified: Secondary | ICD-10-CM | POA: Insufficient documentation

## 2013-10-09 MED ORDER — IPRATROPIUM-ALBUTEROL 0.5-2.5 (3) MG/3ML IN SOLN: 3.0000 mL | RESPIRATORY_TRACT | Status: DC

## 2013-10-09 MED ORDER — ALBUTEROL SULFATE (2.5 MG/3ML) 0.083% IN NEBU
5.0000 mg | INHALATION_SOLUTION | Freq: Once | RESPIRATORY_TRACT | Status: AC
  Administered 2013-10-09: 2.5 mg via RESPIRATORY_TRACT
  Filled 2013-10-09: qty 6

## 2013-10-09 MED ORDER — PREDNISONE 20 MG PO TABS: 40.0000 mg | ORAL_TABLET | Freq: Every day | ORAL | Status: DC

## 2013-10-09 MED ORDER — IPRATROPIUM-ALBUTEROL 0.5-2.5 (3) MG/3ML IN SOLN
3.0000 mL | Freq: Once | RESPIRATORY_TRACT | Status: AC
  Administered 2013-10-09: 3 mL via RESPIRATORY_TRACT
  Filled 2013-10-09: qty 3

## 2013-10-09 MED ORDER — IPRATROPIUM BROMIDE 0.02 % IN SOLN: 0.5000 mg | Freq: Once | RESPIRATORY_TRACT | Status: DC

## 2013-10-09 MED ORDER — PREDNISONE 50 MG PO TABS
60.0000 mg | ORAL_TABLET | Freq: Once | ORAL | Status: AC
  Administered 2013-10-09: 60 mg via ORAL
  Filled 2013-10-09 (×2): qty 1

## 2013-10-09 NOTE — ED Provider Notes (Signed)
CSN: [id number]     Arrival date & time 10/09/13  0830 History  This chart was scribed for Virgel Manifold, MD by Martinique Peace, ED Scribe. The patient was seen in [place]. The patient's care was started at 9:45 AM.    Chief Complaint  Patient presents with  . Cough      Patient is a 80 y.o. female presenting with cough. The history is provided by the patient and a relative. No language interpreter was used.  Cough Cough characteristics:  Productive Severity:  Severe Duration:  1 week Progression:  Worsening Smoker: no   Ineffective treatments: Albuterol Inhaler. Associated symptoms: shortness of breath   Associated symptoms: no chills and no fever   HPI Comments: Sandra Cuevas is a 80 y.o. female who presents to the Emergency Department complaining of severe productive cough onset 1 week with associated congestion and SOB. She states that she was seen on Tuesday (10/05/2013), by Dr. Braulio Conte about the same problem but states it has worsened since then. Pt reports history of similar occurrence last year in November that caused her to be hospitalized for 9 days. She states that she has been using her Albuterol inhaler without relief. She denies any swelling, fever, or chills. Her SpO2 is 90%.   Past Medical History  Diagnosis Date  . Hyperlipidemia   . Hypertension   . Atypical chest pain   . Hypothyroidism   . Diabetes mellitus   . History of cardiovascular stress test 06/01/2010    EF 71% - no evidence of ischemia, normal left ventricular systolic function  . Shortness of breath     on exertion  . History of hysterectomy 1965  . GERD (gastroesophageal reflux disease)   . 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 Cardiac catheterization  2001    EF=65%  . Hemorrhoid surgery    . Abdominal hysterectomy     Family History  Problem Relation Age of Onset  . Heart disease Father     heart problems  . Kidney failure  Mother   . Diabetes Mother   . Cancer Brother   . Diabetes Brother    History  Substance Use Topics  . Smoking status: Never Smoker   . Smokeless tobacco: Never Used  . Alcohol Use: No   OB History   Grav Para Term Preterm Abortions TAB SAB Ect Mult Living                 Review of Systems  Constitutional: Negative for fever and chills.  HENT: Positive for congestion (thick).   Respiratory: Positive for cough (productive) and shortness of breath.   All other systems reviewed and are negative.     Allergies  Review of patient's allergies indicates no known allergies.  Home Medications   Prior to Admission medications   Medication Sig Start Date End Date Taking? Authorizing Provider  albuterol (PROVENTIL HFA;VENTOLIN HFA) 108 (90 BASE) MCG/ACT inhaler Inhale 2 puffs into the lungs every 4 (four) hours as needed for wheezing. 02/25/13 08/23/15  Shanker Kristeen Mans, MD  albuterol (PROVENTIL) (5 MG/ML) 0.5% nebulizer solution Take 0.5 mLs (2.5 mg total) by nebulization every 4 (four) hours as needed for wheezing or shortness of breath. 02/25/13   Shanker Kristeen Mans, MD  atorvastatin (LIPITOR) 10 MG tablet Take 10 mg by mouth Daily.  07/13/10   Historical Provider, MD  budesonide-formoterol (SYMBICORT) 160-4.5  MCG/ACT inhaler Inhale 2 puffs into the lungs 2 (two) times daily. 02/25/13   Shanker Kristeen Mans, MD  cefdinir (OMNICEF) 300 MG capsule take 2 each am for 5 days. 10/05/13   Kathee Delton, MD  furosemide (LASIX) 40 MG tablet Take 40 mg by mouth daily.    Historical Provider, MD  levothyroxine (SYNTHROID, LEVOTHROID) 50 MCG tablet Take 50 mcg by mouth daily.      Historical Provider, MD  lisinopril (PRINIVIL,ZESTRIL) 20 MG tablet Take 1 tablet (20 mg total) by mouth daily. 05/20/13 05/20/14  Josue Hector, MD  metFORMIN (GLUCOPHAGE) 500 MG tablet Take 1 tablet (500 mg total) by mouth 2 (two) times daily with a meal. 02/25/13   Jonetta Osgood, MD  omeprazole (PRILOSEC) 20 MG capsule  Take 1 capsule by mouth daily. 08/05/13   Historical Provider, MD  ONE TOUCH ULTRA TEST test strip as directed. 05/14/10   Historical Provider, MD  pantoprazole (PROTONIX) 40 MG tablet Take 1 tablet (40 mg total) by mouth daily. 02/25/13   Shanker Kristeen Mans, MD  polycarbophil (FIBERCON) 625 MG tablet Take 1,250 mg by mouth daily.     Historical Provider, MD  potassium chloride (MICRO-K) 10 MEQ CR capsule Take 10 mEq by mouth daily.  08/15/10   Historical Provider, MD   Triage Vitals: BP 147/82  Pulse 98  Temp(Src) 98.8 F (37.1 C) (Oral)  Resp 20  Ht 5\' 6"  (1.676 m)  Wt 205 lb (92.987 kg)  BMI 33.10 kg/m2  SpO2 90% Physical Exam  Nursing note and vitals reviewed. Constitutional: She is oriented to person, place, and time. She appears well-developed and well-nourished. No distress.  HENT:  Head: Normocephalic and atraumatic.  Eyes: Conjunctivae and EOM are normal.  Neck: Neck supple. No tracheal deviation present.  Cardiovascular: Normal rate.   Pulmonary/Chest: Effort normal. No respiratory distress. She has wheezes (bilaterally).  Frequent cough.   Musculoskeletal: Normal range of motion. She exhibits edema. She exhibits no tenderness.  Trace of LE edema. No calf tenderness.   Neurological: She is alert and oriented to person, place, and time.  Skin: Skin is warm and dry. She is not diaphoretic.  Psychiatric: She has a normal mood and affect. Her behavior is normal.    ED Course  Procedures (including critical care time) DIAGNOSTIC STUDIES: Oxygen Saturation is 90% on room air, low by my interpretation.    COORDINATION OF CARE: 9:50 AM- Treatment plan was discussed with patient who verbalizes understanding and agrees.     Labs Review Labs Reviewed - No data to display  Imaging Review Dg Chest 2 View  10/09/2013   CLINICAL DATA:  Cough, congestion  EXAM: CHEST  2 VIEW  COMPARISON:  02/21/2013  FINDINGS: The lungs are hyperinflated likely secondary to COPD. There is no focal  parenchymal opacity, pleural effusion, or pneumothorax. The heart and mediastinal contours are unremarkable. There is thoracic aortic atherosclerosis.  Right shoulder arthroplasty. There is mild thoracic spine spondylosis.  IMPRESSION: No active cardiopulmonary disease.   Electronically Signed   By: Kathreen Devoid   On: 10/09/2013 09:38     EKG Interpretation None     Medications  albuterol (PROVENTIL) (2.5 MG/3ML) 0.083% nebulizer solution 5 mg (not administered)  ipratropium-albuterol (DUONEB) 0.5-2.5 (3) MG/3ML nebulizer solution 3 mL (not administered)  predniSONE (DELTASONE) tablet 60 mg (60 mg Oral Given 10/09/13 1004)    MDM   Final diagnoses:  Chronic obstructive asthma, unspecified  Bronchitis    85yF with  frequent cough. Some wheezing on exam, but no significant increase in WOB. Suspect bronchitis. Sounds and reports feeling better after neb. Continued steroids. Return rpecautions discussed.   I personally preformed the services scribed in my presence. The recorded information has been reviewed is accurate. Virgel Manifold, MD.    Virgel Manifold, MD 10/19/13 [phone]

## 2013-10-09 NOTE — Discharge Instructions (Signed)
Bronchitis  Bronchitis is inflammation of the airways that extend from the windpipe into the lungs (bronchi). The inflammation often causes mucus to develop, which leads to a cough. If the inflammation becomes severe, it may cause shortness of breath.  CAUSES   Bronchitis may be caused by:    Viral infections.    Bacteria.    Cigarette smoke.    Allergens, pollutants, and other irritants.   SIGNS AND SYMPTOMS   The most common symptom of bronchitis is a frequent cough that produces mucus. Other symptoms include:   Fever.    Body aches.    Chest congestion.    Chills.    Shortness of breath.    Sore throat.   DIAGNOSIS   Bronchitis is usually diagnosed through a medical history and physical exam. Tests, such as chest X-rays, are sometimes done to rule out other conditions.   TREATMENT   You may need to avoid contact with whatever caused the problem (smoking, for example). Medicines are sometimes needed. These may include:   Antibiotics. These may be prescribed if the condition is caused by bacteria.   Cough suppressants. These may be prescribed for relief of cough symptoms.    Inhaled medicines. These may be prescribed to help open your airways and make it easier for you to breathe.    Steroid medicines. These may be prescribed for those with recurrent (chronic) bronchitis.  HOME CARE INSTRUCTIONS   Get plenty of rest.    Drink enough fluids to keep your urine clear or pale yellow (unless you have a medical condition that requires fluid restriction). Increasing fluids may help thin your secretions and will prevent dehydration.    Only take over-the-counter or prescription medicines as directed by your health care provider.   Only take antibiotics as directed. Make sure you finish them even if you start to feel better.   Avoid secondhand smoke, irritating chemicals, and strong fumes. These will make bronchitis worse. If you are a smoker, quit smoking. Consider using nicotine gum or  skin patches to help control withdrawal symptoms. Quitting smoking will help your lungs heal faster.    Put a cool-mist humidifier in your bedroom at night to moisten the air. This may help loosen mucus. Change the water in the humidifier daily. You can also run the hot water in your shower and sit in the bathroom with the door closed for 5-10 minutes.    Follow up with your health care provider as directed.    Wash your hands frequently to avoid catching bronchitis again or spreading an infection to others.   SEEK MEDICAL CARE IF:  Your symptoms do not improve after 1 week of treatment.   SEEK IMMEDIATE MEDICAL CARE IF:   Your fever increases.   You have chills.    You have chest pain.    You have worsening shortness of breath.    You have bloody sputum.   You faint.   You have lightheadedness.   You have a severe headache.    You vomit repeatedly.  MAKE SURE YOU:    Understand these instructions.   Will watch your condition.   Will get help right away if you are not doing well or get worse.  Document Released: 03/25/2005 Document Revised: 01/13/2013 Document Reviewed: 11/17/2012  Exit[employer] Patient Information 2015 [employer]. This information is not intended to replace advice given to you by your health care provider. Make sure you discuss any questions you have with your health care   provider.

## 2013-10-09 NOTE — ED Notes (Signed)
Couth and congestion x 1 week. Seen by PCP on 6/30 and put on omnicef and mucinex with no improvement. RA o2 sats 90%

## 2013-10-29 ENCOUNTER — Other Ambulatory Visit: Payer: Self-pay

## 2013-10-29 MED ORDER — LISINOPRIL 20 MG PO TABS: 20.0000 mg | ORAL_TABLET | Freq: Every day | ORAL | Status: DC

## 2013-11-10 ENCOUNTER — Ambulatory Visit (INDEPENDENT_AMBULATORY_CARE_PROVIDER_SITE_OTHER): Payer: Medicare Other | Admitting: Pulmonary Disease

## 2013-11-10 ENCOUNTER — Encounter: Payer: Self-pay | Admitting: Pulmonary Disease

## 2013-11-10 VITALS — BP 130/82 | HR 85 | Temp 98.0°F | Ht 66.0 in | Wt 198.0 lb

## 2013-11-10 DIAGNOSIS — R05 Cough: Secondary | ICD-10-CM

## 2013-11-10 DIAGNOSIS — J449 Chronic obstructive pulmonary disease, unspecified: Secondary | ICD-10-CM

## 2013-11-10 DIAGNOSIS — R059 Cough, unspecified: Secondary | ICD-10-CM

## 2013-11-10 NOTE — Assessment & Plan Note (Signed)
The patient is having persistent cough with throat fullness and severe hoarseness despite multiple rounds of antibiotics and prednisone from her primary care physician. I think this is related to her ACE inhibitor, and this will need to be discontinued. If she continues to have issues, she will need and upper airway evaluation by otolaryngology.

## 2013-11-10 NOTE — Assessment & Plan Note (Signed)
The patient's lungs are totally clear to auscultation today, with no evidence for bronchospasm. She has significant hoarseness, and is having issues with a fullness in her throat. She has been on multiple rounds of antibiotics and prednisone with no improvement in her symptoms. I suspect this is coming from her ACE inhibitor, and I have asked her to discontinue her lisinopril today. She is to call her primary physician for a substitute, and I will send my note from today to them. She is to continue on her current bronchodilator maintenance regimen which seems to be doing a fairly good job at controlling her asthma.

## 2013-11-10 NOTE — Progress Notes (Signed)
   Subjective:    Patient ID: Sandra Cuevas, female    DOB: 1928-06-11, 80 y.o.   MRN: [id number]  HPI The patient comes in today for an acute sick visit. She has known chronic obstructive asthma, and was last seen the end of June with what sounds like an episode of acute bronchitis. She was treated with antibiotics and prednisone, but continued to have persistent symptoms. She has apparently been treated with multiple rounds of antibiotics and prednisone by her primary care physician, but continues to have cough and hoarseness. The patient is on an ACE inhibitor, and we have discussed at the last visit discontinuing this if her symptoms did not improve. She is now having a severe globus sensation, as well as a cough with very small quantities of stringy mucus. She has significant hoarseness and feels that air movement through her throat is sometimes difficult.   Review of Systems  Constitutional: Negative for fever and unexpected weight change.  HENT: Negative for congestion, dental problem, ear pain, nosebleeds, postnasal drip, rhinorrhea, sinus pressure, sneezing, sore throat and trouble swallowing.   Eyes: Negative for redness and itching.  Respiratory: Positive for cough, chest tightness, shortness of breath and wheezing.   Cardiovascular: Negative for palpitations and leg swelling.  Gastrointestinal: Negative for nausea and vomiting.  Genitourinary: Negative for dysuria.  Musculoskeletal: Negative for joint swelling.  Skin: Negative for rash.  Neurological: Negative for headaches.  Hematological: Does not bruise/bleed easily.  Psychiatric/Behavioral: Negative for dysphoric mood. The patient is not nervous/anxious.        Objective:   Physical Exam Well-developed female in no acute distress. Nose without purulence or discharge noted Oropharynx clear Neck without lymphadenopathy or thyromegaly. The patient is extremely hoarse with upper airway noise but no stridor Chest totally clear  to auscultation with no wheezing Cardiac exam with regular rate and rhythm Lower extremities with mild edema, no cyanosis Alert and oriented, moves all 4 extremities.       Assessment & Plan:

## 2013-11-10 NOTE — Patient Instructions (Signed)
You need to stop lisinopril now.  Please call your primary doctor for a substitute TODAY, and I will send him a note as well.  If your symptoms do not improve, I agree you will need to have an ENT doctor take a look at your voice box.  Keep your usual followup apptm with me.  No change in your usual asthma medication.

## 2013-12-02 ENCOUNTER — Ambulatory Visit (INDEPENDENT_AMBULATORY_CARE_PROVIDER_SITE_OTHER): Payer: Medicare Other | Admitting: Otolaryngology

## 2013-12-02 DIAGNOSIS — R490 Dysphonia: Secondary | ICD-10-CM

## 2013-12-02 DIAGNOSIS — K219 Gastro-esophageal reflux disease without esophagitis: Secondary | ICD-10-CM

## 2013-12-08 ENCOUNTER — Encounter (INDEPENDENT_AMBULATORY_CARE_PROVIDER_SITE_OTHER): Payer: Medicare Other | Admitting: Ophthalmology

## 2013-12-27 ENCOUNTER — Encounter (INDEPENDENT_AMBULATORY_CARE_PROVIDER_SITE_OTHER): Payer: Medicare Other | Admitting: Ophthalmology

## 2013-12-27 DIAGNOSIS — H3530 Unspecified macular degeneration: Secondary | ICD-10-CM

## 2013-12-27 DIAGNOSIS — H35039 Hypertensive retinopathy, unspecified eye: Secondary | ICD-10-CM

## 2013-12-27 DIAGNOSIS — I10 Essential (primary) hypertension: Secondary | ICD-10-CM

## 2013-12-27 DIAGNOSIS — H43819 Vitreous degeneration, unspecified eye: Secondary | ICD-10-CM

## 2013-12-30 ENCOUNTER — Ambulatory Visit (INDEPENDENT_AMBULATORY_CARE_PROVIDER_SITE_OTHER): Payer: Medicare Other | Admitting: Otolaryngology

## 2014-01-13 ENCOUNTER — Ambulatory Visit (INDEPENDENT_AMBULATORY_CARE_PROVIDER_SITE_OTHER): Payer: Medicare Other | Admitting: Otolaryngology

## 2014-02-04 ENCOUNTER — Ambulatory Visit: Payer: Medicare Other | Admitting: Pulmonary Disease

## 2014-02-07 ENCOUNTER — Encounter: Payer: Self-pay | Admitting: Pulmonary Disease

## 2014-02-07 ENCOUNTER — Ambulatory Visit (INDEPENDENT_AMBULATORY_CARE_PROVIDER_SITE_OTHER): Payer: Medicare Other | Admitting: Pulmonary Disease

## 2014-02-07 VITALS — BP 140/78 | HR 88 | Temp 98.0°F | Ht 66.0 in | Wt 212.2 lb

## 2014-02-07 DIAGNOSIS — J449 Chronic obstructive pulmonary disease, unspecified: Secondary | ICD-10-CM

## 2014-02-07 NOTE — Progress Notes (Signed)
   Subjective:    Patient ID: Sandra Cuevas, female    DOB: Mar 15, 1929, 80 y.o.   MRN: [id number]  HPI The patient comes in today for follow-up of her known chronic obstructive asthma. She has been doing well on her current regimen, and denies any increased breathing issues or recent pulmonary infection. She does continue to have ongoing hoarseness, and apparently has seen otolaryngology as well as discontinuing her ACE inhibitor. The patient tells me that she was found to have swollen vocal cords on exam of unknown origin.   Review of Systems  Constitutional: Negative for fever and unexpected weight change.  HENT: Positive for voice change. Negative for congestion, dental problem, ear pain, nosebleeds, postnasal drip, rhinorrhea, sinus pressure, sneezing, sore throat and trouble swallowing.   Eyes: Negative for redness and itching.  Respiratory: Positive for shortness of breath and wheezing. Negative for cough and chest tightness.   Cardiovascular: Negative for palpitations and leg swelling.  Gastrointestinal: Negative for nausea and vomiting.  Genitourinary: Negative for dysuria.  Musculoskeletal: Negative for joint swelling.  Skin: Negative for rash.  Neurological: Negative for headaches.  Hematological: Does not bruise/bleed easily.  Psychiatric/Behavioral: Negative for dysphoric mood. The patient is not nervous/anxious.        Objective:   Physical Exam Overweight female in no acute distress Nose without purulence or discharge noted Neck without lymphadenopathy or thyromegaly Chest totally clear to auscultation, no wheezing Exam with regular rate and rhythm Lower extremities with mild ankle edema, no cyanosis Alert and oriented, moves all 4 extremities.       Assessment & Plan:

## 2014-02-07 NOTE — Patient Instructions (Signed)
No change in your asthma medications. You need to get back with your ENT doctor for your ongoing hoarseness.  followup with me again in 56mos.

## 2014-02-07 NOTE — Assessment & Plan Note (Signed)
The patient is doing very well overall from a chronic obstructive asthma standpoint on her current regimen. She has not had an acute flare or required excessive rescue inhaler use since the last visit.  She is continuing to have ongoing upper airway issues with hoarseness, and apparently has seen otolaryngology where she was found to have "swollen vocal cords" according to the patient. She is now off of her ace inhibitor. I have asked her to stay on her current breathing medication, and to make sure she follows up with otolaryngology. Her lungs are totally clear on auscultation today.

## 2014-02-21 ENCOUNTER — Encounter (INDEPENDENT_AMBULATORY_CARE_PROVIDER_SITE_OTHER): Payer: Medicare Other | Admitting: Ophthalmology

## 2014-02-21 DIAGNOSIS — I10 Essential (primary) hypertension: Secondary | ICD-10-CM

## 2014-02-21 DIAGNOSIS — H35033 Hypertensive retinopathy, bilateral: Secondary | ICD-10-CM

## 2014-02-21 DIAGNOSIS — H43813 Vitreous degeneration, bilateral: Secondary | ICD-10-CM

## 2014-02-21 DIAGNOSIS — H3531 Nonexudative age-related macular degeneration: Secondary | ICD-10-CM

## 2014-03-21 ENCOUNTER — Telehealth: Payer: Self-pay | Admitting: Pulmonary Disease

## 2014-03-21 MED ORDER — BUDESONIDE-FORMOTEROL FUMARATE 160-4.5 MCG/ACT IN AERO: 2.0000 | INHALATION_SPRAY | Freq: Two times a day (BID) | RESPIRATORY_TRACT | Status: DC

## 2014-03-21 NOTE — Telephone Encounter (Signed)
Called pt and aware RX has been sent in for symbicort. Nothing further needed

## 2014-03-24 ENCOUNTER — Ambulatory Visit (INDEPENDENT_AMBULATORY_CARE_PROVIDER_SITE_OTHER): Payer: Medicare Other | Admitting: Cardiovascular Disease

## 2014-03-24 ENCOUNTER — Encounter: Payer: Self-pay | Admitting: Cardiovascular Disease

## 2014-03-24 DIAGNOSIS — I5031 Acute diastolic (congestive) heart failure: Secondary | ICD-10-CM

## 2014-03-24 DIAGNOSIS — I493 Ventricular premature depolarization: Secondary | ICD-10-CM

## 2014-03-24 LAB — LIPID PANEL
Cholesterol: 175 mg/dL (ref 0–200)
HDL: 49.2 mg/dL (ref >39.00)
LDL Cholesterol: 112 mg/dL — ABNORMAL HIGH (ref 0–99)
NonHDL: 125.8
Total CHOL/HDL Ratio: 4
Triglycerides: 67 mg/dL (ref 0.0–149.0)
VLDL: 13.4 mg/dL (ref 0.0–40.0)

## 2014-03-24 LAB — BASIC METABOLIC PANEL
BUN: 22 mg/dL (ref 6–23)
CO2: 27 mEq/L (ref 19–32)
Calcium: 9.5 mg/dL (ref 8.4–10.5)
Chloride: 103 mEq/L (ref 96–112)
Creatinine, Ser: 1.2 mg/dL (ref 0.4–1.2)
GFR: 45.09 mL/min — AB (ref >60.00)
GLUCOSE: 90 mg/dL (ref 70–99)
POTASSIUM: 4.6 meq/L (ref 3.5–5.1)
SODIUM: 139 meq/L (ref 135–145)

## 2014-03-24 LAB — HEPATIC FUNCTION PANEL
ALBUMIN: 3.9 g/dL (ref 3.5–5.2)
ALT: 14 U/L (ref 0–35)
AST: 19 U/L (ref 0–37)
Alkaline Phosphatase: 72 U/L (ref 39–117)
Bilirubin, Direct: 0 mg/dL (ref 0.0–0.3)
Total Bilirubin: 0.5 mg/dL (ref 0.2–1.2)
Total Protein: 6.7 g/dL (ref 6.0–8.3)

## 2014-03-24 NOTE — Progress Notes (Signed)
Ceasar Cuevas Date of Birth  [birth year]       [facility]    [employer] [address], [place], [place]  [address]   [place], [place] Cue[place]  [address] [phone]     [phone]   Fax  [phone]    Fax [phone]  Problem List: 1. Chest pain-normal stress Myoview study 2. Dyspnea 3. Hypertension 4. Diabetes Mellitus 5. Carotid bruit IMPRESSION: Less than 50% stenosis in the right internal carotid artery with some plaque.  Stable 50-69% stenosis in the left internal carotid artery with significant calcified plaque.  Increasing velocity in the left external carotid artery compatible with significant narrowing.  6. RBBB  History of Present Illness: [name] is an 80 year old female with a history of atypical chest pains. She had a negative stress Myoview study in March of 2012. She has a history of asthma and uses albuterol on an as-needed basis. She continues to have shortness of breath with exertion. He also has a history of hypertension.  She complains of chronic dyspnea.   She also has lots of back pain.  Dec. 16, 2014:  [name] is doing well from a cardiac standpoint. She's having lots of noncardiac problems. She's having problems with her bowels. She has lots of neck pain. She had an MRI performed several weeks ago which reveals significant cervical arthritis and disc disease. Her blood pressure has been normal.  She also has lots of asthma. She has used her inhalers on a regular basis.  Dec. 17, 2015:  [name] is a 80 yo with hx of noncardiac CP,  HTN, carotid bruit, and DM.  She may have some sinus isues, hoarsness and scratchy throat.    Current Outpatient Prescriptions on File Prior to Visit  Medication Sig Dispense Refill  . albuterol (PROVENTIL HFA;VENTOLIN HFA) 108 (90 BASE) MCG/ACT inhaler Inhale 2 puffs into the lungs 2 (two) times daily.    Marland Kitchen albuterol (PROVENTIL) (5 MG/ML) 0.5% nebulizer  solution Take 0.5 mLs (2.5 mg total) by nebulization every 4 (four) hours as needed for wheezing or shortness of breath. 20 mL 12  . atorvastatin (LIPITOR) 10 MG tablet Take 10 mg by mouth Daily.     . budesonide-formoterol (SYMBICORT) 160-4.5 MCG/ACT inhaler Inhale 2 puffs into the lungs 2 (two) times daily. 1 Inhaler 12  . dexlansoprazole (DEXILANT) 60 MG capsule Take 60 mg by mouth daily.    . furosemide (LASIX) 40 MG tablet Take 40 mg by mouth daily.    Marland Kitchen levothyroxine (SYNTHROID, LEVOTHROID) 50 MCG tablet Take 50 mcg by mouth daily.      Marland Kitchen losartan (COZAAR) 50 MG tablet Once daily  0  . metFORMIN (GLUCOPHAGE) 500 MG tablet Take 1 tablet (500 mg total) by mouth 2 (two) times daily with a meal. 60 tablet 0  . polycarbophil (FIBERCON) 625 MG tablet Take 1,250 mg by mouth daily.     . Polyvinyl Alcohol-Povidone (MURINE TEARS FOR DRY EYES) 5-6 MG/ML SOLN Place 1 drop into both eyes daily as needed (Dry Eyes).    . potassium chloride (MICRO-K) 10 MEQ CR capsule Take 10 mEq by mouth daily.      No current facility-administered medications on file prior to visit.    No Known Allergies  Past Medical History  Diagnosis Date  . Hyperlipidemia   . Hypertension   . Atypical chest pain   . Hypothyroidism   . Diabetes mellitus   .  History of cardiovascular stress test 06/01/2010    EF 71% - no evidence of ischemia, normal left ventricular systolic function  . Shortness of breath     on exertion  . History of hysterectomy 1965  . GERD (gastroesophageal reflux disease)   . 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 Cardiac catheterization  2001    EF=65%  . Hemorrhoid surgery    . Abdominal hysterectomy      History  Smoking status  . Never Smoker   Smokeless tobacco  . Never Used    History  Alcohol Use No    Family History  Problem Relation Age of Onset  . Heart disease Father     heart problems  . Kidney failure  Mother   . Diabetes Mother   . Cancer Brother   . Diabetes Brother     Reviw of Systems:  Reviewed in the HPI.  All other systems are negative.  Physical Exam: Blood pressure 130/60, pulse 85, height 5\' 6"  (1.676 m), weight 210 lb 6.4 oz (95.437 kg). General: Well developed, well nourished, in no acute distress.  Head: Normocephalic, atraumatic, sclera non-icteric, mucus membranes are moist,   Neck: Supple. Carotids are 2 + without bruits. No JVD  Lungs: Clear bilaterally to auscultation.  Heart: regular rate.  normal  S1 S2. No murmurs, gallops or rubs.  Frequent PVCs  Abdomen: Soft, non-tender, non-distended with normal bowel sounds. No hepatomegaly. No rebound/guarding. No masses.  Msk:  Strength and tone are normal  Extremities: No clubbing or cyanosis. No edema.  Distal pedal pulses are 2+ and equal bilaterally.  Neuro: Alert and oriented X 3. Moves all extremities spontaneously.  Psych:  Responds to questions appropriately with a normal affect.  ECG: \Dec. 17, 2015:  NSR at 85. Occasional PVCs and PACs, RBBB.   Assessment / Plan:

## 2014-03-24 NOTE — Assessment & Plan Note (Signed)
She is doing well. No significant dyspnea. contiue lasix

## 2014-03-24 NOTE — Assessment & Plan Note (Signed)
Will check fasting labs today.   Continue atorvastatin .  Recheck in 1 year.

## 2014-03-24 NOTE — Patient Instructions (Signed)
Your physician recommends that you continue on your current medications as directed. Please refer to the Current Medication list given to you today.  Your physician wants you to follow-up in: 1 year with Dr. Nahser.  You will receive a reminder letter in the mail two months in advance. If you don't receive a letter, please call our office to schedule the follow-up appointment.  

## 2014-03-24 NOTE — Assessment & Plan Note (Signed)
BP is well controlled 

## 2014-03-28 ENCOUNTER — Encounter: Payer: Self-pay | Admitting: Cardiovascular Disease

## 2014-03-28 NOTE — Telephone Encounter (Signed)
New problem   Pt returning your call from last week concerning labs results. Please call pt.

## 2014-03-28 NOTE — Telephone Encounter (Signed)
This encounter was created in error - please disregard.

## 2014-05-02 ENCOUNTER — Encounter (INDEPENDENT_AMBULATORY_CARE_PROVIDER_SITE_OTHER): Payer: Medicare Other | Admitting: Ophthalmology

## 2014-05-11 ENCOUNTER — Encounter (INDEPENDENT_AMBULATORY_CARE_PROVIDER_SITE_OTHER): Payer: Medicare Other | Admitting: Ophthalmology

## 2014-05-11 DIAGNOSIS — H3531 Nonexudative age-related macular degeneration: Secondary | ICD-10-CM

## 2014-05-11 DIAGNOSIS — H3532 Exudative age-related macular degeneration: Secondary | ICD-10-CM

## 2014-05-11 DIAGNOSIS — H35033 Hypertensive retinopathy, bilateral: Secondary | ICD-10-CM | POA: Diagnosis not present

## 2014-05-11 DIAGNOSIS — I10 Essential (primary) hypertension: Secondary | ICD-10-CM

## 2014-05-11 DIAGNOSIS — H43813 Vitreous degeneration, bilateral: Secondary | ICD-10-CM

## 2014-07-20 ENCOUNTER — Encounter (INDEPENDENT_AMBULATORY_CARE_PROVIDER_SITE_OTHER): Payer: Medicare Other | Admitting: Ophthalmology

## 2014-08-08 ENCOUNTER — Ambulatory Visit: Payer: Medicare Other | Admitting: Pulmonary Disease

## 2014-08-30 ENCOUNTER — Encounter: Payer: Self-pay | Admitting: Pulmonary Disease

## 2014-08-30 ENCOUNTER — Ambulatory Visit (INDEPENDENT_AMBULATORY_CARE_PROVIDER_SITE_OTHER): Payer: Medicare Other | Admitting: Pulmonary Disease

## 2014-08-30 VITALS — BP 128/74 | HR 66 | Temp 97.8°F | Ht 66.0 in | Wt 214.4 lb

## 2014-08-30 DIAGNOSIS — J449 Chronic obstructive pulmonary disease, unspecified: Secondary | ICD-10-CM | POA: Diagnosis not present

## 2014-08-30 NOTE — Patient Instructions (Signed)
Continue on your current breathing medications. You need to followup with your ENT doctor for your hoarseness followup with Dr. Lamonte Sakai in 88mos.

## 2014-08-30 NOTE — Assessment & Plan Note (Signed)
The patient has done well since her last visit from an asthma standpoint, and feels that her breathing is at baseline. However, she has not been overly active since the recent death of her longtime husband. I've asked her to continue on her asthma regimen, and to work on some type of conditioning program and weight reduction. She is continuing to be hoarse, and apparently has a history of an abnormal vocal cord examination. I have asked her to get back with otolaryngology for further follow-up.

## 2014-08-30 NOTE — Progress Notes (Signed)
   Subjective:    Patient ID: Sandra Cuevas, female    DOB: Sep 28, 1928, 81 y.o.   MRN: [id number]  HPI Patient comes in today for follow-up of her known chronic obstructive asthma. She continues on her bronchodilator regimen, and has not had worsening symptoms or a flareup. She has recently lost her husband, and it has been difficult for her to get on with her life. She has not been very active. She has continued to have chronic hoarseness, and she had told me at the last visit that she had "swollen vocal cords". She has not returned back to her ENT physician, and I have asked her to do so.   Review of Systems  Constitutional: Negative for fever and unexpected weight change.  HENT: Positive for congestion and postnasal drip. Negative for dental problem, ear pain, nosebleeds, rhinorrhea, sinus pressure, sneezing, sore throat and trouble swallowing.   Eyes: Negative for redness and itching.  Respiratory: Positive for cough and shortness of breath. Negative for chest tightness and wheezing.   Cardiovascular: Positive for leg swelling. Negative for palpitations.  Gastrointestinal: Negative for nausea and vomiting.  Genitourinary: Negative for dysuria.  Musculoskeletal: Negative for joint swelling.  Skin: Negative for rash.  Neurological: Negative for headaches.  Hematological: Does not bruise/bleed easily.  Psychiatric/Behavioral: Negative for dysphoric mood. The patient is not nervous/anxious.        Objective:   Physical Exam Well-developed female in no acute distress. Hoarseness still present Nose without purulence or discharge noted Neck without lymphadenopathy or thyromegaly Chest with mildly decreased breath sounds, no wheezing Cardiac exam with regular rate and rhythm Lower extremities with mild ankle edema, no cyanosis Alert and oriented, moves all 4 extremities.       Assessment & Plan:

## 2014-10-14 DIAGNOSIS — I10 Essential (primary) hypertension: Secondary | ICD-10-CM | POA: Diagnosis not present

## 2014-10-14 DIAGNOSIS — Z6833 Body mass index (BMI) 33.0-33.9, adult: Secondary | ICD-10-CM | POA: Diagnosis not present

## 2014-10-14 DIAGNOSIS — Z1389 Encounter for screening for other disorder: Secondary | ICD-10-CM | POA: Diagnosis not present

## 2014-10-14 DIAGNOSIS — E6609 Other obesity due to excess calories: Secondary | ICD-10-CM | POA: Diagnosis not present

## 2014-10-14 DIAGNOSIS — E039 Hypothyroidism, unspecified: Secondary | ICD-10-CM | POA: Diagnosis not present

## 2014-10-14 DIAGNOSIS — E119 Type 2 diabetes mellitus without complications: Secondary | ICD-10-CM | POA: Diagnosis not present

## 2014-10-14 DIAGNOSIS — E782 Mixed hyperlipidemia: Secondary | ICD-10-CM | POA: Diagnosis not present

## 2014-11-14 ENCOUNTER — Encounter (INDEPENDENT_AMBULATORY_CARE_PROVIDER_SITE_OTHER): Payer: Medicare Other | Admitting: Ophthalmology

## 2014-11-14 DIAGNOSIS — H35033 Hypertensive retinopathy, bilateral: Secondary | ICD-10-CM | POA: Diagnosis not present

## 2014-11-14 DIAGNOSIS — H3531 Nonexudative age-related macular degeneration: Secondary | ICD-10-CM | POA: Diagnosis not present

## 2014-11-14 DIAGNOSIS — H43813 Vitreous degeneration, bilateral: Secondary | ICD-10-CM

## 2014-11-14 DIAGNOSIS — I10 Essential (primary) hypertension: Secondary | ICD-10-CM

## 2014-11-14 DIAGNOSIS — H3532 Exudative age-related macular degeneration: Secondary | ICD-10-CM

## 2014-11-18 DIAGNOSIS — J029 Acute pharyngitis, unspecified: Secondary | ICD-10-CM | POA: Diagnosis not present

## 2014-11-18 DIAGNOSIS — Z1389 Encounter for screening for other disorder: Secondary | ICD-10-CM | POA: Diagnosis not present

## 2014-11-18 DIAGNOSIS — J069 Acute upper respiratory infection, unspecified: Secondary | ICD-10-CM | POA: Diagnosis not present

## 2014-11-18 DIAGNOSIS — E6609 Other obesity due to excess calories: Secondary | ICD-10-CM | POA: Diagnosis not present

## 2014-11-18 DIAGNOSIS — Z6834 Body mass index (BMI) 34.0-34.9, adult: Secondary | ICD-10-CM | POA: Diagnosis not present

## 2015-01-23 ENCOUNTER — Encounter (INDEPENDENT_AMBULATORY_CARE_PROVIDER_SITE_OTHER): Payer: Medicare Other | Admitting: Ophthalmology

## 2015-01-23 DIAGNOSIS — H35033 Hypertensive retinopathy, bilateral: Secondary | ICD-10-CM

## 2015-01-23 DIAGNOSIS — H4313 Vitreous hemorrhage, bilateral: Secondary | ICD-10-CM | POA: Diagnosis not present

## 2015-01-23 DIAGNOSIS — H353212 Exudative age-related macular degeneration, right eye, with inactive choroidal neovascularization: Secondary | ICD-10-CM

## 2015-01-23 DIAGNOSIS — I10 Essential (primary) hypertension: Secondary | ICD-10-CM | POA: Diagnosis not present

## 2015-01-23 DIAGNOSIS — H353114 Nonexudative age-related macular degeneration, right eye, advanced atrophic with subfoveal involvement: Secondary | ICD-10-CM

## 2015-02-06 ENCOUNTER — Encounter: Payer: Self-pay | Admitting: Gastroenterology

## 2015-03-08 ENCOUNTER — Ambulatory Visit (INDEPENDENT_AMBULATORY_CARE_PROVIDER_SITE_OTHER): Payer: Medicare Other | Admitting: Emergency Medicine

## 2015-03-08 ENCOUNTER — Encounter: Payer: Self-pay | Admitting: Emergency Medicine

## 2015-03-08 VITALS — BP 138/82 | HR 78 | Ht 66.0 in | Wt 219.0 lb

## 2015-03-08 DIAGNOSIS — J449 Chronic obstructive pulmonary disease, unspecified: Secondary | ICD-10-CM

## 2015-03-08 DIAGNOSIS — Z23 Encounter for immunization: Secondary | ICD-10-CM | POA: Diagnosis not present

## 2015-03-08 DIAGNOSIS — J45909 Unspecified asthma, uncomplicated: Secondary | ICD-10-CM | POA: Diagnosis not present

## 2015-03-08 NOTE — Assessment & Plan Note (Signed)
Stable at this time. She does have chronic hoarse voice, but denies any dyspnea, wheezing, cough. She takes her Symbicort reliably. He has not yet been evaluated by ENT regarding her hoarseness. She is on aggressive GERD regimen although she tells me today that she is not sure why she is on Dexilant. I suspect that reflux has impacted her upper airway and asthma in the past. She rarely uses albuterol. We will continue her same regimen at this time. Follow in 6 months

## 2015-03-08 NOTE — Progress Notes (Signed)
Subjective:    Patient ID: Sandra Cuevas, female    DOB: 11/14/1928, 81 y.o.   MRN: [id number]  HPI 81 year old woman, never smoker, history of hypertension, hyperlipidemia, hypothyroidism, COPD on the basis of chronic asthma. The dx of asthma was first was made in late teens. She was treated initially but she no longer needed meds when she stopped working in [employer].  Pulmonary function testing performed 04/20/13 was reviewed by me today. This shows moderate obstruction without a bronchodilator response. She has been managed on Symbicort twice a day and albuterol when necessary. She is on Dexilant bid, is not using any allergy regimen at this time. She has a chronic hoarse voice, has undergone laryngoscopy before. She is active, is able to exert herself. She has nebs and albuterol HFA to use prn but rarely needs it. No cough. Rare wheeze. No CP.     Review of Systems As per HPI  Past Medical History  Diagnosis Date  . Hyperlipidemia   . Hypertension   . Atypical chest pain   . Hypothyroidism   . Diabetes mellitus   . History of cardiovascular stress test 06/01/2010    EF 71% - no evidence of ischemia, normal left ventricular systolic function  . Shortness of breath     on exertion  . History of hysterectomy 1965  . GERD (gastroesophageal reflux disease)   . 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 Heart disease Father     heart problems  . Kidney failure Mother   . Diabetes Mother   . Cancer Brother   . Diabetes Brother      Social History   Social History  . Marital Status: Married    Spouse Name: N/A  . Number of Children: N/A  . Years of Education: N/A   Occupational History  . Not on file.   Social History Main Topics  . Smoking status: Never Smoker   . Smokeless tobacco: Never Used  . Alcohol Use: No  . Drug Use: No  . Sexual Activity: No   Other Topics Concern  . Not on file   Social  History Narrative    Used to work at [employer], made cigarettes.  Worked in a [employer] also 13 yrs  No Known Allergies   Outpatient Prescriptions Prior to Visit  Medication Sig Dispense Refill  . albuterol (PROVENTIL HFA;VENTOLIN HFA) 108 (90 BASE) MCG/ACT inhaler Inhale 2 puffs into the lungs 2 (two) times daily.    Marland Kitchen albuterol (PROVENTIL) (5 MG/ML) 0.5% nebulizer solution Take 0.5 mLs (2.5 mg total) by nebulization every 4 (four) hours as needed for wheezing or shortness of breath. 20 mL 12  . atorvastatin (LIPITOR) 10 MG tablet Take 10 mg by mouth Daily.     . budesonide-formoterol (SYMBICORT) 160-4.5 MCG/ACT inhaler Inhale 2 puffs into the lungs 2 (two) times daily. 1 Inhaler 12  . dexlansoprazole (DEXILANT) 60 MG capsule Take 60 mg by mouth 2 (two) times daily.     . furosemide (LASIX) 40 MG tablet Take 40 mg by mouth as needed.     Marland Kitchen levothyroxine (SYNTHROID, LEVOTHROID) 50 MCG tablet Take 50 mcg by mouth daily.      Marland Kitchen losartan (COZAAR) 50 MG tablet Once daily  0  . metFORMIN (GLUCOPHAGE) 500 MG tablet Take 1 tablet (500 mg total) by mouth 2 (two) times daily  with a meal. 60 tablet 0  . Multiple Vitamins-Minerals (OCUVITE EYE + MULTI) TABS Take by mouth 2 (two) times daily.    . polycarbophil (FIBERCON) 625 MG tablet Take 1,250 mg by mouth daily.     . Polyvinyl Alcohol-Povidone (MURINE TEARS FOR DRY EYES) 5-6 MG/ML SOLN Place 1 drop into both eyes daily as needed (Dry Eyes).    . potassium chloride (MICRO-K) 10 MEQ CR capsule Take 10 mEq by mouth daily.      No facility-administered medications prior to visit.         Objective:   Physical Exam Filed Vitals:   03/08/15 1614  BP: 138/82  Pulse: 78  Height: 5\' 6"  (1.676 m)  Weight: 219 lb (99.338 kg)  SpO2: 93%   Gen: Pleasant, well-nourished, in no distress,  normal affect  ENT: No lesions,  mouth clear,  oropharynx clear, no postnasal drip, hoarse voice  Neck: No JVD, no TMG, no carotid bruits  Lungs: No use of  accessory muscles, clear without rales or rhonchi  Cardiovascular: RRR, heart sounds normal, no murmur or gallops, no peripheral edema  Musculoskeletal: No deformities, no cyanosis or clubbing  Neuro: alert, non focal  Skin: Warm, no lesions or rashes      Assessment & Plan:  Chronic obstructive airway disease with asthma Stable at this time. She does have chronic hoarse voice, but denies any dyspnea, wheezing, cough. She takes her Symbicort reliably. He has not yet been evaluated by ENT regarding her hoarseness. She is on aggressive GERD regimen although she tells me today that she is not sure why she is on Dexilant. I suspect that reflux has impacted her upper airway and asthma in the past. She rarely uses albuterol. We will continue her same regimen at this time. Follow in 6 months

## 2015-03-08 NOTE — Patient Instructions (Addendum)
Please continue your Symbicort twice a day  Use albuterol either through nebulizer or inhaler if needed for shortness of breath.  Continue your Dexilant twice a day.  Follow with Dr Lamonte Sakai in 6 months or sooner if you have any problems

## 2015-04-17 ENCOUNTER — Encounter (INDEPENDENT_AMBULATORY_CARE_PROVIDER_SITE_OTHER): Payer: Medicare Other | Admitting: Ophthalmology

## 2015-04-18 ENCOUNTER — Encounter: Payer: Self-pay | Admitting: Cardiovascular Disease

## 2015-04-18 ENCOUNTER — Ambulatory Visit (INDEPENDENT_AMBULATORY_CARE_PROVIDER_SITE_OTHER): Payer: Medicare Other | Admitting: Cardiovascular Disease

## 2015-04-18 VITALS — BP 130/68 | HR 92 | Ht 66.0 in | Wt 223.4 lb

## 2015-04-18 DIAGNOSIS — E785 Hyperlipidemia, unspecified: Secondary | ICD-10-CM

## 2015-04-18 DIAGNOSIS — R0789 Other chest pain: Secondary | ICD-10-CM | POA: Diagnosis not present

## 2015-04-18 DIAGNOSIS — R0602 Shortness of breath: Secondary | ICD-10-CM | POA: Diagnosis not present

## 2015-04-18 LAB — COMPREHENSIVE METABOLIC PANEL
ALK PHOS: 75 U/L (ref 33–130)
ALT: 8 U/L (ref 6–29)
AST: 14 U/L (ref 10–35)
Albumin: 3.7 g/dL (ref 3.6–5.1)
BILIRUBIN TOTAL: 0.4 mg/dL (ref 0.2–1.2)
BUN: 26 mg/dL — AB (ref 7–25)
CO2: 28 mmol/L (ref 20–31)
CREATININE: 1.22 mg/dL — AB (ref 0.60–0.88)
Calcium: 9.2 mg/dL (ref 8.6–10.4)
Chloride: 101 mmol/L (ref 98–110)
GLUCOSE: 121 mg/dL — AB (ref 65–99)
Potassium: 4.3 mmol/L (ref 3.5–5.3)
SODIUM: 143 mmol/L (ref 135–146)
Total Protein: 6.5 g/dL (ref 6.1–8.1)

## 2015-04-18 LAB — LIPID PANEL
Cholesterol: 178 mg/dL (ref 125–200)
HDL: 47 mg/dL (ref >=46)
LDL Cholesterol: 111 mg/dL (ref <130)
Total CHOL/HDL Ratio: 3.8 Ratio (ref <=5.0)
Triglycerides: 99 mg/dL (ref <150)
VLDL: 20 mg/dL (ref <30)

## 2015-04-18 NOTE — Progress Notes (Signed)
Sandra Cuevas Date of Birth  03-14-1929       [facility]    [employer] [address], [address], [place], [place]  [address]   [place],   [address] [phone]     [phone]   Fax  [phone]    Fax [phone]  Problem List: 1. Chest pain-normal stress Myoview study 2. Dyspnea 3. Hypertension 4. Diabetes Mellitus 5. Carotid bruit IMPRESSION: Less than 50% stenosis in the right internal carotid artery with some plaque.  Stable 50-69% stenosis in the left internal carotid artery with significant calcified plaque.  Increasing velocity in the left external carotid artery compatible with significant narrowing.  6. RBBB  History of Present Illness: Sandra Cuevas is an 82 year old female with a history of atypical chest pains. She had a negative stress Myoview study in March of 2012. She has a history of asthma and uses albuterol on an as-needed basis. She continues to have shortness of breath with exertion. He also has a history of hypertension.  She complains of chronic dyspnea.   She also has lots of back pain.  Dec. 16, 2014:  Sandra Cuevas is doing well from a cardiac standpoint. She's having lots of noncardiac problems. She's having problems with her bowels. She has lots of neck pain. She had an MRI performed several weeks ago which reveals significant cervical arthritis and disc disease. Her blood pressure has been normal.  She also has lots of asthma. She has used her inhalers on a regular basis.  Dec. 17, 2015:  Sandra Cuevas is a 82 yo with hx of noncardiac CP,  HTN, carotid bruit, and DM.  She may have some sinus isues, hoarsness and scratchy throat.   Jan. 10, 2017:  Oveall doing well. Has occasional intrascapular pain 3 weeks ago.   Lasted about 2 weeks.  Was worse with movement . Has resolved how.     Current Outpatient Prescriptions on File Prior to Visit  Medication Sig Dispense Refill  . albuterol  (PROVENTIL HFA;VENTOLIN HFA) 108 (90 BASE) MCG/ACT inhaler Inhale 2 puffs into the lungs 2 (two) times daily.    Marland Kitchen albuterol (PROVENTIL) (5 MG/ML) 0.5% nebulizer solution Take 0.5 mLs (2.5 mg total) by nebulization every 4 (four) hours as needed for wheezing or shortness of breath. 20 mL 12  . atorvastatin (LIPITOR) 10 MG tablet Take 10 mg by mouth Daily.     . budesonide-formoterol (SYMBICORT) 160-4.5 MCG/ACT inhaler Inhale 2 puffs into the lungs 2 (two) times daily. 1 Inhaler 12  . dexlansoprazole (DEXILANT) 60 MG capsule Take 60 mg by mouth 2 (two) times daily.     . furosemide (LASIX) 40 MG tablet Take 40 mg by mouth as needed.     Marland Kitchen levothyroxine (SYNTHROID, LEVOTHROID) 50 MCG tablet Take 50 mcg by mouth daily.      Marland Kitchen losartan (COZAAR) 50 MG tablet Once daily  0  . metFORMIN (GLUCOPHAGE) 500 MG tablet Take 1 tablet (500 mg total) by mouth 2 (two) times daily with a meal. 60 tablet 0  . Multiple Vitamins-Minerals (OCUVITE EYE + MULTI) TABS Take by mouth 2 (two) times daily.    . polycarbophil (FIBERCON) 625 MG tablet Take 1,250 mg by mouth daily.     . Polyvinyl Alcohol-Povidone (MURINE TEARS FOR DRY EYES) 5-6 MG/ML SOLN Place 1 drop into both eyes daily as needed (Dry Eyes).    . potassium chloride (MICRO-K) 10 MEQ CR capsule Take  10 mEq by mouth daily.      No current facility-administered medications on file prior to visit.    No Known Allergies  Past Medical History  Diagnosis Date  . Hyperlipidemia   . Hypertension   . Atypical chest pain   . Hypothyroidism   . Diabetes mellitus   . History of cardiovascular stress test 06/01/2010    EF 71% - no evidence of ischemia, normal left ventricular systolic function  . Shortness of breath     on exertion  . History of hysterectomy 1965  . GERD (gastroesophageal reflux disease)   . Cancer ([place])     CA OF FEMALE ORGANS 50 YEARS AGO "    Past Surgical History  Procedure Laterality Date  . Cardiac catheterization  2001    EF=65%   . Hemorrhoid surgery    . Abdominal hysterectomy      History  Smoking status  . Never Smoker   Smokeless tobacco  . Never Used    History  Alcohol Use No    Family History  Problem Relation Age of Onset  . Heart disease Father     heart problems  . Kidney failure Mother   . Diabetes Mother   . Cancer Brother   . Diabetes Brother     Reviw of Systems:  Reviewed in the HPI.  All other systems are negative.  Physical Exam: Blood pressure 130/68, pulse 92, height 5\' 6"  (1.676 m), weight 223 lb 6.4 oz (101.334 kg), SpO2 92 %. General: Well developed, well nourished, in no acute distress.  Head: Normocephalic, atraumatic, sclera non-icteric, mucus membranes are moist,   Neck: Supple. Carotids are 2 + without bruits. No JVD  Lungs: Clear bilaterally to auscultation.  Heart: regular rate.  normal  S1 S2. No murmurs, gallops or rubs.  Frequent PVCs  Abdomen: Soft, non-tender, non-distended with normal bowel sounds. No hepatomegaly. No rebound/guarding. No masses.  Msk:  Strength and tone are normal  Extremities: No clubbing or cyanosis. No edema.  Distal pedal pulses are 2+ and equal bilaterally.  Neuro: Alert and oriented X 3. Moves all extremities spontaneously.  Psych:  Responds to questions appropriately with a normal affect.  ECG: Jan. 10, 2017:  :  NSR at 89.  RBBB.   Assessment / Plan:   1. Chest pain-normal stress Myoview study, no recent CP   2. Dyspnea   3. Hypertension - BP is well controlled.   4. Diabetes Mellitus - fairly well controlled.   5. Carotid bruit - not heard today     Nahser, Wonda Cheng, MD  04/18/2015 9:57 AM    [facility] [place],  [place], [place]  [address] Pager [phone] Phone: [phone]; Fax: [phone]   [facility]  [address] [place], [place]  [address] [phone]   Fax [phone]

## 2015-04-18 NOTE — Patient Instructions (Signed)
Medication Instructions:  Your physician recommends that you continue on your current medications as directed. Please refer to the Current Medication list given to you today.   Labwork: TODAY - cholesterol, liver, basic metabolic panel   Testing/Procedures: None Ordered   Follow-Up: Your physician wants you to follow-up in: 1 year with Dr. Nahser.  You will receive a reminder letter in the mail two months in advance. If you don't receive a letter, please call our office to schedule the follow-up appointment.   If you need a refill on your cardiac medications before your next appointment, please call your pharmacy.   Thank you for choosing [facility]! Marik Sedore, RN [phone]   

## 2015-04-19 ENCOUNTER — Encounter (INDEPENDENT_AMBULATORY_CARE_PROVIDER_SITE_OTHER): Payer: Medicare Other | Admitting: Ophthalmology

## 2015-04-19 DIAGNOSIS — H35033 Hypertensive retinopathy, bilateral: Secondary | ICD-10-CM | POA: Diagnosis not present

## 2015-04-19 DIAGNOSIS — H353231 Exudative age-related macular degeneration, bilateral, with active choroidal neovascularization: Secondary | ICD-10-CM

## 2015-04-19 DIAGNOSIS — I10 Essential (primary) hypertension: Secondary | ICD-10-CM

## 2015-04-19 DIAGNOSIS — H43813 Vitreous degeneration, bilateral: Secondary | ICD-10-CM | POA: Diagnosis not present

## 2015-04-20 ENCOUNTER — Telehealth: Payer: Self-pay | Admitting: Cardiovascular Disease

## 2015-04-20 NOTE — Telephone Encounter (Signed)
Informed pt of lab results. Pt verbalized understanding. 

## 2015-04-20 NOTE — Telephone Encounter (Signed)
New message   Pt called back to discuss blood work results. Please call

## 2015-05-25 ENCOUNTER — Telehealth: Payer: Self-pay | Admitting: Emergency Medicine

## 2015-05-25 MED ORDER — BUDESONIDE-FORMOTEROL FUMARATE 160-4.5 MCG/ACT IN AERO: 2.0000 | INHALATION_SPRAY | Freq: Two times a day (BID) | RESPIRATORY_TRACT | Status: DC

## 2015-05-25 NOTE — Telephone Encounter (Signed)
ATC pt, line rang numerous times, NA and no VM WCB

## 2015-05-25 NOTE — Telephone Encounter (Signed)
Family called for medication refill. Symbicort 160-4.5 2 puff bid.  Sent to pharmacy

## 2015-07-12 ENCOUNTER — Encounter (INDEPENDENT_AMBULATORY_CARE_PROVIDER_SITE_OTHER): Payer: Medicare Other | Admitting: Ophthalmology

## 2015-07-12 DIAGNOSIS — I10 Essential (primary) hypertension: Secondary | ICD-10-CM

## 2015-07-12 DIAGNOSIS — H35033 Hypertensive retinopathy, bilateral: Secondary | ICD-10-CM | POA: Diagnosis not present

## 2015-07-12 DIAGNOSIS — H353114 Nonexudative age-related macular degeneration, right eye, advanced atrophic with subfoveal involvement: Secondary | ICD-10-CM

## 2015-07-12 DIAGNOSIS — E119 Type 2 diabetes mellitus without complications: Secondary | ICD-10-CM | POA: Diagnosis not present

## 2015-07-12 DIAGNOSIS — H43813 Vitreous degeneration, bilateral: Secondary | ICD-10-CM

## 2015-07-12 DIAGNOSIS — H353121 Nonexudative age-related macular degeneration, left eye, early dry stage: Secondary | ICD-10-CM | POA: Diagnosis not present

## 2015-07-24 DIAGNOSIS — Z961 Presence of intraocular lens: Secondary | ICD-10-CM | POA: Diagnosis not present

## 2015-07-24 DIAGNOSIS — H353221 Exudative age-related macular degeneration, left eye, with active choroidal neovascularization: Secondary | ICD-10-CM | POA: Diagnosis not present

## 2015-07-24 DIAGNOSIS — H353110 Nonexudative age-related macular degeneration, right eye, stage unspecified: Secondary | ICD-10-CM | POA: Diagnosis not present

## 2015-07-24 DIAGNOSIS — E119 Type 2 diabetes mellitus without complications: Secondary | ICD-10-CM | POA: Diagnosis not present

## 2015-07-24 DIAGNOSIS — E1165 Type 2 diabetes mellitus with hyperglycemia: Secondary | ICD-10-CM | POA: Diagnosis not present

## 2015-07-26 DIAGNOSIS — E039 Hypothyroidism, unspecified: Secondary | ICD-10-CM | POA: Diagnosis not present

## 2015-07-26 DIAGNOSIS — E119 Type 2 diabetes mellitus without complications: Secondary | ICD-10-CM | POA: Diagnosis not present

## 2015-07-26 DIAGNOSIS — M1991 Primary osteoarthritis, unspecified site: Secondary | ICD-10-CM | POA: Diagnosis not present

## 2015-07-26 DIAGNOSIS — E6609 Other obesity due to excess calories: Secondary | ICD-10-CM | POA: Diagnosis not present

## 2015-07-26 DIAGNOSIS — E782 Mixed hyperlipidemia: Secondary | ICD-10-CM | POA: Diagnosis not present

## 2015-07-26 DIAGNOSIS — I10 Essential (primary) hypertension: Secondary | ICD-10-CM | POA: Diagnosis not present

## 2015-07-26 DIAGNOSIS — Z6834 Body mass index (BMI) 34.0-34.9, adult: Secondary | ICD-10-CM | POA: Diagnosis not present

## 2015-09-05 ENCOUNTER — Encounter: Payer: Self-pay | Admitting: Emergency Medicine

## 2015-09-05 ENCOUNTER — Ambulatory Visit (INDEPENDENT_AMBULATORY_CARE_PROVIDER_SITE_OTHER): Payer: Medicare Other | Admitting: Emergency Medicine

## 2015-09-05 VITALS — BP 130/88 | HR 62 | Ht 66.0 in | Wt 206.0 lb

## 2015-09-05 DIAGNOSIS — R490 Dysphonia: Secondary | ICD-10-CM

## 2015-09-05 DIAGNOSIS — J45909 Unspecified asthma, uncomplicated: Secondary | ICD-10-CM

## 2015-09-05 DIAGNOSIS — J449 Chronic obstructive pulmonary disease, unspecified: Secondary | ICD-10-CM | POA: Diagnosis not present

## 2015-09-05 NOTE — Assessment & Plan Note (Signed)
Seen by ENT in the past. Clinically stable although certainly still present. She is on a high-dose PPI without breakthrough GERD.

## 2015-09-05 NOTE — Progress Notes (Signed)
Subjective:    Patient ID: Sandra Cuevas, female    DOB: 11/08/1928, 82 y.o.   MRN: [id number]  HPI 82 year old woman, never smoker, history of hypertension, hyperlipidemia, hypothyroidism, COPD on the basis of chronic asthma. The dx of asthma was first was made in late teens. She was treated initially but she no longer needed meds when she stopped working in [employer].  Pulmonary function testing performed 04/20/13 was reviewed by me today. This shows moderate obstruction without a bronchodilator response. She has been managed on Symbicort twice a day and albuterol when necessary. She is on Dexilant bid, is not using any allergy regimen at this time. She has a chronic hoarse voice, has undergone laryngoscopy before. She is active, is able to exert herself. She has nebs and albuterol HFA to use prn but rarely needs it. No cough. Rare wheeze. No CP.    ROV 09/05/15 -- follow-up visit for COPD in the setting of chronic fixed asthma.also with a chronic hoarse voice. She has seen ENT before - no pathology seen. She is on dexilant. No flares. She has albuterol available to use prn, has not needed it. On scheduled Symbicort. She is having a lot of back trouble that is limiting her activity, not her breathing. No real cough. Her hoarse voice is stable.    Review of Systems As per HPI  Past Medical History  Diagnosis Date  . Hyperlipidemia   . Hypertension   . Atypical chest pain   . Hypothyroidism   . Diabetes mellitus   . History of cardiovascular stress test 06/01/2010    EF 71% - no evidence of ischemia, normal left ventricular systolic function  . Shortness of breath     on exertion  . History of hysterectomy 1965  . GERD (gastroesophageal reflux disease)   . 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 Heart disease Father     heart problems  . Kidney failure Mother   . Diabetes Mother   . Cancer Brother   . Diabetes  Brother      Social History   Social History  . Marital Status: Married    Spouse Name: N/A  . Number of Children: N/A  . Years of Education: N/A   Occupational History  . Not on file.   Social History Main Topics  . Smoking status: Never Smoker   . Smokeless tobacco: Never Used  . Alcohol Use: No  . Drug Use: No  . Sexual Activity: No   Other Topics Concern  . Not on file   Social History Narrative    Used to work at [employer], made cigarettes.  Worked in a [employer] also 13 yrs  No Known Allergies   Outpatient Prescriptions Prior to Visit  Medication Sig Dispense Refill  . albuterol (PROVENTIL HFA;VENTOLIN HFA) 108 (90 BASE) MCG/ACT inhaler Inhale 2 puffs into the lungs 2 (two) times daily.    Marland Kitchen albuterol (PROVENTIL) (5 MG/ML) 0.5% nebulizer solution Take 0.5 mLs (2.5 mg total) by nebulization every 4 (four) hours as needed for wheezing or shortness of breath. 20 mL 12  . atorvastatin (LIPITOR) 10 MG tablet Take 10 mg by mouth Daily.     . budesonide-formoterol (SYMBICORT) 160-4.5 MCG/ACT inhaler Inhale 2 puffs into the lungs 2 (two) times daily. 1 Inhaler 12  . dexlansoprazole (DEXILANT) 60 MG capsule Take 60 mg  by mouth 2 (two) times daily.     . furosemide (LASIX) 40 MG tablet Take 40 mg by mouth as needed.     Marland Kitchen levothyroxine (SYNTHROID, LEVOTHROID) 50 MCG tablet Take 50 mcg by mouth daily.      Marland Kitchen losartan (COZAAR) 50 MG tablet Once daily  0  . metFORMIN (GLUCOPHAGE) 500 MG tablet Take 1 tablet (500 mg total) by mouth 2 (two) times daily with a meal. 60 tablet 0  . Multiple Vitamins-Minerals (OCUVITE EYE + MULTI) TABS Take by mouth 2 (two) times daily.    . polycarbophil (FIBERCON) 625 MG tablet Take 1,250 mg by mouth daily.     . Polyvinyl Alcohol-Povidone (MURINE TEARS FOR DRY EYES) 5-6 MG/ML SOLN Place 1 drop into both eyes daily as needed (Dry Eyes).    . potassium chloride (MICRO-K) 10 MEQ CR capsule Take 10 mEq by mouth daily.      No facility-administered  medications prior to visit.         Objective:   Physical Exam Filed Vitals:   09/05/15 0921  BP: 130/88  Pulse: 62  Height: 5\' 6"  (1.676 m)  Weight: 206 lb (93.441 kg)  SpO2: 96%   Gen: Pleasant, well-nourished, in no distress,  normal affect  ENT: No lesions,  mouth clear,  oropharynx clear, no postnasal drip, hoarse voice  Neck: No JVD, no TMG, no carotid bruits  Lungs: No use of accessory muscles, clear without rales or rhonchi  Cardiovascular: RRR, heart sounds normal, no murmur or gallops, no peripheral edema  Musculoskeletal: No deformities, no cyanosis or clubbing  Neuro: alert, non focal  Skin: Warm, no lesions or rashes      Assessment & Plan:  Chronic obstructive airway disease with asthma Please continue your symbicort twice a day  Keep albuterol available to use as needed.  Follow with Dr Lamonte Sakai in 12 months or sooner if you have any problems  Hoarse voice quality Seen by ENT in the past. Clinically stable although certainly still present. She is on a high-dose PPI without breakthrough GERD.

## 2015-09-05 NOTE — Assessment & Plan Note (Signed)
Please continue your symbicort twice a day  Keep albuterol available to use as needed.  Follow with Dr Lamonte Sakai in 12 months or sooner if you have any problems

## 2015-09-05 NOTE — Patient Instructions (Signed)
Please continue your symbicort twice a day  Keep albuterol available to use as needed.  Follow with Dr Lamonte Sakai in 12 months or sooner if you have any problems

## 2015-09-19 DIAGNOSIS — M7061 Trochanteric bursitis, right hip: Secondary | ICD-10-CM | POA: Diagnosis not present

## 2015-09-19 DIAGNOSIS — M47816 Spondylosis without myelopathy or radiculopathy, lumbar region: Secondary | ICD-10-CM | POA: Diagnosis not present

## 2015-09-19 DIAGNOSIS — M545 Low back pain: Secondary | ICD-10-CM | POA: Diagnosis not present

## 2015-10-04 ENCOUNTER — Encounter (INDEPENDENT_AMBULATORY_CARE_PROVIDER_SITE_OTHER): Payer: Medicare Other | Admitting: Ophthalmology

## 2015-10-12 DIAGNOSIS — E11319 Type 2 diabetes mellitus with unspecified diabetic retinopathy without macular edema: Secondary | ICD-10-CM | POA: Diagnosis not present

## 2015-10-12 DIAGNOSIS — Z7984 Long term (current) use of oral hypoglycemic drugs: Secondary | ICD-10-CM | POA: Diagnosis not present

## 2015-11-10 ENCOUNTER — Other Ambulatory Visit (HOSPITAL_COMMUNITY): Payer: Self-pay | Admitting: Family Medicine

## 2015-11-10 DIAGNOSIS — Z1231 Encounter for screening mammogram for malignant neoplasm of breast: Secondary | ICD-10-CM

## 2015-11-22 ENCOUNTER — Ambulatory Visit (HOSPITAL_COMMUNITY)
Admission: RE | Admit: 2015-11-22 | Discharge: 2015-11-22 | Disposition: A | Discharge status: Home / Self Care | Payer: Medicare Other | Source: Ambulatory Visit | Attending: Family Medicine | Admitting: Family Medicine

## 2015-11-22 DIAGNOSIS — Z1231 Encounter for screening mammogram for malignant neoplasm of breast: Secondary | ICD-10-CM

## 2015-11-29 DIAGNOSIS — M546 Pain in thoracic spine: Secondary | ICD-10-CM | POA: Diagnosis not present

## 2015-11-29 DIAGNOSIS — Z1389 Encounter for screening for other disorder: Secondary | ICD-10-CM | POA: Diagnosis not present

## 2015-11-29 DIAGNOSIS — E6609 Other obesity due to excess calories: Secondary | ICD-10-CM | POA: Diagnosis not present

## 2015-11-29 DIAGNOSIS — M1991 Primary osteoarthritis, unspecified site: Secondary | ICD-10-CM | POA: Diagnosis not present

## 2015-11-29 DIAGNOSIS — Z6832 Body mass index (BMI) 32.0-32.9, adult: Secondary | ICD-10-CM | POA: Diagnosis not present

## 2015-12-06 ENCOUNTER — Encounter (INDEPENDENT_AMBULATORY_CARE_PROVIDER_SITE_OTHER): Payer: Medicare Other | Admitting: Ophthalmology

## 2015-12-06 DIAGNOSIS — H35033 Hypertensive retinopathy, bilateral: Secondary | ICD-10-CM

## 2015-12-06 DIAGNOSIS — H43813 Vitreous degeneration, bilateral: Secondary | ICD-10-CM

## 2015-12-06 DIAGNOSIS — H353231 Exudative age-related macular degeneration, bilateral, with active choroidal neovascularization: Secondary | ICD-10-CM | POA: Diagnosis not present

## 2015-12-06 DIAGNOSIS — I10 Essential (primary) hypertension: Secondary | ICD-10-CM

## 2016-01-17 ENCOUNTER — Encounter (INDEPENDENT_AMBULATORY_CARE_PROVIDER_SITE_OTHER): Payer: Medicare Other | Admitting: Ophthalmology

## 2016-01-17 DIAGNOSIS — H35033 Hypertensive retinopathy, bilateral: Secondary | ICD-10-CM | POA: Diagnosis not present

## 2016-01-17 DIAGNOSIS — H43813 Vitreous degeneration, bilateral: Secondary | ICD-10-CM

## 2016-01-17 DIAGNOSIS — H353231 Exudative age-related macular degeneration, bilateral, with active choroidal neovascularization: Secondary | ICD-10-CM

## 2016-01-17 DIAGNOSIS — I10 Essential (primary) hypertension: Secondary | ICD-10-CM | POA: Diagnosis not present

## 2016-01-24 ENCOUNTER — Other Ambulatory Visit (INDEPENDENT_AMBULATORY_CARE_PROVIDER_SITE_OTHER): Payer: Medicare Other | Admitting: Ophthalmology

## 2016-01-24 DIAGNOSIS — H26491 Other secondary cataract, right eye: Secondary | ICD-10-CM

## 2016-03-13 ENCOUNTER — Encounter (INDEPENDENT_AMBULATORY_CARE_PROVIDER_SITE_OTHER): Payer: Medicare Other | Admitting: Ophthalmology

## 2016-03-13 DIAGNOSIS — H35033 Hypertensive retinopathy, bilateral: Secondary | ICD-10-CM

## 2016-03-13 DIAGNOSIS — I10 Essential (primary) hypertension: Secondary | ICD-10-CM

## 2016-03-13 DIAGNOSIS — H353114 Nonexudative age-related macular degeneration, right eye, advanced atrophic with subfoveal involvement: Secondary | ICD-10-CM

## 2016-03-13 DIAGNOSIS — H43813 Vitreous degeneration, bilateral: Secondary | ICD-10-CM | POA: Diagnosis not present

## 2016-03-13 DIAGNOSIS — H353221 Exudative age-related macular degeneration, left eye, with active choroidal neovascularization: Secondary | ICD-10-CM | POA: Diagnosis not present

## 2016-04-24 ENCOUNTER — Ambulatory Visit: Payer: Medicare Other | Admitting: Cardiovascular Disease

## 2016-04-30 ENCOUNTER — Encounter: Payer: Self-pay | Admitting: Cardiovascular Disease

## 2016-04-30 ENCOUNTER — Ambulatory Visit (INDEPENDENT_AMBULATORY_CARE_PROVIDER_SITE_OTHER): Payer: Medicare Other | Admitting: Cardiovascular Disease

## 2016-04-30 VITALS — BP 132/64 | HR 68 | Ht 67.0 in | Wt 208.4 lb

## 2016-04-30 DIAGNOSIS — I5032 Chronic diastolic (congestive) heart failure: Secondary | ICD-10-CM | POA: Insufficient documentation

## 2016-04-30 DIAGNOSIS — I5030 Unspecified diastolic (congestive) heart failure: Secondary | ICD-10-CM | POA: Insufficient documentation

## 2016-04-30 NOTE — Patient Instructions (Signed)
Medication Instructions:  Your physician recommends that you continue on your current medications as directed. Please refer to the Current Medication list given to you today.   Labwork: TODAY - basic metabolic panel   Testing/Procedures: None Ordered   Follow-Up: Your physician wants you to follow-up in: 1 year with Dr. Nahser. You will receive a reminder letter in the mail two months in advance. If you don't receive a letter, please call our office to schedule the follow-up appointment.   If you need a refill on your cardiac medications before your next appointment, please call your pharmacy.   Thank you for choosing [facility]! Kendyll Huettner, RN [phone]    

## 2016-04-30 NOTE — Progress Notes (Signed)
Sandra Cuevas Date of Birth  1928/05/14       [facility]    [employer] [address], [address], [place], [place]  [address]   [place], [place]  [address] [phone]     [phone]   Fax  [phone]    Fax [phone]  Problem List: 1. Chest pain-normal stress Myoview study 2.  Chronic diastolic CHF - grade 2 DD  3. Hypertension 4. Diabetes Mellitus 5. Carotid bruit  IMPRESSION: Less than 50% stenosis in the right internal carotid artery with some plaque.  Stable 50-69% stenosis in the left internal carotid artery with significant calcified plaque.  Increasing velocity in the left external carotid artery compatible with significant narrowing.  6. RBBB  History of Present Illness: Sandra Cuevas is an 83 year old female with a history of atypical chest pains. She had a negative stress Myoview study in March of 2012. She has a history of asthma and uses albuterol on an as-needed basis. She continues to have shortness of breath with exertion. He also has a history of hypertension.  She complains of chronic dyspnea.   She also has lots of back pain.  Dec. 16, 2014:  Sandra Cuevas is doing well from a cardiac standpoint. She's having lots of noncardiac problems. She's having problems with her bowels. She has lots of neck pain. She had an MRI performed several weeks ago which reveals significant cervical arthritis and disc disease. Her blood pressure has been normal.  She also has lots of asthma. She has used her inhalers on a regular basis.  Dec. 17, 2015:  Sandra Cuevas is a 83 yo with hx of noncardiac CP,  HTN, carotid bruit, and DM.  She may have some sinus isues, hoarsness and scratchy throat.   Jan. 10, 2017:  Sandra Cuevas doing well. Has occasional intrascapular pain 3 weeks ago.   Lasted about 2 weeks.  Was worse with movement . Has resolved how.   Jan. 23, 2018:  No cardiac issues.   Seen with daughter, [name] .  Has  back and hip issues.   No CP or dyspnea.   Does her normal activities. Does have occasional dyspnea and takes a Lasix to resolve this  Takes Lasix every 2-3 weeks at the most.   Has grade 2 diastolic dysfunction .    Current Outpatient Prescriptions on File Prior to Visit  Medication Sig Dispense Refill  . albuterol (PROVENTIL HFA;VENTOLIN HFA) 108 (90 BASE) MCG/ACT inhaler Inhale 2 puffs into the lungs 2 (two) times daily.    Marland Kitchen albuterol (PROVENTIL) (5 MG/ML) 0.5% nebulizer solution Take 0.5 mLs (2.5 mg total) by nebulization every 4 (four) hours as needed for wheezing or shortness of breath. 20 mL 12  . atorvastatin (LIPITOR) 10 MG tablet Take 10 mg by mouth Daily.     . budesonide-formoterol (SYMBICORT) 160-4.5 MCG/ACT inhaler Inhale 2 puffs into the lungs 2 (two) times daily. 1 Inhaler 12  . dexlansoprazole (DEXILANT) 60 MG capsule Take 60 mg by mouth 2 (two) times daily.     . furosemide (LASIX) 40 MG tablet Take 40 mg by mouth as needed.     Marland Kitchen levothyroxine (SYNTHROID, LEVOTHROID) 50 MCG tablet Take 50 mcg by mouth daily.      Marland Kitchen losartan (COZAAR) 50 MG tablet Once daily  0  . metFORMIN (GLUCOPHAGE) 500 MG tablet Take 1 tablet (500 mg total) by mouth 2 (two) times daily with a meal. 60 tablet  0  . Multiple Vitamins-Minerals (OCUVITE EYE + MULTI) TABS Take by mouth 2 (two) times daily.    . polycarbophil (FIBERCON) 625 MG tablet Take 1,250 mg by mouth daily.     . Polyvinyl Alcohol-Povidone (MURINE TEARS FOR DRY EYES) 5-6 MG/ML SOLN Place 1 drop into both eyes daily as needed (Dry Eyes).    . potassium chloride (MICRO-K) 10 MEQ CR capsule Take 10 mEq by mouth daily.      No current facility-administered medications on file prior to visit.     No Known Allergies  Past Medical History:  Diagnosis Date  . Atypical chest pain   . 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 Diabetes mellitus   . GERD (gastroesophageal reflux disease)   . History of cardiovascular stress test  06/01/2010   EF 71% - no evidence of ischemia, normal left ventricular systolic function  . History of hysterectomy 1965  . Hyperlipidemia   . Hypertension   . Hypothyroidism   . Shortness of breath    on exertion    Past Surgical History:  Procedure Laterality Date  . ABDOMINAL HYSTERECTOMY    . CARDIAC CATHETERIZATION  2001   EF=65%  . HEMORRHOID SURGERY      History  Smoking Status  . Never Smoker  Smokeless Tobacco  . Never Used    History  Alcohol Use No    Family History  Problem Relation Age of Onset  . Heart disease Father     heart problems  . Kidney failure Mother   . Diabetes Mother   . Cancer Brother   . Diabetes Brother     Reviw of Systems:  Reviewed in the HPI.  All other systems are negative.  Physical Exam: Blood pressure 132/64, pulse 68, height 5\' 7"  (1.702 m), weight 208 lb 6.4 oz (94.5 kg), SpO2 97 %. General: Well developed, well nourished, in no acute distress.  Head: Normocephalic, atraumatic, sclera non-icteric, mucus membranes are moist,   Neck: Supple. Carotids are 2 + without bruits. No JVD  Lungs: Clear bilaterally to auscultation.  Heart: regular rate.  normal  S1 S2. No murmurs, gallops or rubs.  Frequent PVCs  Abdomen: Soft, non-tender, non-distended with normal bowel sounds. No hepatomegaly. No rebound/guarding. No masses.  Msk:  Strength and tone are normal  Extremities: No clubbing or cyanosis. No edema.  Distal pedal pulses are 2+ and equal bilaterally.  Neuro: Alert and oriented X 3. Moves all extremities spontaneously.  Psych:  Responds to questions appropriately with a normal affect.  ECG: Jan. 10, 2017:  :  NSR at 71.  RBBB.   Assessment / Plan:   1. Chest pain-normal stress Myoview study, no recent CP   2. Chronic diastolic CHF - grade 2 DD.   Takes lasix PRN - every 2-3 weeks.   Check BMP today   3. Hypertension - BP is well controlled.   4. Diabetes Mellitus - fairly well controlled.   5. Carotid  bruit - not heard today     Mertie Moores, MD  04/30/2016 8:50 AM    [address],  [place], [place]  [address] Pager [phone] Phone: [phone]; Fax: [phone]

## 2016-05-01 LAB — BASIC METABOLIC PANEL
BUN/Creatinine Ratio: 16 (ref 12–28)
BUN: 18 mg/dL (ref 8–27)
CHLORIDE: 102 mmol/L (ref 96–106)
CO2: 26 mmol/L (ref 18–29)
Calcium: 8.4 mg/dL — ABNORMAL LOW (ref 8.7–10.3)
Creatinine, Ser: 1.14 mg/dL — ABNORMAL HIGH (ref 0.57–1.00)
GFR calc Af Amer: 51 mL/min/{1.73_m2} — ABNORMAL LOW (ref >59)
GFR calc non Af Amer: 44 mL/min/{1.73_m2} — ABNORMAL LOW (ref >59)
Glucose: 86 mg/dL (ref 65–99)
POTASSIUM: 4.6 mmol/L (ref 3.5–5.2)
SODIUM: 140 mmol/L (ref 134–144)

## 2016-05-22 ENCOUNTER — Encounter (INDEPENDENT_AMBULATORY_CARE_PROVIDER_SITE_OTHER): Payer: Medicare Other | Admitting: Ophthalmology

## 2016-05-22 DIAGNOSIS — H43813 Vitreous degeneration, bilateral: Secondary | ICD-10-CM

## 2016-05-22 DIAGNOSIS — I10 Essential (primary) hypertension: Secondary | ICD-10-CM

## 2016-05-22 DIAGNOSIS — E119 Type 2 diabetes mellitus without complications: Secondary | ICD-10-CM | POA: Diagnosis not present

## 2016-05-22 DIAGNOSIS — H353221 Exudative age-related macular degeneration, left eye, with active choroidal neovascularization: Secondary | ICD-10-CM

## 2016-05-22 DIAGNOSIS — H353114 Nonexudative age-related macular degeneration, right eye, advanced atrophic with subfoveal involvement: Secondary | ICD-10-CM

## 2016-05-22 DIAGNOSIS — H35033 Hypertensive retinopathy, bilateral: Secondary | ICD-10-CM

## 2016-07-04 ENCOUNTER — Emergency Department (HOSPITAL_COMMUNITY): Payer: Medicare Other

## 2016-07-04 ENCOUNTER — Encounter (HOSPITAL_COMMUNITY): Payer: Self-pay

## 2016-07-04 ENCOUNTER — Emergency Department (HOSPITAL_COMMUNITY)
Admission: EM | Admit: 2016-07-04 | Discharge: 2016-07-04 | Disposition: A | Discharge status: Home / Self Care | Payer: Medicare Other | Attending: Emergency Medicine | Admitting: Emergency Medicine

## 2016-07-04 DIAGNOSIS — E039 Hypothyroidism, unspecified: Secondary | ICD-10-CM | POA: Diagnosis not present

## 2016-07-04 DIAGNOSIS — Z79899 Other long term (current) drug therapy: Secondary | ICD-10-CM | POA: Insufficient documentation

## 2016-07-04 DIAGNOSIS — J449 Chronic obstructive pulmonary disease, unspecified: Secondary | ICD-10-CM | POA: Diagnosis not present

## 2016-07-04 DIAGNOSIS — R079 Chest pain, unspecified: Secondary | ICD-10-CM | POA: Diagnosis present

## 2016-07-04 DIAGNOSIS — D649 Anemia, unspecified: Secondary | ICD-10-CM | POA: Insufficient documentation

## 2016-07-04 DIAGNOSIS — E119 Type 2 diabetes mellitus without complications: Secondary | ICD-10-CM | POA: Insufficient documentation

## 2016-07-04 DIAGNOSIS — J45909 Unspecified asthma, uncomplicated: Secondary | ICD-10-CM | POA: Insufficient documentation

## 2016-07-04 DIAGNOSIS — I110 Hypertensive heart disease with heart failure: Secondary | ICD-10-CM | POA: Diagnosis not present

## 2016-07-04 DIAGNOSIS — Z7984 Long term (current) use of oral hypoglycemic drugs: Secondary | ICD-10-CM | POA: Diagnosis not present

## 2016-07-04 DIAGNOSIS — R0789 Other chest pain: Secondary | ICD-10-CM | POA: Diagnosis not present

## 2016-07-04 DIAGNOSIS — I5022 Chronic systolic (congestive) heart failure: Secondary | ICD-10-CM | POA: Insufficient documentation

## 2016-07-04 LAB — CBC
HCT: 33.5 % — ABNORMAL LOW (ref 36.0–46.0)
Hemoglobin: 10.9 g/dL — ABNORMAL LOW (ref 12.0–15.0)
MCH: 30.7 pg (ref 26.0–34.0)
MCHC: 32.5 g/dL (ref 30.0–36.0)
MCV: 94.4 fL (ref 78.0–100.0)
Platelets: 209 10*3/uL (ref 150–400)
RBC: 3.55 MIL/uL — AB (ref 3.87–5.11)
RDW: 13.6 % (ref 11.5–15.5)
WBC: 7.2 10*3/uL (ref 4.0–10.5)

## 2016-07-04 LAB — BASIC METABOLIC PANEL
Anion gap: 8 (ref 5–15)
BUN: 22 mg/dL — ABNORMAL HIGH (ref 6–20)
CHLORIDE: 104 mmol/L (ref 101–111)
CO2: 29 mmol/L (ref 22–32)
CREATININE: 1.11 mg/dL — AB (ref 0.44–1.00)
Calcium: 9 mg/dL (ref 8.9–10.3)
GFR calc non Af Amer: 44 mL/min — ABNORMAL LOW (ref >60)
GFR, EST AFRICAN AMERICAN: 51 mL/min — AB (ref >60)
Glucose, Bld: 105 mg/dL — ABNORMAL HIGH (ref 65–99)
Potassium: 4.4 mmol/L (ref 3.5–5.1)
Sodium: 141 mmol/L (ref 135–145)

## 2016-07-04 LAB — TROPONIN I

## 2016-07-04 MED ORDER — FENTANYL CITRATE (PF) 100 MCG/2ML IJ SOLN
50.0000 ug | Freq: Once | INTRAMUSCULAR | Status: AC
  Administered 2016-07-04: 50 ug via INTRAVENOUS
  Filled 2016-07-04: qty 2

## 2016-07-04 NOTE — ED Triage Notes (Signed)
Pt reports pain in the center of her chest that radiates to back and between shoulder blades woke her up at 2 am last night. Denies SOB, N/V D

## 2016-07-04 NOTE — ED Provider Notes (Signed)
[facility] DEPT Provider Note   CSN: [id number] Arrival date & time: 07/04/16  0831   By signing my name below, I, Sandra Cuevas, attest that this documentation has been prepared under the direction and in the presence of Sandra Christen, MD. Electronically Signed: Hilbert Cuevas, Scribe. 07/04/16. 8:58 AM. History   Chief Complaint Chief Complaint  Patient presents with  . Chest Pain    The history is provided by the patient. No language interpreter was used.  HPI Comments: Sandra Cuevas is a 83 y.o. female with a hx of asthma, HTN, hypothyroidism, and diabetes, who presents to the Emergency Department complaining of constant centralized chest pain since around 2 am this morning. She states that the pain radiates to her back between her shoulder blades. She states that she woke up with the pain and it has been constant ever since. She was fine prior to going to bed last night. She states that the pain has eased some since the onset but is still present. She denies hx of MI. The patient also reports that she is diaphoretic at night but this isn't new. She denies SOB, vomiting, nausea, and diarrhea.  Past Medical History:  Diagnosis Date  . Atypical chest pain   . 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 Diabetes mellitus   . GERD (gastroesophageal reflux disease)   . History of cardiovascular stress test 06/01/2010   EF 71% - no evidence of ischemia, normal left ventricular systolic function  . History of hysterectomy 1965  . Hyperlipidemia   . Hypertension   . Hypothyroidism   . Shortness of breath    on exertion    Patient Active Problem List   Diagnosis Date Noted  . Chronic diastolic CHF (congestive heart failure) ([place]) 04/30/2016  . Hoarse voice quality 09/05/2015  . Chronic obstructive airway disease with asthma ([place]) 04/20/2013  . Snoring 04/20/2013  . Premature ventricular contractions 03/23/2013  . Acute diastolic heart failure ([place]) 02/18/2013  .  Change in stool 01/06/2012  . Shortness of breath 09/26/2011  . Carpal tunnel syndrome 11/06/2010  . Hyperlipidemia   . Atypical chest pain   . Hypothyroidism   . Diabetes mellitus   . DM 02/12/2010  . EXTERNAL HEMORRHOIDS WITHOUT MENTION COMP 02/12/2010  . RECTAL BLEEDING 02/12/2010  . CERVICAL CANCER 10/18/2009  . HYPERLIPIDEMIA 10/18/2009  . Benign essential HTN 10/18/2009  . Cough 10/18/2009    Past Surgical History:  Procedure Laterality Date  . ABDOMINAL HYSTERECTOMY    . CARDIAC SURGERY     catherization  . HEMORRHOID SURGERY      OB History    No data available       Home Medications    Prior to Admission medications   Medication Sig Start Date End Date Taking? Authorizing Provider  acetaminophen (TYLENOL) 500 MG tablet Take 500 mg by mouth every 6 (six) hours as needed.   Yes Historical Provider, MD  albuterol (PROVENTIL HFA;VENTOLIN HFA) 108 (90 BASE) MCG/ACT inhaler Inhale 2 puffs into the lungs 2 (two) times daily. 02/25/13 09/04/16 Yes Shanker Kristeen Mans, MD  albuterol (PROVENTIL) (5 MG/ML) 0.5% nebulizer solution Take 0.5 mLs (2.5 mg total) by nebulization every 4 (four) hours as needed for wheezing or shortness of breath. 02/25/13  Yes Shanker Kristeen Mans, MD  atorvastatin (LIPITOR) 10 MG tablet Take 10 mg by mouth Daily.  07/13/10  Yes Historical Provider, MD  budesonide-formoterol (SYMBICORT) 160-4.5 MCG/ACT inhaler Inhale 2 puffs  into the lungs 2 (two) times daily. 05/25/15  Yes Juanito Doom, MD  docusate sodium (COLACE) 100 MG capsule Take 200 mg by mouth at bedtime.   Yes Historical Provider, MD  furosemide (LASIX) 40 MG tablet Take 40 mg by mouth as needed for fluid or edema.    Yes Historical Provider, MD  levothyroxine (SYNTHROID, LEVOTHROID) 50 MCG tablet Take 50 mcg by mouth daily.     Yes Historical Provider, MD  metFORMIN (GLUCOPHAGE) 500 MG tablet Take 1 tablet (500 mg total) by mouth 2 (two) times daily with a meal. Patient taking differently:  Take 500 mg by mouth daily as needed (high blood sugar).  02/25/13  Yes Shanker Kristeen Mans, MD  Multiple Vitamins-Minerals (OCUVITE EYE + MULTI) TABS Take by mouth 2 (two) times daily.   Yes Historical Provider, MD  polycarbophil (FIBERCON) 625 MG tablet Take 1,250 mg by mouth daily.    Yes Historical Provider, MD  Polyvinyl Alcohol-Povidone (MURINE TEARS FOR DRY EYES) 5-6 MG/ML SOLN Place 1 drop into both eyes daily as needed (Dry Eyes).   Yes Historical Provider, MD  potassium chloride (MICRO-K) 10 MEQ CR capsule Take 10 mEq by mouth daily.  08/15/10  Yes Historical Provider, MD  dexlansoprazole (DEXILANT) 60 MG capsule Take 60 mg by mouth 2 (two) times daily.     Historical Provider, MD  losartan (COZAAR) 50 MG tablet Once daily 01/26/14   Historical Provider, MD    Family History Family History  Problem Relation Age of Onset  . Heart disease Father     heart problems  . Kidney failure Mother   . Diabetes Mother   . Cancer Brother   . Diabetes Brother     Social History Social History  Substance Use Topics  . Smoking status: Never Smoker  . Smokeless tobacco: Never Used  . Alcohol use No     Allergies   Patient has no known allergies.   Review of Systems Review of Systems A complete 10 system review of systems was obtained and all systems are negative except as noted in the HPI and PMH.   Physical Exam Updated Vital Signs BP (!) 162/55   Pulse 60   Temp 98.3 F (36.8 C)   Resp 17   Ht 5\' 6"  (1.676 m)   Wt 205 lb (93 kg)   SpO2 93%   BMI 33.09 kg/m   Physical Exam  Constitutional: She is oriented to person, place, and time. She appears well-developed and well-nourished.  HENT:  Head: Normocephalic and atraumatic.  Eyes: Conjunctivae are normal.  Neck: Neck supple.  Cardiovascular: Normal rate and regular rhythm.   Pulmonary/Chest: Effort normal and breath sounds normal.  Abdominal: Soft. Bowel sounds are normal.  Musculoskeletal: Normal range of motion.    Neurological: She is alert and oriented to person, place, and time.  Skin: Skin is warm and dry.  Psychiatric: She has a normal mood and affect. Her behavior is normal.  Nursing note and vitals reviewed.    ED Treatments / Results  DIAGNOSTIC STUDIES: Oxygen Saturation is 96% on RA, adequate by my interpretation.    COORDINATION OF CARE: 8:49 AM Discussed treatment plan with pt at bedside and pt agreed to plan. I will check the patient's CXR and do a cardiac workup.   Labs (all labs ordered are listed, but only abnormal results are displayed) Labs Reviewed  BASIC METABOLIC PANEL - Abnormal; Notable for the following:       Result Value  Glucose, Bld 105 (*)    BUN 22 (*)    Creatinine, Ser 1.11 (*)    GFR calc non Af Amer 44 (*)    GFR calc Af Amer 51 (*)    All other components within normal limits  CBC - Abnormal; Notable for the following:    RBC 3.55 (*)    Hemoglobin 10.9 (*)    HCT 33.5 (*)    All other components within normal limits  TROPONIN I    EKG  EKG Interpretation  Date/Time:  Thursday July 04 2016 08:42:07 EDT Ventricular Rate:  68 PR Interval:    QRS Duration: 167 QT Interval:  481 QTC Calculation: 512 R Axis:   105 Text Interpretation:  Sinus rhythm Right bundle branch block Confirmed by Lacinda Axon  MD, Sandor Arboleda ([id number]) on 07/04/2016 9:49:38 AM       Radiology Dg Chest 2 View  Result Date: 07/04/2016 CLINICAL DATA:  Chest pain EXAM: CHEST  2 VIEW COMPARISON:  October 09, 2013 FINDINGS: There is no edema or consolidation. Heart is upper normal in size with pulmonary vascularity within normal limits. There is atherosclerotic calcification in the aorta. No adenopathy. There is a total shoulder replacement on the right. IMPRESSION: Aortic atherosclerosis.  No edema or consolidation. Electronically Signed   By: Lowella Grip III M.D.   On: 07/04/2016 09:46    Procedures Procedures (including critical care time)  Medications Ordered in ED Medications   fentaNYL (SUBLIMAZE) injection 50 mcg (50 mcg Intravenous Given 07/04/16 0928)     Initial Impression / Assessment and Plan / ED Course  I have reviewed the triage vital signs and the nursing notes.  Pertinent labs & imaging results that were available during my care of the patient were reviewed by me and considered in my medical decision making (see chart for details).     Presents with chest pain which has improved. She is hemodynamically stable. Workup including EKG, chest x-ray, troponin, labs all negative. She will follow-up with her primary care doctor or cardiologist  Final Clinical Impressions(s) / ED Diagnoses   Final diagnoses:  Chest pain, unspecified type  Anemia, unspecified type    New Prescriptions Discharge Medication List as of 07/04/2016  1:01 PM     I personally performed the services described in this documentation, which was scribed in my presence. The recorded information has been reviewed and is accurate.     Sandra Christen, MD 07/05/16 [phone]

## 2016-07-04 NOTE — Discharge Instructions (Signed)
You are slightly anemic. Hemoglobin today was 10.9. Follow-up your regular doctors or return if worse.

## 2016-07-05 ENCOUNTER — Emergency Department (HOSPITAL_COMMUNITY)
Admission: EM | Admit: 2016-07-05 | Discharge: 2016-07-05 | Disposition: A | Discharge status: Home / Self Care | Payer: Medicare Other | Attending: Emergency Medicine | Admitting: Emergency Medicine

## 2016-07-05 ENCOUNTER — Encounter (HOSPITAL_COMMUNITY): Payer: Self-pay | Admitting: Emergency Medicine

## 2016-07-05 ENCOUNTER — Emergency Department (HOSPITAL_COMMUNITY): Payer: Medicare Other

## 2016-07-05 DIAGNOSIS — E119 Type 2 diabetes mellitus without complications: Secondary | ICD-10-CM | POA: Insufficient documentation

## 2016-07-05 DIAGNOSIS — M546 Pain in thoracic spine: Secondary | ICD-10-CM | POA: Diagnosis not present

## 2016-07-05 DIAGNOSIS — E039 Hypothyroidism, unspecified: Secondary | ICD-10-CM | POA: Insufficient documentation

## 2016-07-05 DIAGNOSIS — R079 Chest pain, unspecified: Secondary | ICD-10-CM | POA: Diagnosis not present

## 2016-07-05 DIAGNOSIS — K551 Chronic vascular disorders of intestine: Secondary | ICD-10-CM

## 2016-07-05 DIAGNOSIS — R072 Precordial pain: Secondary | ICD-10-CM | POA: Diagnosis not present

## 2016-07-05 DIAGNOSIS — M4854XA Collapsed vertebra, not elsewhere classified, thoracic region, initial encounter for fracture: Secondary | ICD-10-CM

## 2016-07-05 DIAGNOSIS — Z7984 Long term (current) use of oral hypoglycemic drugs: Secondary | ICD-10-CM | POA: Insufficient documentation

## 2016-07-05 DIAGNOSIS — I5032 Chronic diastolic (congestive) heart failure: Secondary | ICD-10-CM | POA: Diagnosis not present

## 2016-07-05 DIAGNOSIS — Z8541 Personal history of malignant neoplasm of cervix uteri: Secondary | ICD-10-CM | POA: Insufficient documentation

## 2016-07-05 DIAGNOSIS — M8448XA Pathological fracture, other site, initial encounter for fracture: Secondary | ICD-10-CM | POA: Diagnosis not present

## 2016-07-05 DIAGNOSIS — I110 Hypertensive heart disease with heart failure: Secondary | ICD-10-CM | POA: Diagnosis not present

## 2016-07-05 DIAGNOSIS — Z79899 Other long term (current) drug therapy: Secondary | ICD-10-CM | POA: Insufficient documentation

## 2016-07-05 DIAGNOSIS — K5790 Diverticulosis of intestine, part unspecified, without perforation or abscess without bleeding: Secondary | ICD-10-CM | POA: Diagnosis not present

## 2016-07-05 DIAGNOSIS — I771 Stricture of artery: Secondary | ICD-10-CM

## 2016-07-05 LAB — HEPATIC FUNCTION PANEL
ALBUMIN: 3.2 g/dL — AB (ref 3.5–5.0)
ALK PHOS: 72 U/L (ref 38–126)
ALT: 14 U/L (ref 14–54)
AST: 17 U/L (ref 15–41)
BILIRUBIN TOTAL: 0.6 mg/dL (ref 0.3–1.2)
Bilirubin, Direct: 0.1 mg/dL (ref 0.1–0.5)
Indirect Bilirubin: 0.5 mg/dL (ref 0.3–0.9)
TOTAL PROTEIN: 6.6 g/dL (ref 6.5–8.1)

## 2016-07-05 LAB — BASIC METABOLIC PANEL
ANION GAP: 7 (ref 5–15)
BUN: 14 mg/dL (ref 6–20)
CHLORIDE: 103 mmol/L (ref 101–111)
CO2: 27 mmol/L (ref 22–32)
CREATININE: 1.04 mg/dL — AB (ref 0.44–1.00)
Calcium: 9 mg/dL (ref 8.9–10.3)
GFR calc non Af Amer: 48 mL/min — ABNORMAL LOW (ref >60)
GFR, EST AFRICAN AMERICAN: 55 mL/min — AB (ref >60)
Glucose, Bld: 112 mg/dL — ABNORMAL HIGH (ref 65–99)
POTASSIUM: 4.3 mmol/L (ref 3.5–5.1)
Sodium: 137 mmol/L (ref 135–145)

## 2016-07-05 LAB — POC OCCULT BLOOD, ED: Fecal Occult Bld: NEGATIVE

## 2016-07-05 LAB — CBC
HEMATOCRIT: 31.9 % — AB (ref 36.0–46.0)
HEMOGLOBIN: 10.4 g/dL — AB (ref 12.0–15.0)
MCH: 30.4 pg (ref 26.0–34.0)
MCHC: 32.6 g/dL (ref 30.0–36.0)
MCV: 93.3 fL (ref 78.0–100.0)
Platelets: 199 10*3/uL (ref 150–400)
RBC: 3.42 MIL/uL — AB (ref 3.87–5.11)
RDW: 13.8 % (ref 11.5–15.5)
WBC: 7.2 10*3/uL (ref 4.0–10.5)

## 2016-07-05 LAB — LIPASE, BLOOD: Lipase: 65 U/L — ABNORMAL HIGH (ref 11–51)

## 2016-07-05 LAB — I-STAT TROPONIN, ED: Troponin i, poc: 0 ng/mL (ref 0.00–0.08)

## 2016-07-05 IMAGING — CT CT ANGIO CHEST-ABD-PELV FOR DISSECTION W/ AND WO/W CM
1 of 2 series · 2 of 30 positions shown, 3 images · IV contrast (Iohexol (Omnipaque 350))
Comparison: CT chest 02/17/2013

CLINICAL DATA: Central chest pain radiating to back

EXAM:
CT ANGIOGRAPHY CHEST, ABDOMEN AND PELVIS
TECHNIQUE: Multidetector CT imaging through the chest, abdomen and pelvis was
performed using the standard protocol during bolus administration of
intravenous contrast. Multiplanar reconstructed images and MIPs were
obtained and reviewed to evaluate the vascular anatomy.
CONTRAST:  100 cc Isovue 370 IV

[Series 502: coronals, idose (1) · coronal · 0.45mm/px · 2 of 207 slices shown, 3 images]
[im 64/207  mediastinal]
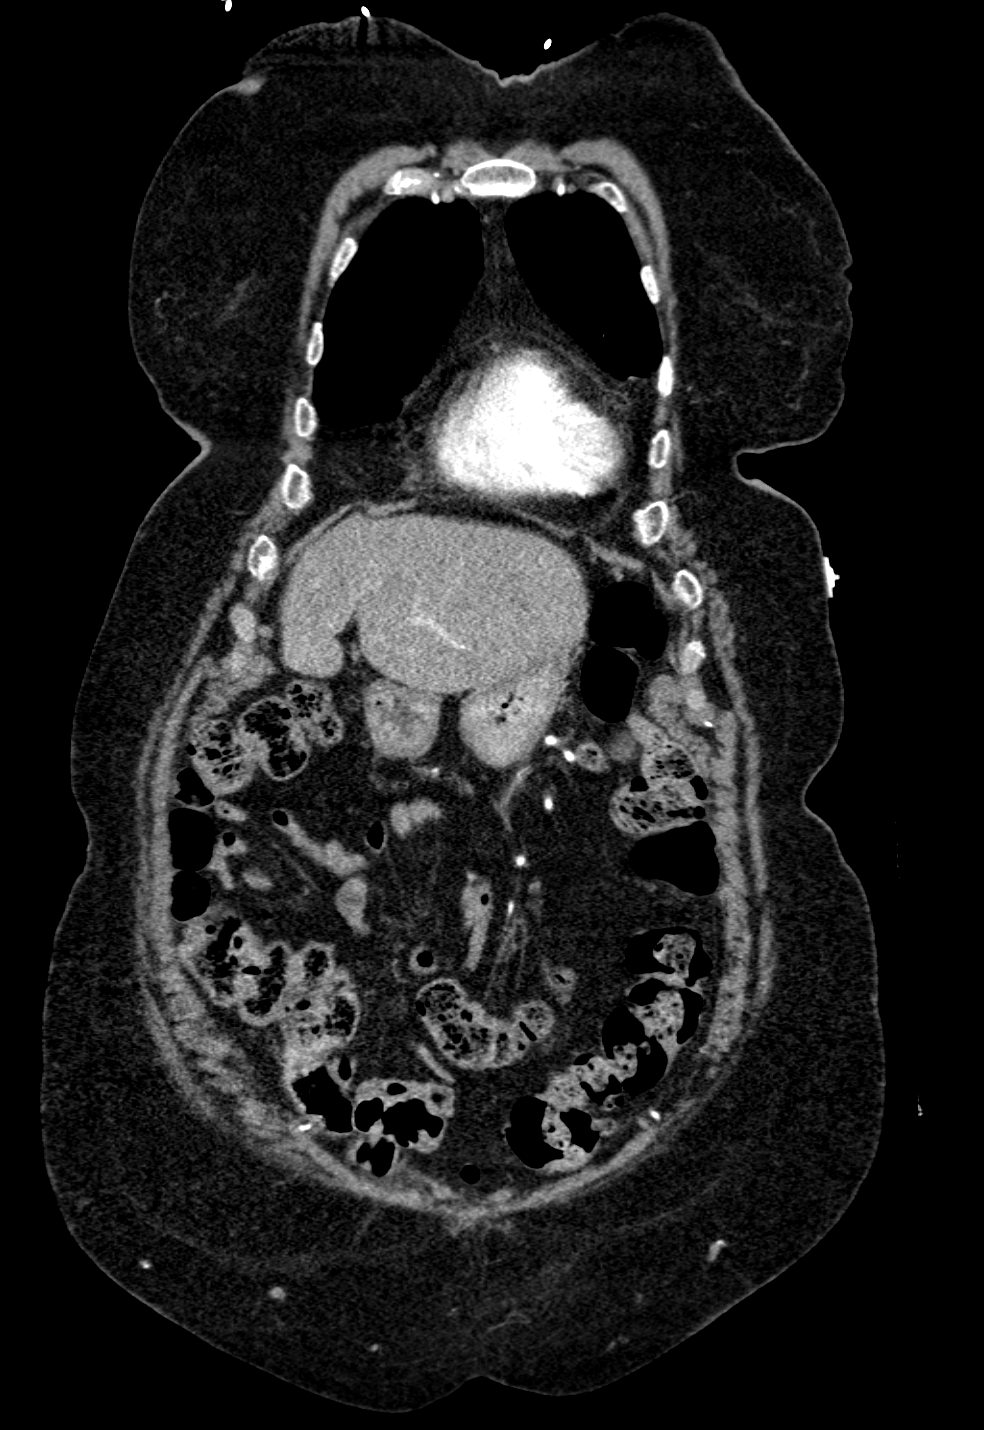
[im 64/207  bone]
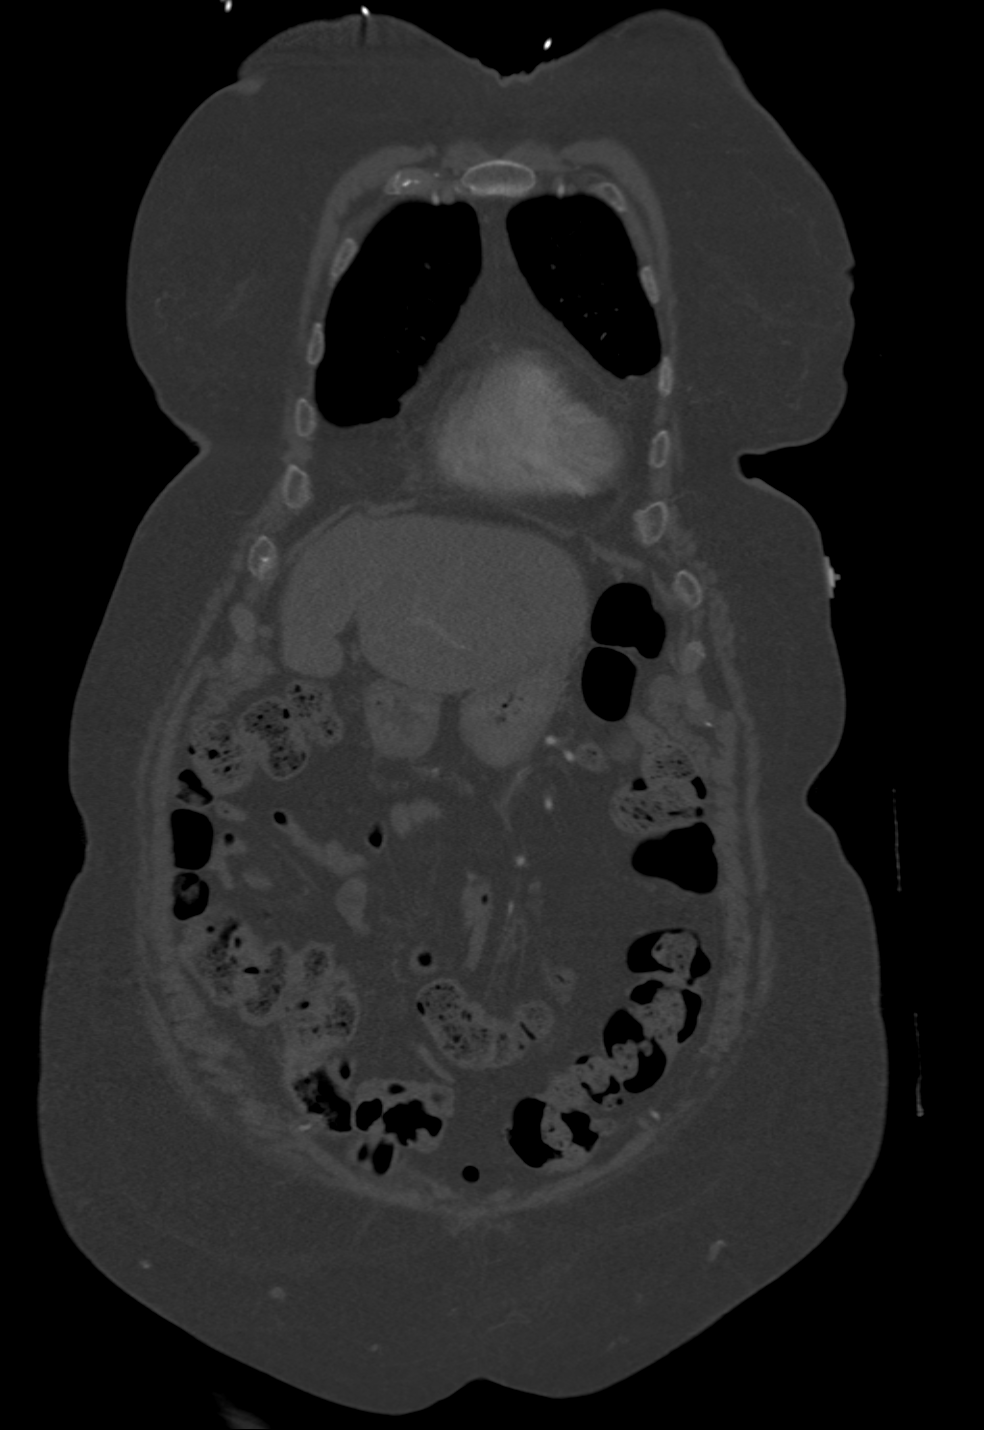
[im 143/207  mediastinal]
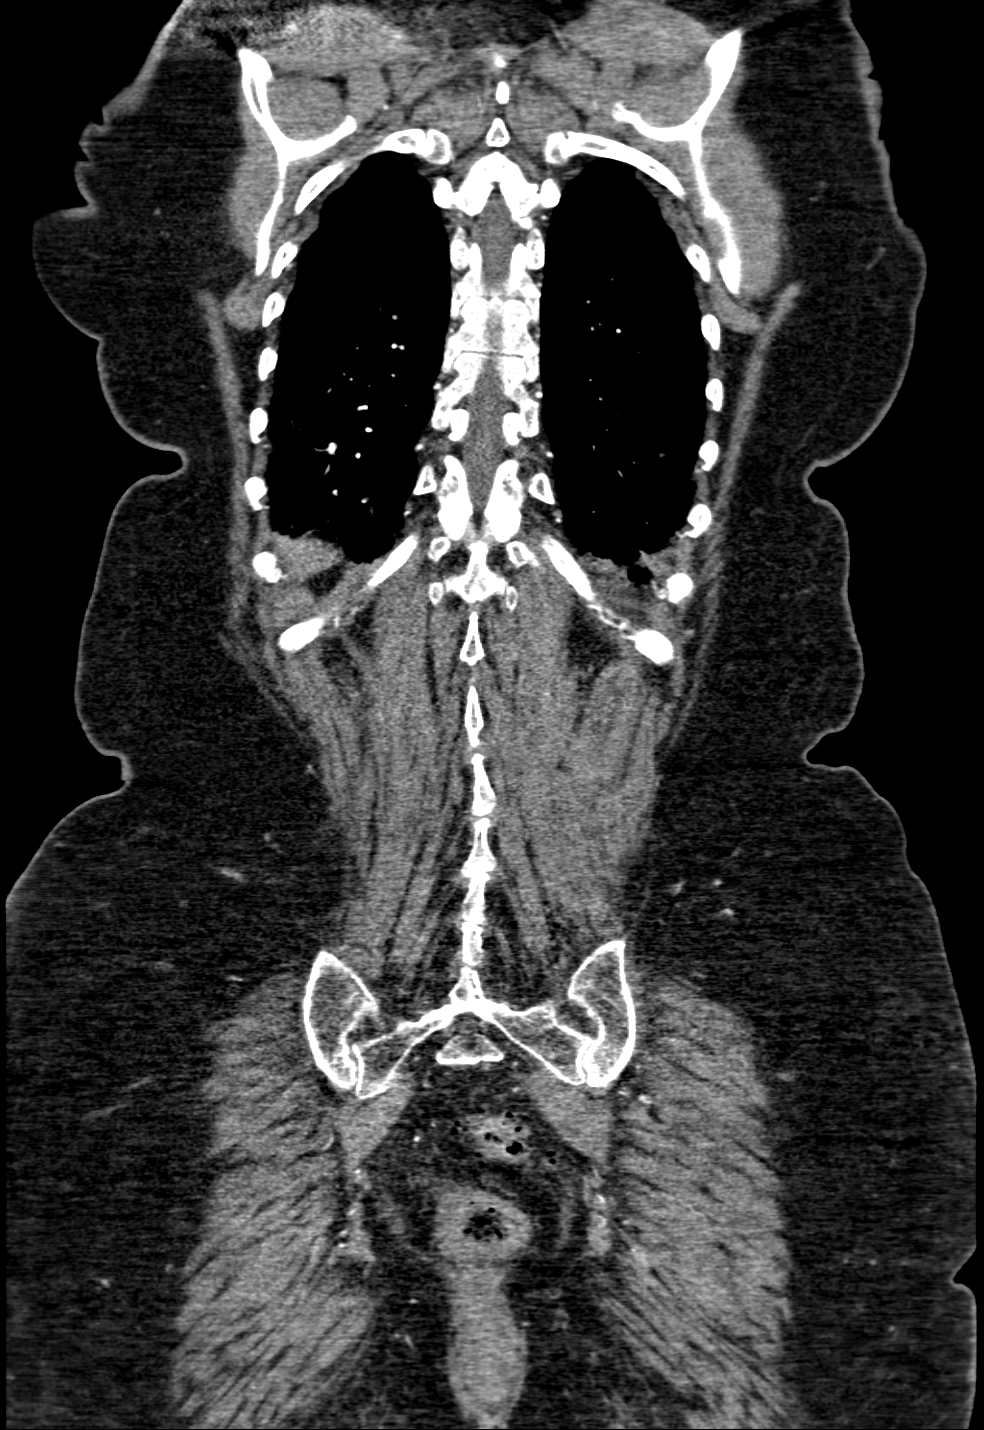

[2 of 30 positions shown; findings below may reference images not displayed]

FINDINGS: CTA CHEST FINDINGS

Cardiovascular: Atherosclerotic calcifications aorta and proximal
great vessels. Aorta normal caliber. No high attenuation mural
crescent to suggest intramural hemorrhage. Normal aortic enhancement
without dissection. Pulmonary arteries patent without evidence of
pulmonary embolism.

Mediastinum/Nodes: No definite esophageal abnormalities. Base of
cervical region normal appearance. No thoracic adenopathy.

Lungs/Pleura: Scattered respiratory motion artifacts. Minimal
dependent density at lung bases. Minimal subsegmental atelectasis
LEFT lower lobe. 6 mm RIGHT lower lobe nodule image 77. No definite
infiltrate, pleural effusion, or pneumothorax.

Musculoskeletal: RIGHT shoulder prosthesis with associated beam
hardening artifacts. Osseous demineralization. Superior endplate
compression fracture of T4 vertebral body, age-indeterminate but new
since 2891. Osseous demineralization.

Review of the MIP images confirms the above findings.

CTA ABDOMEN AND PELVIS FINDINGS

VASCULAR

Aorta: Abdominal aorta normal caliber with scattered atherosclerotic
calcifications. No evidence of aortic dissection.

Celiac: Atherosclerotic calcification at origin with probably less
than 50% narrowing.

SMA: High-grade stenosis at the origin of the SMA.

Renals: Approximate 50% diameter narrowing at the origin of the
RIGHT renal artery. Less than 50% diameter narrowing at the origin
of the LEFT renal artery.

IMA: Patent with greater than 50% narrowing at the origin

Inflow: Scattered atherosclerotic plaques at the common and external
iliac arteries with a mild degree of narrowing at the proximal RIGHT
common iliac artery.

Veins: Portal vein patent. Splenic vein patent. SMV and IVC un
opacified. Pelvic veins un opacified.

Review of the MIP images confirms the above findings.

NON-VASCULAR

Hepatobiliary: Liver and gallbladder normal appearance

Pancreas: Normal appearance

Spleen: Patchy enhancement consistent with timing of imaging versus
contrast administration. No gross mass.

Adrenals/Urinary Tract: Adrenal glands, kidneys, ureters, and
bladder normal appearance.

Stomach/Bowel: Appendix not definitely visualized. Stomach and small
bowel loops normal appearance. Significant diverticulosis of sigmoid
colon without evidence of diverticulitis. Unable to exclude rectal
wall thickening though this could be an artifact from
underdistention.

Lymphatic: No significant adenopathy.

Reproductive: Uterus surgically absent. Normal sized LEFT ovary.
RIGHT ovary not definitely visualized.

Other: No free air or free fluid. No hernia or inflammatory process.

Musculoskeletal: Osseous demineralization with degenerative disc
disease changes of lumbar spine

Review of the MIP images confirms the above findings.
IMPRESSION: Aortic atherosclerosis with a areas of narrowing identified at the
origins of the celiac, SMA, IMA, and renal arteries.

No evidence of aortic aneurysm or dissection.

Sigmoid diverticulosis without evidence of diverticulitis.

Superior endplate T4 compression fracture with mild anterior height
loss, indeterminate in appearance but new since [DATE] mm RIGHT lower lobe nodule, recommendation below.

Non-contrast chest CT at 6-12 months is recommended. If the nodule
is stable at time of repeat CT, then future CT at 18-24 months (from
today's scan) is considered optional for low-risk patients, but is
recommended for high-risk patients. This recommendation follows the
consensus statement: Guidelines for Management of Incidental
Pulmonary Nodules Detected on CT Images: From the [HOSPITAL]

## 2016-07-05 MED ORDER — ACETAMINOPHEN 500 MG PO TABS: 1000.0000 mg | ORAL_TABLET | Freq: Four times a day (QID) | ORAL | 0 refills | Status: DC | PRN

## 2016-07-05 MED ORDER — TRAMADOL HCL 50 MG PO TABS
50.0000 mg | ORAL_TABLET | Freq: Once | ORAL | Status: AC
  Administered 2016-07-05: 50 mg via ORAL
  Filled 2016-07-05: qty 1

## 2016-07-05 MED ORDER — PANTOPRAZOLE SODIUM 40 MG IV SOLR
40.0000 mg | Freq: Once | INTRAVENOUS | Status: AC
  Administered 2016-07-05: 40 mg via INTRAVENOUS
  Filled 2016-07-05: qty 40

## 2016-07-05 MED ORDER — TRAMADOL HCL 50 MG PO TABS: 50.0000 mg | ORAL_TABLET | Freq: Four times a day (QID) | ORAL | 0 refills | Status: DC | PRN

## 2016-07-05 MED ORDER — AMLODIPINE BESYLATE 5 MG PO TABS: 5.0000 mg | ORAL_TABLET | Freq: Every day | ORAL | 0 refills | Status: DC

## 2016-07-05 MED ORDER — GI COCKTAIL ~~LOC~~
30.0000 mL | Freq: Once | ORAL | Status: AC
  Administered 2016-07-05: 30 mL via ORAL
  Filled 2016-07-05: qty 30

## 2016-07-05 MED ORDER — LOSARTAN POTASSIUM 50 MG PO TABS
50.0000 mg | ORAL_TABLET | Freq: Once | ORAL | Status: AC
  Administered 2016-07-05: 50 mg via ORAL
  Filled 2016-07-05: qty 1

## 2016-07-05 MED ORDER — AMLODIPINE BESYLATE 5 MG PO TABS
5.0000 mg | ORAL_TABLET | Freq: Once | ORAL | Status: AC
  Administered 2016-07-05: 5 mg via ORAL
  Filled 2016-07-05: qty 1

## 2016-07-05 MED ORDER — IOPAMIDOL (ISOVUE-370) INJECTION 76%
INTRAVENOUS | Status: AC
  Administered 2016-07-05: 100 mL
  Filled 2016-07-05: qty 100

## 2016-07-05 MED ORDER — FAMOTIDINE 20 MG PO TABS: 20.0000 mg | ORAL_TABLET | Freq: Two times a day (BID) | ORAL | 0 refills | Status: DC

## 2016-07-05 NOTE — ED Triage Notes (Signed)
Pt reports central chest pain that radiates to her back since Wednesday night, denies sob or n/v, reports being seen at [facility] yesterday and discharged home. Pt reports cp continued after she was discharged. resp e/u, nad.

## 2016-07-05 NOTE — ED Notes (Signed)
Patient transported to CT 

## 2016-07-05 NOTE — ED Provider Notes (Signed)
[facility] DEPT Provider Note   CSN: [id number] Arrival date & time: 07/05/16  1053     History   Chief Complaint Chief Complaint  Patient presents with  . Chest Pain    HPI Sandra Cuevas is a 83 y.o. female.  HPI Patient is a continuous in fairly severe chest pain for about 24 hours now. It started yesterday early morning hours about 2 AM. Pain radiates into her posterior thoracic back. It has both a burning and aching quality to it. No associated symptoms. No shortness of breath, no recent cough or sputum production. No lower extremity calf pain or swelling. Patient was seen at [facility] emergency department yesterday morning and did not show signs of acute MI. Patient was discharged with chest pain instructions. Workup was negative. She reports she has not gotten any improvement.She reports she ate oatmeal yesterday and some beef stew yesterday evening. It did not have any impact on her pain, neither better nor worse. Past Medical History:  Diagnosis Date  . Atypical chest pain   . 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 Diabetes mellitus   . GERD (gastroesophageal reflux disease)   . History of cardiovascular stress test 06/01/2010   EF 71% - no evidence of ischemia, normal left ventricular systolic function  . History of hysterectomy 1965  . Hyperlipidemia   . Hypertension   . Hypothyroidism   . Shortness of breath    on exertion    Patient Active Problem List   Diagnosis Date Noted  . Chronic diastolic CHF (congestive heart failure) ([place]) 04/30/2016  . Hoarse voice quality 09/05/2015  . Chronic obstructive airway disease with asthma ([place]) 04/20/2013  . Snoring 04/20/2013  . Premature ventricular contractions 03/23/2013  . Acute diastolic heart failure ([place]) 02/18/2013  . Change in stool 01/06/2012  . Shortness of breath 09/26/2011  . Carpal tunnel syndrome 11/06/2010  . Hyperlipidemia   . Atypical chest pain   . Hypothyroidism   . Diabetes  mellitus   . DM 02/12/2010  . EXTERNAL HEMORRHOIDS WITHOUT MENTION COMP 02/12/2010  . RECTAL BLEEDING 02/12/2010  . CERVICAL CANCER 10/18/2009  . HYPERLIPIDEMIA 10/18/2009  . Benign essential HTN 10/18/2009  . Cough 10/18/2009    Past Surgical History:  Procedure Laterality Date  . ABDOMINAL HYSTERECTOMY    . CARDIAC SURGERY     catherization  . HEMORRHOID SURGERY      OB History    No data available       Home Medications    Prior to Admission medications   Medication Sig Start Date End Date Taking? Authorizing Provider  albuterol (PROVENTIL HFA;VENTOLIN HFA) 108 (90 BASE) MCG/ACT inhaler Inhale 2 puffs into the lungs 2 (two) times daily. 02/25/13 09/04/16 Yes Shanker Kristeen Mans, MD  albuterol (PROVENTIL) (5 MG/ML) 0.5% nebulizer solution Take 0.5 mLs (2.5 mg total) by nebulization every 4 (four) hours as needed for wheezing or shortness of breath. 02/25/13  Yes Shanker Kristeen Mans, MD  atorvastatin (LIPITOR) 10 MG tablet Take 10 mg by mouth Daily.  07/13/10  Yes Historical Provider, MD  dexlansoprazole (DEXILANT) 60 MG capsule Take 60 mg by mouth 2 (two) times daily.    Yes Historical Provider, MD  docusate sodium (COLACE) 100 MG capsule Take 200 mg by mouth at bedtime.   Yes Historical Provider, MD  furosemide (LASIX) 40 MG tablet Take 40 mg by mouth as needed for fluid or edema.    Yes Historical  Provider, MD  levothyroxine (SYNTHROID, LEVOTHROID) 50 MCG tablet Take 50 mcg by mouth daily.     Yes Historical Provider, MD  losartan (COZAAR) 50 MG tablet Once daily 01/26/14  Yes Historical Provider, MD  metFORMIN (GLUCOPHAGE) 500 MG tablet Take 1 tablet (500 mg total) by mouth 2 (two) times daily with a meal. Patient taking differently: Take 500 mg by mouth daily as needed (high blood sugar).  02/25/13  Yes Shanker Kristeen Mans, MD  Multiple Vitamins-Minerals (OCUVITE EYE + MULTI) TABS Take by mouth 2 (two) times daily.   Yes Historical Provider, MD  polycarbophil (FIBERCON) 625 MG  tablet Take 1,250 mg by mouth daily.    Yes Historical Provider, MD  Polyvinyl Alcohol-Povidone (MURINE TEARS FOR DRY EYES) 5-6 MG/ML SOLN Place 1 drop into both eyes daily as needed (Dry Eyes).   Yes Historical Provider, MD  potassium chloride (MICRO-K) 10 MEQ CR capsule Take 10 mEq by mouth daily.  08/15/10  Yes Historical Provider, MD  acetaminophen (TYLENOL) 500 MG tablet Take 2 tablets (1,000 mg total) by mouth every 6 (six) hours as needed. 07/05/16   Charlesetta Shanks, MD  budesonide-formoterol (SYMBICORT) 160-4.5 MCG/ACT inhaler Inhale 2 puffs into the lungs 2 (two) times daily. Patient not taking: Reported on 07/05/2016 05/25/15   Juanito Doom, MD  famotidine (PEPCID) 20 MG tablet Take 1 tablet (20 mg total) by mouth 2 (two) times daily. 07/05/16   Charlesetta Shanks, MD  traMADol (ULTRAM) 50 MG tablet Take 1 tablet (50 mg total) by mouth every 6 (six) hours as needed. 07/05/16   Charlesetta Shanks, MD    Family History Family History  Problem Relation Age of Onset  . Heart disease Father     heart problems  . Kidney failure Mother   . Diabetes Mother   . Cancer Brother   . Diabetes Brother     Social History Social History  Substance Use Topics  . Smoking status: Never Smoker  . Smokeless tobacco: Never Used  . Alcohol use No     Allergies   Patient has no known allergies.   Review of Systems Review of Systems 10 Systems reviewed and are negative for acute change except as noted in the HPI.   Physical Exam Updated Vital Signs BP (!) 173/62   Pulse 70   Temp 97.8 F (36.6 C) (Oral)   Resp 13   SpO2 98%   Physical Exam  Constitutional: She is oriented to person, place, and time. No distress.  Patient appears uncomfortable but is alert and nontoxic. Moderate obesity. No respiratory distress.  HENT:  Head: Normocephalic and atraumatic.  Mouth/Throat: Oropharynx is clear and moist.  Eyes: Conjunctivae and EOM are normal.  Neck: Neck supple.  Cardiovascular: Normal rate  and regular rhythm.   No murmur heard. Pulmonary/Chest: Effort normal and breath sounds normal. No respiratory distress.  Abdominal: Soft. She exhibits no distension. There is no tenderness. There is no guarding.  Musculoskeletal: She exhibits no edema or tenderness.  Neurological: She is alert and oriented to person, place, and time. No cranial nerve deficit. She exhibits normal muscle tone. Coordination normal.  Skin: Skin is warm and dry.  Psychiatric: She has a normal mood and affect.  Nursing note and vitals reviewed.    ED Treatments / Results  Labs (all labs ordered are listed, but only abnormal results are displayed) Labs Reviewed  BASIC METABOLIC PANEL - Abnormal; Notable for the following:       Result Value  Glucose, Bld 112 (*)    Creatinine, Ser 1.04 (*)    GFR calc non Af Amer 48 (*)    GFR calc Af Amer 55 (*)    All other components within normal limits  CBC - Abnormal; Notable for the following:    RBC 3.42 (*)    Hemoglobin 10.4 (*)    HCT 31.9 (*)    All other components within normal limits  HEPATIC FUNCTION PANEL - Abnormal; Notable for the following:    Albumin 3.2 (*)    All other components within normal limits  LIPASE, BLOOD - Abnormal; Notable for the following:    Lipase 65 (*)    All other components within normal limits  I-STAT TROPOININ, ED  POC OCCULT BLOOD, ED    EKG  EKG Interpretation None       Radiology Dg Chest 2 View  Result Date: 07/05/2016 CLINICAL DATA:  Chest pain. EXAM: CHEST  2 VIEW COMPARISON:  07/04/2016.  10/09/2013. FINDINGS: Mediastinum and hilar structures are normal. Lungs are clear. Cardiomegaly with normal pulmonary vascularity. No pleural effusion or pneumothorax. No acute bony abnormality. Right shoulder replacement. IMPRESSION: 1.  Cardiomegaly.  No pulmonary venous congestion. 2. No acute pulmonary disease. Electronically Signed   By: Marcello Moores  Register   On: 07/05/2016 11:16   Dg Chest 2 View  Result Date:  07/04/2016 CLINICAL DATA:  Chest pain EXAM: CHEST  2 VIEW COMPARISON:  October 09, 2013 FINDINGS: There is no edema or consolidation. Heart is upper normal in size with pulmonary vascularity within normal limits. There is atherosclerotic calcification in the aorta. No adenopathy. There is a total shoulder replacement on the right. IMPRESSION: Aortic atherosclerosis.  No edema or consolidation. Electronically Signed   By: Lowella Grip III M.D.   On: 07/04/2016 09:46   Ct Angio Chest/abd/pel For Dissection W And/or W/wo  Result Date: 07/05/2016 CLINICAL DATA:  Central chest pain radiating to back EXAM: CT ANGIOGRAPHY CHEST, ABDOMEN AND PELVIS TECHNIQUE: Multidetector CT imaging through the chest, abdomen and pelvis was performed using the standard protocol during bolus administration of intravenous contrast. Multiplanar reconstructed images and MIPs were obtained and reviewed to evaluate the vascular anatomy. CONTRAST:  100 cc Isovue 370 IV COMPARISON:  CT chest 02/17/2013 FINDINGS: CTA CHEST FINDINGS Cardiovascular: Atherosclerotic calcifications aorta and proximal great vessels. Aorta normal caliber. No high attenuation mural crescent to suggest intramural hemorrhage. Normal aortic enhancement without dissection. Pulmonary arteries patent without evidence of pulmonary embolism. Mediastinum/Nodes: No definite esophageal abnormalities. Base of cervical region normal appearance. No thoracic adenopathy. Lungs/Pleura: Scattered respiratory motion artifacts. Minimal dependent density at lung bases. Minimal subsegmental atelectasis LEFT lower lobe. 6 mm RIGHT lower lobe nodule image 77. No definite infiltrate, pleural effusion, or pneumothorax. Musculoskeletal: RIGHT shoulder prosthesis with associated beam hardening artifacts. Osseous demineralization. Superior endplate compression fracture of T4 vertebral body, age-indeterminate but new since 2014. Osseous demineralization. Review of the MIP images confirms the  above findings. CTA ABDOMEN AND PELVIS FINDINGS VASCULAR Aorta: Abdominal aorta normal caliber with scattered atherosclerotic calcifications. No evidence of aortic dissection. Celiac: Atherosclerotic calcification at origin with probably less than 50% narrowing. SMA: High-grade stenosis at the origin of the SMA. Renals: Approximate 50% diameter narrowing at the origin of the RIGHT renal artery. Less than 50% diameter narrowing at the origin of the LEFT renal artery. IMA: Patent with greater than 50% narrowing at the origin Inflow: Scattered atherosclerotic plaques at the common and external iliac arteries with a mild degree of narrowing  at the proximal RIGHT common iliac artery. Veins: Portal vein patent. Splenic vein patent. SMV and IVC un opacified. Pelvic veins un opacified. Review of the MIP images confirms the above findings. NON-VASCULAR Hepatobiliary: Liver and gallbladder normal appearance Pancreas: Normal appearance Spleen: Patchy enhancement consistent with timing of imaging versus contrast administration. No gross mass. Adrenals/Urinary Tract: Adrenal glands, kidneys, ureters, and bladder normal appearance. Stomach/Bowel: Appendix not definitely visualized. Stomach and small bowel loops normal appearance. Significant diverticulosis of sigmoid colon without evidence of diverticulitis. Unable to exclude rectal wall thickening though this could be an artifact from underdistention. Lymphatic: No significant adenopathy. Reproductive: Uterus surgically absent. Normal sized LEFT ovary. RIGHT ovary not definitely visualized. Other: No free air or free fluid. No hernia or inflammatory process. Musculoskeletal: Osseous demineralization with degenerative disc disease changes of lumbar spine Review of the MIP images confirms the above findings. IMPRESSION: Aortic atherosclerosis with a areas of narrowing identified at the origins of the celiac, SMA, IMA, and renal arteries. No evidence of aortic aneurysm or  dissection. Sigmoid diverticulosis without evidence of diverticulitis. Superior endplate T4 compression fracture with mild anterior height loss, indeterminate in appearance but new since 2014. 6 mm RIGHT lower lobe nodule, recommendation below. Non-contrast chest CT at 6-12 months is recommended. If the nodule is stable at time of repeat CT, then future CT at 18-24 months (from today's scan) is considered optional for low-risk patients, but is recommended for high-risk patients. This recommendation follows the consensus statement: Guidelines for Management of Incidental Pulmonary Nodules Detected on CT Images: From the Fleischner Society 2017; Radiology 2017; 284:228-243. Electronically Signed   By: Lavonia Dana M.D.   On: 07/05/2016 15:07    Procedures Procedures (including critical care time)  Medications Ordered in ED Medications  gi cocktail (Maalox,Lidocaine,Donnatal) (30 mLs Oral Given 07/05/16 1333)  pantoprazole (PROTONIX) injection 40 mg (40 mg Intravenous Given 07/05/16 1334)  iopamidol (ISOVUE-370) 76 % injection (100 mLs  Contrast Given 07/05/16 1429)  traMADol (ULTRAM) tablet 50 mg (50 mg Oral Given 07/05/16 1558)     Initial Impression / Assessment and Plan / ED Course  I have reviewed the triage vital signs and the nursing notes.  Pertinent labs & imaging results that were available during my care of the patient were reviewed by me and considered in my medical decision making (see chart for details).      Recheck: (15:30) patient reports feeling much better after treatment. She still endorses some pain between her shoulder blades but otherwise feels much better. Consult: (15:45) Dr. Bridgett Larsson vascular surgery. Arterial stenosis as incidental finding in chest pain evaluation. Patient can follow up on outpatient basis for arterial stenosis.  Final Clinical Impressions(s) / ED Diagnoses   Final diagnoses:  Precordial chest pain  Acute midline thoracic back pain  Non-traumatic  compression fracture of fourth thoracic vertebra, initial encounter ([place])  SMA stenosis ([place])   Patient has had over 24 hours of constant chest pain radiating to the back. Cardiac enzymes are negative and EKG is unchanged. At this time I do not suspect this to be cardiac ischemic in nature. I did obtain CT dissection study which does not show any evidence of dissection. Findings are positive for a T4 compression fracture which is consistent with the patient's thoracic pain. Incidentally noted were stenosis of the SMA and renal arteries. She does not have pain that would be suggestive of this. She does not have any postprandial pain. I did review this with Dr. Bridgett Larsson and she can  follow-up on an outpatient basis. Patient was given GI cocktail and Pepcid which seemed to alleviate much of her pain. Tramadol given for thoracic compression fracture. The patient has remained stable, alert and appropriate. She is up and ambulatory about the emergency department. She is instructed to follow up both with her primary care doctor and Dr. Bridgett Larsson. New Prescriptions New Prescriptions   ACETAMINOPHEN (TYLENOL) 500 MG TABLET    Take 2 tablets (1,000 mg total) by mouth every 6 (six) hours as needed.   FAMOTIDINE (PEPCID) 20 MG TABLET    Take 1 tablet (20 mg total) by mouth 2 (two) times daily.   TRAMADOL (ULTRAM) 50 MG TABLET    Take 1 tablet (50 mg total) by mouth every 6 (six) hours as needed.     Charlesetta Shanks, MD 07/05/16 [id number]

## 2016-07-05 NOTE — ED Notes (Signed)
Pt ambulatory to the restroom with 1 staff assist. 

## 2016-07-09 DIAGNOSIS — M545 Low back pain: Secondary | ICD-10-CM | POA: Diagnosis not present

## 2016-07-10 DIAGNOSIS — S22040A Wedge compression fracture of fourth thoracic vertebra, initial encounter for closed fracture: Secondary | ICD-10-CM | POA: Diagnosis not present

## 2016-07-10 DIAGNOSIS — I10 Essential (primary) hypertension: Secondary | ICD-10-CM | POA: Diagnosis not present

## 2016-07-10 DIAGNOSIS — R531 Weakness: Secondary | ICD-10-CM | POA: Diagnosis not present

## 2016-07-10 DIAGNOSIS — E782 Mixed hyperlipidemia: Secondary | ICD-10-CM | POA: Diagnosis not present

## 2016-07-10 DIAGNOSIS — L0390 Cellulitis, unspecified: Secondary | ICD-10-CM | POA: Diagnosis not present

## 2016-07-10 DIAGNOSIS — M545 Low back pain: Secondary | ICD-10-CM | POA: Diagnosis not present

## 2016-07-10 DIAGNOSIS — Z6834 Body mass index (BMI) 34.0-34.9, adult: Secondary | ICD-10-CM | POA: Diagnosis not present

## 2016-07-10 DIAGNOSIS — M1991 Primary osteoarthritis, unspecified site: Secondary | ICD-10-CM | POA: Diagnosis not present

## 2016-07-10 DIAGNOSIS — E6609 Other obesity due to excess calories: Secondary | ICD-10-CM | POA: Diagnosis not present

## 2016-07-10 DIAGNOSIS — E063 Autoimmune thyroiditis: Secondary | ICD-10-CM | POA: Diagnosis not present

## 2016-07-10 DIAGNOSIS — Z1389 Encounter for screening for other disorder: Secondary | ICD-10-CM | POA: Diagnosis not present

## 2016-07-11 ENCOUNTER — Encounter: Payer: Self-pay | Admitting: Vascular Surgery

## 2016-07-12 ENCOUNTER — Emergency Department (HOSPITAL_COMMUNITY): Payer: Medicare Other

## 2016-07-12 ENCOUNTER — Observation Stay (HOSPITAL_COMMUNITY): Payer: Medicare Other

## 2016-07-12 ENCOUNTER — Observation Stay (HOSPITAL_BASED_OUTPATIENT_CLINIC_OR_DEPARTMENT_OTHER)
Admission: EM | Admit: 2016-07-12 | Discharge: 2016-07-12 | Disposition: A | Discharge status: Home / Self Care | Payer: Medicare Other | Source: Home / Self Care | Attending: Emergency Medicine | Admitting: Emergency Medicine

## 2016-07-12 ENCOUNTER — Encounter (HOSPITAL_COMMUNITY): Payer: Self-pay | Admitting: Emergency Medicine

## 2016-07-12 DIAGNOSIS — I130 Hypertensive heart and chronic kidney disease with heart failure and stage 1 through stage 4 chronic kidney disease, or unspecified chronic kidney disease: Secondary | ICD-10-CM | POA: Diagnosis not present

## 2016-07-12 DIAGNOSIS — L03115 Cellulitis of right lower limb: Secondary | ICD-10-CM | POA: Diagnosis present

## 2016-07-12 DIAGNOSIS — I10 Essential (primary) hypertension: Secondary | ICD-10-CM | POA: Diagnosis present

## 2016-07-12 DIAGNOSIS — M549 Dorsalgia, unspecified: Secondary | ICD-10-CM

## 2016-07-12 DIAGNOSIS — I110 Hypertensive heart disease with heart failure: Secondary | ICD-10-CM | POA: Insufficient documentation

## 2016-07-12 DIAGNOSIS — N179 Acute kidney failure, unspecified: Secondary | ICD-10-CM | POA: Diagnosis not present

## 2016-07-12 DIAGNOSIS — E785 Hyperlipidemia, unspecified: Secondary | ICD-10-CM | POA: Diagnosis present

## 2016-07-12 DIAGNOSIS — Z7984 Long term (current) use of oral hypoglycemic drugs: Secondary | ICD-10-CM

## 2016-07-12 DIAGNOSIS — I5032 Chronic diastolic (congestive) heart failure: Secondary | ICD-10-CM | POA: Diagnosis present

## 2016-07-12 DIAGNOSIS — M47814 Spondylosis without myelopathy or radiculopathy, thoracic region: Secondary | ICD-10-CM | POA: Diagnosis not present

## 2016-07-12 DIAGNOSIS — E119 Type 2 diabetes mellitus without complications: Secondary | ICD-10-CM | POA: Insufficient documentation

## 2016-07-12 DIAGNOSIS — R0602 Shortness of breath: Secondary | ICD-10-CM | POA: Diagnosis not present

## 2016-07-12 DIAGNOSIS — K219 Gastro-esophageal reflux disease without esophagitis: Secondary | ICD-10-CM | POA: Diagnosis not present

## 2016-07-12 DIAGNOSIS — M545 Low back pain: Secondary | ICD-10-CM | POA: Diagnosis not present

## 2016-07-12 DIAGNOSIS — L0390 Cellulitis, unspecified: Secondary | ICD-10-CM | POA: Diagnosis present

## 2016-07-12 DIAGNOSIS — M79651 Pain in right thigh: Secondary | ICD-10-CM | POA: Diagnosis not present

## 2016-07-12 DIAGNOSIS — R079 Chest pain, unspecified: Secondary | ICD-10-CM | POA: Diagnosis not present

## 2016-07-12 DIAGNOSIS — Z79899 Other long term (current) drug therapy: Secondary | ICD-10-CM | POA: Insufficient documentation

## 2016-07-12 DIAGNOSIS — E039 Hypothyroidism, unspecified: Secondary | ICD-10-CM | POA: Insufficient documentation

## 2016-07-12 DIAGNOSIS — M4854XA Collapsed vertebra, not elsewhere classified, thoracic region, initial encounter for fracture: Secondary | ICD-10-CM | POA: Diagnosis not present

## 2016-07-12 DIAGNOSIS — I5030 Unspecified diastolic (congestive) heart failure: Secondary | ICD-10-CM | POA: Diagnosis present

## 2016-07-12 DIAGNOSIS — M79604 Pain in right leg: Secondary | ICD-10-CM | POA: Diagnosis not present

## 2016-07-12 LAB — COMPREHENSIVE METABOLIC PANEL
ALK PHOS: 74 U/L (ref 38–126)
ALT: 16 U/L (ref 14–54)
AST: 17 U/L (ref 15–41)
Albumin: 3.1 g/dL — ABNORMAL LOW (ref 3.5–5.0)
Anion gap: 5 (ref 5–15)
BUN: 20 mg/dL (ref 6–20)
CHLORIDE: 102 mmol/L (ref 101–111)
CO2: 28 mmol/L (ref 22–32)
CREATININE: 1.11 mg/dL — AB (ref 0.44–1.00)
Calcium: 8.8 mg/dL — ABNORMAL LOW (ref 8.9–10.3)
GFR calc Af Amer: 51 mL/min — ABNORMAL LOW (ref >60)
GFR, EST NON AFRICAN AMERICAN: 44 mL/min — AB (ref >60)
Glucose, Bld: 127 mg/dL — ABNORMAL HIGH (ref 65–99)
POTASSIUM: 4.4 mmol/L (ref 3.5–5.1)
Sodium: 135 mmol/L (ref 135–145)
Total Bilirubin: 0.3 mg/dL (ref 0.3–1.2)
Total Protein: 6.2 g/dL — ABNORMAL LOW (ref 6.5–8.1)

## 2016-07-12 LAB — TROPONIN I

## 2016-07-12 LAB — CBC WITH DIFFERENTIAL/PLATELET
Basophils Absolute: 0 10*3/uL (ref 0.0–0.1)
Basophils Relative: 0 %
EOS ABS: 0.3 10*3/uL (ref 0.0–0.7)
EOS PCT: 3 %
HCT: 30.6 % — ABNORMAL LOW (ref 36.0–46.0)
HEMOGLOBIN: 10.2 g/dL — AB (ref 12.0–15.0)
LYMPHS ABS: 1.4 10*3/uL (ref 0.7–4.0)
LYMPHS PCT: 19 %
MCH: 30.8 pg (ref 26.0–34.0)
MCHC: 33.3 g/dL (ref 30.0–36.0)
MCV: 92.4 fL (ref 78.0–100.0)
MONOS PCT: 11 %
Monocytes Absolute: 0.8 10*3/uL (ref 0.1–1.0)
NEUTROS PCT: 67 %
Neutro Abs: 5.2 10*3/uL (ref 1.7–7.7)
Platelets: 198 10*3/uL (ref 150–400)
RBC: 3.31 MIL/uL — AB (ref 3.87–5.11)
RDW: 13.4 % (ref 11.5–15.5)
WBC: 7.7 10*3/uL (ref 4.0–10.5)

## 2016-07-12 MED ORDER — HEPARIN SODIUM (PORCINE) 5000 UNIT/ML IJ SOLN: 5000.0000 [IU] | Freq: Three times a day (TID) | INTRAMUSCULAR | Status: DC

## 2016-07-12 MED ORDER — AMLODIPINE BESYLATE 5 MG PO TABS
5.0000 mg | ORAL_TABLET | Freq: Every day | ORAL | Status: DC
  Filled 2016-07-12: qty 1

## 2016-07-12 MED ORDER — POLYVINYL ALCOHOL 1.4 % OP SOLN: 1.0000 [drp] | Freq: Every day | OPHTHALMIC | Status: DC | PRN

## 2016-07-12 MED ORDER — INSULIN ASPART 100 UNIT/ML ~~LOC~~ SOLN: 0.0000 [IU] | Freq: Three times a day (TID) | SUBCUTANEOUS | Status: DC

## 2016-07-12 MED ORDER — FAMOTIDINE 20 MG PO TABS
20.0000 mg | ORAL_TABLET | Freq: Two times a day (BID) | ORAL | Status: DC
  Filled 2016-07-12: qty 1

## 2016-07-12 MED ORDER — ONDANSETRON HCL 4 MG/2ML IJ SOLN: 4.0000 mg | Freq: Four times a day (QID) | INTRAMUSCULAR | Status: DC | PRN

## 2016-07-12 MED ORDER — IOPAMIDOL (ISOVUE-300) INJECTION 61%
100.0000 mL | Freq: Once | INTRAVENOUS | Status: AC | PRN
  Administered 2016-07-12: 100 mL via INTRAVENOUS

## 2016-07-12 MED ORDER — CARVEDILOL 3.125 MG PO TABS
6.2500 mg | ORAL_TABLET | Freq: Two times a day (BID) | ORAL | Status: DC
  Filled 2016-07-12: qty 1

## 2016-07-12 MED ORDER — SODIUM CHLORIDE 0.9 % IV BOLUS (SEPSIS)
1000.0000 mL | Freq: Once | INTRAVENOUS | Status: AC
  Administered 2016-07-12: 1000 mL via INTRAVENOUS

## 2016-07-12 MED ORDER — INSULIN ASPART 100 UNIT/ML ~~LOC~~ SOLN: 0.0000 [IU] | Freq: Every day | SUBCUTANEOUS | Status: DC

## 2016-07-12 MED ORDER — LOSARTAN POTASSIUM 50 MG PO TABS
25.0000 mg | ORAL_TABLET | Freq: Every day | ORAL | Status: DC
  Filled 2016-07-12: qty 1

## 2016-07-12 MED ORDER — FENTANYL CITRATE (PF) 100 MCG/2ML IJ SOLN
50.0000 ug | Freq: Once | INTRAMUSCULAR | Status: AC
Start: 2016-07-12 — End: 2016-07-12
  Administered 2016-07-12: 50 ug via INTRAVENOUS
  Filled 2016-07-12: qty 2

## 2016-07-12 MED ORDER — DOCUSATE SODIUM 100 MG PO CAPS: 200.0000 mg | ORAL_CAPSULE | Freq: Every day | ORAL | Status: DC

## 2016-07-12 MED ORDER — ONDANSETRON HCL 4 MG PO TABS: 4.0000 mg | ORAL_TABLET | Freq: Four times a day (QID) | ORAL | Status: DC | PRN

## 2016-07-12 MED ORDER — FENTANYL CITRATE (PF) 100 MCG/2ML IJ SOLN
50.0000 ug | Freq: Once | INTRAMUSCULAR | Status: AC
Start: 2016-07-12 — End: 2016-07-12
  Administered 2016-07-12: 50 ug via INTRAVENOUS

## 2016-07-12 MED ORDER — FENTANYL CITRATE (PF) 100 MCG/2ML IJ SOLN
50.0000 ug | Freq: Once | INTRAMUSCULAR | Status: AC
  Administered 2016-07-12: 50 ug via INTRAVENOUS
  Filled 2016-07-12: qty 2

## 2016-07-12 MED ORDER — POTASSIUM CHLORIDE CRYS ER 10 MEQ PO TBCR
10.0000 meq | EXTENDED_RELEASE_TABLET | Freq: Every day | ORAL | Status: DC
  Filled 2016-07-12: qty 1

## 2016-07-12 MED ORDER — FENTANYL CITRATE (PF) 100 MCG/2ML IJ SOLN
INTRAMUSCULAR | Status: AC
  Filled 2016-07-12: qty 2

## 2016-07-12 MED ORDER — ACETAMINOPHEN 650 MG RE SUPP: 650.0000 mg | Freq: Four times a day (QID) | RECTAL | Status: DC | PRN

## 2016-07-12 MED ORDER — ALBUTEROL SULFATE (2.5 MG/3ML) 0.083% IN NEBU
2.5000 mg | INHALATION_SOLUTION | Freq: Two times a day (BID) | RESPIRATORY_TRACT | Status: DC
  Filled 2016-07-12: qty 3

## 2016-07-12 MED ORDER — DOXYCYCLINE HYCLATE 100 MG IV SOLR: 900.0000 mg | Freq: Two times a day (BID) | INTRAVENOUS | Status: DC

## 2016-07-12 MED ORDER — ACETAMINOPHEN 325 MG PO TABS: 650.0000 mg | ORAL_TABLET | Freq: Four times a day (QID) | ORAL | Status: DC | PRN

## 2016-07-12 MED ORDER — OCUVITE-LUTEIN PO CAPS
1.0000 | ORAL_CAPSULE | Freq: Two times a day (BID) | ORAL | Status: DC
  Filled 2016-07-12: qty 1

## 2016-07-12 MED ORDER — TRAMADOL HCL 50 MG PO TABS
50.0000 mg | ORAL_TABLET | Freq: Four times a day (QID) | ORAL | Status: DC | PRN
  Filled 2016-07-12: qty 1

## 2016-07-12 MED ORDER — LABETALOL HCL 5 MG/ML IV SOLN
20.0000 mg | Freq: Once | INTRAVENOUS | Status: AC
  Administered 2016-07-12: 20 mg via INTRAVENOUS
  Filled 2016-07-12: qty 4

## 2016-07-12 MED ORDER — FUROSEMIDE 40 MG PO TABS: 40.0000 mg | ORAL_TABLET | ORAL | Status: DC | PRN

## 2016-07-12 MED ORDER — ALBUTEROL SULFATE (2.5 MG/3ML) 0.083% IN NEBU: 2.5000 mg | INHALATION_SOLUTION | RESPIRATORY_TRACT | Status: DC | PRN

## 2016-07-12 MED ORDER — LEVOTHYROXINE SODIUM 50 MCG PO TABS: 50.0000 ug | ORAL_TABLET | Freq: Every day | ORAL | Status: DC

## 2016-07-12 MED ORDER — CLINDAMYCIN PHOSPHATE 900 MG/50ML IV SOLN
900.0000 mg | Freq: Three times a day (TID) | INTRAVENOUS | Status: DC
  Filled 2016-07-12 (×10): qty 50

## 2016-07-12 MED ORDER — HYDRALAZINE HCL 20 MG/ML IJ SOLN: 10.0000 mg | Freq: Four times a day (QID) | INTRAMUSCULAR | Status: DC | PRN

## 2016-07-12 MED ORDER — CLINDAMYCIN PHOSPHATE 900 MG/50ML IV SOLN
900.0000 mg | Freq: Once | INTRAVENOUS | Status: AC
  Administered 2016-07-12: 900 mg via INTRAVENOUS
  Filled 2016-07-12: qty 50

## 2016-07-12 MED ORDER — ATORVASTATIN CALCIUM 10 MG PO TABS: 10.0000 mg | ORAL_TABLET | Freq: Every day | ORAL | Status: DC

## 2016-07-12 MED ORDER — MOMETASONE FURO-FORMOTEROL FUM 200-5 MCG/ACT IN AERO
2.0000 | INHALATION_SPRAY | Freq: Two times a day (BID) | RESPIRATORY_TRACT | Status: DC
  Filled 2016-07-12: qty 8.8

## 2016-07-12 MED ORDER — CALCIUM POLYCARBOPHIL 625 MG PO TABS
1250.0000 mg | ORAL_TABLET | Freq: Every day | ORAL | Status: DC
Start: 2016-07-12 — End: 2016-07-12
  Filled 2016-07-12 (×4): qty 2

## 2016-07-12 NOTE — ED Notes (Signed)
Mark area to right thigh with slight redness noted outside of markings made on Wednesday.  Dr Rolland Porter informed.

## 2016-07-12 NOTE — ED Notes (Signed)
Pt assisted to chair. "I'll feel better sitting up."

## 2016-07-12 NOTE — ED Triage Notes (Signed)
Pt c/o rt leg pain and was seen by pcp for the same a couple of days ago. Pt was diagnosed with skin infection. Pt has redness to the right upper thigh.

## 2016-07-12 NOTE — Progress Notes (Signed)
Pt rating pain to right upper anterior leg where cellulitis is present 7 and requesting pain med. Walker now at bedside and extra pillow given.

## 2016-07-12 NOTE — Discharge Summary (Signed)
AMA  Soon after admission patient and family had a conversation with social work/case management, they were informed about observation status which she qualifies for, patient and family at that time chose to leave AMA.    Lala Lund M.D on 07/12/2016 at 10:25 AM  [facility]  Time < 30 minutes  Last Note Below                                                                                                                                                                                                                                                                                         TRH H&P   Patient Demographics:    Sandra Cuevas, is a 82 y.o. female  MRN: [id number]   DOB - 21-Mar-1929  Admit Date - 07/12/2016  Outpatient Primary MD for the patient is Purvis Kilts, MD  Outpatient Specialists: Dr Lamonte Sakai    Patient coming from: Home  Chief Complaint  Patient presents with  . Leg Pain      HPI:    Sandra Cuevas  is a 83 y.o. female, With history of hypertension, hypothyroidism, dyslipidemia,  GERD, hoarse voice was brought in by family members due to right thigh small cellulitis about 2 cm in diameter, also some pain in that area, patient also has mid back pain for the last several days after she lifted a suitcase at home, in the ER she was diagnosed with mild right leg still like to send For observation overnight. She was accepted by the night physician upon ED physician's request.  This morning when I examined the patient she appeared to be in mild upper back pain, she had a hoarse voice for which she has a pending appointment with ENT, she has already been seen by pulmonologist Dr. Kyung Rudd for her hoarse voice, she says  she was recently checked in [facility] by orthopedic surgeon for it hip pain and had an MRI which according  to family members was nonacute, she denies any subjective fevers or chills, no cough or shortness of breath, no abdominal pain or diarrhea. No dysuria or focal weakness.    Review of systems:    In addition to the HPI above,   No Fever-chills, No Headache, No changes with Vision or hearing, No problems swallowing food or Liquids, No Chest pain, Cough or Shortness of Breath, No Abdominal pain, No Nausea or Vommitting, Bowel movements are regular, No Blood in stool or Urine, No dysuria, No new skin rashes or bruises, No new joints pains-aches, Except positive mid upper back pain, worse with movement better with rest, nonradiating, ongoing for several days, no associated symptoms No new weakness, tingling, numbness in any extremity, No recent weight gain or loss, No polyuria, polydypsia or polyphagia, No significant Mental Stressors.  A full 10 point Review of Systems was done, except as stated above, all other Review of Systems were negative.   With Past History of the following :    Past Medical History:  Diagnosis Date  . Atypical chest pain   . 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 Diabetes mellitus   . GERD (gastroesophageal reflux disease)   . History of cardiovascular stress test 06/01/2010   EF 71% - no evidence of ischemia, normal left ventricular systolic function  . History of hysterectomy 1965  . Hyperlipidemia   . Hypertension   . Hypothyroidism   . Shortness of breath    on exertion      Past Surgical History:  Procedure Laterality Date  . ABDOMINAL HYSTERECTOMY    . CARDIAC SURGERY     catherization  . HEMORRHOID SURGERY        Social History:     Social History  Substance Use Topics  . Smoking status: Never Smoker  . Smokeless tobacco: Never Used  . Alcohol use No         Family History :     Family History  Problem Relation Age of Onset  . Heart disease Father     heart problems  . Kidney failure Mother   . Diabetes  Mother   . Cancer Brother   . Diabetes Brother         Home Medications:   Prior to Admission medications   Medication Sig Start Date End Date Taking? Authorizing Provider  acetaminophen (TYLENOL) 500 MG tablet Take 2 tablets (1,000 mg total) by mouth every 6 (six) hours as needed. 07/05/16   Charlesetta Shanks, MD  albuterol (PROVENTIL HFA;VENTOLIN HFA) 108 (90 BASE) MCG/ACT inhaler Inhale 2 puffs into the lungs 2 (two) times daily. 02/25/13 09/04/16  Shanker Kristeen Mans, MD  albuterol (PROVENTIL) (5 MG/ML) 0.5% nebulizer solution Take 0.5 mLs (2.5 mg total) by nebulization every 4 (four) hours as needed for wheezing or shortness of breath. 02/25/13   Shanker Kristeen Mans, MD  amLODipine (NORVASC) 5 MG tablet Take 1 tablet (5 mg total) by mouth daily. 07/05/16   Charlesetta Shanks, MD  atorvastatin (LIPITOR) 10 MG tablet Take 10 mg by mouth Daily.  07/13/10   Historical Provider, MD  budesonide-formoterol (SYMBICORT) 160-4.5 MCG/ACT inhaler Inhale 2 puffs into the lungs 2 (two) times daily. Patient not taking: Reported on 07/05/2016 05/25/15   Juanito Doom, MD  dexlansoprazole (DEXILANT) 60 MG capsule Take 60 mg by mouth  2 (two) times daily.     Historical Provider, MD  docusate sodium (COLACE) 100 MG capsule Take 200 mg by mouth at bedtime.    Historical Provider, MD  famotidine (PEPCID) 20 MG tablet Take 1 tablet (20 mg total) by mouth 2 (two) times daily. 07/05/16   Charlesetta Shanks, MD  furosemide (LASIX) 40 MG tablet Take 40 mg by mouth as needed for fluid or edema.     Historical Provider, MD  levothyroxine (SYNTHROID, LEVOTHROID) 50 MCG tablet Take 50 mcg by mouth daily.      Historical Provider, MD  losartan (COZAAR) 50 MG tablet Once daily 01/26/14   Historical Provider, MD  metFORMIN (GLUCOPHAGE) 500 MG tablet Take 1 tablet (500 mg total) by mouth 2 (two) times daily with a meal. Patient taking differently: Take 500 mg by mouth daily as needed (high blood sugar).  02/25/13   Shanker Kristeen Mans, MD    Multiple Vitamins-Minerals (OCUVITE EYE + MULTI) TABS Take by mouth 2 (two) times daily.    Historical Provider, MD  polycarbophil (FIBERCON) 625 MG tablet Take 1,250 mg by mouth daily.     Historical Provider, MD  Polyvinyl Alcohol-Povidone (MURINE TEARS FOR DRY EYES) 5-6 MG/ML SOLN Place 1 drop into both eyes daily as needed (Dry Eyes).    Historical Provider, MD  potassium chloride (MICRO-K) 10 MEQ CR capsule Take 10 mEq by mouth daily.  08/15/10   Historical Provider, MD  traMADol (ULTRAM) 50 MG tablet Take 1 tablet (50 mg total) by mouth every 6 (six) hours as needed. 07/05/16   Charlesetta Shanks, MD     Allergies:    No Known Allergies   Physical Exam:   Vitals  Blood pressure 110/84, pulse (!) 52, temperature 97.6 F (36.4 C), temperature source Oral, resp. rate 18, SpO2 96 %.   1. General Obese elderly white female lying in bed in mild upper back pain,  2. Normal affect and insight, Not Suicidal or Homicidal, Awake Alert, Oriented X 3.  3. No F.N deficits, ALL C.Nerves Intact, Strength 5/5 all 4 extremities, Sensation intact all 4 extremities, Plantars down going.  4. Ears and Eyes appear Normal, Conjunctivae clear, PERRLA. Moist Oral Mucosa.  5. Supple Neck, No JVD, No cervical lymphadenopathy appriciated, No Carotid Bruits.  6. Symmetrical Chest wall movement, Good air movement bilaterally, CTAB.  7. RRR, No Gallops, Rubs or Murmurs, No Parasternal Heave.  8. Positive Bowel Sounds, Abdomen Soft, No tenderness, No organomegaly appriciated,No rebound -guarding or rigidity.  9.  No Cyanosis, Normal Skin Turgor, No Skin Rash or Bruise. Right thigh has 2 cm in diameter mild erythema, no surrounding warmth, no fluctuance  10. Good muscle tone,  joints appear normal , no effusions, Normal ROM.  11. No Palpable Lymph Nodes in Neck or Axillae      Data Review:    CBC  Recent Labs Lab 07/05/16 1158 07/12/16 0403  WBC 7.2 7.7  HGB 10.4* 10.2*  HCT 31.9* 30.6*  PLT 199  198  MCV 93.3 92.4  MCH 30.4 30.8  MCHC 32.6 33.3  RDW 13.8 13.4  LYMPHSABS  --  1.4  MONOABS  --  0.8  EOSABS  --  0.3  BASOSABS  --  0.0   ------------------------------------------------------------------------------------------------------------------  Chemistries   Recent Labs Lab 07/05/16 1158 07/05/16 1320 07/12/16 0403  NA 137  --  135  K 4.3  --  4.4  CL 103  --  102  CO2 27  --  28  GLUCOSE 112*  --  127*  BUN 14  --  20  CREATININE 1.04*  --  1.11*  CALCIUM 9.0  --  8.8*  AST  --  17 17  ALT  --  14 16  ALKPHOS  --  72 74  BILITOT  --  0.6 0.3   ------------------------------------------------------------------------------------------------------------------ estimated creatinine clearance is 41.8 mL/min (A) (by C-G formula based on SCr of 1.11 mg/dL (H)). ------------------------------------------------------------------------------------------------------------------ No results for input(s): TSH, T4TOTAL, T3FREE, THYROIDAB in the last 72 hours.  Invalid input(s): FREET3  Coagulation profile No results for input(s): INR, PROTIME in the last 168 hours. ------------------------------------------------------------------------------------------------------------------- No results for input(s): DDIMER in the last 72 hours. -------------------------------------------------------------------------------------------------------------------  Cardiac Enzymes  Recent Labs Lab 07/12/16 0803  TROPONINI <0.03   ------------------------------------------------------------------------------------------------------------------ No results found for: BNP   ---------------------------------------------------------------------------------------------------------------  Urinalysis    Component Value Date/Time   COLORURINE YELLOW 12/28/2009 0622   APPEARANCEUR CLOUDY (A) 12/28/2009 0622   LABSPEC 1.021 12/28/2009 0622   PHURINE 5.0 12/28/2009 0622   GLUCOSEU  NEGATIVE 12/28/2009 0622   HGBUR NEGATIVE 12/28/2009 0622   BILIRUBINUR NEGATIVE 12/28/2009 0622   KETONESUR NEGATIVE 12/28/2009 0622   PROTEINUR NEGATIVE 12/28/2009 0622   UROBILINOGEN 0.2 12/28/2009 0622   NITRITE NEGATIVE 12/28/2009 0622   LEUKOCYTESUR MODERATE (A) 12/28/2009 0622    ----------------------------------------------------------------------------------------------------------------   Imaging Results:    Ct Thoracic Spine Wo Contrast  Result Date: 07/12/2016 CLINICAL DATA:  Severe mid thoracic and chest pain for 1 week. No reported acute injury. History of diabetes, gastroesophageal reflux and uterine cancer. EXAM: CT THORACIC SPINE WITHOUT CONTRAST TECHNIQUE: Multidetector CT images of the thoracic were obtained using the standard protocol without intravenous contrast. COMPARISON:  Chest radiographs 07/05/2016 and 10/09/2013. Chest CT 02/17/2013 and 07/05/2016. FINDINGS: Alignment: Near normal.  There is a minimal convex right scoliosis. Vertebrae: 12 rib-bearing thoracic type vertebral bodies. There is a mild superior endplate compression deformity/Schmorl's node at T4. This appears new from 2014 CT and radiographs of 2015, although is stable from the studies of last week. No other fractures are seen. There is no focal lytic or blastic lesion. Paraspinal and other soft tissues: No evidence of paraspinal hematoma. There is no significant soft tissue stranding around the T4 vertebral body. Previous right shoulder arthroplasty noted. There is extensive aortic and great vessel atherosclerosis. Emphysema and bibasilar atelectasis are noted. There are no suspicious pulmonary findings. Disc levels: Mild degenerative changes are present throughout the cervicothoracic spine. There is mild right foraminal narrowing at T1-2. There are calcified disc protrusions with covering osteophytes centrally at T6-7 and T9-10. No significant spinal stenosis or large soft disc herniation identified.  IMPRESSION: 1. Mild acute/subacute superior endplate compression deformity/Schmorl's node at T4, stable from last week but new from 2015 radiographs. 2. No new osseous findings. 3. Mild spondylosis. No large disc herniation or significant spinal stenosis demonstrated. 4. Aortic atherosclerosis. Electronically Signed   By: Richardean Sale M.D.   On: 07/12/2016 09:13   Dg Chest Port 1 View  Result Date: 07/12/2016 CLINICAL DATA:  Chest pain and hypertension EXAM: PORTABLE CHEST 1 VIEW COMPARISON:  July 05, 2016 FINDINGS: There is no edema or consolidation. There is mild cardiomegaly with pulmonary vascularity within normal limits. No adenopathy. There is aortic atherosclerosis. There is a total shoulder prosthesis on the right. IMPRESSION: Stable cardiomegaly. No edema or consolidation. There is aortic atherosclerosis. Electronically Signed   By: Lowella Grip III M.D.   On: 07/12/2016 08:30   Ct Extrem Lower  W Cm Bil  Result Date: 07/12/2016 CLINICAL DATA:  Right leg pain and erythema. EXAM: CT OF THE LOWER BILATERAL EXTREMITY WITH CONTRAST TECHNIQUE: Multidetector CT imaging of the lower bilateral extremity was performed according to the standard protocol following intravenous contrast administration. COMPARISON:  None. CONTRAST:  178mL ISOVUE-300 IOPAMIDOL (ISOVUE-300) INJECTION 61% FINDINGS: No fracture or other acute bone abnormality. No bone lesion or bony destruction. There is mild skin thickening and slight subcutaneous stranding opacity in the medial right by, series 3 image 126-162. This is consistent with cellulitis. No abscess or drainable collection. No soft tissue gas. No significant muscular abnormality. IMPRESSION: Skin thickening and mild subcutaneous stranding consistent with cellulitis. No abscess or drainable collection. No bone or muscular abnormality. Electronically Signed   By: Andreas Newport M.D.   On: 07/12/2016 05:47    My personal review of EKG: Rhythm NSR, no Acute ST  changes   Assessment & Plan:     1. Minimal right thigh cellulitis. Place on clindamycin, she has no fevers or leukocytosis, we'll keep her in 23 are observation thereafter follow with PCP. CT soft tissue right leg unremarkable.  2. Mid upper back pain ongoing for several days after she picked up a suitcase. I suspect she either has a sprained muscle or a fracture in either posterior ribs or T-spine. No focal deficits, low paresthesia or remedy weakness. Supportive care for now.  3. Hypothyroidism. Continue home dose Synthroid.  4. History of hoarse voice. Had extensive workup by Dr. Lamonte Sakai pulmonary, has pending appointment with ENT locally, requested to keep that appointment. No acute issues.  5. DM type II. Sliding scale for now.  6. HTN. Home medications continued added Coreg and as needed IV hydralazine.    DVT Prophylaxis Heparin    AM Labs Ordered, also please review Full Orders  Family Communication: Admission, patients condition and plan of care including tests being ordered have been discussed with the patient and family who indicate understanding and agree with the plan and Code Status.  Code Status Full  Likely DC to  Home 1-2 days  Condition Fair  Consults called: None    Admission status: Obs    Time spent in minutes : 35   Lala Lund M.D on 07/12/2016 at 10:25 AM  Between 7am to 7pm - Pager - [phone] ( page via [facility], text pages only, please mention full 10 digit call back number).  After 7pm go to [email or web address] - password [facility]  [facility] - Office  [phone]

## 2016-07-12 NOTE — H&P (Signed)
TRH H&P   Patient Demographics:    Sandra Cuevas, is a 83 y.o. female  MRN: [id number]   DOB - [birth year]  Admit Date - 07/12/2016  Outpatient Primary MD for the patient is Purvis Kilts, MD  Outpatient Specialists: Dr Lamonte Sakai    Patient coming from: Home  Chief Complaint  Patient presents with  . Leg Pain      HPI:    Sandra Cuevas  is a 83 y.o. female, With history of hypertension, hypothyroidism, dyslipidemia,  GERD, hoarse voice was brought in by family members due to right thigh small cellulitis about 2 cm in diameter, also some pain in that area, patient also has mid back pain for the last several days after she lifted a suitcase at home, in the ER she was diagnosed with mild right leg still like to send For observation overnight. She was accepted by the night physician upon ED physician's request.  This morning when I examined the patient she appeared to be in mild upper back pain, she had a hoarse voice for which she has a pending appointment with ENT, she has already been seen by pulmonologist Dr. Kyung Rudd for her hoarse voice, she says she was recently checked in [facility] by orthopedic surgeon for it hip pain and had an MRI which according to family members was nonacute, she denies any subjective fevers or chills, no cough or shortness of breath, no abdominal pain or diarrhea. No dysuria or focal weakness.    Review of systems:    In addition to the HPI above,   No Fever-chills, No Headache, No changes with Vision or hearing, No problems swallowing food or Liquids, No Chest pain, Cough or Shortness of Breath, No Abdominal pain, No Nausea or Vommitting, Bowel movements are regular, No Blood in stool or  Urine, No dysuria, No new skin rashes or bruises, No new joints pains-aches, Except positive mid upper back pain, worse with movement better with rest, nonradiating, ongoing for several days, no associated symptoms No new weakness, tingling, numbness in any extremity, No recent weight gain or loss, No polyuria, polydypsia or polyphagia, No significant Mental Stressors.  A full 10 point Review of Systems was done, except as stated above, all other Review of Systems were negative.   With Past History of the following :    Past  Medical History:  Diagnosis Date  . Atypical chest pain   . 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 Diabetes mellitus   . GERD (gastroesophageal reflux disease)   . History of cardiovascular stress test 06/01/2010   EF 71% - no evidence of ischemia, normal left ventricular systolic function  . History of hysterectomy 1965  . Hyperlipidemia   . Hypertension   . Hypothyroidism   . Shortness of breath    on exertion      Past Surgical History:  Procedure Laterality Date  . ABDOMINAL HYSTERECTOMY    . CARDIAC SURGERY     catherization  . HEMORRHOID SURGERY        Social History:     Social History  Substance Use Topics  . Smoking status: Never Smoker  . Smokeless tobacco: Never Used  . Alcohol use No         Family History :     Family History  Problem Relation Age of Onset  . Heart disease Father     heart problems  . Kidney failure Mother   . Diabetes Mother   . Cancer Brother   . Diabetes Brother         Home Medications:   Prior to Admission medications   Medication Sig Start Date End Date Taking? Authorizing Provider  acetaminophen (TYLENOL) 500 MG tablet Take 2 tablets (1,000 mg total) by mouth every 6 (six) hours as needed. 07/05/16   Charlesetta Shanks, MD  albuterol (PROVENTIL HFA;VENTOLIN HFA) 108 (90 BASE) MCG/ACT inhaler Inhale 2 puffs into the lungs 2 (two) times daily. 02/25/13 09/04/16  Shanker Kristeen Mans,  MD  albuterol (PROVENTIL) (5 MG/ML) 0.5% nebulizer solution Take 0.5 mLs (2.5 mg total) by nebulization every 4 (four) hours as needed for wheezing or shortness of breath. 02/25/13   Shanker Kristeen Mans, MD  amLODipine (NORVASC) 5 MG tablet Take 1 tablet (5 mg total) by mouth daily. 07/05/16   Charlesetta Shanks, MD  atorvastatin (LIPITOR) 10 MG tablet Take 10 mg by mouth Daily.  07/13/10   Historical Provider, MD  budesonide-formoterol (SYMBICORT) 160-4.5 MCG/ACT inhaler Inhale 2 puffs into the lungs 2 (two) times daily. Patient not taking: Reported on 07/05/2016 05/25/15   Juanito Doom, MD  dexlansoprazole (DEXILANT) 60 MG capsule Take 60 mg by mouth 2 (two) times daily.     Historical Provider, MD  docusate sodium (COLACE) 100 MG capsule Take 200 mg by mouth at bedtime.    Historical Provider, MD  famotidine (PEPCID) 20 MG tablet Take 1 tablet (20 mg total) by mouth 2 (two) times daily. 07/05/16   Charlesetta Shanks, MD  furosemide (LASIX) 40 MG tablet Take 40 mg by mouth as needed for fluid or edema.     Historical Provider, MD  levothyroxine (SYNTHROID, LEVOTHROID) 50 MCG tablet Take 50 mcg by mouth daily.      Historical Provider, MD  losartan (COZAAR) 50 MG tablet Once daily 01/26/14   Historical Provider, MD  metFORMIN (GLUCOPHAGE) 500 MG tablet Take 1 tablet (500 mg total) by mouth 2 (two) times daily with a meal. Patient taking differently: Take 500 mg by mouth daily as needed (high blood sugar).  02/25/13   Shanker Kristeen Mans, MD  Multiple Vitamins-Minerals (OCUVITE EYE + MULTI) TABS Take by mouth 2 (two) times daily.    Historical Provider, MD  polycarbophil (FIBERCON) 625 MG tablet Take 1,250 mg by mouth daily.     Historical  Provider, MD  Polyvinyl Alcohol-Povidone (MURINE TEARS FOR DRY EYES) 5-6 MG/ML SOLN Place 1 drop into both eyes daily as needed (Dry Eyes).    Historical Provider, MD  potassium chloride (MICRO-K) 10 MEQ CR capsule Take 10 mEq by mouth daily.  08/15/10   Historical Provider, MD    traMADol (ULTRAM) 50 MG tablet Take 1 tablet (50 mg total) by mouth every 6 (six) hours as needed. 07/05/16   Charlesetta Shanks, MD     Allergies:    No Known Allergies   Physical Exam:   Vitals  Blood pressure 110/84, pulse (!) 52, temperature 97.6 F (36.4 C), temperature source Oral, resp. rate 18, SpO2 96 %.   1. General Obese elderly white female lying in bed in mild upper back pain,  2. Normal affect and insight, Not Suicidal or Homicidal, Awake Alert, Oriented X 3.  3. No F.N deficits, ALL C.Nerves Intact, Strength 5/5 all 4 extremities, Sensation intact all 4 extremities, Plantars down going.  4. Ears and Eyes appear Normal, Conjunctivae clear, PERRLA. Moist Oral Mucosa.  5. Supple Neck, No JVD, No cervical lymphadenopathy appriciated, No Carotid Bruits.  6. Symmetrical Chest wall movement, Good air movement bilaterally, CTAB.  7. RRR, No Gallops, Rubs or Murmurs, No Parasternal Heave.  8. Positive Bowel Sounds, Abdomen Soft, No tenderness, No organomegaly appriciated,No rebound -guarding or rigidity.  9.  No Cyanosis, Normal Skin Turgor, No Skin Rash or Bruise. Right thigh has 2 cm in diameter mild erythema, no surrounding warmth, no fluctuance  10. Good muscle tone,  joints appear normal , no effusions, Normal ROM.  11. No Palpable Lymph Nodes in Neck or Axillae      Data Review:    CBC  Recent Labs Lab 07/05/16 1158 07/12/16 0403  WBC 7.2 7.7  HGB 10.4* 10.2*  HCT 31.9* 30.6*  PLT 199 198  MCV 93.3 92.4  MCH 30.4 30.8  MCHC 32.6 33.3  RDW 13.8 13.4  LYMPHSABS  --  1.4  MONOABS  --  0.8  EOSABS  --  0.3  BASOSABS  --  0.0   ------------------------------------------------------------------------------------------------------------------  Chemistries   Recent Labs Lab 07/05/16 1158 07/05/16 1320 07/12/16 0403  NA 137  --  135  K 4.3  --  4.4  CL 103  --  102  CO2 27  --  28  GLUCOSE 112*  --  127*  BUN 14  --  20  CREATININE 1.04*  --   1.11*  CALCIUM 9.0  --  8.8*  AST  --  17 17  ALT  --  14 16  ALKPHOS  --  72 74  BILITOT  --  0.6 0.3   ------------------------------------------------------------------------------------------------------------------ estimated creatinine clearance is 41.8 mL/min (A) (by C-G formula based on SCr of 1.11 mg/dL (H)). ------------------------------------------------------------------------------------------------------------------ No results for input(s): TSH, T4TOTAL, T3FREE, THYROIDAB in the last 72 hours.  Invalid input(s): FREET3  Coagulation profile No results for input(s): INR, PROTIME in the last 168 hours. ------------------------------------------------------------------------------------------------------------------- No results for input(s): DDIMER in the last 72 hours. -------------------------------------------------------------------------------------------------------------------  Cardiac Enzymes  Recent Labs Lab 07/12/16 0803  TROPONINI <0.03   ------------------------------------------------------------------------------------------------------------------ No results found for: BNP   ---------------------------------------------------------------------------------------------------------------  Urinalysis    Component Value Date/Time   COLORURINE YELLOW 12/28/2009 0622   APPEARANCEUR CLOUDY (A) 12/28/2009 0622   LABSPEC 1.021 12/28/2009 0622   PHURINE 5.0 12/28/2009 0622   GLUCOSEU NEGATIVE 12/28/2009 0622   HGBUR NEGATIVE 12/28/2009 0622   [place] NEGATIVE 12/28/2009 0622  KETONESUR NEGATIVE 12/28/2009 0622   PROTEINUR NEGATIVE 12/28/2009 0622   UROBILINOGEN 0.2 12/28/2009 0622   NITRITE NEGATIVE 12/28/2009 0622   LEUKOCYTESUR MODERATE (A) 12/28/2009 0622    ----------------------------------------------------------------------------------------------------------------   Imaging Results:    Dg Chest Port 1 View  Result Date:  07/12/2016 CLINICAL DATA:  Chest pain and hypertension EXAM: PORTABLE CHEST 1 VIEW COMPARISON:  July 05, 2016 FINDINGS: There is no edema or consolidation. There is mild cardiomegaly with pulmonary vascularity within normal limits. No adenopathy. There is aortic atherosclerosis. There is a total shoulder prosthesis on the right. IMPRESSION: Stable cardiomegaly. No edema or consolidation. There is aortic atherosclerosis. Electronically Signed   By: Lowella Grip III M.D.   On: 07/12/2016 08:30   Ct Extrem Lower W Cm Bil  Result Date: 07/12/2016 CLINICAL DATA:  Right leg pain and erythema. EXAM: CT OF THE LOWER BILATERAL EXTREMITY WITH CONTRAST TECHNIQUE: Multidetector CT imaging of the lower bilateral extremity was performed according to the standard protocol following intravenous contrast administration. COMPARISON:  None. CONTRAST:  164mL ISOVUE-300 IOPAMIDOL (ISOVUE-300) INJECTION 61% FINDINGS: No fracture or other acute bone abnormality. No bone lesion or bony destruction. There is mild skin thickening and slight subcutaneous stranding opacity in the medial right by, series 3 image 126-162. This is consistent with cellulitis. No abscess or drainable collection. No soft tissue gas. No significant muscular abnormality. IMPRESSION: Skin thickening and mild subcutaneous stranding consistent with cellulitis. No abscess or drainable collection. No bone or muscular abnormality. Electronically Signed   By: Andreas Newport M.D.   On: 07/12/2016 05:47    My personal review of EKG: Rhythm NSR, no Acute ST changes   Assessment & Plan:     1. Minimal right thigh cellulitis. Place on clindamycin, she has no fevers or leukocytosis, we'll keep her in 23 are observation thereafter follow with PCP. CT soft tissue right leg unremarkable.  2. Mid upper back pain ongoing for several days after she picked up a suitcase. I suspect she either has a sprained muscle or a fracture in either posterior ribs or T-spine. No  focal deficits, low paresthesia or remedy weakness. Supportive care for now.  3. Hypothyroidism. Continue home dose Synthroid.  4. History of hoarse voice. Had extensive workup by Dr. Lamonte Sakai pulmonary, has pending appointment with ENT locally, requested to keep that appointment. No acute issues.  5. DM type II. Sliding scale for now.  6. HTN. Home medications continued added Coreg and as needed IV hydralazine.    DVT Prophylaxis Heparin    AM Labs Ordered, also please review Full Orders  Family Communication: Admission, patients condition and plan of care including tests being ordered have been discussed with the patient and family who indicate understanding and agree with the plan and Code Status.  Code Status Full  Likely DC to  Home 1-2 days  Condition Fair  Consults called: None    Admission status: Obs    Time spent in minutes : 35   Lala Lund M.D on 07/12/2016 at 9:12 AM  Between 7am to 7pm - Pager - [phone] ( page via [facility], text pages only, please mention full 10 digit call back number).  After 7pm go to [email or web address] - password [facility]  [facility] - Office  [phone]

## 2016-07-12 NOTE — ED Provider Notes (Signed)
[facility] DEPT Provider Note   CSN: [id number] Arrival date & time: 07/12/16  0305  Time seen 03:40 AM   History   Chief Complaint Chief Complaint  Patient presents with  . Leg Pain    HPI Sandra Cuevas is a 83 y.o. female.  HPI  Pt reports she started having pain in her right thigh around April 2. Her daughter states she looked at it the next day and there was no redness or raised areas, but she could feel a knot under the skin. She was seen by her PCP on the 4th when it got red and received an injection of antibiotics and oral antibiotics which she did not bring to the ED and cannot recall the name of. She reports it isn't getting better. She denies any fever or chills. She denies any injury, including any insect bites.    PCP Purvis Kilts, MD   Past Medical History:  Diagnosis Date  . Atypical chest pain   . 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 Diabetes mellitus   . GERD (gastroesophageal reflux disease)   . History of cardiovascular stress test 06/01/2010   EF 71% - no evidence of ischemia, normal left ventricular systolic function  . History of hysterectomy 1965  . Hyperlipidemia   . Hypertension   . Hypothyroidism   . Shortness of breath    on exertion    Patient Active Problem List   Diagnosis Date Noted  . Cellulitis 07/12/2016  . Chronic diastolic CHF (congestive heart failure) ([place]) 04/30/2016  . Hoarse voice quality 09/05/2015  . Chronic obstructive airway disease with asthma ([place]) 04/20/2013  . Snoring 04/20/2013  . Premature ventricular contractions 03/23/2013  . Acute diastolic heart failure ([place]) 02/18/2013  . Change in stool 01/06/2012  . Shortness of breath 09/26/2011  . Carpal tunnel syndrome 11/06/2010  . Hyperlipidemia   . Atypical chest pain   . Hypothyroidism   . Diabetes mellitus   . DM 02/12/2010  . EXTERNAL HEMORRHOIDS WITHOUT MENTION COMP 02/12/2010  . RECTAL BLEEDING 02/12/2010  . CERVICAL CANCER  10/18/2009  . HYPERLIPIDEMIA 10/18/2009  . Benign essential HTN 10/18/2009  . Cough 10/18/2009    Past Surgical History:  Procedure Laterality Date  . ABDOMINAL HYSTERECTOMY    . CARDIAC SURGERY     catherization  . HEMORRHOID SURGERY      OB History    No data available       Home Medications    Prior to Admission medications   Medication Sig Start Date End Date Taking? Authorizing Provider  acetaminophen (TYLENOL) 500 MG tablet Take 2 tablets (1,000 mg total) by mouth every 6 (six) hours as needed. 07/05/16   Charlesetta Shanks, MD  albuterol (PROVENTIL HFA;VENTOLIN HFA) 108 (90 BASE) MCG/ACT inhaler Inhale 2 puffs into the lungs 2 (two) times daily. 02/25/13 09/04/16  Shanker Kristeen Mans, MD  albuterol (PROVENTIL) (5 MG/ML) 0.5% nebulizer solution Take 0.5 mLs (2.5 mg total) by nebulization every 4 (four) hours as needed for wheezing or shortness of breath. 02/25/13   Shanker Kristeen Mans, MD  amLODipine (NORVASC) 5 MG tablet Take 1 tablet (5 mg total) by mouth daily. 07/05/16   Charlesetta Shanks, MD  atorvastatin (LIPITOR) 10 MG tablet Take 10 mg by mouth Daily.  07/13/10   Historical Provider, MD  budesonide-formoterol (SYMBICORT) 160-4.5 MCG/ACT inhaler Inhale 2 puffs into the lungs 2 (two) times daily. Patient not taking: Reported on 07/05/2016 05/25/15  Juanito Doom, MD  dexlansoprazole (DEXILANT) 60 MG capsule Take 60 mg by mouth 2 (two) times daily.     Historical Provider, MD  docusate sodium (COLACE) 100 MG capsule Take 200 mg by mouth at bedtime.    Historical Provider, MD  famotidine (PEPCID) 20 MG tablet Take 1 tablet (20 mg total) by mouth 2 (two) times daily. 07/05/16   Charlesetta Shanks, MD  furosemide (LASIX) 40 MG tablet Take 40 mg by mouth as needed for fluid or edema.     Historical Provider, MD  levothyroxine (SYNTHROID, LEVOTHROID) 50 MCG tablet Take 50 mcg by mouth daily.      Historical Provider, MD  losartan (COZAAR) 50 MG tablet Once daily 01/26/14   Historical  Provider, MD  metFORMIN (GLUCOPHAGE) 500 MG tablet Take 1 tablet (500 mg total) by mouth 2 (two) times daily with a meal. Patient taking differently: Take 500 mg by mouth daily as needed (high blood sugar).  02/25/13   Shanker Kristeen Mans, MD  Multiple Vitamins-Minerals (OCUVITE EYE + MULTI) TABS Take by mouth 2 (two) times daily.    Historical Provider, MD  polycarbophil (FIBERCON) 625 MG tablet Take 1,250 mg by mouth daily.     Historical Provider, MD  Polyvinyl Alcohol-Povidone (MURINE TEARS FOR DRY EYES) 5-6 MG/ML SOLN Place 1 drop into both eyes daily as needed (Dry Eyes).    Historical Provider, MD  potassium chloride (MICRO-K) 10 MEQ CR capsule Take 10 mEq by mouth daily.  08/15/10   Historical Provider, MD  traMADol (ULTRAM) 50 MG tablet Take 1 tablet (50 mg total) by mouth every 6 (six) hours as needed. 07/05/16   Charlesetta Shanks, MD    Family History Family History  Problem Relation Age of Onset  . Heart disease Father     heart problems  . Kidney failure Mother   . Diabetes Mother   . Cancer Brother   . Diabetes Brother     Social History Social History  Substance Use Topics  . Smoking status: Never Smoker  . Smokeless tobacco: Never Used  . Alcohol use No  lives at home Lives alone Uses a cane and walker   Allergies   Patient has no known allergies.   Review of Systems Review of Systems  All other systems reviewed and are negative.    Physical Exam Updated Vital Signs BP (!) 190/62 (BP Location: Right Arm)   Pulse 68   Temp 97.9 F (36.6 C) (Oral)   Resp 20   SpO2 95%   Vital signs normal except hypertension   Physical Exam  Constitutional: She is oriented to person, place, and time. She appears well-developed and well-nourished. She appears distressed.  HENT:  Head: Normocephalic and atraumatic.  Right Ear: External ear normal.  Left Ear: External ear normal.  Nose: Nose normal.  Eyes: Conjunctivae and EOM are normal.  Neck: Normal range of motion.   Cardiovascular: Normal rate.   Pulmonary/Chest: Effort normal. No respiratory distress.  Musculoskeletal: She exhibits tenderness. She exhibits no edema or deformity.  Neurological: She is alert and oriented to person, place, and time. No cranial nerve deficit.  Skin: Skin is warm and dry. There is erythema.  Patient has 2 reddened areas on her right thigh. The larger one which is more anterior and lateral is 6 x 17 cm in size. It is well within the ink margins drawn by her PCP. There is no induration or thickening of the skin felt. The skin is only minimally  warm. There is another area that is more medial and posterior and it is 5 cm in size. It is very faint and also within the ink margins placed by her PCP. Daughter states they used to be a knot under the skin in this area however it is not there now and daughter agrees that it is gone.There is no evidence of leaking or drainage.  Nursing note and vitals reviewed.  Right anterior thigh     Right inner thigh posteriorly     ED Treatments / Results  Labs (all labs ordered are listed, but only abnormal results are displayed) Results for orders placed or performed during the hospital encounter of 07/12/16  Comprehensive metabolic panel  Result Value Ref Range   Sodium 135 135 - 145 mmol/L   Potassium 4.4 3.5 - 5.1 mmol/L   Chloride 102 101 - 111 mmol/L   CO2 28 22 - 32 mmol/L   Glucose, Bld 127 (H) 65 - 99 mg/dL   BUN 20 6 - 20 mg/dL   Creatinine, Ser 1.11 (H) 0.44 - 1.00 mg/dL   Calcium 8.8 (L) 8.9 - 10.3 mg/dL   Total Protein 6.2 (L) 6.5 - 8.1 g/dL   Albumin 3.1 (L) 3.5 - 5.0 g/dL   AST 17 15 - 41 U/L   ALT 16 14 - 54 U/L   Alkaline Phosphatase 74 38 - 126 U/L   Total Bilirubin 0.3 0.3 - 1.2 mg/dL   GFR calc non Af Amer 44 (L) >60 mL/min   GFR calc Af Amer 51 (L) >60 mL/min   Anion gap 5 5 - 15  CBC with Differential  Result Value Ref Range   WBC 7.7 4.0 - 10.5 K/uL   RBC 3.31 (L) 3.87 - 5.11 MIL/uL   Hemoglobin 10.2  (L) 12.0 - 15.0 g/dL   HCT 30.6 (L) 36.0 - 46.0 %   MCV 92.4 78.0 - 100.0 fL   MCH 30.8 26.0 - 34.0 pg   MCHC 33.3 30.0 - 36.0 g/dL   RDW 13.4 11.5 - 15.5 %   Platelets 198 150 - 400 K/uL   Neutrophils Relative % 67 %   Neutro Abs 5.2 1.7 - 7.7 K/uL   Lymphocytes Relative 19 %   Lymphs Abs 1.4 0.7 - 4.0 K/uL   Monocytes Relative 11 %   Monocytes Absolute 0.8 0.1 - 1.0 K/uL   Eosinophils Relative 3 %   Eosinophils Absolute 0.3 0.0 - 0.7 K/uL   Basophils Relative 0 %   Basophils Absolute 0.0 0.0 - 0.1 K/uL    Laboratory interpretation all normal except mild anemia, renal insufficiency    Radiology Ct Extrem Lower W Cm Bil  Result Date: 07/12/2016 CLINICAL DATA:  Right leg pain and erythema. EXAM: CT OF THE LOWER BILATERAL EXTREMITY WITH CONTRAST TECHNIQUE: Multidetector CT imaging of the lower bilateral extremity was performed according to the standard protocol following intravenous contrast administration. COMPARISON:  None. CONTRAST:  158mL ISOVUE-300 IOPAMIDOL (ISOVUE-300) INJECTION 61% FINDINGS: No fracture or other acute bone abnormality. No bone lesion or bony destruction. There is mild skin thickening and slight subcutaneous stranding opacity in the medial right by, series 3 image 126-162. This is consistent with cellulitis. No abscess or drainable collection. No soft tissue gas. No significant muscular abnormality. IMPRESSION: Skin thickening and mild subcutaneous stranding consistent with cellulitis. No abscess or drainable collection. No bone or muscular abnormality. Electronically Signed   By: Andreas Newport M.D.   On: 07/12/2016 05:47  Ct Angio Chest/abd/pel For Dissection W And/or W/wo  Result Date: 07/05/2016 CLINICAL DATA:  Central chest pain radiating to back COMPARISON:  CT chest 02/17/2013  IMPRESSION: Aortic atherosclerosis with a areas of narrowing identified at the origins of the celiac, SMA, IMA, and renal arteries. No evidence of aortic aneurysm or  dissection. Sigmoid diverticulosis without evidence of diverticulitis. Superior endplate T4 compression fracture with mild anterior height loss, indeterminate in appearance but new since 2014. 6 mm RIGHT lower lobe nodule, recommendation below. Non-contrast chest CT at 6-12 months is recommended. If the nodule is stable at time of repeat CT, then future CT at 18-24 months (from today's scan) is considered optional for low-risk patients, but is recommended for high-risk patients. This recommendation follows the consensus statement: Guidelines for Management of Incidental Pulmonary Nodules Detected on CT Images: From the Fleischner Society 2017; Radiology 2017; 284:228-243. Electronically Signed   By: Lavonia Dana M.D.   On: 07/05/2016 15:07   Procedures Procedures (including critical care time)  Medications Ordered in ED Medications  labetalol (NORMODYNE,TRANDATE) injection 20 mg (not administered)  clindamycin (CLEOCIN) IVPB 900 mg (not administered)  sodium chloride 0.9 % bolus 1,000 mL (1,000 mLs Intravenous New Bag/Given 07/12/16 0416)  fentaNYL (SUBLIMAZE) injection 50 mcg (50 mcg Intravenous Given 07/12/16 0416)  iopamidol (ISOVUE-300) 61 % injection 100 mL (100 mLs Intravenous Contrast Given 07/12/16 0459)  fentaNYL (SUBLIMAZE) injection 50 mcg (50 mcg Intravenous Given 07/12/16 0545)     Initial Impression / Assessment and Plan / ED Course  I have reviewed the triage vital signs and the nursing notes.  Pertinent labs & imaging results that were available during my care of the patient were reviewed by me and considered in my medical decision making (see chart for details).  Patient's pain seems out of proportion to her physical exam. The cellulitis does not seem worse than when her doctor saw it, it's well within the ink margins that were placed 2 days ago. Because of this CT scan was done to make sure there was nothing underlying such as an abscess or fasciitis present.  After reviewing her CT  scan she was given clindamycin 900 mg IV. She had been given fentanyl twice for pain. Her blood pressure when she arrived in the ED was 190/62 and then went up to 228/87 after her CT scan. She was given labetalol IV. Review of her prior low pressure shows her blood pressure ranges anywhere from 151/61 up to 180/59 on March 30th.  Patient seems to have failed outpatient treatment of her cellulitis. Her daughter states she will go home and get the antibiotic bottle that she was on. We discussed admission and they are agreeable.        06:27 AM Dr Darrick Meigs, hospitalist, will admit.   Daughter brought her antibiotic bottle and she had been prescribed doxycycline.   Review of the [place] shows patient has only had 2 prescriptions filled in the past 6 months, #60 tramadol 50 mg tablets prescribed on December 4 from her PCP and #20 tramadol 50 mg tablets prescribed on March 30 from the ED.  Final Clinical Impressions(s) / ED Diagnoses   Final diagnoses:  Pain of right thigh  Cellulitis of right thigh   Plan admission  Rolland Porter, MD, Barbette Or, MD 07/12/16 [phone]

## 2016-07-12 NOTE — Care Management Obs Status (Signed)
[facility] NOTIFICATION   Patient Details  Name: KHAMIYAH GREFE MRN: [id number] Date of Birth: 1928/06/25   Medicare Observation Status Notification Given:  Yes    Sherald Barge, RN 07/12/2016, 9:42 AM

## 2016-07-13 LAB — HEMOGLOBIN A1C
Hgb A1c MFr Bld: 6 % — ABNORMAL HIGH (ref 4.8–5.6)
MEAN PLASMA GLUCOSE: 126 mg/dL

## 2016-07-14 ENCOUNTER — Emergency Department (HOSPITAL_COMMUNITY): Payer: Medicare Other

## 2016-07-14 ENCOUNTER — Encounter (HOSPITAL_COMMUNITY): Payer: Self-pay

## 2016-07-14 ENCOUNTER — Observation Stay (HOSPITAL_COMMUNITY): Payer: Medicare Other

## 2016-07-14 ENCOUNTER — Inpatient Hospital Stay (HOSPITAL_COMMUNITY)
Admission: EM | Admit: 2016-07-14 | Discharge: 2016-07-20 | DRG: 543 | Disposition: A | Discharge status: Home Health | Payer: Medicare Other | Attending: Family Medicine | Admitting: Family Medicine

## 2016-07-14 DIAGNOSIS — I160 Hypertensive urgency: Secondary | ICD-10-CM | POA: Diagnosis present

## 2016-07-14 DIAGNOSIS — E119 Type 2 diabetes mellitus without complications: Secondary | ICD-10-CM

## 2016-07-14 DIAGNOSIS — I5032 Chronic diastolic (congestive) heart failure: Secondary | ICD-10-CM | POA: Diagnosis not present

## 2016-07-14 DIAGNOSIS — Z8542 Personal history of malignant neoplasm of other parts of uterus: Secondary | ICD-10-CM

## 2016-07-14 DIAGNOSIS — M549 Dorsalgia, unspecified: Secondary | ICD-10-CM

## 2016-07-14 DIAGNOSIS — R079 Chest pain, unspecified: Secondary | ICD-10-CM | POA: Diagnosis not present

## 2016-07-14 DIAGNOSIS — I5030 Unspecified diastolic (congestive) heart failure: Secondary | ICD-10-CM | POA: Diagnosis present

## 2016-07-14 DIAGNOSIS — N182 Chronic kidney disease, stage 2 (mild): Secondary | ICD-10-CM | POA: Diagnosis not present

## 2016-07-14 DIAGNOSIS — E1122 Type 2 diabetes mellitus with diabetic chronic kidney disease: Secondary | ICD-10-CM | POA: Diagnosis present

## 2016-07-14 DIAGNOSIS — R0602 Shortness of breath: Secondary | ICD-10-CM

## 2016-07-14 DIAGNOSIS — K5730 Diverticulosis of large intestine without perforation or abscess without bleeding: Secondary | ICD-10-CM | POA: Diagnosis not present

## 2016-07-14 DIAGNOSIS — I130 Hypertensive heart and chronic kidney disease with heart failure and stage 1 through stage 4 chronic kidney disease, or unspecified chronic kidney disease: Secondary | ICD-10-CM | POA: Diagnosis present

## 2016-07-14 DIAGNOSIS — L03115 Cellulitis of right lower limb: Secondary | ICD-10-CM | POA: Diagnosis present

## 2016-07-14 DIAGNOSIS — M4854XA Collapsed vertebra, not elsewhere classified, thoracic region, initial encounter for fracture: Principal | ICD-10-CM | POA: Diagnosis present

## 2016-07-14 DIAGNOSIS — M545 Low back pain, unspecified: Secondary | ICD-10-CM

## 2016-07-14 DIAGNOSIS — Z79899 Other long term (current) drug therapy: Secondary | ICD-10-CM

## 2016-07-14 DIAGNOSIS — M25551 Pain in right hip: Secondary | ICD-10-CM | POA: Diagnosis present

## 2016-07-14 DIAGNOSIS — E039 Hypothyroidism, unspecified: Secondary | ICD-10-CM | POA: Diagnosis present

## 2016-07-14 DIAGNOSIS — Z8249 Family history of ischemic heart disease and other diseases of the circulatory system: Secondary | ICD-10-CM

## 2016-07-14 DIAGNOSIS — N179 Acute kidney failure, unspecified: Secondary | ICD-10-CM

## 2016-07-14 DIAGNOSIS — Z90710 Acquired absence of both cervix and uterus: Secondary | ICD-10-CM

## 2016-07-14 DIAGNOSIS — K5900 Constipation, unspecified: Secondary | ICD-10-CM | POA: Diagnosis present

## 2016-07-14 DIAGNOSIS — K219 Gastro-esophageal reflux disease without esophagitis: Secondary | ICD-10-CM | POA: Diagnosis present

## 2016-07-14 DIAGNOSIS — R1310 Dysphagia, unspecified: Secondary | ICD-10-CM

## 2016-07-14 DIAGNOSIS — E785 Hyperlipidemia, unspecified: Secondary | ICD-10-CM | POA: Diagnosis present

## 2016-07-14 DIAGNOSIS — Z7951 Long term (current) use of inhaled steroids: Secondary | ICD-10-CM

## 2016-07-14 DIAGNOSIS — Z7984 Long term (current) use of oral hypoglycemic drugs: Secondary | ICD-10-CM

## 2016-07-14 DIAGNOSIS — K449 Diaphragmatic hernia without obstruction or gangrene: Secondary | ICD-10-CM | POA: Diagnosis present

## 2016-07-14 DIAGNOSIS — R911 Solitary pulmonary nodule: Secondary | ICD-10-CM | POA: Diagnosis present

## 2016-07-14 DIAGNOSIS — R52 Pain, unspecified: Secondary | ICD-10-CM

## 2016-07-14 DIAGNOSIS — Z833 Family history of diabetes mellitus: Secondary | ICD-10-CM

## 2016-07-14 LAB — CBC WITH DIFFERENTIAL/PLATELET
BASOS ABS: 0 10*3/uL (ref 0.0–0.1)
Basophils Relative: 0 %
EOS PCT: 2 %
Eosinophils Absolute: 0.1 10*3/uL (ref 0.0–0.7)
HCT: 32.7 % — ABNORMAL LOW (ref 36.0–46.0)
Hemoglobin: 10.8 g/dL — ABNORMAL LOW (ref 12.0–15.0)
LYMPHS ABS: 1.6 10*3/uL (ref 0.7–4.0)
LYMPHS PCT: 23 %
MCH: 30.1 pg (ref 26.0–34.0)
MCHC: 33 g/dL (ref 30.0–36.0)
MCV: 91.1 fL (ref 78.0–100.0)
MONO ABS: 0.7 10*3/uL (ref 0.1–1.0)
MONOS PCT: 11 %
Neutro Abs: 4.4 10*3/uL (ref 1.7–7.7)
Neutrophils Relative %: 64 %
PLATELETS: 225 10*3/uL (ref 150–400)
RBC: 3.59 MIL/uL — ABNORMAL LOW (ref 3.87–5.11)
RDW: 13.7 % (ref 11.5–15.5)
WBC: 6.9 10*3/uL (ref 4.0–10.5)

## 2016-07-14 LAB — CREATININE, SERUM
CREATININE: 1.07 mg/dL — AB (ref 0.44–1.00)
GFR, EST AFRICAN AMERICAN: 53 mL/min — AB (ref >60)
GFR, EST NON AFRICAN AMERICAN: 46 mL/min — AB (ref >60)

## 2016-07-14 LAB — BASIC METABOLIC PANEL
Anion gap: 9 (ref 5–15)
BUN: 12 mg/dL (ref 6–20)
CALCIUM: 9 mg/dL (ref 8.9–10.3)
CO2: 29 mmol/L (ref 22–32)
CREATININE: 1.06 mg/dL — AB (ref 0.44–1.00)
Chloride: 96 mmol/L — ABNORMAL LOW (ref 101–111)
GFR calc Af Amer: 54 mL/min — ABNORMAL LOW (ref >60)
GFR, EST NON AFRICAN AMERICAN: 46 mL/min — AB (ref >60)
GLUCOSE: 153 mg/dL — AB (ref 65–99)
Potassium: 4.2 mmol/L (ref 3.5–5.1)
Sodium: 134 mmol/L — ABNORMAL LOW (ref 135–145)

## 2016-07-14 LAB — BRAIN NATRIURETIC PEPTIDE: B Natriuretic Peptide: 94.4 pg/mL (ref 0.0–100.0)

## 2016-07-14 LAB — HEPATIC FUNCTION PANEL
ALT: 15 U/L (ref 14–54)
AST: 21 U/L (ref 15–41)
Albumin: 2.8 g/dL — ABNORMAL LOW (ref 3.5–5.0)
Alkaline Phosphatase: 68 U/L (ref 38–126)
Bilirubin, Direct: 0.1 mg/dL (ref 0.1–0.5)
Indirect Bilirubin: 0.3 mg/dL (ref 0.3–0.9)
Total Bilirubin: 0.4 mg/dL (ref 0.3–1.2)
Total Protein: 6.7 g/dL (ref 6.5–8.1)

## 2016-07-14 LAB — DIFFERENTIAL
BASOS PCT: 0 %
Basophils Absolute: 0 10*3/uL (ref 0.0–0.1)
EOS ABS: 0.1 10*3/uL (ref 0.0–0.7)
EOS PCT: 2 %
Lymphocytes Relative: 29 %
Lymphs Abs: 1.9 10*3/uL (ref 0.7–4.0)
MONO ABS: 0.7 10*3/uL (ref 0.1–1.0)
Monocytes Relative: 11 %
NEUTROS PCT: 58 %
Neutro Abs: 3.7 10*3/uL (ref 1.7–7.7)

## 2016-07-14 LAB — TROPONIN I: Troponin I: <0.03 ng/mL (ref <0.03)

## 2016-07-14 LAB — GLUCOSE, CAPILLARY: GLUCOSE-CAPILLARY: 184 mg/dL — AB (ref 65–99)

## 2016-07-14 LAB — CBC
HCT: 32.5 % — ABNORMAL LOW (ref 36.0–46.0)
Hemoglobin: 10.7 g/dL — ABNORMAL LOW (ref 12.0–15.0)
MCH: 29.9 pg (ref 26.0–34.0)
MCHC: 32.9 g/dL (ref 30.0–36.0)
MCV: 90.8 fL (ref 78.0–100.0)
PLATELETS: 211 10*3/uL (ref 150–400)
RBC: 3.58 MIL/uL — AB (ref 3.87–5.11)
RDW: 13.8 % (ref 11.5–15.5)
WBC: 6.8 10*3/uL (ref 4.0–10.5)

## 2016-07-14 LAB — I-STAT TROPONIN, ED
Troponin i, poc: 0 ng/mL (ref 0.00–0.08)
Troponin i, poc: 0 ng/mL (ref 0.00–0.08)

## 2016-07-14 MED ORDER — LEVOTHYROXINE SODIUM 50 MCG PO TABS
50.0000 ug | ORAL_TABLET | Freq: Every day | ORAL | Status: DC
  Administered 2016-07-15 – 2016-07-20 (×6): 50 ug via ORAL
  Filled 2016-07-14 (×6): qty 1

## 2016-07-14 MED ORDER — DOCUSATE SODIUM 100 MG PO CAPS
200.0000 mg | ORAL_CAPSULE | Freq: Every day | ORAL | Status: DC
  Administered 2016-07-14 – 2016-07-19 (×6): 200 mg via ORAL
  Filled 2016-07-14 (×6): qty 2

## 2016-07-14 MED ORDER — ATORVASTATIN CALCIUM 10 MG PO TABS
10.0000 mg | ORAL_TABLET | Freq: Every day | ORAL | Status: DC
  Administered 2016-07-15 – 2016-07-17 (×3): 10 mg via ORAL
  Filled 2016-07-14 (×4): qty 1

## 2016-07-14 MED ORDER — OCUVITE-LUTEIN PO CAPS
1.0000 | ORAL_CAPSULE | Freq: Two times a day (BID) | ORAL | Status: DC
  Administered 2016-07-14: 1 via ORAL
  Filled 2016-07-14 (×2): qty 1

## 2016-07-14 MED ORDER — DEXTROSE 5 % IV SOLN
100.0000 mg | Freq: Two times a day (BID) | INTRAVENOUS | Status: DC
  Administered 2016-07-14 – 2016-07-17 (×6): 100 mg via INTRAVENOUS
  Filled 2016-07-14 (×6): qty 100

## 2016-07-14 MED ORDER — LOSARTAN POTASSIUM 50 MG PO TABS
50.0000 mg | ORAL_TABLET | Freq: Once | ORAL | Status: AC
  Administered 2016-07-14: 50 mg via ORAL
  Filled 2016-07-14: qty 1

## 2016-07-14 MED ORDER — ACETAMINOPHEN 500 MG PO TABS: 1000.0000 mg | ORAL_TABLET | Freq: Four times a day (QID) | ORAL | Status: DC | PRN

## 2016-07-14 MED ORDER — INSULIN ASPART 100 UNIT/ML ~~LOC~~ SOLN
0.0000 [IU] | Freq: Three times a day (TID) | SUBCUTANEOUS | Status: DC
  Administered 2016-07-15 – 2016-07-16 (×3): 2 [IU] via SUBCUTANEOUS
  Administered 2016-07-17 (×3): 1 [IU] via SUBCUTANEOUS
  Administered 2016-07-18: 2 [IU] via SUBCUTANEOUS
  Administered 2016-07-18: 1 [IU] via SUBCUTANEOUS
  Administered 2016-07-19: 2 [IU] via SUBCUTANEOUS

## 2016-07-14 MED ORDER — AMLODIPINE BESYLATE 5 MG PO TABS
5.0000 mg | ORAL_TABLET | Freq: Once | ORAL | Status: AC
  Administered 2016-07-14: 5 mg via ORAL
  Filled 2016-07-14: qty 1

## 2016-07-14 MED ORDER — CALCIUM POLYCARBOPHIL 625 MG PO TABS
1250.0000 mg | ORAL_TABLET | Freq: Every day | ORAL | Status: DC
Start: 2016-07-15 — End: 2016-07-20
  Administered 2016-07-15 – 2016-07-20 (×5): 1250 mg via ORAL
  Filled 2016-07-14 (×6): qty 2

## 2016-07-14 MED ORDER — ALBUTEROL SULFATE (2.5 MG/3ML) 0.083% IN NEBU: 2.5000 mg | INHALATION_SOLUTION | RESPIRATORY_TRACT | Status: DC | PRN

## 2016-07-14 MED ORDER — MOMETASONE FURO-FORMOTEROL FUM 200-5 MCG/ACT IN AERO
2.0000 | INHALATION_SPRAY | Freq: Two times a day (BID) | RESPIRATORY_TRACT | Status: DC
  Administered 2016-07-14 – 2016-07-20 (×11): 2 via RESPIRATORY_TRACT
  Filled 2016-07-14: qty 8.8

## 2016-07-14 MED ORDER — ONDANSETRON HCL 4 MG/2ML IJ SOLN: 4.0000 mg | Freq: Four times a day (QID) | INTRAMUSCULAR | Status: DC | PRN

## 2016-07-14 MED ORDER — HYDRALAZINE HCL 20 MG/ML IJ SOLN
10.0000 mg | Freq: Four times a day (QID) | INTRAMUSCULAR | Status: DC | PRN
  Administered 2016-07-14: 10 mg via INTRAVENOUS
  Filled 2016-07-14: qty 1

## 2016-07-14 MED ORDER — ACETAMINOPHEN 325 MG PO TABS
650.0000 mg | ORAL_TABLET | ORAL | Status: DC | PRN
Start: 2016-07-14 — End: 2016-07-20
  Administered 2016-07-14 – 2016-07-16 (×3): 650 mg via ORAL
  Filled 2016-07-14 (×3): qty 2

## 2016-07-14 MED ORDER — LOSARTAN POTASSIUM 50 MG PO TABS
50.0000 mg | ORAL_TABLET | Freq: Every day | ORAL | Status: DC
  Administered 2016-07-15: 50 mg via ORAL
  Filled 2016-07-14: qty 1

## 2016-07-14 MED ORDER — IOPAMIDOL (ISOVUE-370) INJECTION 76%
INTRAVENOUS | Status: AC
  Administered 2016-07-14: 100 mL
  Filled 2016-07-14: qty 100

## 2016-07-14 MED ORDER — MONTELUKAST SODIUM 10 MG PO TABS
10.0000 mg | ORAL_TABLET | Freq: Every day | ORAL | Status: DC
  Administered 2016-07-15 – 2016-07-20 (×6): 10 mg via ORAL
  Filled 2016-07-14 (×6): qty 1

## 2016-07-14 MED ORDER — GI COCKTAIL ~~LOC~~
30.0000 mL | Freq: Four times a day (QID) | ORAL | Status: DC | PRN
  Administered 2016-07-19: 30 mL via ORAL
  Filled 2016-07-14: qty 30

## 2016-07-14 MED ORDER — HEPARIN SODIUM (PORCINE) 5000 UNIT/ML IJ SOLN
5000.0000 [IU] | Freq: Three times a day (TID) | INTRAMUSCULAR | Status: DC
  Administered 2016-07-14 – 2016-07-20 (×17): 5000 [IU] via SUBCUTANEOUS
  Filled 2016-07-14 (×16): qty 1

## 2016-07-14 MED ORDER — OXYCODONE-ACETAMINOPHEN 5-325 MG PO TABS
1.0000 | ORAL_TABLET | ORAL | Status: DC | PRN
  Administered 2016-07-14 – 2016-07-20 (×18): 1 via ORAL
  Filled 2016-07-14 (×18): qty 1

## 2016-07-14 MED ORDER — POTASSIUM CHLORIDE CRYS ER 10 MEQ PO TBCR
10.0000 meq | EXTENDED_RELEASE_TABLET | Freq: Every day | ORAL | Status: DC
  Administered 2016-07-15 – 2016-07-20 (×6): 10 meq via ORAL
  Filled 2016-07-14 (×6): qty 1

## 2016-07-14 MED ORDER — PANTOPRAZOLE SODIUM 40 MG PO TBEC
40.0000 mg | DELAYED_RELEASE_TABLET | Freq: Every day | ORAL | Status: DC
  Administered 2016-07-15: 40 mg via ORAL
  Filled 2016-07-14: qty 1

## 2016-07-14 MED ORDER — HYPROMELLOSE (GONIOSCOPIC) 2.5 % OP SOLN
1.0000 [drp] | Freq: Every day | OPHTHALMIC | Status: DC | PRN
  Filled 2016-07-14: qty 15

## 2016-07-14 MED ORDER — ALBUTEROL SULFATE (2.5 MG/3ML) 0.083% IN NEBU
2.5000 mg | INHALATION_SOLUTION | Freq: Two times a day (BID) | RESPIRATORY_TRACT | Status: DC
  Administered 2016-07-14 – 2016-07-16 (×4): 2.5 mg via RESPIRATORY_TRACT
  Filled 2016-07-14 (×5): qty 3

## 2016-07-14 NOTE — ED Triage Notes (Signed)
Patient complains of ongoing throat tightness and hoarseness. Has shortness of breath and CP with talking and movement. Patient alert and oriented and just seen at [facility] ED on 4/6 for same

## 2016-07-14 NOTE — ED Notes (Signed)
Pt was able to ambulate to bathroom with minimal assistance.

## 2016-07-14 NOTE — H&P (Signed)
History and Physical    Sandra Cuevas FGH:[id number] DOB: [birth year] DOA: 07/14/2016  PCP: Purvis Kilts, MD   Patient coming from: home  Chief Complaint: chest pain  HPI: Sandra Cuevas is a 83 y.o. female with medical history significant of DM type II, hypertension, hypothyroidism, dyslipidemia,  GERD, diastolic CHF grade II was brought in by family members due to worsening of chest pain that has been going on for early a week Patient was seen in the [facility] ED on the floor 6 2018, diagnosed with cellulitis and was prepared for he admission but after the family realized that she would be admitted under observation status, they refused and patient left AMA.   She came back to the hospital with complaints of mid chest pain that feels like her blood pressure and radiates to both of her arms with sensation of heaviness in her arms bilaterally. Patient also describes severe pain in the interscapular region that was present during the last few days, but currently she has only one type of pain localized in the middle of the chest and described it as a severe pressure  She denied any subjective fevers or chills, no cough or shortness of breath, no abdominal pain or diarrhea. Patient c/o hoarse voice, right thigh pain and SOB worse with exertion  ED Course: On presentation patient had bradycardia, blood pressure was elevated to 232/81 mmHg and during my assessment it was 213/78 mmHg Chest x-ray showed bilateral small pleural effusions otherwise no acute changes, EKG and showed new ST/T-wave changes with ST depression in lead II, mild ST segment elevation with T wave inversion in lead III, right bundle branch block with Q waves in V1-V2, which are new in comparison to the previous EKG Blood work showed creatinine 1.06, which is a baseline  Review of Systems: As per HPI otherwise 10 point review of systems negative.   Ambulatory Status: Uses walker and cane intermittently  Past Medical History:    Diagnosis Date  . Atypical chest pain   . 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 Diabetes mellitus   . GERD (gastroesophageal reflux disease)   . History of cardiovascular stress test 06/01/2010   EF 71% - no evidence of ischemia, normal left ventricular systolic function  . History of hysterectomy 1965  . Hyperlipidemia   . Hypertension   . Hypothyroidism   . Shortness of breath    on exertion    Past Surgical History:  Procedure Laterality Date  . ABDOMINAL HYSTERECTOMY    . CARDIAC SURGERY     catherization  . HEMORRHOID SURGERY      Social History   Social History  . Marital status: Widowed    Spouse name: N/A  . Number of children: N/A  . Years of education: N/A   Occupational History  . Not on file.   Social History Main Topics  . Smoking status: Never Smoker  . Smokeless tobacco: Never Used  . Alcohol use No  . Drug use: No  . Sexual activity: No   Other Topics Concern  . Not on file   Social History Narrative  . No narrative on file    No Known Allergies  Family History  Problem Relation Age of Onset  . Heart disease Father     heart problems  . Kidney failure Mother   . Diabetes Mother   . Cancer Brother   . Diabetes Brother  Prior to Admission medications   Medication Sig Start Date End Date Taking? Authorizing Provider  acetaminophen (TYLENOL) 500 MG tablet Take 2 tablets (1,000 mg total) by mouth every 6 (six) hours as needed. 07/05/16   Charlesetta Shanks, MD  albuterol (PROVENTIL HFA;VENTOLIN HFA) 108 (90 BASE) MCG/ACT inhaler Inhale 2 puffs into the lungs 2 (two) times daily. 02/25/13 09/04/16  Shanker Kristeen Mans, MD  albuterol (PROVENTIL) (5 MG/ML) 0.5% nebulizer solution Take 0.5 mLs (2.5 mg total) by nebulization every 4 (four) hours as needed for wheezing or shortness of breath. 02/25/13   Shanker Kristeen Mans, MD  amLODipine (NORVASC) 5 MG tablet Take 1 tablet (5 mg total) by mouth daily. 07/05/16   Charlesetta Shanks, MD   atorvastatin (LIPITOR) 10 MG tablet Take 10 mg by mouth Daily.  07/13/10   Historical Provider, MD  budesonide-formoterol (SYMBICORT) 160-4.5 MCG/ACT inhaler Inhale 2 puffs into the lungs 2 (two) times daily. Patient not taking: Reported on 07/05/2016 05/25/15   Juanito Doom, MD  dexlansoprazole (DEXILANT) 60 MG capsule Take 60 mg by mouth 2 (two) times daily.     Historical Provider, MD  docusate sodium (COLACE) 100 MG capsule Take 200 mg by mouth at bedtime.    Historical Provider, MD  famotidine (PEPCID) 20 MG tablet Take 1 tablet (20 mg total) by mouth 2 (two) times daily. 07/05/16   Charlesetta Shanks, MD  furosemide (LASIX) 40 MG tablet Take 40 mg by mouth as needed for fluid or edema.     Historical Provider, MD  levothyroxine (SYNTHROID, LEVOTHROID) 50 MCG tablet Take 50 mcg by mouth daily.      Historical Provider, MD  losartan (COZAAR) 50 MG tablet Once daily 01/26/14   Historical Provider, MD  metFORMIN (GLUCOPHAGE) 500 MG tablet Take 1 tablet (500 mg total) by mouth 2 (two) times daily with a meal. Patient taking differently: Take 500 mg by mouth daily as needed (high blood sugar).  02/25/13   Shanker Kristeen Mans, MD  Multiple Vitamins-Minerals (OCUVITE EYE + MULTI) TABS Take by mouth 2 (two) times daily.    Historical Provider, MD  polycarbophil (FIBERCON) 625 MG tablet Take 1,250 mg by mouth daily.     Historical Provider, MD  Polyvinyl Alcohol-Povidone (MURINE TEARS FOR DRY EYES) 5-6 MG/ML SOLN Place 1 drop into both eyes daily as needed (Dry Eyes).    Historical Provider, MD  potassium chloride (MICRO-K) 10 MEQ CR capsule Take 10 mEq by mouth daily.  08/15/10   Historical Provider, MD  traMADol (ULTRAM) 50 MG tablet Take 1 tablet (50 mg total) by mouth every 6 (six) hours as needed. 07/05/16   Charlesetta Shanks, MD    Physical Exam: Vitals:   07/14/16 1500 07/14/16 1530 07/14/16 1600 07/14/16 1615  BP: (!) 216/78 (!) 216/77 (!) 221/76 (!) 192/62  Pulse: 64 63 62 65  Resp: 15 18 18  (!) 22   Temp:      SpO2: 95% 94% 93% 95%     General: Appears calm and comfortable Eyes: PERRLA, EOMI, normal lids, iris ENT:  grossly normal hearing, lips & tongue, mucous membranes moist and intact Neck: no lymphoadenopathy, masses or thyromegaly Cardiovascular: RRR, no m/r/g. No JVD, carotid bruits. No LE edema.  Respiratory: bilateral no wheezes, rales, rhonchi or cracles. Normal respiratory effort. No accessory muscle use observed Abdomen: soft, non-tender, non-distended, no organomegaly or masses appreciated. BS present in all quadrants Skin: no ulcers or induration seen on limited exam, right anterior thigh erythema with outlined margins  noted, very tender and hyperemic to touch  Musculoskeletal: grossly normal tone BUE/BLE, good ROM, no bony abnormality or joint deformities observed Psychiatric: grossly normal mood and affect, speech fluent and appropriate, alert and oriented x3 Neurologic: CN II-XII grossly intact, moves all extremities in coordinated fashion, sensation intact  Labs on Admission: I have personally reviewed following labs and imaging studies  CBC, BMP  GFR: Estimated Creatinine Clearance: 43.8 mL/min (A) (by C-G formula based on SCr of 1.06 mg/dL (H)).   Creatinine Clearance: Estimated Creatinine Clearance: 43.8 mL/min (A) (by C-G formula based on SCr of 1.06 mg/dL (H)).    Radiological Exams on Admission: Dg Chest 2 View  Result Date: 07/14/2016 CLINICAL DATA:  Shortness of breath.  Cellulitis in legs. EXAM: CHEST  2 VIEW COMPARISON:  July 12, 2016 FINDINGS: Mild cardiomegaly. Mild atelectasis in the bases. No pneumothorax. No pulmonary nodules, masses, or focal infiltrates. Tiny pleural effusions. No overt edema. IMPRESSION: Cardiomegaly.  Tiny pleural effusions with no overt edema. Electronically Signed   By: Dorise Bullion III M.D   On: 07/14/2016 12:33    EKG: Independently reviewed - sinus bradycardia with new ST-T wave changes in the leads 2, 3,  V1-V2   Assessment/Plan Principal Problem:   Chest pain Active Problems:   Hypothyroidism   Chronic diastolic CHF (congestive heart failure) (HCC)   Cellulitis of right leg   Hypertensive urgency   Diabetes mellitus, type II (HCC)   CKD (chronic kidney disease), stage II    Chest pain with EKG changes Patient will admitted on a rule out ACS protocol, monitor on telemetry, cycle troponin Obtain CT angiogram to rule out dissection given her uncontrolled blood pressure and pain that was radiating to the back and localized in the interscapular region  Hypertensive urgency Patient reported that this is new and she was always been told that her blood pressure is well controlled by her PCP She took her home dose of amlodipine and losartan prior to coming to the ED and the doses were repeated in the emergency She received 10 mg hydralazine that improved her pressure to 192/62 mmHg, Continue hydralazine IV when necessary with parameters, increase home dose Norvasc to 10 mg starting tomorrow Monitor blood pressure 90 home meds prior to discharge if needed  Chronic CHF, diastolic Patient is stable, no sighs of FVO noted, continue home therapy and observed  Cellulitis of the right thigh Continue Doxycycline  Hyperliidemia - Continue Lipitor and Fibercon  CKD type II Stable, creatinine at baseline, continue to monitor renal indices  DM type II - most recent HgbA1C is 6.0% on 07/12/2016  Continue diabetic diet, monitor FSBS QACHS, add sliding scale insulin Start SSI  Hypothyroidism - most recent TSH Continue Synthroid and if needed, adjust the dose  GERD Patient denied indigestion, but has voice hoarseness that could be secondary to a silent reflux. Awaits for ENT appointment Meanwhile will increase the dose of PPI   DVT prophylaxis: heparin Code Status: Full Family Communication: at bedside Disposition Plan: telemetry Consults called: none Admission status:  observation   York Grice, PA-C Pager: [phone] [facility]  If 7PM-7AM, please contact night-coverage [email or web address] Password [facility]  07/14/2016, 4:42 PM

## 2016-07-14 NOTE — ED Provider Notes (Signed)
[facility] DEPT Provider Note   CSN: [id number] Arrival date & time: 07/14/16  1123     History   Chief Complaint Chief Complaint  Patient presents with  . shortness of breath/ hoarseness/ CP    HPI Zyia C Medlin is a 83 y.o. female with history of hypertension, hyperlipidemia, GERD, diabetes who presents with over a one-week history of chest pain, shortness of breath, and hoarseness of voice. Patient woke up and overnight with her symptoms and they have not resolved. Patient has been seen 3 times in the past week the emergency department for these symptoms. Patient she was also diagnosed with cellulitis of her upper right thigh, for which she is on doxycycline. She is still having pain to this area, however she states the redness has improved. Patient states her chest pain is intermittent and described as a dull ache. This some radiation of pain to her back, between her shoulder blades. At her last visit, patient was diagnosed with a thoracic compression fracture. Patient notes associated increased belching, but no abdominal pain, nausea, vomiting, or urinary symptoms. Patient's pain is improved with her at home pain medication, however returns as it wears off. She denies fevers.Patient denies any recent long trips, surgeries, cancer. Patient has noticed mild swelling in bilateral lower extremities.  HPI  Past Medical History:  Diagnosis Date  . Atypical chest pain   . 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 Diabetes mellitus   . GERD (gastroesophageal reflux disease)   . History of cardiovascular stress test 06/01/2010   EF 71% - no evidence of ischemia, normal left ventricular systolic function  . History of hysterectomy 1965  . Hyperlipidemia   . Hypertension   . Hypothyroidism   . Shortness of breath    on exertion    Patient Active Problem List   Diagnosis Date Noted  . Hypertensive urgency 07/14/2016  . Cellulitis 07/12/2016  . Cellulitis of right leg  07/12/2016  . Chronic diastolic CHF (congestive heart failure) ([place]) 04/30/2016  . Hoarse voice quality 09/05/2015  . Chronic obstructive airway disease with asthma ([place]) 04/20/2013  . Snoring 04/20/2013  . Premature ventricular contractions 03/23/2013  . Acute diastolic heart failure ([place]) 02/18/2013  . Change in stool 01/06/2012  . Shortness of breath 09/26/2011  . Carpal tunnel syndrome 11/06/2010  . Hyperlipidemia   . Atypical chest pain   . Hypothyroidism   . Diabetes mellitus   . DM 02/12/2010  . EXTERNAL HEMORRHOIDS WITHOUT MENTION COMP 02/12/2010  . RECTAL BLEEDING 02/12/2010  . CERVICAL CANCER 10/18/2009  . Dyslipidemia 10/18/2009  . Benign essential HTN 10/18/2009  . Cough 10/18/2009    Past Surgical History:  Procedure Laterality Date  . ABDOMINAL HYSTERECTOMY    . CARDIAC SURGERY     catherization  . HEMORRHOID SURGERY      OB History    No data available       Home Medications    Prior to Admission medications   Medication Sig Start Date End Date Taking? Authorizing Provider  acetaminophen (TYLENOL) 500 MG tablet Take 2 tablets (1,000 mg total) by mouth every 6 (six) hours as needed. 07/05/16   Charlesetta Shanks, MD  albuterol (PROVENTIL HFA;VENTOLIN HFA) 108 (90 BASE) MCG/ACT inhaler Inhale 2 puffs into the lungs 2 (two) times daily. 02/25/13 09/04/16  Shanker Kristeen Mans, MD  albuterol (PROVENTIL) (5 MG/ML) 0.5% nebulizer solution Take 0.5 mLs (2.5 mg total) by nebulization every 4 (four) hours  as needed for wheezing or shortness of breath. 02/25/13   Shanker Kristeen Mans, MD  amLODipine (NORVASC) 5 MG tablet Take 1 tablet (5 mg total) by mouth daily. 07/05/16   Charlesetta Shanks, MD  atorvastatin (LIPITOR) 10 MG tablet Take 10 mg by mouth Daily.  07/13/10   Historical Provider, MD  budesonide-formoterol (SYMBICORT) 160-4.5 MCG/ACT inhaler Inhale 2 puffs into the lungs 2 (two) times daily. Patient not taking: Reported on 07/05/2016 05/25/15   Juanito Doom, MD    dexlansoprazole (DEXILANT) 60 MG capsule Take 60 mg by mouth 2 (two) times daily.     Historical Provider, MD  docusate sodium (COLACE) 100 MG capsule Take 200 mg by mouth at bedtime.    Historical Provider, MD  famotidine (PEPCID) 20 MG tablet Take 1 tablet (20 mg total) by mouth 2 (two) times daily. 07/05/16   Charlesetta Shanks, MD  furosemide (LASIX) 40 MG tablet Take 40 mg by mouth as needed for fluid or edema.     Historical Provider, MD  levothyroxine (SYNTHROID, LEVOTHROID) 50 MCG tablet Take 50 mcg by mouth daily.      Historical Provider, MD  losartan (COZAAR) 50 MG tablet Once daily 01/26/14   Historical Provider, MD  metFORMIN (GLUCOPHAGE) 500 MG tablet Take 1 tablet (500 mg total) by mouth 2 (two) times daily with a meal. Patient taking differently: Take 500 mg by mouth daily as needed (high blood sugar).  02/25/13   Shanker Kristeen Mans, MD  Multiple Vitamins-Minerals (OCUVITE EYE + MULTI) TABS Take by mouth 2 (two) times daily.    Historical Provider, MD  polycarbophil (FIBERCON) 625 MG tablet Take 1,250 mg by mouth daily.     Historical Provider, MD  Polyvinyl Alcohol-Povidone (MURINE TEARS FOR DRY EYES) 5-6 MG/ML SOLN Place 1 drop into both eyes daily as needed (Dry Eyes).    Historical Provider, MD  potassium chloride (MICRO-K) 10 MEQ CR capsule Take 10 mEq by mouth daily.  08/15/10   Historical Provider, MD  traMADol (ULTRAM) 50 MG tablet Take 1 tablet (50 mg total) by mouth every 6 (six) hours as needed. 07/05/16   Charlesetta Shanks, MD    Family History Family History  Problem Relation Age of Onset  . Heart disease Father     heart problems  . Kidney failure Mother   . Diabetes Mother   . Cancer Brother   . Diabetes Brother     Social History Social History  Substance Use Topics  . Smoking status: Never Smoker  . Smokeless tobacco: Never Used  . Alcohol use No     Allergies   Patient has no known allergies.   Review of Systems Review of Systems  Constitutional:  Negative for chills and fever.  HENT: Negative for facial swelling and sore throat.   Respiratory: Positive for shortness of breath. Negative for cough.   Cardiovascular: Positive for chest pain.  Gastrointestinal: Negative for abdominal pain, nausea and vomiting.  Genitourinary: Negative for dysuria.  Musculoskeletal: Positive for back pain.  Skin: Positive for color change and rash. Negative for wound.  Neurological: Negative for headaches.  Psychiatric/Behavioral: The patient is not nervous/anxious.      Physical Exam Updated Vital Signs BP (!) 216/77   Pulse 63   Temp 98.2 F (36.8 C)   Resp 18   SpO2 94%   Physical Exam  Constitutional: She appears well-developed and well-nourished. No distress.  HENT:  Head: Normocephalic and atraumatic.  Mouth/Throat: Oropharynx is clear and moist. No  oropharyngeal exudate.  Eyes: Conjunctivae are normal. Pupils are equal, round, and reactive to light. Right eye exhibits no discharge. Left eye exhibits no discharge. No scleral icterus.  Neck: Normal range of motion. Neck supple. No thyromegaly present.  Cardiovascular: Normal rate, regular rhythm, normal heart sounds and intact distal pulses.  Exam reveals no gallop and no friction rub.   No murmur heard. Pulmonary/Chest: Effort normal and breath sounds normal. No stridor. No respiratory distress. She has no wheezes. She has no rales. She exhibits no tenderness.  Abdominal: Soft. Bowel sounds are normal. She exhibits no distension. There is no tenderness. There is no rebound and no guarding.  Musculoskeletal: She exhibits edema (very mild, no pitting).  Lymphadenopathy:    She has no cervical adenopathy.  Neurological: She is alert. Coordination normal.  Skin: Skin is warm and dry. No rash noted. She is not diaphoretic. No pallor.  Circled area of mild redness and tenderness to right upper thigh; no extension of redness past previous circled area  Psychiatric: She has a normal mood and  affect.  Nursing note and vitals reviewed.    ED Treatments / Results  Labs (all labs ordered are listed, but only abnormal results are displayed) Labs Reviewed  CBC WITH DIFFERENTIAL/PLATELET - Abnormal; Notable for the following:       Result Value   RBC 3.59 (*)    Hemoglobin 10.8 (*)    HCT 32.7 (*)    All other components within normal limits  BASIC METABOLIC PANEL - Abnormal; Notable for the following:    Sodium 134 (*)    Chloride 96 (*)    Glucose, Bld 153 (*)    Creatinine, Ser 1.06 (*)    GFR calc non Af Amer 46 (*)    GFR calc Af Amer 54 (*)    All other components within normal limits  BRAIN NATRIURETIC PEPTIDE  HEPATIC FUNCTION PANEL  I-STAT TROPOININ, ED  I-STAT TROPOININ, ED    EKG  EKG Interpretation  Date/Time:  Sunday July 14 2016 11:37:40 EDT Ventricular Rate:  58 PR Interval:  150 QRS Duration: 152 QT Interval:  516 QTC Calculation: 506 R Axis:   115 Text Interpretation:  Sinus bradycardia Right bundle branch block ST-t wave abnormality No significant change since last tracing Abnormal ekg Confirmed by Carmin Muskrat  MD [phone]) on 07/14/2016 1:06:13 PM       Radiology Dg Chest 2 View  Result Date: 07/14/2016 CLINICAL DATA:  Shortness of breath.  Cellulitis in legs. EXAM: CHEST  2 VIEW COMPARISON:  July 12, 2016 FINDINGS: Mild cardiomegaly. Mild atelectasis in the bases. No pneumothorax. No pulmonary nodules, masses, or focal infiltrates. Tiny pleural effusions. No overt edema. IMPRESSION: Cardiomegaly.  Tiny pleural effusions with no overt edema. Electronically Signed   By: Dorise Bullion III M.D   On: 07/14/2016 12:33    Procedures Procedures (including critical care time)  Medications Ordered in ED Medications  losartan (COZAAR) tablet 50 mg (50 mg Oral Given 07/14/16 1519)  amLODipine (NORVASC) tablet 5 mg (5 mg Oral Given 07/14/16 1519)     Initial Impression / Assessment and Plan / ED Course  I have reviewed the triage vital signs and  the nursing notes.  Pertinent labs & imaging results that were available during my care of the patient were reviewed by me and considered in my medical decision making (see chart for details).     Patient presenting with ongoing chest pain. This is her fourth visit since  07/04/2016. CBC shows 1110.8. BMP shows sodium 134, chloride 96, glucose 153, creatinine 1.06. Initial troponin negative. Delta troponin, BNP, and hepatic function panel pending. CXR shows cardiomegaly and tiny pleural effusions with no overt edema. EKG shows sinus bradycardia, right bundle branch block, ST-T wave abnormality, but no change since last tracing. Considering patient's hypertension despite several medications today and chest pain and patient's recurring visits for same symptoms, I spoke with York Grice, PA-C with [facility] who will admit the patient for further evaluation and treatment. Patient also evaluated by Dr. Vanita Panda who guided the patient's management and agrees with plan.  Final Clinical Impressions(s) / ED Diagnoses   Final diagnoses:  Chest pain, unspecified type  Shortness of breath    New Prescriptions New Prescriptions   No medications on file     Frederica Kuster, Hershal Coria 07/14/16 [place]    Carmin Muskrat, MD 07/16/16 [phone]

## 2016-07-15 ENCOUNTER — Observation Stay (HOSPITAL_COMMUNITY): Payer: Medicare Other

## 2016-07-15 DIAGNOSIS — E119 Type 2 diabetes mellitus without complications: Secondary | ICD-10-CM

## 2016-07-15 DIAGNOSIS — R079 Chest pain, unspecified: Secondary | ICD-10-CM | POA: Diagnosis not present

## 2016-07-15 DIAGNOSIS — I5032 Chronic diastolic (congestive) heart failure: Secondary | ICD-10-CM | POA: Diagnosis present

## 2016-07-15 DIAGNOSIS — M25551 Pain in right hip: Secondary | ICD-10-CM | POA: Diagnosis not present

## 2016-07-15 DIAGNOSIS — R0602 Shortness of breath: Secondary | ICD-10-CM | POA: Diagnosis not present

## 2016-07-15 DIAGNOSIS — K449 Diaphragmatic hernia without obstruction or gangrene: Secondary | ICD-10-CM | POA: Diagnosis present

## 2016-07-15 DIAGNOSIS — Z833 Family history of diabetes mellitus: Secondary | ICD-10-CM | POA: Diagnosis not present

## 2016-07-15 DIAGNOSIS — E785 Hyperlipidemia, unspecified: Secondary | ICD-10-CM | POA: Diagnosis present

## 2016-07-15 DIAGNOSIS — Z7951 Long term (current) use of inhaled steroids: Secondary | ICD-10-CM | POA: Diagnosis not present

## 2016-07-15 DIAGNOSIS — Z90710 Acquired absence of both cervix and uterus: Secondary | ICD-10-CM | POA: Diagnosis not present

## 2016-07-15 DIAGNOSIS — E1122 Type 2 diabetes mellitus with diabetic chronic kidney disease: Secondary | ICD-10-CM | POA: Diagnosis present

## 2016-07-15 DIAGNOSIS — Z8249 Family history of ischemic heart disease and other diseases of the circulatory system: Secondary | ICD-10-CM | POA: Diagnosis not present

## 2016-07-15 DIAGNOSIS — K5900 Constipation, unspecified: Secondary | ICD-10-CM | POA: Diagnosis present

## 2016-07-15 DIAGNOSIS — I160 Hypertensive urgency: Secondary | ICD-10-CM | POA: Diagnosis not present

## 2016-07-15 DIAGNOSIS — Z7984 Long term (current) use of oral hypoglycemic drugs: Secondary | ICD-10-CM | POA: Diagnosis not present

## 2016-07-15 DIAGNOSIS — Z79899 Other long term (current) drug therapy: Secondary | ICD-10-CM | POA: Diagnosis not present

## 2016-07-15 DIAGNOSIS — E039 Hypothyroidism, unspecified: Secondary | ICD-10-CM | POA: Diagnosis present

## 2016-07-15 DIAGNOSIS — N179 Acute kidney failure, unspecified: Secondary | ICD-10-CM | POA: Diagnosis not present

## 2016-07-15 DIAGNOSIS — L03115 Cellulitis of right lower limb: Secondary | ICD-10-CM

## 2016-07-15 DIAGNOSIS — M47816 Spondylosis without myelopathy or radiculopathy, lumbar region: Secondary | ICD-10-CM | POA: Diagnosis not present

## 2016-07-15 DIAGNOSIS — M47814 Spondylosis without myelopathy or radiculopathy, thoracic region: Secondary | ICD-10-CM | POA: Diagnosis not present

## 2016-07-15 DIAGNOSIS — R911 Solitary pulmonary nodule: Secondary | ICD-10-CM | POA: Diagnosis present

## 2016-07-15 DIAGNOSIS — Z8542 Personal history of malignant neoplasm of other parts of uterus: Secondary | ICD-10-CM | POA: Diagnosis not present

## 2016-07-15 DIAGNOSIS — K219 Gastro-esophageal reflux disease without esophagitis: Secondary | ICD-10-CM | POA: Diagnosis not present

## 2016-07-15 DIAGNOSIS — M48061 Spinal stenosis, lumbar region without neurogenic claudication: Secondary | ICD-10-CM | POA: Diagnosis not present

## 2016-07-15 DIAGNOSIS — N182 Chronic kidney disease, stage 2 (mild): Secondary | ICD-10-CM | POA: Diagnosis not present

## 2016-07-15 DIAGNOSIS — R1310 Dysphagia, unspecified: Secondary | ICD-10-CM | POA: Diagnosis not present

## 2016-07-15 DIAGNOSIS — K224 Dyskinesia of esophagus: Secondary | ICD-10-CM | POA: Diagnosis not present

## 2016-07-15 DIAGNOSIS — I130 Hypertensive heart and chronic kidney disease with heart failure and stage 1 through stage 4 chronic kidney disease, or unspecified chronic kidney disease: Secondary | ICD-10-CM | POA: Diagnosis present

## 2016-07-15 DIAGNOSIS — M4854XA Collapsed vertebra, not elsewhere classified, thoracic region, initial encounter for fracture: Secondary | ICD-10-CM | POA: Diagnosis present

## 2016-07-15 LAB — GLUCOSE, CAPILLARY
GLUCOSE-CAPILLARY: 106 mg/dL — AB (ref 65–99)
GLUCOSE-CAPILLARY: 190 mg/dL — AB (ref 65–99)
Glucose-Capillary: 102 mg/dL — ABNORMAL HIGH (ref 65–99)
Glucose-Capillary: 115 mg/dL — ABNORMAL HIGH (ref 65–99)

## 2016-07-15 MED ORDER — SUCRALFATE 1 GM/10ML PO SUSP
1.0000 g | Freq: Three times a day (TID) | ORAL | Status: DC
  Administered 2016-07-15 – 2016-07-17 (×10): 1 g via ORAL
  Filled 2016-07-15 (×10): qty 10

## 2016-07-15 MED ORDER — PANTOPRAZOLE SODIUM 40 MG PO TBEC
40.0000 mg | DELAYED_RELEASE_TABLET | Freq: Two times a day (BID) | ORAL | Status: DC
Start: 2016-07-15 — End: 2016-07-20
  Administered 2016-07-15 – 2016-07-20 (×10): 40 mg via ORAL
  Filled 2016-07-15 (×10): qty 1

## 2016-07-15 MED ORDER — PROSIGHT PO TABS
1.0000 | ORAL_TABLET | Freq: Two times a day (BID) | ORAL | Status: DC
  Administered 2016-07-15 – 2016-07-20 (×11): 1 via ORAL
  Filled 2016-07-15 (×11): qty 1

## 2016-07-15 NOTE — Progress Notes (Signed)
Pt notified of radiology order for 1pm, so we will postpone her lunch. Pt expressed understanding.

## 2016-07-15 NOTE — Progress Notes (Signed)
Pt gone with transport to xray. Lunch held, ACHS check held until s/p radiology in order to treat most accurate glucose prior to eating.   Pt is comfortable in bed with no complaints.  Family expresses concerns about the temperature of pt's food upon her return, as trays arrived simultaneously to her departure.

## 2016-07-15 NOTE — Progress Notes (Signed)
PROGRESS NOTE    LEONTINE RADMAN  XTG:[id number] DOB: Feb 10, 1929 DOA: 07/14/2016 PCP: Purvis Kilts, MD   Outpatient Specialists:     Brief Narrative:  Patient presents with chest pressure in center of chest, radiates into back last night, but no radiation currently. Feels SOB, like she has to exert herself to talk, although no accessory muscles used during exam. BP severely elevated, improved with IV hydralazine. BP (!) 192/62   Pulse 65   Temp 98.2 F (36.8 C)   Resp (!) 22   SpO2 95%  awake and oriented 3, in no distress, lungs clear to auscultation. Regular rate and normal S1-S2 sounds. Abdomen soft, non-tender. No edema peripherally. Good pulses. Will check CTA of chest. Serial troponins. Continue doxycycline for cellulitis   Assessment & Plan:   Principal Problem:   Chest pain Active Problems:   Hypothyroidism   Chronic diastolic CHF (congestive heart failure) (HCC)   Cellulitis of right leg   Hypertensive urgency   Diabetes mellitus, type II (HCC)   CKD (chronic kidney disease), stage II   Chest pain -suspect GI related -CE negative -CTA done of chest:   Hiatal hernia/GERD -has ENT appointment for hoarse voice -+belching -check DG esophagus as patient feels food gets stuck  -PPI BID-- may need additional agent  HTN urgency -? Missed doses of medication at home -continue home meds  Right thigh cellulitis -much improved per patient -continue doxy IV NOT PO as suspect this may be contributing to the patient's symptoms  subactue t4 compression fracture -stable  solid 6 mm right lower lobe pulmonary nodule.  Non-contrast chest CT at 6-12 months is recommended. If the nodule is stable at time of repeat CT, then future CT at 18-24 months (from today's scan) is considered optional for low-risk patients, but is recommended for high-risk patients.  Chronic CHF, diastolic Patient is stable, no sighs of FVO noted, continue home therapy and observed  DM type  II - most recent HgbA1C is 6.0% on 07/12/2016  Continue diabetic diet, monitor FSBS QACHS, add sliding scale insulin Start SSI  Hypothyroidism - most recent TSH Continue Synthroid and if needed, adjust the dose   DVT prophylaxis:  SQ Heparin  Code Status: Full Code   Family Communication: daughter  Disposition Plan:     Consultants:      Subjective: c/o food getting stuck in chest when swallowing   Objective: Vitals:   07/15/16 0507 07/15/16 0802 07/15/16 0838 07/15/16 1100  BP: (!) 144/45 (!) 163/54  (!) 158/50  Pulse: 69 65  (!) 57  Resp: 18 16  16   Temp: 98.3 F (36.8 C) 98.5 F (36.9 C)  97.5 F (36.4 C)  TempSrc: Oral Oral  Oral  SpO2: 98% 96% 90% 92%  Weight: 92.2 kg (203 lb 3.2 oz)     Height:        Intake/Output Summary (Last 24 hours) at 07/15/16 1328 Last data filed at 07/15/16 1100  Gross per 24 hour  Intake             1220 ml  Output             1800 ml  Net             -580 ml   Filed Weights   07/14/16 1818 07/15/16 0507  Weight: 92.7 kg (204 lb 4.8 oz) 92.2 kg (203 lb 3.2 oz)    Examination:  General exam: Appears calm and comfortable  Respiratory system: Clear  to auscultation but diminished Cardiovascular system: S1 & S2 heard, RRR. No JVD, murmurs, rubs, gallops or clicks. No pedal edema. Gastrointestinal system: Abdomen is nondistended, soft and nontender. No organomegaly or masses felt. Normal bowel sounds heard. Central nervous system: Alert and oriented. No focal neurological deficits.    Data Reviewed: I have personally reviewed following labs and imaging studies  CBC:  Recent Labs Lab 07/12/16 0403 07/14/16 1143 07/14/16 1619 07/14/16 1723  WBC 7.7 6.9  --  6.8  NEUTROABS 5.2 4.4 3.7  --   HGB 10.2* 10.8*  --  10.7*  HCT 30.6* 32.7*  --  32.5*  MCV 92.4 91.1  --  90.8  PLT 198 225  --  983   Basic Metabolic Panel:  Recent Labs Lab 07/12/16 0403 07/14/16 1143 07/14/16 1723  NA 135 134*  --   K 4.4 4.2   --   CL 102 96*  --   CO2 28 29  --   GLUCOSE 127* 153*  --   BUN 20 12  --   CREATININE 1.11* 1.06* 1.07*  CALCIUM 8.8* 9.0  --    GFR: Estimated Creatinine Clearance: 43.2 mL/min (A) (by C-G formula based on SCr of 1.07 mg/dL (H)). Liver Function Tests:  Recent Labs Lab 07/12/16 0403 07/14/16 1132  AST 17 21  ALT 16 15  ALKPHOS 74 68  BILITOT 0.3 0.4  PROT 6.2* 6.7  ALBUMIN 3.1* 2.8*   No results for input(s): LIPASE, AMYLASE in the last 168 hours. No results for input(s): AMMONIA in the last 168 hours. Coagulation Profile: No results for input(s): INR, PROTIME in the last 168 hours. Cardiac Enzymes:  Recent Labs Lab 07/12/16 0803 07/14/16 1723  TROPONINI <0.03 <0.03   BNP (last 3 results) No results for input(s): PROBNP in the last 8760 hours. HbA1C: No results for input(s): HGBA1C in the last 72 hours. CBG:  Recent Labs Lab 07/14/16 2046 07/15/16 0800  GLUCAP 184* 102*   Lipid Profile: No results for input(s): CHOL, HDL, LDLCALC, TRIG, CHOLHDL, LDLDIRECT in the last 72 hours. Thyroid Function Tests: No results for input(s): TSH, T4TOTAL, FREET4, T3FREE, THYROIDAB in the last 72 hours. Anemia Panel: No results for input(s): VITAMINB12, FOLATE, FERRITIN, TIBC, IRON, RETICCTPCT in the last 72 hours. Urine analysis:    Component Value Date/Time   COLORURINE YELLOW 12/28/2009 0622   APPEARANCEUR CLOUDY (A) 12/28/2009 0622   LABSPEC 1.021 12/28/2009 0622   PHURINE 5.0 12/28/2009 0622   GLUCOSEU NEGATIVE 12/28/2009 0622   HGBUR NEGATIVE 12/28/2009 0622   BILIRUBINUR NEGATIVE 12/28/2009 0622   KETONESUR NEGATIVE 12/28/2009 0622   PROTEINUR NEGATIVE 12/28/2009 0622   UROBILINOGEN 0.2 12/28/2009 0622   NITRITE NEGATIVE 12/28/2009 0622   LEUKOCYTESUR MODERATE (A) 12/28/2009 0622     )No results found for this or any previous visit (from the past 240 hour(s)).    Anti-infectives    Start     Dose/Rate Route Frequency Ordered Stop   07/14/16 1800   doxycycline (VIBRAMYCIN) 100 mg in dextrose 5 % 250 mL IVPB     100 mg 125 mL/hr over 120 Minutes Intravenous Every 12 hours 07/14/16 1704         Radiology Studies: Dg Chest 2 View  Result Date: 07/14/2016 CLINICAL DATA:  Shortness of breath.  Cellulitis in legs. EXAM: CHEST  2 VIEW COMPARISON:  July 12, 2016 FINDINGS: Mild cardiomegaly. Mild atelectasis in the bases. No pneumothorax. No pulmonary nodules, masses, or focal infiltrates. Tiny pleural effusions.  No overt edema. IMPRESSION: Cardiomegaly.  Tiny pleural effusions with no overt edema. Electronically Signed   By: Dorise Bullion III M.D   On: 07/14/2016 12:33   Ct Angio Chest/abd/pel For Dissection W And/or W/wo  Result Date: 07/14/2016 CLINICAL DATA:  Inpatient.  Chest pain. EXAM: CT ANGIOGRAPHY CHEST, ABDOMEN AND PELVIS TECHNIQUE: Multidetector CT imaging through the chest, abdomen and pelvis was performed using the standard protocol during bolus administration of intravenous contrast. Multiplanar reconstructed images and MIPs were obtained and reviewed to evaluate the vascular anatomy. CONTRAST:  100 cc Isovue 370 IV. COMPARISON:  Chest radiograph from earlier today. 07/05/2016 CT angiogram of the chest, abdomen and pelvis. FINDINGS: CTA CHEST FINDINGS Cardiovascular: Normal heart size. No significant pericardial fluid/thickening. Atherosclerotic nonaneurysmal thoracic aorta. No acute intramural hematoma, dissection, pseudoaneurysm or penetrating atherosclerotic ulcer in the thoracic aorta. Aortic arch branch vessels are patent. Stable top-normal caliber pulmonary arteries (main pulmonary artery diameter 3.2 cm). No central pulmonary emboli. Mediastinum/Nodes: No discrete thyroid nodules. Unremarkable esophagus. No pathologically enlarged axillary, mediastinal or hilar lymph nodes. Lungs/Pleura: No pneumothorax. No pleural effusion. Solid 6 mm peripheral right lower lobe pulmonary nodule (series 8/ image 98), stable since 07/05/2016,  increased from 3 mm on 02/17/2013. No acute consolidative airspace disease, lung masses or additional significant pulmonary nodules. Mosaic attenuation throughout both lungs, unchanged. Musculoskeletal: No aggressive appearing focal osseous lesions. Mild superior T4 vertebral compression fracture is stable since 07/05/2016, new since 02/17/2013. Moderate thoracic spondylosis. Review of the MIP images confirms the above findings. CTA ABDOMEN AND PELVIS FINDINGS VASCULAR Aorta: Atherosclerotic nonaneurysmal abdominal aorta with no dissection, vasculitis or significant stenosis. Celiac: High-grade proximal celiac artery stenosis, stable since 07/05/2016. No aneurysm, dissection or vasculitis. SMA: Moderate to high-grade SMA ostial stenosis, stable since 07/05/2016. No aneurysm, dissection or vasculitis. Renals: Patent single renal arteries bilaterally without appreciable high-grade stenosis. IMA: Moderate ostial IMA stenosis, stable since 07/05/2016. No aneurysm, dissection or vasculitis. Inflow: Patent without evidence of aneurysm, dissection, vasculitis or significant stenosis. Veins: No obvious venous abnormality within the limitations of this arterial phase study. Review of the MIP images confirms the above findings. NON-VASCULAR Hepatobiliary: Normal liver with no liver mass. Normal gallbladder with no radiopaque cholelithiasis. No biliary ductal dilatation. Stable small periampullary duodenal diverticulum. Pancreas: Normal, with no mass or duct dilation. Spleen: Normal size. No mass. Adrenals/Urinary Tract: Normal adrenals. No hydronephrosis. No contour deforming renal mass. Normal bladder. Stomach/Bowel: Small hiatal hernia. Otherwise collapsed and grossly normal stomach. Normal caliber small bowel with no small bowel wall thickening. Appendix not discretely visualized. No pericecal inflammatory changes. Moderate sigmoid diverticulosis, with no large bowel wall thickening or pericolonic fat stranding.  Vascular/Lymphatic: Atherosclerotic nonaneurysmal abdominal aorta. No pathologically enlarged lymph nodes in the abdomen or pelvis. Reproductive: Status post hysterectomy, with no abnormal findings at the vaginal cuff. No adnexal mass. Other: No pneumoperitoneum, ascites or focal fluid collection. Musculoskeletal: No aggressive appearing focal osseous lesions. Marked lumbar spondylosis. Review of the MIP images confirms the above findings. IMPRESSION: 1. No acute aortic syndrome.  Atherosclerotic nonaneurysmal aorta. 2. Mild subacute T4 vertebral compression fracture, stable since 07/05/2016, new since 02/17/2013. 3. Solitary solid 6 mm right lower lobe pulmonary nodule. Non-contrast chest CT at 6-12 months is recommended. If the nodule is stable at time of repeat CT, then future CT at 18-24 months (from today's scan) is considered optional for low-risk patients, but is recommended for high-risk patients. This recommendation follows the consensus statement: Guidelines for Management of Incidental Pulmonary Nodules Detected on CT  Images: From the Fleischner Society 2017; Radiology 2017; 284:228-243. 4. High-grade proximal celiac and SMA stenoses, stable since 07/05/2016. 5. Small hiatal hernia. 6. Moderate sigmoid diverticulosis. Electronically Signed   By: Ilona Sorrel M.D.   On: 07/14/2016 19:21        Scheduled Meds: . albuterol  2.5 mg Inhalation BID  . atorvastatin  10 mg Oral Q breakfast  . docusate sodium  200 mg Oral QHS  . doxycycline (VIBRAMYCIN) IV  100 mg Intravenous Q12H  . heparin  5,000 Units Subcutaneous Q8H  . insulin aspart  0-9 Units Subcutaneous TID WC  . levothyroxine  50 mcg Oral QAC breakfast  . losartan  50 mg Oral Daily  . mometasone-formoterol  2 puff Inhalation BID  . montelukast  10 mg Oral Daily  . multivitamin  1 tablet Oral BID  . pantoprazole  40 mg Oral Daily  . polycarbophil  1,250 mg Oral Daily  . potassium chloride  10 mEq Oral Daily   Continuous  Infusions:   LOS: 0 days    Time spent: 25 min    [place], DO [facility] Pager [phone]  If 7PM-7AM, please contact night-coverage [email or web address] Password [email or web address] 07/15/2016, 1:28 PM

## 2016-07-15 NOTE — Progress Notes (Signed)
Pt educated about safety and importance of bed alarm during the night however pt refuses to be on bed alarm. Will continue to round on patient.   Dontavia Brand, RN    

## 2016-07-15 NOTE — Progress Notes (Signed)
Patient resting comfortably in bed, with visitor at bedside. Pt awaiting radiology study.

## 2016-07-15 NOTE — Consult Note (Signed)
   [facility] CM Inpatient Consult   07/15/2016  NIKOLINA SIMERSON 1928-06-03 [id number]  Patient assessed for re-hospitalization.  Came by to see patient but she is currently out of the unit.  Will follow up as needed.  For questions, please contact:  Natividad Brood, RN BSN [facility] Liaison  [phone] business mobile phone Toll free office [phone]

## 2016-07-16 ENCOUNTER — Inpatient Hospital Stay (HOSPITAL_COMMUNITY): Payer: Medicare Other

## 2016-07-16 DIAGNOSIS — N182 Chronic kidney disease, stage 2 (mild): Secondary | ICD-10-CM

## 2016-07-16 DIAGNOSIS — R1310 Dysphagia, unspecified: Secondary | ICD-10-CM

## 2016-07-16 LAB — CBC
HCT: 33.8 % — ABNORMAL LOW (ref 36.0–46.0)
HEMOGLOBIN: 11.3 g/dL — AB (ref 12.0–15.0)
MCH: 30.4 pg (ref 26.0–34.0)
MCHC: 33.4 g/dL (ref 30.0–36.0)
MCV: 90.9 fL (ref 78.0–100.0)
PLATELETS: 217 10*3/uL (ref 150–400)
RBC: 3.72 MIL/uL — ABNORMAL LOW (ref 3.87–5.11)
RDW: 14.1 % (ref 11.5–15.5)
WBC: 7.8 10*3/uL (ref 4.0–10.5)

## 2016-07-16 LAB — URINALYSIS, ROUTINE W REFLEX MICROSCOPIC
Bilirubin Urine: NEGATIVE
GLUCOSE, UA: NEGATIVE mg/dL
HGB URINE DIPSTICK: NEGATIVE
Ketones, ur: NEGATIVE mg/dL
Leukocytes, UA: NEGATIVE
Nitrite: NEGATIVE
PROTEIN: NEGATIVE mg/dL
Specific Gravity, Urine: 1.015 (ref 1.005–1.030)
pH: 6 (ref 5.0–8.0)

## 2016-07-16 LAB — GLUCOSE, CAPILLARY
GLUCOSE-CAPILLARY: 158 mg/dL — AB (ref 65–99)
GLUCOSE-CAPILLARY: 85 mg/dL (ref 65–99)
Glucose-Capillary: 182 mg/dL — ABNORMAL HIGH (ref 65–99)
Glucose-Capillary: 156 mg/dL — ABNORMAL HIGH (ref 65–99)

## 2016-07-16 LAB — BASIC METABOLIC PANEL
Anion gap: 7 (ref 5–15)
BUN: 12 mg/dL (ref 6–20)
CALCIUM: 9 mg/dL (ref 8.9–10.3)
CHLORIDE: 100 mmol/L — AB (ref 101–111)
CO2: 27 mmol/L (ref 22–32)
CREATININE: 1.23 mg/dL — AB (ref 0.44–1.00)
GFR, EST AFRICAN AMERICAN: 45 mL/min — AB (ref >60)
GFR, EST NON AFRICAN AMERICAN: 39 mL/min — AB (ref >60)
Glucose, Bld: 149 mg/dL — ABNORMAL HIGH (ref 65–99)
Potassium: 4.3 mmol/L (ref 3.5–5.1)
SODIUM: 134 mmol/L — AB (ref 135–145)

## 2016-07-16 MED ORDER — AMLODIPINE BESYLATE 5 MG PO TABS
5.0000 mg | ORAL_TABLET | Freq: Every day | ORAL | Status: DC
  Administered 2016-07-16 – 2016-07-17 (×2): 5 mg via ORAL
  Filled 2016-07-16 (×2): qty 1

## 2016-07-16 MED ORDER — MORPHINE SULFATE (PF) 2 MG/ML IV SOLN
1.0000 mg | Freq: Once | INTRAVENOUS | Status: AC
  Administered 2016-07-16: 1 mg via INTRAVENOUS
  Filled 2016-07-16: qty 1

## 2016-07-16 MED ORDER — DICLOFENAC SODIUM 1 % TD GEL
2.0000 g | Freq: Four times a day (QID) | TRANSDERMAL | Status: DC
  Administered 2016-07-16 – 2016-07-20 (×17): 2 g via TOPICAL
  Filled 2016-07-16: qty 100

## 2016-07-16 MED ORDER — LOSARTAN POTASSIUM 50 MG PO TABS
100.0000 mg | ORAL_TABLET | Freq: Every day | ORAL | Status: DC
  Administered 2016-07-16 – 2016-07-17 (×2): 100 mg via ORAL
  Filled 2016-07-16 (×2): qty 2

## 2016-07-16 MED ORDER — HYDROMORPHONE HCL 1 MG/ML IJ SOLN: 1.0000 mg | Freq: Once | INTRAMUSCULAR | Status: DC

## 2016-07-16 NOTE — Progress Notes (Addendum)
Informed patient that another pain medication is ordered for her however she stated that her pain is easing off and she do not want Hydromorphone at this moment. Will continue to monitor.   Hughes Wyndham, RN

## 2016-07-16 NOTE — Evaluation (Signed)
Physical Therapy Evaluation Patient Details Name: Sandra Cuevas MRN: [id number] DOB: [birth year] Today's Date: 07/16/2016   History of Present Illness  Patient is an 83 yo female admitted 07/14/16 with chest pain going into back between scapula.  Patient also with Rt thigh cellulitis with pain moving into hip.  Patient with hypertensive urgency.   PMH:  CHF, cellulitis Rt thigh, HLD, CKD, DM, T4 compression fx (subacute)  Clinical Impression  Patient presents with problems listed below.  Will benefit from acute PT to maximize functional independence prior to discharge home alone with support of family.  Patient will need to function at Mod I level to return home safely.    Follow Up Recommendations No PT follow up;Supervision - Intermittent (Patient declined HHPT)    Equipment Recommendations  None recommended by PT    Recommendations for Other Services       Precautions / Restrictions Precautions Precautions: Fall Restrictions Weight Bearing Restrictions: No      Mobility  Bed Mobility               General bed mobility comments: Patient sitting in chair  Transfers Overall transfer level: Modified independent Equipment used: Rolling walker (2 wheeled)             General transfer comment: Increased time.  Ambulation/Gait Ambulation/Gait assistance: Supervision Ambulation Distance (Feet): 110 Feet Assistive device: Rolling walker (2 wheeled) Gait Pattern/deviations: Step-through pattern;Decreased stride length;Decreased weight shift to right;Antalgic;Trunk flexed Gait velocity: decreased Gait velocity interpretation: Below normal speed for age/gender General Gait Details: Patient with safe use of RW. Slow, steady gait with RW.  Antalgic on RLE.  No loss of balance.  Stairs            Wheelchair Mobility    Modified Rankin (Stroke Patients Only)       Balance Overall balance assessment: Needs assistance         Standing balance support: No upper  extremity supported Standing balance-Leahy Scale: Fair Standing balance comment: UE support for dynamic activities                             Pertinent Vitals/Pain Pain Assessment: 0-10 Pain Score: 8  Pain Location: Rt thigh and hip Pain Descriptors / Indicators: Aching;Sore Pain Intervention(s): Monitored during session;Repositioned    Home Living Family/patient expects to be discharged to:: Private residence Living Arrangements: Alone Available Help at Discharge: Family;Available PRN/intermittently (Daughter and son live nearby) Type of Home: House Home Access: Stairs to enter Entrance Stairs-Rails: None Entrance Stairs-Number of Steps: 1 Home Layout: One level Home Equipment: Environmental consultant - 2 wheels;Cane - single point;Bedside commode;Shower seat (Shower seats do not fit in her shower.)      Prior Function Level of Independence: Independent with assistive device(s)         Comments: Patient uses RW in house, and cane outside.  Drives.  Takes sponge baths.     Hand Dominance        Extremity/Trunk Assessment   Upper Extremity Assessment Upper Extremity Assessment: Overall WFL for tasks assessed    Lower Extremity Assessment Lower Extremity Assessment: Generalized weakness       Communication   Communication: No difficulties  Cognition Arousal/Alertness: Awake/alert Behavior During Therapy: WFL for tasks assessed/performed Overall Cognitive Status: Within Functional Limits for tasks assessed  General Comments      Exercises     Assessment/Plan    PT Assessment Patient needs continued PT services  PT Problem List Decreased strength;Decreased activity tolerance;Decreased balance;Decreased mobility;Cardiopulmonary status limiting activity;Pain       PT Treatment Interventions DME instruction;Gait training;Functional mobility training;Therapeutic activities;Therapeutic exercise;Balance  training;Patient/family education    PT Goals (Current goals can be found in the Care Plan section)  Acute Rehab PT Goals Patient Stated Goal: To decrease pain in Rt thigh PT Goal Formulation: With patient/family Time For Goal Achievement: 07/23/16 Potential to Achieve Goals: Good    Frequency Min 3X/week   Barriers to discharge Decreased caregiver support Patient lives alone.    Co-evaluation               End of Session Equipment Utilized During Treatment: Gait belt Activity Tolerance: Patient tolerated treatment well Patient left: in chair;with call bell/phone within reach;with family/visitor present Nurse Communication: Mobility status PT Visit Diagnosis: Unsteadiness on feet (R26.81);Difficulty in walking, not elsewhere classified (R26.2);Muscle weakness (generalized) (M62.81);Pain Pain - Right/Left: Right Pain - part of body: Leg    Time: 1439-1501 PT Time Calculation (min) (ACUTE ONLY): 22 min   Charges:   PT Evaluation $PT Eval Moderate Complexity: 1 Procedure     PT G Codes:        Carita Pian. Sanjuana Kava, [facility] Acute Rehab Services Pager [place] 07/16/2016, 4:10 PM

## 2016-07-16 NOTE — Progress Notes (Addendum)
Morphine 1 mg ordered.   Hayze Gazda, RN

## 2016-07-16 NOTE — Progress Notes (Signed)
PROGRESS NOTE    Sandra Cuevas  DZH:[id number] DOB: 17-Aug-1928 DOA: 07/14/2016 PCP: Purvis Kilts, MD   Outpatient Specialists:     Brief Narrative:  Patient presents with chest pressure in center of chest, radiates into back last night, but no radiation currently. Feels SOB, like she has to exert herself to talk, although no accessory muscles used during exam. BP severely elevated, improved with IV hydralazine. BP (!) 192/62   Pulse 65   Temp 98.2 F (36.8 C)   Resp (!) 22   SpO2 95%  awake and oriented 3, in no distress, lungs clear to auscultation. Regular rate and normal S1-S2 sounds. Abdomen soft, non-tender. No edema peripherally. Good pulses. Will check CTA of chest. Serial troponins. Continue doxycycline for cellulitis   Assessment & Plan:   Principal Problem:   Chest pain Active Problems:   Hypothyroidism   Chronic diastolic CHF (congestive heart failure) (HCC)   Cellulitis of right leg   Hypertensive urgency   Diabetes mellitus, type II (HCC)   CKD (chronic kidney disease), stage II   Chest pain -suspect GI related vs subacute compression fracture -CE negative -CTA done of chest no anuerysm  Hiatal hernia/GERD -has ENT appointment for hoarse voice -+belching -check DG esophagus does not show stricture -PPI BID-- may need additional agent  HTN urgency -? Missed doses of medication at home -continue home meds- BP controlled  Right thigh cellulitis -much improved per patient -continue doxy IV NOT PO as suspect this may be contributing to the patient's symptoms  Right hip pain X ray ok -check U/A for possible UTI causing pain  subactue t4 compression fracture -stable  solid 6 mm right lower lobe pulmonary nodule.  Non-contrast chest CT at 6-12 months is recommended. If the nodule is stable at time of repeat CT, then future CT at 18-24 months (from today's scan) is considered optional for low-risk patients, but is recommended for high-risk  patients.  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 -outpatient follow up  DVT prophylaxis:  SQ Heparin  Code Status: Full Code   Family Communication: daughter  Disposition Plan:     Consultants:      Subjective: Not having chest pain- eating ok but now c/o pain where cellulitis going into her right hip and back   Objective: Vitals:   07/15/16 2128 07/16/16 0447 07/16/16 0538 07/16/16 0824  BP:  (!) 159/51    Pulse:  63    Resp:  16    Temp:  98.3 F (36.8 C) 97.7 F (36.5 C)   TempSrc:  Oral Oral   SpO2: 94% 92%  97%  Weight:  92.3 kg (203 lb 8 oz)    Height:        Intake/Output Summary (Last 24 hours) at 07/16/16 1357 Last data filed at 07/16/16 0900  Gross per 24 hour  Intake              390 ml  Output             2100 ml  Net            -1710 ml   Filed  Weights   07/14/16 1818 07/15/16 0507 07/16/16 0447  Weight: 92.7 kg (204 lb 4.8 oz) 92.2 kg (203 lb 3.2 oz) 92.3 kg (203 lb 8 oz)    Examination:  General exam: Appears calm and comfortable - sitting on side if bed Respiratory system: Clear to auscultation but diminished Cardiovascular system: rrr. No JVD, murmurs, rubs, gallops or clicks. No pedal edema. Gastrointestinal system: Abdomen is nondistended, soft and nontender. No organomegaly or masses felt. Normal bowel sounds heard. Central nervous system: Alert and oriented. No focal neurological deficits. Minimal redness on her upper thigh   Data Reviewed: I have personally reviewed following labs and imaging studies  CBC:  Recent Labs Lab 07/12/16 0403 07/14/16 1143 07/14/16 1619  07/14/16 1723 07/16/16 0738  WBC 7.7 6.9  --  6.8 7.8  NEUTROABS 5.2 4.4 3.7  --   --   HGB 10.2* 10.8*  --  10.7* 11.3*  HCT 30.6* 32.7*  --  32.5* 33.8*  MCV 92.4 91.1  --  90.8 90.9  PLT 198 225  --  211 119   Basic Metabolic Panel:  Recent Labs Lab 07/12/16 0403 07/14/16 1143 07/14/16 1723 07/16/16 0738  NA 135 134*  --  134*  K 4.4 4.2  --  4.3  CL 102 96*  --  100*  CO2 28 29  --  27  GLUCOSE 127* 153*  --  149*  BUN 20 12  --  12  CREATININE 1.11* 1.06* 1.07* 1.23*  CALCIUM 8.8* 9.0  --  9.0   GFR: Estimated Creatinine Clearance: 37.6 mL/min (A) (by C-G formula based on SCr of 1.23 mg/dL (H)). Liver Function Tests:  Recent Labs Lab 07/12/16 0403 07/14/16 1132  AST 17 21  ALT 16 15  ALKPHOS 74 68  BILITOT 0.3 0.4  PROT 6.2* 6.7  ALBUMIN 3.1* 2.8*   No results for input(s): LIPASE, AMYLASE in the last 168 hours. No results for input(s): AMMONIA in the last 168 hours. Coagulation Profile: No results for input(s): INR, PROTIME in the last 168 hours. Cardiac Enzymes:  Recent Labs Lab 07/12/16 0803 07/14/16 1723  TROPONINI <0.03 <0.03   BNP (last 3 results) No results for input(s): PROBNP in the last 8760 hours. HbA1C: No results for input(s): HGBA1C in the last 72 hours. CBG:  Recent Labs Lab 07/15/16 1355 07/15/16 1631 07/15/16 2048 07/16/16 0756 07/16/16 1145  GLUCAP 106* 190* 115* 158* 85   Lipid Profile: No results for input(s): CHOL, HDL, LDLCALC, TRIG, CHOLHDL, LDLDIRECT in the last 72 hours. Thyroid Function Tests: No results for input(s): TSH, T4TOTAL, FREET4, T3FREE, THYROIDAB in the last 72 hours. Anemia Panel: No results for input(s): VITAMINB12, FOLATE, FERRITIN, TIBC, IRON, RETICCTPCT in the last 72 hours. Urine analysis:    Component Value Date/Time   COLORURINE YELLOW 12/28/2009 0622   APPEARANCEUR CLOUDY (A) 12/28/2009 0622   LABSPEC 1.021 12/28/2009 0622   PHURINE 5.0 12/28/2009 0622   GLUCOSEU NEGATIVE 12/28/2009  0622   HGBUR NEGATIVE 12/28/2009 0622   BILIRUBINUR NEGATIVE 12/28/2009 0622   KETONESUR NEGATIVE 12/28/2009 0622   PROTEINUR NEGATIVE 12/28/2009 0622   UROBILINOGEN 0.2 12/28/2009 0622   NITRITE NEGATIVE 12/28/2009 0622   LEUKOCYTESUR MODERATE (A) 12/28/2009 0622     )No results found for this or any previous visit (from the past 240 hour(s)).    Anti-infectives    Start     Dose/Rate Route Frequency Ordered Stop   07/14/16 1800  doxycycline (VIBRAMYCIN) 100 mg in dextrose 5 %  250 mL IVPB     100 mg 125 mL/hr over 120 Minutes Intravenous Every 12 hours 07/14/16 1704         Radiology Studies: Dg Esophagus  Result Date: 07/15/2016 CLINICAL DATA:  Dysphagia, hoarseness EXAM: ESOPHOGRAM/BARIUM SWALLOW TECHNIQUE: Single contrast examination was performed using thin barium and barium tablet. FLUOROSCOPY TIME:  Fluoroscopy Time:  0 minutes 54 second Radiation Exposure Index (if provided by the fluoroscopic device): Number of Acquired Spot Images: 0 COMPARISON:  None. FINDINGS: Mild decrease in esophageal peristalsis. Esophagus mildly distended. Negative for stricture or mass. No mucosal edema or ulceration. No aspiration identified. Negative for hiatal hernia. Barium tablet passed readily into the stomach. IMPRESSION: Mild decrease in esophageal peristalsis. Negative for stricture or hiatal hernia. Barium tablet passed readily into the stomach. Electronically Signed   By: Franchot Gallo M.D.   On: 07/15/2016 13:27   Ct Angio Chest/abd/pel For Dissection W And/or W/wo  Result Date: 07/14/2016 CLINICAL DATA:  Inpatient.  Chest pain. EXAM: CT ANGIOGRAPHY CHEST, ABDOMEN AND PELVIS TECHNIQUE: Multidetector CT imaging through the chest, abdomen and pelvis was performed using the standard protocol during bolus administration of intravenous contrast. Multiplanar reconstructed images and MIPs were obtained and reviewed to evaluate the vascular anatomy. CONTRAST:  100 cc Isovue 370 IV. COMPARISON:   Chest radiograph from earlier today. 07/05/2016 CT angiogram of the chest, abdomen and pelvis. FINDINGS: CTA CHEST FINDINGS Cardiovascular: Normal heart size. No significant pericardial fluid/thickening. Atherosclerotic nonaneurysmal thoracic aorta. No acute intramural hematoma, dissection, pseudoaneurysm or penetrating atherosclerotic ulcer in the thoracic aorta. Aortic arch branch vessels are patent. Stable top-normal caliber pulmonary arteries (main pulmonary artery diameter 3.2 cm). No central pulmonary emboli. Mediastinum/Nodes: No discrete thyroid nodules. Unremarkable esophagus. No pathologically enlarged axillary, mediastinal or hilar lymph nodes. Lungs/Pleura: No pneumothorax. No pleural effusion. Solid 6 mm peripheral right lower lobe pulmonary nodule (series 8/ image 98), stable since 07/05/2016, increased from 3 mm on 02/17/2013. No acute consolidative airspace disease, lung masses or additional significant pulmonary nodules. Mosaic attenuation throughout both lungs, unchanged. Musculoskeletal: No aggressive appearing focal osseous lesions. Mild superior T4 vertebral compression fracture is stable since 07/05/2016, new since 02/17/2013. Moderate thoracic spondylosis. Review of the MIP images confirms the above findings. CTA ABDOMEN AND PELVIS FINDINGS VASCULAR Aorta: Atherosclerotic nonaneurysmal abdominal aorta with no dissection, vasculitis or significant stenosis. Celiac: High-grade proximal celiac artery stenosis, stable since 07/05/2016. No aneurysm, dissection or vasculitis. SMA: Moderate to high-grade SMA ostial stenosis, stable since 07/05/2016. No aneurysm, dissection or vasculitis. Renals: Patent single renal arteries bilaterally without appreciable high-grade stenosis. IMA: Moderate ostial IMA stenosis, stable since 07/05/2016. No aneurysm, dissection or vasculitis. Inflow: Patent without evidence of aneurysm, dissection, vasculitis or significant stenosis. Veins: No obvious venous abnormality  within the limitations of this arterial phase study. Review of the MIP images confirms the above findings. NON-VASCULAR Hepatobiliary: Normal liver with no liver mass. Normal gallbladder with no radiopaque cholelithiasis. No biliary ductal dilatation. Stable small periampullary duodenal diverticulum. Pancreas: Normal, with no mass or duct dilation. Spleen: Normal size. No mass. Adrenals/Urinary Tract: Normal adrenals. No hydronephrosis. No contour deforming renal mass. Normal bladder. Stomach/Bowel: Small hiatal hernia. Otherwise collapsed and grossly normal stomach. Normal caliber small bowel with no small bowel wall thickening. Appendix not discretely visualized. No pericecal inflammatory changes. Moderate sigmoid diverticulosis, with no large bowel wall thickening or pericolonic fat stranding. Vascular/Lymphatic: Atherosclerotic nonaneurysmal abdominal aorta. No pathologically enlarged lymph nodes in the abdomen or pelvis. Reproductive: Status post hysterectomy, with no abnormal findings  at the vaginal cuff. No adnexal mass. Other: No pneumoperitoneum, ascites or focal fluid collection. Musculoskeletal: No aggressive appearing focal osseous lesions. Marked lumbar spondylosis. Review of the MIP images confirms the above findings. IMPRESSION: 1. No acute aortic syndrome.  Atherosclerotic nonaneurysmal aorta. 2. Mild subacute T4 vertebral compression fracture, stable since 07/05/2016, new since 02/17/2013. 3. Solitary solid 6 mm right lower lobe pulmonary nodule. Non-contrast chest CT at 6-12 months is recommended. If the nodule is stable at time of repeat CT, then future CT at 18-24 months (from today's scan) is considered optional for low-risk patients, but is recommended for high-risk patients. This recommendation follows the consensus statement: Guidelines for Management of Incidental Pulmonary Nodules Detected on CT Images: From the Fleischner Society 2017; Radiology 2017; 284:228-243. 4. High-grade proximal  celiac and SMA stenoses, stable since 07/05/2016. 5. Small hiatal hernia. 6. Moderate sigmoid diverticulosis. Electronically Signed   By: Ilona Sorrel M.D.   On: 07/14/2016 19:21   Dg Hip Unilat With Pelvis 2-3 Views Right  Result Date: 07/16/2016 CLINICAL DATA:  Right hip pain without known injury. EXAM: DG HIP (WITH OR WITHOUT PELVIS) 2-3V RIGHT COMPARISON:  None. FINDINGS: There is no evidence of hip fracture or dislocation. There is no evidence of arthropathy or other focal bone abnormality. IMPRESSION: Normal right hip. Electronically Signed   By: Marijo Conception, M.D.   On: 07/16/2016 10:55        Scheduled Meds: . albuterol  2.5 mg Inhalation BID  . amLODipine  5 mg Oral Daily  . atorvastatin  10 mg Oral Q breakfast  . diclofenac sodium  2 g Topical QID  . docusate sodium  200 mg Oral QHS  . doxycycline (VIBRAMYCIN) IV  100 mg Intravenous Q12H  . heparin  5,000 Units Subcutaneous Q8H  . insulin aspart  0-9 Units Subcutaneous TID WC  . levothyroxine  50 mcg Oral QAC breakfast  . losartan  100 mg Oral Daily  . mometasone-formoterol  2 puff Inhalation BID  . montelukast  10 mg Oral Daily  . multivitamin  1 tablet Oral BID  . pantoprazole  40 mg Oral BID  . polycarbophil  1,250 mg Oral Daily  . potassium chloride  10 mEq Oral Daily  . sucralfate  1 g Oral TID WC & HS   Continuous Infusions:   LOS: 1 day    Time spent: 25 min    [place], DO [facility] Pager [phone]  If 7PM-7AM, please contact night-coverage [email or web address] Password [email or web address] 07/16/2016, 1:57 PM

## 2016-07-17 ENCOUNTER — Inpatient Hospital Stay (HOSPITAL_COMMUNITY): Payer: Medicare Other

## 2016-07-17 DIAGNOSIS — N179 Acute kidney failure, unspecified: Secondary | ICD-10-CM

## 2016-07-17 DIAGNOSIS — K219 Gastro-esophageal reflux disease without esophagitis: Secondary | ICD-10-CM

## 2016-07-17 LAB — GLUCOSE, CAPILLARY
Glucose-Capillary: 139 mg/dL — ABNORMAL HIGH (ref 65–99)
Glucose-Capillary: 119 mg/dL — ABNORMAL HIGH (ref 65–99)
Glucose-Capillary: 134 mg/dL — ABNORMAL HIGH (ref 65–99)
Glucose-Capillary: 111 mg/dL — ABNORMAL HIGH (ref 65–99)

## 2016-07-17 LAB — BASIC METABOLIC PANEL
ANION GAP: 8 (ref 5–15)
BUN: 17 mg/dL (ref 6–20)
CHLORIDE: 100 mmol/L — AB (ref 101–111)
CO2: 28 mmol/L (ref 22–32)
CREATININE: 1.41 mg/dL — AB (ref 0.44–1.00)
Calcium: 8.7 mg/dL — ABNORMAL LOW (ref 8.9–10.3)
GFR, EST AFRICAN AMERICAN: 38 mL/min — AB (ref >60)
GFR, EST NON AFRICAN AMERICAN: 33 mL/min — AB (ref >60)
GLUCOSE: 112 mg/dL — AB (ref 65–99)
POTASSIUM: 4.6 mmol/L (ref 3.5–5.1)
Sodium: 136 mmol/L (ref 135–145)

## 2016-07-17 LAB — CBC
HEMATOCRIT: 30.7 % — AB (ref 36.0–46.0)
HEMOGLOBIN: 9.9 g/dL — AB (ref 12.0–15.0)
MCH: 29.6 pg (ref 26.0–34.0)
MCHC: 32.2 g/dL (ref 30.0–36.0)
MCV: 91.9 fL (ref 78.0–100.0)
Platelets: 213 10*3/uL (ref 150–400)
RBC: 3.34 MIL/uL — AB (ref 3.87–5.11)
RDW: 13.9 % (ref 11.5–15.5)
WBC: 6.6 10*3/uL (ref 4.0–10.5)

## 2016-07-17 MED ORDER — DOXYCYCLINE HYCLATE 100 MG PO TABS: 100.0000 mg | ORAL_TABLET | Freq: Two times a day (BID) | ORAL | Status: DC

## 2016-07-17 MED ORDER — SODIUM CHLORIDE 0.9 % IV SOLN
INTRAVENOUS | Status: DC
  Administered 2016-07-17 – 2016-07-18 (×2): via INTRAVENOUS

## 2016-07-17 MED ORDER — BISACODYL 10 MG RE SUPP
10.0000 mg | Freq: Every day | RECTAL | Status: DC | PRN
  Administered 2016-07-18: 10 mg via RECTAL
  Filled 2016-07-17: qty 1

## 2016-07-17 MED ORDER — AMLODIPINE BESYLATE 10 MG PO TABS
10.0000 mg | ORAL_TABLET | Freq: Every day | ORAL | Status: DC
  Administered 2016-07-18 – 2016-07-20 (×3): 10 mg via ORAL
  Filled 2016-07-17 (×3): qty 1

## 2016-07-17 MED ORDER — LIDOCAINE 5 % EX PTCH
1.0000 | MEDICATED_PATCH | CUTANEOUS | Status: DC
  Administered 2016-07-17 – 2016-07-20 (×4): 1 via TRANSDERMAL
  Filled 2016-07-17 (×4): qty 1

## 2016-07-17 MED ORDER — POLYETHYLENE GLYCOL 3350 17 G PO PACK
17.0000 g | PACK | Freq: Every day | ORAL | Status: DC
  Administered 2016-07-17 – 2016-07-20 (×4): 17 g via ORAL
  Filled 2016-07-17 (×4): qty 1

## 2016-07-17 MED ORDER — HYDRALAZINE HCL 25 MG PO TABS
25.0000 mg | ORAL_TABLET | Freq: Three times a day (TID) | ORAL | Status: DC
  Administered 2016-07-17 – 2016-07-20 (×9): 25 mg via ORAL
  Filled 2016-07-17 (×9): qty 1

## 2016-07-17 MED ORDER — SENNOSIDES-DOCUSATE SODIUM 8.6-50 MG PO TABS
2.0000 | ORAL_TABLET | Freq: Two times a day (BID) | ORAL | Status: DC
  Administered 2016-07-17 – 2016-07-20 (×6): 2 via ORAL
  Filled 2016-07-17 (×6): qty 2

## 2016-07-17 NOTE — Progress Notes (Signed)
PROGRESS NOTE    Sandra Cuevas  ZMO:[id number] DOB: [birth year] DOA: 07/14/2016 PCP: Purvis Kilts, MD   Outpatient Specialists:     Brief Narrative:  Patient presents with chest pressure in center of chest, radiates into back last night, but no radiation currently. Feels SOB, like she has to exert herself to talk, although no accessory muscles used during exam. BP severely elevated.  Patient has multiple complaints chest pain, back pain, hip pain.  Slowly improving.  Assessment & Plan:   Principal Problem:   Chest pain Active Problems:   Hypothyroidism   Chronic diastolic CHF (congestive heart failure) (HCC)   Cellulitis of right leg   Hypertensive urgency   Diabetes mellitus, type II (HCC)   CKD (chronic kidney disease), stage II  AKI -increased after increase of ARB from 50 to 100 (had incidental finding of some renal a stenosis but not severe-- to see Bridgett Larsson as outpatient) -IVF and recheck BMP in AM -ARB stopped -patient was given 2 CTAs in last week -renal U/S  Chest pain -suspect GI related vs subacute compression fracture -CE negative -CTA done of chest no aneurysm x 2-- 3/30 and 4/8 -improve with Carafate/PPI BID  Hiatal hernia/GERD -has ENT appointment for hoarse voice (improving with GERD treatment) -+belching -check DG esophagus does not show stricture -PPI BID  HTN urgency -? Missed doses of medication at home -increase norvasc, hold ARB due to increasing Cr- added hydralazine  Right thigh cellulitis (odd distribution for cellulitis) -much improved  -treated with doxy -no sign of zoster  Right hip pain X ray ok -had been worked up in past -improved with Voltaren cream  subactue t4 compression fracture -stable -lidocaine patch  solid 6 mm right lower lobe pulmonary nodule.  Non-contrast chest CT at 6-12 months is recommended. If the nodule is stable at time of repeat CT, then future CT at 18-24 months (from today's scan) is considered  optional for low-risk patients, but is recommended for high-risk patients.  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 Bridgett Larsson  DVT prophylaxis:  SQ Heparin  Code Status: Full Code   Family Communication: Daughter 4/10  Disposition Plan:     Consultants:      Subjective: Feeling better today, pain much improved   Objective: Vitals:   07/16/16 2135 07/17/16 0423 07/17/16 0834 07/17/16 1227  BP: (!) 127/49 (!) 136/57  (!) 152/51  Pulse: 76 67  (!) 54  Resp: 20 20  18   Temp: 98.4 F (36.9 C) 98.2 F (36.8 C)  98.3 F (36.8 C)  TempSrc: Oral Oral  Oral  SpO2: 94% 94% 95% 94%  Weight:  92.9 kg (204 lb 11.2 oz)    Height:        Intake/Output Summary (Last 24 hours) at 07/17/16 1538 Last data filed at 07/17/16 1402  Gross per 24 hour  Intake              650 ml  Output             1000 ml  Net             -350 ml   [employer]  07/15/16 0507 07/16/16 0447 07/17/16 0423  Weight: 92.2 kg (203 lb 3.2 oz) 92.3 kg (203 lb 8 oz) 92.9 kg (204 lb 11.2 oz)    Examination:  General exam: Appears calm and comfortable - sitting in chair- had gone for walk in hallway earlier Respiratory system: Clear to auscultation but diminished Cardiovascular system: rrr. No JVD, murmurs, rubs, gallops or clicks. No pedal edema. Gastrointestinal system: Abdomen is nondistended, soft and nontender. No organomegaly or masses felt. Normal bowel sounds heard. Central nervous system: Alert and oriented. No focal neurological deficits. No further rash or redness on right thigh   Data Reviewed: I have personally reviewed following labs and imaging  studies  CBC:  Recent Labs Lab 07/12/16 0403 07/14/16 1143 07/14/16 1619 07/14/16 1723 07/16/16 0738 07/17/16 0306  WBC 7.7 6.9  --  6.8 7.8 6.6  NEUTROABS 5.2 4.4 3.7  --   --   --   HGB 10.2* 10.8*  --  10.7* 11.3* 9.9*  HCT 30.6* 32.7*  --  32.5* 33.8* 30.7*  MCV 92.4 91.1  --  90.8 90.9 91.9  PLT 198 225  --  211 217 465   Basic Metabolic Panel:  Recent Labs Lab 07/12/16 0403 07/14/16 1143 07/14/16 1723 07/16/16 0738 07/17/16 0306  NA 135 134*  --  134* 136  K 4.4 4.2  --  4.3 4.6  CL 102 96*  --  100* 100*  CO2 28 29  --  27 28  GLUCOSE 127* 153*  --  149* 112*  BUN 20 12  --  12 17  CREATININE 1.11* 1.06* 1.07* 1.23* 1.41*  CALCIUM 8.8* 9.0  --  9.0 8.7*   GFR: Estimated Creatinine Clearance: 32.9 mL/min (A) (by C-G formula based on SCr of 1.41 mg/dL (H)). Liver Function Tests:  Recent Labs Lab 07/12/16 0403 07/14/16 1132  AST 17 21  ALT 16 15  ALKPHOS 74 68  BILITOT 0.3 0.4  PROT 6.2* 6.7  ALBUMIN 3.1* 2.8*   No results for input(s): LIPASE, AMYLASE in the last 168 hours. No results for input(s): AMMONIA in the last 168 hours. Coagulation Profile: No results for input(s): INR, PROTIME in the last 168 hours. Cardiac Enzymes:  Recent Labs Lab 07/12/16 0803 07/14/16 1723  TROPONINI <0.03 <0.03   BNP (last 3 results) No results for input(s): PROBNP in the last 8760 hours. HbA1C: No results for input(s): HGBA1C in the last 72 hours. CBG:  Recent Labs Lab 07/16/16 1145 07/16/16 1651 07/16/16 2155 07/17/16 0751 07/17/16 1131  GLUCAP 85 182* 156* 139* 119*   Lipid Profile: No results for input(s): CHOL, HDL, LDLCALC, TRIG, CHOLHDL, LDLDIRECT in the last 72 hours. Thyroid Function Tests: No results for input(s): TSH, T4TOTAL, FREET4, T3FREE, THYROIDAB in the last 72 hours. Anemia Panel: No results for input(s): VITAMINB12, FOLATE, FERRITIN, TIBC, IRON, RETICCTPCT in the last 72 hours. Urine analysis:    Component Value Date/Time    COLORURINE YELLOW 07/16/2016 1742   APPEARANCEUR CLEAR 07/16/2016 1742   LABSPEC 1.015 07/16/2016 1742   PHURINE 6.0 07/16/2016 1742   GLUCOSEU NEGATIVE 07/16/2016 1742   HGBUR NEGATIVE 07/16/2016 1742   BILIRUBINUR NEGATIVE 07/16/2016 1742   KETONESUR NEGATIVE 07/16/2016 1742   PROTEINUR NEGATIVE 07/16/2016 1742   UROBILINOGEN 0.2 12/28/2009 0622   NITRITE NEGATIVE 07/16/2016 1742   LEUKOCYTESUR NEGATIVE 07/16/2016 1742     )No results found for this or any previous visit (from the past 240 hour(s)).    Anti-infectives  Start     Dose/Rate Route Frequency Ordered Stop   07/17/16 2200  doxycycline (VIBRA-TABS) tablet 100 mg  Status:  Discontinued     100 mg Oral Every 12 hours 07/17/16 0928 07/17/16 1155   07/14/16 1800  doxycycline (VIBRAMYCIN) 100 mg in dextrose 5 % 250 mL IVPB  Status:  Discontinued     100 mg 125 mL/hr over 120 Minutes Intravenous Every 12 hours 07/14/16 1704 07/17/16 2947       Radiology Studies: Dg Hip Unilat With Pelvis 2-3 Views Right  Result Date: 07/16/2016 CLINICAL DATA:  Right hip pain without known injury. EXAM: DG HIP (WITH OR WITHOUT PELVIS) 2-3V RIGHT COMPARISON:  None. FINDINGS: There is no evidence of hip fracture or dislocation. There is no evidence of arthropathy or other focal bone abnormality. IMPRESSION: Normal right hip. Electronically Signed   By: Marijo Conception, M.D.   On: 07/16/2016 10:55        Scheduled Meds: . [START ON 07/18/2016] amLODipine  10 mg Oral Daily  . atorvastatin  10 mg Oral Q breakfast  . diclofenac sodium  2 g Topical QID  . docusate sodium  200 mg Oral QHS  . heparin  5,000 Units Subcutaneous Q8H  . hydrALAZINE  25 mg Oral Q8H  . insulin aspart  0-9 Units Subcutaneous TID WC  . levothyroxine  50 mcg Oral QAC breakfast  . lidocaine  1 patch Transdermal Q24H  . mometasone-formoterol  2 puff Inhalation BID  . montelukast  10 mg Oral Daily  . multivitamin  1 tablet Oral BID  . pantoprazole  40 mg Oral BID   . polycarbophil  1,250 mg Oral Daily  . potassium chloride  10 mEq Oral Daily  . sucralfate  1 g Oral TID WC & HS   Continuous Infusions: . sodium chloride 75 mL/hr at 07/17/16 1200     LOS: 2 days    Time spent: 25 min    [place], DO [facility] Pager [phone]  If 7PM-7AM, please contact night-coverage [email or web address] Password [facility] 07/17/2016, 3:38 PM

## 2016-07-17 NOTE — Progress Notes (Signed)
Physical Therapy Treatment Patient Details Name: Sandra Cuevas MRN: [id number] DOB: 08-08-1928 Today's Date: 07/17/2016    History of Present Illness Patient is an 83 yo female admitted 07/14/16 with chest pain going into back between scapula.  Patient also with Rt thigh cellulitis with pain moving into hip.  Patient with hypertensive urgency.   PMH:  CHF, cellulitis Rt thigh, HLD, CKD, DM, T4 compression fx (subacute)    PT Comments    Pt making good progress with mobility, increasing distance ambulated this session. PT will continue to follow pt acutely to ensure a safe d/c home.   Follow Up Recommendations  Supervision - Intermittent;Home health PT;Other (comment) (pt declining HHPT)     Equipment Recommendations  None recommended by PT    Recommendations for Other Services       Precautions / Restrictions Precautions Precautions: Fall Restrictions Weight Bearing Restrictions: No    Mobility  Bed Mobility               General bed mobility comments: pt exiting bathroom when therapist entered room  Transfers Overall transfer level: Modified independent Equipment used: Rolling walker (2 wheeled)             General transfer comment: Increased time.  Ambulation/Gait Ambulation/Gait assistance: Supervision Ambulation Distance (Feet): 200 Feet Assistive device: Rolling walker (2 wheeled) Gait Pattern/deviations: Step-through pattern;Decreased stride length;Decreased weight shift to right;Antalgic;Trunk flexed Gait velocity: decreased Gait velocity interpretation: Below normal speed for age/gender General Gait Details: Patient with safe use of RW. Slow, steady gait with RW.  Antalgic on RLE.  No loss of balance.   Stairs            Wheelchair Mobility    Modified Rankin (Stroke Patients Only)       Balance Overall balance assessment: Needs assistance Sitting-balance support: Feet supported Sitting balance-Leahy Scale: Good     Standing balance  support: During functional activity;No upper extremity supported Standing balance-Leahy Scale: Fair                              Cognition Arousal/Alertness: Awake/alert Behavior During Therapy: WFL for tasks assessed/performed Overall Cognitive Status: Within Functional Limits for tasks assessed                                        Exercises      General Comments        Pertinent Vitals/Pain Pain Assessment: Faces Faces Pain Scale: Hurts a little bit Pain Location: R hip  Pain Descriptors / Indicators: Aching;Sore Pain Intervention(s): Monitored during session;Repositioned;Premedicated before session    Home Living                      Prior Function            PT Goals (current goals can now be found in the care plan section) Acute Rehab PT Goals PT Goal Formulation: With patient/family Time For Goal Achievement: 07/23/16 Potential to Achieve Goals: Good Progress towards PT goals: Progressing toward goals    Frequency    Min 3X/week      PT Plan Current plan remains appropriate    Co-evaluation             End of Session   Activity Tolerance: Patient tolerated treatment well Patient left: in chair;with call bell/phone  within reach Nurse Communication: Mobility status PT Visit Diagnosis: Unsteadiness on feet (R26.81);Difficulty in walking, not elsewhere classified (R26.2);Muscle weakness (generalized) (M62.81);Pain Pain - Right/Left: Right Pain - part of body: Leg     Time: 4935-5217 PT Time Calculation (min) (ACUTE ONLY): 13 min  Charges:  $Gait Training: 8-22 mins                    G Codes:       [place], [place], [place] 07/17/2016, 4:26 PM

## 2016-07-17 NOTE — Progress Notes (Signed)
Patient slept mostly during the night. Pain medication given once for hip pain and effective.No complaints this morning. Patient stated that she feels much better and she does not need pain medication this morning for hip pain. Will continue to monitor.  Axil Copeman, RN

## 2016-07-18 DIAGNOSIS — R079 Chest pain, unspecified: Secondary | ICD-10-CM

## 2016-07-18 LAB — GLUCOSE, CAPILLARY
GLUCOSE-CAPILLARY: 148 mg/dL — AB (ref 65–99)
GLUCOSE-CAPILLARY: 163 mg/dL — AB (ref 65–99)
GLUCOSE-CAPILLARY: 129 mg/dL — AB (ref 65–99)
Glucose-Capillary: 113 mg/dL — ABNORMAL HIGH (ref 65–99)

## 2016-07-18 MED ORDER — SUCRALFATE 1 GM/10ML PO SUSP
1.0000 g | Freq: Three times a day (TID) | ORAL | Status: DC
  Administered 2016-07-18 – 2016-07-20 (×9): 1 g via ORAL
  Filled 2016-07-18 (×10): qty 10

## 2016-07-18 MED ORDER — MORPHINE SULFATE (PF) 2 MG/ML IV SOLN
2.0000 mg | Freq: Once | INTRAVENOUS | Status: AC | PRN
  Administered 2016-07-18: 2 mg via INTRAVENOUS
  Filled 2016-07-18: qty 1

## 2016-07-18 MED ORDER — GABAPENTIN 100 MG PO CAPS
100.0000 mg | ORAL_CAPSULE | Freq: Every day | ORAL | Status: DC
  Administered 2016-07-18 – 2016-07-19 (×2): 100 mg via ORAL
  Filled 2016-07-18 (×2): qty 1

## 2016-07-18 MED ORDER — ATORVASTATIN CALCIUM 10 MG PO TABS
10.0000 mg | ORAL_TABLET | Freq: Every day | ORAL | Status: DC
  Administered 2016-07-18 – 2016-07-20 (×3): 10 mg via ORAL
  Filled 2016-07-18 (×3): qty 1

## 2016-07-18 NOTE — Progress Notes (Signed)
Patient continues to c/o back pain, pain in the right hip but this is much improved since bowels have moved and she is receiving the Lidoderm patch and Voltaren gel.  Percocet administered at patients request.

## 2016-07-18 NOTE — Progress Notes (Signed)
PROGRESS NOTE    Sandra Cuevas  QPR:[id number] DOB: 03-13-1929 DOA: 07/14/2016 PCP: Purvis Kilts, MD  Outpatient Specialists:     Brief Narrative:  88 HTN Hypothyroid Reflux History of stress Myoview 06/2010-normal study and the last seen by Dr. Acie Fredrickson 9/93/57 Grade 2 diastolic is function using when necessary Lasix's last echo 02/18/13 EF 60-65% COPD although never smoker/chronic fixed asthma-last PFT 04/20/2013 showed no bronchodilator response Hoarseness of voice which is chronic-supposed to follow with ENT locally and has appointment  Recent admission 07/12/16 right thigh cellulitis left [place]  Readmitted the central chest pressure no radiation short of breath had CT of the chest 3/30, 4/8 showing T4 vertebral compression fracture and solitary right lower lobe nodule   Assessment & Plan:   Principal Problem:   Chest pain Active Problems:   Hypothyroidism   Chronic diastolic CHF (congestive heart failure) (HCC)   Cellulitis of right leg   Hypertensive urgency   Diabetes mellitus, type II ([place])   CKD (chronic kidney disease), stage II   AKI -increased after increase of ARB from 50 to 100 (had incidental finding of some renal a stenosis but not severe-- to see Bridgett Larsson as outpatient)-IVF and recheck BMP in AM-ARB stopped-patient was given 2 CTAs in last week-renal U/S 4/11 was negative -Creatinine at baseline is around 1 It is slightly increased to 1.4. We will continue fluids 75 cc per hour and recheck in a.m.  Chest pain -suspect GI related vs subacute compression fracture-CTA done of chest no aneurysm x 2-- 3/30 and 4/8-improve with Carafate/PPI BID  Hiatal hernia/GERD -has ENT appointment for hoarse voice (improving with GERD treatment) -+belching- DG esophagus 4/9 does not show stricture-increased to PPI BID  HTN urgency -? Missed doses of medication at home -increase norvasc to 10 mg, hold ARB due to increasing Cr- added hydralazine 25 every  8  Right thigh cellulitis (odd distribution for cellulitis) -much improved -treated with doxy till 4/10-no sign of zoster  Right hip pain with radicular pain from subacute t4 compression fracture X ray 4/10 no hip # ok-had been worked up in past-improved with Voltaren cream -adding Gabapentin 100 hs for pain 4/11 -lidocaine patch -Needs OP follow up PCP and could consider steroid burst as OP -therapy asked to re-vivist and provide exercise   solid 6 mm right lower lobe pulmonary nodule.   Non-contrast chest CT at 6-12 months is recommended. If the nodule is stable at time of repeat CT, then future CT at 18-24 months (from today's scan) is considered optional for low-risk patients, but is recommended for high-risk patients.  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 Bridgett Larsson   Therapy is seen and recommended home health  Consultants:     Procedures:     Antimicrobials:  doxy stop 4/110/18  Subjective:  Alert pleasant in no distress States has some Radicular symp Improved with percocet constipated and received 3 laxatives   Objective: Vitals:   07/17/16 1227 07/17/16 2006 07/17/16 2121 07/18/16 0428  BP: (!) 152/51 (!) 134/43  (!) 136/40  Pulse: (!) 54 74  62  Resp: 18   18  Temp: 98.3 F (36.8 C) 97.5 F (  36.4 C)  98 F (36.7 C)  TempSrc: Oral Oral  Oral  SpO2: 94% 97% 98% 97%  Weight:    93.8 kg (206 lb 14.4 oz)  Height:        Intake/Output Summary (Last 24 hours) at 07/18/16 0803 Last data filed at 07/18/16 7001  Gross per 24 hour  Intake          1683.75 ml  Output             2750 ml  Net         -1066.25 ml   [employer]    07/16/16 0447 07/17/16 0423 07/18/16 0428  Weight: 92.3 kg (203 lb 8 oz) 92.9 kg (204 lb 11.2 oz) 93.8 kg (206 lb 14.4 oz)    Examination:  General exam: Appears calm and comfortable  Respiratory system: Clear to auscultation. Respiratory effort normal. Cardiovascular system: S1 & S2 heard, RRR. No JVD Gastrointestinal system: Abdomen is nondistended, soft and nontender Central nervous system: Alert and oriented. No focal neurological deficits. Extremities: Symmetric 5 x 5 power. Skin: No rashes, lesions or ulcers Psychiatry: Judgement and insight appear normal. Mood & affect appropriate.     Data Reviewed: I have personally reviewed following labs and imaging studies  CBC:  Recent Labs Lab 07/12/16 0403 07/14/16 1143 07/14/16 1619 07/14/16 1723 07/16/16 0738 07/17/16 0306  WBC 7.7 6.9  --  6.8 7.8 6.6  NEUTROABS 5.2 4.4 3.7  --   --   --   HGB 10.2* 10.8*  --  10.7* 11.3* 9.9*  HCT 30.6* 32.7*  --  32.5* 33.8* 30.7*  MCV 92.4 91.1  --  90.8 90.9 91.9  PLT 198 225  --  211 217 749   Basic Metabolic Panel:  Recent Labs Lab 07/12/16 0403 07/14/16 1143 07/14/16 1723 07/16/16 0738 07/17/16 0306  NA 135 134*  --  134* 136  K 4.4 4.2  --  4.3 4.6  CL 102 96*  --  100* 100*  CO2 28 29  --  27 28  GLUCOSE 127* 153*  --  149* 112*  BUN 20 12  --  12 17  CREATININE 1.11* 1.06* 1.07* 1.23* 1.41*  CALCIUM 8.8* 9.0  --  9.0 8.7*   GFR: Estimated Creatinine Clearance: 33.1 mL/min (A) (by C-G formula based on SCr of 1.41 mg/dL (H)). Liver Function Tests:  Recent Labs Lab 07/12/16 0403 07/14/16 1132  AST 17 21  ALT 16 15  ALKPHOS 74 68  BILITOT 0.3 0.4  PROT 6.2* 6.7  ALBUMIN 3.1* 2.8*   No results for input(s): LIPASE, AMYLASE in the last 168 hours. No results for input(s): AMMONIA in the last 168 hours. Coagulation Profile: No results for input(s): INR, PROTIME in the last 168 hours. Cardiac Enzymes:  Recent Labs Lab 07/12/16 0803 07/14/16 1723    TROPONINI <0.03 <0.03   BNP (last 3 results) No results for input(s): PROBNP in the last 8760 hours. HbA1C: No results for input(s): HGBA1C in the last 72 hours. CBG:  Recent Labs Lab 07/16/16 2155 07/17/16 0751 07/17/16 1131 07/17/16 1637 07/17/16 2050  GLUCAP 156* 139* 119* 134* 111*   Lipid Profile: No results for input(s): CHOL, HDL, LDLCALC, TRIG, CHOLHDL, LDLDIRECT in the last 72 hours. Thyroid Function Tests: No results for input(s): TSH, T4TOTAL, FREET4, T3FREE, THYROIDAB in the last 72 hours. Anemia Panel: No results for input(s): VITAMINB12, FOLATE, FERRITIN, TIBC, IRON, RETICCTPCT in the last 72 hours. Urine analysis:  Component Value Date/Time   COLORURINE YELLOW 07/16/2016 1742   APPEARANCEUR CLEAR 07/16/2016 1742   LABSPEC 1.015 07/16/2016 1742   PHURINE 6.0 07/16/2016 1742   GLUCOSEU NEGATIVE 07/16/2016 1742   HGBUR NEGATIVE 07/16/2016 1742   BILIRUBINUR NEGATIVE 07/16/2016 1742   KETONESUR NEGATIVE 07/16/2016 1742   PROTEINUR NEGATIVE 07/16/2016 1742   UROBILINOGEN 0.2 12/28/2009 0622   NITRITE NEGATIVE 07/16/2016 1742   LEUKOCYTESUR NEGATIVE 07/16/2016 1742   Sepsis Labs: @LABRCNTIP (procalcitonin:4,lacticidven:4)  )No results found for this or any previous visit (from the past 240 hour(s)).       Radiology Studies: US Renal  Result Date: 07/17/2016 CLINICAL DATA:  Acute kidney injury EXAM: RENAL / URINARY TRACT ULTRASOUND COMPLETE COMPARISON:  None. FINDINGS: Right Kidney: Length: 9.6 cm. Echogenicity within normal limits. No mass or hydronephrosis visualized. Left Kidney: Length: 10.3 cm. Echogenicity within normal limits. No mass or hydronephrosis visualized. Bladder: Appears normal for degree of bladder distention. IMPRESSION: Normal renal ultrasound. Electronically Signed   By: Ulyses Jarred M.D.   On: 07/17/2016 23:07   Dg Hip Unilat With Pelvis 2-3 Views Right  Result Date: 07/16/2016 CLINICAL DATA:  Right hip pain without known  injury. EXAM: DG HIP (WITH OR WITHOUT PELVIS) 2-3V RIGHT COMPARISON:  None. FINDINGS: There is no evidence of hip fracture or dislocation. There is no evidence of arthropathy or other focal bone abnormality. IMPRESSION: Normal right hip. Electronically Signed   By: Marijo Conception, M.D.   On: 07/16/2016 10:55        Scheduled Meds: . amLODipine  10 mg Oral Daily  . atorvastatin  10 mg Oral Q breakfast  . diclofenac sodium  2 g Topical QID  . docusate sodium  200 mg Oral QHS  . gabapentin  100 mg Oral QHS  . heparin  5,000 Units Subcutaneous Q8H  . hydrALAZINE  25 mg Oral Q8H  . insulin aspart  0-9 Units Subcutaneous TID WC  . levothyroxine  50 mcg Oral QAC breakfast  . lidocaine  1 patch Transdermal Q24H  . mometasone-formoterol  2 puff Inhalation BID  . montelukast  10 mg Oral Daily  . multivitamin  1 tablet Oral BID  . pantoprazole  40 mg Oral BID  . polycarbophil  1,250 mg Oral Daily  . polyethylene glycol  17 g Oral Daily  . potassium chloride  10 mEq Oral Daily  . senna-docusate  2 tablet Oral BID  . sucralfate  1 g Oral TID WC & HS   Continuous Infusions: . sodium chloride 75 mL/hr at 07/17/16 1200     LOS: 3 days    Time spent: [place], MD [facility] ([facility]   If 7PM-7AM, please contact night-coverage [email or web address] Password [facility] 07/18/2016, 8:03 AM

## 2016-07-18 NOTE — Progress Notes (Signed)
Physical Therapy Treatment Patient Details Name: Sandra Cuevas MRN: [id number] DOB: [birth year] Today's Date: 07/18/2016    History of Present Illness Patient is an 83 yo female admitted 07/14/16 with chest pain going into back between scapula.  Patient also with Rt thigh cellulitis with pain moving into hip.  Patient with hypertensive urgency.   PMH:  CHF, cellulitis Rt thigh, HLD, CKD, DM, T4 compression fx (subacute)    PT Comments    Pt is progressing well with mobility, she ambulated 240' with RW without loss of balance. She reports pain has diminished somewhat since she had a bowel movement today (she'd been constipated for 5-6 days). Instructed pt in RLE strengthening exercises.   Follow Up Recommendations  Supervision - Intermittent;Home health PT;Other (comment) (pt declining HHPT)     Equipment Recommendations  None recommended by PT    Recommendations for Other Services       Precautions / Restrictions Precautions Precautions: Fall Restrictions Weight Bearing Restrictions: No    Mobility  Bed Mobility Overal bed mobility: Modified Independent             General bed mobility comments: supine to sit using bedrail  Transfers Overall transfer level: Modified independent Equipment used: Rolling walker (2 wheeled)             General transfer comment: Increased time.  Ambulation/Gait Ambulation/Gait assistance: Supervision Ambulation Distance (Feet): 240 Feet Assistive device: Rolling walker (2 wheeled) Gait Pattern/deviations: Step-through pattern;Decreased stride length;Decreased weight shift to right;Antalgic Gait velocity: decreased   General Gait Details: Patient with safe use of RW. Slow, steady gait with RW.  Mildly Antalgic on RLE.  No loss of balance.   Stairs            Wheelchair Mobility    Modified Rankin (Stroke Patients Only)       Balance Overall balance assessment: Needs assistance Sitting-balance support: Feet  supported Sitting balance-Leahy Scale: Good     Standing balance support: During functional activity;No upper extremity supported Standing balance-Leahy Scale: Fair                              Cognition Arousal/Alertness: Awake/alert Behavior During Therapy: WFL for tasks assessed/performed Overall Cognitive Status: Within Functional Limits for tasks assessed                                        Exercises General Exercises - Lower Extremity Ankle Circles/Pumps: AROM;Right;10 reps;Seated Long Arc Quad: AROM;Right;10 reps;Seated Hip Flexion/Marching: AROM;Right;10 reps;Seated    General Comments        Pertinent Vitals/Pain Pain Score: 5  Pain Location: back Pain Descriptors / Indicators: Aching Pain Intervention(s): Limited activity within patient's tolerance;Monitored during session    Home Living                      Prior Function            PT Goals (current goals can now be found in the care plan section) Acute Rehab PT Goals Patient Stated Goal: To decrease pain  PT Goal Formulation: With patient Time For Goal Achievement: 07/23/16 Potential to Achieve Goals: Good Progress towards PT goals: Progressing toward goals    Frequency    Min 3X/week      PT Plan Current plan remains appropriate    Co-evaluation  End of Session Equipment Utilized During Treatment: Oxygen Activity Tolerance: Patient tolerated treatment well Patient left: with call bell/phone within reach;in bed Nurse Communication: Mobility status PT Visit Diagnosis: Unsteadiness on feet (R26.81);Difficulty in walking, not elsewhere classified (R26.2);Muscle weakness (generalized) (M62.81);Pain Pain - part of body:  (back)     Time: 1430-1444 PT Time Calculation (min) (ACUTE ONLY): 14 min  Charges:  $Gait Training: 8-22 mins                    G Codes:          Philomena Doheny 07/18/2016, 2:50  PM [phone]

## 2016-07-18 NOTE — Care Management Note (Signed)
Case Management Note  Patient Details  Name: Sandra Cuevas MRN: [id number] Date of Birth: 1929/01/15  Subjective/Objective:      Admitted with Chest Pain             Action/Plan: Patient lives at home alone, her daughter lives close by; PCP: Purvis Kilts, MD; has private insurance with Medicare / BCBS with prescription drug coverage; pharmacy of choice is [place]; DME - cane and walker at home; patient could benefit from [facility] services; [facility] options given, pt/ daughter chose [facility]; Mary with [facility] called for arrangements.  Expected Discharge Date:  07/16/16               Expected Discharge Plan:  [place]  In-House Referral:   [facility]  Discharge planning Services  CM Consult  Choice offered to:  Patient, Adult Children    HH Arranged:  RN, Disease Management, PT [place] Agency:  [facility]  Status of Service:  In process, will continue to follow  Sherrilyn Rist [phone] 07/18/2016, 11:52 AM

## 2016-07-18 NOTE — Consult Note (Signed)
   [facility] CM Inpatient Consult   07/18/2016  Sandra Cuevas Apr 20, 1928 [id number]   Chart reviewed for progress and disposition needs for a recent re-admission and ED visits in the past 6 months. Patient is in the Medicare ACO.  Chart review reveals the Patient is an 83 yo female admitted 07/14/16 with chest pain going into back between scapula.  Patient also with Rt thigh cellulitis with pain moving into hip.  Patient with hypertensive urgency.   PMH:  CHF, cellulitis Rt thigh, HLD, CKD, DM, T4 compression fx (subacute) per Physical Therapy notes.  Noted that the patient has been recommended for HHPT but declined.  Met with patient and Sandra Cuevas sister in Sports coach, verified this Probation officer could speak in front of her  regarding post hospital follow up and management.  Patient state she lives alone but her son and daughter in law lives near by and feels that she can manage.  Explained the post hospital EMMI follow up calls and how [place] Management may be involved if indicated by the automated calls.  She states she doesn't feel she will need home visits and the calls will be alright for now.  Encouraged her to call at anytime if she changes her mind.  She endorses Sandra Cuevas as her primary care provider. A brochure and contact information given with the 24 hour nurse advise line.  For questions, please contact:  Natividad Brood, RN BSN [facility] Liaison  [phone] business mobile phone Toll free office [phone]

## 2016-07-19 ENCOUNTER — Inpatient Hospital Stay (HOSPITAL_COMMUNITY): Payer: Medicare Other

## 2016-07-19 LAB — BASIC METABOLIC PANEL
ANION GAP: 7 (ref 5–15)
BUN: 18 mg/dL (ref 6–20)
CO2: 27 mmol/L (ref 22–32)
Calcium: 8.4 mg/dL — ABNORMAL LOW (ref 8.9–10.3)
Chloride: 104 mmol/L (ref 101–111)
Creatinine, Ser: 1.15 mg/dL — ABNORMAL HIGH (ref 0.44–1.00)
GFR calc Af Amer: 48 mL/min — ABNORMAL LOW (ref >60)
GFR, EST NON AFRICAN AMERICAN: 42 mL/min — AB (ref >60)
GLUCOSE: 106 mg/dL — AB (ref 65–99)
POTASSIUM: 4.8 mmol/L (ref 3.5–5.1)
Sodium: 138 mmol/L (ref 135–145)

## 2016-07-19 LAB — CBC
HEMATOCRIT: 29.6 % — AB (ref 36.0–46.0)
HEMOGLOBIN: 9.6 g/dL — AB (ref 12.0–15.0)
MCH: 30.3 pg (ref 26.0–34.0)
MCHC: 32.4 g/dL (ref 30.0–36.0)
MCV: 93.4 fL (ref 78.0–100.0)
Platelets: 196 10*3/uL (ref 150–400)
RBC: 3.17 MIL/uL — AB (ref 3.87–5.11)
RDW: 14.3 % (ref 11.5–15.5)
WBC: 7 10*3/uL (ref 4.0–10.5)

## 2016-07-19 LAB — GLUCOSE, CAPILLARY
GLUCOSE-CAPILLARY: 179 mg/dL — AB (ref 65–99)
GLUCOSE-CAPILLARY: 114 mg/dL — AB (ref 65–99)
Glucose-Capillary: 101 mg/dL — ABNORMAL HIGH (ref 65–99)
Glucose-Capillary: 107 mg/dL — ABNORMAL HIGH (ref 65–99)

## 2016-07-19 MED ORDER — MORPHINE SULFATE (PF) 2 MG/ML IV SOLN
2.0000 mg | Freq: Four times a day (QID) | INTRAVENOUS | Status: AC | PRN
  Administered 2016-07-20: 2 mg via INTRAVENOUS
  Filled 2016-07-19 (×2): qty 1

## 2016-07-19 MED ORDER — MORPHINE SULFATE (PF) 2 MG/ML IV SOLN
2.0000 mg | Freq: Four times a day (QID) | INTRAVENOUS | Status: AC | PRN
  Administered 2016-07-19: 2 mg via INTRAVENOUS
  Filled 2016-07-19: qty 1

## 2016-07-19 MED ORDER — MORPHINE SULFATE (PF) 2 MG/ML IV SOLN
2.0000 mg | Freq: Once | INTRAVENOUS | Status: AC | PRN
  Administered 2016-07-19: 2 mg via INTRAVENOUS
  Filled 2016-07-19: qty 1

## 2016-07-19 NOTE — Care Management Important Message (Signed)
Important Message  Patient Details  Name: Sandra Cuevas MRN: [id number] Date of Birth: 03-07-1929   Medicare Important Message Given:  Yes    Edric Fetterman 07/19/2016, 2:32 PM

## 2016-07-19 NOTE — Progress Notes (Signed)
PROGRESS NOTE    Sandra Cuevas  YNW:[id number] DOB: 18-Sep-1928 DOA: 07/14/2016 PCP: Purvis Kilts, MD  Outpatient Specialists:     Brief Narrative:  88 HTN Hypothyroid Reflux History of stress Myoview 06/2010-normal study and the last seen by Dr. Acie Fredrickson 6/57/84 Grade 2 diastolic is function using when necessary Lasix's last echo 02/18/13 EF 60-65% COPD although never smoker/chronic fixed asthma-last PFT 04/20/2013 showed no bronchodilator response Hoarseness of voice which is chronic-supposed to follow with ENT locally and has appointment  Recent admission 07/12/16 right thigh cellulitis left [place]  Readmitted the central chest pressure no radiation short of breath had CT of the chest 3/30, 4/8 showing T4 vertebral compression fracture and solitary right lower lobe nodule   Assessment & Plan:   Principal Problem:   Chest pain Active Problems:   Hypothyroidism   Chronic diastolic CHF (congestive heart failure) (HCC)   Cellulitis of right leg   Hypertensive urgency   Diabetes mellitus, type II ([place])   CKD (chronic kidney disease), stage II   AKI -increased after increase of ARB from 50 to 100 (had incidental finding of some renal a stenosis but not severe-- to see Bridgett Larsson as outpatient)-IVF and recheck BMP in AM-ARB stopped-patient was given 2 CTAs in last week-renal U/S 4/11 was negative -Creatinine at baseline is around 1 It is slightly increased to 1.4.  -holding IV saline today-labs am force oral fluids upto 1500 cc  Chest pain -suspect GI related vs subacute compression fracture-CTA done of chest no aneurysm x 2-- 3/30 and 4/8-improve with Carafate/PPI BID  Hiatal hernia/GERD -has ENT appointment for hoarse voice (improving with GERD treatment) -+belching- DG esophagus 4/9 does not show stricture-increased to PPI BID  HTN urgency -pain contributing to htn -increase norvasc to 10 mg, hold ARB due to increasing Cr- added hydralazine 25 every  8  Right thigh cellulitis (odd distribution for cellulitis) -much improved -treated with doxy till 4/10-no sign of zoster  Right hip pain with radicular pain from subacute t4 compression fracture X ray 4/10 no hip # ok-had been worked up in past-improved with Voltaren cream -adding Gabapentin 100 hs for pain 4/11 -lidocaine patch -Needs OP follow up PCP and could consider steroid burst as OP -adding Lumbar MRI as well to determine if any other Low back pathology--Seems to have l4-l5 distribution pain as well -prn morphine 2 mg q 6 prn added to percocet -if not candidate for procedure, get Thoraco-lumbar bracing  solid 6 mm right lower lobe pulmonary nodule.   Non-contrast chest CT at 6-12 months is recommended. If the nodule is stable at time of repeat CT, then future CT at 18-24 months (from today's scan) is considered optional for low-risk patients, but is recommended for high-risk patients.  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 Bridgett Larsson   Therapy is seen and recommended home health  Consultants:   IR telephone consulted Dr. Pascal Lux  Procedures:     Antimicrobials:  doxy stop 4/110/18  Subjective:  Pain severe overnight-teraful now 6/10 Also some shoulder and radiating R shoulder pain to back No n/v eating   Objective: Vitals:   07/18/16 1929 07/19/16 0508 07/19/16 0700 07/19/16  0945  BP: (!) 119/48 (!) 215/70 (!) 133/37 (!) 153/44  Pulse: 70 82 68 96  Resp: 18 18  (!) 21  Temp: 98.1 F (36.7 C) 98 F (36.7 C)  97.7 F (36.5 C)  TempSrc: Oral Oral  Oral  SpO2: 95% 95%  98%  Weight:  94.8 kg (209 lb)    Height:        Intake/Output Summary  (Last 24 hours) at 07/19/16 1043 Last data filed at 07/19/16 0700  Gross per 24 hour  Intake           3207.5 ml  Output             2700 ml  Net            507.5 ml   Filed Weights   07/17/16 0423 07/18/16 0428 07/19/16 0508  Weight: 92.9 kg (204 lb 11.2 oz) 93.8 kg (206 lb 14.4 oz) 94.8 kg (209 lb)    Examination:  General exam: Appears calm and comfortable  Respiratory system: Clear to auscultation. Respiratory effort normal. Cardiovascular system: S1 & S2 heard, RRR. No JVD Gastrointestinal system: Abdomen is nondistended, soft and nontender Central nervous system: Alert and oriented. No focal neurological deficits. Extremities: Symmetric 5 x 5 power.  Had pain/numbness down R leg into thigh halfway and pain with rad from back to groin--also upper abd pain    Data Reviewed: I have personally reviewed following labs and imaging studies  CBC:  Recent Labs Lab 07/14/16 1143 07/14/16 1619 07/14/16 1723 07/16/16 0738 07/17/16 0306 07/19/16 0318  WBC 6.9  --  6.8 7.8 6.6 7.0  NEUTROABS 4.4 3.7  --   --   --   --   HGB 10.8*  --  10.7* 11.3* 9.9* 9.6*  HCT 32.7*  --  32.5* 33.8* 30.7* 29.6*  MCV 91.1  --  90.8 90.9 91.9 93.4  PLT 225  --  211 217 213 284   Basic Metabolic Panel:  Recent Labs Lab 07/14/16 1143 07/14/16 1723 07/16/16 0738 07/17/16 0306 07/19/16 0318  NA 134*  --  134* 136 138  K 4.2  --  4.3 4.6 4.8  CL 96*  --  100* 100* 104  CO2 29  --  27 28 27   GLUCOSE 153*  --  149* 112* 106*  BUN 12  --  12 17 18   CREATININE 1.06* 1.07* 1.23* 1.41* 1.15*  CALCIUM 9.0  --  9.0 8.7* 8.4*   GFR: Estimated Creatinine Clearance: 40.7 mL/min (A) (by C-G formula based on SCr of 1.15 mg/dL (H)). Liver Function Tests:  Recent Labs Lab 07/14/16 1132  AST 21  ALT 15  ALKPHOS 68  BILITOT 0.4  PROT 6.7  ALBUMIN 2.8*   No results for input(s): LIPASE, AMYLASE in the last 168 hours. No results for input(s): AMMONIA in the last 168 hours. Coagulation  Profile: No results for input(s): INR, PROTIME in the last 168 hours. Cardiac Enzymes:  Recent Labs Lab 07/14/16 1723  TROPONINI <0.03   BNP (last 3 results) No results for input(s): PROBNP in the last 8760 hours. HbA1C: No results for input(s): HGBA1C in the last 72 hours. CBG:  Recent Labs Lab 07/18/16 0807 07/18/16 1158 07/18/16 1626 07/18/16 2111 07/19/16 0752  GLUCAP 113* 148* 163* 129* 101*   Lipid Profile: No results for input(s): CHOL, HDL, LDLCALC, TRIG, CHOLHDL, LDLDIRECT in the last 72 hours. Thyroid Function Tests: No results for input(s): TSH, T4TOTAL, FREET4, T3FREE, THYROIDAB in the  last 72 hours. Anemia Panel: No results for input(s): VITAMINB12, FOLATE, FERRITIN, TIBC, IRON, RETICCTPCT in the last 72 hours. Urine analysis:    Component Value Date/Time   COLORURINE YELLOW 07/16/2016 1742   APPEARANCEUR CLEAR 07/16/2016 1742   LABSPEC 1.015 07/16/2016 1742   PHURINE 6.0 07/16/2016 1742   GLUCOSEU NEGATIVE 07/16/2016 1742   HGBUR NEGATIVE 07/16/2016 1742   BILIRUBINUR NEGATIVE 07/16/2016 1742   KETONESUR NEGATIVE 07/16/2016 1742   PROTEINUR NEGATIVE 07/16/2016 1742   UROBILINOGEN 0.2 12/28/2009 0622   NITRITE NEGATIVE 07/16/2016 1742   LEUKOCYTESUR NEGATIVE 07/16/2016 1742    )No results found for this or any previous visit (from the past 240 hour(s)).    Radiology Studies: US Renal  Result Date: 07/17/2016 CLINICAL DATA:  Acute kidney injury EXAM: RENAL / URINARY TRACT ULTRASOUND COMPLETE COMPARISON:  None. FINDINGS: Right Kidney: Length: 9.6 cm. Echogenicity within normal limits. No mass or hydronephrosis visualized. Left Kidney: Length: 10.3 cm. Echogenicity within normal limits. No mass or hydronephrosis visualized. Bladder: Appears normal for degree of bladder distention. IMPRESSION: Normal renal ultrasound. Electronically Signed   By: Ulyses Jarred M.D.   On: 07/17/2016 23:07    Scheduled Meds: . amLODipine  10 mg Oral Daily  .  atorvastatin  10 mg Oral Q breakfast  . diclofenac sodium  2 g Topical QID  . docusate sodium  200 mg Oral QHS  . gabapentin  100 mg Oral QHS  . heparin  5,000 Units Subcutaneous Q8H  . hydrALAZINE  25 mg Oral Q8H  . insulin aspart  0-9 Units Subcutaneous TID WC  . levothyroxine  50 mcg Oral QAC breakfast  . lidocaine  1 patch Transdermal Q24H  . mometasone-formoterol  2 puff Inhalation BID  . montelukast  10 mg Oral Daily  . multivitamin  1 tablet Oral BID  . pantoprazole  40 mg Oral BID  . polycarbophil  1,250 mg Oral Daily  . polyethylene glycol  17 g Oral Daily  . potassium chloride  10 mEq Oral Daily  . senna-docusate  2 tablet Oral BID  . sucralfate  1 g Oral TID WC & HS   Continuous Infusions: . sodium chloride 75 mL/hr at 07/18/16 1453     LOS: 4 days    Time spent: [place], MD [facility] ([facility]   If 7PM-7AM, please contact night-coverage [email or web address] Password [facility] 07/19/2016, 10:43 AM

## 2016-07-19 NOTE — Progress Notes (Signed)
Pt continues with severe back/hip pain on 2 occassions during this shift that radiates to chest and all over her body. MD paged. Will continue to monitor.

## 2016-07-20 LAB — GLUCOSE, CAPILLARY
Glucose-Capillary: 100 mg/dL — ABNORMAL HIGH (ref 65–99)
Glucose-Capillary: 113 mg/dL — ABNORMAL HIGH (ref 65–99)

## 2016-07-20 LAB — RENAL FUNCTION PANEL
ALBUMIN: 2.8 g/dL — AB (ref 3.5–5.0)
Anion gap: 6 (ref 5–15)
BUN: 16 mg/dL (ref 6–20)
CALCIUM: 8.8 mg/dL — AB (ref 8.9–10.3)
CO2: 28 mmol/L (ref 22–32)
CREATININE: 1.14 mg/dL — AB (ref 0.44–1.00)
Chloride: 103 mmol/L (ref 101–111)
GFR calc non Af Amer: 42 mL/min — ABNORMAL LOW (ref >60)
GFR, EST AFRICAN AMERICAN: 49 mL/min — AB (ref >60)
GLUCOSE: 112 mg/dL — AB (ref 65–99)
Phosphorus: 3.4 mg/dL (ref 2.5–4.6)
Potassium: 4.6 mmol/L (ref 3.5–5.1)
SODIUM: 137 mmol/L (ref 135–145)

## 2016-07-20 MED ORDER — METFORMIN HCL 500 MG PO TABS: 500.0000 mg | ORAL_TABLET | Freq: Two times a day (BID) | ORAL | Status: DC

## 2016-07-20 MED ORDER — AMLODIPINE BESYLATE 10 MG PO TABS: 10.0000 mg | ORAL_TABLET | Freq: Every day | ORAL | 0 refills | Status: DC

## 2016-07-20 MED ORDER — HYDRALAZINE HCL 25 MG PO TABS: 25.0000 mg | ORAL_TABLET | Freq: Three times a day (TID) | ORAL | 0 refills | Status: DC

## 2016-07-20 MED ORDER — GABAPENTIN 100 MG PO CAPS: 100.0000 mg | ORAL_CAPSULE | Freq: Every day | ORAL | 0 refills | Status: DC

## 2016-07-20 MED ORDER — LIDOCAINE 5 % EX PTCH: 1.0000 | MEDICATED_PATCH | CUTANEOUS | 0 refills | Status: DC

## 2016-07-20 MED ORDER — OXYCODONE-ACETAMINOPHEN 5-325 MG PO TABS: 1.0000 | ORAL_TABLET | ORAL | 0 refills | Status: DC | PRN

## 2016-07-20 MED ORDER — DICLOFENAC SODIUM 1 % TD GEL: 2.0000 g | Freq: Four times a day (QID) | TRANSDERMAL | 0 refills | Status: DC

## 2016-07-20 MED ORDER — POLYETHYLENE GLYCOL 3350 17 G PO PACK: 17.0000 g | PACK | Freq: Every day | ORAL | 0 refills | Status: DC

## 2016-07-20 NOTE — Plan of Care (Signed)
Problem: Pain Managment: Goal: General experience of comfort will improve Outcome: Adequate for Discharge Patients pain is much improved, will follow-up with primary care and orthopedist after discharge for chronic pain issues

## 2016-07-20 NOTE — Discharge Summary (Addendum)
Physician Discharge Summary  Sandra Cuevas WPV:[id number] DOB: 29-Jul-1928 DOA: 07/14/2016  PCP: Purvis Kilts, MD  Admit date: 07/14/2016 Discharge date: 07/20/2016  Time spent: 40 minutes  Recommendations for Outpatient Follow-up:  1. Needs OP pain manege ment consideration injection ESI Dr. Concepcion Elk up prior to admit 2. Needs screening CT scan 12-18 mo has a nodule 3. Limited rx gabapentin and percocet given to patient 4. Needs labs in 1 mo 5. New meds of amlodipine and hydralazine this admit  Discharge Diagnoses:  Principal Problem:   Chest pain Active Problems:   Hypothyroidism   Chronic diastolic CHF (congestive heart failure) (HCC)   Cellulitis of right leg   Hypertensive urgency   Diabetes mellitus, type II (HCC)   CKD (chronic kidney disease), stage II   Discharge Condition: improved  Diet recommendation:  hh low salt  Filed Weights   07/18/16 0428 07/19/16 0508 07/20/16 0521  Weight: 93.8 kg (206 lb 14.4 oz) 94.8 kg (209 lb) 94.4 kg (208 lb 1.6 oz)    History of present illness:  45 HTN Hypothyroid Reflux History of stress Myoview 06/2010-normal study and the last seen by Dr. Acie Fredrickson 7/48/27 Grade 2 diastolic is function using when necessary Lasix's last echo 02/18/13 EF 60-65% COPD although never smoker/chronic fixed asthma-last PFT 04/20/2013 showed no bronchodilator response Hoarseness of voice which is chronic-supposed to follow with ENT locally and has appointment  Recent admission 07/12/16 right thigh cellulitis left AGAINST MEDICAL ADVICE  Readmitted the central chest pressure no radiation short of breath had CT of the chest 3/30, 4/8 showing T4 vertebral compression fracture and solitary right lower lobe nodule   Hospital Course:   AKI -increased after increase of ARB from 50 to 100 (had incidental finding of some renal a stenosis but not severe-- to see Bridgett Larsson as outpatient)-IVF and recheck BMP in AM-ARB stopped-patient was given 2  CTAs in last week-renal U/S 4/11 was negative -Creatinine at baseline is around 1 It is slightly increased to 1.4---->with IVF and holding some meds creat back to wnl  Chest pain -suspect GI related vs subacute compression fracture-CTA done of chest no aneurysm x 2-- 3/30 and 4/8-improve with Carafate/PPI BI  Hiatal hernia/GERD -has ENT appointment for hoarse voice (improving with GERD treatment) -+belching- DG esophagus 4/9 does not show stricture-increased to PPI BID  HTN urgency -pain contributing to htn -increase norvasc to 10 mg, hold ARB due to increasing Cr- added hydralazine 25 every 8  Right thigh cellulitis (odd distribution for cellulitis) -much improved -treated with doxy till 4/10-no sign of zoster  Right hip pain with radicular pain from subacute t4 compression fracture X ray 4/10 no hip # ok-had been worked up in past-improved with Voltaren cream -adding Gabapentin 100 hs for pain 4/11 -lidocaine patch -Needs OP follow up PCP and could consider steroid burst as OP -adding Lumbar MRI as well to determine if any other Low back pathology--Seems to have l4-l5 distribution pain as well--MRI's showed all chronic changes as well as Lumbar DDD -prn morphine 2 mg q 6 prn added to percocet -not candidate t Thoraco-lumbar bracing-painful--already is set up with Raliegh Ip as OP for ESI once cellulitis clears  solid 6 mm right lower lobe pulmonary nodule.  Non-contrast chest CT at 6-12 months is recommended. If the nodule is stable at time of repeat CT, then future CT at 18-24 months (from today's scan) is considered optional for low-risk patients, but is recommended for high-risk patients.  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 Bridgett Larsson   Discharge Exam: Vitals:   07/20/16 0521 07/20/16 0900  BP: (!) 176/59 (!) 164/42  Pulse: 80 (!) 58  Resp: 20   Temp: 98 F (36.7 C)     General: alert pleasant  Cardiovascular: s1 s 2no m/r/g Respiratory: clear no added sound Able to stand-bend forward and backward reasonable--similar radicular pains in R hip but seems fair overall No abd pains  Discharge Instructions    Current Discharge Medication List    START taking these medications   Details  amLODipine (NORVASC) 10 MG tablet Take 1 tablet (10 mg total) by mouth daily. Qty: 30 tablet, Refills: 0    diclofenac sodium (VOLTAREN) 1 % GEL Apply 2 g topically 4 (four) times daily. Qty: 100 g, Refills: 0    gabapentin (NEURONTIN) 100 MG capsule Take 1 capsule (100 mg total) by mouth at bedtime. Qty: 39 capsule, Refills: 0    hydrALAZINE (APRESOLINE) 25 MG tablet Take 1 tablet (25 mg total) by mouth every 8 (eight) hours. Qty: 90 tablet, Refills: 0    lidocaine (LIDODERM) 5 % Place 1 patch onto the skin daily. Remove & Discard patch within 12 hours or as directed by MD Qty: 30 patch, Refills: 0    metFORMIN (GLUCOPHAGE) 500 MG tablet Take 1 tablet (500 mg total) by mouth 2 (two) times daily with a meal.    oxyCODONE-acetaminophen (PERCOCET/ROXICET) 5-325 MG tablet Take 1 tablet by mouth every 4 (four) hours as needed for moderate pain or severe pain. Qty: 30 tablet, Refills: 0    polyethylene glycol (MIRALAX / GLYCOLAX) packet Take 17 g by mouth daily. Qty: 14 each, Refills: 0      CONTINUE these medications which have NOT CHANGED   Details  acetaminophen (TYLENOL) 500 MG tablet Take 2 tablets (1,000 mg total) by mouth every 6 (six) hours as needed. Qty: 30 tablet, Refills: 0    albuterol (PROVENTIL HFA;VENTOLIN HFA) 108 (90 BASE) MCG/ACT inhaler Inhale 2 puffs into the lungs 2 (two) times daily.     atorvastatin (LIPITOR) 10 MG tablet Take 10 mg by mouth Daily.     budesonide-formoterol (SYMBICORT) 160-4.5 MCG/ACT inhaler Inhale 2 puffs into the lungs 2 (two) times daily. Qty: 1 Inhaler, Refills: 12    docusate sodium (COLACE) 100 MG capsule Take 200 mg by mouth at bedtime.    levothyroxine (SYNTHROID, LEVOTHROID) 50 MCG tablet Take 50 mcg by mouth daily.      montelukast (SINGULAIR) 10 MG tablet Take 10 mg by mouth daily.     omeprazole (PRILOSEC) 20 MG capsule Take 20 mg by mouth 2 (two) times daily before a meal.    Polyvinyl Alcohol-Povidone (MURINE TEARS FOR DRY EYES) 5-6 MG/ML SOLN Place 1 drop into both eyes daily as needed (Dry Eyes).    potassium chloride (MICRO-K) 10 MEQ CR capsule Take 10 mEq by mouth daily.     albuterol (PROVENTIL) (5 MG/ML) 0.5% nebulizer solution Take 0.5 mLs (2.5 mg total) by nebulization every 4 (four) hours as needed for wheezing or shortness of breath. Qty: 20 mL, Refills: 12      STOP taking these medications  losartan (COZAAR) 50 MG tablet      Multiple Vitamins-Minerals (ICAPS AREDS 2 PO)      polycarbophil (FIBERCON) 625 MG tablet        No Known Allergies Follow-up Information    [facility] Follow up.   Specialty:  [place] Why:  They will do your home health care at your home Contact information: [address] [place]  [phone]            The results of significant diagnostics from this hospitalization (including imaging, microbiology, ancillary and laboratory) are listed below for reference.    Significant Diagnostic Studies: Dg Chest 2 View  Result Date: 07/14/2016 CLINICAL DATA:  Shortness of breath.  Cellulitis in legs. EXAM: CHEST  2 VIEW COMPARISON:  July 12, 2016 FINDINGS: Mild cardiomegaly. Mild atelectasis in the bases. No pneumothorax. No pulmonary nodules, masses, or focal infiltrates. Tiny pleural effusions. No overt edema. IMPRESSION: Cardiomegaly.  Tiny  pleural effusions with no overt edema. Electronically Signed   By: Dorise Bullion III M.D   On: 07/14/2016 12:33   Dg Chest 2 View  Result Date: 07/05/2016 CLINICAL DATA:  Chest pain. EXAM: CHEST  2 VIEW COMPARISON:  07/04/2016.  10/09/2013. FINDINGS: Mediastinum and hilar structures are normal. Lungs are clear. Cardiomegaly with normal pulmonary vascularity. No pleural effusion or pneumothorax. No acute bony abnormality. Right shoulder replacement. IMPRESSION: 1.  Cardiomegaly.  No pulmonary venous congestion. 2. No acute pulmonary disease. Electronically Signed   By: Marcello Moores  Register   On: 07/05/2016 11:16   Dg Chest 2 View  Result Date: 07/04/2016 CLINICAL DATA:  Chest pain EXAM: CHEST  2 VIEW COMPARISON:  October 09, 2013 FINDINGS: There is no edema or consolidation. Heart is upper normal in size with pulmonary vascularity within normal limits. There is atherosclerotic calcification in the aorta. No adenopathy. There is a total shoulder replacement on the right. IMPRESSION: Aortic atherosclerosis.  No edema or consolidation. Electronically Signed   By: Lowella Grip III M.D.   On: 07/04/2016 09:46   Ct Thoracic Spine Wo Contrast  Result Date: 07/12/2016 CLINICAL DATA:  Severe mid thoracic and chest pain for 1 week. No reported acute injury. History of diabetes, gastroesophageal reflux and uterine cancer. EXAM: CT THORACIC SPINE WITHOUT CONTRAST TECHNIQUE: Multidetector CT images of the thoracic were obtained using the standard protocol without intravenous contrast. COMPARISON:  Chest radiographs 07/05/2016 and 10/09/2013. Chest CT 02/17/2013 and 07/05/2016. FINDINGS: Alignment: Near normal.  There is a minimal convex right scoliosis. Vertebrae: 12 rib-bearing thoracic type vertebral bodies. There is a mild superior endplate compression deformity/Schmorl's node at T4. This appears new from 2014 CT and radiographs of 2015, although is stable from the studies of last week. No other fractures are seen.  There is no focal lytic or blastic lesion. Paraspinal and other soft tissues: No evidence of paraspinal hematoma. There is no significant soft tissue stranding around the T4 vertebral body. Previous right shoulder arthroplasty noted. There is extensive aortic and great vessel atherosclerosis. Emphysema and bibasilar atelectasis are noted. There are no suspicious pulmonary findings. Disc levels: Mild degenerative changes are present throughout the cervicothoracic spine. There is mild right foraminal narrowing at T1-2. There are calcified disc protrusions with covering osteophytes centrally at T6-7 and T9-10. No significant spinal stenosis or large soft disc herniation identified. IMPRESSION: 1. Mild acute/subacute superior endplate compression deformity/Schmorl's node at T4, stable from last week but new from 2015 radiographs. 2. No new osseous findings.  3. Mild spondylosis. No large disc herniation or significant spinal stenosis demonstrated. 4. Aortic atherosclerosis. Electronically Signed   By: Richardean Sale M.D.   On: 07/12/2016 09:13   Mr Thoracic Spine Wo Contrast  Result Date: 07/19/2016 CLINICAL DATA:  83 y/o F; severe back and hip pain radiating to chest. EXAM: MRI THORACIC AND LUMBAR SPINE WITHOUT CONTRAST TECHNIQUE: Multiplanar and multiecho pulse sequences of the thoracic and lumbar spine were obtained without intravenous contrast. COMPARISON:  07/14/2016 CT chest and 07/12/2016 CT thoracic spine. FINDINGS: MRI THORACIC SPINE FINDINGS Alignment:  Normal thoracic kyphosis. Vertebrae: No abnormal bone marrow disc edema to suggest fracture or discitis. 20% anterior loss of height of T4 vertebral body in central Schmorl's node without edema. Cord: No abnormal cord signal. The left-sided T11-12 9 mm nerve root sheath cyst. Paraspinal and other soft tissues: Negative. Disc levels: Multilevel disc desiccation and disc space narrowing greatest in the midthoracic spine. T6-7: Small central disc protrusion  with anterior cord contact. No significant canal stenosis. T7-8: Small right central disc protrusion with effacement of ventral thecal sac. No significant canal stenosis. T9-10: Small left central disc protrusion with ventral thecal sac effacement. No significant canal stenosis. MRI LUMBAR SPINE FINDINGS Segmentation:  Standard. Alignment:  Physiologic. Vertebrae: Mild endplate edema at the U3-1 and L4-5 levels, likely degenerative. No evidence for acute fracture. T1/T2 hyperintense focus in the L1 vertebral body probably representing hemangioma. Trace multilevel degenerative facet effusions bilaterally greatest at the L5-S1 level. Conus medullaris: Extends to the L1 level and appears normal. Bilateral T12-L1 nerve root sheath cysts. Question tiny fatty filum (series 7, image 12) Paraspinal and other soft tissues: Negative. Disc levels: L1-2: Small left subarticular and foraminal protrusion with mild left foraminal narrowing. No significant canal stenosis. L2-3: Small disc bulge with marginal osteophytes eccentric to right foraminal and extraforaminal zone with mild facet hypertrophy. Mild bilateral foraminal narrowing. No significant canal stenosis. L3-4: Small disc bulge with right-sided marginal foraminal and extraforaminal osteophyte. With moderate facet and ligamentum flavum hypertrophy. Mild bilateral foraminal narrowing. Mild canal stenosis. Disc contact on the right-sided descending L4 nerve root in the right lateral recess. L4-5: Small disc bulge with marginal osteophytes, facet hypertrophy, and ligamentum flavum hypertrophy greater on the left. Moderate left and mild right foraminal narrowing. Mild canal stenosis. L5-S1: Small disc bulge with left-greater-than-right facet and ligamentum flavum hypertrophy. Moderate left and mild right foraminal narrowing. No significant canal stenosis. IMPRESSION: MR THORACIC SPINE IMPRESSION 1. 20% anterior loss of height of the T4 vertebral body with superior endplate  central Schmorl's node. No significant vertebral body edema, likely chronic. 2. No acute osseous abnormality or abnormal cord signal identified. 3. Mild thoracic spine spondylosis without significant foraminal narrowing or canal stenosis. MR LUMBAR SPINE IMPRESSION 1. Multilevel likely degenerative trace facet effusions greatest at the L5-S1 level. 2. Mild multifactorial L3-4 and L4-5 canal stenosis. No high-grade canal stenosis. 3. Multilevel mild foraminal narrowing with moderate left foraminal narrowing at the L4-5 and L5-S1 levels. Electronically Signed   By: Kristine Garbe M.D.   On: 07/19/2016 14:36   Mr Lumbar Spine Wo Contrast  Result Date: 07/19/2016 CLINICAL DATA:  83 y/o F; severe back and hip pain radiating to chest. EXAM: MRI THORACIC AND LUMBAR SPINE WITHOUT CONTRAST TECHNIQUE: Multiplanar and multiecho pulse sequences of the thoracic and lumbar spine were obtained without intravenous contrast. COMPARISON:  07/14/2016 CT chest and 07/12/2016 CT thoracic spine. FINDINGS: MRI THORACIC SPINE FINDINGS Alignment:  Normal thoracic kyphosis. Vertebrae: No abnormal bone  marrow disc edema to suggest fracture or discitis. 20% anterior loss of height of T4 vertebral body in central Schmorl's node without edema. Cord: No abnormal cord signal. The left-sided T11-12 9 mm nerve root sheath cyst. Paraspinal and other soft tissues: Negative. Disc levels: Multilevel disc desiccation and disc space narrowing greatest in the midthoracic spine. T6-7: Small central disc protrusion with anterior cord contact. No significant canal stenosis. T7-8: Small right central disc protrusion with effacement of ventral thecal sac. No significant canal stenosis. T9-10: Small left central disc protrusion with ventral thecal sac effacement. No significant canal stenosis. MRI LUMBAR SPINE FINDINGS Segmentation:  Standard. Alignment:  Physiologic. Vertebrae: Mild endplate edema at the F6-2 and L4-5 levels, likely degenerative.  No evidence for acute fracture. T1/T2 hyperintense focus in the L1 vertebral body probably representing hemangioma. Trace multilevel degenerative facet effusions bilaterally greatest at the L5-S1 level. Conus medullaris: Extends to the L1 level and appears normal. Bilateral T12-L1 nerve root sheath cysts. Question tiny fatty filum (series 7, image 12) Paraspinal and other soft tissues: Negative. Disc levels: L1-2: Small left subarticular and foraminal protrusion with mild left foraminal narrowing. No significant canal stenosis. L2-3: Small disc bulge with marginal osteophytes eccentric to right foraminal and extraforaminal zone with mild facet hypertrophy. Mild bilateral foraminal narrowing. No significant canal stenosis. L3-4: Small disc bulge with right-sided marginal foraminal and extraforaminal osteophyte. With moderate facet and ligamentum flavum hypertrophy. Mild bilateral foraminal narrowing. Mild canal stenosis. Disc contact on the right-sided descending L4 nerve root in the right lateral recess. L4-5: Small disc bulge with marginal osteophytes, facet hypertrophy, and ligamentum flavum hypertrophy greater on the left. Moderate left and mild right foraminal narrowing. Mild canal stenosis. L5-S1: Small disc bulge with left-greater-than-right facet and ligamentum flavum hypertrophy. Moderate left and mild right foraminal narrowing. No significant canal stenosis. IMPRESSION: MR THORACIC SPINE IMPRESSION 1. 20% anterior loss of height of the T4 vertebral body with superior endplate central Schmorl's node. No significant vertebral body edema, likely chronic. 2. No acute osseous abnormality or abnormal cord signal identified. 3. Mild thoracic spine spondylosis without significant foraminal narrowing or canal stenosis. MR LUMBAR SPINE IMPRESSION 1. Multilevel likely degenerative trace facet effusions greatest at the L5-S1 level. 2. Mild multifactorial L3-4 and L4-5 canal stenosis. No high-grade canal stenosis. 3.  Multilevel mild foraminal narrowing with moderate left foraminal narrowing at the L4-5 and L5-S1 levels. Electronically Signed   By: Kristine Garbe M.D.   On: 07/19/2016 14:36   Dg Esophagus  Result Date: 07/15/2016 CLINICAL DATA:  Dysphagia, hoarseness EXAM: ESOPHOGRAM/BARIUM SWALLOW TECHNIQUE: Single contrast examination was performed using thin barium and barium tablet. FLUOROSCOPY TIME:  Fluoroscopy Time:  0 minutes 54 second Radiation Exposure Index (if provided by the fluoroscopic device): Number of Acquired Spot Images: 0 COMPARISON:  None. FINDINGS: Mild decrease in esophageal peristalsis. Esophagus mildly distended. Negative for stricture or mass. No mucosal edema or ulceration. No aspiration identified. Negative for hiatal hernia. Barium tablet passed readily into the stomach. IMPRESSION: Mild decrease in esophageal peristalsis. Negative for stricture or hiatal hernia. Barium tablet passed readily into the stomach. Electronically Signed   By: Franchot Gallo M.D.   On: 07/15/2016 13:27   US Renal  Result Date: 07/17/2016 CLINICAL DATA:  Acute kidney injury EXAM: RENAL / URINARY TRACT ULTRASOUND COMPLETE COMPARISON:  None. FINDINGS: Right Kidney: Length: 9.6 cm. Echogenicity within normal limits. No mass or hydronephrosis visualized. Left Kidney: Length: 10.3 cm. Echogenicity within normal limits. No mass or hydronephrosis visualized. Bladder: Appears  normal for degree of bladder distention. IMPRESSION: Normal renal ultrasound. Electronically Signed   By: Ulyses Jarred M.D.   On: 07/17/2016 23:07   Dg Chest Port 1 View  Result Date: 07/12/2016 CLINICAL DATA:  Chest pain and hypertension EXAM: PORTABLE CHEST 1 VIEW COMPARISON:  July 05, 2016 FINDINGS: There is no edema or consolidation. There is mild cardiomegaly with pulmonary vascularity within normal limits. No adenopathy. There is aortic atherosclerosis. There is a total shoulder prosthesis on the right. IMPRESSION: Stable  cardiomegaly. No edema or consolidation. There is aortic atherosclerosis. Electronically Signed   By: Lowella Grip III M.D.   On: 07/12/2016 08:30   Ct Angio Chest/abd/pel For Dissection W And/or W/wo  Result Date: 07/14/2016 CLINICAL DATA:  Inpatient.  Chest pain. EXAM: CT ANGIOGRAPHY CHEST, ABDOMEN AND PELVIS TECHNIQUE: Multidetector CT imaging through the chest, abdomen and pelvis was performed using the standard protocol during bolus administration of intravenous contrast. Multiplanar reconstructed images and MIPs were obtained and reviewed to evaluate the vascular anatomy. CONTRAST:  100 cc Isovue 370 IV. COMPARISON:  Chest radiograph from earlier today. 07/05/2016 CT angiogram of the chest, abdomen and pelvis. FINDINGS: CTA CHEST FINDINGS Cardiovascular: Normal heart size. No significant pericardial fluid/thickening. Atherosclerotic nonaneurysmal thoracic aorta. No acute intramural hematoma, dissection, pseudoaneurysm or penetrating atherosclerotic ulcer in the thoracic aorta. Aortic arch branch vessels are patent. Stable top-normal caliber pulmonary arteries (main pulmonary artery diameter 3.2 cm). No central pulmonary emboli. Mediastinum/Nodes: No discrete thyroid nodules. Unremarkable esophagus. No pathologically enlarged axillary, mediastinal or hilar lymph nodes. Lungs/Pleura: No pneumothorax. No pleural effusion. Solid 6 mm peripheral right lower lobe pulmonary nodule (series 8/ image 98), stable since 07/05/2016, increased from 3 mm on 02/17/2013. No acute consolidative airspace disease, lung masses or additional significant pulmonary nodules. Mosaic attenuation throughout both lungs, unchanged. Musculoskeletal: No aggressive appearing focal osseous lesions. Mild superior T4 vertebral compression fracture is stable since 07/05/2016, new since 02/17/2013. Moderate thoracic spondylosis. Review of the MIP images confirms the above findings. CTA ABDOMEN AND PELVIS FINDINGS VASCULAR Aorta:  Atherosclerotic nonaneurysmal abdominal aorta with no dissection, vasculitis or significant stenosis. Celiac: High-grade proximal celiac artery stenosis, stable since 07/05/2016. No aneurysm, dissection or vasculitis. SMA: Moderate to high-grade SMA ostial stenosis, stable since 07/05/2016. No aneurysm, dissection or vasculitis. Renals: Patent single renal arteries bilaterally without appreciable high-grade stenosis. IMA: Moderate ostial IMA stenosis, stable since 07/05/2016. No aneurysm, dissection or vasculitis. Inflow: Patent without evidence of aneurysm, dissection, vasculitis or significant stenosis. Veins: No obvious venous abnormality within the limitations of this arterial phase study. Review of the MIP images confirms the above findings. NON-VASCULAR Hepatobiliary: Normal liver with no liver mass. Normal gallbladder with no radiopaque cholelithiasis. No biliary ductal dilatation. Stable small periampullary duodenal diverticulum. Pancreas: Normal, with no mass or duct dilation. Spleen: Normal size. No mass. Adrenals/Urinary Tract: Normal adrenals. No hydronephrosis. No contour deforming renal mass. Normal bladder. Stomach/Bowel: Small hiatal hernia. Otherwise collapsed and grossly normal stomach. Normal caliber small bowel with no small bowel wall thickening. Appendix not discretely visualized. No pericecal inflammatory changes. Moderate sigmoid diverticulosis, with no large bowel wall thickening or pericolonic fat stranding. Vascular/Lymphatic: Atherosclerotic nonaneurysmal abdominal aorta. No pathologically enlarged lymph nodes in the abdomen or pelvis. Reproductive: Status post hysterectomy, with no abnormal findings at the vaginal cuff. No adnexal mass. Other: No pneumoperitoneum, ascites or focal fluid collection. Musculoskeletal: No aggressive appearing focal osseous lesions. Marked lumbar spondylosis. Review of the MIP images confirms the above findings. IMPRESSION: 1. No acute aortic syndrome.  Atherosclerotic nonaneurysmal aorta. 2. Mild subacute T4 vertebral compression fracture, stable since 07/05/2016, new since 02/17/2013. 3. Solitary solid 6 mm right lower lobe pulmonary nodule. Non-contrast chest CT at 6-12 months is recommended. If the nodule is stable at time of repeat CT, then future CT at 18-24 months (from today's scan) is considered optional for low-risk patients, but is recommended for high-risk patients. This recommendation follows the consensus statement: Guidelines for Management of Incidental Pulmonary Nodules Detected on CT Images: From the Fleischner Society 2017; Radiology 2017; 284:228-243. 4. High-grade proximal celiac and SMA stenoses, stable since 07/05/2016. 5. Small hiatal hernia. 6. Moderate sigmoid diverticulosis. Electronically Signed   By: Ilona Sorrel M.D.   On: 07/14/2016 19:21   Ct Angio Chest/abd/pel For Dissection W And/or W/wo  Result Date: 07/05/2016 CLINICAL DATA:  Central chest pain radiating to back EXAM: CT ANGIOGRAPHY CHEST, ABDOMEN AND PELVIS TECHNIQUE: Multidetector CT imaging through the chest, abdomen and pelvis was performed using the standard protocol during bolus administration of intravenous contrast. Multiplanar reconstructed images and MIPs were obtained and reviewed to evaluate the vascular anatomy. CONTRAST:  100 cc Isovue 370 IV COMPARISON:  CT chest 02/17/2013 FINDINGS: CTA CHEST FINDINGS Cardiovascular: Atherosclerotic calcifications aorta and proximal great vessels. Aorta normal caliber. No high attenuation mural crescent to suggest intramural hemorrhage. Normal aortic enhancement without dissection. Pulmonary arteries patent without evidence of pulmonary embolism. Mediastinum/Nodes: No definite esophageal abnormalities. Base of cervical region normal appearance. No thoracic adenopathy. Lungs/Pleura: Scattered respiratory motion artifacts. Minimal dependent density at lung bases. Minimal subsegmental atelectasis LEFT lower lobe. 6 mm RIGHT  lower lobe nodule image 77. No definite infiltrate, pleural effusion, or pneumothorax. Musculoskeletal: RIGHT shoulder prosthesis with associated beam hardening artifacts. Osseous demineralization. Superior endplate compression fracture of T4 vertebral body, age-indeterminate but new since 2014. Osseous demineralization. Review of the MIP images confirms the above findings. CTA ABDOMEN AND PELVIS FINDINGS VASCULAR Aorta: Abdominal aorta normal caliber with scattered atherosclerotic calcifications. No evidence of aortic dissection. Celiac: Atherosclerotic calcification at origin with probably less than 50% narrowing. SMA: High-grade stenosis at the origin of the SMA. Renals: Approximate 50% diameter narrowing at the origin of the RIGHT renal artery. Less than 50% diameter narrowing at the origin of the LEFT renal artery. IMA: Patent with greater than 50% narrowing at the origin Inflow: Scattered atherosclerotic plaques at the common and external iliac arteries with a mild degree of narrowing at the proximal RIGHT common iliac artery. Veins: Portal vein patent. Splenic vein patent. SMV and IVC un opacified. Pelvic veins un opacified. Review of the MIP images confirms the above findings. NON-VASCULAR Hepatobiliary: Liver and gallbladder normal appearance Pancreas: Normal appearance Spleen: Patchy enhancement consistent with timing of imaging versus contrast administration. No gross mass. Adrenals/Urinary Tract: Adrenal glands, kidneys, ureters, and bladder normal appearance. Stomach/Bowel: Appendix not definitely visualized. Stomach and small bowel loops normal appearance. Significant diverticulosis of sigmoid colon without evidence of diverticulitis. Unable to exclude rectal wall thickening though this could be an artifact from underdistention. Lymphatic: No significant adenopathy. Reproductive: Uterus surgically absent. Normal sized LEFT ovary. RIGHT ovary not definitely visualized. Other: No free air or free fluid.  No hernia or inflammatory process. Musculoskeletal: Osseous demineralization with degenerative disc disease changes of lumbar spine Review of the MIP images confirms the above findings. IMPRESSION: Aortic atherosclerosis with a areas of narrowing identified at the origins of the celiac, SMA, IMA, and renal arteries. No evidence of aortic aneurysm or dissection. Sigmoid diverticulosis without evidence of diverticulitis. Superior endplate T4 compression  fracture with mild anterior height loss, indeterminate in appearance but new since 2014. 6 mm RIGHT lower lobe nodule, recommendation below. Non-contrast chest CT at 6-12 months is recommended. If the nodule is stable at time of repeat CT, then future CT at 18-24 months (from today's scan) is considered optional for low-risk patients, but is recommended for high-risk patients. This recommendation follows the consensus statement: Guidelines for Management of Incidental Pulmonary Nodules Detected on CT Images: From the Fleischner Society 2017; Radiology 2017; 284:228-243. Electronically Signed   By: Lavonia Dana M.D.   On: 07/05/2016 15:07   Dg Hip Unilat With Pelvis 2-3 Views Right  Result Date: 07/16/2016 CLINICAL DATA:  Right hip pain without known injury. EXAM: DG HIP (WITH OR WITHOUT PELVIS) 2-3V RIGHT COMPARISON:  None. FINDINGS: There is no evidence of hip fracture or dislocation. There is no evidence of arthropathy or other focal bone abnormality. IMPRESSION: Normal right hip. Electronically Signed   By: Marijo Conception, M.D.   On: 07/16/2016 10:55   Ct Extrem Lower W Cm Bil  Result Date: 07/12/2016 CLINICAL DATA:  Right leg pain and erythema. EXAM: CT OF THE LOWER BILATERAL EXTREMITY WITH CONTRAST TECHNIQUE: Multidetector CT imaging of the lower bilateral extremity was performed according to the standard protocol following intravenous contrast administration. COMPARISON:  None. CONTRAST:  199mL ISOVUE-300 IOPAMIDOL (ISOVUE-300) INJECTION 61% FINDINGS: No  fracture or other acute bone abnormality. No bone lesion or bony destruction. There is mild skin thickening and slight subcutaneous stranding opacity in the medial right by, series 3 image 126-162. This is consistent with cellulitis. No abscess or drainable collection. No soft tissue gas. No significant muscular abnormality. IMPRESSION: Skin thickening and mild subcutaneous stranding consistent with cellulitis. No abscess or drainable collection. No bone or muscular abnormality. Electronically Signed   By: Andreas Newport M.D.   On: 07/12/2016 05:47    Microbiology: No results found for this or any previous visit (from the past 240 hour(s)).   Labs: Basic Metabolic Panel:  Recent Labs Lab 07/14/16 1143 07/14/16 1723 07/16/16 0738 07/17/16 0306 07/19/16 0318 07/20/16 0247  NA 134*  --  134* 136 138 137  K 4.2  --  4.3 4.6 4.8 4.6  CL 96*  --  100* 100* 104 103  CO2 29  --  27 28 27 28   GLUCOSE 153*  --  149* 112* 106* 112*  BUN 12  --  12 17 18 16   CREATININE 1.06* 1.07* 1.23* 1.41* 1.15* 1.14*  CALCIUM 9.0  --  9.0 8.7* 8.4* 8.8*  PHOS  --   --   --   --   --  3.4   Liver Function Tests:  Recent Labs Lab 07/14/16 1132 07/20/16 0247  AST 21  --   ALT 15  --   ALKPHOS 68  --   BILITOT 0.4  --   PROT 6.7  --   ALBUMIN 2.8* 2.8*   No results for input(s): LIPASE, AMYLASE in the last 168 hours. No results for input(s): AMMONIA in the last 168 hours. CBC:  Recent Labs Lab 07/14/16 1143 07/14/16 1619 07/14/16 1723 07/16/16 0738 07/17/16 0306 07/19/16 0318  WBC 6.9  --  6.8 7.8 6.6 7.0  NEUTROABS 4.4 3.7  --   --   --   --   HGB 10.8*  --  10.7* 11.3* 9.9* 9.6*  HCT 32.7*  --  32.5* 33.8* 30.7* 29.6*  MCV 91.1  --  90.8 90.9 91.9 93.4  PLT 225  --  211 217 213 196   Cardiac Enzymes:  Recent Labs Lab 07/14/16 1723  TROPONINI <0.03   BNP: BNP (last 3 results)  Recent Labs  07/14/16 1132  BNP 94.4    ProBNP (last 3 results) No results for input(s):  PROBNP in the last 8760 hours.  CBG:  Recent Labs Lab 07/19/16 0752 07/19/16 1445 07/19/16 1701 07/19/16 2126 07/20/16 0752  GLUCAP 101* 107* 179* 114* 100*       Signed:  Nita Sells MD   [facility] 07/20/2016, 11:38 AM

## 2016-07-23 ENCOUNTER — Encounter: Payer: Self-pay | Admitting: Vascular Surgery

## 2016-07-23 ENCOUNTER — Ambulatory Visit (INDEPENDENT_AMBULATORY_CARE_PROVIDER_SITE_OTHER): Payer: Medicare Other | Admitting: Vascular Surgery

## 2016-07-23 VITALS — BP 185/83 | HR 82 | Temp 97.9°F | Resp 20 | Ht 66.0 in | Wt 210.0 lb

## 2016-07-23 DIAGNOSIS — K551 Chronic vascular disorders of intestine: Secondary | ICD-10-CM

## 2016-07-23 NOTE — Progress Notes (Signed)
Referred by:  Sharilyn Sites, MD [id number] [place], [place] [address]   Reason for referral: possible mesenteric stenosis on CTA    History of Present Illness   Sandra Cuevas is a 83 y.o. ([birth year]) female who presents with cc: chest pain.  This patient presented to the ED with chest pain on 07/14/16.  CTA chest/abd/pelvis was completed to rule out aortic dissection.  In this process, incidental mesenteric stenoses were found.  The patient denies any loss of weight, post-prandial pain, or food fear.  She denies any hematochezia or melena.    Her sx are sub-sternal chest pressure without radiation.  She has known history of hiatal hernia without frank reflux symptoms.  She has been scoped previously for chronically hoarse voice.   Past Medical History:  Diagnosis Date  . Atypical chest pain   . 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 Diabetes mellitus   . GERD (gastroesophageal reflux disease)   . History of cardiovascular stress test 06/01/2010   EF 71% - no evidence of ischemia, normal left ventricular systolic function  . History of hysterectomy 1965  . Hyperlipidemia   . Hypertension   . Hypothyroidism   . Shortness of breath    on exertion    Past Surgical History:  Procedure Laterality Date  . ABDOMINAL HYSTERECTOMY    . CARDIAC SURGERY     catherization  . HEMORRHOID SURGERY      Social History   Social History  . Marital status: Widowed    Spouse name: N/A  . Number of children: N/A  . Years of education: N/A   Occupational History  . Not on file.   Social History Main Topics  . Smoking status: Never Smoker  . Smokeless tobacco: Never Used  . Alcohol use No  . Drug use: No  . Sexual activity: No   Other Topics Concern  . Not on file   Social History Narrative  . No narrative on file    Family History  Problem Relation Age of Onset  . Heart disease Father     heart problems  . Kidney failure Mother   . Diabetes Mother     . Cancer Brother   . Diabetes Brother     Current Outpatient Prescriptions  Medication Sig Dispense Refill  . acetaminophen (TYLENOL) 500 MG tablet Take 2 tablets (1,000 mg total) by mouth every 6 (six) hours as needed. 30 tablet 0  . albuterol (PROVENTIL HFA;VENTOLIN HFA) 108 (90 BASE) MCG/ACT inhaler Inhale 2 puffs into the lungs 2 (two) times daily.    [place] Kitchen albuterol (PROVENTIL) (5 MG/ML) 0.5% nebulizer solution Take 0.5 mLs (2.5 mg total) by nebulization every 4 (four) hours as needed for wheezing or shortness of breath. 20 mL 12  . amLODipine (NORVASC) 10 MG tablet Take 1 tablet (10 mg total) by mouth daily. 30 tablet 0  . atorvastatin (LIPITOR) 10 MG tablet Take 10 mg by mouth Daily.     . budesonide-formoterol (SYMBICORT) 160-4.5 MCG/ACT inhaler Inhale 2 puffs into the lungs 2 (two) times daily. 1 Inhaler 12  . diclofenac sodium (VOLTAREN) 1 % GEL Apply 2 g topically 4 (four) times daily. 100 g 0  . docusate sodium (COLACE) 100 MG capsule Take 200 mg by mouth at bedtime.    . gabapentin (NEURONTIN) 100 MG capsule Take 1 capsule (100 mg total) by mouth at bedtime. 39 capsule 0  . hydrALAZINE (APRESOLINE) 25  MG tablet Take 1 tablet (25 mg total) by mouth every 8 (eight) hours. 90 tablet 0  . levothyroxine (SYNTHROID, LEVOTHROID) 50 MCG tablet Take 50 mcg by mouth daily.      [place] Kitchen lidocaine (LIDODERM) 5 % Place 1 patch onto the skin daily. Remove & Discard patch within 12 hours or as directed by MD 30 patch 0  . metFORMIN (GLUCOPHAGE) 500 MG tablet Take 1 tablet (500 mg total) by mouth 2 (two) times daily with a meal.    . montelukast (SINGULAIR) 10 MG tablet Take 10 mg by mouth daily.     [place] Kitchen omeprazole (PRILOSEC) 20 MG capsule Take 20 mg by mouth 2 (two) times daily before a meal.    . oxyCODONE-acetaminophen (PERCOCET/ROXICET) 5-325 MG tablet Take 1 tablet by mouth every 4 (four) hours as needed for moderate pain or severe pain. 30 tablet 0  . polyethylene glycol (MIRALAX / GLYCOLAX) packet  Take 17 g by mouth daily. 14 each 0  . Polyvinyl Alcohol-Povidone (MURINE TEARS FOR DRY EYES) 5-6 MG/ML SOLN Place 1 drop into both eyes daily as needed (Dry Eyes).    . potassium chloride (MICRO-K) 10 MEQ CR capsule Take 10 mEq by mouth daily.      No current facility-administered medications for this visit.     No Known Allergies   REVIEW OF SYSTEMS:   Cardiac:  positive for: Chest pain or chest pressure, negative for: Shortness of breath upon exertion and Shortness of breath when lying flat,   Vascular:  positive for: no symptoms,  negative for: Pain in calf, thigh, or hip brought on by ambulation, Pain in feet at night that wakes you up from your sleep, Blood clot in your veins and Leg swelling  Pulmonary:  positive for: no symptoms,  negative for: Oxygen at home, Productive cough and Wheezing  Neurologic:  positive for: No symptoms, negative for: Sudden weakness in arms or legs, Sudden numbness in arms or legs, Sudden onset of difficulty speaking or slurred speech, Temporary loss of vision in one eye and Problems with dizziness  Gastrointestinal:  positive for: no symptoms, negative for: Blood in stool and Vomited blood  Genitourinary:  positive for: no symptoms, negative for: Burning when urinating and Blood in urine  Psychiatric:  positive for: no symptoms,  negative for: Major depression  Hematologic:  positive for: no symptoms,  negative for: negative for: Bleeding problems and Problems with blood clotting too easily  Dermatologic:  positive for: abnormal skin lesions, negative for: Rashes or ulcers and abnormal skin lesions  Constitutional:  positive for: no symptoms, negative for: Fever or chills  Ear/Nose/Throat:  positive for: no symptoms, negative for: Change in hearing, Nose bleeds and Sore throat  Musculoskeletal:  positive for: cellulitis in leg, negative for: Back pain, Joint pain and Muscle pain   Physical Examination   Vitals:    07/23/16 1522  BP: (!) 185/83  Pulse: 82  Resp: 20  Temp: 97.9 F (36.6 C)  TempSrc: Oral  SpO2: 95%  Weight: 210 lb (95.3 kg)  Height: 5\' 6"  (1.676 m)    Body mass index is 33.89 kg/m.  General Alert, O x 3, WD, NAD  [place]/AT,    Ear/Nose/Throat Hearing grossly intact, nares without erythema or drainage, oropharynx without Erythema without Exudate, Mallampati score: 3, Dental caries  Eyes PERRLA, EOMI,    Neck Supple, mid-line trachea,    Pulmonary Sym exp, good B air movt, CTA B  Cardiac RRR, Nl S1, S2,  no Murmurs, No rubs, No S3,S4  Vascular Vessel Right Left  Radial Faintly palpable Faintly palpable  Brachial Palpable Palpable  Carotid Palpable Palpable  Aorta Not palpable due to pannus N/A  Femoral Palpable Palpable  Popliteal Not palpable Not palpable  PT Not palpable Not palpable  DP Not palpable Not palpable    Gastrointestinal soft, non-distended, non-tender to palpation, No guarding or rebound, no HSM, no masses, no CVAT B, No palpable prominent aortic pulse,    Musculoskeletal M/S 5/5 throughout  , Extremities without ischemic changes  , Edema present: B 1+, ,   Neurologic Cranial nerves 2-12 intact , Pain and light touch intact in extremities , Motor exam as listed above  Psychiatric Judgement intact, Mood & affect appropriate for pt's clinical situation  Dermatologic See M/S exam for extremity exam, No rashes otherwise noted  Lymphatic  Palpable lymph nodes: None    CTA chest/abd/pelvis (07/14/16) 1. No acute aortic syndrome.  Atherosclerotic nonaneurysmal aorta. 2. Mild subacute T4 vertebral compression fracture, stable since 07/05/2016, new since 02/17/2013. 3. Solitary solid 6 mm right lower lobe pulmonary nodule. Non-contrast chest CT at 6-12 months is recommended. If the nodule is stable at time of repeat CT, then future CT at 18-24 months (from today's scan) is considered optional for low-risk patients, but is recommended for high-risk patients. This  recommendation follows the consensus statement: Guidelines for Management of Incidental Pulmonary Nodules Detected on CT Images: From the Fleischner Society 2017; Radiology 2017; 284:228-243. 4. High-grade proximal celiac and SMA stenoses, stable since 07/05/2016. 5. Small hiatal hernia. 6. Moderate sigmoid diverticulosis.  I reviewed this patient's CTA, I don't think you can grade the stenoses of the celiac and SMA on these CTA.  I think the radiologist is using the reconstruction to make the high grade grading, which would not be the correct method for reading this scan.  Rather I would say that you have to correlate a possible mesenteric stenosis with clinical findings.   Outside Studies/Documentation   10 pages of outside documents were reviewed including: recent admission record   Medical Decision Making   Sandra Cuevas is a 83 y.o. female who presents with: chest pain of unknown etiology, no signs or sx of chronic mesenteric ischemia   Some of this patient's chest pain sx are consistent with possible reflux but the positional change in sx would not be consistent with such as her sx worsen with upright position.  In regards to mesenteric ischemia, she has no signs and no sx.    I think the CTA is over read for mesenteric stenosis, though it still might be possible.  In my opinion it is hard to offer mesenteric angiography to this patient given the low pre-test probability of finding clinical significant mesenteric stenosis, i.e. the anticipated benefits do not justify the risk.  If the patient develops any mesenteric ischemia sx, feel free to refer the patient back.  Thank you for allowing [place] to participate in this patient's care.   Adele Barthel, MD, FACS [facility] Office: [phone] Pager: [phone]  07/23/2016, 4:16 PM

## 2016-07-24 DIAGNOSIS — N182 Chronic kidney disease, stage 2 (mild): Secondary | ICD-10-CM | POA: Diagnosis not present

## 2016-07-24 DIAGNOSIS — J45909 Unspecified asthma, uncomplicated: Secondary | ICD-10-CM | POA: Diagnosis not present

## 2016-07-24 DIAGNOSIS — I130 Hypertensive heart and chronic kidney disease with heart failure and stage 1 through stage 4 chronic kidney disease, or unspecified chronic kidney disease: Secondary | ICD-10-CM | POA: Diagnosis not present

## 2016-07-24 DIAGNOSIS — E1122 Type 2 diabetes mellitus with diabetic chronic kidney disease: Secondary | ICD-10-CM | POA: Diagnosis not present

## 2016-07-24 DIAGNOSIS — J449 Chronic obstructive pulmonary disease, unspecified: Secondary | ICD-10-CM | POA: Diagnosis not present

## 2016-07-24 DIAGNOSIS — I5032 Chronic diastolic (congestive) heart failure: Secondary | ICD-10-CM | POA: Diagnosis not present

## 2016-07-25 ENCOUNTER — Telehealth: Payer: Self-pay | Admitting: Emergency Medicine

## 2016-07-25 ENCOUNTER — Telehealth: Payer: Self-pay | Admitting: Cardiovascular Disease

## 2016-07-25 DIAGNOSIS — R0789 Other chest pain: Secondary | ICD-10-CM | POA: Diagnosis not present

## 2016-07-25 DIAGNOSIS — R079 Chest pain, unspecified: Secondary | ICD-10-CM | POA: Diagnosis not present

## 2016-07-25 DIAGNOSIS — I452 Bifascicular block: Secondary | ICD-10-CM | POA: Diagnosis not present

## 2016-07-25 NOTE — Telephone Encounter (Signed)
Called spoke with pt's sister in law [place].  Pt is having difficulty talking due to hoarseness, also having increased SOB, some pain in her left arm, heaviness in her chest.  Pt was discharged from the hospital on 4.14.18 - she was feeling better while admitted and shortly after discharge because she was taking Morphine but now that she has been home her symptoms are worsening.  Due to pt's symptoms and how uncomfortable Pamala Hurry felt the patient is feeling, did discuss with Pamala Hurry about pt returning to the hospital.  Pamala Hurry (and pt in the background) did not want to go this route because they feel "they're not going to do anything different than they already did."    There are no openings with any provider today for appt RB is nightfloat this week Routing message to doc of the day - CY please advise, thank you. Also printing hospital discharge for CY to review if needed  [employer]

## 2016-07-25 NOTE — Telephone Encounter (Signed)
LMTCB again for [facility]

## 2016-07-25 NOTE — Telephone Encounter (Signed)
Pt c/o of Chest Pain: STAT if CP now or developed within 24 hours  1. Are you having CP right now? no  2. Are you experiencing any other symptoms (ex. SOB, nausea, vomiting, sweating)? bp high  3. How long have you been experiencing CP? 2 weeks 4. Is your CP continuous or coming and going? Coming and going 5. Have you taken Nitroglycerin? no ?

## 2016-07-25 NOTE — Telephone Encounter (Signed)
LMOM TCB x1 for [facility]

## 2016-07-25 NOTE — Telephone Encounter (Signed)
Attempted to call patient, no answer 

## 2016-07-25 NOTE — Telephone Encounter (Signed)
Left message for patient to call back  

## 2016-07-25 NOTE — Telephone Encounter (Signed)
I reviewed discharge summary and last visit note with Dr Lamonte Sakai. These seem to be chronic problems, maybe worse with pollen. We can let her try a Bevespi inhaler sample instead of Symbicort for comparison- inhale 2 puffs, twice daily. She is followed by Cardiology and may need to see them again.

## 2016-07-26 NOTE — Telephone Encounter (Signed)
lmtcb for [place].

## 2016-07-29 DIAGNOSIS — E6609 Other obesity due to excess calories: Secondary | ICD-10-CM | POA: Diagnosis not present

## 2016-07-29 DIAGNOSIS — E1122 Type 2 diabetes mellitus with diabetic chronic kidney disease: Secondary | ICD-10-CM | POA: Diagnosis not present

## 2016-07-29 DIAGNOSIS — I130 Hypertensive heart and chronic kidney disease with heart failure and stage 1 through stage 4 chronic kidney disease, or unspecified chronic kidney disease: Secondary | ICD-10-CM | POA: Diagnosis not present

## 2016-07-29 DIAGNOSIS — J449 Chronic obstructive pulmonary disease, unspecified: Secondary | ICD-10-CM | POA: Diagnosis not present

## 2016-07-29 DIAGNOSIS — L0390 Cellulitis, unspecified: Secondary | ICD-10-CM | POA: Diagnosis not present

## 2016-07-29 DIAGNOSIS — Z6832 Body mass index (BMI) 32.0-32.9, adult: Secondary | ICD-10-CM | POA: Diagnosis not present

## 2016-07-29 DIAGNOSIS — I5032 Chronic diastolic (congestive) heart failure: Secondary | ICD-10-CM | POA: Diagnosis not present

## 2016-07-29 DIAGNOSIS — M940 Chondrocostal junction syndrome [Tietze]: Secondary | ICD-10-CM | POA: Diagnosis not present

## 2016-07-29 DIAGNOSIS — N182 Chronic kidney disease, stage 2 (mild): Secondary | ICD-10-CM | POA: Diagnosis not present

## 2016-07-29 DIAGNOSIS — J45909 Unspecified asthma, uncomplicated: Secondary | ICD-10-CM | POA: Diagnosis not present

## 2016-07-29 DIAGNOSIS — E063 Autoimmune thyroiditis: Secondary | ICD-10-CM | POA: Diagnosis not present

## 2016-07-29 DIAGNOSIS — E119 Type 2 diabetes mellitus without complications: Secondary | ICD-10-CM | POA: Diagnosis not present

## 2016-07-29 NOTE — Telephone Encounter (Signed)
Called and spoke with pt and she stated that her daughter has made her an appt this morning to see someone and she stated that if needed, her daughter will call back.  Will close this message at this time.

## 2016-07-29 NOTE — Telephone Encounter (Signed)
Spoke with patient to find out if she is feeling better. She states she saw her PCP this morning for chest pain and she was prescribed a medication and given a shot for arthritis; she states "I don't think it is my heart." She states she would like to give this medication some time to see if it helps her. I advised that Dr. Acie Fredrickson will be in the office after tomorrow and to call back if she feels like she needs to be seen. She verbalized understanding and agreement and thanked me for the call.

## 2016-07-30 DIAGNOSIS — I130 Hypertensive heart and chronic kidney disease with heart failure and stage 1 through stage 4 chronic kidney disease, or unspecified chronic kidney disease: Secondary | ICD-10-CM | POA: Diagnosis not present

## 2016-07-30 DIAGNOSIS — J45909 Unspecified asthma, uncomplicated: Secondary | ICD-10-CM | POA: Diagnosis not present

## 2016-07-30 DIAGNOSIS — N182 Chronic kidney disease, stage 2 (mild): Secondary | ICD-10-CM | POA: Diagnosis not present

## 2016-07-30 DIAGNOSIS — E1122 Type 2 diabetes mellitus with diabetic chronic kidney disease: Secondary | ICD-10-CM | POA: Diagnosis not present

## 2016-07-30 DIAGNOSIS — J449 Chronic obstructive pulmonary disease, unspecified: Secondary | ICD-10-CM | POA: Diagnosis not present

## 2016-07-30 DIAGNOSIS — I5032 Chronic diastolic (congestive) heart failure: Secondary | ICD-10-CM | POA: Diagnosis not present

## 2016-08-05 DIAGNOSIS — J45909 Unspecified asthma, uncomplicated: Secondary | ICD-10-CM | POA: Diagnosis not present

## 2016-08-05 DIAGNOSIS — N182 Chronic kidney disease, stage 2 (mild): Secondary | ICD-10-CM | POA: Diagnosis not present

## 2016-08-05 DIAGNOSIS — J449 Chronic obstructive pulmonary disease, unspecified: Secondary | ICD-10-CM | POA: Diagnosis not present

## 2016-08-05 DIAGNOSIS — E1122 Type 2 diabetes mellitus with diabetic chronic kidney disease: Secondary | ICD-10-CM | POA: Diagnosis not present

## 2016-08-05 DIAGNOSIS — I5032 Chronic diastolic (congestive) heart failure: Secondary | ICD-10-CM | POA: Diagnosis not present

## 2016-08-05 DIAGNOSIS — I130 Hypertensive heart and chronic kidney disease with heart failure and stage 1 through stage 4 chronic kidney disease, or unspecified chronic kidney disease: Secondary | ICD-10-CM | POA: Diagnosis not present

## 2016-08-08 ENCOUNTER — Encounter: Payer: Self-pay | Admitting: Emergency Medicine

## 2016-08-08 ENCOUNTER — Ambulatory Visit (INDEPENDENT_AMBULATORY_CARE_PROVIDER_SITE_OTHER): Payer: Medicare Other | Admitting: Emergency Medicine

## 2016-08-08 ENCOUNTER — Telehealth: Payer: Self-pay | Admitting: Gastroenterology

## 2016-08-08 VITALS — BP 156/70 | HR 85 | Ht 66.0 in | Wt 197.6 lb

## 2016-08-08 DIAGNOSIS — K219 Gastro-esophageal reflux disease without esophagitis: Secondary | ICD-10-CM

## 2016-08-08 DIAGNOSIS — R05 Cough: Secondary | ICD-10-CM | POA: Diagnosis not present

## 2016-08-08 DIAGNOSIS — R079 Chest pain, unspecified: Secondary | ICD-10-CM | POA: Diagnosis not present

## 2016-08-08 DIAGNOSIS — R059 Cough, unspecified: Secondary | ICD-10-CM

## 2016-08-08 DIAGNOSIS — J449 Chronic obstructive pulmonary disease, unspecified: Secondary | ICD-10-CM | POA: Diagnosis not present

## 2016-08-08 MED ORDER — PANTOPRAZOLE SODIUM 40 MG PO TBEC: DELAYED_RELEASE_TABLET | ORAL | 0 refills | Status: DC

## 2016-08-08 NOTE — Assessment & Plan Note (Signed)
Persistent, atypical in nature. Some of the features sound very consistent with GERD but then she also describes some findings that are more consistent with musculoskeletal pain. She was tender on palpation of her left upper chest and had difficulty telling me whether this absolutely reproduced her symptoms. I would like to empirically treat her for breakthrough GERD, refer her to gastroenterology for formal evaluation.  We will refer you to see gastroenterology  Please stop your omeprazole for now We will start pantoprazole 40mg  twice a day for 2 weeks, then go to once a day until you see the gastroenterologist.  Follow with Dr Lamonte Sakai in 2 months or sooner if you have any problems.

## 2016-08-08 NOTE — Patient Instructions (Addendum)
We will refer you to see gastroenterology  Please stop your omeprazole for now We will start pantoprazole 40mg  twice a day for 2 weeks, then go to once a day until you see the gastroenterologist.  Continue your Symbicort twice a day. Rinse and gargle after using.  Follow with Dr Lamonte Sakai in 2 months or sooner if you have any problems.

## 2016-08-08 NOTE — Addendum Note (Signed)
Addended by: Lorane Gell on: 08/08/2016 02:34 PM   Modules accepted: Orders

## 2016-08-08 NOTE — Assessment & Plan Note (Signed)
Continue your Symbicort twice a day. Rinse and gargle after using.

## 2016-08-08 NOTE — Assessment & Plan Note (Signed)
Treatment of GERD managed to help this problem as well

## 2016-08-08 NOTE — Telephone Encounter (Signed)
If the patient would like to return to our office, please make her the next available New Patient appointment with any provider (MD or APP), and records will be reviewed at that time.

## 2016-08-08 NOTE — Progress Notes (Signed)
Subjective:    Patient ID: Sandra Cuevas, female    DOB: 11-19-1928, 83 y.o.   MRN: [id number]  HPI 83 year old woman, never smoker, history of hypertension, hyperlipidemia, hypothyroidism, COPD on the basis of chronic asthma. The dx of asthma was first was made in late teens. She was treated initially but she no longer needed meds when she stopped working in [employer].  Pulmonary function testing performed 04/20/13 was reviewed by me today. This shows moderate obstruction without a bronchodilator response. She has been managed on Symbicort twice a day and albuterol when necessary. She is on Dexilant bid, is not using any allergy regimen at this time. She has a chronic hoarse voice, has undergone laryngoscopy before. She is active, is able to exert herself. She has nebs and albuterol HFA to use prn but rarely needs it. No cough. Rare wheeze. No CP.    ROV 09/05/15 -- follow-up visit for COPD in the setting of chronic fixed asthma.also with a chronic hoarse voice. She has seen ENT before - no pathology seen. She is on dexilant. No flares. She has albuterol available to use prn, has not needed it. On scheduled Symbicort. She is having a lot of back trouble that is limiting her activity, not her breathing. No real cough. Her hoarse voice is stable.   Acute office visit 08/08/16 -- patient has a history of COPD / fixed asthma, chronic hoarseness, hiatal hernia and GERD. She presents today for an acute visit to evaluate chest discomfort. She reports that she has been having mid chest pain to her back, beginning in late March. It woke her from sleep. Seems to be present to some degree at all times. She had a CT angiogram 07/14/16 that showed no dissection, no PE, no infiltrates, a 49mm RLL nodule. ECG's were reassuring. She is on omeprazole bid. She has been treated w narcotics, most recently steroids - no relief. She denies dyspnea.    Review of Systems As per HPI  Past Medical History:  Diagnosis Date  .  Atypical chest pain   . 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 Diabetes mellitus   . GERD (gastroesophageal reflux disease)   . History of cardiovascular stress test 06/01/2010   EF 71% - no evidence of ischemia, normal left ventricular systolic function  . History of hysterectomy 1965  . Hyperlipidemia   . Hypertension   . Hypothyroidism   . Shortness of breath    on exertion     Family History  Problem Relation Age of Onset  . Heart disease Father     heart problems  . Kidney failure Mother   . Diabetes Mother   . Cancer Brother   . Diabetes Brother      Social History   Social History  . Marital status: Widowed    Spouse name: N/A  . Number of children: N/A  . Years of education: N/A   Occupational History  . Not on file.   Social History Main Topics  . Smoking status: Never Smoker  . Smokeless tobacco: Never Used  . Alcohol use No  . Drug use: No  . Sexual activity: No   Other Topics Concern  . Not on file   Social History Narrative  . No narrative on file    Used to work at [employer], made cigarettes.  Worked in a [employer] also 13 yrs  No Known Allergies   Outpatient Medications Prior  to Visit  Medication Sig Dispense Refill  . acetaminophen (TYLENOL) 500 MG tablet Take 2 tablets (1,000 mg total) by mouth every 6 (six) hours as needed. 30 tablet 0  . albuterol (PROVENTIL HFA;VENTOLIN HFA) 108 (90 BASE) MCG/ACT inhaler Inhale 2 puffs into the lungs 2 (two) times daily.    Marland Kitchen albuterol (PROVENTIL) (5 MG/ML) 0.5% nebulizer solution Take 0.5 mLs (2.5 mg total) by nebulization every 4 (four) hours as needed for wheezing or shortness of breath. 20 mL 12  . amLODipine (NORVASC) 10 MG tablet Take 1 tablet (10 mg total) by mouth daily. 30 tablet 0  . atorvastatin (LIPITOR) 10 MG tablet Take 10 mg by mouth Daily.     . budesonide-formoterol (SYMBICORT) 160-4.5 MCG/ACT inhaler Inhale 2 puffs into the lungs 2 (two) times daily. 1 Inhaler 12  .  diclofenac sodium (VOLTAREN) 1 % GEL Apply 2 g topically 4 (four) times daily. 100 g 0  . docusate sodium (COLACE) 100 MG capsule Take 200 mg by mouth at bedtime.    . gabapentin (NEURONTIN) 100 MG capsule Take 1 capsule (100 mg total) by mouth at bedtime. 39 capsule 0  . hydrALAZINE (APRESOLINE) 25 MG tablet Take 1 tablet (25 mg total) by mouth every 8 (eight) hours. 90 tablet 0  . levothyroxine (SYNTHROID, LEVOTHROID) 50 MCG tablet Take 50 mcg by mouth daily.      Marland Kitchen lidocaine (LIDODERM) 5 % Place 1 patch onto the skin daily. Remove & Discard patch within 12 hours or as directed by MD 30 patch 0  . metFORMIN (GLUCOPHAGE) 500 MG tablet Take 1 tablet (500 mg total) by mouth 2 (two) times daily with a meal.    . montelukast (SINGULAIR) 10 MG tablet Take 10 mg by mouth daily.     Marland Kitchen omeprazole (PRILOSEC) 20 MG capsule Take 20 mg by mouth 2 (two) times daily before a meal.    . oxyCODONE-acetaminophen (PERCOCET/ROXICET) 5-325 MG tablet Take 1 tablet by mouth every 4 (four) hours as needed for moderate pain or severe pain. 30 tablet 0  . polyethylene glycol (MIRALAX / GLYCOLAX) packet Take 17 g by mouth daily. 14 each 0  . Polyvinyl Alcohol-Povidone (MURINE TEARS FOR DRY EYES) 5-6 MG/ML SOLN Place 1 drop into both eyes daily as needed (Dry Eyes).    . potassium chloride (MICRO-K) 10 MEQ CR capsule Take 10 mEq by mouth daily.      No facility-administered medications prior to visit.          Objective:   Physical Exam Vitals:   08/08/16 1407  BP: (!) 156/70  Pulse: 85  SpO2: 95%  Weight: 197 lb 9.6 oz (89.6 kg)  Height: 5\' 6"  (1.676 m)   Gen: Pleasant, well-nourished, in no distress,  normal affect  ENT: No lesions,  mouth clear,  oropharynx clear, no postnasal drip, hoarse voice  Neck: No JVD, no TMG, no carotid bruits  Lungs: No use of accessory muscles, clear without rales or rhonchi  Cardiovascular: RRR, heart sounds normal, no murmur or gallops, no peripheral  edema  Musculoskeletal: No deformities, She is tender to palpation of the left peristernal region. Difficult to tell from her description as to whether this absolutely reproduces her symptoms  Neuro: alert, non focal  Skin: Warm, no lesions or rashes      Assessment & Plan:  Chest pain Persistent, atypical in nature. Some of the features sound very consistent with GERD but then she also describes some findings that are more  consistent with musculoskeletal pain. She was tender on palpation of her left upper chest and had difficulty telling me whether this absolutely reproduced her symptoms. I would like to empirically treat her for breakthrough GERD, refer her to gastroenterology for formal evaluation.  We will refer you to see gastroenterology  Please stop your omeprazole for now We will start pantoprazole 40mg  twice a day for 2 weeks, then go to once a day until you see the gastroenterologist.  Follow with Dr Lamonte Sakai in 2 months or sooner if you have any problems.  Cough Treatment of GERD managed to help this problem as well  Chronic obstructive airway disease with asthma Continue your Symbicort twice a day. Rinse and gargle after using.   Baltazar Apo, MD, PhD 08/08/2016, 2:28 PM [facility] [phone] or if no answer [phone]

## 2016-08-08 NOTE — Telephone Encounter (Signed)
Patient is being referred to our office for GERD. Patient was a Dr.Kaplan patient but transferred care to [facility] in 2013.  Patient states that she would like to return to our office. Dr.Danis is DOD for afternoon 4.3.18. Records in [facility] to be reviewed.

## 2016-08-09 ENCOUNTER — Encounter: Payer: Self-pay | Admitting: Gastroenterology

## 2016-08-09 NOTE — Telephone Encounter (Signed)
Dr.Danis accepted patient. Patient notified and appt made for 6.6.18

## 2016-08-12 DIAGNOSIS — J449 Chronic obstructive pulmonary disease, unspecified: Secondary | ICD-10-CM | POA: Diagnosis not present

## 2016-08-12 DIAGNOSIS — I130 Hypertensive heart and chronic kidney disease with heart failure and stage 1 through stage 4 chronic kidney disease, or unspecified chronic kidney disease: Secondary | ICD-10-CM | POA: Diagnosis not present

## 2016-08-12 DIAGNOSIS — N182 Chronic kidney disease, stage 2 (mild): Secondary | ICD-10-CM | POA: Diagnosis not present

## 2016-08-12 DIAGNOSIS — J45909 Unspecified asthma, uncomplicated: Secondary | ICD-10-CM | POA: Diagnosis not present

## 2016-08-12 DIAGNOSIS — I5032 Chronic diastolic (congestive) heart failure: Secondary | ICD-10-CM | POA: Diagnosis not present

## 2016-08-12 DIAGNOSIS — E1122 Type 2 diabetes mellitus with diabetic chronic kidney disease: Secondary | ICD-10-CM | POA: Diagnosis not present

## 2016-08-14 ENCOUNTER — Encounter (INDEPENDENT_AMBULATORY_CARE_PROVIDER_SITE_OTHER): Payer: Medicare Other | Admitting: Ophthalmology

## 2016-08-14 DIAGNOSIS — H35033 Hypertensive retinopathy, bilateral: Secondary | ICD-10-CM

## 2016-08-14 DIAGNOSIS — H353113 Nonexudative age-related macular degeneration, right eye, advanced atrophic without subfoveal involvement: Secondary | ICD-10-CM

## 2016-08-14 DIAGNOSIS — I10 Essential (primary) hypertension: Secondary | ICD-10-CM

## 2016-08-14 DIAGNOSIS — H353221 Exudative age-related macular degeneration, left eye, with active choroidal neovascularization: Secondary | ICD-10-CM | POA: Diagnosis not present

## 2016-08-14 DIAGNOSIS — H43813 Vitreous degeneration, bilateral: Secondary | ICD-10-CM

## 2016-08-15 DIAGNOSIS — N182 Chronic kidney disease, stage 2 (mild): Secondary | ICD-10-CM | POA: Diagnosis not present

## 2016-08-15 DIAGNOSIS — I130 Hypertensive heart and chronic kidney disease with heart failure and stage 1 through stage 4 chronic kidney disease, or unspecified chronic kidney disease: Secondary | ICD-10-CM | POA: Diagnosis not present

## 2016-08-15 DIAGNOSIS — I5032 Chronic diastolic (congestive) heart failure: Secondary | ICD-10-CM | POA: Diagnosis not present

## 2016-08-15 DIAGNOSIS — J449 Chronic obstructive pulmonary disease, unspecified: Secondary | ICD-10-CM | POA: Diagnosis not present

## 2016-08-15 DIAGNOSIS — E1122 Type 2 diabetes mellitus with diabetic chronic kidney disease: Secondary | ICD-10-CM | POA: Diagnosis not present

## 2016-08-15 DIAGNOSIS — J45909 Unspecified asthma, uncomplicated: Secondary | ICD-10-CM | POA: Diagnosis not present

## 2016-08-16 DIAGNOSIS — Z6826 Body mass index (BMI) 26.0-26.9, adult: Secondary | ICD-10-CM | POA: Diagnosis not present

## 2016-08-16 DIAGNOSIS — M189 Osteoarthritis of first carpometacarpal joint, unspecified: Secondary | ICD-10-CM | POA: Diagnosis not present

## 2016-08-16 DIAGNOSIS — E039 Hypothyroidism, unspecified: Secondary | ICD-10-CM | POA: Diagnosis not present

## 2016-08-16 DIAGNOSIS — I10 Essential (primary) hypertension: Secondary | ICD-10-CM | POA: Diagnosis not present

## 2016-08-16 DIAGNOSIS — N183 Chronic kidney disease, stage 3 (moderate): Secondary | ICD-10-CM | POA: Diagnosis not present

## 2016-08-16 DIAGNOSIS — I701 Atherosclerosis of renal artery: Secondary | ICD-10-CM | POA: Diagnosis not present

## 2016-08-16 DIAGNOSIS — E663 Overweight: Secondary | ICD-10-CM | POA: Diagnosis not present

## 2016-08-16 DIAGNOSIS — E1140 Type 2 diabetes mellitus with diabetic neuropathy, unspecified: Secondary | ICD-10-CM | POA: Diagnosis not present

## 2016-08-16 DIAGNOSIS — E119 Type 2 diabetes mellitus without complications: Secondary | ICD-10-CM | POA: Diagnosis not present

## 2016-08-19 DIAGNOSIS — J449 Chronic obstructive pulmonary disease, unspecified: Secondary | ICD-10-CM | POA: Diagnosis not present

## 2016-08-19 DIAGNOSIS — J45909 Unspecified asthma, uncomplicated: Secondary | ICD-10-CM | POA: Diagnosis not present

## 2016-08-19 DIAGNOSIS — I5032 Chronic diastolic (congestive) heart failure: Secondary | ICD-10-CM | POA: Diagnosis not present

## 2016-08-19 DIAGNOSIS — E1122 Type 2 diabetes mellitus with diabetic chronic kidney disease: Secondary | ICD-10-CM | POA: Diagnosis not present

## 2016-08-19 DIAGNOSIS — I130 Hypertensive heart and chronic kidney disease with heart failure and stage 1 through stage 4 chronic kidney disease, or unspecified chronic kidney disease: Secondary | ICD-10-CM | POA: Diagnosis not present

## 2016-08-19 DIAGNOSIS — N182 Chronic kidney disease, stage 2 (mild): Secondary | ICD-10-CM | POA: Diagnosis not present

## 2016-08-21 DIAGNOSIS — M47816 Spondylosis without myelopathy or radiculopathy, lumbar region: Secondary | ICD-10-CM | POA: Diagnosis not present

## 2016-08-21 DIAGNOSIS — M5116 Intervertebral disc disorders with radiculopathy, lumbar region: Secondary | ICD-10-CM | POA: Diagnosis not present

## 2016-08-21 DIAGNOSIS — M48061 Spinal stenosis, lumbar region without neurogenic claudication: Secondary | ICD-10-CM | POA: Diagnosis not present

## 2016-08-22 DIAGNOSIS — I5032 Chronic diastolic (congestive) heart failure: Secondary | ICD-10-CM | POA: Diagnosis not present

## 2016-08-22 DIAGNOSIS — E1122 Type 2 diabetes mellitus with diabetic chronic kidney disease: Secondary | ICD-10-CM | POA: Diagnosis not present

## 2016-08-22 DIAGNOSIS — I130 Hypertensive heart and chronic kidney disease with heart failure and stage 1 through stage 4 chronic kidney disease, or unspecified chronic kidney disease: Secondary | ICD-10-CM | POA: Diagnosis not present

## 2016-08-22 DIAGNOSIS — J449 Chronic obstructive pulmonary disease, unspecified: Secondary | ICD-10-CM | POA: Diagnosis not present

## 2016-08-22 DIAGNOSIS — N182 Chronic kidney disease, stage 2 (mild): Secondary | ICD-10-CM | POA: Diagnosis not present

## 2016-08-22 DIAGNOSIS — J45909 Unspecified asthma, uncomplicated: Secondary | ICD-10-CM | POA: Diagnosis not present

## 2016-08-28 DIAGNOSIS — J45909 Unspecified asthma, uncomplicated: Secondary | ICD-10-CM | POA: Diagnosis not present

## 2016-08-28 DIAGNOSIS — N182 Chronic kidney disease, stage 2 (mild): Secondary | ICD-10-CM | POA: Diagnosis not present

## 2016-08-28 DIAGNOSIS — J449 Chronic obstructive pulmonary disease, unspecified: Secondary | ICD-10-CM | POA: Diagnosis not present

## 2016-08-28 DIAGNOSIS — E1122 Type 2 diabetes mellitus with diabetic chronic kidney disease: Secondary | ICD-10-CM | POA: Diagnosis not present

## 2016-08-28 DIAGNOSIS — I5032 Chronic diastolic (congestive) heart failure: Secondary | ICD-10-CM | POA: Diagnosis not present

## 2016-08-28 DIAGNOSIS — I130 Hypertensive heart and chronic kidney disease with heart failure and stage 1 through stage 4 chronic kidney disease, or unspecified chronic kidney disease: Secondary | ICD-10-CM | POA: Diagnosis not present

## 2016-09-05 DIAGNOSIS — N182 Chronic kidney disease, stage 2 (mild): Secondary | ICD-10-CM | POA: Diagnosis not present

## 2016-09-05 DIAGNOSIS — J45909 Unspecified asthma, uncomplicated: Secondary | ICD-10-CM | POA: Diagnosis not present

## 2016-09-05 DIAGNOSIS — I5032 Chronic diastolic (congestive) heart failure: Secondary | ICD-10-CM | POA: Diagnosis not present

## 2016-09-05 DIAGNOSIS — E1122 Type 2 diabetes mellitus with diabetic chronic kidney disease: Secondary | ICD-10-CM | POA: Diagnosis not present

## 2016-09-05 DIAGNOSIS — I130 Hypertensive heart and chronic kidney disease with heart failure and stage 1 through stage 4 chronic kidney disease, or unspecified chronic kidney disease: Secondary | ICD-10-CM | POA: Diagnosis not present

## 2016-09-05 DIAGNOSIS — J449 Chronic obstructive pulmonary disease, unspecified: Secondary | ICD-10-CM | POA: Diagnosis not present

## 2016-09-10 ENCOUNTER — Ambulatory Visit: Payer: Medicare Other | Admitting: Emergency Medicine

## 2016-09-11 ENCOUNTER — Ambulatory Visit (INDEPENDENT_AMBULATORY_CARE_PROVIDER_SITE_OTHER): Payer: Medicare Other | Admitting: Gastroenterology

## 2016-09-11 ENCOUNTER — Encounter: Payer: Self-pay | Admitting: Gastroenterology

## 2016-09-11 VITALS — BP 166/64 | HR 76 | Ht 66.0 in | Wt 210.6 lb

## 2016-09-11 DIAGNOSIS — R0789 Other chest pain: Secondary | ICD-10-CM

## 2016-09-11 DIAGNOSIS — R490 Dysphonia: Secondary | ICD-10-CM | POA: Diagnosis not present

## 2016-09-11 MED ORDER — PANTOPRAZOLE SODIUM 40 MG PO TBEC: 40.0000 mg | DELAYED_RELEASE_TABLET | Freq: Every day | ORAL | 1 refills | Status: DC

## 2016-09-11 NOTE — Progress Notes (Signed)
[facility] Consult Note:  History: Sandra Cuevas 09/11/2016  Referring physician: Baltazar Apo, MD (Pulmonary)  Reason for consult/chief complaint: Chest Pain (chest pain radiating to back; hospitalized x 6 days and was told non-cardiac; pulmonologist gave pt pantoprazole bid which has helped; pain starts midsternally and radiates to left side then into back); Gastroesophageal Reflux (voice is raspy; no feelings of indigestion or acid in throat); and belching (+ excessive belching and bloating)   Subjective  HPI:  This is an 83 year old woman referred by her pulmonologist noted above for chronic chest pain. Records indicate that she has had about 2 months of recurrent chest pain resulting in multiple ED visits and a several day hospitalization in early last month. Really had a negative workup for acute cardiac causes of this chest pain. She has stable chronic cardiac disease with diastolic heart function noted in her January 2018 cardiology office note. Because she had a chronic hoarseness (for which she has apparently seen ENT in the past) and some belching, she was referred to GI as reflux for possible causes of chest pain. She is with her daughter today, and they confirmed that clear was having severe constant unrelenting chest pain radiating into the back. It was so bad she required morphine for relief while in the hospital. It looks like from the discharge summary that she was prescribed gabapentin and Percocet. She is no longer on the former, she takes the latter occasionally. About a month ago Dr. Lamonte Sakai put her on twice daily Protonix, and 2 weeks ago she went on to once daily. Her chest pain has greatly decreased and she feels that this medicine has helped a lot. 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 She says she received a "shot my back" about 2 weeks ago, "but it did not  help". She denies frank dysphagia, but says sometimes she will get "strangled" on saliva. Her hoarseness worsens the more she talks.  ROS:  Review of Systems  Constitutional: Negative for appetite change and unexpected weight change.  HENT: Negative for mouth sores and voice change.   Eyes: Negative for pain and redness.  Respiratory: Negative for cough and shortness of breath.   Cardiovascular: Negative for chest pain and palpitations.  Genitourinary: Negative for dysuria and hematuria.  Musculoskeletal: Positive for arthralgias and back pain. Negative for myalgias.  Skin: Negative for pallor and rash.  Neurological: Negative for weakness and headaches.  Hematological: Negative for adenopathy.   Chest pain as noted above  Past Medical History: Past Medical History:  Diagnosis Date  . Arthritis   . Asthma   . Atypical chest pain   . 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 Diabetes mellitus   . GERD (gastroesophageal reflux disease)   . History of cardiovascular stress test 06/01/2010   EF 71% - no evidence of ischemia, normal left ventricular systolic function  . History of hysterectomy 1965  . Hyperlipidemia   . Hypertension   . Hypothyroidism   . Shortness of breath    on exertion  . Tubular adenoma of colon      Past Surgical History: Past Surgical History:  Procedure Laterality Date  . ABDOMINAL HYSTERECTOMY    . CARDIAC SURGERY     catherization  . HEMORRHOID SURGERY    . TOTAL SHOULDER REPLACEMENT Right      Family History:  Family History  Problem Relation Age of Onset  . Heart disease Father        heart problems  . Kidney failure Mother   . Diabetes Mother   . Leukemia Brother   . Diabetes Brother   . Colon cancer Sister   . Leukemia Brother   . Diabetes Brother   . Pancreatic cancer Brother   . Bone cancer Sister     Social History: Social History   Social History  . Marital status: Widowed    Spouse name: N/A  . Number  of children: N/A  . Years of education: N/A   Social History Main Topics  . Smoking status: Never Smoker  . Smokeless tobacco: Never Used  . Alcohol use No  . Drug use: No  . Sexual activity: No   Other Topics Concern  . None   Social History Narrative  . None    Allergies: No Known Allergies  Outpatient Meds: Current Outpatient Prescriptions  Medication Sig Dispense Refill  . acetaminophen (TYLENOL) 500 MG tablet Take 2 tablets (1,000 mg total) by mouth every 6 (six) hours as needed. 30 tablet 0  . amLODipine (NORVASC) 10 MG tablet Take 1 tablet (10 mg total) by mouth daily. 30 tablet 0  . atorvastatin (LIPITOR) 10 MG tablet Take 10 mg by mouth Daily.     . budesonide-formoterol (SYMBICORT) 160-4.5 MCG/ACT inhaler Inhale 2 puffs into the lungs 2 (two) times daily. 1 Inhaler 12  . docusate sodium (COLACE) 100 MG capsule Take 200 mg by mouth at bedtime.    . gabapentin (NEURONTIN) 300 MG capsule Take 300 mg by mouth 2 (two) times daily.     Marland Kitchen levothyroxine (SYNTHROID, LEVOTHROID) 50 MCG tablet Take 50 mcg by mouth daily.      Marland Kitchen losartan (COZAAR) 50 MG tablet Take 50 mg by mouth daily.     . metFORMIN (GLUCOPHAGE) 500 MG tablet Take 1 tablet (500 mg total) by mouth 2 (two) times daily with a meal.    . oxyCODONE-acetaminophen (PERCOCET/ROXICET) 5-325 MG tablet Take 1 tablet by mouth every 4 (four) hours as needed for moderate pain or severe pain. 30 tablet 0  . pantoprazole (PROTONIX) 40 MG tablet Take 1 tablet (40 mg total) by mouth daily. Take 40 mg BID x 2weeks,then 1 daily there after 30 tablet 1  . polyethylene glycol (MIRALAX / GLYCOLAX) packet Take 17 g by mouth daily. 14 each 0  . Polyvinyl Alcohol-Povidone (MURINE TEARS FOR DRY EYES) 5-6 MG/ML SOLN Place 1 drop into both eyes daily as needed (Dry Eyes).    . potassium chloride (MICRO-K) 10 MEQ CR capsule Take 10 mEq by mouth daily.     Marland Kitchen zolpidem (AMBIEN) 5 MG tablet Take 5 mg by mouth at bedtime.     Marland Kitchen albuterol  (PROVENTIL) (5 MG/ML) 0.5% nebulizer solution Take 0.5 mLs (2.5 mg total) by nebulization every 4 (four) hours as needed for wheezing or shortness of breath. (Patient not taking: Reported on 09/11/2016) 20 mL 12   No current facility-administered medications for this visit.       ___________________________________________________________________ Objective   Exam:  BP (!) 166/64   Pulse 76   Ht 5\' 6"  (1.676 m)   Wt 210 lb 9.6 oz (95.5 kg)   BMI 33.99 kg/m    General: this is a(n) Pleasant, well-appearing elderly woman with soft and slightly raspy vocal quality   Eyes: sclera anicteric, no redness  ENT: oral mucosa moist without lesions, no  cervical or supraclavicular lymphadenopathy, good dentition  CV: RRR without murmur, S1/S2, no JVD, no peripheral edema. No chest wall tenderness, though this was reported during her last pulmonary clinic note a month ago.  She is in a lot of pain when she lays back for an exam.  Resp: clear to auscultation bilaterally, normal RR and effort noted  GI: soft, no tenderness, with active bowel sounds. No guarding or palpable organomegaly noted.  Skin; warm and dry, no rash or jaundice noted  Neuro: awake, alert and oriented x 3. Normal gross motor function and fluent speech  Spine x-ray, CT angiogram of the chest and cardiac testing reviewed.  Barium esophagram last month shows mild dysmotility with no stricture  Assessment: Encounter Diagnoses  Name Primary?  . Atypical chest pain Yes  . Hoarseness     I think she may have mild reflux, but I doubt very much it can completely explain the severity: Degree in character the chest pain she was describing. I think it is primarily arthritic/musculoskeletal. I think the yield of an upper endoscopy is likely to be low at this point, and I have not scheduled at. Plan:  Protonix 40 mg once daily for the next month, then decrease to every other day for a month and see me in 2 months.  If chest  pain seems to be worsening, we can revisit the need for an EGD. I still think she is likely need further attention to her arthritic pain control.  Thank you for the courtesy of this consult.  Please call me with any questions or concerns.  Nelida Meuse III  CC: Sharilyn Sites, MD  Baltazar Apo, MD

## 2016-09-11 NOTE — Patient Instructions (Signed)
If you are age 81 or older, your body mass index should be between 23-30. Your Body mass index is 33.99 kg/m. If this is out of the aforementioned range listed, please consider follow up with your Primary Care Provider.  If you are age 60 or younger, your body mass index should be between 19-25. Your Body mass index is 33.99 kg/m. If this is out of the aformentioned range listed, please consider follow up with your Primary Care Provider.   We have refilled your Protonix. Please take one a day for a month,then take one every other day.  Follow up in 2 months.  Thank you for choosing [facility]  Dr Wilfrid Lund III

## 2016-09-13 DIAGNOSIS — J449 Chronic obstructive pulmonary disease, unspecified: Secondary | ICD-10-CM | POA: Diagnosis not present

## 2016-09-13 DIAGNOSIS — E1122 Type 2 diabetes mellitus with diabetic chronic kidney disease: Secondary | ICD-10-CM | POA: Diagnosis not present

## 2016-09-13 DIAGNOSIS — I130 Hypertensive heart and chronic kidney disease with heart failure and stage 1 through stage 4 chronic kidney disease, or unspecified chronic kidney disease: Secondary | ICD-10-CM | POA: Diagnosis not present

## 2016-09-13 DIAGNOSIS — N182 Chronic kidney disease, stage 2 (mild): Secondary | ICD-10-CM | POA: Diagnosis not present

## 2016-09-13 DIAGNOSIS — J45909 Unspecified asthma, uncomplicated: Secondary | ICD-10-CM | POA: Diagnosis not present

## 2016-09-13 DIAGNOSIS — I5032 Chronic diastolic (congestive) heart failure: Secondary | ICD-10-CM | POA: Diagnosis not present

## 2016-09-19 DIAGNOSIS — I5032 Chronic diastolic (congestive) heart failure: Secondary | ICD-10-CM | POA: Diagnosis not present

## 2016-09-19 DIAGNOSIS — I130 Hypertensive heart and chronic kidney disease with heart failure and stage 1 through stage 4 chronic kidney disease, or unspecified chronic kidney disease: Secondary | ICD-10-CM | POA: Diagnosis not present

## 2016-09-19 DIAGNOSIS — J45909 Unspecified asthma, uncomplicated: Secondary | ICD-10-CM | POA: Diagnosis not present

## 2016-09-19 DIAGNOSIS — J449 Chronic obstructive pulmonary disease, unspecified: Secondary | ICD-10-CM | POA: Diagnosis not present

## 2016-09-19 DIAGNOSIS — N182 Chronic kidney disease, stage 2 (mild): Secondary | ICD-10-CM | POA: Diagnosis not present

## 2016-09-19 DIAGNOSIS — E1122 Type 2 diabetes mellitus with diabetic chronic kidney disease: Secondary | ICD-10-CM | POA: Diagnosis not present

## 2016-09-23 DIAGNOSIS — E11319 Type 2 diabetes mellitus with unspecified diabetic retinopathy without macular edema: Secondary | ICD-10-CM | POA: Diagnosis not present

## 2016-09-23 DIAGNOSIS — E1140 Type 2 diabetes mellitus with diabetic neuropathy, unspecified: Secondary | ICD-10-CM | POA: Diagnosis not present

## 2016-09-23 DIAGNOSIS — H35363 Drusen (degenerative) of macula, bilateral: Secondary | ICD-10-CM | POA: Diagnosis not present

## 2016-10-14 ENCOUNTER — Encounter: Payer: Self-pay | Admitting: Emergency Medicine

## 2016-10-14 ENCOUNTER — Ambulatory Visit (INDEPENDENT_AMBULATORY_CARE_PROVIDER_SITE_OTHER): Payer: Medicare Other | Admitting: Emergency Medicine

## 2016-10-14 DIAGNOSIS — J019 Acute sinusitis, unspecified: Secondary | ICD-10-CM | POA: Insufficient documentation

## 2016-10-14 DIAGNOSIS — K219 Gastro-esophageal reflux disease without esophagitis: Secondary | ICD-10-CM | POA: Diagnosis not present

## 2016-10-14 MED ORDER — DOXYCYCLINE HYCLATE 100 MG PO TABS: 100.0000 mg | ORAL_TABLET | Freq: Two times a day (BID) | ORAL | 0 refills | Status: DC

## 2016-10-14 MED ORDER — PREDNISONE 10 MG PO TABS: ORAL_TABLET | ORAL | 0 refills | Status: DC

## 2016-10-14 NOTE — Assessment & Plan Note (Signed)
Treat with doxycycline. I suspect she'll also benefit from prednisone taper for her upper airway irritation.

## 2016-10-14 NOTE — Assessment & Plan Note (Addendum)
No wheezing currently - her sx are most consistent with UA irritation and sinusitis. Will treat with pred + doxy. Continue Symbicort as ordered.

## 2016-10-14 NOTE — Assessment & Plan Note (Signed)
Continue current pantoprazole dosing

## 2016-10-14 NOTE — Patient Instructions (Signed)
Please continue to use your tylenol and OTC decongestants as you have been doing Please take doxycycline as directed until completely gone.  Take prednisone as directed until completely gone.  Continue pantoprazole as you are taking it.  Continue your Symbicort as you have been taking it.  Follow with Dr Lamonte Sakai in 4 months or sooner if you have any problems.

## 2016-10-14 NOTE — Progress Notes (Signed)
Subjective:    Patient ID: Sandra Cuevas, female    DOB: [birth year], 83 y.o.   MRN: [id number]  HPI 83 year old woman, never smoker, history of hypertension, hyperlipidemia, hypothyroidism, COPD on the basis of chronic asthma. The dx of asthma was first was made in late teens. She was treated initially but she no longer needed meds when she stopped working in [employer].  Pulmonary function testing performed 04/20/13 was reviewed by me today. This shows moderate obstruction without a bronchodilator response. She has been managed on Symbicort twice a day and albuterol when necessary. She is on Dexilant bid, is not using any allergy regimen at this time. She has a chronic hoarse voice, has undergone laryngoscopy before. She is active, is able to exert herself. She has nebs and albuterol HFA to use prn but rarely needs it. No cough. Rare wheeze. No CP.    ROV 09/05/15 -- follow-up visit for COPD in the setting of chronic fixed asthma.also with a chronic hoarse voice. She has seen ENT before - no pathology seen. She is on dexilant. No flares. She has albuterol available to use prn, has not needed it. On scheduled Symbicort. She is having a lot of back trouble that is limiting her activity, not her breathing. No real cough. Her hoarse voice is stable.   Acute office visit 08/08/16 -- patient has a history of COPD / fixed asthma, chronic hoarseness, hiatal hernia and GERD. She presents today for an acute visit to evaluate chest discomfort. She reports that she has been having mid chest pain to her back, beginning in late March. It woke her from sleep. Seems to be present to some degree at all times. She had a CT angiogram 07/14/16 that showed no dissection, no PE, no infiltrates, a 85mm RLL nodule. ECG's were reassuring. She is on omeprazole bid. She has been treated w narcotics, most recently steroids - no relief. She denies dyspnea.   ROV 10/14/16 -- 83 y.o. -- This is a follow-up visit for history of COPD/fixed asthma,  chronic hoarseness, hiatal hernia with GERD. She also has a 6 mm right lower lobe nodule noted on CT scan 07/14/16. Was felt that 81mm RLL nodule. and I changed her omeprazole to pantoprazole in May. Her chest discomfort and cough got better. She began to have scratchy throat 2 weeks ago, hoarse voice. Started to cough and have HA and nasal / throat drainage. She is on Symbicort. Coughing up yellow mucous. No fevers. She has been using tylenol.    Review of Systems As per HPI  Past Medical History:  Diagnosis Date  . Arthritis   . Asthma   . Atypical chest pain   . 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 Diabetes mellitus   . GERD (gastroesophageal reflux disease)   . History of cardiovascular stress test 06/01/2010   EF 71% - no evidence of ischemia, normal left ventricular systolic function  . History of hysterectomy 1965  . Hyperlipidemia   . Hypertension   . Hypothyroidism   . Shortness of breath    on exertion  . Tubular adenoma of colon      Family History  Problem Relation Age of Onset  . Heart disease Father        heart problems  . Kidney failure Mother   . Diabetes Mother   . Leukemia Brother   . Diabetes Brother   . Colon cancer Sister   .  Leukemia Brother   . Diabetes Brother   . Pancreatic cancer Brother   . Bone cancer Sister      Social History   Social History  . Marital status: Widowed    Spouse name: N/A  . Number of children: N/A  . Years of education: N/A   Occupational History  . Not on file.   Social History Main Topics  . Smoking status: Never Smoker  . Smokeless tobacco: Never Used  . Alcohol use No  . Drug use: No  . Sexual activity: No   Other Topics Concern  . Not on file   Social History Narrative  . No narrative on file    Used to work at [employer], made cigarettes.  Worked in a [employer] also 13 yrs  No Known Allergies   Outpatient Medications Prior to Visit  Medication Sig Dispense Refill  .  acetaminophen (TYLENOL) 500 MG tablet Take 2 tablets (1,000 mg total) by mouth every 6 (six) hours as needed. 30 tablet 0  . albuterol (PROVENTIL) (5 MG/ML) 0.5% nebulizer solution Take 0.5 mLs (2.5 mg total) by nebulization every 4 (four) hours as needed for wheezing or shortness of breath. 20 mL 12  . amLODipine (NORVASC) 10 MG tablet Take 1 tablet (10 mg total) by mouth daily. 30 tablet 0  . atorvastatin (LIPITOR) 10 MG tablet Take 10 mg by mouth Daily.     . budesonide-formoterol (SYMBICORT) 160-4.5 MCG/ACT inhaler Inhale 2 puffs into the lungs 2 (two) times daily. 1 Inhaler 12  . docusate sodium (COLACE) 100 MG capsule Take 200 mg by mouth at bedtime.    . gabapentin (NEURONTIN) 300 MG capsule Take 300 mg by mouth 2 (two) times daily.     . pantoprazole (PROTONIX) 40 MG tablet Take 1 tablet (40 mg total) by mouth daily. Take 40 mg BID x 2weeks,then 1 daily there after 30 tablet 1  . polyethylene glycol (MIRALAX / GLYCOLAX) packet Take 17 g by mouth daily. 14 each 0  . Polyvinyl Alcohol-Povidone (MURINE TEARS FOR DRY EYES) 5-6 MG/ML SOLN Place 1 drop into both eyes daily as needed (Dry Eyes).    . potassium chloride (MICRO-K) 10 MEQ CR capsule Take 10 mEq by mouth daily.     Marland Kitchen zolpidem (AMBIEN) 5 MG tablet Take 5 mg by mouth at bedtime.     Marland Kitchen levothyroxine (SYNTHROID, LEVOTHROID) 50 MCG tablet Take 50 mcg by mouth daily.      Marland Kitchen losartan (COZAAR) 50 MG tablet Take 50 mg by mouth daily.     . metFORMIN (GLUCOPHAGE) 500 MG tablet Take 1 tablet (500 mg total) by mouth 2 (two) times daily with a meal. (Patient not taking: Reported on 10/14/2016)    . oxyCODONE-acetaminophen (PERCOCET/ROXICET) 5-325 MG tablet Take 1 tablet by mouth every 4 (four) hours as needed for moderate pain or severe pain. (Patient not taking: Reported on 10/14/2016) 30 tablet 0   No facility-administered medications prior to visit.          Objective:   Physical Exam Vitals:   10/14/16 1557  BP: 118/78  Pulse: 77  SpO2:  95%  Weight: 209 lb 6.4 oz (95 kg)  Height: 5\' 6"  (1.676 m)   Gen: Pleasant, well-nourished, in no distress,  normal affect  ENT: No lesions,  mouth clear,  oropharynx clear, no postnasal drip, hoarse voice  Neck: No JVD, no TMG, no carotid bruits  Lungs: No use of accessory muscles, clear without rales or rhonchi  Cardiovascular: RRR, heart sounds normal, no murmur or gallops, no peripheral edema  Musculoskeletal: No deformities  Neuro: alert, non focal  Skin: Warm, no lesions or rashes      Assessment & Plan:  Chronic obstructive airway disease with asthma No wheezing currently - her sx are most consistent with UA irritation and sinusitis. Will treat with pred + doxy. Continue Symbicort as ordered.  Acute sinusitis Treat with doxycycline. I suspect she'll also benefit from prednisone taper for her upper airway irritation.  GERD (gastroesophageal reflux disease) Continue current pantoprazole dosing  Baltazar Apo, MD, PhD 10/14/2016, 4:10 PM [facility] [phone] or if no answer [phone]

## 2016-10-21 ENCOUNTER — Telehealth: Payer: Self-pay

## 2016-10-21 MED ORDER — PANTOPRAZOLE SODIUM 40 MG PO TBEC: 40.0000 mg | DELAYED_RELEASE_TABLET | Freq: Every day | ORAL | 1 refills | Status: DC

## 2016-10-21 NOTE — Telephone Encounter (Signed)
Refilled as directed.  

## 2016-10-21 NOTE — Telephone Encounter (Signed)
Yes, that is fine. It can be refilled at current dose for once daily and I will discuss it with her at the follow up visit.

## 2016-10-21 NOTE — Telephone Encounter (Signed)
Refill request for Protonix 40 mg once a day. Pt last seen on 10-14-2016. At the visit she was advised to drop protonix to every other day. Pt contacted for a follow up on medication management. She has been taking once a day and not decreased to every other day. She states at this time she wants to continue her current dose and discuss at her follow up office visit scheduled for 11-11-2016.

## 2016-10-28 ENCOUNTER — Ambulatory Visit (HOSPITAL_COMMUNITY)
Admission: RE | Admit: 2016-10-28 | Discharge: 2016-10-28 | Disposition: A | Discharge status: Home / Self Care | Payer: Medicare Other | Source: Ambulatory Visit | Attending: Family Medicine | Admitting: Family Medicine

## 2016-10-28 ENCOUNTER — Other Ambulatory Visit (HOSPITAL_COMMUNITY): Payer: Self-pay | Admitting: Family Medicine

## 2016-10-28 DIAGNOSIS — E663 Overweight: Secondary | ICD-10-CM | POA: Diagnosis not present

## 2016-10-28 DIAGNOSIS — M25552 Pain in left hip: Secondary | ICD-10-CM | POA: Insufficient documentation

## 2016-10-28 DIAGNOSIS — E2839 Other primary ovarian failure: Secondary | ICD-10-CM

## 2016-10-28 DIAGNOSIS — Z780 Asymptomatic menopausal state: Secondary | ICD-10-CM | POA: Diagnosis not present

## 2016-10-28 DIAGNOSIS — M85851 Other specified disorders of bone density and structure, right thigh: Secondary | ICD-10-CM | POA: Diagnosis not present

## 2016-10-28 DIAGNOSIS — Z6828 Body mass index (BMI) 28.0-28.9, adult: Secondary | ICD-10-CM | POA: Diagnosis not present

## 2016-11-11 ENCOUNTER — Ambulatory Visit: Payer: Medicare Other | Admitting: Gastroenterology

## 2016-11-27 ENCOUNTER — Encounter (INDEPENDENT_AMBULATORY_CARE_PROVIDER_SITE_OTHER): Payer: Medicare Other | Admitting: Ophthalmology

## 2016-11-27 DIAGNOSIS — H353113 Nonexudative age-related macular degeneration, right eye, advanced atrophic without subfoveal involvement: Secondary | ICD-10-CM | POA: Diagnosis not present

## 2016-11-27 DIAGNOSIS — H353221 Exudative age-related macular degeneration, left eye, with active choroidal neovascularization: Secondary | ICD-10-CM

## 2016-11-27 DIAGNOSIS — I10 Essential (primary) hypertension: Secondary | ICD-10-CM

## 2016-11-27 DIAGNOSIS — H43813 Vitreous degeneration, bilateral: Secondary | ICD-10-CM

## 2016-11-27 DIAGNOSIS — H35033 Hypertensive retinopathy, bilateral: Secondary | ICD-10-CM

## 2017-01-03 ENCOUNTER — Other Ambulatory Visit (HOSPITAL_COMMUNITY): Payer: Self-pay | Admitting: Family Medicine

## 2017-01-03 DIAGNOSIS — Z1231 Encounter for screening mammogram for malignant neoplasm of breast: Secondary | ICD-10-CM

## 2017-01-03 NOTE — Telephone Encounter (Signed)
I am willing to continue prescribing it for 2 more months. If an office follow up with me is difficult to manage because of her mobility issues, then primary care can also manage this medication.

## 2017-01-03 NOTE — Telephone Encounter (Signed)
additional refill request for pantoprazole 40mg  qd. Pt cancelled follow up on in 11-2016. Please advise.

## 2017-01-06 ENCOUNTER — Ambulatory Visit (HOSPITAL_COMMUNITY)
Admission: RE | Admit: 2017-01-06 | Discharge: 2017-01-06 | Disposition: A | Discharge status: Home / Self Care | Payer: Medicare Other | Source: Ambulatory Visit | Attending: Family Medicine | Admitting: Family Medicine

## 2017-01-06 DIAGNOSIS — Z1231 Encounter for screening mammogram for malignant neoplasm of breast: Secondary | ICD-10-CM | POA: Diagnosis not present

## 2017-01-07 MED ORDER — PANTOPRAZOLE SODIUM 40 MG PO TBEC: 40.0000 mg | DELAYED_RELEASE_TABLET | Freq: Every day | ORAL | 2 refills | Status: DC

## 2017-01-07 NOTE — Addendum Note (Signed)
Addended by: Elias Else on: 01/07/2017 09:32 AM   Modules accepted: Orders

## 2017-01-07 NOTE — Telephone Encounter (Signed)
Pt has scheduled a follow up for 02-18-2017 @ 130pm. Refills given as directed.

## 2017-01-07 NOTE — Telephone Encounter (Signed)
Left message to return call 

## 2017-02-10 DIAGNOSIS — M25562 Pain in left knee: Secondary | ICD-10-CM | POA: Diagnosis not present

## 2017-02-18 ENCOUNTER — Encounter: Payer: Self-pay | Admitting: Gastroenterology

## 2017-02-18 ENCOUNTER — Ambulatory Visit: Payer: Medicare Other | Admitting: Emergency Medicine

## 2017-02-18 ENCOUNTER — Ambulatory Visit (INDEPENDENT_AMBULATORY_CARE_PROVIDER_SITE_OTHER): Payer: Medicare Other | Admitting: Gastroenterology

## 2017-02-18 VITALS — BP 126/60 | HR 64 | Ht 66.0 in | Wt 214.0 lb

## 2017-02-18 DIAGNOSIS — R0789 Other chest pain: Secondary | ICD-10-CM | POA: Diagnosis not present

## 2017-02-18 NOTE — Patient Instructions (Addendum)
If you are age 81 or older, your body mass index should be between 23-30. Your Body mass index is 34.54 kg/m. If this is out of the aforementioned range listed, please consider follow up with your Primary Care Provider.  If you are age 55 or younger, your body mass index should be between 19-25. Your Body mass index is 34.54 kg/m. If this is out of the aformentioned range listed, please consider follow up with your Primary Care Provider.   Please check your medicines at home.  If you are still taking Pantoprazole, please decrease it to every other day for a week and then stop taking it.  Call me if needed.  Thank you for choosing [facility]  Dr Wilfrid Lund III

## 2017-02-18 NOTE — Progress Notes (Signed)
[facility] Progress Note  Chief Complaint: Noncardiac chest pain  Subjective  History:  This is an 83 year old woman I saw in June of this year. She was sent to see me for concern of GERD as possible cause for chest pain. I felt it was most likely radiated pain from a recently diagnosed thoracic spine fracture. She has cut back her PPI to probably once a day, but she isn't sure. I had originally asked her to have tapered it down to every other day by now. She never had any heartburn, and still does not have any. She denies dysphagia, but still curiously describes as sometimes she is "choked on her own saliva". She has no trouble swallowing solid food or liquids. Her chest pain resolved.  ROS: She denies dyspnea or dysuria  The patient's Past Medical, Family and Social History were reviewed and are on file in the EMR.  Objective:  Med list reviewed  Current Outpatient Medications:  .  acetaminophen (TYLENOL) 500 MG tablet, Take 2 tablets (1,000 mg total) by mouth every 6 (six) hours as needed., Disp: 30 tablet, Rfl: 0 .  albuterol (PROVENTIL) (5 MG/ML) 0.5% nebulizer solution, Take 0.5 mLs (2.5 mg total) by nebulization every 4 (four) hours as needed for wheezing or shortness of breath., Disp: 20 mL, Rfl: 12 .  amLODipine (NORVASC) 10 MG tablet, Take 1 tablet (10 mg total) by mouth daily., Disp: 30 tablet, Rfl: 0 .  atorvastatin (LIPITOR) 10 MG tablet, Take 10 mg by mouth Daily. , Disp: , Rfl:  .  budesonide-formoterol (SYMBICORT) 160-4.5 MCG/ACT inhaler, Inhale 2 puffs into the lungs 2 (two) times daily., Disp: 1 Inhaler, Rfl: 12 .  docusate sodium (COLACE) 100 MG capsule, Take 200 mg by mouth at bedtime., Disp: , Rfl:  .  gabapentin (NEURONTIN) 300 MG capsule, Take 300 mg by mouth 2 (two) times daily. , Disp: , Rfl:  .  levothyroxine (SYNTHROID, LEVOTHROID) 50 MCG tablet, Take 50 mcg by mouth daily.  , Disp: , Rfl:  .  losartan (COZAAR) 50 MG tablet, Take 50 mg by mouth  daily. , Disp: , Rfl:  .  metFORMIN (GLUCOPHAGE) 500 MG tablet, Take 1 tablet (500 mg total) by mouth 2 (two) times daily with a meal., Disp: , Rfl:  .  oxyCODONE-acetaminophen (PERCOCET/ROXICET) 5-325 MG tablet, Take 1 tablet by mouth every 4 (four) hours as needed for moderate pain or severe pain., Disp: 30 tablet, Rfl: 0 .  pantoprazole (PROTONIX) 40 MG tablet, Take 1 tablet (40 mg total) by mouth daily., Disp: 30 tablet, Rfl: 2 .  polyethylene glycol (MIRALAX / GLYCOLAX) packet, Take 17 g by mouth daily., Disp: 14 each, Rfl: 0 .  Polyvinyl Alcohol-Povidone (MURINE TEARS FOR DRY EYES) 5-6 MG/ML SOLN, Place 1 drop into both eyes daily as needed (Dry Eyes)., Disp: , Rfl:  .  potassium chloride (MICRO-K) 10 MEQ CR capsule, Take 10 mEq by mouth daily. , Disp: , Rfl:  .  zolpidem (AMBIEN) 5 MG tablet, Take 5 mg by mouth at bedtime. , Disp: , Rfl:    Vital signs in last 24 hrs: Vitals:   02/18/17 1334  BP: 126/60  Pulse: 64    Physical Exam    HEENT: sclera anicteric, oral mucosa moist without lesions  Neck: supple, no thyromegaly, JVD or lymphadenopathy  Cardiac: RRR without murmurs, S1S2 heard, no peripheral edema  Pulm: clear to auscultation bilaterally, normal RR and effort noted  Abdomen: soft, no tenderness, with active  bowel sounds. No guarding or palpable hepatosplenomegaly.  Skin; warm and dry, no jaundice or rash Unlike before, she is able to lay flat on the exam table without back pain.   @ASSESSMENTPLANBEGIN @ Assessment: Encounter Diagnosis  Name Primary?  . Atypical chest pain Yes    Noncardiac chest pain that I still do not think is likely to have been caused by GERD.  Plan: We gave her written instructions to check her meds at home. If she still has the PPI, she will cut back every other day for a week and then stop. I will see her as needed.   Total time 15 minutes, over half spent in counseling and coordination of care.   Nelida Meuse III

## 2017-03-10 ENCOUNTER — Encounter: Payer: Self-pay | Admitting: Emergency Medicine

## 2017-03-10 ENCOUNTER — Ambulatory Visit (INDEPENDENT_AMBULATORY_CARE_PROVIDER_SITE_OTHER): Payer: Medicare Other | Admitting: Emergency Medicine

## 2017-03-10 DIAGNOSIS — Z23 Encounter for immunization: Secondary | ICD-10-CM

## 2017-03-10 DIAGNOSIS — R059 Cough, unspecified: Secondary | ICD-10-CM

## 2017-03-10 DIAGNOSIS — J449 Chronic obstructive pulmonary disease, unspecified: Secondary | ICD-10-CM | POA: Diagnosis not present

## 2017-03-10 DIAGNOSIS — R05 Cough: Secondary | ICD-10-CM | POA: Diagnosis not present

## 2017-03-10 DIAGNOSIS — R918 Other nonspecific abnormal finding of lung field: Secondary | ICD-10-CM | POA: Insufficient documentation

## 2017-03-10 DIAGNOSIS — R911 Solitary pulmonary nodule: Secondary | ICD-10-CM | POA: Diagnosis not present

## 2017-03-10 NOTE — Progress Notes (Signed)
Subjective:    Patient ID: Sandra Cuevas, female    DOB: 02/08/1929, 83 y.o.   MRN: [id number]  HPI 83 year old woman, never smoker, history of hypertension, hyperlipidemia, hypothyroidism, COPD on the basis of chronic asthma. The dx of asthma was first was made in late teens. She was treated initially but she no longer needed meds when she stopped working in [employer].  Pulmonary function testing performed 04/20/13 was reviewed by me today. This shows moderate obstruction without a bronchodilator response. She has been managed on Symbicort twice a day and albuterol when necessary. She is on Dexilant bid, is not using any allergy regimen at this time. She has a chronic hoarse voice, has undergone laryngoscopy before. She is active, is able to exert herself. She has nebs and albuterol HFA to use prn but rarely needs it. No cough. Rare wheeze. No CP.    ROV 09/05/15 -- follow-up visit for COPD in the setting of chronic fixed asthma.also with a chronic hoarse voice. She has seen ENT before - no pathology seen. She is on dexilant. No flares. She has albuterol available to use prn, has not needed it. On scheduled Symbicort. She is having a lot of back trouble that is limiting her activity, not her breathing. No real cough. Her hoarse voice is stable.   Acute office visit 08/08/16 -- patient has a history of COPD / fixed asthma, chronic hoarseness, hiatal hernia and GERD. She presents today for an acute visit to evaluate chest discomfort. She reports that she has been having mid chest pain to her back, beginning in late March. It woke her from sleep. Seems to be present to some degree at all times. She had a CT angiogram 07/14/16 that showed no dissection, no PE, no infiltrates, a 41mm RLL nodule. ECG's were reassuring. She is on omeprazole bid. She has been treated w narcotics, most recently steroids - no relief. She denies dyspnea.   ROV 10/14/16 -- This is a follow-up visit for history of COPD/fixed asthma,  chronic hoarseness, hiatal hernia with GERD. She also has a 6 mm right lower lobe nodule noted on CT scan 07/14/16. Was felt that he noise was related to GERD and I changed her omeprazole to pantoprazole in May. Her chest discomfort and cough got better. She began to have scratchy throat 2 weeks ago, hoarse voice. Started to cough and have HA and nasal / throat drainage. She is on Symbicort. Coughing up yellow mucous. No fevers. She has been using tylenol.   ROC 03/10/17 --83 year old woman with a history of fixed asthma and COPD (never smoker), GERD with a hiatal hernia, chronic hoarseness.  At her last visit in July we treated her for an acute bronchitis.  She has a 6 mm right lower lobe nodule on CT scan 07/14/16. She feels that her breathing is doing well. She remains on symbicort. She is on pantoprazole. Her voice stays hoarse. She has a lot of nasal congestion and drainage.    Review of Systems As per HPI      Objective:   Physical Exam Vitals:   03/10/17 0954  BP: (!) 180/62  Pulse: (!) 41  SpO2: 95%  Weight: 211 lb (95.7 kg)  Height: 5\' 6"  (1.676 m)   Gen: Pleasant, well-nourished, in no distress,  normal affect  ENT: No lesions,  mouth clear,  oropharynx clear, no postnasal drip, hoarse voice  Neck: No JVD, no stridor  Lungs: No use of accessory muscles, clear without rales  or rhonchi  Cardiovascular: RRR, heart sounds normal, no murmur or gallops, no peripheral edema  Musculoskeletal: No deformities  Neuro: alert, non focal  Skin: Warm, no lesions or rashes      Assessment & Plan:  Chronic obstructive airway disease with asthma Minimal Symptoms.  She is using Symbicort but usually only in the evening.  Discussed possibly stopping her scheduled bronchodilator.  She is open to this but does feel that it may help her some.  I will continue it for now.  Continue your Symbicort 2 puffs twice a day. Rinse and gargle after using.  Flu shot today.  Follow with Dr Lamonte Sakai in  April 2019, sooner if you have any problems.   Cough Principally by GERD and allergic rhinitis.  Her GERD appears to be fairly well controlled.  She does continue to have breakthrough allergic rhinitis.  Continue pantoprazole.  Add loratadine to see if this is beneficial  Pulmonary nodule, right 6 mm pulmonary nodule in a never smoker.  Unclear whether there is significant benefit following this in an 83 year old.  I will defer ordering a CT scan now we can discuss again at her next visit in April.  That would be the one-year mark, if she is concerned about the nodule then we can repeat a CT.  Baltazar Apo, MD, PhD 03/10/2017, 10:14 AM [facility] [phone] or if no answer [phone]

## 2017-03-10 NOTE — Patient Instructions (Addendum)
Please start loratadine (OTC Claritin) 10mg  daily Please continue pantoprazole once a day Continue your Symbicort 2 puffs twice a day. Rinse and gargle after using.  Flu shot today.  Follow with Dr Lamonte Sakai in April 2019, sooner if you have any problems.

## 2017-03-10 NOTE — Assessment & Plan Note (Signed)
6 mm pulmonary nodule in a never smoker.  Unclear whether there is significant benefit following this in an 83 year old.  I will defer ordering a CT scan now we can discuss again at her next visit in April.  That would be the one-year mark, if she is concerned about the nodule then we can repeat a CT.

## 2017-03-10 NOTE — Assessment & Plan Note (Signed)
Principally by GERD and allergic rhinitis.  Her GERD appears to be fairly well controlled.  She does continue to have breakthrough allergic rhinitis.  Continue pantoprazole.  Add loratadine to see if this is beneficial

## 2017-03-10 NOTE — Assessment & Plan Note (Signed)
Minimal Symptoms.  She is using Symbicort but usually only in the evening.  Discussed possibly stopping her scheduled bronchodilator.  She is open to this but does feel that it may help her some.  I will continue it for now.  Continue your Symbicort 2 puffs twice a day. Rinse and gargle after using.  Flu shot today.  Follow with Dr Lamonte Sakai in April 2019, sooner if you have any problems.

## 2017-03-18 ENCOUNTER — Encounter (INDEPENDENT_AMBULATORY_CARE_PROVIDER_SITE_OTHER): Payer: Medicare Other | Admitting: Ophthalmology

## 2017-03-20 ENCOUNTER — Encounter (INDEPENDENT_AMBULATORY_CARE_PROVIDER_SITE_OTHER): Payer: Medicare Other | Admitting: Ophthalmology

## 2017-03-20 DIAGNOSIS — H353231 Exudative age-related macular degeneration, bilateral, with active choroidal neovascularization: Secondary | ICD-10-CM | POA: Diagnosis not present

## 2017-03-20 DIAGNOSIS — H43813 Vitreous degeneration, bilateral: Secondary | ICD-10-CM

## 2017-03-20 DIAGNOSIS — H35033 Hypertensive retinopathy, bilateral: Secondary | ICD-10-CM

## 2017-03-20 DIAGNOSIS — I10 Essential (primary) hypertension: Secondary | ICD-10-CM | POA: Diagnosis not present

## 2017-05-05 ENCOUNTER — Telehealth: Payer: Self-pay | Admitting: Emergency Medicine

## 2017-05-05 ENCOUNTER — Other Ambulatory Visit: Payer: Self-pay | Admitting: Pulmonary Disease

## 2017-05-05 MED ORDER — BUDESONIDE-FORMOTEROL FUMARATE 160-4.5 MCG/ACT IN AERO: INHALATION_SPRAY | RESPIRATORY_TRACT | 5 refills | Status: DC

## 2017-05-05 NOTE — Telephone Encounter (Signed)
Spoke with daughter and advised her that the Symbicort was sent into the pharmacy. Nothing further is needed.

## 2017-05-06 DIAGNOSIS — E039 Hypothyroidism, unspecified: Secondary | ICD-10-CM | POA: Diagnosis not present

## 2017-05-06 DIAGNOSIS — E663 Overweight: Secondary | ICD-10-CM | POA: Diagnosis not present

## 2017-05-06 DIAGNOSIS — Z6828 Body mass index (BMI) 28.0-28.9, adult: Secondary | ICD-10-CM | POA: Diagnosis not present

## 2017-05-06 DIAGNOSIS — J45909 Unspecified asthma, uncomplicated: Secondary | ICD-10-CM | POA: Diagnosis not present

## 2017-05-06 DIAGNOSIS — E782 Mixed hyperlipidemia: Secondary | ICD-10-CM | POA: Diagnosis not present

## 2017-05-06 DIAGNOSIS — Z1389 Encounter for screening for other disorder: Secondary | ICD-10-CM | POA: Diagnosis not present

## 2017-05-06 DIAGNOSIS — N183 Chronic kidney disease, stage 3 (moderate): Secondary | ICD-10-CM | POA: Diagnosis not present

## 2017-05-06 DIAGNOSIS — E1140 Type 2 diabetes mellitus with diabetic neuropathy, unspecified: Secondary | ICD-10-CM | POA: Diagnosis not present

## 2017-06-16 ENCOUNTER — Encounter: Payer: Self-pay | Admitting: Cardiovascular Disease

## 2017-06-16 ENCOUNTER — Ambulatory Visit (INDEPENDENT_AMBULATORY_CARE_PROVIDER_SITE_OTHER): Payer: PPO | Admitting: Cardiovascular Disease

## 2017-06-16 VITALS — BP 132/60 | HR 77 | Wt 209.8 lb

## 2017-06-16 DIAGNOSIS — I10 Essential (primary) hypertension: Secondary | ICD-10-CM | POA: Diagnosis not present

## 2017-06-16 DIAGNOSIS — I5032 Chronic diastolic (congestive) heart failure: Secondary | ICD-10-CM

## 2017-06-16 DIAGNOSIS — I6529 Occlusion and stenosis of unspecified carotid artery: Secondary | ICD-10-CM | POA: Diagnosis not present

## 2017-06-16 DIAGNOSIS — E782 Mixed hyperlipidemia: Secondary | ICD-10-CM

## 2017-06-16 LAB — BASIC METABOLIC PANEL
BUN/Creatinine Ratio: 15 (ref 12–28)
BUN: 18 mg/dL (ref 8–27)
CALCIUM: 9.1 mg/dL (ref 8.7–10.3)
CO2: 27 mmol/L (ref 20–29)
CREATININE: 1.22 mg/dL — AB (ref 0.57–1.00)
Chloride: 102 mmol/L (ref 96–106)
GFR calc Af Amer: 46 mL/min/{1.73_m2} — ABNORMAL LOW (ref >59)
GFR calc non Af Amer: 40 mL/min/{1.73_m2} — ABNORMAL LOW (ref >59)
Glucose: 140 mg/dL — ABNORMAL HIGH (ref 65–99)
Potassium: 4.4 mmol/L (ref 3.5–5.2)
SODIUM: 142 mmol/L (ref 134–144)

## 2017-06-16 LAB — LIPID PANEL
Chol/HDL Ratio: 3.2 ratio (ref 0.0–4.4)
Cholesterol, Total: 175 mg/dL (ref 100–199)
HDL: 55 mg/dL (ref >39)
LDL CALC: 108 mg/dL — AB (ref 0–99)
Triglycerides: 61 mg/dL (ref 0–149)
VLDL CHOLESTEROL CAL: 12 mg/dL (ref 5–40)

## 2017-06-16 LAB — HEPATIC FUNCTION PANEL
ALBUMIN: 3.7 g/dL (ref 3.5–4.7)
ALT: 16 IU/L (ref 0–32)
AST: 18 IU/L (ref 0–40)
Alkaline Phosphatase: 86 IU/L (ref 39–117)
BILIRUBIN TOTAL: 0.4 mg/dL (ref 0.0–1.2)
Bilirubin, Direct: 0.13 mg/dL (ref 0.00–0.40)
TOTAL PROTEIN: 6.2 g/dL (ref 6.0–8.5)

## 2017-06-16 NOTE — Progress Notes (Signed)
Sandra Cuevas Date of Birth  [birth year]       [facility]    [employer] [address], [address], [place], [place]  [address]   [place], [place]  [address] [phone]     [phone]   Fax  [phone]    Fax [phone]  Problem List: 1. Chest pain-normal stress Myoview study 2.  Chronic diastolic CHF - grade 2 DD  3. Hypertension 4. Diabetes Mellitus 5. Carotid bruit  IMPRESSION: Less than 50% stenosis in the right internal carotid artery with some plaque.  Stable 50-69% stenosis in the left internal carotid artery with significant calcified plaque.  Increasing velocity in the left external carotid artery compatible with significant narrowing.  6. RBBB  History of Present Illness: Sandra Cuevas is an 84 year old female with a history of atypical chest pains. She had a negative stress Myoview study in March of 2012. She has a history of asthma and uses albuterol on an as-needed basis. She continues to have shortness of breath with exertion. He also has a history of hypertension.  She complains of chronic dyspnea.   She also has lots of back pain.  Dec. 16, 2014:  Sandra Cuevas is doing well from a cardiac standpoint. She's having lots of noncardiac problems. She's having problems with her bowels. She has lots of neck pain. She had an MRI performed several weeks ago which reveals significant cervical arthritis and disc disease. Her blood pressure has been normal.  She also has lots of asthma. She has used her inhalers on a regular basis.  Dec. 17, 2015:  Sandra Cuevas is a 84 yo with hx of noncardiac CP,  HTN, carotid bruit, and DM.  She may have some sinus isues, hoarsness and scratchy throat.   Jan. 10, 2017:  Oveall doing well. Has occasional intrascapular pain 3 weeks ago.   Lasted about 2 weeks.  Was worse with movement . Has resolved how.   Jan. 23, 2018:  No cardiac issues.   Seen with daughter, Sandra Cuevas .  Has  back and hip issues.   No CP or dyspnea.   Does her normal activities. Does have occasional dyspnea and takes a Lasix to resolve this  Takes Lasix every 2-3 weeks at the most.   Has grade 2 diastolic dysfunction .   June 16, 2017:  Sandra Cuevas is seen back today for follow-up of her chronic diastolic congestive heart failure, hypertension.  She is been seen in the emergency room on several occasions for chest pain.  She had the flu this past winter .     Current Outpatient Medications on File Prior to Visit  Medication Sig Dispense Refill  . acetaminophen (TYLENOL) 500 MG tablet Take 2 tablets (1,000 mg total) by mouth every 6 (six) hours as needed. 30 tablet 0  . albuterol (PROVENTIL) (5 MG/ML) 0.5% nebulizer solution Take 0.5 mLs (2.5 mg total) by nebulization every 4 (four) hours as needed for wheezing or shortness of breath. 20 mL 12  . amLODipine (NORVASC) 10 MG tablet Take 1 tablet (10 mg total) by mouth daily. 30 tablet 0  . atorvastatin (LIPITOR) 10 MG tablet Take 10 mg by mouth Daily.     . budesonide-formoterol (SYMBICORT) 160-4.5 MCG/ACT inhaler INHALE 2 PUFFS INTO THE LUNGS 2 TIMES A DAY. 10.2 g 5  . docusate sodium (COLACE) 100 MG capsule Take 200 mg by mouth at bedtime.    . gabapentin (NEURONTIN) 300  MG capsule Take 300 mg by mouth 2 (two) times daily.     Marland Kitchen levothyroxine (SYNTHROID, LEVOTHROID) 50 MCG tablet Take 50 mcg by mouth daily.      Marland Kitchen losartan (COZAAR) 50 MG tablet Take 50 mg by mouth daily.     . metFORMIN (GLUCOPHAGE) 500 MG tablet Take 1 tablet (500 mg total) by mouth 2 (two) times daily with a meal.    . oxyCODONE-acetaminophen (PERCOCET/ROXICET) 5-325 MG tablet Take 1 tablet by mouth every 4 (four) hours as needed for moderate pain or severe pain. 30 tablet 0  . pantoprazole (PROTONIX) 40 MG tablet Take 1 tablet (40 mg total) by mouth daily. 30 tablet 2  . polyethylene glycol (MIRALAX / GLYCOLAX) packet Take 17 g by mouth daily. 14 each 0  . Polyvinyl  Alcohol-Povidone (MURINE TEARS FOR DRY EYES) 5-6 MG/ML SOLN Place 1 drop into both eyes daily as needed (Dry Eyes).    . potassium chloride (MICRO-K) 10 MEQ CR capsule Take 10 mEq by mouth daily.     Marland Kitchen zolpidem (AMBIEN) 5 MG tablet Take 5 mg by mouth at bedtime.      No current facility-administered medications on file prior to visit.     Not on File  Past Medical History:  Diagnosis Date  . Arthritis   . Asthma   . Atypical chest pain   . 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 Diabetes mellitus   . GERD (gastroesophageal reflux disease)   . History of cardiovascular stress test 06/01/2010   EF 71% - no evidence of ischemia, normal left ventricular systolic function  . History of hysterectomy 1965  . Hyperlipidemia   . Hypertension   . Hypothyroidism   . Shortness of breath    on exertion  . Tubular adenoma of colon     Past Surgical History:  Procedure Laterality Date  . ABDOMINAL HYSTERECTOMY    . CARDIAC SURGERY     catherization  . HEMORRHOID SURGERY    . TOTAL SHOULDER REPLACEMENT Right     Social History   Tobacco Use  Smoking Status Never Smoker  Smokeless Tobacco Never Used    Social History   Substance and Sexual Activity  Alcohol Use No    Family History  Problem Relation Age of Onset  . Heart disease Father        heart problems  . Kidney failure Mother   . Diabetes Mother   . Leukemia Brother   . Diabetes Brother   . Colon cancer Sister   . Leukemia Brother   . Diabetes Brother   . Pancreatic cancer Brother   . Bone cancer Sister     Reviw of Systems:     Physical Exam: Blood pressure 132/60, pulse 77, weight 209 lb 12.8 oz (95.2 kg), SpO2 93 %.  GEN:    Elderly , moderately obese female  HEENT: Normal NECK: No JVD; No carotid bruits LYMPHATICS: No lymphadenopathy CARDIAC: RR RESPIRATORY:  Clear to auscultation without rales, wheezing or rhonchi  ABDOMEN: Soft, non-tender, non-distended MUSCULOSKELETAL:  No  edema; No deformity  SKIN: Warm and dry NEUROLOGIC:  Alert and oriented x 3   ECG: June 16, 2017:  NSR with freq PVCs,  RBBB   Assessment / Plan:   1. Chest pain-   No recent CP   2. Chronic diastolic CHF -  Takes lasix every several weeks or so when she has shortness of breath and  leg swelling   3. Hypertension -  BP is well contrrolled ,  Check labs today   4. Diabetes Mellitus -   5. Carotid bruit -   she has a history of mild carotid plaque on carotid duplex scan from 2013.  I did not hear any bruits today but we will recheck a carotid duplex scan.  6.  Hyperlipidemia :   On atorvastatin 10 ,  Check labs today     Mertie Moores, MD  06/16/2017 9:25 AM    [address] [place],  [place], [place]  [address] Pager [phone] Phone: [phone]; Fax: [phone]

## 2017-06-16 NOTE — Patient Instructions (Addendum)
Medication Instructions:  Your physician recommends that you continue on your current medications as directed. Please refer to the Current Medication list given to you today.   Labwork: TODAY - cholesterol, liver panel, basic metabolic panel   Testing/Procedures: Your physician has requested that you have a carotid duplex. This test is an ultrasound of the carotid arteries in your neck. It looks at blood flow through these arteries that supply the brain with blood. Allow one hour for this exam. There are no restrictions or special instructions.   Follow-Up: Your physician wants you to follow-up in: 1 year with Dr. Acie Fredrickson. You will receive a reminder letter in the mail two months in advance. If you don't receive a letter, please call our office to schedule the follow-up appointment.   If you need a refill on your cardiac medications before your next appointment, please call your pharmacy.   Thank you for choosing [facility]! Christen Bame, RN [phone]

## 2017-06-17 ENCOUNTER — Other Ambulatory Visit: Payer: Self-pay | Admitting: Cardiovascular Disease

## 2017-06-17 DIAGNOSIS — I6529 Occlusion and stenosis of unspecified carotid artery: Secondary | ICD-10-CM

## 2017-06-18 ENCOUNTER — Telehealth: Payer: Self-pay | Admitting: Cardiovascular Disease

## 2017-06-18 DIAGNOSIS — E782 Mixed hyperlipidemia: Secondary | ICD-10-CM

## 2017-06-18 MED ORDER — ATORVASTATIN CALCIUM 40 MG PO TABS: 40.0000 mg | ORAL_TABLET | Freq: Every day | ORAL | 3 refills | Status: DC

## 2017-06-18 NOTE — Telephone Encounter (Signed)
Reviewed lab results and plan of care with patient. She verbalized understanding and agreement and verified her pharmacy for Atorvastatin 40 mg. I scheduled her repeat lab appointment for 6/18 and advised her to call sooner with questions or concerns. She thanked me for the call.

## 2017-06-18 NOTE — Telephone Encounter (Signed)
-----   Message from Thayer Headings, MD sent at 06/17/2017  1:08 PM EDT ----- Mild carotid plaque. LDL is 108.  Increase atorvastatin to 40 mg a day   Check lipids , liver, BMP in 3 months

## 2017-06-18 NOTE — Telephone Encounter (Signed)
New Message ° ° °Patient is returning call in reference to labs. Please call to discuss.  °

## 2017-06-19 ENCOUNTER — Ambulatory Visit (HOSPITAL_COMMUNITY)
Admission: RE | Admit: 2017-06-19 | Discharge: 2017-06-19 | Disposition: A | Discharge status: Home / Self Care | Payer: PPO | Source: Ambulatory Visit | Attending: Cardiovascular Disease | Admitting: Cardiovascular Disease

## 2017-06-19 DIAGNOSIS — I6523 Occlusion and stenosis of bilateral carotid arteries: Secondary | ICD-10-CM | POA: Insufficient documentation

## 2017-06-19 DIAGNOSIS — I6529 Occlusion and stenosis of unspecified carotid artery: Secondary | ICD-10-CM | POA: Diagnosis not present

## 2017-07-02 DIAGNOSIS — D696 Thrombocytopenia, unspecified: Secondary | ICD-10-CM | POA: Diagnosis not present

## 2017-07-08 ENCOUNTER — Encounter: Payer: Self-pay | Admitting: Emergency Medicine

## 2017-07-08 ENCOUNTER — Ambulatory Visit: Payer: PPO | Admitting: Emergency Medicine

## 2017-07-08 DIAGNOSIS — R911 Solitary pulmonary nodule: Secondary | ICD-10-CM

## 2017-07-08 DIAGNOSIS — J449 Chronic obstructive pulmonary disease, unspecified: Secondary | ICD-10-CM | POA: Diagnosis not present

## 2017-07-08 DIAGNOSIS — J209 Acute bronchitis, unspecified: Secondary | ICD-10-CM | POA: Diagnosis not present

## 2017-07-08 MED ORDER — AZITHROMYCIN 1 G PO PACK
1.0000 g | PACK | Freq: Once | ORAL | 0 refills | Status: AC
Start: 2017-07-08 — End: 2017-07-08

## 2017-07-08 MED ORDER — FLUTICASONE PROPIONATE 50 MCG/ACT NA SUSP: 2.0000 | Freq: Every day | NASAL | 2 refills | Status: DC

## 2017-07-08 MED ORDER — PREDNISONE 10 MG PO TABS: ORAL_TABLET | ORAL | 0 refills | Status: DC

## 2017-07-08 NOTE — Assessment & Plan Note (Signed)
Please continue your Symbicort 2 puffs twice a day.  Remember to rinse and gargle after using this medication. Call our office if you are not improving over the next 4-5 days.  Follow with Dr Lamonte Sakai in 3 months or sooner if you have any problems.

## 2017-07-08 NOTE — Assessment & Plan Note (Signed)
You may continue to use your over-the-counter cough syrup and cold medication as you have been taking them. Please start fluticasone nasal spray, 2 sprays each nostril once a day. Please take prednisone as directed until completely gone. Please take azithromycin as directed until completely gone.

## 2017-07-08 NOTE — Assessment & Plan Note (Signed)
Unclear clinical significance.  I reviewed it again with her today.  Given its size and her age not clear that it needs further workup.  I will defer her CT scan of the chest now.  We will revisit as we go forward and depending on her concern, curiosity we may decide to repeat the CT to look for interval change.

## 2017-07-08 NOTE — Addendum Note (Signed)
Addended by: Shirlee More R on: 07/08/2017 11:26 AM   Modules accepted: Orders

## 2017-07-08 NOTE — Patient Instructions (Signed)
Please continue your Symbicort 2 puffs twice a day.  Remember to rinse and gargle after using this medication. You may continue to use your over-the-counter cough syrup and cold medication as you have been taking them. Please start fluticasone nasal spray, 2 sprays each nostril once a day. Please take prednisone as directed until completely gone. Please take azithromycin as directed until completely gone. We will hold off on repeating a CT scan of the chest To follow small pulmonary noduleat this time.  We may decide to reconsider at some point in the future.  Call our office if you are not improving over the next 4-5 days.  Follow with Dr Lamonte Sakai in 3 months or sooner if you have any problems.

## 2017-07-08 NOTE — Progress Notes (Signed)
Subjective:    Patient ID: Sandra Cuevas, female    DOB: 1929-03-20, 84 y.o.   MRN: [id number]  HPI 84 year old woman, never smoker, history of hypertension, hyperlipidemia, hypothyroidism, COPD on the basis of chronic asthma. The dx of asthma was first was made in late teens. She was treated initially but she no longer needed meds when she stopped working in [employer].  Pulmonary function testing performed 04/20/13 was reviewed by me today. This shows moderate obstruction without a bronchodilator response. She has been managed on Symbicort twice a day and albuterol when necessary. She is on Dexilant bid, is not using any allergy regimen at this time. She has a chronic hoarse voice, has undergone laryngoscopy before. She is active, is able to exert herself. She has nebs and albuterol HFA to use prn but rarely needs it. No cough. Rare wheeze. No CP.    ROV 09/05/15 -- follow-up visit for COPD in the setting of chronic fixed asthma.also with a chronic hoarse voice. She has seen ENT before - no pathology seen. She is on dexilant. No flares. She has albuterol available to use prn, has not needed it. On scheduled Symbicort. She is having a lot of back trouble that is limiting her activity, not her breathing. No real cough. Her hoarse voice is stable.   Acute office visit 08/08/16 -- patient has a history of COPD / fixed asthma, chronic hoarseness, hiatal hernia and GERD. She presents today for an acute visit to evaluate chest discomfort. She reports that she has been having mid chest pain to her back, beginning in late March. It woke her from sleep. Seems to be present to some degree at all times. She had a CT angiogram 07/14/16 that showed no dissection, no PE, no infiltrates, a 28mm RLL nodule. ECG's were reassuring. She is on omeprazole bid. She has been treated w narcotics, most recently steroids - no relief. She denies dyspnea.   ROV 10/14/16 -- This is a follow-up visit for history of COPD/fixed asthma,  chronic hoarseness, hiatal hernia with GERD. She also has a 6 mm right lower lobe nodule noted on CT scan 07/14/16. Was felt that he noise was related to GERD and I changed her omeprazole to pantoprazole in May. Her chest discomfort and cough got better. She began to have scratchy throat 2 weeks ago, hoarse voice. Started to cough and have HA and nasal / throat drainage. She is on Symbicort. Coughing up yellow mucous. No fevers. She has been using tylenol.   ROC 03/10/17 --84 year old woman with a history of fixed asthma and COPD (never smoker), GERD with a hiatal hernia, chronic hoarseness.  At her last visit in July we treated her for an acute bronchitis.  She has a 6 mm right lower lobe nodule on CT scan 07/14/16. She feels that her breathing is doing well. She remains on symbicort. She is on pantoprazole. Her voice stays hoarse. She has a lot of nasal congestion and drainage.   ROV 07/08/17 --this is a follow-up visit for patient with a history of fixed asthma and upper airway irritation syndrome in the setting of GERD/hiatal hernia.  She has chronic hoarseness.  She also has a right lower lobe 6 mm pulmonary nodule.  She returns today reporting that she began to have more difficulty with sinus drainage, congestion, clear nasal drainge. Cough prod of yellow mucous. No fevers, some increase in dyspnea. She is on Symbicort bid. She started robitussin and phenylephrine.  Review of Systems As per HPI      Objective:   Physical Exam Vitals:   07/08/17 1056  BP: 127/72  Pulse: 76  SpO2: 92%  Weight: 211 lb (95.7 kg)  Height: 5\' 6"  (1.676 m)   Gen: Pleasant, well-nourished, in no distress,  normal affect  ENT: No lesions,  mouth clear,  oropharynx clear, nasal congestion and nasal voice  Neck: No JVD, no stridor  Lungs: No use of accessory muscles, clear without rales or rhonchi, soft B basilar exp wheeze.   Cardiovascular: RRR, heart sounds normal, no murmur or gallops, no peripheral  edema  Musculoskeletal: No deformities  Neuro: alert, non focal  Skin: Warm, no lesions or rashes      Assessment & Plan:  Chronic obstructive airway disease with asthma Please continue your Symbicort 2 puffs twice a day.  Remember to rinse and gargle after using this medication. Call our office if you are not improving over the next 4-5 days.  Follow with Dr Lamonte Sakai in 3 months or sooner if you have any problems.  Acute bronchitis You may continue to use your over-the-counter cough syrup and cold medication as you have been taking them. Please start fluticasone nasal spray, 2 sprays each nostril once a day. Please take prednisone as directed until completely gone. Please take azithromycin as directed until completely gone.  Pulmonary nodule, right Unclear clinical significance.  I reviewed it again with her today.  Given its size and her age not clear that it needs further workup.  I will defer her CT scan of the chest now.  We will revisit as we go forward and depending on her concern, curiosity we may decide to repeat the CT to look for interval change.  Baltazar Apo, MD, PhD 07/08/2017, 11:22 AM [facility] [phone] or if no answer [phone]

## 2017-07-24 ENCOUNTER — Encounter (INDEPENDENT_AMBULATORY_CARE_PROVIDER_SITE_OTHER): Payer: PPO | Admitting: Ophthalmology

## 2017-07-24 DIAGNOSIS — I10 Essential (primary) hypertension: Secondary | ICD-10-CM

## 2017-07-24 DIAGNOSIS — H35033 Hypertensive retinopathy, bilateral: Secondary | ICD-10-CM | POA: Diagnosis not present

## 2017-07-24 DIAGNOSIS — H43813 Vitreous degeneration, bilateral: Secondary | ICD-10-CM | POA: Diagnosis not present

## 2017-07-24 DIAGNOSIS — H353231 Exudative age-related macular degeneration, bilateral, with active choroidal neovascularization: Secondary | ICD-10-CM

## 2017-09-23 ENCOUNTER — Other Ambulatory Visit: Payer: PPO

## 2017-09-23 ENCOUNTER — Encounter (INDEPENDENT_AMBULATORY_CARE_PROVIDER_SITE_OTHER): Payer: Self-pay

## 2017-09-23 DIAGNOSIS — E782 Mixed hyperlipidemia: Secondary | ICD-10-CM

## 2017-09-23 LAB — LIPID PANEL
CHOL/HDL RATIO: 2.9 ratio (ref 0.0–4.4)
Cholesterol, Total: 143 mg/dL (ref 100–199)
HDL: 50 mg/dL (ref >39)
LDL Calculated: 77 mg/dL (ref 0–99)
Triglycerides: 79 mg/dL (ref 0–149)
VLDL Cholesterol Cal: 16 mg/dL (ref 5–40)

## 2017-09-23 LAB — HEPATIC FUNCTION PANEL
ALBUMIN: 3.8 g/dL (ref 3.5–4.7)
ALK PHOS: 108 IU/L (ref 39–117)
ALT: 22 IU/L (ref 0–32)
AST: 25 IU/L (ref 0–40)
BILIRUBIN, DIRECT: 0.13 mg/dL (ref 0.00–0.40)
Bilirubin Total: 0.4 mg/dL (ref 0.0–1.2)
TOTAL PROTEIN: 5.9 g/dL — AB (ref 6.0–8.5)

## 2017-09-23 LAB — BASIC METABOLIC PANEL
BUN/Creatinine Ratio: 16 (ref 12–28)
BUN: 15 mg/dL (ref 8–27)
CHLORIDE: 103 mmol/L (ref 96–106)
CO2: 30 mmol/L — AB (ref 20–29)
CREATININE: 0.95 mg/dL (ref 0.57–1.00)
Calcium: 9.3 mg/dL (ref 8.7–10.3)
GFR calc Af Amer: 62 mL/min/{1.73_m2} (ref >59)
GFR calc non Af Amer: 54 mL/min/{1.73_m2} — ABNORMAL LOW (ref >59)
GLUCOSE: 118 mg/dL — AB (ref 65–99)
Potassium: 4.6 mmol/L (ref 3.5–5.2)
SODIUM: 144 mmol/L (ref 134–144)

## 2017-10-02 DIAGNOSIS — X32XXXA Exposure to sunlight, initial encounter: Secondary | ICD-10-CM | POA: Diagnosis not present

## 2017-10-02 DIAGNOSIS — L570 Actinic keratosis: Secondary | ICD-10-CM | POA: Diagnosis not present

## 2017-10-02 DIAGNOSIS — L439 Lichen planus, unspecified: Secondary | ICD-10-CM | POA: Diagnosis not present

## 2017-10-02 DIAGNOSIS — C44311 Basal cell carcinoma of skin of nose: Secondary | ICD-10-CM | POA: Diagnosis not present

## 2017-10-17 DIAGNOSIS — E1140 Type 2 diabetes mellitus with diabetic neuropathy, unspecified: Secondary | ICD-10-CM | POA: Diagnosis not present

## 2017-10-30 DIAGNOSIS — C44311 Basal cell carcinoma of skin of nose: Secondary | ICD-10-CM | POA: Diagnosis not present

## 2017-10-30 DIAGNOSIS — L438 Other lichen planus: Secondary | ICD-10-CM | POA: Diagnosis not present

## 2017-11-17 ENCOUNTER — Encounter: Payer: Self-pay | Admitting: Emergency Medicine

## 2017-11-17 ENCOUNTER — Ambulatory Visit: Payer: PPO | Admitting: Emergency Medicine

## 2017-11-17 DIAGNOSIS — J449 Chronic obstructive pulmonary disease, unspecified: Secondary | ICD-10-CM

## 2017-11-17 DIAGNOSIS — K219 Gastro-esophageal reflux disease without esophagitis: Secondary | ICD-10-CM | POA: Diagnosis not present

## 2017-11-17 DIAGNOSIS — J310 Chronic rhinitis: Secondary | ICD-10-CM | POA: Diagnosis not present

## 2017-11-17 NOTE — Assessment & Plan Note (Signed)
Mild asthma without any overt sx. She has a lot of hoarse voice - I spoke to her today about possibly stopping symbicort temporarily to see if this helps her voice. She will consider trying this.

## 2017-11-17 NOTE — Assessment & Plan Note (Signed)
Using flonase  But only prn. Discussed with her trying to increase to qd to see if this would help her hoarseness.

## 2017-11-17 NOTE — Patient Instructions (Addendum)
Please continue to use your symbicort 2 puffs twice a day. You could consider taking a break from this medication to see if you miss it. It sometimes irritates patients' throats.  Please continue your flonase as needed. You could consider taking this medication every day to see if it helps your hoarse voice.  Continue your protonix as ordered.  Follow with Dr Lamonte Sakai in 4 months or sooner if you have any problems.

## 2017-11-17 NOTE — Progress Notes (Signed)
Subjective:    Patient ID: Sandra Cuevas, female    DOB: 08/14/1928, 84 y.o.   MRN: [id number]  HPI 84 year old woman, never smoker, history of hypertension, hyperlipidemia, hypothyroidism, COPD on the basis of chronic asthma. The dx of asthma was first was made in late teens. She was treated initially but she no longer needed meds when she stopped working in [employer].  Pulmonary function testing performed 04/20/13 was reviewed by me today. This shows moderate obstruction without a bronchodilator response. She has been managed on Symbicort twice a day and albuterol when necessary. She is on Dexilant bid, is not using any allergy regimen at this time. She has a chronic hoarse voice, has undergone laryngoscopy before. She is active, is able to exert herself. She has nebs and albuterol HFA to use prn but rarely needs it. No cough. Rare wheeze. No CP.    ROV 09/05/15 -- follow-up visit for COPD in the setting of chronic fixed asthma.also with a chronic hoarse voice. She has seen ENT before - no pathology seen. She is on dexilant. No flares. She has albuterol available to use prn, has not needed it. On scheduled Symbicort. She is having a lot of back trouble that is limiting her activity, not her breathing. No real cough. Her hoarse voice is stable.   ROV 07/08/17 --this is a follow-up visit for patient with a history of fixed asthma and upper airway irritation syndrome in the setting of GERD/hiatal hernia.  She has chronic hoarseness.  She also has a right lower lobe 6 mm pulmonary nodule.  She returns today reporting that she began to have more difficulty with sinus drainage, congestion, clear nasal drainge. Cough prod of yellow mucous. No fevers, some increase in dyspnea. She is on Symbicort bid. She started robitussin and phenylephrine.   ROV 11/17/17 --this pleasant 84 year old woman with a history of upper airway irritation syndrome, fixed asthma both influenced by her GERD and hiatal hernia.  She has  chronic hoarseness.  We have also been following a 6 mm right lower lobe pulmonary nodule.  At her last visit in April we treated her for an acute exacerbation with prednisone and azithromycin.  We also added fluticasone nasal spray.  Her chronic bronchodilator is Symbicort. She continues to have intermittent hoarseness. She is on protonix, no breakthrough. She is on flonase prn.     Review of Systems As per HPI      Objective:   Physical Exam Vitals:   11/17/17 1427  BP: 132/84  Pulse: 91  SpO2: 91%  Weight: 216 lb (98 kg)  Height: 5\' 6"  (1.676 m)   Gen: Pleasant, well-nourished, in no distress,  normal affect  ENT: No lesions,  mouth clear,  oropharynx clear, nasal congestion and nasal voice  Neck: No JVD, no stridor but she is hoarse.   Lungs: No use of accessory muscles, clear without rales or rhonchi, no wheeze.   Cardiovascular: RRR, heart sounds normal, no murmur or gallops, no peripheral edema  Musculoskeletal: No deformities  Neuro: alert, non focal  Skin: Warm, no lesions or rashes      Assessment & Plan:  Chronic obstructive airway disease with asthma Mild asthma without any overt sx. She has a lot of hoarse voice - I spoke to her today about possibly stopping symbicort temporarily to see if this helps her voice. She will consider trying this.   GERD (gastroesophageal reflux disease) Continue protonix.   Chronic rhinitis Using flonase  But only  prn. Discussed with her trying to increase to qd to see if this would help her hoarseness.   Baltazar Apo, MD, PhD 11/17/2017, 2:59 PM [facility] [phone] or if no answer [phone]

## 2017-11-17 NOTE — Assessment & Plan Note (Signed)
Continue protonix  

## 2017-12-04 ENCOUNTER — Encounter (INDEPENDENT_AMBULATORY_CARE_PROVIDER_SITE_OTHER): Payer: PPO | Admitting: Ophthalmology

## 2017-12-04 DIAGNOSIS — I10 Essential (primary) hypertension: Secondary | ICD-10-CM

## 2017-12-04 DIAGNOSIS — H43813 Vitreous degeneration, bilateral: Secondary | ICD-10-CM

## 2017-12-04 DIAGNOSIS — H353231 Exudative age-related macular degeneration, bilateral, with active choroidal neovascularization: Secondary | ICD-10-CM | POA: Diagnosis not present

## 2017-12-04 DIAGNOSIS — H35033 Hypertensive retinopathy, bilateral: Secondary | ICD-10-CM

## 2017-12-24 DIAGNOSIS — I701 Atherosclerosis of renal artery: Secondary | ICD-10-CM | POA: Diagnosis not present

## 2017-12-24 DIAGNOSIS — E119 Type 2 diabetes mellitus without complications: Secondary | ICD-10-CM | POA: Diagnosis not present

## 2017-12-24 DIAGNOSIS — M5136 Other intervertebral disc degeneration, lumbar region: Secondary | ICD-10-CM | POA: Diagnosis not present

## 2017-12-24 DIAGNOSIS — E785 Hyperlipidemia, unspecified: Secondary | ICD-10-CM | POA: Diagnosis not present

## 2017-12-24 DIAGNOSIS — Z23 Encounter for immunization: Secondary | ICD-10-CM | POA: Diagnosis not present

## 2017-12-24 DIAGNOSIS — E6609 Other obesity due to excess calories: Secondary | ICD-10-CM | POA: Diagnosis not present

## 2017-12-24 DIAGNOSIS — M1991 Primary osteoarthritis, unspecified site: Secondary | ICD-10-CM | POA: Diagnosis not present

## 2017-12-24 DIAGNOSIS — Z1389 Encounter for screening for other disorder: Secondary | ICD-10-CM | POA: Diagnosis not present

## 2017-12-24 DIAGNOSIS — E039 Hypothyroidism, unspecified: Secondary | ICD-10-CM | POA: Diagnosis not present

## 2017-12-24 DIAGNOSIS — J45909 Unspecified asthma, uncomplicated: Secondary | ICD-10-CM | POA: Diagnosis not present

## 2017-12-24 DIAGNOSIS — Z6835 Body mass index (BMI) 35.0-35.9, adult: Secondary | ICD-10-CM | POA: Diagnosis not present

## 2017-12-24 DIAGNOSIS — Z0001 Encounter for general adult medical examination with abnormal findings: Secondary | ICD-10-CM | POA: Diagnosis not present

## 2017-12-30 ENCOUNTER — Other Ambulatory Visit: Payer: Self-pay | Admitting: *Deleted

## 2017-12-30 DIAGNOSIS — I6529 Occlusion and stenosis of unspecified carotid artery: Secondary | ICD-10-CM

## 2018-03-09 ENCOUNTER — Other Ambulatory Visit (HOSPITAL_COMMUNITY): Payer: Self-pay | Admitting: Family Medicine

## 2018-03-09 DIAGNOSIS — Z1231 Encounter for screening mammogram for malignant neoplasm of breast: Secondary | ICD-10-CM

## 2018-03-18 ENCOUNTER — Ambulatory Visit (HOSPITAL_COMMUNITY)
Admission: RE | Admit: 2018-03-18 | Discharge: 2018-03-18 | Disposition: A | Discharge status: Home / Self Care | Payer: PPO | Source: Ambulatory Visit | Attending: Family Medicine | Admitting: Family Medicine

## 2018-03-18 DIAGNOSIS — Z1231 Encounter for screening mammogram for malignant neoplasm of breast: Secondary | ICD-10-CM | POA: Diagnosis not present

## 2018-04-07 ENCOUNTER — Ambulatory Visit (INDEPENDENT_AMBULATORY_CARE_PROVIDER_SITE_OTHER): Payer: PPO | Admitting: Emergency Medicine

## 2018-04-07 ENCOUNTER — Encounter: Payer: Self-pay | Admitting: Emergency Medicine

## 2018-04-07 DIAGNOSIS — J310 Chronic rhinitis: Secondary | ICD-10-CM | POA: Diagnosis not present

## 2018-04-07 DIAGNOSIS — J449 Chronic obstructive pulmonary disease, unspecified: Secondary | ICD-10-CM | POA: Diagnosis not present

## 2018-04-07 DIAGNOSIS — K219 Gastro-esophageal reflux disease without esophagitis: Secondary | ICD-10-CM

## 2018-04-07 DIAGNOSIS — R911 Solitary pulmonary nodule: Secondary | ICD-10-CM

## 2018-04-07 NOTE — Assessment & Plan Note (Addendum)
She has started using her Symbicort only as needed, unclear that she is really missed it.  Unfortunately also unclear that it is helped her hoarseness or upper airway irritation either.  I think at this point we can stop the Symbicort altogether, use albuterol as needed.  Assess her next time to sort out whether she truly misses the Symbicort.  Start guaifenesin to help with her secretion management

## 2018-04-07 NOTE — Assessment & Plan Note (Signed)
Continue PPI ?

## 2018-04-07 NOTE — Assessment & Plan Note (Signed)
Continue her current allergy regimen 

## 2018-04-07 NOTE — Progress Notes (Signed)
Subjective:    Patient ID: Sandra Cuevas, female    DOB: [birth year], 84 y.o.   MRN: [id number]  HPI 84 year old woman, never smoker, history of hypertension, hyperlipidemia, hypothyroidism, COPD on the basis of chronic asthma. The dx of asthma was first was made in late teens. She was treated initially but she no longer needed meds when she stopped working in [employer].  Pulmonary function testing performed 04/20/13 was reviewed by me today. This shows moderate obstruction without a bronchodilator response. She has been managed on Symbicort twice a day and albuterol when necessary. She is on Dexilant bid, is not using any allergy regimen at this time. She has a chronic hoarse voice, has undergone laryngoscopy before. She is active, is able to exert herself. She has nebs and albuterol HFA to use prn but rarely needs it. No cough. Rare wheeze. No CP.    ROV 09/05/15 -- follow-up visit for COPD in the setting of chronic fixed asthma.also with a chronic hoarse voice. She has seen ENT before - no pathology seen. She is on dexilant. No flares. She has albuterol available to use prn, has not needed it. On scheduled Symbicort. She is having a lot of back trouble that is limiting her activity, not her breathing. No real cough. Her hoarse voice is stable.   ROV 07/08/17 --this is a follow-up visit for patient with a history of fixed asthma and upper airway irritation syndrome in the setting of GERD/hiatal hernia.  She has chronic hoarseness.  She also has a right lower lobe 6 mm pulmonary nodule.  She returns today reporting that she began to have more difficulty with sinus drainage, congestion, clear nasal drainge. Cough prod of yellow mucous. No fevers, some increase in dyspnea. She is on Symbicort bid. She started robitussin and phenylephrine.   ROV 11/17/17 --this pleasant 84 year old woman with a history of upper airway irritation syndrome, fixed asthma both influenced by her GERD and hiatal hernia.  She has  chronic hoarseness.  We have also been following a 6 mm right lower lobe pulmonary nodule.  At her last visit in April we treated her for an acute exacerbation with prednisone and azithromycin.  We also added fluticasone nasal spray.  Her chronic bronchodilator is Symbicort. She continues to have intermittent hoarseness. She is on protonix, no breakthrough. She is on flonase prn.   ROV 04/07/18 --follow-up visit, history of upper airway irritation syndrome and fixed asthma.  She has GERD and a hiatal hernia that influence both of these.  We have been following a 6 mm right lower lobe pulmonary nodule, last CT was 07/14/2016.  Given her age and the slow change we have deferred repeat CT so far. Since last time we held her Symbicort to see if this would impact her throat irritation and hoarseness. She has decreased it to once most evenings. She is coughing up thick mucous every am, white / clear. She rarely uses albuterol, has nebs and HFA.    Review of Systems As per HPI      Objective:   Physical Exam Vitals:   04/07/18 1126  BP: 132/84  Pulse: 75  SpO2: 94%  Weight: 217 lb (98.4 kg)  Height: 5\' 4"  (1.626 m)   Gen: Pleasant, well-nourished, in no distress,  normal affect  ENT: No lesions,  mouth clear,  oropharynx clear, nasal congestion and nasal voice  Neck: No JVD, no stridor but she is hoarse.   Lungs: No use of accessory muscles, clear  without rales or rhonchi, no wheeze.   Cardiovascular: RRR, heart sounds normal, no murmur or gallops, no peripheral edema  Musculoskeletal: No deformities  Neuro: alert, non focal  Skin: Warm, no lesions or rashes      Assessment & Plan:  Chronic obstructive airway disease with asthma She has started using her Symbicort only as needed, unclear that she is really missed it.  Unfortunately also unclear that it is helped her hoarseness or upper airway irritation either.  I think at this point we can stop the Symbicort altogether, use albuterol  as needed.  Assess her next time to sort out whether she truly misses the Symbicort.  Start guaifenesin to help with her secretion management  Chronic rhinitis Continue her current allergy regimen  GERD (gastroesophageal reflux disease) Continue PPI  Pulmonary nodule, right Based on her age and the very slow changes been noted on serial CT scans we decided to defer repeat CT at this time.  We may revisit this going forward depending on suspicion, any clinical symptoms that evolved.  Baltazar Apo, MD, PhD 04/07/2018, 12:49 PM [facility] [phone] or if no answer [phone]

## 2018-04-07 NOTE — Assessment & Plan Note (Signed)
Based on her age and the very slow changes been noted on serial CT scans we decided to defer repeat CT at this time.  We may revisit this going forward depending on suspicion, any clinical symptoms that evolved.

## 2018-04-07 NOTE — Patient Instructions (Addendum)
Please start guaifenesin 600mg  twice a day Stop Symbicort altogether. Please use albuterol inhaler, 2 puffs if you need it for shortness of breath, chest tightness, wheezing.  We will write a new prescription for this. You may also use your albuterol nebulizer if needed for shortness of breath. We will not repeat your CT scan of the chest at this time. Follow with Dr Lamonte Sakai in 3 months or sooner if you have any problems.

## 2018-04-09 ENCOUNTER — Ambulatory Visit (HOSPITAL_COMMUNITY)
Admission: RE | Admit: 2018-04-09 | Discharge: 2018-04-09 | Disposition: A | Discharge status: Home / Self Care | Payer: PPO | Source: Ambulatory Visit | Attending: Family Medicine | Admitting: Family Medicine

## 2018-04-09 ENCOUNTER — Other Ambulatory Visit (HOSPITAL_COMMUNITY): Payer: Self-pay | Admitting: Family Medicine

## 2018-04-09 DIAGNOSIS — R109 Unspecified abdominal pain: Secondary | ICD-10-CM | POA: Insufficient documentation

## 2018-04-09 DIAGNOSIS — M25552 Pain in left hip: Secondary | ICD-10-CM

## 2018-04-09 DIAGNOSIS — Z1389 Encounter for screening for other disorder: Secondary | ICD-10-CM | POA: Diagnosis not present

## 2018-04-09 DIAGNOSIS — G4700 Insomnia, unspecified: Secondary | ICD-10-CM | POA: Diagnosis not present

## 2018-04-09 DIAGNOSIS — Z6835 Body mass index (BMI) 35.0-35.9, adult: Secondary | ICD-10-CM | POA: Diagnosis not present

## 2018-04-09 DIAGNOSIS — R1032 Left lower quadrant pain: Secondary | ICD-10-CM | POA: Diagnosis not present

## 2018-04-23 ENCOUNTER — Encounter (INDEPENDENT_AMBULATORY_CARE_PROVIDER_SITE_OTHER): Payer: PPO | Admitting: Ophthalmology

## 2018-04-23 DIAGNOSIS — I10 Essential (primary) hypertension: Secondary | ICD-10-CM | POA: Diagnosis not present

## 2018-04-23 DIAGNOSIS — H43813 Vitreous degeneration, bilateral: Secondary | ICD-10-CM | POA: Diagnosis not present

## 2018-04-23 DIAGNOSIS — H353231 Exudative age-related macular degeneration, bilateral, with active choroidal neovascularization: Secondary | ICD-10-CM

## 2018-04-23 DIAGNOSIS — H35033 Hypertensive retinopathy, bilateral: Secondary | ICD-10-CM | POA: Diagnosis not present

## 2018-04-23 DIAGNOSIS — E119 Type 2 diabetes mellitus without complications: Secondary | ICD-10-CM | POA: Diagnosis not present

## 2018-05-27 DIAGNOSIS — E119 Type 2 diabetes mellitus without complications: Secondary | ICD-10-CM | POA: Diagnosis not present

## 2018-05-27 DIAGNOSIS — Z0001 Encounter for general adult medical examination with abnormal findings: Secondary | ICD-10-CM | POA: Diagnosis not present

## 2018-05-27 DIAGNOSIS — E6609 Other obesity due to excess calories: Secondary | ICD-10-CM | POA: Diagnosis not present

## 2018-05-27 DIAGNOSIS — Z6834 Body mass index (BMI) 34.0-34.9, adult: Secondary | ICD-10-CM | POA: Diagnosis not present

## 2018-05-27 DIAGNOSIS — R140 Abdominal distension (gaseous): Secondary | ICD-10-CM | POA: Diagnosis not present

## 2018-05-27 DIAGNOSIS — Z1389 Encounter for screening for other disorder: Secondary | ICD-10-CM | POA: Diagnosis not present

## 2018-05-27 DIAGNOSIS — E785 Hyperlipidemia, unspecified: Secondary | ICD-10-CM | POA: Diagnosis not present

## 2018-05-27 DIAGNOSIS — I5030 Unspecified diastolic (congestive) heart failure: Secondary | ICD-10-CM | POA: Diagnosis not present

## 2018-05-27 DIAGNOSIS — E039 Hypothyroidism, unspecified: Secondary | ICD-10-CM | POA: Diagnosis not present

## 2018-06-09 ENCOUNTER — Encounter: Payer: Self-pay | Admitting: Cardiovascular Disease

## 2018-06-16 DIAGNOSIS — H1031 Unspecified acute conjunctivitis, right eye: Secondary | ICD-10-CM | POA: Diagnosis not present

## 2018-06-16 DIAGNOSIS — H02403 Unspecified ptosis of bilateral eyelids: Secondary | ICD-10-CM | POA: Diagnosis not present

## 2018-06-18 ENCOUNTER — Other Ambulatory Visit: Payer: Self-pay

## 2018-06-18 ENCOUNTER — Encounter: Payer: Self-pay | Admitting: Gastroenterology

## 2018-06-18 ENCOUNTER — Other Ambulatory Visit: Payer: Self-pay | Admitting: Cardiovascular Disease

## 2018-06-18 ENCOUNTER — Ambulatory Visit (INDEPENDENT_AMBULATORY_CARE_PROVIDER_SITE_OTHER): Payer: PPO | Admitting: Gastroenterology

## 2018-06-18 VITALS — BP 134/68 | HR 72 | Temp 98.6°F | Ht 66.0 in | Wt 216.4 lb

## 2018-06-18 DIAGNOSIS — R1030 Lower abdominal pain, unspecified: Secondary | ICD-10-CM

## 2018-06-18 DIAGNOSIS — R140 Abdominal distension (gaseous): Secondary | ICD-10-CM | POA: Diagnosis not present

## 2018-06-18 DIAGNOSIS — K5909 Other constipation: Secondary | ICD-10-CM

## 2018-06-18 NOTE — Patient Instructions (Addendum)
If you are age 82 or older, your body mass index should be between 23-30. Your Body mass index is 34.92 kg/m. If this is out of the aforementioned range listed, please consider follow up with your Primary Care Provider.  If you are age 33 or younger, your body mass index should be between 19-25. Your Body mass index is 34.92 kg/m. If this is out of the aformentioned range listed, please consider follow up with your Primary Care Provider.   Miralax , one packet every other day.  If no results within a week, increase to daily use and adjust amount and frequency as needed.  Call us for advice if needed. [phone]  It was a pleasure to see you today!  Dr. Loletha Carrow

## 2018-06-18 NOTE — Progress Notes (Signed)
[facility] Progress Note  Chief Complaint: Lower abdominal pain  Subjective  History:  First seen by me June 2018 for atypical chest pain that I did not feel was reflux related.  I felt she was having significant referred arthritic pain from the back after a compression fracture.  She was seen once in follow-up November 2018, having tapered down her acid suppression therapy. Last colonoscopy November 2011 by Dr. Deatra Ina with diminutive polyp and severe left-sided diverticulosis.  Sandra Cuevas is accompanied by her daughter today.  She complains of several months of slowly worsening intermittent lower abdominal pain with bloating and constipation.  She may feel the need for BM, but might just produce gas or a small thin stool.  She denies rectal bleeding.  She saw primary care in January, KUB done showing retained stool and otherwise normal.  She was prescribed stool softener and Gas-X, but did not feel it helped much.  She takes MiraLAX if she does not have a BM for several days.  ROS: Cardiovascular:  no chest pain Respiratory: no dyspnea  The patient's Past Medical, Family and Social History were reviewed and are on file in the EMR.  Objective:  Med list reviewed  Current Outpatient Medications:  .  acetaminophen (TYLENOL) 500 MG tablet, Take 2 tablets (1,000 mg total) by mouth every 6 (six) hours as needed., Disp: 30 tablet, Rfl: 0 .  albuterol (PROVENTIL) (5 MG/ML) 0.5% nebulizer solution, Take 0.5 mLs (2.5 mg total) by nebulization every 4 (four) hours as needed for wheezing or shortness of breath., Disp: 20 mL, Rfl: 12 .  amLODipine (NORVASC) 10 MG tablet, Take 1 tablet (10 mg total) by mouth daily., Disp: 30 tablet, Rfl: 0 .  budesonide-formoterol (SYMBICORT) 160-4.5 MCG/ACT inhaler, INHALE 2 PUFFS INTO THE LUNGS 2 TIMES A DAY., Disp: 10.2 g, Rfl: 5 .  fluticasone (FLONASE) 50 MCG/ACT nasal spray, Place 2 sprays into both nostrils daily., Disp: 16 g, Rfl: 2 .  gabapentin  (NEURONTIN) 300 MG capsule, Take 300 mg by mouth 2 (two) times daily. , Disp: , Rfl:  .  levothyroxine (SYNTHROID, LEVOTHROID) 50 MCG tablet, Take 50 mcg by mouth daily.  , Disp: , Rfl:  .  losartan (COZAAR) 50 MG tablet, Take 50 mg by mouth daily. , Disp: , Rfl:  .  oxyCODONE-acetaminophen (PERCOCET/ROXICET) 5-325 MG tablet, Take 1 tablet by mouth every 4 (four) hours as needed for moderate pain or severe pain., Disp: 30 tablet, Rfl: 0 .  pantoprazole (PROTONIX) 40 MG tablet, Take 1 tablet (40 mg total) by mouth daily., Disp: 30 tablet, Rfl: 2 .  polyethylene glycol (MIRALAX / GLYCOLAX) packet, Take 17 g by mouth daily. (Patient taking differently: Take 17 g by mouth daily as needed. ), Disp: 14 each, Rfl: 0 .  Polyvinyl Alcohol-Povidone (MURINE TEARS FOR DRY EYES) 5-6 MG/ML SOLN, Place 1 drop into both eyes daily as needed (Dry Eyes)., Disp: , Rfl:  .  potassium chloride (MICRO-K) 10 MEQ CR capsule, Take 10 mEq by mouth daily. , Disp: , Rfl:  .  zolpidem (AMBIEN) 5 MG tablet, Take 5 mg by mouth at bedtime. , Disp: , Rfl:  .  atorvastatin (LIPITOR) 40 MG tablet, Take 1 tablet (40 mg total) by mouth daily., Disp: 90 tablet, Rfl: 3   Vital signs in last 24 hrs: Vitals:   06/18/18 1605  BP: 134/68  Pulse: 72  Temp: 98.6 F (37 C)    Physical Exam Daughter present for entire visit  Well-appearing, pleasant and conversational  HEENT: sclera anicteric, oral mucosa moist without lesions  Neck: supple, no thyromegaly, JVD or lymphadenopathy  Cardiac: RRR without murmurs, S1S2 heard, no peripheral edema  Pulm: clear to auscultation bilaterally, normal RR and effort noted  Abdomen: soft, no tenderness, with active bowel sounds. No guarding or palpable hepatosplenomegaly.  Skin; warm and dry, no jaundice or rash Rectal: Decreased resting sphincter tone, copious soft stool in rectal vault, heme-negative, no palpable lesions or tenderness.  Recent Labs: No recent labs or abdominal imaging   @ASSESSMENTPLANBEGIN @ Assessment: Encounter Diagnoses  Name Primary?  . Lower abdominal pain Yes  . Chronic constipation   . Abdominal bloating    I suspect this is related to constipation from worsening diverticulosis with left colon tortuosity.  There is probably an element of age-related pelvic floor dysfunction as well.   Plan: I recommended she take the MiraLAX every other day to start.  If no real improvement, go up to daily use until she feels she has had a good evacuation, then dose adjust as needed.  I think the risks of a colonoscopy would outweigh the expected benefits at this point.  She will see me as needed. Total time 25 minutes, over half spent face-to-face with patient in counseling and coordination of care.   Nelida Meuse III   CC: Dr. Sharilyn Sites, [facility] ,[place], [place]

## 2018-06-19 ENCOUNTER — Other Ambulatory Visit: Payer: Self-pay | Admitting: Cardiovascular Disease

## 2018-06-19 NOTE — Telephone Encounter (Signed)
Outpatient Medication Detail    Disp Refills Start End   atorvastatin (LIPITOR) 40 MG tablet 30 tablet 0 06/19/2018    Sig - Route: Take 1 tablet (40 mg total) by mouth daily at 6 PM. - Oral   Sent to pharmacy as: atorvastatin (LIPITOR) 40 MG tablet   E-Prescribing Status: Receipt confirmed by pharmacy (06/19/2018 3:42 PM EDT)   Pharmacy   [facility] [phone] - [place], [address] AT [address]

## 2018-06-22 ENCOUNTER — Telehealth: Payer: Self-pay | Admitting: Cardiovascular Disease

## 2018-06-22 ENCOUNTER — Ambulatory Visit: Payer: PPO | Admitting: Cardiovascular Disease

## 2018-06-22 NOTE — Telephone Encounter (Signed)
Called pt about rescheduling due to the COVID 19 virus.  Left message to call back.

## 2018-06-23 DIAGNOSIS — H1031 Unspecified acute conjunctivitis, right eye: Secondary | ICD-10-CM | POA: Diagnosis not present

## 2018-06-23 DIAGNOSIS — H02403 Unspecified ptosis of bilateral eyelids: Secondary | ICD-10-CM | POA: Diagnosis not present

## 2018-06-25 ENCOUNTER — Other Ambulatory Visit: Payer: Self-pay

## 2018-06-25 ENCOUNTER — Ambulatory Visit: Payer: PPO | Admitting: Cardiovascular Disease

## 2018-06-25 ENCOUNTER — Encounter: Payer: Self-pay | Admitting: Cardiovascular Disease

## 2018-06-25 VITALS — BP 140/72 | HR 73 | Ht 66.0 in | Wt 217.8 lb

## 2018-06-25 DIAGNOSIS — R0989 Other specified symptoms and signs involving the circulatory and respiratory systems: Secondary | ICD-10-CM | POA: Diagnosis not present

## 2018-06-25 DIAGNOSIS — I5032 Chronic diastolic (congestive) heart failure: Secondary | ICD-10-CM

## 2018-06-25 DIAGNOSIS — I10 Essential (primary) hypertension: Secondary | ICD-10-CM | POA: Diagnosis not present

## 2018-06-25 NOTE — Patient Instructions (Signed)
Medication Instructions:  Your physician recommends that you continue on your current medications as directed. Please refer to the Current Medication list given to you today.  If you need a refill on your cardiac medications before your next appointment, please call your pharmacy.    Lab work: None Ordered   Testing/Procedures: Your physician has requested that you have a carotid duplex. This test is an ultrasound of the carotid arteries in your neck. It looks at blood flow through these arteries that supply the brain with blood. Allow one hour for this exam. There are no restrictions or special instructions.    Follow-Up: At [facility], you and your health needs are our priority.  As part of our continuing mission to provide you with exceptional heart care, we have created designated Provider Care Teams.  These Care Teams include your primary Cardiologist (physician) and Advanced Practice Providers (APPs -  Physician Assistants and Nurse Practitioners) who all work together to provide you with the care you need, when you need it. You will need a follow up appointment in:  6 months.  Please call our office 2 months in advance to schedule this appointment.  You may see Dr. Acie Fredrickson or one of the following Advanced Practice Providers on your designated Care Team: Richardson Dopp, PA-C [place], [place] . Daune Perch, NP

## 2018-06-25 NOTE — Progress Notes (Signed)
[name] Date of Birth  10-14-1928       [facility]    [employer] [address], [place], [place]  [address]   [place], [place]  [address] [phone]     [phone]   Fax  [phone]    Fax [phone]  Problem List: 1. Chest pain-normal stress Myoview study 2.  Chronic diastolic CHF - grade 2 DD  3. Hypertension 4. Diabetes Mellitus 5. Carotid bruit  IMPRESSION: Less than 50% stenosis in the right internal carotid artery with some plaque.  Stable 50-69% stenosis in the left internal carotid artery with significant calcified plaque.  Increasing velocity in the left external carotid artery compatible with significant narrowing.  6. RBBB  History of Present Illness: Mrs. Kovacevic is an 85 year old female with a history of atypical chest pains. She had a negative stress Myoview study in March of 2012. She has a history of asthma and uses albuterol on an as-needed basis. She continues to have shortness of breath with exertion. He also has a history of hypertension.  She complains of chronic dyspnea.   She also has lots of back pain.  Dec. 16, 2014:  Ms. Wallman is doing well from a cardiac standpoint. She's having lots of noncardiac problems. She's having problems with her bowels. She has lots of neck pain. She had an MRI performed several weeks ago which reveals significant cervical arthritis and disc disease. Her blood pressure has been normal.  She also has lots of asthma. She has used her inhalers on a regular basis.  Dec. 17, 2015:  Ms. Puerta is a 85 yo with hx of noncardiac CP,  HTN, carotid bruit, and DM.  She may have some sinus isues, hoarsness and scratchy throat.   Jan. 10, 2017:  Oveall doing well. Has occasional intrascapular pain 3 weeks ago.   Lasted about 2 weeks.  Was worse with movement . Has resolved how.   Jan. 23, 2018:  No cardiac issues.   Seen with daughter, [name] .  Has  back and hip issues.   No CP or dyspnea.   Does her normal activities. Does have occasional dyspnea and takes a Lasix to resolve this  Takes Lasix every 2-3 weeks at the most.   Has grade 2 diastolic dysfunction .   June 16, 2017:  Ms. Shippee is seen back today for follow-up of her chronic diastolic congestive heart failure, hypertension.  She is been seen in the emergency room on several occasions for chest pain.  She had the flu this past winter .    June 25, 2018:  Overall she is ding welll Seen for follow up of chronic diastoic CHF, HTN  No CP no dyspnea No cough or fever .      Current Outpatient Medications on File Prior to Visit  Medication Sig Dispense Refill  . acetaminophen (TYLENOL) 500 MG tablet Take 2 tablets (1,000 mg total) by mouth every 6 (six) hours as needed. 30 tablet 0  . albuterol (PROVENTIL) (5 MG/ML) 0.5% nebulizer solution Take 0.5 mLs (2.5 mg total) by nebulization every 4 (four) hours as needed for wheezing or shortness of breath. 20 mL 12  . amLODipine (NORVASC) 10 MG tablet Take 1 tablet (10 mg total) by mouth daily. 30 tablet 0  . atorvastatin (LIPITOR) 40 MG tablet Take 1 tablet (40 mg total) by mouth daily at 6 PM. 30 tablet 0  . budesonide-formoterol (SYMBICORT)  160-4.5 MCG/ACT inhaler INHALE 2 PUFFS INTO THE LUNGS 2 TIMES A DAY. 10.2 g 5  . fluticasone (FLONASE) 50 MCG/ACT nasal spray Place 2 sprays into both nostrils daily. 16 g 2  . gabapentin (NEURONTIN) 300 MG capsule Take 300 mg by mouth 2 (two) times daily.     Marland Kitchen levothyroxine (SYNTHROID, LEVOTHROID) 50 MCG tablet Take 50 mcg by mouth daily.      Marland Kitchen losartan (COZAAR) 50 MG tablet Take 50 mg by mouth daily.     Marland Kitchen oxyCODONE-acetaminophen (PERCOCET/ROXICET) 5-325 MG tablet Take 1 tablet by mouth every 4 (four) hours as needed for moderate pain or severe pain. 30 tablet 0  . pantoprazole (PROTONIX) 40 MG tablet Take 1 tablet (40 mg total) by mouth daily. 30 tablet 2  . polyethylene glycol (MIRALAX  / GLYCOLAX) packet Take 17 g by mouth daily. 14 each 0  . Polyvinyl Alcohol-Povidone (MURINE TEARS FOR DRY EYES) 5-6 MG/ML SOLN Place 1 drop into both eyes daily as needed (Dry Eyes).    . potassium chloride (MICRO-K) 10 MEQ CR capsule Take 10 mEq by mouth daily.     Marland Kitchen zolpidem (AMBIEN) 5 MG tablet Take 5 mg by mouth at bedtime.      No current facility-administered medications on file prior to visit.     No Known Allergies  Past Medical History:  Diagnosis Date  . Arthritis   . Asthma   . Atypical chest pain   . 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 Diabetes mellitus   . GERD (gastroesophageal reflux disease)   . History of cardiovascular stress test 06/01/2010   EF 71% - no evidence of ischemia, normal left ventricular systolic function  . History of hysterectomy 1965  . Hyperlipidemia   . Hypertension   . Hypothyroidism   . Shortness of breath    on exertion  . Tubular adenoma of colon     Past Surgical History:  Procedure Laterality Date  . ABDOMINAL HYSTERECTOMY    . CARDIAC SURGERY     catherization  . HEMORRHOID SURGERY    . TOTAL SHOULDER REPLACEMENT Right     Social History   Tobacco Use  Smoking Status Never Smoker  Smokeless Tobacco Never Used    Social History   Substance and Sexual Activity  Alcohol Use No    Family History  Problem Relation Age of Onset  . Heart disease Father        heart problems  . Kidney failure Mother   . Diabetes Mother   . Leukemia Brother   . Diabetes Brother   . Colon cancer Sister   . Leukemia Brother   . Diabetes Brother   . Pancreatic cancer Brother   . Bone cancer Sister     Reviw of Systems:     Physical Exam: Blood pressure 140/72, pulse 73, height 5\' 6"  (1.676 m), weight 217 lb 12.8 oz (98.8 kg), SpO2 91 %.  GEN:  Well nourished, well developed in no acute distress HEENT: Normal NECK: No JVD;  Left carotid bruit  LYMPHATICS: No lymphadenopathy CARDIAC: RRR , no murmurs, rubs,  gallops RESPIRATORY:  Clear to auscultation without rales, wheezing or rhonchi  ABDOMEN: Soft, non-tender, non-distended MUSCULOSKELETAL:  No edema; No deformity  SKIN: Warm and dry NEUROLOGIC:  Alert and oriented x 3    ECG:   June 25, 2018: Normal sinus rhythm at 73 beats minute.  Occasional premature ventricular contractions.  Right  bundle branch block    Assessment / Plan:   1. Chest pain-   No recent CP   2. Chronic diastolic CHF -   No dyspnea ,  3. Hypertension -   Advised restricting salt   4. Diabetes Mellitus -     5. Carotid bruit -    Has a soft left carotid bruit.   Is scheduled for repeat carotid scan .   Will get it in several months   6.  Hyperlipidemia :   On atorvastatin 10 ,  She had recent labs at her primary MD office She will have labs faxed in.      Mertie Moores, MD  06/25/2018 11:33 AM    [facility] [place],  [place], [place]  [address] Pager [phone] Phone: [phone]; Fax: [phone]

## 2018-07-07 ENCOUNTER — Ambulatory Visit: Payer: PPO | Admitting: Emergency Medicine

## 2018-07-11 ENCOUNTER — Other Ambulatory Visit: Payer: Self-pay | Admitting: Emergency Medicine

## 2018-07-16 ENCOUNTER — Other Ambulatory Visit: Payer: Self-pay | Admitting: Cardiovascular Disease

## 2018-08-18 ENCOUNTER — Encounter (HOSPITAL_COMMUNITY): Payer: PPO

## 2018-09-10 ENCOUNTER — Encounter (INDEPENDENT_AMBULATORY_CARE_PROVIDER_SITE_OTHER): Payer: PPO | Admitting: Ophthalmology

## 2018-09-10 DIAGNOSIS — L821 Other seborrheic keratosis: Secondary | ICD-10-CM | POA: Diagnosis not present

## 2018-09-10 DIAGNOSIS — H02831 Dermatochalasis of right upper eyelid: Secondary | ICD-10-CM | POA: Diagnosis not present

## 2018-09-10 DIAGNOSIS — H53483 Generalized contraction of visual field, bilateral: Secondary | ICD-10-CM | POA: Diagnosis not present

## 2018-09-10 DIAGNOSIS — H02413 Mechanical ptosis of bilateral eyelids: Secondary | ICD-10-CM | POA: Diagnosis not present

## 2018-09-10 DIAGNOSIS — H57813 Brow ptosis, bilateral: Secondary | ICD-10-CM | POA: Diagnosis not present

## 2018-09-10 DIAGNOSIS — H0279 Other degenerative disorders of eyelid and periocular area: Secondary | ICD-10-CM | POA: Diagnosis not present

## 2018-09-10 DIAGNOSIS — H02423 Myogenic ptosis of bilateral eyelids: Secondary | ICD-10-CM | POA: Diagnosis not present

## 2018-09-10 DIAGNOSIS — H02834 Dermatochalasis of left upper eyelid: Secondary | ICD-10-CM | POA: Diagnosis not present

## 2018-09-11 DIAGNOSIS — H53482 Generalized contraction of visual field, left eye: Secondary | ICD-10-CM | POA: Diagnosis not present

## 2018-09-11 DIAGNOSIS — H53483 Generalized contraction of visual field, bilateral: Secondary | ICD-10-CM | POA: Diagnosis not present

## 2018-09-11 DIAGNOSIS — H53481 Generalized contraction of visual field, right eye: Secondary | ICD-10-CM | POA: Diagnosis not present

## 2018-09-24 ENCOUNTER — Other Ambulatory Visit: Payer: Self-pay

## 2018-09-24 ENCOUNTER — Encounter (INDEPENDENT_AMBULATORY_CARE_PROVIDER_SITE_OTHER): Payer: PPO | Admitting: Ophthalmology

## 2018-09-24 DIAGNOSIS — H35033 Hypertensive retinopathy, bilateral: Secondary | ICD-10-CM | POA: Diagnosis not present

## 2018-09-24 DIAGNOSIS — H43813 Vitreous degeneration, bilateral: Secondary | ICD-10-CM | POA: Diagnosis not present

## 2018-09-24 DIAGNOSIS — I10 Essential (primary) hypertension: Secondary | ICD-10-CM

## 2018-09-24 DIAGNOSIS — H353231 Exudative age-related macular degeneration, bilateral, with active choroidal neovascularization: Secondary | ICD-10-CM

## 2018-10-08 ENCOUNTER — Other Ambulatory Visit: Payer: Self-pay

## 2018-10-08 ENCOUNTER — Ambulatory Visit: Payer: PPO | Admitting: Emergency Medicine

## 2018-10-08 ENCOUNTER — Encounter: Payer: Self-pay | Admitting: Emergency Medicine

## 2018-10-08 DIAGNOSIS — R911 Solitary pulmonary nodule: Secondary | ICD-10-CM

## 2018-10-08 DIAGNOSIS — J449 Chronic obstructive pulmonary disease, unspecified: Secondary | ICD-10-CM | POA: Diagnosis not present

## 2018-10-08 DIAGNOSIS — R509 Fever, unspecified: Secondary | ICD-10-CM | POA: Diagnosis not present

## 2018-10-08 DIAGNOSIS — R05 Cough: Secondary | ICD-10-CM

## 2018-10-08 DIAGNOSIS — R059 Cough, unspecified: Secondary | ICD-10-CM

## 2018-10-08 NOTE — Assessment & Plan Note (Signed)
No longer following with interval CT scans.  We may decide to do so if she has a clinical change.

## 2018-10-08 NOTE — Assessment & Plan Note (Signed)
Low-grade fever reported on presentation today.  She has no viral symptoms, no constitutional symptoms or complaints.  I think the chances that she has a viral process are very small.  All contact precautions were made per our protocol.

## 2018-10-08 NOTE — Assessment & Plan Note (Signed)
Asymptomatic.  She tolerated discontinuation of Symbicort, no intention to restart at this time.  She has albuterol available to use as needed but barely uses it.  Continue to follow clinically

## 2018-10-08 NOTE — Assessment & Plan Note (Signed)
Stable at this time.  Continue guaifenesin as she needs it

## 2018-10-08 NOTE — Progress Notes (Signed)
Subjective:    Patient ID: Sandra Cuevas, female    DOB: 05-09-1928, 85 y.o.   MRN: [id number]  HPI 85 year old woman, never smoker, history of hypertension, hyperlipidemia, hypothyroidism, COPD on the basis of chronic asthma. The dx of asthma was first was made in late teens. She was treated initially but she no longer needed meds when she stopped working in [employer].  Pulmonary function testing performed 04/20/13 was reviewed by me today. This shows moderate obstruction without a bronchodilator response. She has been managed on Symbicort twice a day and albuterol when necessary. She is on Dexilant bid, is not using any allergy regimen at this time. She has a chronic hoarse voice, has undergone laryngoscopy before. She is active, is able to exert herself. She has nebs and albuterol HFA to use prn but rarely needs it. No cough. Rare wheeze. No CP.    ROV 09/05/15 -- follow-up visit for COPD in the setting of chronic fixed asthma.also with a chronic hoarse voice. She has seen ENT before - no pathology seen. She is on dexilant. No flares. She has albuterol available to use prn, has not needed it. On scheduled Symbicort. She is having a lot of back trouble that is limiting her activity, not her breathing. No real cough. Her hoarse voice is stable.   ROV 07/08/17 --this is a follow-up visit for patient with a history of fixed asthma and upper airway irritation syndrome in the setting of GERD/hiatal hernia.  She has chronic hoarseness.  She also has a right lower lobe 6 mm pulmonary nodule.  She returns today reporting that she began to have more difficulty with sinus drainage, congestion, clear nasal drainge. Cough prod of yellow mucous. No fevers, some increase in dyspnea. She is on Symbicort bid. She started robitussin and phenylephrine.   ROV 11/17/17 --this pleasant 85 year old woman with a history of upper airway irritation syndrome, fixed asthma both influenced by her GERD and hiatal hernia.  She has  chronic hoarseness.  We have also been following a 6 mm right lower lobe pulmonary nodule.  At her last visit in April we treated her for an acute exacerbation with prednisone and azithromycin.  We also added fluticasone nasal spray.  Her chronic bronchodilator is Symbicort. She continues to have intermittent hoarseness. She is on protonix, no breakthrough. She is on flonase prn.   ROV 04/07/18 --follow-up visit, history of upper airway irritation syndrome and fixed asthma.  She has GERD and a hiatal hernia that influence both of these.  We have been following a 6 mm right lower lobe pulmonary nodule, last CT was 07/14/2016.  Given her age and the slow change we have deferred repeat CT so far. Since last time we held her Symbicort to see if this would impact her throat irritation and hoarseness. She has decreased it to once most evenings. She is coughing up thick mucous every am, white / clear. She rarely uses albuterol, has nebs and HFA.   ROV 10/08/2018 --[name] is 85, has a history of upper airway irritation syndrome and fixed asthma in the setting of GERD with a hiatal hernia.  We have also been following a 6 mm right lower lobe pulmonary nodule.  Her most recent CT was done 07/14/2016 and we decided not to repeat at this time.  At her last visit we stopped Symbicort to see if she would tolerate, started guaifenesin given symptoms of increased throat irritation and cough. She hasn't missed it. She states that  she has been doing well. She is having some constipation. She has chronic hoarseness, unchanged. She is not having SOB. She never uses albuterol, never needs it.   On screening eval today her T was 99.26F. she states that she feels well. She travelled to to the [place] with her daughter and grandchildren last week. No one else has been sick. No reason to believe she has a viral process.    Review of Systems As per HPI      Objective:   Physical Exam Vitals:   10/08/18 1607  BP: 120/82  Pulse:  74  Temp: 99.7 F (37.6 C)  TempSrc: Oral  SpO2: 90%  Weight: 207 lb 6.4 oz (94.1 kg)   Gen: Pleasant, well-nourished, in no distress,  normal affect  ENT: No lesions,  mouth clear,  oropharynx clear, nasal congestion and nasal voice  Neck: No JVD, no stridor but she is hoarse.   Lungs: No use of accessory muscles, clear without rales or rhonchi, no wheeze.   Cardiovascular: RRR, heart sounds normal, no murmur or gallops, no peripheral edema  Musculoskeletal: No deformities  Neuro: alert, non focal  Skin: Warm, no lesions or rashes      Assessment & Plan:  Cough Stable at this time.  Continue guaifenesin as she needs it  Chronic obstructive airway disease with asthma Asymptomatic.  She tolerated discontinuation of Symbicort, no intention to restart at this time.  She has albuterol available to use as needed but barely uses it.  Continue to follow clinically  Pulmonary nodule, right No longer following with interval CT scans.  We may decide to do so if she has a clinical change.  Fever Low-grade fever reported on presentation today.  She has no viral symptoms, no constitutional symptoms or complaints.  I think the chances that she has a viral process are very small.  All contact precautions were made per our protocol.  Baltazar Apo, MD, PhD 10/08/2018, 4:17 PM [facility] [phone] or if no answer [phone]

## 2018-10-08 NOTE — Patient Instructions (Addendum)
We will not restart your Symbicort at this time. Keep your albuterol available use 1 nebulizer treatment if needed for shortness of breath, chest tightness, wheezing. Call our office if you develop any fever, chills, aches, pains, shortness of breath. Follow with Dr Lamonte Sakai if needed

## 2018-10-20 DIAGNOSIS — H02831 Dermatochalasis of right upper eyelid: Secondary | ICD-10-CM | POA: Diagnosis not present

## 2018-10-20 DIAGNOSIS — H53483 Generalized contraction of visual field, bilateral: Secondary | ICD-10-CM | POA: Diagnosis not present

## 2018-10-20 DIAGNOSIS — H53453 Other localized visual field defect, bilateral: Secondary | ICD-10-CM | POA: Diagnosis not present

## 2018-10-20 DIAGNOSIS — H02413 Mechanical ptosis of bilateral eyelids: Secondary | ICD-10-CM | POA: Diagnosis not present

## 2018-10-20 DIAGNOSIS — H0279 Other degenerative disorders of eyelid and periocular area: Secondary | ICD-10-CM | POA: Diagnosis not present

## 2018-10-20 DIAGNOSIS — H57813 Brow ptosis, bilateral: Secondary | ICD-10-CM | POA: Diagnosis not present

## 2018-10-20 DIAGNOSIS — H02423 Myogenic ptosis of bilateral eyelids: Secondary | ICD-10-CM | POA: Diagnosis not present

## 2018-10-20 DIAGNOSIS — H02834 Dermatochalasis of left upper eyelid: Secondary | ICD-10-CM | POA: Diagnosis not present

## 2018-10-20 DIAGNOSIS — L821 Other seborrheic keratosis: Secondary | ICD-10-CM | POA: Diagnosis not present

## 2018-10-21 ENCOUNTER — Inpatient Hospital Stay (HOSPITAL_COMMUNITY): Admission: RE | Admit: 2018-10-21 | Payer: PPO | Source: Ambulatory Visit

## 2018-11-17 ENCOUNTER — Other Ambulatory Visit: Payer: Self-pay

## 2018-11-17 ENCOUNTER — Ambulatory Visit (HOSPITAL_COMMUNITY)
Admission: RE | Admit: 2018-11-17 | Discharge: 2018-11-17 | Disposition: A | Discharge status: Home / Self Care | Payer: PPO | Source: Ambulatory Visit | Attending: Cardiovascular Disease | Admitting: Cardiovascular Disease

## 2018-11-17 ENCOUNTER — Other Ambulatory Visit (HOSPITAL_COMMUNITY): Payer: Self-pay | Admitting: Cardiovascular Disease

## 2018-11-17 DIAGNOSIS — I6529 Occlusion and stenosis of unspecified carotid artery: Secondary | ICD-10-CM | POA: Diagnosis not present

## 2018-11-17 DIAGNOSIS — I6523 Occlusion and stenosis of bilateral carotid arteries: Secondary | ICD-10-CM

## 2018-12-06 ENCOUNTER — Other Ambulatory Visit: Payer: Self-pay | Admitting: Emergency Medicine

## 2018-12-22 ENCOUNTER — Emergency Department (HOSPITAL_COMMUNITY): Payer: PPO

## 2018-12-22 ENCOUNTER — Other Ambulatory Visit: Payer: Self-pay

## 2018-12-22 ENCOUNTER — Encounter (HOSPITAL_COMMUNITY): Payer: Self-pay | Admitting: Emergency Medicine

## 2018-12-22 ENCOUNTER — Emergency Department (HOSPITAL_COMMUNITY)
Admission: EM | Admit: 2018-12-22 | Discharge: 2018-12-22 | Disposition: A | Discharge status: Home / Self Care | Payer: PPO | Attending: Emergency Medicine | Admitting: Emergency Medicine

## 2018-12-22 DIAGNOSIS — J45909 Unspecified asthma, uncomplicated: Secondary | ICD-10-CM | POA: Insufficient documentation

## 2018-12-22 DIAGNOSIS — R0789 Other chest pain: Secondary | ICD-10-CM | POA: Insufficient documentation

## 2018-12-22 DIAGNOSIS — R0989 Other specified symptoms and signs involving the circulatory and respiratory systems: Secondary | ICD-10-CM | POA: Diagnosis not present

## 2018-12-22 DIAGNOSIS — E039 Hypothyroidism, unspecified: Secondary | ICD-10-CM | POA: Insufficient documentation

## 2018-12-22 DIAGNOSIS — E1122 Type 2 diabetes mellitus with diabetic chronic kidney disease: Secondary | ICD-10-CM | POA: Insufficient documentation

## 2018-12-22 DIAGNOSIS — N182 Chronic kidney disease, stage 2 (mild): Secondary | ICD-10-CM | POA: Insufficient documentation

## 2018-12-22 DIAGNOSIS — I130 Hypertensive heart and chronic kidney disease with heart failure and stage 1 through stage 4 chronic kidney disease, or unspecified chronic kidney disease: Secondary | ICD-10-CM | POA: Diagnosis not present

## 2018-12-22 DIAGNOSIS — I5032 Chronic diastolic (congestive) heart failure: Secondary | ICD-10-CM | POA: Diagnosis not present

## 2018-12-22 DIAGNOSIS — Z79899 Other long term (current) drug therapy: Secondary | ICD-10-CM | POA: Insufficient documentation

## 2018-12-22 DIAGNOSIS — R918 Other nonspecific abnormal finding of lung field: Secondary | ICD-10-CM | POA: Diagnosis not present

## 2018-12-22 DIAGNOSIS — I517 Cardiomegaly: Secondary | ICD-10-CM | POA: Diagnosis not present

## 2018-12-22 LAB — CBC WITH DIFFERENTIAL/PLATELET
Abs Immature Granulocytes: 0.04 10*3/uL (ref 0.00–0.07)
Basophils Absolute: 0 10*3/uL (ref 0.0–0.1)
Basophils Relative: 0 %
Eosinophils Absolute: 0.2 10*3/uL (ref 0.0–0.5)
Eosinophils Relative: 2 %
HCT: 36.7 % (ref 36.0–46.0)
Hemoglobin: 11.3 g/dL — ABNORMAL LOW (ref 12.0–15.0)
Immature Granulocytes: 1 %
Lymphocytes Relative: 24 %
Lymphs Abs: 1.8 10*3/uL (ref 0.7–4.0)
MCH: 30.5 pg (ref 26.0–34.0)
MCHC: 30.8 g/dL (ref 30.0–36.0)
MCV: 99.2 fL (ref 80.0–100.0)
Monocytes Absolute: 0.9 10*3/uL (ref 0.1–1.0)
Monocytes Relative: 12 %
Neutro Abs: 4.5 10*3/uL (ref 1.7–7.7)
Neutrophils Relative %: 61 %
Platelets: 176 10*3/uL (ref 150–400)
RBC: 3.7 MIL/uL — ABNORMAL LOW (ref 3.87–5.11)
RDW: 14.8 % (ref 11.5–15.5)
WBC: 7.4 10*3/uL (ref 4.0–10.5)
nRBC: 0 % (ref 0.0–0.2)

## 2018-12-22 LAB — COMPREHENSIVE METABOLIC PANEL
ALT: 30 U/L (ref 0–44)
AST: 30 U/L (ref 15–41)
Albumin: 3.5 g/dL (ref 3.5–5.0)
Alkaline Phosphatase: 85 U/L (ref 38–126)
Anion gap: 8 (ref 5–15)
BUN: 13 mg/dL (ref 8–23)
CO2: 30 mmol/L (ref 22–32)
Calcium: 8.9 mg/dL (ref 8.9–10.3)
Chloride: 101 mmol/L (ref 98–111)
Creatinine, Ser: 1.11 mg/dL — ABNORMAL HIGH (ref 0.44–1.00)
GFR calc Af Amer: 51 mL/min — ABNORMAL LOW (ref >60)
GFR calc non Af Amer: 44 mL/min — ABNORMAL LOW (ref >60)
Glucose, Bld: 166 mg/dL — ABNORMAL HIGH (ref 70–99)
Potassium: 4.2 mmol/L (ref 3.5–5.1)
Sodium: 139 mmol/L (ref 135–145)
Total Bilirubin: 0.8 mg/dL (ref 0.3–1.2)
Total Protein: 6.5 g/dL (ref 6.5–8.1)

## 2018-12-22 LAB — TROPONIN I (HIGH SENSITIVITY)
Troponin I (High Sensitivity): 10 ng/L (ref <18)
Troponin I (High Sensitivity): 11 ng/L (ref <18)

## 2018-12-22 MED ORDER — TRAMADOL HCL 50 MG PO TABS: 50.0000 mg | ORAL_TABLET | Freq: Four times a day (QID) | ORAL | 0 refills | Status: DC | PRN

## 2018-12-22 MED ORDER — NITROGLYCERIN 0.4 MG SL SUBL
0.4000 mg | SUBLINGUAL_TABLET | SUBLINGUAL | Status: DC | PRN
  Administered 2018-12-22: 0.4 mg via SUBLINGUAL
  Filled 2018-12-22: qty 1

## 2018-12-22 NOTE — ED Provider Notes (Signed)
[facility] EMERGENCY DEPARTMENT Provider Note   CSN: [id number] Arrival date & time: 12/22/18  1606     History   Chief Complaint Chief Complaint  Patient presents with  . Chest Pain    HPI Sandra Cuevas is a 85 y.o. female.     Patient complains of pain left chest worse with movement.  No sweating no shortness of breath  The history is provided by the patient. No language interpreter was used.  Chest Pain Pain location:  L chest Pain quality: aching   Pain radiates to:  Does not radiate Pain severity:  Mild Onset quality:  Sudden Timing:  Constant Progression:  Unchanged Chronicity:  New Context: not breathing   Relieved by:  Nothing Worsened by:  Nothing Associated symptoms: no abdominal pain, no back pain, no cough, no fatigue and no headache     Past Medical History:  Diagnosis Date  . Arthritis   . Asthma   . Atypical chest pain   . 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 Diabetes mellitus   . GERD (gastroesophageal reflux disease)   . History of cardiovascular stress test 06/01/2010   EF 71% - no evidence of ischemia, normal left ventricular systolic function  . History of hysterectomy 1965  . Hyperlipidemia   . Hypertension   . Hypothyroidism   . Shortness of breath    on exertion  . Tubular adenoma of colon     Patient Active Problem List   Diagnosis Date Noted  . Fever 10/08/2018  . Chronic rhinitis 11/17/2017  . Pulmonary nodule, right 03/10/2017  . GERD (gastroesophageal reflux disease) 10/14/2016  . 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 Hypertensive urgency 07/14/2016  . Chest pain 07/14/2016  . Diabetes mellitus, type II ([place]) 07/14/2016  . CKD (chronic kidney disease), stage II 07/14/2016  . Cellulitis 07/12/2016  . Cellulitis of right leg 07/12/2016  . Chronic diastolic CHF (congestive heart failure) ([place]) 04/30/2016  . Hoarse voice quality 09/05/2015  . Chronic obstructive airway disease with asthma ([place])  04/20/2013  . Snoring 04/20/2013  . Premature ventricular contractions 03/23/2013  . Acute diastolic heart failure ([place]) 02/18/2013  . Change in stool 01/06/2012  . Shortness of breath 09/26/2011  . Carpal tunnel syndrome 11/06/2010  . Hyperlipidemia   . Atypical chest pain   . Hypothyroidism   . Diabetes mellitus   . EXTERNAL HEMORRHOIDS WITHOUT MENTION COMP 02/12/2010  . RECTAL BLEEDING 02/12/2010  . CERVICAL CANCER 10/18/2009  . Dyslipidemia 10/18/2009  . Benign essential HTN 10/18/2009  . Cough 10/18/2009    Past Surgical History:  Procedure Laterality Date  . ABDOMINAL HYSTERECTOMY    . CARDIAC SURGERY     catherization  . HEMORRHOID SURGERY    . TOTAL SHOULDER REPLACEMENT Right      OB History   No obstetric history on file.      Home Medications    Prior to Admission medications   Medication Sig Start Date End Date Taking? Authorizing Provider  acetaminophen (TYLENOL) 500 MG tablet Take 2 tablets (1,000 mg total) by mouth every 6 (six) hours as needed. 07/05/16  Yes Charlesetta Shanks, MD  albuterol (PROVENTIL) (5 MG/ML) 0.5% nebulizer solution Take 0.5 mLs (2.5 mg total) by nebulization every 4 (four) hours as needed for wheezing or shortness of breath. 02/25/13  Yes Ghimire, Henreitta Leber, MD  amLODipine (NORVASC) 10 MG tablet Take 1 tablet (10 mg total) by mouth daily. 07/21/16  Yes Nita Sells, MD  atorvastatin (LIPITOR) 40 MG tablet TAKE 1 TABLET(40 MG) BY MOUTH DAILY AT 6 PM Patient taking differently: Take 40 mg by mouth daily at 6 PM.  07/16/18  Yes Nahser, Wonda Cheng, MD  fluticasone (FLONASE) 50 MCG/ACT nasal spray SHAKE LIQUID AND USE 2 SPRAYS IN EACH NOSTRIL DAILY Patient taking differently: Place 2 sprays into both nostrils daily.  12/07/18  Yes Collene Gobble, MD  hydrALAZINE (APRESOLINE) 25 MG tablet Take 25 mg by mouth 4 (four) times daily.   Yes [provider]  levothyroxine (SYNTHROID, LEVOTHROID) 50 MCG tablet Take 50 mcg by mouth daily.      Yes [provider]  montelukast (SINGULAIR) 10 MG tablet Take 10 mg by mouth daily. 11/10/18  Yes [provider]  polyethylene glycol (MIRALAX / GLYCOLAX) packet Take 17 g by mouth daily. Patient taking differently: Take 17 g by mouth daily as needed for mild constipation.  07/21/16  Yes Nita Sells, MD  Polyvinyl Alcohol-Povidone (MURINE TEARS FOR DRY EYES) 5-6 MG/ML SOLN Place 1 drop into both eyes daily as needed (Dry Eyes).   Yes [provider]  potassium chloride (MICRO-K) 10 MEQ CR capsule Take 10 mEq by mouth daily.  08/15/10  Yes [provider]  zolpidem (AMBIEN) 5 MG tablet Take 2.5 mg by mouth at bedtime.  08/17/16  Yes [provider]  traMADol (ULTRAM) 50 MG tablet Take 1 tablet (50 mg total) by mouth every 6 (six) hours as needed. 12/22/18   Milton Ferguson, MD    Family History Family History  Problem Relation Age of Onset  . Heart disease Father        heart problems  . Kidney failure Mother   . Diabetes Mother   . Leukemia Brother   . Diabetes Brother   . Colon cancer Sister   . Leukemia Brother   . Diabetes Brother   . Pancreatic cancer Brother   . Bone cancer Sister     Social History Social History   Tobacco Use  . Smoking status: Never Smoker  . Smokeless tobacco: Never Used  Substance Use Topics  . Alcohol use: No  . Drug use: No     Allergies   Patient has no known allergies.   Review of Systems Review of Systems  Constitutional: Negative for appetite change and fatigue.  HENT: Negative for congestion, ear discharge and sinus pressure.   Eyes: Negative for discharge.  Respiratory: Negative for cough.   Cardiovascular: Positive for chest pain.  Gastrointestinal: Negative for abdominal pain and diarrhea.  Genitourinary: Negative for frequency and hematuria.  Musculoskeletal: Negative for back pain.  Skin: Negative for rash.  Neurological: Negative for seizures and headaches.   Psychiatric/Behavioral: Negative for hallucinations.     Physical Exam Updated Vital Signs BP (!) 153/61   Pulse 69   Temp 98.5 F (36.9 C) (Oral)   Resp 15   Ht 5\' 7"  (1.702 m)   Wt 90.7 kg   SpO2 91%   BMI 31.32 kg/m   Physical Exam Vitals signs and nursing note reviewed.  Constitutional:      Appearance: She is well-developed.  HENT:     Head: Normocephalic.     Nose: Nose normal.  Eyes:     General: No scleral icterus.    Conjunctiva/sclera: Conjunctivae normal.  Neck:     Musculoskeletal: Neck supple.     Thyroid: No thyromegaly.  Cardiovascular:     Rate and Rhythm: Normal  rate and regular rhythm.     Heart sounds: No murmur. No friction rub. No gallop.      Comments: Tender left chest Pulmonary:     Breath sounds: No stridor. No wheezing or rales.  Chest:     Chest wall: No tenderness.  Abdominal:     General: There is no distension.     Tenderness: There is no abdominal tenderness. There is no rebound.  Musculoskeletal: Normal range of motion.  Lymphadenopathy:     Cervical: No cervical adenopathy.  Skin:    Findings: No erythema or rash.  Neurological:     Mental Status: She is oriented to person, place, and time.     Motor: No abnormal muscle tone.     Coordination: Coordination normal.  Psychiatric:        Behavior: Behavior normal.      ED Treatments / Results  Labs (all labs ordered are listed, but only abnormal results are displayed) Labs Reviewed  CBC WITH DIFFERENTIAL/PLATELET - Abnormal; Notable for the following components:      Result Value   RBC 3.70 (*)    Hemoglobin 11.3 (*)    All other components within normal limits  COMPREHENSIVE METABOLIC PANEL - Abnormal; Notable for the following components:   Glucose, Bld 166 (*)    Creatinine, Ser 1.11 (*)    GFR calc non Af Amer 44 (*)    GFR calc Af Amer 51 (*)    All other components within normal limits  TROPONIN I (HIGH SENSITIVITY)  TROPONIN I (HIGH SENSITIVITY)    EKG  EKG Interpretation  Date/Time:  Tuesday December 22 2018 17:35:59 EDT Ventricular Rate:  71 PR Interval:    QRS Duration: 158 QT Interval:  472 QTC Calculation: 513 R Axis:   119 Text Interpretation:  Sinus rhythm RBBB and LPFB Confirmed by Milton Ferguson [id number]) on 12/22/2018 8:31:36 PM   Radiology Dg Chest Portable 1 View  Result Date: 12/22/2018 CLINICAL DATA:  Weakness EXAM: PORTABLE CHEST 1 VIEW COMPARISON:  07/14/2016 FINDINGS: Partially visualized right shoulder replacement. Hazy lower lung fields likely overlying soft tissues. Mild cardiomegaly with central vascular congestion and aortic atherosclerosis. No pneumothorax. IMPRESSION: Hazy lower lung zones, suspect largely related to overlying soft tissues. Mild cardiomegaly with central vascular congestion Electronically Signed   By: Donavan Foil M.D.   On: 12/22/2018 18:18    Procedures Procedures (including critical care time)  Medications Ordered in ED Medications  nitroGLYCERIN (NITROSTAT) SL tablet 0.4 mg (0.4 mg Sublingual Given 12/22/18 1831)     Initial Impression / Assessment and Plan / ED Course  I have reviewed the triage vital signs and the nursing notes.  Pertinent labs & imaging results that were available during my care of the patient were reviewed by me and considered in my medical decision making (see chart for details).        Labs including 2 troponins negative.  Patient with atypical chest pain most likely musculoskeletal.  Nitroglycerin did not help it.  Patient given some Ultram will follow-up with her doctor  Final Clinical Impressions(s) / ED Diagnoses   Final diagnoses:  Atypical chest pain    ED Discharge Orders         Ordered    traMADol (ULTRAM) 50 MG tablet  Every 6 hours PRN     12/22/18 2038           Milton Ferguson, MD 12/22/18 2042

## 2018-12-22 NOTE — ED Notes (Signed)
Pt denies pain but reports pressure  Also reports NTG did not help nor change how she felt

## 2018-12-22 NOTE — ED Notes (Signed)
Daughter:  [phone]

## 2018-12-22 NOTE — Discharge Instructions (Addendum)
Follow-up with your family doctor next week for recheck.  Return sooner if any problems.

## 2018-12-22 NOTE — ED Notes (Signed)
Ambulated to BR without assistance.  Gait steady.

## 2018-12-22 NOTE — ED Triage Notes (Signed)
C/o chest pain for last 3 weeks.  C/o pain when raising arms at shoulder lever.  Denies SOB, cough or fever.

## 2019-01-22 ENCOUNTER — Other Ambulatory Visit: Payer: Self-pay

## 2019-01-22 DIAGNOSIS — Z20828 Contact with and (suspected) exposure to other viral communicable diseases: Secondary | ICD-10-CM | POA: Diagnosis not present

## 2019-01-22 DIAGNOSIS — Z20822 Contact with and (suspected) exposure to covid-19: Secondary | ICD-10-CM

## 2019-01-23 LAB — NOVEL CORONAVIRUS, NAA: SARS-CoV-2, NAA: NOT DETECTED

## 2019-02-11 ENCOUNTER — Other Ambulatory Visit: Payer: Self-pay

## 2019-02-11 ENCOUNTER — Encounter (INDEPENDENT_AMBULATORY_CARE_PROVIDER_SITE_OTHER): Payer: PPO | Admitting: Ophthalmology

## 2019-02-11 DIAGNOSIS — I10 Essential (primary) hypertension: Secondary | ICD-10-CM | POA: Diagnosis not present

## 2019-02-11 DIAGNOSIS — H35033 Hypertensive retinopathy, bilateral: Secondary | ICD-10-CM

## 2019-02-11 DIAGNOSIS — H353231 Exudative age-related macular degeneration, bilateral, with active choroidal neovascularization: Secondary | ICD-10-CM | POA: Diagnosis not present

## 2019-02-11 DIAGNOSIS — H43813 Vitreous degeneration, bilateral: Secondary | ICD-10-CM | POA: Diagnosis not present

## 2019-02-16 ENCOUNTER — Encounter (INDEPENDENT_AMBULATORY_CARE_PROVIDER_SITE_OTHER): Payer: PPO | Admitting: Ophthalmology

## 2019-02-16 ENCOUNTER — Other Ambulatory Visit: Payer: Self-pay

## 2019-02-16 DIAGNOSIS — H353221 Exudative age-related macular degeneration, left eye, with active choroidal neovascularization: Secondary | ICD-10-CM

## 2019-02-16 DIAGNOSIS — H1132 Conjunctival hemorrhage, left eye: Secondary | ICD-10-CM | POA: Diagnosis not present

## 2019-02-18 DIAGNOSIS — E1165 Type 2 diabetes mellitus with hyperglycemia: Secondary | ICD-10-CM | POA: Diagnosis not present

## 2019-02-18 DIAGNOSIS — E039 Hypothyroidism, unspecified: Secondary | ICD-10-CM | POA: Diagnosis not present

## 2019-02-18 DIAGNOSIS — Z6834 Body mass index (BMI) 34.0-34.9, adult: Secondary | ICD-10-CM | POA: Diagnosis not present

## 2019-02-18 DIAGNOSIS — Z23 Encounter for immunization: Secondary | ICD-10-CM | POA: Diagnosis not present

## 2019-02-18 DIAGNOSIS — E785 Hyperlipidemia, unspecified: Secondary | ICD-10-CM | POA: Diagnosis not present

## 2019-02-18 DIAGNOSIS — I5030 Unspecified diastolic (congestive) heart failure: Secondary | ICD-10-CM | POA: Diagnosis not present

## 2019-03-12 ENCOUNTER — Other Ambulatory Visit: Payer: Self-pay

## 2019-03-12 ENCOUNTER — Ambulatory Visit (INDEPENDENT_AMBULATORY_CARE_PROVIDER_SITE_OTHER): Payer: PPO | Admitting: Cardiology

## 2019-03-12 ENCOUNTER — Encounter: Payer: Self-pay | Admitting: Cardiology

## 2019-03-12 VITALS — BP 146/68 | HR 75 | Ht 67.0 in | Wt 209.1 lb

## 2019-03-12 DIAGNOSIS — I10 Essential (primary) hypertension: Secondary | ICD-10-CM

## 2019-03-12 DIAGNOSIS — I6523 Occlusion and stenosis of bilateral carotid arteries: Secondary | ICD-10-CM

## 2019-03-12 DIAGNOSIS — I5032 Chronic diastolic (congestive) heart failure: Secondary | ICD-10-CM | POA: Diagnosis not present

## 2019-03-12 DIAGNOSIS — E119 Type 2 diabetes mellitus without complications: Secondary | ICD-10-CM

## 2019-03-12 DIAGNOSIS — E1122 Type 2 diabetes mellitus with diabetic chronic kidney disease: Secondary | ICD-10-CM | POA: Diagnosis not present

## 2019-03-12 DIAGNOSIS — N183 Chronic kidney disease, stage 3 unspecified: Secondary | ICD-10-CM

## 2019-03-12 DIAGNOSIS — E785 Hyperlipidemia, unspecified: Secondary | ICD-10-CM | POA: Diagnosis not present

## 2019-03-12 MED ORDER — HYDRALAZINE HCL 50 MG PO TABS: 50.0000 mg | ORAL_TABLET | Freq: Three times a day (TID) | ORAL | 3 refills | Status: DC

## 2019-03-12 NOTE — Patient Instructions (Addendum)
Medication Instructions:  INCREASE, Hydralazine to 50 mg 3 times a day   *If you need a refill on your cardiac medications before your next appointment, please call your pharmacy*  Lab Work: None ordered   If you have labs (blood work) drawn today and your tests are completely normal, you will receive your results only by: Marland Kitchen MyChart Message (if you have MyChart) OR . A paper copy in the mail If you have any lab test that is abnormal or we need to change your treatment, we will call you to review the results.  Testing/Procedures: None ordered   Follow-Up: At [facility] you and your health needs are our priority.  As part of our continuing mission to provide you with exceptional heart care, we have created designated Provider Care Teams.  These Care Teams include your primary Cardiologist (physician) and Advanced Practice Providers (APPs -  Physician Assistants and Nurse Practitioners) who all work together to provide you with the care you need, when you need it.  Your next appointment:   6 month(s)  The format for your next appointment:   In Person  Provider:   You may see Mertie Moores, MD or one of the following Advanced Practice Providers on your designated Care Team:    Richardson Dopp, PA-C  [place], [place]  Daune Perch, NP   Other Instructions  Lifestyle Modifications to Prevent and Treat Heart Disease -Recommend heart healthy/Mediterranean diet, with whole grains, fruits, vegetables, fish, lean meats, nuts, olive oil and avocado oil.  -Limit salt intake to less than 2000 mg per day.  -Recommend moderate walking, starting slowly with a few minutes and working up to 3-5 times/week for 30 minutes each session.  -Recommend avoidance of tobacco products. Avoid excess alcohol. -Keep blood pressure well controlled, ideally less than 130/80.

## 2019-03-12 NOTE — Progress Notes (Signed)
Cardiology Office Note:    Date:  03/12/2019   ID:  Sandra Cuevas, DOB Jul 26, 1928, MRN [id number]  PCP:  Sharilyn Sites, MD  Cardiologist:  Mertie Moores, MD  Referring MD: Sharilyn Sites, MD   Chief Complaint  Patient presents with  . Follow-up    CHF    History of Present Illness:    Sandra Cuevas is a 85 y.o. female with a past medical history significant for chronic diastolic CHF, hypertension, hyperlipidemia, COPD with asthma, diabetes type 2, CKD, stage II, GERD.  She has a history of atypical chest pains with negative stress Myoview in March 2012.  She also has chronic shortness of breath with exertion.  She has carotid bruits and known moderate carotid artery stenosis.  She was last seen in the office by Dr. Acie Fredrickson 06/25/2018.  She is here today for follow-up. She lives alone and only does some light housework. She goes out occasionally with friends but is not very active. She has problems with her left leg that limits her activity. She says that she has been to her orthopedic MD. Her pain is worse after sits a long time.  She denies any shortness of breath unless she is walking long way ordinary.  She has no orthopnea, PND, swelling, palpitations, falls or syncope.  She only uses her Lasix very rarely none recently.  She says that she was taken off of her diabetes medicines about a year ago. She gets meals delivered from [employer].    Cardiac studies   Carotid artery ultrasound 11/17/2018 Summary: Right Carotid: Velocities in the right ICA are consistent with a 40-59%                stenosis, based on peak systolic velocities. The RICA velocities                are elevated and have decreased compared to the prior exam.  Left Carotid: Velocities in the left ICA are consistent with a 40-59% stenosis,               based on peak systolic velocities. The ECA appears >50% stenosed.               The LICA velocities are elevated and have decreased compared to               the  prior exam.  Echo 02/18/2013: EF 60-65% with mild LVH, grade 2 diastolic dysfunction  Past Medical History:  Diagnosis Date  . Arthritis   . Asthma   . Atypical chest pain   . 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 Diabetes mellitus   . GERD (gastroesophageal reflux disease)   . History of cardiovascular stress test 06/01/2010   EF 71% - no evidence of ischemia, normal left ventricular systolic function  . History of hysterectomy 1965  . Hyperlipidemia   . Hypertension   . Hypothyroidism   . Shortness of breath    on exertion  . Tubular adenoma of colon     Past Surgical History:  Procedure Laterality Date  . ABDOMINAL HYSTERECTOMY    . CARDIAC SURGERY     catherization  . HEMORRHOID SURGERY    . TOTAL SHOULDER REPLACEMENT Right     Current Medications: Current Meds  Medication Sig  . acetaminophen (TYLENOL) 500 MG tablet Take 2 tablets (1,000 mg total) by mouth every 6 (six) hours as needed.  Marland Kitchen albuterol (PROVENTIL) (5  MG/ML) 0.5% nebulizer solution Take 0.5 mLs (2.5 mg total) by nebulization every 4 (four) hours as needed for wheezing or shortness of breath.  Marland Kitchen amLODipine (NORVASC) 10 MG tablet Take 1 tablet (10 mg total) by mouth daily.  Marland Kitchen atorvastatin (LIPITOR) 40 MG tablet TAKE 1 TABLET(40 MG) BY MOUTH DAILY AT 6 PM (Patient taking differently: Take 40 mg by mouth daily at 6 PM. )  . fluticasone (FLONASE) 50 MCG/ACT nasal spray SHAKE LIQUID AND USE 2 SPRAYS IN EACH NOSTRIL DAILY (Patient taking differently: Place 2 sprays into both nostrils daily. )  . levothyroxine (SYNTHROID, LEVOTHROID) 50 MCG tablet Take 50 mcg by mouth daily.    . montelukast (SINGULAIR) 10 MG tablet Take 10 mg by mouth daily.  . polyethylene glycol (MIRALAX / GLYCOLAX) packet Take 17 g by mouth daily. (Patient taking differently: Take 17 g by mouth daily as needed for mild constipation. )  . Polyvinyl Alcohol-Povidone (MURINE TEARS FOR DRY EYES) 5-6 MG/ML SOLN Place 1 drop into  both eyes daily as needed (Dry Eyes).  . potassium chloride (MICRO-K) 10 MEQ CR capsule Take 10 mEq by mouth daily.   . traMADol (ULTRAM) 50 MG tablet Take 1 tablet (50 mg total) by mouth every 6 (six) hours as needed.  . zolpidem (AMBIEN) 5 MG tablet Take 2.5 mg by mouth at bedtime.   . [DISCONTINUED] hydrALAZINE (APRESOLINE) 25 MG tablet Take 25 mg by mouth 4 (four) times daily.     Allergies:   Patient has no known allergies.   Social History   Socioeconomic History  . Marital status: Widowed    Spouse name: Not on file  . Number of children: Not on file  . Years of education: Not on file  . Highest education level: Not on file  Occupational History  . Not on file  Social Needs  . Financial resource strain: Not on file  . Food insecurity    Worry: Not on file    Inability: Not on file  . Transportation needs    Medical: Not on file    Non-medical: Not on file  Tobacco Use  . Smoking status: Never Smoker  . Smokeless tobacco: Never Used  Substance and Sexual Activity  . Alcohol use: No  . Drug use: No  . Sexual activity: Never  Lifestyle  . Physical activity    Days per week: Not on file    Minutes per session: Not on file  . Stress: Not on file  Relationships  . Social Herbalist on phone: Not on file    Gets together: Not on file    Attends religious service: Not on file    Active member of club or organization: Not on file    Attends meetings of clubs or organizations: Not on file    Relationship status: Not on file  Other Topics Concern  . Not on file  Social History Narrative  . Not on file     Family History: The patient's family history includes Bone cancer in her sister; Colon cancer in her sister; Diabetes in her brother, brother, and mother; Heart disease in her father; Kidney failure in her mother; Leukemia in her brother and brother; Pancreatic cancer in her brother. ROS:   Please see the history of present illness.     All other  systems reviewed and are negative.   EKG:  EKG is not ordered today.    Recent Labs: 12/22/2018: ALT 30; BUN 13; Creatinine,  Ser 1.11; Hemoglobin 11.3; Platelets 176; Potassium 4.2; Sodium 139   Recent Lipid Panel    Component Value Date/Time   CHOL 143 09/23/2017 0849   TRIG 79 09/23/2017 0849   HDL 50 09/23/2017 0849   CHOLHDL 2.9 09/23/2017 0849   CHOLHDL 3.8 04/18/2015 1022   VLDL 20 04/18/2015 1022   LDLCALC 77 09/23/2017 0849    Physical Exam:    VS:  BP (!) 146/68   Pulse 75   Ht 5\' 7"  (1.702 m)   Wt 209 lb 1.9 oz (94.9 kg)   SpO2 91%   BMI 32.75 kg/m     Wt Readings from Last 6 Encounters:  03/12/19 209 lb 1.9 oz (94.9 kg)  12/22/18 200 lb (90.7 kg)  10/08/18 207 lb 6.4 oz (94.1 kg)  06/25/18 217 lb 12.8 oz (98.8 kg)  06/18/18 216 lb 6 oz (98.1 kg)  04/07/18 217 lb (98.4 kg)     Physical Exam  Constitutional: She is oriented to person, place, and time. She appears well-developed and well-nourished. No distress.  HENT:  Head: Normocephalic and atraumatic.  Neck: Normal range of motion. Neck supple. No JVD present. Carotid bruit is present.  Cardiovascular: Normal rate, regular rhythm, normal heart sounds and intact distal pulses. Exam reveals no gallop and no friction rub.  No murmur heard. Pulmonary/Chest: Effort normal and breath sounds normal. No respiratory distress. She has no wheezes. She has no rales.  Abdominal: Soft. Bowel sounds are normal.  Musculoskeletal: Normal range of motion.        General: No edema.  Neurological: She is alert and oriented to person, place, and time.  Skin: Skin is warm and dry.  Psychiatric: She has a normal mood and affect. Her behavior is normal. Judgment and thought content normal.  Vitals reviewed.    ASSESSMENT:    1. Chronic diastolic heart failure ([place])   2. Essential (primary) hypertension   3. Hyperlipidemia, unspecified hyperlipidemia type   4. Type 2 diabetes mellitus without complication, without  long-term current use of insulin ([place])   5. CKD stage 3 secondary to diabetes ([place])   6. Bilateral carotid artery stenosis    PLAN:    In order of problems listed above:  Chronic diastolic CHF -Patient appears euvolemic.  No significant dyspnea or orthopnea or edema. -Patient does have pain behind the left knee but no leg edema.  Possibly Baker's cyst.  She has been evaluated in the past by her orthopedist.  I recommended that she return for reevaluation.  She may benefit from steroid injection.  Essential hypertension -On amlodipine 10 mg and hydralazine.  Her hydralazine is 25 mg 4 times daily, however she usually only takes it 3 times daily.  Blood pressure is elevated and was elevated on several prior healthcare visits.  I will increase the hydralazine dose to 50 mg 3 times daily.  Hyperlipidemia -On atorvastatin 40 mg -Lipids followed by PCP, 02/18/2019: TC 151, HDL 59, LDL 80, triglycerides 61. -Adequately controlled, continue current therapy.  Diabetes type 2 -Management per primary care.  On no medications.  A1c 6.0.  CKD stage III -Labs in 02/18/2019: Creatinine 0.98, potassium 4.1.  Carotid artery disease -40-59% bilateral per most recent ultrasound in 11/2018 -We will continue to monitor  Medication Adjustments/Labs and Tests Ordered: Current medicines are reviewed at length with the patient today.  Concerns regarding medicines are outlined above. Labs and tests ordered and medication changes are outlined in the patient instructions below:  Patient Instructions  Medication Instructions:  INCREASE, Hydralazine to 50 mg 3 times a day   *If you need a refill on your cardiac medications before your next appointment, please call your pharmacy*  Lab Work: None ordered   If you have labs (blood work) drawn today and your tests are completely normal, you will receive your results only by: Marland Kitchen MyChart Message (if you have MyChart) OR . A paper copy in the mail If you have  any lab test that is abnormal or we need to change your treatment, we will call you to review the results.  Testing/Procedures: None ordered   Follow-Up: At [facility], you and your health needs are our priority.  As part of our continuing mission to provide you with exceptional heart care, we have created designated Provider Care Teams.  These Care Teams include your primary Cardiologist (physician) and Advanced Practice Providers (APPs -  Physician Assistants and Nurse Practitioners) who all work together to provide you with the care you need, when you need it.  Your next appointment:   6 month(s)  The format for your next appointment:   In Person  Provider:   You may see Mertie Moores, MD or one of the following Advanced Practice Providers on your designated Care Team:    Richardson Dopp, PA-C  [place], [place]  Daune Perch, NP   Other Instructions  Lifestyle Modifications to Prevent and Treat Heart Disease -Recommend heart healthy/Mediterranean diet, with whole grains, fruits, vegetables, fish, lean meats, nuts, olive oil and avocado oil.  -Limit salt intake to less than 2000 mg per day.  -Recommend moderate walking, starting slowly with a few minutes and working up to 3-5 times/week for 30 minutes each session.  -Recommend avoidance of tobacco products. Avoid excess alcohol. -Keep blood pressure well controlled, ideally less than 130/80.      Signed, Daune Perch, NP  03/12/2019 6:09 PM     [facility]

## 2019-03-20 ENCOUNTER — Emergency Department (HOSPITAL_COMMUNITY): Payer: PPO

## 2019-03-20 ENCOUNTER — Encounter (HOSPITAL_COMMUNITY): Payer: Self-pay | Admitting: Emergency Medicine

## 2019-03-20 ENCOUNTER — Inpatient Hospital Stay (HOSPITAL_COMMUNITY)
Admission: EM | Admit: 2019-03-20 | Discharge: 2019-03-24 | DRG: 183 | Disposition: A | Discharge status: Home Health | Payer: PPO | Attending: Family Medicine | Admitting: Family Medicine

## 2019-03-20 ENCOUNTER — Other Ambulatory Visit: Payer: Self-pay

## 2019-03-20 DIAGNOSIS — Z79891 Long term (current) use of opiate analgesic: Secondary | ICD-10-CM

## 2019-03-20 DIAGNOSIS — Z23 Encounter for immunization: Secondary | ICD-10-CM | POA: Diagnosis present

## 2019-03-20 DIAGNOSIS — S61212A Laceration without foreign body of right middle finger without damage to nail, initial encounter: Secondary | ICD-10-CM | POA: Diagnosis not present

## 2019-03-20 DIAGNOSIS — Z79899 Other long term (current) drug therapy: Secondary | ICD-10-CM | POA: Diagnosis not present

## 2019-03-20 DIAGNOSIS — K219 Gastro-esophageal reflux disease without esophagitis: Secondary | ICD-10-CM | POA: Diagnosis present

## 2019-03-20 DIAGNOSIS — I5032 Chronic diastolic (congestive) heart failure: Secondary | ICD-10-CM | POA: Diagnosis present

## 2019-03-20 DIAGNOSIS — I130 Hypertensive heart and chronic kidney disease with heart failure and stage 1 through stage 4 chronic kidney disease, or unspecified chronic kidney disease: Secondary | ICD-10-CM | POA: Diagnosis present

## 2019-03-20 DIAGNOSIS — Z8541 Personal history of malignant neoplasm of cervix uteri: Secondary | ICD-10-CM | POA: Diagnosis not present

## 2019-03-20 DIAGNOSIS — S61411A Laceration without foreign body of right hand, initial encounter: Secondary | ICD-10-CM | POA: Diagnosis present

## 2019-03-20 DIAGNOSIS — Z8249 Family history of ischemic heart disease and other diseases of the circulatory system: Secondary | ICD-10-CM

## 2019-03-20 DIAGNOSIS — Z800 Family history of malignant neoplasm of digestive organs: Secondary | ICD-10-CM | POA: Diagnosis not present

## 2019-03-20 DIAGNOSIS — Z90710 Acquired absence of both cervix and uterus: Secondary | ICD-10-CM | POA: Diagnosis not present

## 2019-03-20 DIAGNOSIS — S2241XA Multiple fractures of ribs, right side, initial encounter for closed fracture: Principal | ICD-10-CM | POA: Diagnosis present

## 2019-03-20 DIAGNOSIS — Z96611 Presence of right artificial shoulder joint: Secondary | ICD-10-CM | POA: Diagnosis present

## 2019-03-20 DIAGNOSIS — R0902 Hypoxemia: Secondary | ICD-10-CM

## 2019-03-20 DIAGNOSIS — E1122 Type 2 diabetes mellitus with diabetic chronic kidney disease: Secondary | ICD-10-CM | POA: Diagnosis present

## 2019-03-20 DIAGNOSIS — J449 Chronic obstructive pulmonary disease, unspecified: Secondary | ICD-10-CM | POA: Diagnosis present

## 2019-03-20 DIAGNOSIS — R911 Solitary pulmonary nodule: Secondary | ICD-10-CM | POA: Diagnosis present

## 2019-03-20 DIAGNOSIS — J310 Chronic rhinitis: Secondary | ICD-10-CM | POA: Diagnosis present

## 2019-03-20 DIAGNOSIS — Z79890 Hormone replacement therapy (postmenopausal): Secondary | ICD-10-CM

## 2019-03-20 DIAGNOSIS — S3991XA Unspecified injury of abdomen, initial encounter: Secondary | ICD-10-CM | POA: Diagnosis not present

## 2019-03-20 DIAGNOSIS — Z66 Do not resuscitate: Secondary | ICD-10-CM | POA: Diagnosis present

## 2019-03-20 DIAGNOSIS — Z833 Family history of diabetes mellitus: Secondary | ICD-10-CM

## 2019-03-20 DIAGNOSIS — Z20828 Contact with and (suspected) exposure to other viral communicable diseases: Secondary | ICD-10-CM | POA: Diagnosis present

## 2019-03-20 DIAGNOSIS — S61214A Laceration without foreign body of right ring finger without damage to nail, initial encounter: Secondary | ICD-10-CM | POA: Diagnosis not present

## 2019-03-20 DIAGNOSIS — E785 Hyperlipidemia, unspecified: Secondary | ICD-10-CM | POA: Diagnosis present

## 2019-03-20 DIAGNOSIS — S2231XA Fracture of one rib, right side, initial encounter for closed fracture: Secondary | ICD-10-CM | POA: Diagnosis present

## 2019-03-20 DIAGNOSIS — S3993XA Unspecified injury of pelvis, initial encounter: Secondary | ICD-10-CM | POA: Diagnosis not present

## 2019-03-20 DIAGNOSIS — R079 Chest pain, unspecified: Secondary | ICD-10-CM | POA: Diagnosis not present

## 2019-03-20 DIAGNOSIS — E039 Hypothyroidism, unspecified: Secondary | ICD-10-CM | POA: Diagnosis present

## 2019-03-20 DIAGNOSIS — R102 Pelvic and perineal pain: Secondary | ICD-10-CM | POA: Diagnosis not present

## 2019-03-20 DIAGNOSIS — J9601 Acute respiratory failure with hypoxia: Secondary | ICD-10-CM | POA: Diagnosis not present

## 2019-03-20 DIAGNOSIS — R109 Unspecified abdominal pain: Secondary | ICD-10-CM | POA: Diagnosis not present

## 2019-03-20 DIAGNOSIS — Z841 Family history of disorders of kidney and ureter: Secondary | ICD-10-CM

## 2019-03-20 DIAGNOSIS — S199XXA Unspecified injury of neck, initial encounter: Secondary | ICD-10-CM | POA: Diagnosis not present

## 2019-03-20 DIAGNOSIS — N182 Chronic kidney disease, stage 2 (mild): Secondary | ICD-10-CM | POA: Diagnosis present

## 2019-03-20 DIAGNOSIS — J9600 Acute respiratory failure, unspecified whether with hypoxia or hypercapnia: Secondary | ICD-10-CM | POA: Diagnosis present

## 2019-03-20 DIAGNOSIS — S0990XA Unspecified injury of head, initial encounter: Secondary | ICD-10-CM | POA: Diagnosis not present

## 2019-03-20 HISTORY — DX: Chronic obstructive pulmonary disease, unspecified: J44.9

## 2019-03-20 LAB — CBC
HCT: 37.4 % (ref 36.0–46.0)
Hemoglobin: 11.7 g/dL — ABNORMAL LOW (ref 12.0–15.0)
MCH: 31.1 pg (ref 26.0–34.0)
MCHC: 31.3 g/dL (ref 30.0–36.0)
MCV: 99.5 fL (ref 80.0–100.0)
Platelets: 171 10*3/uL (ref 150–400)
RBC: 3.76 MIL/uL — ABNORMAL LOW (ref 3.87–5.11)
RDW: 14.6 % (ref 11.5–15.5)
WBC: 8.7 10*3/uL (ref 4.0–10.5)
nRBC: 0 % (ref 0.0–0.2)

## 2019-03-20 LAB — PROTIME-INR
INR: 0.9 (ref 0.8–1.2)
Prothrombin Time: 12.2 seconds (ref 11.4–15.2)

## 2019-03-20 LAB — COMPREHENSIVE METABOLIC PANEL
ALT: 31 U/L (ref 0–44)
AST: 32 U/L (ref 15–41)
Albumin: 3.7 g/dL (ref 3.5–5.0)
Alkaline Phosphatase: 90 U/L (ref 38–126)
Anion gap: 11 (ref 5–15)
BUN: 15 mg/dL (ref 8–23)
CO2: 27 mmol/L (ref 22–32)
Calcium: 8.9 mg/dL (ref 8.9–10.3)
Chloride: 99 mmol/L (ref 98–111)
Creatinine, Ser: 1.11 mg/dL — ABNORMAL HIGH (ref 0.44–1.00)
GFR calc Af Amer: 51 mL/min — ABNORMAL LOW (ref >60)
GFR calc non Af Amer: 44 mL/min — ABNORMAL LOW (ref >60)
Glucose, Bld: 106 mg/dL — ABNORMAL HIGH (ref 70–99)
Potassium: 4.7 mmol/L (ref 3.5–5.1)
Sodium: 137 mmol/L (ref 135–145)
Total Bilirubin: 0.9 mg/dL (ref 0.3–1.2)
Total Protein: 6.6 g/dL (ref 6.5–8.1)

## 2019-03-20 LAB — ETHANOL: Alcohol, Ethyl (B): <10 mg/dL (ref <10)

## 2019-03-20 LAB — POC SARS CORONAVIRUS 2 AG -  ED: SARS Coronavirus 2 Ag: NEGATIVE

## 2019-03-20 MED ORDER — ONDANSETRON HCL 4 MG/2ML IJ SOLN: 4.0000 mg | Freq: Four times a day (QID) | INTRAMUSCULAR | Status: DC | PRN

## 2019-03-20 MED ORDER — OXYCODONE-ACETAMINOPHEN 7.5-325 MG PO TABS: 1.0000 | ORAL_TABLET | ORAL | Status: DC | PRN

## 2019-03-20 MED ORDER — ONDANSETRON HCL 4 MG PO TABS
4.0000 mg | ORAL_TABLET | Freq: Four times a day (QID) | ORAL | Status: DC | PRN
  Administered 2019-03-24: 4 mg via ORAL
  Filled 2019-03-20: qty 1

## 2019-03-20 MED ORDER — OXYCODONE-ACETAMINOPHEN 5-325 MG PO TABS
1.0000 | ORAL_TABLET | ORAL | Status: DC | PRN
  Administered 2019-03-21 – 2019-03-22 (×6): 1 via ORAL
  Filled 2019-03-20 (×6): qty 1

## 2019-03-20 MED ORDER — LEVOTHYROXINE SODIUM 50 MCG PO TABS
50.0000 ug | ORAL_TABLET | Freq: Every day | ORAL | Status: DC
  Administered 2019-03-21 – 2019-03-24 (×4): 50 ug via ORAL
  Filled 2019-03-20 (×3): qty 2
  Filled 2019-03-20: qty 1

## 2019-03-20 MED ORDER — FLUTICASONE PROPIONATE 50 MCG/ACT NA SUSP
2.0000 | Freq: Every day | NASAL | Status: DC
  Administered 2019-03-21 – 2019-03-24 (×4): 2 via NASAL
  Filled 2019-03-20: qty 16

## 2019-03-20 MED ORDER — POLYETHYLENE GLYCOL 3350 17 G PO PACK: 17.0000 g | PACK | Freq: Every day | ORAL | Status: DC | PRN

## 2019-03-20 MED ORDER — LIDOCAINE HCL (PF) 1 % IJ SOLN
5.0000 mL | Freq: Once | INTRAMUSCULAR | Status: AC
  Administered 2019-03-20: 5 mL
  Filled 2019-03-20: qty 6

## 2019-03-20 MED ORDER — ORAL CARE MOUTH RINSE
15.0000 mL | Freq: Two times a day (BID) | OROMUCOSAL | Status: DC
  Administered 2019-03-20 – 2019-03-24 (×5): 15 mL via OROMUCOSAL

## 2019-03-20 MED ORDER — TETANUS-DIPHTH-ACELL PERTUSSIS 5-2.5-18.5 LF-MCG/0.5 IM SUSP
0.5000 mL | Freq: Once | INTRAMUSCULAR | Status: AC
  Administered 2019-03-20: 0.5 mL via INTRAMUSCULAR
  Filled 2019-03-20: qty 0.5

## 2019-03-20 MED ORDER — IOHEXOL 300 MG/ML  SOLN
80.0000 mL | Freq: Once | INTRAMUSCULAR | Status: AC | PRN
  Administered 2019-03-20: 80 mL via INTRAVENOUS

## 2019-03-20 MED ORDER — MORPHINE SULFATE (PF) 4 MG/ML IV SOLN
4.0000 mg | Freq: Once | INTRAVENOUS | Status: AC
  Administered 2019-03-20: 4 mg via INTRAVENOUS
  Filled 2019-03-20: qty 1

## 2019-03-20 MED ORDER — CHLORHEXIDINE GLUCONATE CLOTH 2 % EX PADS
6.0000 | MEDICATED_PAD | Freq: Every day | CUTANEOUS | Status: DC
  Administered 2019-03-21 – 2019-03-24 (×3): 6 via TOPICAL

## 2019-03-20 MED ORDER — FENTANYL CITRATE (PF) 100 MCG/2ML IJ SOLN
50.0000 ug | Freq: Once | INTRAMUSCULAR | Status: AC
  Administered 2019-03-20: 50 ug via INTRAVENOUS
  Filled 2019-03-20: qty 2

## 2019-03-20 MED ORDER — AMLODIPINE BESYLATE 5 MG PO TABS
10.0000 mg | ORAL_TABLET | Freq: Every day | ORAL | Status: DC
  Administered 2019-03-21 – 2019-03-24 (×4): 10 mg via ORAL
  Filled 2019-03-20 (×4): qty 2

## 2019-03-20 MED ORDER — LACTATED RINGERS IV SOLN
INTRAVENOUS | Status: DC
  Administered 2019-03-20 – 2019-03-22 (×3): via INTRAVENOUS

## 2019-03-20 MED ORDER — ATORVASTATIN CALCIUM 40 MG PO TABS
40.0000 mg | ORAL_TABLET | Freq: Every day | ORAL | Status: DC
  Administered 2019-03-21 – 2019-03-23 (×3): 40 mg via ORAL
  Filled 2019-03-20 (×4): qty 1

## 2019-03-20 MED ORDER — ALBUTEROL SULFATE (2.5 MG/3ML) 0.083% IN NEBU: 2.5000 mg | INHALATION_SOLUTION | RESPIRATORY_TRACT | Status: DC | PRN

## 2019-03-20 MED ORDER — MONTELUKAST SODIUM 10 MG PO TABS
10.0000 mg | ORAL_TABLET | Freq: Every day | ORAL | Status: DC
  Administered 2019-03-21 – 2019-03-24 (×4): 10 mg via ORAL
  Filled 2019-03-20 (×4): qty 1

## 2019-03-20 MED ORDER — HYDRALAZINE HCL 25 MG PO TABS
50.0000 mg | ORAL_TABLET | Freq: Three times a day (TID) | ORAL | Status: DC
  Administered 2019-03-20 – 2019-03-24 (×11): 50 mg via ORAL
  Filled 2019-03-20 (×12): qty 2

## 2019-03-20 MED ORDER — CYCLOBENZAPRINE HCL 10 MG PO TABS
5.0000 mg | ORAL_TABLET | Freq: Three times a day (TID) | ORAL | Status: DC
  Administered 2019-03-20 – 2019-03-24 (×12): 5 mg via ORAL
  Filled 2019-03-20 (×12): qty 1

## 2019-03-20 MED ORDER — OXYCODONE HCL 5 MG PO TABS
2.5000 mg | ORAL_TABLET | ORAL | Status: DC | PRN
  Administered 2019-03-21 – 2019-03-22 (×5): 2.5 mg via ORAL
  Filled 2019-03-20 (×5): qty 1
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
## 2019-03-20 NOTE — ED Notes (Signed)
Call to resp for ABG

## 2019-03-20 NOTE — ED Notes (Signed)
EDP in to assess 

## 2019-03-20 NOTE — ED Notes (Signed)
Finger wounds cleansed and dressed
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
## 2019-03-20 NOTE — ED Notes (Signed)
To CT

## 2019-03-20 NOTE — ED Notes (Signed)
Report to Nira Conn, RN  ICU

## 2019-03-20 NOTE — ED Notes (Signed)
From CT 
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
## 2019-03-20 NOTE — ED Notes (Signed)
Cancel abg Verbal order DR Darrick Meigs
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
## 2019-03-20 NOTE — ED Notes (Signed)
Lab at bedside

## 2019-03-20 NOTE — ED Notes (Signed)
Dr. Lama at bedside. 

## 2019-03-20 NOTE — ED Notes (Signed)
Respiratory at bedside.

## 2019-03-21 DIAGNOSIS — E1122 Type 2 diabetes mellitus with diabetic chronic kidney disease: Secondary | ICD-10-CM | POA: Diagnosis present

## 2019-03-21 DIAGNOSIS — Z79899 Other long term (current) drug therapy: Secondary | ICD-10-CM | POA: Diagnosis not present

## 2019-03-21 DIAGNOSIS — J310 Chronic rhinitis: Secondary | ICD-10-CM | POA: Diagnosis present

## 2019-03-21 DIAGNOSIS — Z800 Family history of malignant neoplasm of digestive organs: Secondary | ICD-10-CM | POA: Diagnosis not present

## 2019-03-21 DIAGNOSIS — Z841 Family history of disorders of kidney and ureter: Secondary | ICD-10-CM | POA: Diagnosis not present

## 2019-03-21 DIAGNOSIS — Z90710 Acquired absence of both cervix and uterus: Secondary | ICD-10-CM | POA: Diagnosis not present

## 2019-03-21 DIAGNOSIS — Z20828 Contact with and (suspected) exposure to other viral communicable diseases: Secondary | ICD-10-CM | POA: Diagnosis present

## 2019-03-21 DIAGNOSIS — Z8541 Personal history of malignant neoplasm of cervix uteri: Secondary | ICD-10-CM | POA: Diagnosis not present

## 2019-03-21 DIAGNOSIS — I130 Hypertensive heart and chronic kidney disease with heart failure and stage 1 through stage 4 chronic kidney disease, or unspecified chronic kidney disease: Secondary | ICD-10-CM | POA: Diagnosis present

## 2019-03-21 DIAGNOSIS — N182 Chronic kidney disease, stage 2 (mild): Secondary | ICD-10-CM | POA: Diagnosis present

## 2019-03-21 DIAGNOSIS — R911 Solitary pulmonary nodule: Secondary | ICD-10-CM | POA: Diagnosis present

## 2019-03-21 DIAGNOSIS — Z833 Family history of diabetes mellitus: Secondary | ICD-10-CM | POA: Diagnosis not present

## 2019-03-21 DIAGNOSIS — Z8249 Family history of ischemic heart disease and other diseases of the circulatory system: Secondary | ICD-10-CM | POA: Diagnosis not present

## 2019-03-21 DIAGNOSIS — Z23 Encounter for immunization: Secondary | ICD-10-CM | POA: Diagnosis present

## 2019-03-21 DIAGNOSIS — I5032 Chronic diastolic (congestive) heart failure: Secondary | ICD-10-CM | POA: Diagnosis present

## 2019-03-21 DIAGNOSIS — S2231XA Fracture of one rib, right side, initial encounter for closed fracture: Secondary | ICD-10-CM

## 2019-03-21 DIAGNOSIS — Z79890 Hormone replacement therapy (postmenopausal): Secondary | ICD-10-CM | POA: Diagnosis not present

## 2019-03-21 DIAGNOSIS — E039 Hypothyroidism, unspecified: Secondary | ICD-10-CM | POA: Diagnosis present

## 2019-03-21 DIAGNOSIS — J9600 Acute respiratory failure, unspecified whether with hypoxia or hypercapnia: Secondary | ICD-10-CM | POA: Diagnosis present

## 2019-03-21 DIAGNOSIS — Z96611 Presence of right artificial shoulder joint: Secondary | ICD-10-CM | POA: Diagnosis present

## 2019-03-21 DIAGNOSIS — J9601 Acute respiratory failure with hypoxia: Secondary | ICD-10-CM | POA: Diagnosis present

## 2019-03-21 DIAGNOSIS — K219 Gastro-esophageal reflux disease without esophagitis: Secondary | ICD-10-CM | POA: Diagnosis present

## 2019-03-21 DIAGNOSIS — S61411A Laceration without foreign body of right hand, initial encounter: Secondary | ICD-10-CM | POA: Diagnosis present

## 2019-03-21 DIAGNOSIS — Z66 Do not resuscitate: Secondary | ICD-10-CM | POA: Diagnosis present

## 2019-03-21 DIAGNOSIS — E785 Hyperlipidemia, unspecified: Secondary | ICD-10-CM | POA: Diagnosis present

## 2019-03-21 DIAGNOSIS — J449 Chronic obstructive pulmonary disease, unspecified: Secondary | ICD-10-CM | POA: Diagnosis present

## 2019-03-21 DIAGNOSIS — S2241XA Multiple fractures of ribs, right side, initial encounter for closed fracture: Secondary | ICD-10-CM | POA: Diagnosis present

## 2019-03-21 HISTORY — DX: Fracture of one rib, right side, initial encounter for closed fracture: S22.31XA

## 2019-03-21 LAB — COMPREHENSIVE METABOLIC PANEL
ALT: 25 U/L (ref 0–44)
AST: 24 U/L (ref 15–41)
Albumin: 3.1 g/dL — ABNORMAL LOW (ref 3.5–5.0)
Alkaline Phosphatase: 79 U/L (ref 38–126)
Anion gap: 8 (ref 5–15)
BUN: 14 mg/dL (ref 8–23)
CO2: 32 mmol/L (ref 22–32)
Calcium: 8.7 mg/dL — ABNORMAL LOW (ref 8.9–10.3)
Chloride: 99 mmol/L (ref 98–111)
Creatinine, Ser: 0.95 mg/dL (ref 0.44–1.00)
GFR calc Af Amer: >60 mL/min (ref >60)
GFR calc non Af Amer: 53 mL/min — ABNORMAL LOW (ref >60)
Glucose, Bld: 115 mg/dL — ABNORMAL HIGH (ref 70–99)
Potassium: 4.3 mmol/L (ref 3.5–5.1)
Sodium: 139 mmol/L (ref 135–145)
Total Bilirubin: 0.6 mg/dL (ref 0.3–1.2)
Total Protein: 5.7 g/dL — ABNORMAL LOW (ref 6.5–8.1)

## 2019-03-21 LAB — CBC
HCT: 33.1 % — ABNORMAL LOW (ref 36.0–46.0)
Hemoglobin: 10.2 g/dL — ABNORMAL LOW (ref 12.0–15.0)
MCH: 30.9 pg (ref 26.0–34.0)
MCHC: 30.8 g/dL (ref 30.0–36.0)
MCV: 100.3 fL — ABNORMAL HIGH (ref 80.0–100.0)
Platelets: 160 10*3/uL (ref 150–400)
RBC: 3.3 MIL/uL — ABNORMAL LOW (ref 3.87–5.11)
RDW: 14.8 % (ref 11.5–15.5)
WBC: 7.7 10*3/uL (ref 4.0–10.5)
nRBC: 0 % (ref 0.0–0.2)

## 2019-03-21 LAB — URINALYSIS, ROUTINE W REFLEX MICROSCOPIC
Bilirubin Urine: NEGATIVE
Glucose, UA: NEGATIVE mg/dL
Hgb urine dipstick: NEGATIVE
Ketones, ur: NEGATIVE mg/dL
Nitrite: NEGATIVE
Protein, ur: NEGATIVE mg/dL
Specific Gravity, Urine: 1.018 (ref 1.005–1.030)
pH: 6 (ref 5.0–8.0)

## 2019-03-21 LAB — MRSA PCR SCREENING: MRSA by PCR: NEGATIVE

## 2019-03-21 LAB — SARS CORONAVIRUS 2 (TAT 6-24 HRS): SARS Coronavirus 2: NEGATIVE
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
## 2019-03-22 MED ORDER — ACETAMINOPHEN 500 MG PO TABS
1000.0000 mg | ORAL_TABLET | Freq: Three times a day (TID) | ORAL | Status: DC
  Administered 2019-03-23 – 2019-03-24 (×5): 1000 mg via ORAL
  Filled 2019-03-22 (×5): qty 2

## 2019-03-22 MED ORDER — KETOROLAC TROMETHAMINE 15 MG/ML IJ SOLN
15.0000 mg | Freq: Three times a day (TID) | INTRAMUSCULAR | Status: AC
  Administered 2019-03-23 (×2): 15 mg via INTRAVENOUS
  Filled 2019-03-22 (×2): qty 1

## 2019-03-22 MED ORDER — OXYCODONE HCL 5 MG PO TABS
5.0000 mg | ORAL_TABLET | Freq: Four times a day (QID) | ORAL | Status: DC | PRN
  Administered 2019-03-23 – 2019-03-24 (×2): 5 mg via ORAL
  Filled 2019-03-22 (×2): qty 1
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
## 2019-03-22 NOTE — Plan of Care (Signed)
  Problem: Acute Rehab PT Goals(only PT should resolve) Goal: Pt Will Go Supine/Side To Sit Outcome: Progressing Flowsheets (Taken 03/22/2019 1638) Pt will go Supine/Side to Sit:  with min guard assist  with minimal assist Goal: Patient Will Transfer Sit To/From Stand Outcome: Progressing Flowsheets (Taken 03/22/2019 1638) Patient will transfer sit to/from stand:  with min guard assist  with minimal assist Goal: Pt Will Transfer Bed To Chair/Chair To Bed Outcome: Progressing Flowsheets (Taken 03/22/2019 1638) Pt will Transfer Bed to Chair/Chair to Bed:  min guard assist  with min assist Goal: Pt Will Ambulate Outcome: Progressing Flowsheets (Taken 03/22/2019 1638) Pt will Ambulate:  50 feet  with minimal assist  with rolling walker   4:38 PM, 03/22/19 Lonell Grandchild, MPT Physical Therapist with [facility] [phone] office [phone] mobile phone

## 2019-03-23 LAB — BASIC METABOLIC PANEL
Anion gap: 6 (ref 5–15)
BUN: 17 mg/dL (ref 8–23)
CO2: 33 mmol/L — ABNORMAL HIGH (ref 22–32)
Calcium: 8.8 mg/dL — ABNORMAL LOW (ref 8.9–10.3)
Chloride: 98 mmol/L (ref 98–111)
Creatinine, Ser: 1 mg/dL (ref 0.44–1.00)
GFR calc Af Amer: 58 mL/min — ABNORMAL LOW (ref >60)
GFR calc non Af Amer: 50 mL/min — ABNORMAL LOW (ref >60)
Glucose, Bld: 119 mg/dL — ABNORMAL HIGH (ref 70–99)
Potassium: 4.3 mmol/L (ref 3.5–5.1)
Sodium: 137 mmol/L (ref 135–145)

## 2019-03-23 LAB — CBC
HCT: 33.6 % — ABNORMAL LOW (ref 36.0–46.0)
Hemoglobin: 10.3 g/dL — ABNORMAL LOW (ref 12.0–15.0)
MCH: 30.8 pg (ref 26.0–34.0)
MCHC: 30.7 g/dL (ref 30.0–36.0)
MCV: 100.6 fL — ABNORMAL HIGH (ref 80.0–100.0)
Platelets: 153 10*3/uL (ref 150–400)
RBC: 3.34 MIL/uL — ABNORMAL LOW (ref 3.87–5.11)
RDW: 14.6 % (ref 11.5–15.5)
WBC: 7.7 10*3/uL (ref 4.0–10.5)
nRBC: 0 % (ref 0.0–0.2)

## 2019-03-23 LAB — MAGNESIUM: Magnesium: 1.8 mg/dL (ref 1.7–2.4)

## 2019-03-23 NOTE — Progress Notes (Signed)
[place] [phone] Quality rating Patient survey rating 2. [place] [phone] Quality rating Patient survey rating 3. [place] [phone] Quality rating Patient survey rating 4. [place] [phone] Quality rating Patient survey rating 5. [place] [phone] Quality rating Patient survey rating 6. [place] [phone] Quality rating Patient survey rating 7. [place] [phone] Quality rating Patient survey rating 8. [place] [phone] Quality rating Patient survey rating 9. [place] [phone] Quality rating Patient survey rating 11 10. [facility] [phone] Quality rating Patient survey rating 11. [place] [phone] Quality rating Patient survey rating 12. [facility] [place] [phone] Quality rating Patient survey rating 13. [facility] [phone] Quality rating Patient survey rating Not available11 14. [facility] [place] [phone]
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
## 2019-03-24 DIAGNOSIS — S2241XA Multiple fractures of ribs, right side, initial encounter for closed fracture: Principal | ICD-10-CM

## 2019-03-24 MED ORDER — OXYCODONE HCL 5 MG PO TABS: 5.0000 mg | ORAL_TABLET | Freq: Four times a day (QID) | ORAL | 0 refills | Status: DC | PRN

## 2019-03-24 MED ORDER — METHOCARBAMOL 500 MG PO TABS: 500.0000 mg | ORAL_TABLET | Freq: Three times a day (TID) | ORAL | 0 refills | Status: DC

## 2019-03-24 MED ORDER — AMLODIPINE BESYLATE 10 MG PO TABS: 10.0000 mg | ORAL_TABLET | Freq: Every day | ORAL | 0 refills | Status: DC

## 2019-03-24 MED ORDER — POLYETHYLENE GLYCOL 3350 17 G PO PACK: 17.0000 g | PACK | Freq: Every day | ORAL | 0 refills | Status: DC

## 2019-03-24 MED ORDER — ALUM & MAG HYDROXIDE-SIMETH 200-200-20 MG/5ML PO SUSP: 15.0000 mL | Freq: Four times a day (QID) | ORAL | Status: DC | PRN
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
## 2019-03-24 NOTE — Care Management Important Message (Addendum)
Important Message  Patient Details  Name: Sandra Cuevas MRN: [id number] Date of Birth: Jan 31, 1929   Medicare Important Message Given:  Yes(Diana, RN will deliver to patient)  Letter was placed on chart, Beverlee Nims, RN will deliver to patient   Tommy Medal 03/24/2019, 3:16 PM

## 2019-03-24 NOTE — Discharge Instructions (Signed)
   1) please use continuous oxygen as prescribed--- your primary care physician will reevaluate you on the monthly basis and decide if you still need to continue oxygen or not  2) please use incentive spirometry every hour while awake to help your lungs
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
## 2019-03-24 NOTE — TOC Transition Note (Signed)
Transition of Care [facility]) - CM/SW Discharge Note   Patient Details  Name: Sandra Cuevas MRN: [id number] Date of Birth: 10-30-1928  [facility] CM/SW Contact:  Antione Obar, Chauncey [place], RN Phone Number: 03/24/2019, 1:44 PM   Clinical Narrative:   Patient discharging today. Qualifies for home oxygen. O2 to be delivered to room. Home health PT arranged.     Final next level of care: Home w Home Health Services Barriers to Discharge: Barriers Resolved   Patient Goals and CMS Choice Patient states their goals for this hospitalization and ongoing recovery are:: patient and daughter wishes for patient to go home with home health CMS Medicare.gov Compare Post Acute Care list provided to:: Patient Choice offered to / list presented to : Patient  Discharge Placement                       Discharge Plan and Services   Discharge Planning Services: CM Consult Post Acute Care Choice: Home Health          DME Arranged: Oxygen DME Agency: [employer] Date DME Agency Contacted: 03/24/19 Time DME Agency Contacted: [id number] Representative spoke with at DME Agency: [place]: PT [place]: [place] ([place]) Date [place]: 03/23/19 Time Grandfather: [phone] Representative spoke with at [place]: [place] Determinants of Health ([place]) Interventions     Readmission Risk Interventions No flowsheet data found.

## 2019-03-24 NOTE — Progress Notes (Signed)
SATURATION QUALIFICATIONS: (This note is used to comply with regulatory documentation for home oxygen)  Patient Saturations on Room Air at Rest = 93%  Patient Saturations on Room Air while Ambulating = 94% dropped to 86   Patient Saturations on 2 Liters of oxygen while Ambulating = 89 increased to 94% at rest    Please briefly explain why patient needs home oxygen:

## 2019-03-27 DIAGNOSIS — J9600 Acute respiratory failure, unspecified whether with hypoxia or hypercapnia: Secondary | ICD-10-CM | POA: Diagnosis not present

## 2019-03-27 DIAGNOSIS — E119 Type 2 diabetes mellitus without complications: Secondary | ICD-10-CM | POA: Diagnosis not present

## 2019-03-27 DIAGNOSIS — Z9181 History of falling: Secondary | ICD-10-CM | POA: Diagnosis not present

## 2019-03-27 DIAGNOSIS — M1990 Unspecified osteoarthritis, unspecified site: Secondary | ICD-10-CM | POA: Diagnosis not present

## 2019-03-27 DIAGNOSIS — S2241XD Multiple fractures of ribs, right side, subsequent encounter for fracture with routine healing: Secondary | ICD-10-CM | POA: Diagnosis not present

## 2019-03-27 DIAGNOSIS — K219 Gastro-esophageal reflux disease without esophagitis: Secondary | ICD-10-CM | POA: Diagnosis not present

## 2019-03-27 DIAGNOSIS — E785 Hyperlipidemia, unspecified: Secondary | ICD-10-CM | POA: Diagnosis not present

## 2019-03-27 DIAGNOSIS — J449 Chronic obstructive pulmonary disease, unspecified: Secondary | ICD-10-CM | POA: Diagnosis not present

## 2019-03-27 DIAGNOSIS — I10 Essential (primary) hypertension: Secondary | ICD-10-CM | POA: Diagnosis not present

## 2019-03-27 DIAGNOSIS — D126 Benign neoplasm of colon, unspecified: Secondary | ICD-10-CM | POA: Diagnosis not present

## 2019-03-27 DIAGNOSIS — Z79891 Long term (current) use of opiate analgesic: Secondary | ICD-10-CM | POA: Diagnosis not present

## 2019-03-27 DIAGNOSIS — E039 Hypothyroidism, unspecified: Secondary | ICD-10-CM | POA: Diagnosis not present

## 2019-04-05 DIAGNOSIS — Z4802 Encounter for removal of sutures: Secondary | ICD-10-CM | POA: Diagnosis not present

## 2019-04-05 DIAGNOSIS — E6609 Other obesity due to excess calories: Secondary | ICD-10-CM | POA: Diagnosis not present

## 2019-04-05 DIAGNOSIS — Z6834 Body mass index (BMI) 34.0-34.9, adult: Secondary | ICD-10-CM | POA: Diagnosis not present

## 2019-04-09 DIAGNOSIS — E039 Hypothyroidism, unspecified: Secondary | ICD-10-CM | POA: Diagnosis not present

## 2019-04-09 DIAGNOSIS — M1990 Unspecified osteoarthritis, unspecified site: Secondary | ICD-10-CM | POA: Diagnosis not present

## 2019-04-09 DIAGNOSIS — J449 Chronic obstructive pulmonary disease, unspecified: Secondary | ICD-10-CM | POA: Diagnosis not present

## 2019-04-09 DIAGNOSIS — S2241XD Multiple fractures of ribs, right side, subsequent encounter for fracture with routine healing: Secondary | ICD-10-CM | POA: Diagnosis not present

## 2019-04-09 DIAGNOSIS — K219 Gastro-esophageal reflux disease without esophagitis: Secondary | ICD-10-CM | POA: Diagnosis not present

## 2019-04-09 DIAGNOSIS — E119 Type 2 diabetes mellitus without complications: Secondary | ICD-10-CM | POA: Diagnosis not present

## 2019-04-09 DIAGNOSIS — Z79891 Long term (current) use of opiate analgesic: Secondary | ICD-10-CM | POA: Diagnosis not present

## 2019-04-09 DIAGNOSIS — Z9181 History of falling: Secondary | ICD-10-CM | POA: Diagnosis not present

## 2019-04-09 DIAGNOSIS — I10 Essential (primary) hypertension: Secondary | ICD-10-CM | POA: Diagnosis not present

## 2019-04-09 DIAGNOSIS — E785 Hyperlipidemia, unspecified: Secondary | ICD-10-CM | POA: Diagnosis not present

## 2019-04-09 DIAGNOSIS — J9600 Acute respiratory failure, unspecified whether with hypoxia or hypercapnia: Secondary | ICD-10-CM | POA: Diagnosis not present

## 2019-04-09 DIAGNOSIS — D126 Benign neoplasm of colon, unspecified: Secondary | ICD-10-CM | POA: Diagnosis not present

## 2019-04-14 DIAGNOSIS — J449 Chronic obstructive pulmonary disease, unspecified: Secondary | ICD-10-CM | POA: Diagnosis not present

## 2019-04-21 DIAGNOSIS — J9600 Acute respiratory failure, unspecified whether with hypoxia or hypercapnia: Secondary | ICD-10-CM | POA: Diagnosis not present

## 2019-04-21 DIAGNOSIS — E119 Type 2 diabetes mellitus without complications: Secondary | ICD-10-CM | POA: Diagnosis not present

## 2019-04-21 DIAGNOSIS — J449 Chronic obstructive pulmonary disease, unspecified: Secondary | ICD-10-CM | POA: Diagnosis not present

## 2019-04-21 DIAGNOSIS — M1990 Unspecified osteoarthritis, unspecified site: Secondary | ICD-10-CM | POA: Diagnosis not present

## 2019-04-24 DIAGNOSIS — J449 Chronic obstructive pulmonary disease, unspecified: Secondary | ICD-10-CM | POA: Diagnosis not present

## 2019-05-15 DIAGNOSIS — J449 Chronic obstructive pulmonary disease, unspecified: Secondary | ICD-10-CM | POA: Diagnosis not present

## 2019-05-19 DIAGNOSIS — J449 Chronic obstructive pulmonary disease, unspecified: Secondary | ICD-10-CM | POA: Diagnosis not present

## 2019-05-19 DIAGNOSIS — E119 Type 2 diabetes mellitus without complications: Secondary | ICD-10-CM | POA: Diagnosis not present

## 2019-05-19 DIAGNOSIS — I10 Essential (primary) hypertension: Secondary | ICD-10-CM | POA: Diagnosis not present

## 2019-05-19 DIAGNOSIS — J9600 Acute respiratory failure, unspecified whether with hypoxia or hypercapnia: Secondary | ICD-10-CM | POA: Diagnosis not present

## 2019-05-25 DIAGNOSIS — J449 Chronic obstructive pulmonary disease, unspecified: Secondary | ICD-10-CM | POA: Diagnosis not present

## 2019-06-02 DIAGNOSIS — M1990 Unspecified osteoarthritis, unspecified site: Secondary | ICD-10-CM | POA: Diagnosis not present

## 2019-06-02 DIAGNOSIS — J9600 Acute respiratory failure, unspecified whether with hypoxia or hypercapnia: Secondary | ICD-10-CM | POA: Diagnosis not present

## 2019-06-02 DIAGNOSIS — E119 Type 2 diabetes mellitus without complications: Secondary | ICD-10-CM | POA: Diagnosis not present

## 2019-06-02 DIAGNOSIS — J449 Chronic obstructive pulmonary disease, unspecified: Secondary | ICD-10-CM | POA: Diagnosis not present

## 2019-06-12 DIAGNOSIS — J449 Chronic obstructive pulmonary disease, unspecified: Secondary | ICD-10-CM | POA: Diagnosis not present

## 2019-06-22 DIAGNOSIS — J449 Chronic obstructive pulmonary disease, unspecified: Secondary | ICD-10-CM | POA: Diagnosis not present

## 2019-06-25 DIAGNOSIS — H3589 Other specified retinal disorders: Secondary | ICD-10-CM | POA: Diagnosis not present

## 2019-06-25 DIAGNOSIS — H35033 Hypertensive retinopathy, bilateral: Secondary | ICD-10-CM | POA: Diagnosis not present

## 2019-06-25 DIAGNOSIS — H11153 Pinguecula, bilateral: Secondary | ICD-10-CM | POA: Diagnosis not present

## 2019-06-25 DIAGNOSIS — E119 Type 2 diabetes mellitus without complications: Secondary | ICD-10-CM | POA: Diagnosis not present

## 2019-06-25 DIAGNOSIS — H353114 Nonexudative age-related macular degeneration, right eye, advanced atrophic with subfoveal involvement: Secondary | ICD-10-CM | POA: Diagnosis not present

## 2019-06-25 DIAGNOSIS — I10 Essential (primary) hypertension: Secondary | ICD-10-CM | POA: Diagnosis not present

## 2019-06-25 DIAGNOSIS — H35031 Hypertensive retinopathy, right eye: Secondary | ICD-10-CM | POA: Diagnosis not present

## 2019-06-25 DIAGNOSIS — H524 Presbyopia: Secondary | ICD-10-CM | POA: Diagnosis not present

## 2019-06-25 DIAGNOSIS — Z7984 Long term (current) use of oral hypoglycemic drugs: Secondary | ICD-10-CM | POA: Diagnosis not present

## 2019-06-25 DIAGNOSIS — H16143 Punctate keratitis, bilateral: Secondary | ICD-10-CM | POA: Diagnosis not present

## 2019-06-25 DIAGNOSIS — H353221 Exudative age-related macular degeneration, left eye, with active choroidal neovascularization: Secondary | ICD-10-CM | POA: Diagnosis not present

## 2019-06-25 DIAGNOSIS — H35363 Drusen (degenerative) of macula, bilateral: Secondary | ICD-10-CM | POA: Diagnosis not present

## 2019-07-01 ENCOUNTER — Other Ambulatory Visit: Payer: Self-pay

## 2019-07-01 ENCOUNTER — Encounter (INDEPENDENT_AMBULATORY_CARE_PROVIDER_SITE_OTHER): Payer: PPO | Admitting: Ophthalmology

## 2019-07-01 DIAGNOSIS — H353231 Exudative age-related macular degeneration, bilateral, with active choroidal neovascularization: Secondary | ICD-10-CM

## 2019-07-01 DIAGNOSIS — H35033 Hypertensive retinopathy, bilateral: Secondary | ICD-10-CM | POA: Diagnosis not present

## 2019-07-01 DIAGNOSIS — I10 Essential (primary) hypertension: Secondary | ICD-10-CM

## 2019-07-01 DIAGNOSIS — E119 Type 2 diabetes mellitus without complications: Secondary | ICD-10-CM | POA: Diagnosis not present

## 2019-07-01 DIAGNOSIS — H43813 Vitreous degeneration, bilateral: Secondary | ICD-10-CM

## 2019-07-06 DIAGNOSIS — M542 Cervicalgia: Secondary | ICD-10-CM | POA: Diagnosis not present

## 2019-07-06 DIAGNOSIS — M25512 Pain in left shoulder: Secondary | ICD-10-CM | POA: Diagnosis not present

## 2019-07-13 DIAGNOSIS — J449 Chronic obstructive pulmonary disease, unspecified: Secondary | ICD-10-CM | POA: Diagnosis not present

## 2019-07-23 DIAGNOSIS — J449 Chronic obstructive pulmonary disease, unspecified: Secondary | ICD-10-CM | POA: Diagnosis not present

## 2019-07-26 ENCOUNTER — Other Ambulatory Visit (HOSPITAL_COMMUNITY): Payer: Self-pay | Admitting: Family Medicine

## 2019-07-26 DIAGNOSIS — Z1231 Encounter for screening mammogram for malignant neoplasm of breast: Secondary | ICD-10-CM

## 2019-07-27 ENCOUNTER — Other Ambulatory Visit: Payer: Self-pay

## 2019-07-27 MED ORDER — ATORVASTATIN CALCIUM 40 MG PO TABS: ORAL_TABLET | ORAL | 2 refills | Status: DC

## 2019-08-04 DIAGNOSIS — I701 Atherosclerosis of renal artery: Secondary | ICD-10-CM | POA: Diagnosis not present

## 2019-08-04 DIAGNOSIS — Z0000 Encounter for general adult medical examination without abnormal findings: Secondary | ICD-10-CM | POA: Diagnosis not present

## 2019-08-04 DIAGNOSIS — E7849 Other hyperlipidemia: Secondary | ICD-10-CM | POA: Diagnosis not present

## 2019-08-04 DIAGNOSIS — E785 Hyperlipidemia, unspecified: Secondary | ICD-10-CM | POA: Diagnosis not present

## 2019-08-04 DIAGNOSIS — R7309 Other abnormal glucose: Secondary | ICD-10-CM | POA: Diagnosis not present

## 2019-08-04 DIAGNOSIS — M5136 Other intervertebral disc degeneration, lumbar region: Secondary | ICD-10-CM | POA: Diagnosis not present

## 2019-08-04 DIAGNOSIS — D649 Anemia, unspecified: Secondary | ICD-10-CM | POA: Diagnosis not present

## 2019-08-04 DIAGNOSIS — Z6833 Body mass index (BMI) 33.0-33.9, adult: Secondary | ICD-10-CM | POA: Diagnosis not present

## 2019-08-04 DIAGNOSIS — E039 Hypothyroidism, unspecified: Secondary | ICD-10-CM | POA: Diagnosis not present

## 2019-08-04 DIAGNOSIS — Z1389 Encounter for screening for other disorder: Secondary | ICD-10-CM | POA: Diagnosis not present

## 2019-08-04 DIAGNOSIS — J45909 Unspecified asthma, uncomplicated: Secondary | ICD-10-CM | POA: Diagnosis not present

## 2019-08-04 DIAGNOSIS — R0902 Hypoxemia: Secondary | ICD-10-CM | POA: Diagnosis not present

## 2019-08-04 DIAGNOSIS — E6609 Other obesity due to excess calories: Secondary | ICD-10-CM | POA: Diagnosis not present

## 2019-08-05 DIAGNOSIS — E538 Deficiency of other specified B group vitamins: Secondary | ICD-10-CM | POA: Diagnosis not present

## 2019-08-07 DIAGNOSIS — Z1211 Encounter for screening for malignant neoplasm of colon: Secondary | ICD-10-CM | POA: Diagnosis not present

## 2019-08-08 ENCOUNTER — Encounter (HOSPITAL_COMMUNITY): Payer: Self-pay | Admitting: Emergency Medicine

## 2019-08-08 ENCOUNTER — Observation Stay (HOSPITAL_COMMUNITY)
Admission: EM | Admit: 2019-08-08 | Discharge: 2019-08-09 | Disposition: A | Discharge status: Home / Self Care | Payer: PPO | Source: Home / Self Care | Attending: Emergency Medicine | Admitting: Emergency Medicine

## 2019-08-08 ENCOUNTER — Emergency Department (HOSPITAL_COMMUNITY): Payer: PPO

## 2019-08-08 ENCOUNTER — Other Ambulatory Visit: Payer: Self-pay

## 2019-08-08 DIAGNOSIS — N179 Acute kidney failure, unspecified: Secondary | ICD-10-CM | POA: Diagnosis not present

## 2019-08-08 DIAGNOSIS — J45909 Unspecified asthma, uncomplicated: Secondary | ICD-10-CM | POA: Insufficient documentation

## 2019-08-08 DIAGNOSIS — Z833 Family history of diabetes mellitus: Secondary | ICD-10-CM | POA: Diagnosis not present

## 2019-08-08 DIAGNOSIS — J9621 Acute and chronic respiratory failure with hypoxia: Secondary | ICD-10-CM | POA: Diagnosis not present

## 2019-08-08 DIAGNOSIS — Z20822 Contact with and (suspected) exposure to covid-19: Secondary | ICD-10-CM | POA: Insufficient documentation

## 2019-08-08 DIAGNOSIS — Z96611 Presence of right artificial shoulder joint: Secondary | ICD-10-CM | POA: Diagnosis not present

## 2019-08-08 DIAGNOSIS — I110 Hypertensive heart disease with heart failure: Secondary | ICD-10-CM | POA: Diagnosis not present

## 2019-08-08 DIAGNOSIS — J449 Chronic obstructive pulmonary disease, unspecified: Secondary | ICD-10-CM | POA: Insufficient documentation

## 2019-08-08 DIAGNOSIS — I5033 Acute on chronic diastolic (congestive) heart failure: Secondary | ICD-10-CM

## 2019-08-08 DIAGNOSIS — J441 Chronic obstructive pulmonary disease with (acute) exacerbation: Secondary | ICD-10-CM | POA: Diagnosis not present

## 2019-08-08 DIAGNOSIS — I10 Essential (primary) hypertension: Secondary | ICD-10-CM | POA: Insufficient documentation

## 2019-08-08 DIAGNOSIS — R0602 Shortness of breath: Secondary | ICD-10-CM | POA: Diagnosis not present

## 2019-08-08 DIAGNOSIS — Z79899 Other long term (current) drug therapy: Secondary | ICD-10-CM | POA: Insufficient documentation

## 2019-08-08 DIAGNOSIS — G894 Chronic pain syndrome: Secondary | ICD-10-CM | POA: Diagnosis not present

## 2019-08-08 DIAGNOSIS — I959 Hypotension, unspecified: Secondary | ICD-10-CM | POA: Diagnosis not present

## 2019-08-08 DIAGNOSIS — Z8249 Family history of ischemic heart disease and other diseases of the circulatory system: Secondary | ICD-10-CM | POA: Diagnosis not present

## 2019-08-08 DIAGNOSIS — E785 Hyperlipidemia, unspecified: Secondary | ICD-10-CM | POA: Diagnosis not present

## 2019-08-08 DIAGNOSIS — J9611 Chronic respiratory failure with hypoxia: Secondary | ICD-10-CM | POA: Diagnosis not present

## 2019-08-08 DIAGNOSIS — R0902 Hypoxemia: Secondary | ICD-10-CM | POA: Insufficient documentation

## 2019-08-08 DIAGNOSIS — Z9981 Dependence on supplemental oxygen: Secondary | ICD-10-CM | POA: Diagnosis not present

## 2019-08-08 DIAGNOSIS — N1832 Chronic kidney disease, stage 3b: Secondary | ICD-10-CM | POA: Diagnosis not present

## 2019-08-08 DIAGNOSIS — J9601 Acute respiratory failure with hypoxia: Secondary | ICD-10-CM | POA: Diagnosis not present

## 2019-08-08 DIAGNOSIS — R062 Wheezing: Secondary | ICD-10-CM | POA: Diagnosis not present

## 2019-08-08 DIAGNOSIS — M1990 Unspecified osteoarthritis, unspecified site: Secondary | ICD-10-CM | POA: Diagnosis not present

## 2019-08-08 DIAGNOSIS — Z800 Family history of malignant neoplasm of digestive organs: Secondary | ICD-10-CM | POA: Diagnosis not present

## 2019-08-08 DIAGNOSIS — Z66 Do not resuscitate: Secondary | ICD-10-CM | POA: Diagnosis not present

## 2019-08-08 DIAGNOSIS — I5032 Chronic diastolic (congestive) heart failure: Secondary | ICD-10-CM | POA: Diagnosis not present

## 2019-08-08 DIAGNOSIS — I130 Hypertensive heart and chronic kidney disease with heart failure and stage 1 through stage 4 chronic kidney disease, or unspecified chronic kidney disease: Secondary | ICD-10-CM | POA: Diagnosis not present

## 2019-08-08 DIAGNOSIS — I4510 Unspecified right bundle-branch block: Secondary | ICD-10-CM | POA: Diagnosis not present

## 2019-08-08 DIAGNOSIS — K219 Gastro-esophageal reflux disease without esophagitis: Secondary | ICD-10-CM | POA: Diagnosis not present

## 2019-08-08 DIAGNOSIS — I5031 Acute diastolic (congestive) heart failure: Secondary | ICD-10-CM | POA: Diagnosis not present

## 2019-08-08 DIAGNOSIS — R0600 Dyspnea, unspecified: Secondary | ICD-10-CM | POA: Diagnosis not present

## 2019-08-08 DIAGNOSIS — E039 Hypothyroidism, unspecified: Secondary | ICD-10-CM | POA: Insufficient documentation

## 2019-08-08 DIAGNOSIS — R069 Unspecified abnormalities of breathing: Secondary | ICD-10-CM | POA: Diagnosis not present

## 2019-08-08 DIAGNOSIS — J90 Pleural effusion, not elsewhere classified: Secondary | ICD-10-CM | POA: Diagnosis not present

## 2019-08-08 DIAGNOSIS — E1122 Type 2 diabetes mellitus with diabetic chronic kidney disease: Secondary | ICD-10-CM | POA: Diagnosis not present

## 2019-08-08 DIAGNOSIS — J9600 Acute respiratory failure, unspecified whether with hypoxia or hypercapnia: Secondary | ICD-10-CM | POA: Diagnosis present

## 2019-08-08 LAB — CBC WITH DIFFERENTIAL/PLATELET
Abs Immature Granulocytes: 0.12 10*3/uL — ABNORMAL HIGH (ref 0.00–0.07)
Basophils Absolute: 0 10*3/uL (ref 0.0–0.1)
Basophils Relative: 0 %
Eosinophils Absolute: 0.1 10*3/uL (ref 0.0–0.5)
Eosinophils Relative: 1 %
HCT: 30.7 % — ABNORMAL LOW (ref 36.0–46.0)
Hemoglobin: 9.2 g/dL — ABNORMAL LOW (ref 12.0–15.0)
Immature Granulocytes: 2 %
Lymphocytes Relative: 14 %
Lymphs Abs: 1.1 10*3/uL (ref 0.7–4.0)
MCH: 31 pg (ref 26.0–34.0)
MCHC: 30 g/dL (ref 30.0–36.0)
MCV: 103.4 fL — ABNORMAL HIGH (ref 80.0–100.0)
Monocytes Absolute: 0.9 10*3/uL (ref 0.1–1.0)
Monocytes Relative: 12 %
Neutro Abs: 5.7 10*3/uL (ref 1.7–7.7)
Neutrophils Relative %: 71 %
Platelets: 172 10*3/uL (ref 150–400)
RBC: 2.97 MIL/uL — ABNORMAL LOW (ref 3.87–5.11)
RDW: 14.3 % (ref 11.5–15.5)
WBC: 7.9 10*3/uL (ref 4.0–10.5)
nRBC: 0 % (ref 0.0–0.2)

## 2019-08-08 LAB — BASIC METABOLIC PANEL
Anion gap: 5 (ref 5–15)
BUN: 18 mg/dL (ref 8–23)
CO2: 33 mmol/L — ABNORMAL HIGH (ref 22–32)
Calcium: 8.9 mg/dL (ref 8.9–10.3)
Chloride: 99 mmol/L (ref 98–111)
Creatinine, Ser: 0.85 mg/dL (ref 0.44–1.00)
GFR calc Af Amer: >60 mL/min (ref >60)
GFR calc non Af Amer: >60 mL/min (ref >60)
Glucose, Bld: 124 mg/dL — ABNORMAL HIGH (ref 70–99)
Potassium: 4.4 mmol/L (ref 3.5–5.1)
Sodium: 137 mmol/L (ref 135–145)

## 2019-08-08 LAB — BRAIN NATRIURETIC PEPTIDE: B Natriuretic Peptide: 103 pg/mL — ABNORMAL HIGH (ref 0.0–100.0)

## 2019-08-08 LAB — TROPONIN I (HIGH SENSITIVITY)
Troponin I (High Sensitivity): 18 ng/L — ABNORMAL HIGH (ref <18)
Troponin I (High Sensitivity): 19 ng/L — ABNORMAL HIGH (ref <18)

## 2019-08-08 LAB — POC SARS CORONAVIRUS 2 AG -  ED: SARS Coronavirus 2 Ag: NEGATIVE

## 2019-08-08 LAB — RESPIRATORY PANEL BY RT PCR (FLU A&B, COVID)
Influenza A by PCR: NEGATIVE
Influenza B by PCR: NEGATIVE
SARS Coronavirus 2 by RT PCR: NEGATIVE

## 2019-08-08 MED ORDER — LEVOTHYROXINE SODIUM 50 MCG PO TABS
50.0000 ug | ORAL_TABLET | Freq: Every day | ORAL | Status: DC
  Administered 2019-08-09: 06:00:00 50 ug via ORAL
  Filled 2019-08-08: qty 1

## 2019-08-08 MED ORDER — IPRATROPIUM BROMIDE 0.02 % IN SOLN
0.5000 mg | Freq: Four times a day (QID) | RESPIRATORY_TRACT | Status: DC
  Administered 2019-08-08: 0.5 mg via RESPIRATORY_TRACT
  Filled 2019-08-08: qty 2.5

## 2019-08-08 MED ORDER — FAMOTIDINE 20 MG PO TABS
20.0000 mg | ORAL_TABLET | Freq: Every day | ORAL | Status: DC
  Administered 2019-08-08 – 2019-08-09 (×2): 20 mg via ORAL
  Filled 2019-08-08 (×2): qty 1

## 2019-08-08 MED ORDER — SODIUM CHLORIDE 0.9 % IV SOLN: 250.0000 mL | INTRAVENOUS | Status: DC | PRN

## 2019-08-08 MED ORDER — ACETAMINOPHEN 650 MG RE SUPP: 650.0000 mg | Freq: Four times a day (QID) | RECTAL | Status: DC | PRN

## 2019-08-08 MED ORDER — ZOLPIDEM TARTRATE 5 MG PO TABS
5.0000 mg | ORAL_TABLET | Freq: Every evening | ORAL | Status: DC | PRN
  Administered 2019-08-08: 22:00:00 5 mg via ORAL
  Filled 2019-08-08: qty 1

## 2019-08-08 MED ORDER — SODIUM CHLORIDE 0.9% FLUSH
3.0000 mL | Freq: Two times a day (BID) | INTRAVENOUS | Status: DC
  Administered 2019-08-08 – 2019-08-09 (×2): 3 mL via INTRAVENOUS

## 2019-08-08 MED ORDER — ENOXAPARIN SODIUM 40 MG/0.4ML ~~LOC~~ SOLN
40.0000 mg | SUBCUTANEOUS | Status: DC
  Administered 2019-08-08: 22:00:00 40 mg via SUBCUTANEOUS
  Filled 2019-08-08: qty 0.4

## 2019-08-08 MED ORDER — MONTELUKAST SODIUM 10 MG PO TABS
10.0000 mg | ORAL_TABLET | Freq: Every day | ORAL | Status: DC
  Administered 2019-08-09: 10 mg via ORAL
  Filled 2019-08-08: qty 1

## 2019-08-08 MED ORDER — GABAPENTIN 300 MG PO CAPS
300.0000 mg | ORAL_CAPSULE | Freq: Three times a day (TID) | ORAL | Status: DC
  Administered 2019-08-08 – 2019-08-09 (×2): 300 mg via ORAL
  Filled 2019-08-08 (×2): qty 1

## 2019-08-08 MED ORDER — AMLODIPINE BESYLATE 5 MG PO TABS
10.0000 mg | ORAL_TABLET | Freq: Every day | ORAL | Status: DC
  Administered 2019-08-08 – 2019-08-09 (×2): 10 mg via ORAL
  Filled 2019-08-08 (×2): qty 2

## 2019-08-08 MED ORDER — POTASSIUM CHLORIDE CRYS ER 10 MEQ PO TBCR
10.0000 meq | EXTENDED_RELEASE_TABLET | Freq: Every day | ORAL | Status: DC
  Administered 2019-08-09: 10 meq via ORAL
  Filled 2019-08-08: qty 1

## 2019-08-08 MED ORDER — HYDRALAZINE HCL 25 MG PO TABS
50.0000 mg | ORAL_TABLET | Freq: Three times a day (TID) | ORAL | Status: DC
  Administered 2019-08-08 – 2019-08-09 (×2): 50 mg via ORAL
  Filled 2019-08-08 (×2): qty 2

## 2019-08-08 MED ORDER — ACETAMINOPHEN 325 MG PO TABS: 650.0000 mg | ORAL_TABLET | Freq: Four times a day (QID) | ORAL | Status: DC | PRN

## 2019-08-08 MED ORDER — GUAIFENESIN ER 600 MG PO TB12
600.0000 mg | ORAL_TABLET | Freq: Two times a day (BID) | ORAL | Status: DC
  Administered 2019-08-08 – 2019-08-09 (×2): 600 mg via ORAL
  Filled 2019-08-08 (×2): qty 1

## 2019-08-08 MED ORDER — ATORVASTATIN CALCIUM 40 MG PO TABS
40.0000 mg | ORAL_TABLET | Freq: Every day | ORAL | Status: DC
  Administered 2019-08-08: 22:00:00 40 mg via ORAL
  Filled 2019-08-08: qty 1

## 2019-08-08 MED ORDER — FUROSEMIDE 10 MG/ML IJ SOLN
40.0000 mg | Freq: Once | INTRAMUSCULAR | Status: AC
  Administered 2019-08-08: 21:00:00 40 mg via INTRAVENOUS
  Filled 2019-08-08: qty 4

## 2019-08-08 MED ORDER — TRAMADOL HCL 50 MG PO TABS
50.0000 mg | ORAL_TABLET | Freq: Four times a day (QID) | ORAL | Status: DC | PRN
  Administered 2019-08-08: 50 mg via ORAL
  Filled 2019-08-08: qty 1

## 2019-08-08 MED ORDER — ALBUTEROL SULFATE (2.5 MG/3ML) 0.083% IN NEBU
2.5000 mg | INHALATION_SOLUTION | Freq: Four times a day (QID) | RESPIRATORY_TRACT | Status: DC
  Administered 2019-08-08: 23:00:00 2.5 mg via RESPIRATORY_TRACT
  Filled 2019-08-08: qty 3

## 2019-08-08 MED ORDER — SODIUM CHLORIDE 0.9% FLUSH: 3.0000 mL | INTRAVENOUS | Status: DC | PRN

## 2019-08-08 NOTE — ED Triage Notes (Signed)
Increased shortness of breath, o2 sats 84% upon ems arrival, pt wears 2l o2 all the time.  Given 2.5mg  albuterol en route by ems. sats are up to 96% on 2l

## 2019-08-08 NOTE — ED Provider Notes (Signed)
[facility] EMERGENCY DEPARTMENT Provider Note  CSN: [id number] Arrival date & time: 08/08/19 1514    History Chief Complaint  Patient presents with  . Shortness of Breath    HPI  Sandra Cuevas is a 86 y.o. female with history of COPD, on home O2, reports this morning she was feeling SOB and oxygen had dropped into the 70s on her regular 2L of home oxygen. She felt better at rest. She was not having any wheezing, so she did not use her nebulizer. She did not have any chest pain, fever, or cough. No leg swelling. Patient called her daughter who eventually called EMS this afternoon. EMS reports she was hypoxic on her home oxygen and was given nebs with good improvement. She is symptom free at the time of my evaluation.    Past Medical History:  Diagnosis Date  . Arthritis   . Asthma   . Atypical chest pain   . 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 COPD (chronic obstructive pulmonary disease) ([place])   . Diabetes mellitus   . GERD (gastroesophageal reflux disease)   . History of cardiovascular stress test 06/01/2010   EF 71% - no evidence of ischemia, normal left ventricular systolic function  . History of hysterectomy 1965  . Hyperlipidemia   . Hypertension   . Hypothyroidism   . Shortness of breath    on exertion  . Tubular adenoma of colon     Past Surgical History:  Procedure Laterality Date  . ABDOMINAL HYSTERECTOMY    . CARDIAC SURGERY     catherization  . HEMORRHOID SURGERY    . TOTAL SHOULDER REPLACEMENT Right     Family History  Problem Relation Age of Onset  . Heart disease Father        heart problems  . Kidney failure Mother   . Diabetes Mother   . Leukemia Brother   . Diabetes Brother   . Colon cancer Sister   . Leukemia Brother   . Diabetes Brother   . Pancreatic cancer Brother   . Bone cancer Sister     Social History   Tobacco Use  . Smoking status: Never Smoker  . Smokeless tobacco: Never Used  Substance Use Topics  .  Alcohol use: No  . Drug use: No     Home Medications Prior to Admission medications   Medication Sig Start Date End Date Taking? Authorizing Provider  acetaminophen (TYLENOL) 500 MG tablet Take 2 tablets (1,000 mg total) by mouth every 6 (six) hours as needed. 07/05/16  Yes Charlesetta Shanks, MD  albuterol (PROVENTIL) (5 MG/ML) 0.5% nebulizer solution Take 0.5 mLs (2.5 mg total) by nebulization every 4 (four) hours as needed for wheezing or shortness of breath. 02/25/13  Yes Ghimire, Henreitta Leber, MD  amLODipine (NORVASC) 10 MG tablet Take 1 tablet (10 mg total) by mouth daily. 03/24/19  Yes Emokpae, Courage, MD  atorvastatin (LIPITOR) 40 MG tablet TAKE 1 TABLET(40 MG) BY MOUTH DAILY AT 6 PM 07/27/19  Yes Nahser, Wonda Cheng, MD  famotidine (PEPCID) 20 MG tablet Take 20 mg by mouth daily. 07/06/19  Yes [provider]  fluticasone (FLONASE) 50 MCG/ACT nasal spray SHAKE LIQUID AND USE 2 SPRAYS IN EACH NOSTRIL DAILY Patient taking differently: Place 2 sprays into both nostrils daily.  12/07/18  Yes Collene Gobble, MD  gabapentin (NEURONTIN) 300 MG capsule Take 300 mg by mouth 3 (three) times daily. 07/07/19  Yes  [provider]  gabapentin (NEURONTIN) 600 MG tablet Take 600 mg by mouth 3 (three) times daily. 08/05/19  Yes [provider]  hydrALAZINE (APRESOLINE) 50 MG tablet Take 1 tablet (50 mg total) by mouth 3 (three) times daily. 03/12/19  Yes Daune Perch, NP  levothyroxine (SYNTHROID, LEVOTHROID) 50 MCG tablet Take 50 mcg by mouth daily.     Yes [provider]  montelukast (SINGULAIR) 10 MG tablet Take 10 mg by mouth daily. 11/10/18  Yes [provider]  Polyvinyl Alcohol-Povidone (MURINE TEARS FOR DRY EYES) 5-6 MG/ML SOLN Place 1 drop into both eyes daily as needed (Dry Eyes).   Yes [provider]  potassium chloride (MICRO-K) 10 MEQ CR capsule Take 10 mEq by mouth daily.  08/15/10  Yes [provider]  traMADol (ULTRAM) 50 MG tablet Take  50 mg by mouth every 4 (four) hours as needed for pain. 08/05/19  Yes [provider]  zolpidem (AMBIEN) 10 MG tablet Take 10 mg by mouth at bedtime as needed for sleep. 08/05/19  Yes [provider]     Allergies    Patient has no known allergies.   Review of Systems   Review of Systems  Constitutional: Negative for fever.  HENT: Negative for congestion and sore throat.   Respiratory: Positive for shortness of breath. Negative for cough.   Cardiovascular: Negative for chest pain.  Gastrointestinal: Negative for abdominal pain, diarrhea, nausea and vomiting.  Genitourinary: Negative for dysuria.  Musculoskeletal: Negative for myalgias.  Skin: Negative for rash.  Neurological: Negative for headaches.  Psychiatric/Behavioral: Negative for behavioral problems.     Physical Exam BP (!) 151/63 (BP Location: Right Arm)   Pulse 84   Temp 97.9 F (36.6 C) (Oral)   Resp 17   Ht 5\' 6"  (1.676 m)   Wt 97.5 kg   SpO2 95%   BMI 34.69 kg/m   Physical Exam Vitals and nursing note reviewed.  Constitutional:      Appearance: Normal appearance.  HENT:     Head: Normocephalic and atraumatic.     Nose: Nose normal.     Mouth/Throat:     Mouth: Mucous membranes are moist.  Eyes:     Extraocular Movements: Extraocular movements intact.     Conjunctiva/sclera: Conjunctivae normal.  Cardiovascular:     Rate and Rhythm: Normal rate.  Pulmonary:     Effort: Pulmonary effort is normal.     Breath sounds: Normal breath sounds.  Abdominal:     General: Abdomen is flat.     Palpations: Abdomen is soft.     Tenderness: There is no abdominal tenderness.  Musculoskeletal:        General: No swelling. Normal range of motion.     Cervical back: Neck supple.  Skin:    General: Skin is warm and dry.  Neurological:     General: No focal deficit present.     Mental Status: She is alert.  Psychiatric:        Mood and Affect: Mood normal.      ED Results / Procedures /  Treatments   Labs (all labs ordered are listed, but only abnormal results are displayed) Labs Reviewed  BASIC METABOLIC PANEL - Abnormal; Notable for the following components:      Result Value   CO2 33 (*)    Glucose, Bld 124 (*)    All other components within normal limits  CBC WITH DIFFERENTIAL/PLATELET - Abnormal; Notable for the following components:  RBC 2.97 (*)    Hemoglobin 9.2 (*)    HCT 30.7 (*)    MCV 103.4 (*)    Abs Immature Granulocytes 0.12 (*)    All other components within normal limits  BRAIN NATRIURETIC PEPTIDE - Abnormal; Notable for the following components:   B Natriuretic Peptide 103.0 (*)    All other components within normal limits  TROPONIN I (HIGH SENSITIVITY) - Abnormal; Notable for the following components:   Troponin I (High Sensitivity) 18 (*)    All other components within normal limits  TROPONIN I (HIGH SENSITIVITY) - Abnormal; Notable for the following components:   Troponin I (High Sensitivity) 19 (*)    All other components within normal limits  RESPIRATORY PANEL BY RT PCR (FLU A&B, COVID)  CBC  COMPREHENSIVE METABOLIC PANEL  POC SARS CORONAVIRUS 2 AG -  ED    EKG EKG Interpretation  Date/Time:  Sunday Aug 08 2019 15:27:53 EDT Ventricular Rate:  86 PR Interval:    QRS Duration: 155 QT Interval:  406 QTC Calculation: 486 R Axis:   108 Text Interpretation: Sinus rhythm Right bundle branch block No significant change since last tracing Confirmed by Calvert Cantor [phone]) on 08/08/2019 3:42:08 PM   Radiology DG Chest Port 1 View  Result Date: 08/08/2019 CLINICAL DATA:  Shortness of breath. EXAM: PORTABLE CHEST 1 VIEW COMPARISON:  03/20/2019 FINDINGS: Lungs are adequately inflated demonstrate hazy prominence of the perihilar vessels suggesting mild vascular congestion. Mild bibasilar opacification suggesting small effusions right greater than left likely with associated atelectasis. Infection in the lung bases is possible. Mild stable  cardiomegaly. Remainder of the exam is unchanged. IMPRESSION: Mild cardiomegaly with findings suggesting mild vascular congestion with small effusions right greater than left and associated basilar atelectasis. Infection in the lung bases is possible. Electronically Signed   By: Marin Olp M.D.   On: 08/08/2019 16:16    Procedures Procedures  Medications Ordered in the ED Medications  traMADol (ULTRAM) tablet 50 mg (50 mg Oral Given 08/08/19 2154)  amLODipine (NORVASC) tablet 10 mg (10 mg Oral Given 08/08/19 2154)  atorvastatin (LIPITOR) tablet 40 mg (40 mg Oral Given 08/08/19 2154)  hydrALAZINE (APRESOLINE) tablet 50 mg (50 mg Oral Given 08/08/19 2154)  zolpidem (AMBIEN) tablet 5 mg (5 mg Oral Given 08/08/19 2154)  levothyroxine (SYNTHROID) tablet 50 mcg (has no administration in time range)  famotidine (PEPCID) tablet 20 mg (20 mg Oral Given 08/08/19 2153)  gabapentin (NEURONTIN) capsule 300 mg (300 mg Oral Given 08/08/19 2154)  potassium chloride SA (KLOR-CON) CR tablet 10 mEq (has no administration in time range)  montelukast (SINGULAIR) tablet 10 mg (has no administration in time range)  enoxaparin (LOVENOX) injection 40 mg (40 mg Subcutaneous Given 08/08/19 2202)  sodium chloride flush (NS) 0.9 % injection 3 mL (3 mLs Intravenous Given 08/08/19 2155)  sodium chloride flush (NS) 0.9 % injection 3 mL (has no administration in time range)  0.9 %  sodium chloride infusion (has no administration in time range)  acetaminophen (TYLENOL) tablet 650 mg (has no administration in time range)    Or  acetaminophen (TYLENOL) suppository 650 mg (has no administration in time range)  ipratropium (ATROVENT) nebulizer solution 0.5 mg (has no administration in time range)  guaiFENesin (MUCINEX) 12 hr tablet 600 mg (600 mg Oral Given 08/08/19 2154)  albuterol (PROVENTIL) (2.5 MG/3ML) 0.083% nebulizer solution 2.5 mg (has no administration in time range)  furosemide (LASIX) injection 40 mg (40 mg Intravenous Given  08/08/19 2041)  ED Course  I have reviewed the triage vital signs and the nursing notes.  Pertinent labs & imaging results that were available during my care of the patient were reviewed by me and considered in my medical decision making (see chart for details).  Clinical Course as of Aug 08 2207  Sun Aug 08, 2019  1551 Patient not wheezing and no hypoxia at the time of my evaluation. She reports symptoms are worse with walking and improved with rest. She has had both doses of Covid vaccine >2 weeks ago. EKG shows chronic RBBB, no ischemic changes. Labs and CXR ordered.    [CS]  [id number] Daughter at the bedside now clarifies that when the patient became hypoxic this morning, EMS was called and they changed her home O2 from 2L to 4L but did not transport her to the ED. The daughter states that later in the day they tried to wean her back down but she became hypoxic again so they called EMS back and that was when she was given a neb and transported to the ED. She has been on 4L during her ED stay with SpO2 in high 90s. Her baseline is ~92%. Will wean back down to 2L and see if she drops again.    [CS]  [id number] Patient drops SpO2 into 80s with turning and repositioning in bed. Unable to get up out of bed without shortness of breath.    [CS]  50 Spoke with Dr. Darrick Meigs, Hospitalist who will evaluate the patient for admission.    [CS]    Clinical Course User Index [CS] Truddie Hidden, MD    MDM Rules/Calculators/A&P MDM  Final Clinical Impression(s) / ED Diagnoses Final diagnoses:  Hypoxia    Rx / DC Orders ED Discharge Orders    None       Truddie Hidden, MD 08/08/19 2209

## 2019-08-08 NOTE — H&P (Signed)
TRH H&P    Patient Demographics:    Sandra Cuevas, is a 86 y.o. female  MRN: [id number]  DOB - 1928-09-08  Admit Date - 08/08/2019  Referring MD/NP/PA: Calvert Cantor  Outpatient Primary MD for the patient is Sharilyn Sites, MD  Patient coming from: Home  Chief complaint-shortness of breath   HPI:    Sandra Cuevas  is a 86 y.o. female, with medical history of hypertension, hypothyroidism, chronic respiratory failure on 2 L/min of oxygen at home, COPD, GERD who was brought to the ED with complaints of shortness of breath.  As per patient she was feeling short of breath with her O2 sat dropping to 70s on regular 2 L point of oxygen at home.  They had to bump up the oxygen to 4 L/min, EMS gave her nebulizer with albuterol in route.  On arrival patient's O2 sats were 84% on 2 L. In the ED, patient initially required 4 L/min of oxygen, and any attempt to decrease oxygen to 2 L/min and dropped her O2 sats to 80s. Chest x-ray showed mild vascular congestion, BNP mildly elevated.  She does have history of diastolic heart failure as per echo from 2013. Patient mated for CP exacerbation and possible acute diastolic heart failure. She denies chest pain Denies nausea vomiting or diarrhea. Denies abdominal pain or dysuria No previous history of stroke or seizures.    Review of systems:    In addition to the HPI above,   All other systems reviewed and are negative.    Past History of the following :    Past Medical History:  Diagnosis Date  . Arthritis   . Asthma   . Atypical chest pain   . 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 COPD (chronic obstructive pulmonary disease) ([place])   . Diabetes mellitus   . GERD (gastroesophageal reflux disease)   . History of cardiovascular stress test 06/01/2010   EF 71% - no evidence of ischemia, normal left ventricular systolic function  . History of hysterectomy  1965  . Hyperlipidemia   . Hypertension   . Hypothyroidism   . Shortness of breath    on exertion  . Tubular adenoma of colon       Past Surgical History:  Procedure Laterality Date  . ABDOMINAL HYSTERECTOMY    . CARDIAC SURGERY     catherization  . HEMORRHOID SURGERY    . TOTAL SHOULDER REPLACEMENT Right       Social History:      Social History   Tobacco Use  . Smoking status: Never Smoker  . Smokeless tobacco: Never Used  Substance Use Topics  . Alcohol use: No       Family History :     Family History  Problem Relation Age of Onset  . Heart disease Father        heart problems  . Kidney failure Mother   . Diabetes Mother   . Leukemia Brother   . Diabetes Brother   . Colon  cancer Sister   . Leukemia Brother   . Diabetes Brother   . Pancreatic cancer Brother   . Bone cancer Sister       Home Medications:   Prior to Admission medications   Medication Sig Start Date End Date Taking? Authorizing Provider  acetaminophen (TYLENOL) 500 MG tablet Take 2 tablets (1,000 mg total) by mouth every 6 (six) hours as needed. 07/05/16  Yes Charlesetta Shanks, MD  albuterol (PROVENTIL) (5 MG/ML) 0.5% nebulizer solution Take 0.5 mLs (2.5 mg total) by nebulization every 4 (four) hours as needed for wheezing or shortness of breath. 02/25/13  Yes Ghimire, Henreitta Leber, MD  amLODipine (NORVASC) 10 MG tablet Take 1 tablet (10 mg total) by mouth daily. 03/24/19  Yes Emokpae, Courage, MD  atorvastatin (LIPITOR) 40 MG tablet TAKE 1 TABLET(40 MG) BY MOUTH DAILY AT 6 PM 07/27/19  Yes Nahser, Wonda Cheng, MD  famotidine (PEPCID) 20 MG tablet Take 20 mg by mouth daily. 07/06/19  Yes [provider]  fluticasone (FLONASE) 50 MCG/ACT nasal spray SHAKE LIQUID AND USE 2 SPRAYS IN EACH NOSTRIL DAILY Patient taking differently: Place 2 sprays into both nostrils daily.  12/07/18  Yes Collene Gobble, MD  gabapentin (NEURONTIN) 300 MG capsule Take 300 mg by mouth 3 (three) times daily. 07/07/19   Yes [provider]  gabapentin (NEURONTIN) 600 MG tablet Take 600 mg by mouth 3 (three) times daily. 08/05/19  Yes [provider]  hydrALAZINE (APRESOLINE) 50 MG tablet Take 1 tablet (50 mg total) by mouth 3 (three) times daily. 03/12/19  Yes Daune Perch, NP  levothyroxine (SYNTHROID, LEVOTHROID) 50 MCG tablet Take 50 mcg by mouth daily.     Yes [provider]  montelukast (SINGULAIR) 10 MG tablet Take 10 mg by mouth daily. 11/10/18  Yes [provider]  Polyvinyl Alcohol-Povidone (MURINE TEARS FOR DRY EYES) 5-6 MG/ML SOLN Place 1 drop into both eyes daily as needed (Dry Eyes).   Yes [provider]  potassium chloride (MICRO-K) 10 MEQ CR capsule Take 10 mEq by mouth daily.  08/15/10  Yes [provider]  traMADol (ULTRAM) 50 MG tablet Take 50 mg by mouth every 4 (four) hours as needed for pain. 08/05/19  Yes [provider]  zolpidem (AMBIEN) 10 MG tablet Take 10 mg by mouth at bedtime as needed for sleep. 08/05/19  Yes [provider]     Allergies:    No Known Allergies   Physical Exam:   Vitals  Blood pressure 136/85, pulse 85, temperature 97.9 F (36.6 C), temperature source Oral, resp. rate 17, height 5' 6" (1.676 m), weight 97.5 kg, SpO2 96 %.  1.  General: Appears in no acute distress  2. Psychiatric: Alert, oriented x4, intact insight and judgment  3. Neurologic: Cranial nerves II through XII grossly intact, no focal deficit, moving all extremities  4. HEENMT:  Atraumatic normocephalic, extraocular muscles are intact  5. Respiratory : Bibasilar crackles auscultated  6. Cardiovascular : S1-S2, regular, no murmur auscultated, trace edema in the lower extremities  7. Gastrointestinal:  Abdominal soft, nontender, no organomegaly     Data Review:    CBC Recent Labs  Lab 08/08/19 1559  WBC 7.9  HGB 9.2*  HCT 30.7*  PLT 172  MCV 103.4*  MCH 31.0  MCHC 30.0  RDW 14.3  LYMPHSABS 1.1    MONOABS 0.9  EOSABS 0.1  BASOSABS 0.0   ------------------------------------------------------------------------------------------------------------------  Results for orders placed or performed during the hospital encounter  of 08/08/19 (from the past 48 hour(s))  Basic metabolic panel     Status: Abnormal   Collection Time: 08/08/19  3:59 PM  Result Value Ref Range   Sodium 137 135 - 145 mmol/L   Potassium 4.4 3.5 - 5.1 mmol/L   Chloride 99 98 - 111 mmol/L   CO2 33 (H) 22 - 32 mmol/L   Glucose, Bld 124 (H) 70 - 99 mg/dL    Comment: Glucose reference range applies only to samples taken after fasting for at least 8 hours.   BUN 18 8 - 23 mg/dL   Creatinine, Ser 0.85 0.44 - 1.00 mg/dL   Calcium 8.9 8.9 - 10.3 mg/dL   GFR calc non Af Amer >60 >60 mL/min   GFR calc Af Amer >60 >60 mL/min   Anion gap 5 5 - 15    Comment: Performed at [facility], [address], [place], [place] [address]  CBC with Differential     Status: Abnormal   Collection Time: 08/08/19  3:59 PM  Result Value Ref Range   WBC 7.9 4.0 - 10.5 K/uL   RBC 2.97 (L) 3.87 - 5.11 MIL/uL   Hemoglobin 9.2 (L) 12.0 - 15.0 g/dL   HCT 30.7 (L) 36.0 - 46.0 %   MCV 103.4 (H) 80.0 - 100.0 fL   MCH 31.0 26.0 - 34.0 pg   MCHC 30.0 30.0 - 36.0 g/dL   RDW 14.3 11.5 - 15.5 %   Platelets 172 150 - 400 K/uL   nRBC 0.0 0.0 - 0.2 %   Neutrophils Relative % 71 %   Neutro Abs 5.7 1.7 - 7.7 K/uL   Lymphocytes Relative 14 %   Lymphs Abs 1.1 0.7 - 4.0 K/uL   Monocytes Relative 12 %   Monocytes Absolute 0.9 0.1 - 1.0 K/uL   Eosinophils Relative 1 %   Eosinophils Absolute 0.1 0.0 - 0.5 K/uL   Basophils Relative 0 %   Basophils Absolute 0.0 0.0 - 0.1 K/uL   Immature Granulocytes 2 %   Abs Immature Granulocytes 0.12 (H) 0.00 - 0.07 K/uL    Comment: Performed at [facility], [address] [place], [place] [address]  Troponin I (High Sensitivity)     Status: Abnormal   Collection Time: 08/08/19  3:59 PM  Result Value Ref Range    Troponin I (High Sensitivity) 18 (H) <18 ng/L    Comment: (NOTE) Elevated high sensitivity troponin I (hsTnI) values and significant  changes across serial measurements may suggest ACS but many other  chronic and acute conditions are known to elevate hsTnI results.  Refer to the "Links" section for chest pain algorithms and additional  guidance. Performed at [facility], [address], [place], [place] [address]   Brain natriuretic peptide     Status: Abnormal   Collection Time: 08/08/19  3:59 PM  Result Value Ref Range   B Natriuretic Peptide 103.0 (H) 0.0 - 100.0 pg/mL    Comment: Performed at [facility], [address] [place], [place] [address]  POC SARS Coronavirus 2 Ag-ED - Nasal Swab (BD Veritor Kit)     Status: None   Collection Time: 08/08/19  4:43 PM  Result Value Ref Range   SARS Coronavirus 2 Ag NEGATIVE NEGATIVE    Comment: (NOTE) SARS-CoV-2 antigen NOT DETECTED.  Negative results are presumptive.  Negative results do not preclude SARS-CoV-2 infection and should not be used as the sole basis for treatment or other patient management decisions, including infection  control decisions, particularly in the presence of clinical signs and  symptoms consistent with COVID-19, or in those who have been in contact with the virus.  Negative results must be combined with clinical observations, patient history, and epidemiological information. The expected result is Negative. Fact Sheet for Patients: PodPark.tn Fact Sheet for Healthcare Providers: GiftContent.is This test is not yet approved or cleared by the [place] FDA and  has been authorized for detection and/or diagnosis of SARS-CoV-2 by FDA under an Emergency Use Authorization (EUA).  This EUA will remain in effect (meaning this test can be used) for the duration of  the COVID-19 de claration under Section 564(b)(1) of the Act, 21 U.S.C. section  360bbb-3(b)(1), unless the authorization is terminated or revoked sooner.   Troponin I (High Sensitivity)     Status: Abnormal   Collection Time: 08/08/19  5:44 PM  Result Value Ref Range   Troponin I (High Sensitivity) 19 (H) <18 ng/L    Comment: (NOTE) Elevated high sensitivity troponin I (hsTnI) values and significant  changes across serial measurements may suggest ACS but many other  chronic and acute conditions are known to elevate hsTnI results.  Refer to the "Links" section for chest pain algorithms and additional  guidance. Performed at [facility], [address], [place], [place] [address]   Respiratory Panel by RT PCR (Flu A&B, Covid) - Nasopharyngeal Swab     Status: None   Collection Time: 08/08/19  7:02 PM   Specimen: Nasopharyngeal Swab  Result Value Ref Range   SARS Coronavirus 2 by RT PCR NEGATIVE NEGATIVE    Comment: (NOTE) SARS-CoV-2 target nucleic acids are NOT DETECTED. The SARS-CoV-2 RNA is generally detectable in upper respiratoy specimens during the acute phase of infection. The lowest concentration of SARS-CoV-2 viral copies this assay can detect is 131 copies/mL. A negative result does not preclude SARS-Cov-2 infection and should not be used as the sole basis for treatment or other patient management decisions. A negative result may occur with  improper specimen collection/handling, submission of specimen other than nasopharyngeal swab, presence of viral mutation(s) within the areas targeted by this assay, and inadequate number of viral copies (<131 copies/mL). A negative result must be combined with clinical observations, patient history, and epidemiological information. The expected result is Negative. Fact Sheet for Patients:  PinkCheek.be Fact Sheet for Healthcare Providers:  GravelBags.it This test is not yet ap proved or cleared by the [place] FDA and  has been authorized for  detection and/or diagnosis of SARS-CoV-2 by FDA under an Emergency Use Authorization (EUA). This EUA will remain  in effect (meaning this test can be used) for the duration of the COVID-19 declaration under Section 564(b)(1) of the Act, 21 U.S.C. section 360bbb-3(b)(1), unless the authorization is terminated or revoked sooner.    Influenza A by PCR NEGATIVE NEGATIVE   Influenza B by PCR NEGATIVE NEGATIVE    Comment: (NOTE) The Xpert Xpress SARS-CoV-2/FLU/RSV assay is intended as an aid in  the diagnosis of influenza from Nasopharyngeal swab specimens and  should not be used as a sole basis for treatment. Nasal washings and  aspirates are unacceptable for Xpert Xpress SARS-CoV-2/FLU/RSV  testing. Fact Sheet for Patients: PinkCheek.be Fact Sheet for Healthcare Providers: GravelBags.it This test is not yet approved or cleared by the [place] FDA and  has been authorized for detection and/or diagnosis of SARS-CoV-2 by  FDA under an Emergency Use Authorization (EUA). This EUA will remain  in effect (meaning this test can  be used) for the duration of the  Covid-19 declaration under Section 564(b)(1) of the Act, 21  U.S.C. section 360bbb-3(b)(1), unless the authorization is  terminated or revoked. Performed at [facility], [address] [place], [place] [address]     Chemistries  Recent Labs  Lab 08/08/19 1559  NA 137  K 4.4  CL 99  CO2 33*  GLUCOSE 124*  BUN 18  CREATININE 0.85  CALCIUM 8.9    --------------------------------------------------------------------------------------------------------------- Urine analysis:    Component Value Date/Time   COLORURINE YELLOW 03/21/2019 0401   APPEARANCEUR CLEAR 03/21/2019 0401   LABSPEC 1.018 03/21/2019 0401   PHURINE 6.0 03/21/2019 0401   GLUCOSEU NEGATIVE 03/21/2019 0401   HGBUR NEGATIVE 03/21/2019 0401   BILIRUBINUR NEGATIVE 03/21/2019 0401   KETONESUR  NEGATIVE 03/21/2019 0401   PROTEINUR NEGATIVE 03/21/2019 0401   UROBILINOGEN 0.2 12/28/2009 0622   NITRITE NEGATIVE 03/21/2019 0401   LEUKOCYTESUR TRACE (A) 03/21/2019 0401      Imaging Results:    DG Chest Port 1 View  Result Date: 08/08/2019 CLINICAL DATA:  Shortness of breath. EXAM: PORTABLE CHEST 1 VIEW COMPARISON:  03/20/2019 FINDINGS: Lungs are adequately inflated demonstrate hazy prominence of the perihilar vessels suggesting mild vascular congestion. Mild bibasilar opacification suggesting small effusions right greater than left likely with associated atelectasis. Infection in the lung bases is possible. Mild stable cardiomegaly. Remainder of the exam is unchanged. IMPRESSION: Mild cardiomegaly with findings suggesting mild vascular congestion with small effusions right greater than left and associated basilar atelectasis. Infection in the lung bases is possible. Electronically Signed   By: Marin Olp M.D.   On: 08/08/2019 16:16    My personal review of EKG: Rhythm NSR, RBBB   Assessment & Plan:    Active Problems:   Acute respiratory failure ([place])   1. Acute on chronic hypoxic respiratory failure-due to combination of COPD exacerbation as well as diastolic heart failure.  Will give 1 dose of Lasix 40 mg IV, start DuoNeb nebulizers every 6 hours.  I do not think that patient has severe COPD at this time, will avoid giving steroids. 2. Acute on chronic diastolic CHF-patient has bilateral lower extremity edema with bilateral crackles on auscultation.  Chest x-ray showing vascular congestion.  1 dose of Lasix given 40 mg IV as above.  Will assess the response and based on that patient might need to go home on low-dose Lasix. 3. Mild COPD exacerbation-patient started on nebulizers as above. 4. Hypothyroidism-continue Synthroid 5. Hypertension-continue amlodipine, hydralazine 6. Hyperlipidemia-continue Lipitor 7. Chronic pain syndrome-continue tramadol, Neurontin   DVT  Prophylaxis-   Lovenox   AM Labs Ordered, also please review Full Orders  Family Communication: Admission, patients condition and plan of care including tests being ordered have been discussed with the patient and her daughter at bedside who indicate understanding and agree with the plan and Code Status.  Code Status: DNR  Admission status: Observation  Time spent in minutes : 60 minutes   Gagan S Lama M.D

## 2019-08-09 ENCOUNTER — Encounter (HOSPITAL_COMMUNITY): Payer: Self-pay | Admitting: Family Medicine

## 2019-08-09 DIAGNOSIS — J9601 Acute respiratory failure with hypoxia: Secondary | ICD-10-CM

## 2019-08-09 DIAGNOSIS — I5031 Acute diastolic (congestive) heart failure: Secondary | ICD-10-CM

## 2019-08-09 LAB — COMPREHENSIVE METABOLIC PANEL
ALT: 20 U/L (ref 0–44)
AST: 23 U/L (ref 15–41)
Albumin: 2.9 g/dL — ABNORMAL LOW (ref 3.5–5.0)
Alkaline Phosphatase: 76 U/L (ref 38–126)
Anion gap: 8 (ref 5–15)
BUN: 23 mg/dL (ref 8–23)
CO2: 33 mmol/L — ABNORMAL HIGH (ref 22–32)
Calcium: 8.9 mg/dL (ref 8.9–10.3)
Chloride: 99 mmol/L (ref 98–111)
Creatinine, Ser: 1.03 mg/dL — ABNORMAL HIGH (ref 0.44–1.00)
GFR calc Af Amer: 56 mL/min — ABNORMAL LOW (ref >60)
GFR calc non Af Amer: 48 mL/min — ABNORMAL LOW (ref >60)
Glucose, Bld: 215 mg/dL — ABNORMAL HIGH (ref 70–99)
Potassium: 4.4 mmol/L (ref 3.5–5.1)
Sodium: 140 mmol/L (ref 135–145)
Total Bilirubin: 0.8 mg/dL (ref 0.3–1.2)
Total Protein: 5.9 g/dL — ABNORMAL LOW (ref 6.5–8.1)

## 2019-08-09 LAB — CBC
HCT: 29 % — ABNORMAL LOW (ref 36.0–46.0)
Hemoglobin: 8.6 g/dL — ABNORMAL LOW (ref 12.0–15.0)
MCH: 30 pg (ref 26.0–34.0)
MCHC: 29.7 g/dL — ABNORMAL LOW (ref 30.0–36.0)
MCV: 101 fL — ABNORMAL HIGH (ref 80.0–100.0)
Platelets: 176 10*3/uL (ref 150–400)
RBC: 2.87 MIL/uL — ABNORMAL LOW (ref 3.87–5.11)
RDW: 13.9 % (ref 11.5–15.5)
WBC: 5.6 10*3/uL (ref 4.0–10.5)
nRBC: 0 % (ref 0.0–0.2)

## 2019-08-09 MED ORDER — IPRATROPIUM-ALBUTEROL 0.5-2.5 (3) MG/3ML IN SOLN
3.0000 mL | Freq: Four times a day (QID) | RESPIRATORY_TRACT | Status: DC
  Administered 2019-08-09: 3 mL via RESPIRATORY_TRACT
  Filled 2019-08-09 (×2): qty 3

## 2019-08-09 MED ORDER — GUAIFENESIN ER 600 MG PO TB12: 600.0000 mg | ORAL_TABLET | Freq: Two times a day (BID) | ORAL | 0 refills | Status: AC | PRN

## 2019-08-09 MED ORDER — SITAGLIPTIN PHOSPHATE 50 MG PO TABS
50.0000 mg | ORAL_TABLET | Freq: Every day | ORAL | 1 refills | Status: DC
Start: 2019-08-09 — End: 2019-08-12

## 2019-08-09 MED ORDER — FUROSEMIDE 20 MG PO TABS: 20.0000 mg | ORAL_TABLET | ORAL | 0 refills | Status: DC

## 2019-08-09 NOTE — Discharge Instructions (Signed)
Shortness of Breath, Adult Shortness of breath means you have trouble breathing. Shortness of breath could be a sign of a medical problem. Follow these instructions at home:   Watch for any changes in your symptoms.  Do not use any products that contain nicotine or tobacco, such as cigarettes, e-cigarettes, and chewing tobacco.  Do not smoke. Smoking can cause shortness of breath. If you need help to quit smoking, ask your doctor.  Avoid things that can make it harder to breathe, such as: ? Mold. ? Dust. ? Air pollution. ? Chemical smells. ? Things that can cause allergy symptoms (allergens), if you have allergies.  Keep your living space clean. Use products that help remove mold and dust.  Rest as needed. Slowly return to your normal activities.  Take over-the-counter and prescription medicines only as told by your doctor. This includes oxygen therapy and inhaled medicines.  Keep all follow-up visits as told by your doctor. This is important. Contact a doctor if:  Your condition does not get better as soon as expected.  You have a hard time doing your normal activities, even after you rest.  You have new symptoms. Get help right away if:  Your shortness of breath gets worse.  You have trouble breathing when you are resting.  You feel light-headed or you pass out (faint).  You have a cough that is not helped by medicines.  You cough up blood.  You have pain with breathing.  You have pain in your chest, arms, shoulders, or belly (abdomen).  You have a fever.  You cannot walk up stairs.  You cannot exercise the way you normally do. These symptoms may represent a serious problem that is an emergency. Do not wait to see if the symptoms will go away. Get medical help right away. Call your local emergency services (911 in the [place] Do not drive yourself to the hospital. Summary  Shortness of breath is when you have trouble breathing enough air. It can be a sign of a  medical problem.  Avoid things that make it hard for you to breathe, such as smoking, pollution, mold, and dust.  Watch for any changes in your symptoms. Contact your doctor if you do not get better or you get worse. This information is not intended to replace advice given to you by your health care provider. Make sure you discuss any questions you have with your health care provider. Document Revised: 08/25/2017 Document Reviewed: 08/25/2017 Elsevier Patient Education  [place].   Hypoxia Hypoxia is a condition that happens when there is a lack of oxygen in the body's tissues and organs. When there is not enough oxygen, organs cannot work as they should. This causes serious problems throughout the body and in the brain. What are the causes? This condition may be caused by:  Exposure to high altitude.  A collapsed lung (pneumothorax).  Lung infection (pneumonia).  Lung injury.  Long-term (chronic) lung disease, such as COPD (chronic obstructive pulmonary disease).  Blood collecting in the chest cavity (hemothorax).  Food, saliva, or vomit getting into the airway (aspiration).  Reduced blood flow (ischemia).  Severe blood loss.  Slow or shallow breathing (hypoventilation).  Blood disorders, such as anemia.  Carbon monoxide poisoning.  The heart suddenly stopping (cardiac arrest).  Anesthetic medicines.  Drowning.  Choking. What are the signs or symptoms? Symptoms of this condition include:  Headache.  Fatigue.  Drowsiness.  Forgetfulness.  Nausea.  Confusion.  Shortness of breath.  Dizziness.  Bluish color of the skin, lips, or nail beds (cyanosis).  Change in consciousness or awareness. If hypoxia is not treated, it can lead to convulsions, loss of consciousness (coma), or brain damage. How is this diagnosed? This condition may be diagnosed based on:  A physical exam.  Blood tests.  A test that measures how much oxygen is in your  blood (pulse oximetry). This is done with a sensor that is placed on your finger, toe, or earlobe.  Chest X-ray.  Tests to check your lung function (pulmonary function tests).  A test to check the electrical activity of your heart (electrocardiogram, ECG). You may have other tests to determine the cause of your hypoxia. How is this treated?  Treatment for this condition depends on what is causing the hypoxia. You will likely be treated with oxygen therapy. This may be done by giving you oxygen through a face mask or through tubes in your nose. Your health care provider may also recommend other therapies to treat the underlying cause of your hypoxia. Follow these instructions at home:  Take over-the-counter and prescription medicines only as told by your health care provider.  Do not use any products that contain nicotine or tobacco, such as cigarettes and e-cigarettes. If you need help quitting, ask your health care provider.  Avoid secondhand smoke.  Work with your health care provider to manage any chronic conditions you have that may be causing hypoxia, such as COPD.  Keep all follow-up visits as told by your health care provider. This is important. Contact a health care provider if:  You have a fever.  You have trouble breathing, even after treatment.  You become extremely short of breath when you exercise. Get help right away if:  Your shortness of breath gets worse, especially with normal or very little activity.  Your skin, lips, or nail beds have a bluish color.  You become confused or you cannot think properly.  You have chest pain. Summary  Hypoxia is a condition that happens when there is a lack of oxygen in the body's tissues and organs.  If hypoxia is not treated, it can lead to convulsions, loss of consciousness (coma), or brain damage.  Symptoms of hypoxia can include a headache, shortness of breath, confusion, nausea, and a bluish skin color.  Hypoxia  has many possible causes, including exposure to high altitude, carbon monoxide poisoning, or other health issues, such as blood disorders or cardiac arrest.  Hypoxia is usually treated with oxygen therapy. This information is not intended to replace advice given to you by your health care provider. Make sure you discuss any questions you have with your health care provider. Document Revised: 03/07/2017 Document Reviewed: 05/13/2016 Elsevier Patient Education  [place].   IMPORTANT INFORMATION: PAY CLOSE ATTENTION   PHYSICIAN DISCHARGE INSTRUCTIONS  Follow with Primary care provider  Sharilyn Sites, MD  and other consultants as instructed by your Hospitalist Physician  [place] IF SYMPTOMS COME BACK, WORSEN OR NEW PROBLEM DEVELOPS   Please note: You were cared for by a hospitalist during your hospital stay. Every effort will be made to forward records to your primary care provider.  You can request that your primary care provider send for your hospital records if they have not received them.  Once you are discharged, your primary care physician will handle any further medical issues. Please note that NO REFILLS for any discharge medications will be authorized once you are discharged, as  it is imperative that you return to your primary care physician (or establish a relationship with a primary care physician if you do not have one) for your post hospital discharge needs so that they can reassess your need for medications and monitor your lab values.  Please get a complete blood count and chemistry panel checked by your Primary MD at your next visit, and again as instructed by your Primary MD.  Get Medicines reviewed and adjusted: Please take all your medications with you for your next visit with your Primary MD  Laboratory/radiological data: Please request your Primary MD to go over all hospital tests and procedure/radiological results at the  follow up, please ask your primary care provider to get all Hospital records sent to his/her office.  In some cases, they will be blood work, cultures and biopsy results pending at the time of your discharge. Please request that your primary care provider follow up on these results.  If you are diabetic, please bring your blood sugar readings with you to your follow up appointment with primary care.    Please call and make your follow up appointments as soon as possible.    Also Note the following: If you experience worsening of your admission symptoms, develop shortness of breath, life threatening emergency, suicidal or homicidal thoughts you must seek medical attention immediately by calling 911 or calling your MD immediately  if symptoms less severe.  You must read complete instructions/literature along with all the possible adverse reactions/side effects for all the Medicines you take and that have been prescribed to you. Take any new Medicines after you have completely understood and accpet all the possible adverse reactions/side effects.   Do not drive when taking Pain medications or sleeping medications (Benzodiazepines)  Do not take more than prescribed Pain, Sleep and Anxiety Medications. It is not advisable to combine anxiety,sleep and pain medications without talking with your primary care practitioner  Special Instructions: If you have smoked or chewed Tobacco  in the last 2 yrs please stop smoking, stop any regular Alcohol  and or any Recreational drug use.  Wear Seat belts while driving.  Do not drive if taking any narcotic, mind altering or controlled substances or recreational drugs or alcohol.

## 2019-08-09 NOTE — Evaluation (Signed)
Physical Therapy Evaluation Patient Details Name: Sandra Cuevas MRN: [id number] DOB: 12-04-1928 Today's Date: 08/09/2019   History of Present Illness  Sandra Cuevas  is a 86 y.o. female, with medical history of hypertension, hypothyroidism, chronic respiratory failure on 2 L/min of oxygen at home, COPD, GERD who was brought to the ED with complaints of shortness of breath.  As per patient she was feeling short of breath with her O2 sat dropping to 70s on regular 2 L point of oxygen at home.  They had to bump up the oxygen to 4 L/min, EMS gave her nebulizer with albuterol in route.  On arrival patient's O2 sats were 84% on 2 L.In the ED, patient initially required 4 L/min of oxygen, and any attempt to decrease oxygen to 2 L/min and dropped her O2 sats to 80s.Chest x-ray showed mild vascular congestion, BNP mildly elevated.  She does have history of diastolic heart failure as per echo from 2013.Patient mated for CP exacerbation and possible acute diastolic heart failure.    Clinical Impression  Patient functioning near baseline for functional mobility and gait, has to lean on nearby objects for support when not using AD, required use of RW for safety, able to transfer to commode in bathroom safely and ambulated in hallway using RW without loss of balance while on 2 LPM O2 with SpO2 at 87%.  Patient tolerated sitting up in chair after walking with SpO2 increasing to 91% while on 2 LPM O2.  Patient will benefit from continued physical therapy in hospital and recommended venue below to increase strength, balance, endurance for safe ADLs and gait.      Follow Up Recommendations Home health PT;Supervision - Intermittent;Supervision for mobility/OOB    Equipment Recommendations  None recommended by PT    Recommendations for Other Services       Precautions / Restrictions Precautions Precautions: Fall Restrictions Weight Bearing Restrictions: No      Mobility  Bed Mobility Overal bed mobility:  Modified Independent                Transfers Overall transfer level: Needs assistance Equipment used: None;Rolling walker (2 wheeled) Transfers: Sit to/from [employer] Transfers Sit to Stand: Supervision Stand pivot transfers: Supervision       General transfer comment: has to lean on nearby objects for support without AD, safer using RW  Ambulation/Gait Ambulation/Gait assistance: Supervision;Min guard Gait Distance (Feet): 70 Feet Assistive device: Rolling walker (2 wheeled) Gait Pattern/deviations: Decreased step length - right;Decreased step length - left;Decreased stride length Gait velocity: decreased   General Gait Details: has to lean on nearby objects for support without AD, safer using RW demonstrating slightly labored cadence without loss of balance, limited secondary to fatigue  Stairs            Wheelchair Mobility    Modified Rankin (Stroke Patients Only)       Balance Overall balance assessment: Needs assistance Sitting-balance support: Feet supported;No upper extremity supported Sitting balance-Leahy Scale: Good Sitting balance - Comments: seated at EOB   Standing balance support: During functional activity;No upper extremity supported Standing balance-Leahy Scale: Poor Standing balance comment: fair/poor without AD, fair/good using RW                             Pertinent Vitals/Pain Pain Assessment: No/denies pain    Home Living Family/patient expects to be discharged to:: Private residence Living Arrangements: Alone Available Help at Discharge: Family;Available PRN/intermittently  Type of Home: House Home Access: Stairs to enter Entrance Stairs-Rails: None Entrance Stairs-Number of Steps: 1 Home Layout: One level Home Equipment: Walker - 2 wheels;Shower seat;Bedside commode;Wheelchair - manual;Cane - single point      Prior Function Level of Independence: Independent with assistive device(s)          Comments: Household ambulator without AD, uses SPC or RW PRN     Hand Dominance        Extremity/Trunk Assessment   Upper Extremity Assessment Upper Extremity Assessment: Overall WFL for tasks assessed    Lower Extremity Assessment Lower Extremity Assessment: Generalized weakness    Cervical / Trunk Assessment Cervical / Trunk Assessment: Normal  Communication   Communication: No difficulties  Cognition Arousal/Alertness: Awake/alert Behavior During Therapy: WFL for tasks assessed/performed Overall Cognitive Status: Within Functional Limits for tasks assessed                                        General Comments      Exercises     Assessment/Plan    PT Assessment Patient needs continued PT services  PT Problem List Decreased strength;Decreased activity tolerance;Decreased balance;Decreased mobility       PT Treatment Interventions Balance training;Gait training;Stair training;Functional mobility training;Therapeutic activities;Therapeutic exercise;Patient/family education    PT Goals (Current goals can be found in the Care Plan section)  Acute Rehab PT Goals Patient Stated Goal: return home with family to assist PT Goal Formulation: With patient Time For Goal Achievement: 08/11/19 Potential to Achieve Goals: Good    Frequency Min 3X/week   Barriers to discharge        Co-evaluation               AM-PAC PT "6 Clicks" Mobility  Outcome Measure Help needed turning from your back to your side while in a flat bed without using bedrails?: None Help needed moving from lying on your back to sitting on the side of a flat bed without using bedrails?: None Help needed moving to and from a bed to a chair (including a wheelchair)?: A Little Help needed standing up from a chair using your arms (e.g., wheelchair or bedside chair)?: None Help needed to walk in hospital room?: A Little Help needed climbing 3-5 steps with a railing? : A  Little 6 Click Score: 21    End of Session Equipment Utilized During Treatment: Oxygen Activity Tolerance: Patient tolerated treatment well;Patient limited by fatigue Patient left: in chair;with call bell/phone within reach Nurse Communication: Mobility status PT Visit Diagnosis: Unsteadiness on feet (R26.81);Other abnormalities of gait and mobility (R26.89);Muscle weakness (generalized) (M62.81)    Time: 0802-0829 PT Time Calculation (min) (ACUTE ONLY): 27 min   Charges:   PT Evaluation $PT Eval Moderate Complexity: 1 Mod PT Treatments $Therapeutic Activity: 23-37 mins        9:16 AM, 08/09/19 Lonell Grandchild, MPT Physical Therapist with [facility] [phone] office [phone] mobile phone

## 2019-08-09 NOTE — TOC Transition Note (Signed)
Transition of Care [facility]) - CM/SW Discharge Note   Patient Details  Name: Sandra Cuevas MRN: [id number] Date of Birth: 1928/06/04  Transition of [facility]) CM/SW Contact:  Boneta Lucks, RN Phone Number: 08/09/2019, 4:17 PM   Clinical Narrative:   Patient admitted under observation for acute respiratory failure.  PT recommended HHPT. Patient states she just finished home health PT and thinks she is doing well. She has a cane and walker. States he fluid level was to high and she was SOB. TOC discussed CHF prevention, patient does not weigh daily. Patient will start to do daily weights and follow up with PCP.  Patient walked 70 feet, she does not want HH at this time.         Patient Goals and CMS Choice        Discharge Placement                       Discharge Plan and Services                                     Social Determinants of Health (SDOH) Interventions     Readmission Risk Interventions No flowsheet data found.

## 2019-08-09 NOTE — Discharge Summary (Addendum)
Physician Discharge Summary  Sandra Cuevas [id number] DOB: 1928/11/10 DOA: 08/08/2019  PCP: Sharilyn Sites, MD Cardiologist: Nahser MD  ' Admit date: 08/08/2019 Discharge date: 08/09/2019  Admitted From:  Home  Disposition:  Home with [facility] services  Recommendations for Outpatient Follow-up:  1. Follow up with PCP in 1 weeks 2. Follow up with cardiology in 2 weeks 3. Please check BMP in 1-2 weeks   Home Health: PT   Discharge Condition: STABLE   CODE STATUS: DNR    Brief Hospitalization Summary: Please see all hospital notes, images, labs for full details of the hospitalization. ADMISSION HPI:  Sandra Cuevas  is a 86 y.o. female, with medical history of hypertension, hypothyroidism, chronic respiratory failure on 2 L/min of oxygen at home, COPD, GERD who was brought to the ED with complaints of shortness of breath.  As per patient she was feeling short of breath with her O2 sat dropping to 70s on regular 2 L point of oxygen at home.  They had to bump up the oxygen to 4 L/min, EMS gave her nebulizer with albuterol in route.  On arrival patient's O2 sats were 84% on 2 L. In the ED, patient initially required 4 L/min of oxygen, and any attempt to decrease oxygen to 2 L/min and dropped her O2 sats to 80s.  Chest x-ray showed mild vascular congestion, BNP mildly elevated.  She does have history of diastolic heart failure as per echo from 2013.  Patient mated for CP exacerbation and possible acute diastolic heart failure.  She denies chest pain  Denies nausea vomiting or diarrhea.  Denies abdominal pain or dysuria.  No previous history of stroke or seizures.  This patient was admitted for a mild COPD exacerbation and acute diastolic CHF exacerbation.  She was given 40 mg of Lasix IV and responded very well diuresing over 1.5 L of fluid and now she is feeling much better.  She has been ambulating the hallway.  She worked with physical therapy and they have recommended home health PT which she is agreeable  to.  She would like to go home.  She is now back to her baseline 2 L nasal cannula oxygen setting that she is chronically on at home.  She feels well to go home.  Close outpatient follow-up recommended with her PCP and cardiologist.  I am sending her home on Lasix 20 mg every other day for short course over 10 days.  She is already on a potassium supplement that she takes daily.  Recheck labs when she follows up with her PCP and cardiologist.  Patient advised to seek medical care or return to hospital if symptoms return worsen or new problems develop.  The patient verbalizes understanding.  She does not appear to be on anything for her diabetes mellitus.  Her blood sugar was 215 in the hospital today.  Patient given Rx for Januvia 50 mg daily.  Follow-up with PCP for ongoing management.  Discharge Diagnoses:  Active Problems:   Acute respiratory failure [facility])   Discharge Instructions:  Allergies as of 08/09/2019   No Known Allergies     Medication List    TAKE these medications   acetaminophen 500 MG tablet Commonly known as: TYLENOL Take 2 tablets (1,000 mg total) by mouth every 6 (six) hours as needed.   albuterol (5 MG/ML) 0.5% nebulizer solution Commonly known as: PROVENTIL Take 0.5 mLs (2.5 mg total) by nebulization every 4 (four) hours as needed for wheezing or shortness of breath.  amLODipine 10 MG tablet Commonly known as: NORVASC Take 1 tablet (10 mg total) by mouth daily.   atorvastatin 40 MG tablet Commonly known as: LIPITOR TAKE 1 TABLET(40 MG) BY MOUTH DAILY AT 6 PM   famotidine 20 MG tablet Commonly known as: PEPCID Take 20 mg by mouth daily.   fluticasone 50 MCG/ACT nasal spray Commonly known as: FLONASE SHAKE LIQUID AND USE 2 SPRAYS IN EACH NOSTRIL DAILY What changed: See the new instructions.   furosemide 20 MG tablet Commonly known as: Lasix Take 1 tablet (20 mg total) by mouth every other day for 10 days. Start taking on: Aug 10, 2019   gabapentin 300 MG  capsule Commonly known as: NEURONTIN Take 300 mg by mouth 3 (three) times daily.   gabapentin 600 MG tablet Commonly known as: NEURONTIN Take 600 mg by mouth 3 (three) times daily.   guaiFENesin 600 MG 12 hr tablet Commonly known as: MUCINEX Take 1 tablet (600 mg total) by mouth 2 (two) times daily as needed for up to 5 days for to loosen phlegm.   hydrALAZINE 50 MG tablet Commonly known as: APRESOLINE Take 1 tablet (50 mg total) by mouth 3 (three) times daily.   levothyroxine 50 MCG tablet Commonly known as: SYNTHROID Take 50 mcg by mouth daily.   montelukast 10 MG tablet Commonly known as: SINGULAIR Take 10 mg by mouth daily.   Murine Tears for Dry Eyes 5-6 MG/ML Soln Generic drug: Polyvinyl Alcohol-Povidone Place 1 drop into both eyes daily as needed (Dry Eyes).   potassium chloride 10 MEQ CR capsule Commonly known as: MICRO-K Take 10 mEq by mouth daily.   sitaGLIPtin 50 MG tablet Commonly known as: Januvia Take 1 tablet (50 mg total) by mouth daily.   traMADol 50 MG tablet Commonly known as: ULTRAM Take 50 mg by mouth every 4 (four) hours as needed for pain.   zolpidem 10 MG tablet Commonly known as: AMBIEN Take 10 mg by mouth at bedtime as needed for sleep.      Follow-up Information    Sharilyn Sites, MD. Schedule an appointment as soon as possible for a visit in 1 week(s).   Specialty: Family Medicine Contact information: [address] [place] [id number] [phone]        Nahser, Wonda Cheng, MD. Schedule an appointment as soon as possible for a visit in 2 week(s).   Specialty: Cardiology Contact information: [address] [place]  [address] [phone]          No Known Allergies Allergies as of 08/09/2019   No Known Allergies     Medication List    TAKE these medications   acetaminophen 500 MG tablet Commonly known as: TYLENOL Take 2 tablets (1,000 mg total) by mouth every 6 (six) hours as needed.    albuterol (5 MG/ML) 0.5% nebulizer solution Commonly known as: PROVENTIL Take 0.5 mLs (2.5 mg total) by nebulization every 4 (four) hours as needed for wheezing or shortness of breath.   amLODipine 10 MG tablet Commonly known as: NORVASC Take 1 tablet (10 mg total) by mouth daily.   atorvastatin 40 MG tablet Commonly known as: LIPITOR TAKE 1 TABLET(40 MG) BY MOUTH DAILY AT 6 PM   famotidine 20 MG tablet Commonly known as: PEPCID Take 20 mg by mouth daily.   fluticasone 50 MCG/ACT nasal spray Commonly known as: FLONASE SHAKE LIQUID AND USE 2 SPRAYS IN EACH NOSTRIL DAILY What changed: See the new instructions.   furosemide 20  MG tablet Commonly known as: Lasix Take 1 tablet (20 mg total) by mouth every other day for 10 days. Start taking on: Aug 10, 2019   gabapentin 300 MG capsule Commonly known as: NEURONTIN Take 300 mg by mouth 3 (three) times daily.   gabapentin 600 MG tablet Commonly known as: NEURONTIN Take 600 mg by mouth 3 (three) times daily.   guaiFENesin 600 MG 12 hr tablet Commonly known as: MUCINEX Take 1 tablet (600 mg total) by mouth 2 (two) times daily as needed for up to 5 days for to loosen phlegm.   hydrALAZINE 50 MG tablet Commonly known as: APRESOLINE Take 1 tablet (50 mg total) by mouth 3 (three) times daily.   levothyroxine 50 MCG tablet Commonly known as: SYNTHROID Take 50 mcg by mouth daily.   montelukast 10 MG tablet Commonly known as: SINGULAIR Take 10 mg by mouth daily.   Murine Tears for Dry Eyes 5-6 MG/ML Soln Generic drug: Polyvinyl Alcohol-Povidone Place 1 drop into both eyes daily as needed (Dry Eyes).   potassium chloride 10 MEQ CR capsule Commonly known as: MICRO-K Take 10 mEq by mouth daily.   sitaGLIPtin 50 MG tablet Commonly known as: Januvia Take 1 tablet (50 mg total) by mouth daily.   traMADol 50 MG tablet Commonly known as: ULTRAM Take 50 mg by mouth every 4 (four) hours as needed for pain.   zolpidem 10 MG  tablet Commonly known as: AMBIEN Take 10 mg by mouth at bedtime as needed for sleep.       Procedures/Studies: DG Chest Port 1 View  Result Date: 08/08/2019 CLINICAL DATA:  Shortness of breath. EXAM: PORTABLE CHEST 1 VIEW COMPARISON:  03/20/2019 FINDINGS: Lungs are adequately inflated demonstrate hazy prominence of the perihilar vessels suggesting mild vascular congestion. Mild bibasilar opacification suggesting small effusions right greater than left likely with associated atelectasis. Infection in the lung bases is possible. Mild stable cardiomegaly. Remainder of the exam is unchanged. IMPRESSION: Mild cardiomegaly with findings suggesting mild vascular congestion with small effusions right greater than left and associated basilar atelectasis. Infection in the lung bases is possible. Electronically Signed   By: Marin Olp M.D.   On: 08/08/2019 16:16     Subjective: Patient ambulated the halls today with physical therapy.  She says she is feeling better her shortness of breath is resolved.  She says she has urinated a lot since she was admitted.  Discharge Exam: Vitals:   08/09/19 0721 08/09/19 0743  BP:    Pulse:    Resp:    Temp:    SpO2: 95% 95%   Vitals:   08/08/19 2331 08/09/19 0649 08/09/19 0721 08/09/19 0743  BP:  129/61    Pulse:  87    Resp:  20    Temp:  98.3 F (36.8 C)    TempSrc:  Oral    SpO2: 94% 95% 95% 95%  Weight:      Height:       General: Pt is alert, awake, not in acute distress, nasal cannula in place, speaking full sentences Cardiovascular: Normal S1/S2 +, no rubs, no gallops Respiratory: CTA bilaterally, no wheezing, no rhonchi, no crackles heard Abdominal: Soft, NT, ND, bowel sounds + Extremities: no edema, no cyanosis   The results of significant diagnostics from this hospitalization (including imaging, microbiology, ancillary and laboratory) are listed below for reference.     Microbiology: Recent Results (from the past 240 hour(s))   Respiratory Panel by RT PCR (Flu A&B, Covid) -  Nasopharyngeal Swab     Status: None   Collection Time: 08/08/19  7:02 PM   Specimen: Nasopharyngeal Swab  Result Value Ref Range Status   SARS Coronavirus 2 by RT PCR NEGATIVE NEGATIVE Final    Comment: (NOTE) SARS-CoV-2 target nucleic acids are NOT DETECTED. The SARS-CoV-2 RNA is generally detectable in upper respiratoy specimens during the acute phase of infection. The lowest concentration of SARS-CoV-2 viral copies this assay can detect is 131 copies/mL. A negative result does not preclude SARS-Cov-2 infection and should not be used as the sole basis for treatment or other patient management decisions. A negative result may occur with  improper specimen collection/handling, submission of specimen other than nasopharyngeal swab, presence of viral mutation(s) within the areas targeted by this assay, and inadequate number of viral copies (<131 copies/mL). A negative result must be combined with clinical observations, patient history, and epidemiological information. The expected result is Negative. Fact Sheet for Patients:  PinkCheek.be Fact Sheet for Healthcare Providers:  GravelBags.it This test is not yet ap proved or cleared by the [place] FDA and  has been authorized for detection and/or diagnosis of SARS-CoV-2 by FDA under an Emergency Use Authorization (EUA). This EUA will remain  in effect (meaning this test can be used) for the duration of the COVID-19 declaration under Section 564(b)(1) of the Act, 21 U.S.C. section 360bbb-3(b)(1), unless the authorization is terminated or revoked sooner.    Influenza A by PCR NEGATIVE NEGATIVE Final   Influenza B by PCR NEGATIVE NEGATIVE Final    Comment: (NOTE) The Xpert Xpress SARS-CoV-2/FLU/RSV assay is intended as an aid in  the diagnosis of influenza from Nasopharyngeal swab specimens and  should not be used as a sole  basis for treatment. Nasal washings and  aspirates are unacceptable for Xpert Xpress SARS-CoV-2/FLU/RSV  testing. Fact Sheet for Patients: PinkCheek.be Fact Sheet for Healthcare Providers: GravelBags.it This test is not yet approved or cleared by the [place] FDA and  has been authorized for detection and/or diagnosis of SARS-CoV-2 by  FDA under an Emergency Use Authorization (EUA). This EUA will remain  in effect (meaning this test can be used) for the duration of the  Covid-19 declaration under Section 564(b)(1) of the Act, 21  U.S.C. section 360bbb-3(b)(1), unless the authorization is  terminated or revoked. Performed at [facility], [address] [place], [place] [address]      Labs: BNP (last 3 results) Recent Labs    08/08/19 1559  BNP [id number]*   Basic Metabolic Panel: Recent Labs  Lab 08/08/19 1559 08/09/19 0508  NA 137 140  K 4.4 4.4  CL 99 99  CO2 33* 33*  GLUCOSE 124* 215*  BUN 18 23  CREATININE 0.85 1.03*  CALCIUM 8.9 8.9   Liver Function Tests: Recent Labs  Lab 08/09/19 0508  AST 23  ALT 20  ALKPHOS 76  BILITOT 0.8  PROT 5.9*  ALBUMIN 2.9*   No results for input(s): LIPASE, AMYLASE in the last 168 hours. No results for input(s): AMMONIA in the last 168 hours. CBC: Recent Labs  Lab 08/08/19 1559 08/09/19 0508  WBC 7.9 5.6  NEUTROABS 5.7  --   HGB 9.2* 8.6*  HCT 30.7* 29.0*  MCV 103.4* 101.0*  PLT 172 176   Cardiac Enzymes: No results for input(s): CKTOTAL, CKMB, CKMBINDEX, TROPONINI in the last 168 hours. BNP: Invalid input(s): POCBNP CBG: No results for input(s): GLUCAP in the last 168 hours. D-Dimer No results for input(s): DDIMER in the  last 72 hours. Hgb A1c No results for input(s): HGBA1C in the last 72 hours. Lipid Profile No results for input(s): CHOL, HDL, LDLCALC, TRIG, CHOLHDL, LDLDIRECT in the last 72 hours. Thyroid function studies No results for input(s):  TSH, T4TOTAL, T3FREE, THYROIDAB in the last 72 hours.  Invalid input(s): FREET3 Anemia work up No results for input(s): VITAMINB12, FOLATE, FERRITIN, TIBC, IRON, RETICCTPCT in the last 72 hours. Urinalysis    Component Value Date/Time   COLORURINE YELLOW 03/21/2019 0401   APPEARANCEUR CLEAR 03/21/2019 0401   LABSPEC 1.018 03/21/2019 0401   PHURINE 6.0 03/21/2019 0401   GLUCOSEU NEGATIVE 03/21/2019 0401   HGBUR NEGATIVE 03/21/2019 0401   BILIRUBINUR NEGATIVE 03/21/2019 0401   KETONESUR NEGATIVE 03/21/2019 0401   PROTEINUR NEGATIVE 03/21/2019 0401   UROBILINOGEN 0.2 12/28/2009 0622   NITRITE NEGATIVE 03/21/2019 0401   LEUKOCYTESUR TRACE (A) 03/21/2019 0401   Sepsis Labs Invalid input(s): PROCALCITONIN,  WBC,  LACTICIDVEN Microbiology Recent Results (from the past 240 hour(s))  Respiratory Panel by RT PCR (Flu A&B, Covid) - Nasopharyngeal Swab     Status: None   Collection Time: 08/08/19  7:02 PM   Specimen: Nasopharyngeal Swab  Result Value Ref Range Status   SARS Coronavirus 2 by RT PCR NEGATIVE NEGATIVE Final    Comment: (NOTE) SARS-CoV-2 target nucleic acids are NOT DETECTED. The SARS-CoV-2 RNA is generally detectable in upper respiratoy specimens during the acute phase of infection. The lowest concentration of SARS-CoV-2 viral copies this assay can detect is 131 copies/mL. A negative result does not preclude SARS-Cov-2 infection and should not be used as the sole basis for treatment or other patient management decisions. A negative result may occur with  improper specimen collection/handling, submission of specimen other than nasopharyngeal swab, presence of viral mutation(s) within the areas targeted by this assay, and inadequate number of viral copies (<131 copies/mL). A negative result must be combined with clinical observations, patient history, and epidemiological information. The expected result is Negative. Fact Sheet for Patients:   PinkCheek.be Fact Sheet for Healthcare Providers:  GravelBags.it This test is not yet ap proved or cleared by the [place] FDA and  has been authorized for detection and/or diagnosis of SARS-CoV-2 by FDA under an Emergency Use Authorization (EUA). This EUA will remain  in effect (meaning this test can be used) for the duration of the COVID-19 declaration under Section 564(b)(1) of the Act, 21 U.S.C. section 360bbb-3(b)(1), unless the authorization is terminated or revoked sooner.    Influenza A by PCR NEGATIVE NEGATIVE Final   Influenza B by PCR NEGATIVE NEGATIVE Final    Comment: (NOTE) The Xpert Xpress SARS-CoV-2/FLU/RSV assay is intended as an aid in  the diagnosis of influenza from Nasopharyngeal swab specimens and  should not be used as a sole basis for treatment. Nasal washings and  aspirates are unacceptable for Xpert Xpress SARS-CoV-2/FLU/RSV  testing. Fact Sheet for Patients: PinkCheek.be Fact Sheet for Healthcare Providers: GravelBags.it This test is not yet approved or cleared by the [place] FDA and  has been authorized for detection and/or diagnosis of SARS-CoV-2 by  FDA under an Emergency Use Authorization (EUA). This EUA will remain  in effect (meaning this test can be used) for the duration of the  Covid-19 declaration under Section 564(b)(1) of the Act, 21  U.S.C. section 360bbb-3(b)(1), unless the authorization is  terminated or revoked. Performed at [facility], [address], [place], [place] [address]    Time coordinating discharge:   SIGNED:  Irwin Brakeman,  MD  [facility] 08/09/2019, 10:47 AM How to contact the [facility] Attending or Consulting provider [place] or covering provider during after hours [place], for this patient?  1. Check the care team in [facility] and look for a) attending/consulting [facility] provider listed and b) the [facility]  team listed 2. Log into [email or web address] and use [place]'s universal password to access. If you do not have the password, please contact the hospital operator. 3. Locate the [facility] provider you are looking for under [facility] and page to a number that you can be directly reached. 4. If you still have difficulty reaching the provider, please page the [facility] (Director on Call) for the Hospitalists listed on amion for assistance.

## 2019-08-09 NOTE — Plan of Care (Signed)
  Problem: Acute Rehab PT Goals(only PT should resolve) Goal: Pt Will Go Supine/Side To Sit Outcome: Progressing Flowsheets (Taken 08/09/2019 0917) Pt will go Supine/Side to Sit: Independently Goal: Patient Will Transfer Sit To/From Stand Outcome: Progressing Flowsheets (Taken 08/09/2019 0917) Patient will transfer sit to/from stand: with modified independence Goal: Pt Will Transfer Bed To Chair/Chair To Bed Outcome: Progressing Flowsheets (Taken 08/09/2019 0917) Pt will Transfer Bed to Chair/Chair to Bed: with modified independence Goal: Pt Will Ambulate Outcome: Progressing Flowsheets (Taken 08/09/2019 0917) Pt will Ambulate:  100 feet  with supervision  with rolling walker  with modified independence   9:18 AM, 08/09/19 Lonell Grandchild, MPT Physical Therapist with [facility] [phone] office [phone] mobile phone

## 2019-08-10 ENCOUNTER — Encounter (HOSPITAL_COMMUNITY): Payer: Self-pay | Admitting: Emergency Medicine

## 2019-08-10 ENCOUNTER — Emergency Department (HOSPITAL_COMMUNITY): Payer: PPO

## 2019-08-10 ENCOUNTER — Inpatient Hospital Stay (HOSPITAL_COMMUNITY)
Admission: EM | Admit: 2019-08-10 | Discharge: 2019-08-12 | DRG: 291 | Disposition: A | Discharge status: Home Health | Payer: PPO | Attending: Internal Medicine | Admitting: Internal Medicine

## 2019-08-10 DIAGNOSIS — J441 Chronic obstructive pulmonary disease with (acute) exacerbation: Secondary | ICD-10-CM | POA: Diagnosis present

## 2019-08-10 DIAGNOSIS — M1990 Unspecified osteoarthritis, unspecified site: Secondary | ICD-10-CM | POA: Diagnosis present

## 2019-08-10 DIAGNOSIS — J90 Pleural effusion, not elsewhere classified: Secondary | ICD-10-CM | POA: Diagnosis present

## 2019-08-10 DIAGNOSIS — Z841 Family history of disorders of kidney and ureter: Secondary | ICD-10-CM

## 2019-08-10 DIAGNOSIS — N179 Acute kidney failure, unspecified: Secondary | ICD-10-CM | POA: Diagnosis present

## 2019-08-10 DIAGNOSIS — N1832 Chronic kidney disease, stage 3b: Secondary | ICD-10-CM | POA: Diagnosis present

## 2019-08-10 DIAGNOSIS — E1122 Type 2 diabetes mellitus with diabetic chronic kidney disease: Secondary | ICD-10-CM | POA: Diagnosis present

## 2019-08-10 DIAGNOSIS — G894 Chronic pain syndrome: Secondary | ICD-10-CM | POA: Diagnosis present

## 2019-08-10 DIAGNOSIS — J9611 Chronic respiratory failure with hypoxia: Secondary | ICD-10-CM | POA: Diagnosis present

## 2019-08-10 DIAGNOSIS — Z800 Family history of malignant neoplasm of digestive organs: Secondary | ICD-10-CM

## 2019-08-10 DIAGNOSIS — Z20822 Contact with and (suspected) exposure to covid-19: Secondary | ICD-10-CM | POA: Diagnosis present

## 2019-08-10 DIAGNOSIS — Z8249 Family history of ischemic heart disease and other diseases of the circulatory system: Secondary | ICD-10-CM

## 2019-08-10 DIAGNOSIS — Z96611 Presence of right artificial shoulder joint: Secondary | ICD-10-CM | POA: Diagnosis present

## 2019-08-10 DIAGNOSIS — Z79890 Hormone replacement therapy (postmenopausal): Secondary | ICD-10-CM

## 2019-08-10 DIAGNOSIS — Z9981 Dependence on supplemental oxygen: Secondary | ICD-10-CM

## 2019-08-10 DIAGNOSIS — Z833 Family history of diabetes mellitus: Secondary | ICD-10-CM

## 2019-08-10 DIAGNOSIS — I110 Hypertensive heart disease with heart failure: Secondary | ICD-10-CM | POA: Diagnosis present

## 2019-08-10 DIAGNOSIS — J4489 Other specified chronic obstructive pulmonary disease: Secondary | ICD-10-CM | POA: Diagnosis present

## 2019-08-10 DIAGNOSIS — J449 Chronic obstructive pulmonary disease, unspecified: Secondary | ICD-10-CM | POA: Diagnosis present

## 2019-08-10 DIAGNOSIS — E119 Type 2 diabetes mellitus without complications: Secondary | ICD-10-CM

## 2019-08-10 DIAGNOSIS — Z8541 Personal history of malignant neoplasm of cervix uteri: Secondary | ICD-10-CM

## 2019-08-10 DIAGNOSIS — I4510 Unspecified right bundle-branch block: Secondary | ICD-10-CM | POA: Diagnosis present

## 2019-08-10 DIAGNOSIS — Z66 Do not resuscitate: Secondary | ICD-10-CM | POA: Diagnosis present

## 2019-08-10 DIAGNOSIS — Z79899 Other long term (current) drug therapy: Secondary | ICD-10-CM

## 2019-08-10 DIAGNOSIS — I10 Essential (primary) hypertension: Secondary | ICD-10-CM | POA: Diagnosis present

## 2019-08-10 DIAGNOSIS — I130 Hypertensive heart and chronic kidney disease with heart failure and stage 1 through stage 4 chronic kidney disease, or unspecified chronic kidney disease: Principal | ICD-10-CM | POA: Diagnosis present

## 2019-08-10 DIAGNOSIS — R0600 Dyspnea, unspecified: Secondary | ICD-10-CM | POA: Diagnosis present

## 2019-08-10 DIAGNOSIS — J9621 Acute and chronic respiratory failure with hypoxia: Secondary | ICD-10-CM | POA: Diagnosis present

## 2019-08-10 DIAGNOSIS — E039 Hypothyroidism, unspecified: Secondary | ICD-10-CM | POA: Diagnosis present

## 2019-08-10 DIAGNOSIS — N183 Chronic kidney disease, stage 3 unspecified: Secondary | ICD-10-CM | POA: Diagnosis present

## 2019-08-10 DIAGNOSIS — R0602 Shortness of breath: Secondary | ICD-10-CM

## 2019-08-10 DIAGNOSIS — E785 Hyperlipidemia, unspecified: Secondary | ICD-10-CM | POA: Diagnosis present

## 2019-08-10 DIAGNOSIS — Z806 Family history of leukemia: Secondary | ICD-10-CM

## 2019-08-10 DIAGNOSIS — K219 Gastro-esophageal reflux disease without esophagitis: Secondary | ICD-10-CM | POA: Diagnosis present

## 2019-08-10 DIAGNOSIS — I5033 Acute on chronic diastolic (congestive) heart failure: Secondary | ICD-10-CM | POA: Diagnosis present

## 2019-08-10 LAB — RESPIRATORY PANEL BY RT PCR (FLU A&B, COVID)
Influenza A by PCR: NEGATIVE
Influenza B by PCR: NEGATIVE
SARS Coronavirus 2 by RT PCR: NEGATIVE

## 2019-08-10 LAB — HEPATIC FUNCTION PANEL
ALT: 26 U/L (ref 0–44)
ALT: 25 U/L (ref 0–44)
AST: 30 U/L (ref 15–41)
AST: 30 U/L (ref 15–41)
Albumin: 3.2 g/dL — ABNORMAL LOW (ref 3.5–5.0)
Albumin: 3.2 g/dL — ABNORMAL LOW (ref 3.5–5.0)
Alkaline Phosphatase: 73 U/L (ref 38–126)
Alkaline Phosphatase: 72 U/L (ref 38–126)
Bilirubin, Direct: <0.1 mg/dL (ref 0.0–0.2)
Bilirubin, Direct: <0.1 mg/dL (ref 0.0–0.2)
Total Bilirubin: 0.9 mg/dL (ref 0.3–1.2)
Total Bilirubin: 0.6 mg/dL (ref 0.3–1.2)
Total Protein: 6.6 g/dL (ref 6.5–8.1)
Total Protein: 6.4 g/dL — ABNORMAL LOW (ref 6.5–8.1)

## 2019-08-10 LAB — CBC
HCT: 31.6 % — ABNORMAL LOW (ref 36.0–46.0)
Hemoglobin: 9.5 g/dL — ABNORMAL LOW (ref 12.0–15.0)
MCH: 30.6 pg (ref 26.0–34.0)
MCHC: 30.1 g/dL (ref 30.0–36.0)
MCV: 101.9 fL — ABNORMAL HIGH (ref 80.0–100.0)
Platelets: 168 10*3/uL (ref 150–400)
RBC: 3.1 MIL/uL — ABNORMAL LOW (ref 3.87–5.11)
RDW: 14.6 % (ref 11.5–15.5)
WBC: 9.9 10*3/uL (ref 4.0–10.5)
nRBC: 0 % (ref 0.0–0.2)

## 2019-08-10 LAB — BASIC METABOLIC PANEL
Anion gap: 12 (ref 5–15)
BUN: 25 mg/dL — ABNORMAL HIGH (ref 8–23)
CO2: 33 mmol/L — ABNORMAL HIGH (ref 22–32)
Calcium: 9.1 mg/dL (ref 8.9–10.3)
Chloride: 95 mmol/L — ABNORMAL LOW (ref 98–111)
Creatinine, Ser: 1.39 mg/dL — ABNORMAL HIGH (ref 0.44–1.00)
GFR calc Af Amer: 39 mL/min — ABNORMAL LOW (ref >60)
GFR calc non Af Amer: 34 mL/min — ABNORMAL LOW (ref >60)
Glucose, Bld: 185 mg/dL — ABNORMAL HIGH (ref 70–99)
Potassium: 4.9 mmol/L (ref 3.5–5.1)
Sodium: 140 mmol/L (ref 135–145)

## 2019-08-10 LAB — TROPONIN I (HIGH SENSITIVITY)
Troponin I (High Sensitivity): 21 ng/L — ABNORMAL HIGH (ref <18)
Troponin I (High Sensitivity): 27 ng/L — ABNORMAL HIGH (ref <18)

## 2019-08-10 LAB — BRAIN NATRIURETIC PEPTIDE: B Natriuretic Peptide: 180 pg/mL — ABNORMAL HIGH (ref 0.0–100.0)

## 2019-08-10 MED ORDER — IPRATROPIUM-ALBUTEROL 0.5-2.5 (3) MG/3ML IN SOLN
3.0000 mL | Freq: Once | RESPIRATORY_TRACT | Status: AC
  Administered 2019-08-10: 18:00:00 3 mL via RESPIRATORY_TRACT
  Filled 2019-08-10: qty 3

## 2019-08-10 MED ORDER — ACETAMINOPHEN 325 MG PO TABS: 650.0000 mg | ORAL_TABLET | Freq: Four times a day (QID) | ORAL | Status: DC | PRN

## 2019-08-10 MED ORDER — SODIUM CHLORIDE 0.9% FLUSH: 3.0000 mL | Freq: Once | INTRAVENOUS | Status: DC

## 2019-08-10 MED ORDER — IOHEXOL 350 MG/ML SOLN
60.0000 mL | Freq: Once | INTRAVENOUS | Status: AC | PRN
  Administered 2019-08-10: 60 mL via INTRAVENOUS

## 2019-08-10 MED ORDER — ONDANSETRON HCL 4 MG/2ML IJ SOLN: 4.0000 mg | Freq: Four times a day (QID) | INTRAMUSCULAR | Status: DC | PRN

## 2019-08-10 MED ORDER — ACETAMINOPHEN 650 MG RE SUPP: 650.0000 mg | Freq: Four times a day (QID) | RECTAL | Status: DC | PRN

## 2019-08-10 MED ORDER — ONDANSETRON HCL 4 MG PO TABS: 4.0000 mg | ORAL_TABLET | Freq: Four times a day (QID) | ORAL | Status: DC | PRN

## 2019-08-10 NOTE — ED Provider Notes (Signed)
Care assumed after prior ED provider.  CT did not demonstrate evidence of PE.  Dr. Olevia Bowens of the hospitalist service will evaluate the patient for admission.   Valarie Merino, MD 08/10/19 2258

## 2019-08-10 NOTE — ED Notes (Signed)
Patient ambulated to the bathroom w/ x1 assist. Patient remained on supplemental 02 at 2L via [place]; noted Sp02 decreased to 89%; patient endorsed feeling short of breath while walking.

## 2019-08-10 NOTE — ED Provider Notes (Signed)
[facility] EMERGENCY DEPARTMENT Provider Note   CSN: [id number] Arrival date & time: 08/10/19  1201     History Chief Complaint  Patient presents with  . Shortness of Breath    Sandra Cuevas is a 86 y.o. female.  HPI    86 year old female with history of COPD comes in a chief complaint of shortness of breath.  Patient reports that she went on 2 L of oxygen back in December after having rib injury.  Over the past few days she has started having exertional shortness of breath.  There is no associated chest pain.  Patient gets winded with minimal exertion and her symptoms improve at rest.  Patient is also noted that her O2 sats have started dropping to 70s when she is exerting herself. She was just admitted to any pain hospital and started on Lasix with the suspicion that she has both COPD and CHF exacerbation.  Family reports that patient has not improved significantly after being discharged from [facility].  Upon walking, they have noted her O2 sats dropped to 80s.  Pt has no hx of PE, DVT and denies any exogenous hormone (testosterone / estrogen) use, long distance travels or surgery in the past 6 weeks, active cancer, recent immobilization.   Past Medical History:  Diagnosis Date  . Arthritis   . Asthma   . Atypical chest pain   . 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 COPD (chronic obstructive pulmonary disease) ([place])   . Diabetes mellitus   . GERD (gastroesophageal reflux disease)   . History of cardiovascular stress test 06/01/2010   EF 71% - no evidence of ischemia, normal left ventricular systolic function  . History of hysterectomy 1965  . Hyperlipidemia   . Hypertension   . Hypothyroidism   . Shortness of breath    on exertion  . Tubular adenoma of colon     Patient Active Problem List   Diagnosis Date Noted  . Right rib fracture 03/21/2019  . Acute respiratory failure ([place]) 03/20/2019  . Fever 10/08/2018  . Chronic rhinitis  11/17/2017  . Pulmonary nodule, right 03/10/2017  . GERD (gastroesophageal reflux disease) 10/14/2016  . 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 Hypertensive urgency 07/14/2016  . Chest pain 07/14/2016  . Diabetes mellitus, type II ([place]) 07/14/2016  . CKD (chronic kidney disease), stage II 07/14/2016  . Cellulitis 07/12/2016  . Cellulitis of right leg 07/12/2016  . Chronic diastolic CHF (congestive heart failure) ([place]) 04/30/2016  . Hoarse voice quality 09/05/2015  . Chronic obstructive airway disease with asthma ([place]) 04/20/2013  . Snoring 04/20/2013  . Premature ventricular contractions 03/23/2013  . Acute diastolic heart failure ([place]) 02/18/2013  . Change in stool 01/06/2012  . Shortness of breath 09/26/2011  . Carpal tunnel syndrome 11/06/2010  . Hyperlipidemia   . Atypical chest pain   . Hypothyroidism   . Diabetes mellitus   . EXTERNAL HEMORRHOIDS WITHOUT MENTION COMP 02/12/2010  . RECTAL BLEEDING 02/12/2010  . CERVICAL CANCER 10/18/2009  . Dyslipidemia 10/18/2009  . Benign essential HTN 10/18/2009  . Cough 10/18/2009    Past Surgical History:  Procedure Laterality Date  . ABDOMINAL HYSTERECTOMY    . CARDIAC SURGERY     catherization  . HEMORRHOID SURGERY    . TOTAL SHOULDER REPLACEMENT Right      OB History   No obstetric history on file.     Family History  Problem Relation Age  of Onset  . Heart disease Father        heart problems  . Kidney failure Mother   . Diabetes Mother   . Leukemia Brother   . Diabetes Brother   . Colon cancer Sister   . Leukemia Brother   . Diabetes Brother   . Pancreatic cancer Brother   . Bone cancer Sister     Social History   Tobacco Use  . Smoking status: Never Smoker  . Smokeless tobacco: Never Used  Substance Use Topics  . Alcohol use: No  . Drug use: No    Home Medications Prior to Admission medications   Medication Sig Start Date End Date Taking? Authorizing Provider  acetaminophen (TYLENOL) 500  MG tablet Take 2 tablets (1,000 mg total) by mouth every 6 (six) hours as needed. 07/05/16  Yes Charlesetta Shanks, MD  albuterol (PROVENTIL) (5 MG/ML) 0.5% nebulizer solution Take 0.5 mLs (2.5 mg total) by nebulization every 4 (four) hours as needed for wheezing or shortness of breath. 02/25/13  Yes Ghimire, Henreitta Leber, MD  amLODipine (NORVASC) 10 MG tablet Take 1 tablet (10 mg total) by mouth daily. 03/24/19  Yes Emokpae, Courage, MD  atorvastatin (LIPITOR) 40 MG tablet TAKE 1 TABLET(40 MG) BY MOUTH DAILY AT 6 PM Patient taking differently: Take 40 mg by mouth daily.  07/27/19  Yes Nahser, Wonda Cheng, MD  fluticasone (FLONASE) 50 MCG/ACT nasal spray SHAKE LIQUID AND USE 2 SPRAYS IN EACH NOSTRIL DAILY Patient taking differently: SHAKE LIQUID AND USE 2 SPRAYS IN EACH NOSTRIL DAILY 12/07/18  Yes Byrum, Rose Fillers, MD  furosemide (LASIX) 20 MG tablet Take 1 tablet (20 mg total) by mouth every other day for 10 days. 08/10/19 08/20/19 Yes Johnson, Clanford L, MD  gabapentin (NEURONTIN) 600 MG tablet Take 600 mg by mouth 3 (three) times daily. 08/05/19  Yes [provider]  guaiFENesin (MUCINEX) 600 MG 12 hr tablet Take 1 tablet (600 mg total) by mouth 2 (two) times daily as needed for up to 5 days for to loosen phlegm. 08/09/19 08/14/19 Yes Johnson, Clanford L, MD  levothyroxine (SYNTHROID, LEVOTHROID) 50 MCG tablet Take 50 mcg by mouth daily.     Yes [provider]  Multiple Vitamins-Minerals (PRESERVISION AREDS 2+MULTI VIT PO) Take 1 tablet by mouth daily.   Yes [provider]  Polyvinyl Alcohol-Povidone (MURINE TEARS FOR DRY EYES) 5-6 MG/ML SOLN Place 1 drop into both eyes daily as needed (Dry Eyes).   Yes [provider]  potassium chloride (MICRO-K) 10 MEQ CR capsule Take 10 mEq by mouth daily.  08/15/10  Yes [provider]  traMADol (ULTRAM) 50 MG tablet Take 50 mg by mouth every 4 (four) hours as needed for pain. 08/05/19  Yes [provider]  zolpidem (AMBIEN) 10  MG tablet Take 5 mg by mouth at bedtime as needed for sleep.  08/05/19  Yes [provider]  hydrALAZINE (APRESOLINE) 50 MG tablet Take 1 tablet (50 mg total) by mouth 3 (three) times daily. Patient not taking: Reported on 08/10/2019 03/12/19   Daune Perch, NP  sitaGLIPtin (JANUVIA) 50 MG tablet Take 1 tablet (50 mg total) by mouth daily. Patient not taking: Reported on 08/10/2019 08/09/19   Murlean Iba, MD    Allergies    Patient has no known allergies.  Review of Systems   Review of Systems  Constitutional: Positive for activity change.  Respiratory: Positive for shortness of breath.   Cardiovascular: Negative for chest pain.  Gastrointestinal:  Negative for nausea and vomiting.  Allergic/Immunologic: Negative for immunocompromised state.  Hematological: Does not bruise/bleed easily.  All other systems reviewed and are negative.   Physical Exam Updated Vital Signs BP (!) 162/66   Pulse 81   Temp 98.5 F (36.9 C) (Oral)   Resp 17   Ht 5\' 6"  (1.676 m)   Wt 93 kg   SpO2 94%   BMI 33.09 kg/m   Physical Exam Vitals and nursing note reviewed.  Constitutional:      Appearance: She is well-developed.  HENT:     Head: Normocephalic and atraumatic.  Neck:     Vascular: JVD present.  Cardiovascular:     Rate and Rhythm: Normal rate.  Pulmonary:     Effort: Pulmonary effort is normal.     Breath sounds: No wheezing, rhonchi or rales.  Abdominal:     General: Bowel sounds are normal.  Musculoskeletal:     Cervical back: Normal range of motion and neck supple.     Right lower leg: No edema.     Left lower leg: No edema.  Skin:    General: Skin is warm and dry.  Neurological:     Mental Status: She is alert and oriented to person, place, and time.     ED Results / Procedures / Treatments   Labs (all labs ordered are listed, but only abnormal results are displayed) Labs Reviewed  BASIC METABOLIC PANEL - Abnormal; Notable for the following components:       Result Value   Chloride 95 (*)    CO2 33 (*)    Glucose, Bld 185 (*)    BUN 25 (*)    Creatinine, Ser 1.39 (*)    GFR calc non Af Amer 34 (*)    GFR calc Af Amer 39 (*)    All other components within normal limits  CBC - Abnormal; Notable for the following components:   RBC 3.10 (*)    Hemoglobin 9.5 (*)    HCT 31.6 (*)    MCV 101.9 (*)    All other components within normal limits  BRAIN NATRIURETIC PEPTIDE - Abnormal; Notable for the following components:   B Natriuretic Peptide 180.0 (*)    All other components within normal limits  HEPATIC FUNCTION PANEL - Abnormal; Notable for the following components:   Albumin 3.2 (*)    All other components within normal limits  TROPONIN I (HIGH SENSITIVITY) - Abnormal; Notable for the following components:   Troponin I (High Sensitivity) 21 (*)    All other components within normal limits  TROPONIN I (HIGH SENSITIVITY) - Abnormal; Notable for the following components:   Troponin I (High Sensitivity) 27 (*)    All other components within normal limits  RESPIRATORY PANEL BY RT PCR (FLU A&B, COVID)  HEPATIC FUNCTION PANEL    EKG EKG Interpretation  Date/Time:  Tuesday Aug 10 2019 12:05:07 EDT Ventricular Rate:  84 PR Interval:  148 QRS Duration: 146 QT Interval:  430 QTC Calculation: 508 R Axis:   106 Text Interpretation: Normal sinus rhythm Right bundle branch block Abnormal ECG No acute changes Nonspecific ST and T wave abnormality Confirmed by Varney Biles [id number]) on 08/10/2019 1:36:12 PM   Radiology DG Chest 2 View  Result Date: 08/10/2019 CLINICAL DATA:  Shortness of breath. EXAM: CHEST - 2 VIEW COMPARISON:  08/08/2019 chest x-ray. FINDINGS: The cardiac silhouette, mediastinal and hilar contours are within normal limits and stable. Moderate tortuosity and calcification of the thoracic  aorta. Chronic underlying bronchitic interstitial lung changes and areas of pulmonary scarring. Persistent small right pleural effusion with  overlying atelectasis. No focal infiltrates or overt pulmonary edema. No pneumothorax. IMPRESSION: Persistent small right pleural effusion with overlying atelectasis. No infiltrates or pulmonary edema. Electronically Signed   By: Marijo Sanes M.D.   On: 08/10/2019 12:38    Procedures Procedures (including critical care time)  Medications Ordered in ED Medications  sodium chloride flush (NS) 0.9 % injection 3 mL (3 mLs Intravenous Not Given 08/10/19 1418)  ipratropium-albuterol (DUONEB) 0.5-2.5 (3) MG/3ML nebulizer solution 3 mL (has no administration in time range)    ED Course  I have reviewed the triage vital signs and the nursing notes.  Pertinent labs & imaging results that were available during my care of the patient were reviewed by me and considered in my medical decision making (see chart for details).    MDM Rules/Calculators/A&P                     Ambermarie C Girgenti was evaluated in Emergency Department on 08/10/2019 for the symptoms described in the history of present illness. She was evaluated in the context of the global COVID-19 pandemic, which necessitated consideration that the patient might be at risk for infection with the SARS-CoV-2 virus that causes COVID-19. Institutional protocols and algorithms that pertain to the evaluation of patients at risk for COVID-19 are in a state of rapid change based on information released by regulatory bodies including the CDC and federal and state organizations. These policies and algorithms were followed during the patient's care in the ED.   86 year old female comes in a chief complaint of worsening shortness of breath. She is having hypoxia with ambulation despite using a 2 L of oxygen.  She has history of COPD.  She was admitted to any pain hospital recently and there is thought that maybe she has new onset CHF.  Patient was started on Lasix and advised to follow-up with cardiology.  Family is concerned that patient has not recovered from  the admission.  On exam she is noted to have JVD, bibasilar rales with diminished breath sounds.  It appears that she likely has combined COPD and CHF, as admission team determined.  Upon ambulation here patient's O2 sats drops to the 80s. CT PE ordered to ensure there is no underlying primary pulmonary issue.  She will likely need admission to the hospital for further optimization, especially from exertion standpoint of view.  Final Clinical Impression(s) / ED Diagnoses Final diagnoses:  None    Rx / DC Orders ED Discharge Orders    None       Varney Biles, MD 08/10/19 1659

## 2019-08-10 NOTE — ED Notes (Signed)
Called lab to add hepatic function and BNP to current sample,

## 2019-08-10 NOTE — H&P (Signed)
History and Physical    JAYDALYNN LALLEY [id number] DOB: 1929/01/17 DOA: 08/10/2019  PCP: Sharilyn Sites, MD  Patient coming from: Home.  I have personally briefly reviewed patient's old medical records in [place]  Chief Complaint: Shortness of breath.  HPI: Sandra Cuevas is a 86 y.o. 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  She states that once she has been home she has continued to have dyspnea while trying to do her daily activities.  She denies fever, chills, sore throat, hemoptysis, but complains of wheezing and dyspnea.  No chest pain, palpitations, dizziness, diaphoresis, PND, orthopnea and currently no pitting edema lower extremities.  She denies abdominal pain, nausea, vomiting, diarrhea, constipation, melena or hematochezia.  She complains of frequency with diuretic, but denies dysuria or hematuria.  No polyuria, polydipsia, polyphagia or blurred vision.  ED Course: Initial vital signs were temperature 98.5 F, pulse 85, respirations 17, blood pressure 152/72 and O2 sat 97% on 2 LPM via nasal cannula.  White count is 9.9, hemoglobin 9.5 g/dL and platelets 168.  Sodium 140, potassium 4.9, chloride 95 CO2 33 mmol/L.  Anion gap was normal.  Glucose 185, BUN 25 and creatinine 1.39 mg/dL.  Troponin was 21 and then 27 ng/L.  BNP was 180.0 pg/mL.  LFTs showed an albumin of 3.2 g/dL, but all other values are otherwise within expected range.  SARS coronavirus 2 was negative.  Imaging: Chest radiograph show persistent small right pleural effusion with overlying atelectasis.  No infiltrates or pulmonary edema.  CTA chest did not show evidence of  PE.  There was small bilateral pleural effusions, right greater than left.  Interlobular septal thickening and groundglass airspace disease likely due to mild edema.  There was nonspecific mediastinal adenopathy, which is new since prior study.  Aortic aneurysm NOS.  Please see images and full regular report for further detail.  Review of Systems: As per HPI otherwise all other systems reviewed and are negative.  Past Medical History:  Diagnosis Date  . Arthritis   . Asthma   . Atypical chest pain   . 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 COPD (chronic obstructive pulmonary disease) ([place])   . Diabetes mellitus   . GERD (gastroesophageal reflux disease)   . History of cardiovascular stress test 06/01/2010   EF 71% - no evidence of ischemia, normal left ventricular systolic function  . History of hysterectomy 1965  . Hyperlipidemia   . Hypertension   . Hypothyroidism   . Shortness of breath    on exertion  . Tubular adenoma of colon     Past Surgical History:  Procedure Laterality Date  . ABDOMINAL HYSTERECTOMY    . CARDIAC SURGERY     catherization  . HEMORRHOID SURGERY    . TOTAL SHOULDER REPLACEMENT Right     Social History  reports that she has never smoked. She has never used smokeless tobacco. She reports that she does not drink alcohol or use drugs.  No Known Allergies  Family History  Problem Relation Age of Onset  . Heart disease Father        heart problems  . Kidney failure Mother   .  Diabetes Mother   . Leukemia Brother   . Diabetes Brother   . Colon cancer Sister   . Leukemia Brother   . Diabetes Brother   . Pancreatic cancer Brother   . Bone cancer Sister    Prior to Admission medications   Medication Sig Start Date End Date Taking? Authorizing Provider  acetaminophen (TYLENOL) 500 MG tablet Take 2 tablets (1,000 mg total) by mouth every 6 (six) hours as needed. 07/05/16  Yes Charlesetta Shanks, MD  albuterol (PROVENTIL) (5 MG/ML) 0.5%  nebulizer solution Take 0.5 mLs (2.5 mg total) by nebulization every 4 (four) hours as needed for wheezing or shortness of breath. 02/25/13  Yes Ghimire, Henreitta Leber, MD  amLODipine (NORVASC) 10 MG tablet Take 1 tablet (10 mg total) by mouth daily. 03/24/19  Yes Emokpae, Courage, MD  atorvastatin (LIPITOR) 40 MG tablet TAKE 1 TABLET(40 MG) BY MOUTH DAILY AT 6 PM Patient taking differently: Take 40 mg by mouth daily.  07/27/19  Yes Nahser, Wonda Cheng, MD  fluticasone (FLONASE) 50 MCG/ACT nasal spray SHAKE LIQUID AND USE 2 SPRAYS IN EACH NOSTRIL DAILY Patient taking differently: SHAKE LIQUID AND USE 2 SPRAYS IN EACH NOSTRIL DAILY 12/07/18  Yes Byrum, Rose Fillers, MD  furosemide (LASIX) 20 MG tablet Take 1 tablet (20 mg total) by mouth every other day for 10 days. 08/10/19 08/20/19 Yes Johnson, Clanford L, MD  gabapentin (NEURONTIN) 600 MG tablet Take 600 mg by mouth 3 (three) times daily. 08/05/19  Yes [provider]  guaiFENesin (MUCINEX) 600 MG 12 hr tablet Take 1 tablet (600 mg total) by mouth 2 (two) times daily as needed for up to 5 days for to loosen phlegm. 08/09/19 08/14/19 Yes Johnson, Clanford L, MD  levothyroxine (SYNTHROID, LEVOTHROID) 50 MCG tablet Take 50 mcg by mouth daily.     Yes [provider]  Multiple Vitamins-Minerals (PRESERVISION AREDS 2+MULTI VIT PO) Take 1 tablet by mouth daily.   Yes [provider]  Polyvinyl Alcohol-Povidone (MURINE TEARS FOR DRY EYES) 5-6 MG/ML SOLN Place 1 drop into both eyes daily as needed (Dry Eyes).   Yes [provider]  potassium chloride (MICRO-K) 10 MEQ CR capsule Take 10 mEq by mouth daily.  08/15/10  Yes [provider]  traMADol (ULTRAM) 50 MG tablet Take 50 mg by mouth every 4 (four) hours as needed for pain. 08/05/19  Yes [provider]  zolpidem (AMBIEN) 10 MG tablet Take 5 mg by mouth at bedtime as needed for sleep.  08/05/19  Yes [provider]  hydrALAZINE (APRESOLINE) 50 MG tablet Take 1 tablet  (50 mg total) by mouth 3 (three) times daily. Patient not taking: Reported on 08/10/2019 03/12/19   Daune Perch, NP  sitaGLIPtin (JANUVIA) 50 MG tablet Take 1 tablet (50 mg total) by mouth daily. Patient not taking: Reported on 08/10/2019 08/09/19   Murlean Iba, MD    Physical Exam: Vitals:   08/10/19 2130 08/10/19 2145 08/10/19 2200 08/10/19 2215  BP:      Pulse: 85 85 85 81  Resp: 15 18 19 18   Temp:      TempSrc:      SpO2: 95% 94% 95% 95%  Weight:      Height:        Constitutional: NAD, calm, comfortable Eyes: PERRL, lids and conjunctivae normal.  No icterus. ENMT: Nasal cannula in place.  Mucous membranes are moist. Posterior pharynx clear of any exudate or lesions. Neck: normal, supple, no masses, no thyromegaly  Respiratory: Decreased breath sounds on bases with bibasilar crackles.  No wheezing.  Normal respiratory effort. No accessory muscle use.  Cardiovascular: Regular rate and rhythm, no murmurs / rubs / gallops. No extremity edema. 2+ pedal pulses. No carotid bruits.  Abdomen: Obese, nondistended. Bowel sounds positive.  Soft, no tenderness, no masses palpated. No hepatosplenomegaly.  Musculoskeletal: no clubbing / cyanosis. Good ROM, no contractures. Normal muscle tone.  Skin: no clinically significant rashes, lesions, ulcers on very limited dermatological examination. Neurologic: CN 2-12 grossly intact. Sensation intact, DTR normal. Strength 5/5 in all 4.  Psychiatric: Normal judgment and insight. Alert and oriented x 3. Normal mood.   Labs on Admission: I have personally reviewed following labs and imaging studies  CBC: Recent Labs  Lab 08/08/19 1559 08/09/19 0508 08/10/19 1230  WBC 7.9 5.6 9.9  NEUTROABS 5.7  --   --   HGB 9.2* 8.6* 9.5*  HCT 30.7* 29.0* 31.6*  MCV 103.4* 101.0* 101.9*  PLT 172 176 [id number]    Basic Metabolic Panel: Recent Labs  Lab 08/08/19 1559 08/09/19 0508 08/10/19 1230  NA 137 140 140  K 4.4 4.4 4.9  CL 99 99 95*  CO2 33*  33* 33*  GLUCOSE 124* 215* 185*  BUN 18 23 25*  CREATININE 0.85 1.03* 1.39*  CALCIUM 8.9 8.9 9.1    GFR: Estimated Creatinine Clearance: 31.5 mL/min (A) (by C-G formula based on SCr of 1.39 mg/dL (H)).  Liver Function Tests: Recent Labs  Lab 08/09/19 0508 08/10/19 1418 08/10/19 1435  AST 23 30 30   ALT 20 26 25   ALKPHOS 76 73 72  BILITOT 0.8 0.9 0.6  PROT 5.9* 6.6 6.4*  ALBUMIN 2.9* 3.2* 3.2*    Urine analysis:    Component Value Date/Time   COLORURINE YELLOW 03/21/2019 0401   APPEARANCEUR CLEAR 03/21/2019 0401   LABSPEC 1.018 03/21/2019 0401   PHURINE 6.0 03/21/2019 0401   GLUCOSEU NEGATIVE 03/21/2019 0401   HGBUR NEGATIVE 03/21/2019 0401   BILIRUBINUR NEGATIVE 03/21/2019 0401   KETONESUR NEGATIVE 03/21/2019 0401   PROTEINUR NEGATIVE 03/21/2019 0401   UROBILINOGEN 0.2 12/28/2009 0622   NITRITE NEGATIVE 03/21/2019 0401   LEUKOCYTESUR TRACE (A) 03/21/2019 0401    Radiological Exams on Admission: DG Chest 2 View  Result Date: 08/10/2019 CLINICAL DATA:  Shortness of breath. EXAM: CHEST - 2 VIEW COMPARISON:  08/08/2019 chest x-ray. FINDINGS: The cardiac silhouette, mediastinal and hilar contours are within normal limits and stable. Moderate tortuosity and calcification of the thoracic aorta. Chronic underlying bronchitic interstitial lung changes and areas of pulmonary scarring. Persistent small right pleural effusion with overlying atelectasis. No focal infiltrates or overt pulmonary edema. No pneumothorax. IMPRESSION: Persistent small right pleural effusion with overlying atelectasis. No infiltrates or pulmonary edema. Electronically Signed   By: Marijo Sanes M.D.   On: 08/10/2019 12:38   CT Angio Chest PE W and/or Wo Contrast  Result Date: 08/10/2019 CLINICAL DATA:  Shortness of breath, hypoxia EXAM: CT ANGIOGRAPHY CHEST WITH CONTRAST TECHNIQUE: Multidetector CT imaging of the chest was performed using the standard protocol during bolus administration of intravenous  contrast. Multiplanar CT image reconstructions and MIPs were obtained to evaluate the vascular anatomy. CONTRAST:  29mL OMNIPAQUE IOHEXOL 350 MG/ML SOLN COMPARISON:  08/10/2019, 03/20/2019 FINDINGS: Cardiovascular: This is a technically adequate evaluation of the pulmonary vasculature. No filling defects or pulmonary emboli. The heart is not enlarged. There is dense calcification of the mitral annulus and aortic valve. There is significant atherosclerosis of the aortic arch. No evidence  of thoracic aortic aneurysm. Mediastinum/Nodes: Mediastinal adenopathy measures up to 15 mm in the precarinal region, nonspecific. This is new since prior study. Thyroid, trachea, and esophagus are unremarkable. Lungs/Pleura: There are small bilateral pleural effusions, right greater than left. Dependent consolidation is seen within the bilateral lower lobes, right greater than left. Mild diffuse interlobular septal thickening with scattered ground-glass opacities most consistent with mild edema. No pneumothorax. Central airways are patent. Upper Abdomen: No acute abnormality. Musculoskeletal: No acute or destructive bony lesions. Right shoulder arthroplasty partially visualized. Reconstructed images demonstrate no additional findings. Review of the MIP images confirms the above findings. IMPRESSION: 1. No evidence of pulmonary embolus. 2. Small bilateral pleural effusions, right greater than left. 3. Interlobular septal thickening and ground-glass airspace disease most consistent with mild edema. 4. Nonspecific mediastinal adenopathy, new since prior study. 5.  Aortic aneurysm NOS (ICD10-I71.9). Electronically Signed   By: Randa Ngo M.D.   On: 08/10/2019 19:43    EKG: Independently reviewed. Vent. rate 84 BPM PR interval 148 ms QRS duration 146 ms QT/QTc 430/508 ms P-R-T axes 73 106 -2 Normal sinus rhythm Right bundle branch block Abnormal ECG  Assessment/Plan Principal Problem:   Dyspnea History of COPD and  diastolic CHF. She seems to be physically deconditioned. Observation/telemetry. Continue supplemental oxygen. Decline furosemide due to nocturia. Will give furosemide 20 mg IVP later this morning. Then increase furosemide to 20 mg p.o. daily from every other day. Monitor daily weights, intake and output. Monitor renal function electrolytes. Check echocardiogram.  Active Problems:   Chronic obstructive airway disease with asthma (HCC)  Continue supplemental oxygen. Continue bronchodilators.    CKD (chronic kidney disease) stage 3, GFR 30-59 ml/min Monitor renal function electrolytes closely.    Dyslipidemia Continue atorvastatin 40 mg p.o. daily.    Benign essential HTN Continue amlodipine 10 mg p.o. daily. Monitor blood pressure.    Hypothyroidism Continue Synthroid 50 mcg p.o. daily.    Diabetes mellitus, type II ([place]) Carbohydrate modified diet. CBG monitoring before meals and at bedtime. Check hemoglobin A1c.    DVT prophylaxis: SCDs. Code Status:   Full code.  Family Communication: Disposition Plan:   Patient is from:  Home.  Anticipated DC to:  Home.  Anticipated DC date:  08/12/2019.  Anticipated DC barriers: Clinical improvement.  Consults called:   Admission status:  Observation/Telemetry.   Severity of Illness: Moderate severity.  Reubin Milan MD [facility]  How to contact the [facility] Attending or Consulting provider [place] or covering provider during after hours [place], for this patient?   1. Check the care team in [facility] and look for a) attending/consulting TRH provider listed and b) the [facility] team listed 2. Log into [email or web address] and use [place]'s universal password to access. If you do not have the password, please contact the hospital operator. 3. Locate the [facility] provider you are looking for under [facility] and page to a number that you can be directly reached. 4. If you still have difficulty reaching the provider, please page  the [facility] (Director on Call) for the Hospitalists listed on amion for assistance.  08/10/2019, 11:20 PM   This document was prepared using Dragon voice recognition software and may contain some unintended transcription errors

## 2019-08-10 NOTE — ED Triage Notes (Signed)
Pt arrives pov from home where she was just discharged from [facility] for sob and low sats. Pt states she has been checking her oxygen when she gets up and is dropping in the 80s'. Pt denies any sob or cp while sitting in wheelchair on her on 2L [place] that she wears all the time.

## 2019-08-11 ENCOUNTER — Other Ambulatory Visit: Payer: Self-pay

## 2019-08-11 ENCOUNTER — Observation Stay (HOSPITAL_COMMUNITY): Payer: PPO

## 2019-08-11 ENCOUNTER — Encounter (HOSPITAL_COMMUNITY): Payer: Self-pay | Admitting: Internal Medicine

## 2019-08-11 DIAGNOSIS — I130 Hypertensive heart and chronic kidney disease with heart failure and stage 1 through stage 4 chronic kidney disease, or unspecified chronic kidney disease: Secondary | ICD-10-CM | POA: Diagnosis present

## 2019-08-11 DIAGNOSIS — J9611 Chronic respiratory failure with hypoxia: Secondary | ICD-10-CM | POA: Diagnosis present

## 2019-08-11 DIAGNOSIS — Z800 Family history of malignant neoplasm of digestive organs: Secondary | ICD-10-CM | POA: Diagnosis not present

## 2019-08-11 DIAGNOSIS — J449 Chronic obstructive pulmonary disease, unspecified: Secondary | ICD-10-CM | POA: Diagnosis present

## 2019-08-11 DIAGNOSIS — E785 Hyperlipidemia, unspecified: Secondary | ICD-10-CM | POA: Diagnosis present

## 2019-08-11 DIAGNOSIS — Z833 Family history of diabetes mellitus: Secondary | ICD-10-CM | POA: Diagnosis not present

## 2019-08-11 DIAGNOSIS — I5032 Chronic diastolic (congestive) heart failure: Secondary | ICD-10-CM

## 2019-08-11 DIAGNOSIS — R0600 Dyspnea, unspecified: Secondary | ICD-10-CM

## 2019-08-11 DIAGNOSIS — I4510 Unspecified right bundle-branch block: Secondary | ICD-10-CM | POA: Diagnosis present

## 2019-08-11 DIAGNOSIS — Z66 Do not resuscitate: Secondary | ICD-10-CM | POA: Diagnosis present

## 2019-08-11 DIAGNOSIS — E1122 Type 2 diabetes mellitus with diabetic chronic kidney disease: Secondary | ICD-10-CM | POA: Diagnosis present

## 2019-08-11 DIAGNOSIS — K219 Gastro-esophageal reflux disease without esophagitis: Secondary | ICD-10-CM | POA: Diagnosis present

## 2019-08-11 DIAGNOSIS — Z20822 Contact with and (suspected) exposure to covid-19: Secondary | ICD-10-CM | POA: Diagnosis present

## 2019-08-11 DIAGNOSIS — Z9981 Dependence on supplemental oxygen: Secondary | ICD-10-CM | POA: Diagnosis not present

## 2019-08-11 DIAGNOSIS — Z96611 Presence of right artificial shoulder joint: Secondary | ICD-10-CM | POA: Diagnosis present

## 2019-08-11 DIAGNOSIS — N179 Acute kidney failure, unspecified: Secondary | ICD-10-CM | POA: Diagnosis present

## 2019-08-11 DIAGNOSIS — I5033 Acute on chronic diastolic (congestive) heart failure: Secondary | ICD-10-CM | POA: Diagnosis present

## 2019-08-11 DIAGNOSIS — Z8249 Family history of ischemic heart disease and other diseases of the circulatory system: Secondary | ICD-10-CM | POA: Diagnosis not present

## 2019-08-11 DIAGNOSIS — J441 Chronic obstructive pulmonary disease with (acute) exacerbation: Secondary | ICD-10-CM | POA: Diagnosis present

## 2019-08-11 DIAGNOSIS — E039 Hypothyroidism, unspecified: Secondary | ICD-10-CM | POA: Diagnosis present

## 2019-08-11 DIAGNOSIS — I110 Hypertensive heart disease with heart failure: Secondary | ICD-10-CM | POA: Diagnosis present

## 2019-08-11 DIAGNOSIS — M1990 Unspecified osteoarthritis, unspecified site: Secondary | ICD-10-CM | POA: Diagnosis present

## 2019-08-11 DIAGNOSIS — J90 Pleural effusion, not elsewhere classified: Secondary | ICD-10-CM | POA: Diagnosis present

## 2019-08-11 DIAGNOSIS — R0602 Shortness of breath: Secondary | ICD-10-CM | POA: Diagnosis present

## 2019-08-11 DIAGNOSIS — J9621 Acute and chronic respiratory failure with hypoxia: Secondary | ICD-10-CM | POA: Diagnosis present

## 2019-08-11 DIAGNOSIS — N1832 Chronic kidney disease, stage 3b: Secondary | ICD-10-CM | POA: Diagnosis present

## 2019-08-11 DIAGNOSIS — G894 Chronic pain syndrome: Secondary | ICD-10-CM | POA: Diagnosis present

## 2019-08-11 LAB — BASIC METABOLIC PANEL
Anion gap: 8 (ref 5–15)
BUN: 17 mg/dL (ref 8–23)
CO2: 36 mmol/L — ABNORMAL HIGH (ref 22–32)
Calcium: 8.8 mg/dL — ABNORMAL LOW (ref 8.9–10.3)
Chloride: 97 mmol/L — ABNORMAL LOW (ref 98–111)
Creatinine, Ser: 1 mg/dL (ref 0.44–1.00)
GFR calc Af Amer: 58 mL/min — ABNORMAL LOW (ref >60)
GFR calc non Af Amer: 50 mL/min — ABNORMAL LOW (ref >60)
Glucose, Bld: 121 mg/dL — ABNORMAL HIGH (ref 70–99)
Potassium: 4 mmol/L (ref 3.5–5.1)
Sodium: 141 mmol/L (ref 135–145)

## 2019-08-11 LAB — CBC
HCT: 28.2 % — ABNORMAL LOW (ref 36.0–46.0)
Hemoglobin: 8.8 g/dL — ABNORMAL LOW (ref 12.0–15.0)
MCH: 30.7 pg (ref 26.0–34.0)
MCHC: 31.2 g/dL (ref 30.0–36.0)
MCV: 98.3 fL (ref 80.0–100.0)
Platelets: 176 10*3/uL (ref 150–400)
RBC: 2.87 MIL/uL — ABNORMAL LOW (ref 3.87–5.11)
RDW: 14.4 % (ref 11.5–15.5)
WBC: 8.3 10*3/uL (ref 4.0–10.5)
nRBC: 0 % (ref 0.0–0.2)

## 2019-08-11 LAB — HEMOGLOBIN A1C
Hgb A1c MFr Bld: 5.9 % — ABNORMAL HIGH (ref 4.8–5.6)
Mean Plasma Glucose: 122.63 mg/dL

## 2019-08-11 LAB — ECHOCARDIOGRAM COMPLETE
Height: 66 in
Weight: 3279.74 oz

## 2019-08-11 LAB — GLUCOSE, CAPILLARY: Glucose-Capillary: 179 mg/dL — ABNORMAL HIGH (ref 70–99)

## 2019-08-11 MED ORDER — IPRATROPIUM-ALBUTEROL 0.5-2.5 (3) MG/3ML IN SOLN: 3.0000 mL | Freq: Four times a day (QID) | RESPIRATORY_TRACT | Status: DC | PRN

## 2019-08-11 MED ORDER — TRAMADOL HCL 50 MG PO TABS: 50.0000 mg | ORAL_TABLET | Freq: Two times a day (BID) | ORAL | Status: DC | PRN

## 2019-08-11 MED ORDER — AMLODIPINE BESYLATE 10 MG PO TABS
10.0000 mg | ORAL_TABLET | Freq: Every day | ORAL | Status: DC
  Administered 2019-08-11 – 2019-08-12 (×2): 10 mg via ORAL
  Filled 2019-08-11 (×2): qty 1

## 2019-08-11 MED ORDER — ZOLPIDEM TARTRATE 5 MG PO TABS: 5.0000 mg | ORAL_TABLET | Freq: Every evening | ORAL | Status: DC | PRN

## 2019-08-11 MED ORDER — GUAIFENESIN ER 600 MG PO TB12: 600.0000 mg | ORAL_TABLET | Freq: Two times a day (BID) | ORAL | Status: DC | PRN

## 2019-08-11 MED ORDER — FUROSEMIDE 20 MG PO TABS
20.0000 mg | ORAL_TABLET | Freq: Every day | ORAL | Status: DC
  Administered 2019-08-12: 20 mg via ORAL
  Filled 2019-08-11: qty 1

## 2019-08-11 MED ORDER — POTASSIUM CHLORIDE CRYS ER 10 MEQ PO TBCR
10.0000 meq | EXTENDED_RELEASE_TABLET | Freq: Every day | ORAL | Status: DC
  Administered 2019-08-11 – 2019-08-12 (×2): 10 meq via ORAL
  Filled 2019-08-11 (×2): qty 1

## 2019-08-11 MED ORDER — POLYVINYL ALCOHOL 1.4 % OP SOLN: 1.0000 [drp] | Freq: Every day | OPHTHALMIC | Status: DC | PRN

## 2019-08-11 MED ORDER — LEVOTHYROXINE SODIUM 50 MCG PO TABS
50.0000 ug | ORAL_TABLET | Freq: Every day | ORAL | Status: DC
  Administered 2019-08-11 – 2019-08-12 (×2): 50 ug via ORAL
  Filled 2019-08-11 (×2): qty 1

## 2019-08-11 MED ORDER — ATORVASTATIN CALCIUM 40 MG PO TABS
40.0000 mg | ORAL_TABLET | Freq: Every day | ORAL | Status: DC
  Administered 2019-08-11 – 2019-08-12 (×2): 40 mg via ORAL
  Filled 2019-08-11 (×2): qty 1

## 2019-08-11 MED ORDER — FUROSEMIDE 10 MG/ML IJ SOLN
20.0000 mg | Freq: Once | INTRAMUSCULAR | Status: AC
  Administered 2019-08-11: 20 mg via INTRAVENOUS
  Filled 2019-08-11: qty 2

## 2019-08-11 MED ORDER — FLUTICASONE PROPIONATE 50 MCG/ACT NA SUSP
2.0000 | Freq: Every day | NASAL | Status: DC
  Administered 2019-08-11 – 2019-08-12 (×2): 2 via NASAL
  Filled 2019-08-11: qty 16

## 2019-08-11 MED ORDER — GABAPENTIN 600 MG PO TABS
600.0000 mg | ORAL_TABLET | Freq: Three times a day (TID) | ORAL | Status: DC
  Administered 2019-08-11 – 2019-08-12 (×4): 600 mg via ORAL
  Filled 2019-08-11 (×4): qty 1

## 2019-08-11 NOTE — ED Notes (Signed)
Report given to the 2W RN Larena Glassman, no concerns gotten from the RN.

## 2019-08-11 NOTE — TOC Initial Note (Addendum)
Transition of Care [facility]) - Initial/Assessment Note    Patient Details  Name: Sandra Cuevas MRN: [id number] Date of Birth: 1928/10/24  [facility] CM/SW Contact:    Bethann Berkshire, [place] Phone Number: 08/11/2019, 2:12 PM  Clinical Narrative:                  Met with pt to follow up with PT rec for [facility] PT. Pt is okay with recommendation. States she has had a [place] agency she has had in the past and prefers but doesn't remember; pt is okay with reaching out to other agencies if preferred not available. States her son and daughter help her at home and with transportation. Gives permission to call daughter, Sandra Cuevas, to find out about previous [facility] agency. Pt lives at home alone and has her adult children to help. States she uses a rolling walker and cane at home as well as 02; does not report any other DME needs. CSW calls daughter but she is unaware of the [facility] agency in past. In reviewing chart, [facility]. CS[facility] called Butch Penny at [facility] who could [facility] for [facility].   [id number]: Cassie [facility]/ [facility] could not take [facility] could not take Brittney with [facility] will call back   1610: [place] called back. [facility] can take for PT only.        Patient Goals and CMS Choice        Expected Discharge Plan and Services                                                Prior Living Arrangements/Services                       Activities of Daily Living Home Assistive Devices/Equipment: Oxygen ADL Screening (condition at time of admission) Patient's cognitive ability adequate to safely complete daily activities?: Yes Is the patient deaf or have difficulty hearing?: Yes Does the patient have difficulty seeing, even when wearing glasses/contacts?: No Does the patient have difficulty concentrating, remembering, or making decisions?: No Patient able to express need for assistance with ADLs?: Yes Does the patient have difficulty dressing or  bathing?: No Independently performs ADLs?: Yes (appropriate for developmental age) Does the patient have difficulty walking or climbing stairs?: Yes Weakness of Legs: Both Weakness of Arms/Hands: None  Permission Sought/Granted                  Emotional Assessment              Admission diagnosis:  Dyspnea [R06.00] SOB (shortness of breath) [R06.02] Patient Active Problem List   Diagnosis Date Noted  . Dyspnea 08/10/2019  . CKD (chronic kidney disease) stage 3, GFR 30-59 ml/min 08/10/2019  . Right rib fracture 03/21/2019  . Acute respiratory failure ([place]) 03/20/2019  . Fever 10/08/2018  . Chronic rhinitis 11/17/2017  . Pulmonary nodule, right 03/10/2017  . GERD (gastroesophageal reflux disease) 10/14/2016  . 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 Hypertensive urgency 07/14/2016  . Chest pain 07/14/2016  . Diabetes mellitus, type II ([place]) 07/14/2016  . CKD (chronic kidney disease), stage II 07/14/2016  . Cellulitis 07/12/2016  . Cellulitis of right leg 07/12/2016  . Chronic diastolic CHF (congestive heart failure) ([place]) 04/30/2016  . Hoarse voice quality 09/05/2015  .  Chronic obstructive airway disease with asthma (HCC) 04/20/2013  . Snoring 04/20/2013  . Premature ventricular contractions 03/23/2013  . Acute diastolic heart failure (HCC) 02/18/2013  . Change in stool 01/06/2012  . Shortness of breath 09/26/2011  . Carpal tunnel syndrome 11/06/2010  . Hyperlipidemia   . Atypical chest pain   . Hypothyroidism   . Diabetes mellitus   . EXTERNAL HEMORRHOIDS WITHOUT MENTION COMP 02/12/2010  . RECTAL BLEEDING 02/12/2010  . CERVICAL CANCER 10/18/2009  . Dyslipidemia 10/18/2009  . Benign essential HTN 10/18/2009  . Cough 10/18/2009   PCP:  Golding, John, MD Pharmacy:   [id number] - [place], [place] - [address] AT [address] [place] [address] Phone: [phone] Fax:  [phone]  [facility] - [place], [place] - [address] [place] [address] Phone: [phone] Fax: [phone]     Social Determinants of Health (SDOH) Interventions    Readmission Risk Interventions No flowsheet data found.  

## 2019-08-11 NOTE — [place] Notes (Signed)
  Echocardiogram 2D Echocardiogram has been performed.  Darlina Sicilian M 08/11/2019, 9:03 AM

## 2019-08-11 NOTE — Progress Notes (Signed)
PROGRESS NOTE    QUENISHA PHELAN  [id number] DOB: 1928/07/14 DOA: 08/10/2019 PCP: Sharilyn Sites, MD     Brief Narrative:  Sandra Cuevas is an 86 y.o. female with medical history significant of osteoarthritis, asthma, unspecified remote history of female organ cancer, COPD, type 2 diabetes, GERD, hyperlipidemia, hypertension, hypothyroidism, tubular adenoma of colon who was just discharged from [facility] where she was observed overnight Sunday and discharged yesterday Monday morning who is now coming to the emergency department due to dyspnea.  She states that once she has been home, she has continued to have dyspnea while trying to do her daily activities.    She feels well at rest, but has oxygen desaturation with activities of daily living. Chest radiograph show persistent small right pleural effusion with overlying atelectasis.  No infiltrates or pulmonary edema.  CTA chest did not show evidence of PE.  There was small bilateral pleural effusions, right greater than left.  Interlobular septal thickening and groundglass airspace disease likely due to mild edema.    Denies any peripheral edema.  New events last 24 hours / Subjective: Was just discharged from [facility] on 5/3 and returned to [facility] on 5/4 due to worsening dyspnea at home.  States that she feels well at rest, but gets very short of breath with minimal activity.  Assessment & Plan:   Principal Problem:   Dyspnea Active Problems:   Dyslipidemia   Benign essential HTN   Hypothyroidism   Chronic obstructive airway disease with asthma (HCC)   Diabetes mellitus, type II (HCC)   CKD (chronic kidney disease) stage 3, GFR 30-59 ml/min   Dyspnea with exertion secondary to physical deconditioning, recent hospitalization for COPD exacerbation, acute diastolic heart failure exacerbation, chronic hypoxemic respiratory failure -Requires 2 L nasal cannula at baseline -CTA chest without evidence of PE, R>L pleural  effusion, and interlobular septal thickening and ground-glass airspace disease most consistent with mild edema. -IV Lasix --> p.o. Lasix daily  -Echocardiogram EF 60-65%, grade 1 dd  -Daily weights, strict I's and O's -PT OT  AKI on CKD stage IIIb -Baseline creatinine 0.9-1 -Resolved  Hyperlipidemia -Continue atorvastatin  Essential hypertension -Continue amlodipine  Hypothyroidism -Continue Synthroid  Diabetes mellitus type 2, well controlled -Hemoglobin A1c 5.9    DVT prophylaxis: SCD Code Status: DNR Family Communication: No family at bedside Disposition Plan:   Status is: Observation  The patient will require care spanning > 2 midnights and should be moved to inpatient because: Inpatient level of care appropriate due to severity of illness  Dispo: The patient is from: Home              Anticipated d/c is to: Home              Anticipated d/c date is: 1 day              Patient currently is not medically stable to d/c. Dyspnea with exertion, bounceback readmission, symptoms not back to baseline. Will need PT OT    Consultants:   none  Procedures:   none  Antimicrobials:  Anti-infectives (From admission, onward)   None        Objective: Vitals:   08/11/19 0013 08/11/19 0057 08/11/19 0628 08/11/19 0746  BP:  (!) 144/56 (!) 176/68 (!) 179/57  Pulse:  82 82 79  Resp:  20 20 19   Temp:  98.1 F (36.7 C) 98.1 F (36.7 C) 98.3 F (36.8 C)  TempSrc:  SpO2:  96% 96% 98%  Weight: 93 kg     Height: 5\' 6"  (1.676 m)      No intake or output data in the 24 hours ending 08/11/19 1327 Filed Weights   08/10/19 1208 08/11/19 0013  Weight: 93 kg 93 kg    Examination: General exam: Appears calm and comfortable  Respiratory system: Clear to auscultation. Respiratory effort normal. No respiratory distress. No conversational dyspnea. On  O2  Cardiovascular system: S1 & S2 heard, RRR. No murmurs. No pedal edema. Gastrointestinal system: Abdomen is  nondistended, soft and nontender. Normal bowel sounds heard. Central nervous system: Alert and oriented. No focal neurological deficits. Speech clear.  Extremities: Symmetric in appearance  Skin: No rashes, lesions or ulcers on exposed skin  Psychiatry: Judgement and insight appear normal. Mood & affect appropriate.   Data Reviewed: I have personally reviewed following labs and imaging studies  CBC: Recent Labs  Lab 08/08/19 1559 08/09/19 0508 08/10/19 1230 08/11/19 0313  WBC 7.9 5.6 9.9 8.3  NEUTROABS 5.7  --   --   --   HGB 9.2* 8.6* 9.5* 8.8*  HCT 30.7* 29.0* 31.6* 28.2*  MCV 103.4* 101.0* 101.9* 98.3  PLT 172 176 168 0000000   Basic Metabolic Panel: Recent Labs  Lab 08/08/19 1559 08/09/19 0508 08/10/19 1230 08/11/19 0313  NA 137 140 140 141  K 4.4 4.4 4.9 4.0  CL 99 99 95* 97*  CO2 33* 33* 33* 36*  GLUCOSE 124* 215* 185* 121*  BUN 18 23 25* 17  CREATININE 0.85 1.03* 1.39* 1.00  CALCIUM 8.9 8.9 9.1 8.8*   GFR: Estimated Creatinine Clearance: 43.8 mL/min (by C-G formula based on SCr of 1 mg/dL). Liver Function Tests: Recent Labs  Lab 08/09/19 0508 08/10/19 1418 08/10/19 1435  AST 23 30 30   ALT 20 26 25   ALKPHOS 76 73 72  BILITOT 0.8 0.9 0.6  PROT 5.9* 6.6 6.4*  ALBUMIN 2.9* 3.2* 3.2*   No results for input(s): LIPASE, AMYLASE in the last 168 hours. No results for input(s): AMMONIA in the last 168 hours. Coagulation Profile: No results for input(s): INR, PROTIME in the last 168 hours. Cardiac Enzymes: No results for input(s): CKTOTAL, CKMB, CKMBINDEX, TROPONINI in the last 168 hours. BNP (last 3 results) No results for input(s): PROBNP in the last 8760 hours. HbA1C: Recent Labs    08/11/19 0313  HGBA1C 5.9*   CBG: No results for input(s): GLUCAP in the last 168 hours. Lipid Profile: No results for input(s): CHOL, HDL, LDLCALC, TRIG, CHOLHDL, LDLDIRECT in the last 72 hours. Thyroid Function Tests: No results for input(s): TSH, T4TOTAL, FREET4,  T3FREE, THYROIDAB in the last 72 hours. Anemia Panel: No results for input(s): VITAMINB12, FOLATE, FERRITIN, TIBC, IRON, RETICCTPCT in the last 72 hours. Sepsis Labs: No results for input(s): PROCALCITON, LATICACIDVEN in the last 168 hours.  Recent Results (from the past 240 hour(s))  Respiratory Panel by RT PCR (Flu A&B, Covid) - Nasopharyngeal Swab     Status: None   Collection Time: 08/08/19  7:02 PM   Specimen: Nasopharyngeal Swab  Result Value Ref Range Status   SARS Coronavirus 2 by RT PCR NEGATIVE NEGATIVE Final    Comment: (NOTE) SARS-CoV-2 target nucleic acids are NOT DETECTED. The SARS-CoV-2 RNA is generally detectable in upper respiratoy specimens during the acute phase of infection. The lowest concentration of SARS-CoV-2 viral copies this assay can detect is 131 copies/mL. A negative result does not preclude SARS-Cov-2 infection and should not be used  as the sole basis for treatment or other patient management decisions. A negative result may occur with  improper specimen collection/handling, submission of specimen other than nasopharyngeal swab, presence of viral mutation(s) within the areas targeted by this assay, and inadequate number of viral copies (<131 copies/mL). A negative result must be combined with clinical observations, patient history, and epidemiological information. The expected result is Negative. Fact Sheet for Patients:  PinkCheek.be Fact Sheet for Healthcare Providers:  GravelBags.it This test is not yet ap proved or cleared by the [place] FDA and  has been authorized for detection and/or diagnosis of SARS-CoV-2 by FDA under an Emergency Use Authorization (EUA). This EUA will remain  in effect (meaning this test can be used) for the duration of the COVID-19 declaration under Section 564(b)(1) of the Act, 21 U.S.C. section 360bbb-3(b)(1), unless the authorization is terminated or revoked  sooner.    Influenza A by PCR NEGATIVE NEGATIVE Final   Influenza B by PCR NEGATIVE NEGATIVE Final    Comment: (NOTE) The Xpert Xpress SARS-CoV-2/FLU/RSV assay is intended as an aid in  the diagnosis of influenza from Nasopharyngeal swab specimens and  should not be used as a sole basis for treatment. Nasal washings and  aspirates are unacceptable for Xpert Xpress SARS-CoV-2/FLU/RSV  testing. Fact Sheet for Patients: PinkCheek.be Fact Sheet for Healthcare Providers: GravelBags.it This test is not yet approved or cleared by the [place] FDA and  has been authorized for detection and/or diagnosis of SARS-CoV-2 by  FDA under an Emergency Use Authorization (EUA). This EUA will remain  in effect (meaning this test can be used) for the duration of the  Covid-19 declaration under Section 564(b)(1) of the Act, 21  U.S.C. section 360bbb-3(b)(1), unless the authorization is  terminated or revoked. Performed at [facility], [address] [place], [place] [address]   Respiratory Panel by RT PCR (Flu A&B, Covid) - Nasopharyngeal Swab     Status: None   Collection Time: 08/10/19  5:06 PM   Specimen: Nasopharyngeal Swab  Result Value Ref Range Status   SARS Coronavirus 2 by RT PCR NEGATIVE NEGATIVE Final    Comment: (NOTE) SARS-CoV-2 target nucleic acids are NOT DETECTED. The SARS-CoV-2 RNA is generally detectable in upper respiratoy specimens during the acute phase of infection. The lowest concentration of SARS-CoV-2 viral copies this assay can detect is 131 copies/mL. A negative result does not preclude SARS-Cov-2 infection and should not be used as the sole basis for treatment or other patient management decisions. A negative result may occur with  improper specimen collection/handling, submission of specimen other than nasopharyngeal swab, presence of viral mutation(s) within the areas targeted by this assay, and inadequate  number of viral copies (<131 copies/mL). A negative result must be combined with clinical observations, patient history, and epidemiological information. The expected result is Negative. Fact Sheet for Patients:  PinkCheek.be Fact Sheet for Healthcare Providers:  GravelBags.it This test is not yet ap proved or cleared by the [place] FDA and  has been authorized for detection and/or diagnosis of SARS-CoV-2 by FDA under an Emergency Use Authorization (EUA). This EUA will remain  in effect (meaning this test can be used) for the duration of the COVID-19 declaration under Section 564(b)(1) of the Act, 21 U.S.C. section 360bbb-3(b)(1), unless the authorization is terminated or revoked sooner.    Influenza A by PCR NEGATIVE NEGATIVE Final   Influenza B by PCR NEGATIVE NEGATIVE Final    Comment: (NOTE) The Xpert Xpress SARS-CoV-2/FLU/RSV assay is  intended as an aid in  the diagnosis of influenza from Nasopharyngeal swab specimens and  should not be used as a sole basis for treatment. Nasal washings and  aspirates are unacceptable for Xpert Xpress SARS-CoV-2/FLU/RSV  testing. Fact Sheet for Patients: PinkCheek.be Fact Sheet for Healthcare Providers: GravelBags.it This test is not yet approved or cleared by the [place] FDA and  has been authorized for detection and/or diagnosis of SARS-CoV-2 by  FDA under an Emergency Use Authorization (EUA). This EUA will remain  in effect (meaning this test can be used) for the duration of the  Covid-19 declaration under Section 564(b)(1) of the Act, 21  U.S.C. section 360bbb-3(b)(1), unless the authorization is  terminated or revoked. Performed at [facility], [place] [address] [place], [place] [address]       Radiology Studies: DG Chest 2 View  Result Date: 08/10/2019 CLINICAL DATA:  Shortness of breath. EXAM: CHEST  - 2 VIEW COMPARISON:  08/08/2019 chest x-ray. FINDINGS: The cardiac silhouette, mediastinal and hilar contours are within normal limits and stable. Moderate tortuosity and calcification of the thoracic aorta. Chronic underlying bronchitic interstitial lung changes and areas of pulmonary scarring. Persistent small right pleural effusion with overlying atelectasis. No focal infiltrates or overt pulmonary edema. No pneumothorax. IMPRESSION: Persistent small right pleural effusion with overlying atelectasis. No infiltrates or pulmonary edema. Electronically Signed   By: Marijo Sanes M.D.   On: 08/10/2019 12:38   CT Angio Chest PE W and/or Wo Contrast  Result Date: 08/10/2019 CLINICAL DATA:  Shortness of breath, hypoxia EXAM: CT ANGIOGRAPHY CHEST WITH CONTRAST TECHNIQUE: Multidetector CT imaging of the chest was performed using the standard protocol during bolus administration of intravenous contrast. Multiplanar CT image reconstructions and MIPs were obtained to evaluate the vascular anatomy. CONTRAST:  90mL OMNIPAQUE IOHEXOL 350 MG/ML SOLN COMPARISON:  08/10/2019, 03/20/2019 FINDINGS: Cardiovascular: This is a technically adequate evaluation of the pulmonary vasculature. No filling defects or pulmonary emboli. The heart is not enlarged. There is dense calcification of the mitral annulus and aortic valve. There is significant atherosclerosis of the aortic arch. No evidence of thoracic aortic aneurysm. Mediastinum/Nodes: Mediastinal adenopathy measures up to 15 mm in the precarinal region, nonspecific. This is new since prior study. Thyroid, trachea, and esophagus are unremarkable. Lungs/Pleura: There are small bilateral pleural effusions, right greater than left. Dependent consolidation is seen within the bilateral lower lobes, right greater than left. Mild diffuse interlobular septal thickening with scattered ground-glass opacities most consistent with mild edema. No pneumothorax. Central airways are patent. Upper  Abdomen: No acute abnormality. Musculoskeletal: No acute or destructive bony lesions. Right shoulder arthroplasty partially visualized. Reconstructed images demonstrate no additional findings. Review of the MIP images confirms the above findings. IMPRESSION: 1. No evidence of pulmonary embolus. 2. Small bilateral pleural effusions, right greater than left. 3. Interlobular septal thickening and ground-glass airspace disease most consistent with mild edema. 4. Nonspecific mediastinal adenopathy, new since prior study. 5.  Aortic aneurysm NOS (ICD10-I71.9). Electronically Signed   By: Randa Ngo M.D.   On: 08/10/2019 19:43   ECHOCARDIOGRAM COMPLETE  Result Date: 08/11/2019    ECHOCARDIOGRAM REPORT   Patient Name:   KATONA SECKER Date of Exam: 08/11/2019 Medical Rec #:  [id number]     Height:       66.0 in Accession #:    [id number]    Weight:       205.0 lb Date of Birth:  Nov 18, 1928      BSA:  2.021 m Patient Age:    37 years      BP:           179/57 mmHg Patient Gender: F             HR:           79 bpm. Exam Location:  Inpatient Procedure: 2D Echo Indications:    Congestive Heart Failure 428.0 / I50.  History:        Patient has prior history of Echocardiogram examinations, most                 recent 02/18/2013. COPD, Signs/Symptoms:Dyspnea; Risk                 Factors:Hypertension, Diabetes, Dyslipidemia and Former Smoker.                 GERD. Past history of cancer. Chronic kidney disease.  Sonographer:    Darlina Sicilian RDCS Referring Phys: [id number] DAVID MANUEL Woodruff  1. Left ventricular ejection fraction, by estimation, is 60 to 65%. The left ventricle has normal function. The left ventricle has no regional wall motion abnormalities. Left ventricular diastolic parameters are consistent with Grade I diastolic dysfunction (impaired relaxation). Elevated left atrial pressure.  2. Right ventricular systolic function is normal. The right ventricular size is normal.  3. Left atrial size was  moderately dilated.  4. The mitral valve is degenerative. No evidence of mitral valve regurgitation. Mild mitral stenosis. The mean mitral valve gradient is 6.5 mmHg.  5. The aortic valve is tricuspid. Aortic valve regurgitation is not visualized. Mild aortic valve stenosis. Aortic valve mean gradient measures 11.5 mmHg. Aortic valve Vmax measures 2.55 m/s.  6. The inferior vena cava is normal in size with greater than 50% respiratory variability, suggesting right atrial pressure of 3 mmHg. FINDINGS  Left Ventricle: Left ventricular ejection fraction, by estimation, is 60 to 65%. The left ventricle has normal function. The left ventricle has no regional wall motion abnormalities. The left ventricular internal cavity size was normal in size. There is  no left ventricular hypertrophy. Left ventricular diastolic parameters are consistent with Grade I diastolic dysfunction (impaired relaxation). Elevated left atrial pressure. Right Ventricle: The right ventricular size is normal. No increase in right ventricular wall thickness. Right ventricular systolic function is normal. Left Atrium: Left atrial size was moderately dilated. Right Atrium: Right atrial size was normal in size. Pericardium: There is no evidence of pericardial effusion. Mitral Valve: The mitral valve is degenerative in appearance. There is moderate thickening of the mitral valve leaflet(s). There is moderate calcification of the mitral valve leaflet(s). Normal mobility of the mitral valve leaflets. Severe mitral annular  calcification. No evidence of mitral valve regurgitation. Mild mitral valve stenosis. MV peak gradient, 13.7 mmHg. The mean mitral valve gradient is 6.5 mmHg. Tricuspid Valve: The tricuspid valve is normal in structure. Tricuspid valve regurgitation is not demonstrated. No evidence of tricuspid stenosis. Aortic Valve: The aortic valve is tricuspid. . There is moderate thickening and moderate calcification of the aortic valve. Aortic  valve regurgitation is not visualized. Mild aortic stenosis is present. There is moderate thickening of the aortic valve. There is moderate calcification of the aortic valve. Aortic valve mean gradient measures 11.5 mmHg. Aortic valve peak gradient measures 26.1 mmHg. Aortic valve area, by VTI measures 1.03 cm. Pulmonic Valve: The pulmonic valve was normal in structure. Pulmonic valve regurgitation is not visualized. No evidence of pulmonic stenosis. Aorta: The aortic root is  normal in size and structure. Venous: The inferior vena cava is normal in size with greater than 50% respiratory variability, suggesting right atrial pressure of 3 mmHg. IAS/Shunts: No atrial level shunt detected by color flow Doppler.  LEFT VENTRICLE PLAX 2D LVIDd:         3.60 cm  Diastology LVIDs:         2.60 cm  LV e' lateral:   4.28 cm/s LV PW:         1.30 cm  LV E/e' lateral: 32.0 LV IVS:        1.20 cm  LV e' medial:    2.48 cm/s LVOT diam:     1.70 cm  LV E/e' medial:  55.2 LV SV:         45 LV SV Index:   22 LVOT Area:     2.27 cm  RIGHT VENTRICLE RV S prime:     15.30 cm/s TAPSE (M-mode): 2.5 cm LEFT ATRIUM              Index       RIGHT ATRIUM           Index LA diam:        3.50 cm  1.73 cm/m  RA Area:     13.70 cm LA Vol (A2C):   109.0 ml 53.94 ml/m RA Volume:   30.70 ml  15.19 ml/m LA Vol (A4C):   87.8 ml  43.45 ml/m LA Biplane Vol: 97.6 ml  48.30 ml/m  AORTIC VALVE AV Area (Vmax):    1.07 cm AV Area (Vmean):   1.20 cm AV Area (VTI):     1.03 cm AV Vmax:           255.50 cm/s AV Vmean:          149.500 cm/s AV VTI:            0.437 m AV Peak Grad:      26.1 mmHg AV Mean Grad:      11.5 mmHg LVOT Vmax:         121.00 cm/s LVOT Vmean:        78.800 cm/s LVOT VTI:          0.199 m LVOT/AV VTI ratio: 0.46  AORTA Ao Root diam: 2.90 cm MITRAL VALVE MV Area (PHT): 3.22 cm     SHUNTS MV Peak grad:  13.7 mmHg    Systemic VTI:  0.20 m MV Mean grad:  6.5 mmHg     Systemic Diam: 1.70 cm MV Vmax:       1.85 m/s MV Vmean:       120.0 cm/s MV Decel Time: 235 msec MV E velocity: 137.00 cm/s MV A velocity: 159.00 cm/s MV E/A ratio:  0.86 Candee Furbish MD Electronically signed by Candee Furbish MD Signature Date/Time: 08/11/2019/11:08:47 AM    Final       Scheduled Meds: . amLODipine  10 mg Oral Daily  . atorvastatin  40 mg Oral Daily  . fluticasone  2 spray Each Nare Daily  . [START ON 08/12/2019] furosemide  20 mg Oral Daily  . gabapentin  600 mg Oral TID  . levothyroxine  50 mcg Oral Q0600  . potassium chloride  10 mEq Oral Daily  . sodium chloride flush  3 mL Intravenous Once   Continuous Infusions:   LOS: 0 days      Time spent: 35 minutes   Dessa Phi, DO [facility] 08/11/2019, 1:27 PM  Available via Epic secure chat 7am-7pm After these hours, please refer to coverage provider listed on [email or web address]

## 2019-08-11 NOTE — Evaluation (Signed)
Physical Therapy Evaluation Patient Details Name: Sandra Cuevas MRN: [id number] DOB: 11-29-1928 Today's Date: 08/11/2019   History of Present Illness  86 yo female admitted to ED on 5/4 with O2 desaturation to 80s on 2LO2, dyspnea on exertion. Pt with recent admission for similar at [facility], respiratory issues unresolved. Pt with medical diagnoses of COPD exaceration, CHF, CXR negative for PE but + for pleural effusion R>L. PMH includes COPD on 2LO2 chronically, DM, HLD, HTN, tubular adenoma of colon, cervical cancer, R shoulder replacement.  Clinical Impression   Pt presents with LE weakness, unsteadiness in standing, dyspnea on exertion with decreased knowledge of breathing technique, and decreased activity tolerance. Pt to benefit from acute PT to address deficits. Pt ambulated hallway distance with use of RW and min guard assist from PT. When able to accurately check pt's sats immediately post-ambulation, SpO2 97% on 2LO2 which is pt's baseline O2 setting. Pt recommending home with HHPT to address deficits and decrease pt fall risk. PT to progress mobility as tolerated, and will continue to follow acutely.      Follow Up Recommendations Home health PT;Supervision for mobility/OOB    Equipment Recommendations  None recommended by PT    Recommendations for Other Services       Precautions / Restrictions Precautions Precautions: Fall Restrictions Weight Bearing Restrictions: No      Mobility  Bed Mobility Overal bed mobility: Needs Assistance             General bed mobility comments: up in recliner upon arrival to room, requests stay in recliner upon PT exit  Transfers Overall transfer level: Needs assistance Equipment used: Rolling walker (2 wheeled) Transfers: Sit to/from Stand Sit to Stand: Min guard         General transfer comment: for safety, increased time to rise. Sit to stand x2, from recliner and toilet.  Ambulation/Gait Ambulation/Gait assistance: Min  guard Gait Distance (Feet): 220 Feet Assistive device: Rolling walker (2 wheeled) Gait Pattern/deviations: Decreased step length - right;Decreased step length - left;Decreased stride length;Trunk flexed Gait velocity: decr   General Gait Details: min guard for safety, + trendelenburg gait and slight knee flexion during bilateral stance however no buckling. Standing rest braeks x2, pulse ox unable to get a good read but once back to room dynamap reads 97% on 2LO2 via [place].  Stairs            Wheelchair Mobility    Modified Rankin (Stroke Patients Only)       Balance Overall balance assessment: Needs assistance Sitting-balance support: No upper extremity supported;Feet supported Sitting balance-Leahy Scale: Good     Standing balance support: Bilateral upper extremity supported;During functional activity Standing balance-Leahy Scale: Poor Standing balance comment: reliant on use of RW                             Pertinent Vitals/Pain Pain Assessment: No/denies pain    Home Living Family/patient expects to be discharged to:: Private residence Living Arrangements: Alone Available Help at Discharge: Family;Available PRN/intermittently Type of Home: House Home Access: Level entry Entrance Stairs-Rails: None Entrance Stairs-Number of Steps: level Home Layout: One level Home Equipment: Walker - 2 wheels;Shower seat;Bedside commode;Wheelchair - manual;Cane - single point      Prior Function Level of Independence: Independent with assistive device(s)         Comments: pt reports using RW for ambulation, states she has assist with cleaning 1x a month but otherwise  is independent with ADLs/IADLs     Hand Dominance   Dominant Hand: Right    Extremity/Trunk Assessment   Upper Extremity Assessment Upper Extremity Assessment: Defer to OT evaluation    Lower Extremity Assessment Lower Extremity Assessment: Generalized weakness(+ trendelenburg gait)     Cervical / Trunk Assessment Cervical / Trunk Assessment: Normal  Communication   Communication: No difficulties  Cognition Arousal/Alertness: Awake/alert Behavior During Therapy: WFL for tasks assessed/performed Overall Cognitive Status: Within Functional Limits for tasks assessed                                        General Comments      Exercises     Assessment/Plan    PT Assessment Patient needs continued PT services  PT Problem List Decreased strength;Decreased activity tolerance;Decreased balance;Decreased mobility;Decreased safety awareness;Decreased knowledge of use of DME;Pain       PT Treatment Interventions Balance training;Gait training;Functional mobility training;Therapeutic activities;Therapeutic exercise;Patient/family education;DME instruction    PT Goals (Current goals can be found in the Care Plan section)  Acute Rehab PT Goals Patient Stated Goal: go home with HHPT PT Goal Formulation: With patient Time For Goal Achievement: 08/25/19 Potential to Achieve Goals: Good    Frequency Min 3X/week   Barriers to discharge        Co-evaluation               AM-PAC PT "6 Clicks" Mobility  Outcome Measure Help needed turning from your back to your side while in a flat bed without using bedrails?: A Little Help needed moving from lying on your back to sitting on the side of a flat bed without using bedrails?: A Little Help needed moving to and from a bed to a chair (including a wheelchair)?: A Little Help needed standing up from a chair using your arms (e.g., wheelchair or bedside chair)?: A Little Help needed to walk in hospital room?: A Little Help needed climbing 3-5 steps with a railing? : A Lot 6 Click Score: 17    End of Session Equipment Utilized During Treatment: Oxygen Activity Tolerance: Patient tolerated treatment well;Patient limited by fatigue Patient left: in chair;with call bell/phone within reach;with chair alarm  set Nurse Communication: Mobility status PT Visit Diagnosis: Unsteadiness on feet (R26.81);Other abnormalities of gait and mobility (R26.89);Muscle weakness (generalized) (M62.81)    Time: [id number] PT Time Calculation (min) (ACUTE ONLY): 23 min   Charges:   PT Evaluation $PT Eval Low Complexity: 1 Low PT Treatments $Gait Training: 8-22 mins       Arlean Thies E, PT Acute Rehabilitation Services Pager [phone]  Office [phone]  Nai Dasch D Issiac Jamar 08/11/2019, 5:14 PM

## 2019-08-12 LAB — BASIC METABOLIC PANEL
Anion gap: 8 (ref 5–15)
BUN: 23 mg/dL (ref 8–23)
CO2: 37 mmol/L — ABNORMAL HIGH (ref 22–32)
Calcium: 8.8 mg/dL — ABNORMAL LOW (ref 8.9–10.3)
Chloride: 96 mmol/L — ABNORMAL LOW (ref 98–111)
Creatinine, Ser: 1.16 mg/dL — ABNORMAL HIGH (ref 0.44–1.00)
GFR calc Af Amer: 48 mL/min — ABNORMAL LOW (ref >60)
GFR calc non Af Amer: 42 mL/min — ABNORMAL LOW (ref >60)
Glucose, Bld: 96 mg/dL (ref 70–99)
Potassium: 4.3 mmol/L (ref 3.5–5.1)
Sodium: 141 mmol/L (ref 135–145)

## 2019-08-12 LAB — GLUCOSE, CAPILLARY
Glucose-Capillary: 111 mg/dL — ABNORMAL HIGH (ref 70–99)
Glucose-Capillary: 251 mg/dL — ABNORMAL HIGH (ref 70–99)

## 2019-08-12 MED ORDER — FUROSEMIDE 20 MG PO TABS
20.0000 mg | ORAL_TABLET | Freq: Every day | ORAL | 0 refills | Status: DC
Start: 2019-08-12 — End: 2022-12-05

## 2019-08-12 NOTE — Evaluation (Signed)
Occupational Therapy Evaluation and defer to [facility] Patient Details Name: Sandra Cuevas MRN: [id number] DOB: [birth year] Today's Date: 08/12/2019    History of Present Illness 86 yo female admitted to ED on 5/4 with O2 desaturation to 80s on 2LO2, dyspnea on exertion. Pt with recent admission for similar at [facility], respiratory issues unresolved. Pt with medical diagnoses of COPD exaceration, CHF, CXR negative for PE but + for pleural effusion R>L. PMH includes COPD on 2LO2 chronically, DM, HLD, HTN, tubular adenoma of colon, cervical cancer, R shoulder replacement.   Clinical Impression   PTA Pt mod I in home environment, today she feels like she is at her baseline. She was able to walk with RW, perform toilet transfer, peri care, and grooming at min guard/supervision level. Pt shares that her biggest concern is her declining eye sight due to macular degeneration. Educated on basic low vision strategies, but feel that she would really benefit from skilled OT in the home setting with a focus on vision, in addition to fall risk evaluation to maximize safety and independence in ADL and functional transfers.    Follow Up Recommendations  Home health OT;Supervision/Assistance - 24 hour(initially, HHOT with visual focus)    Equipment Recommendations  None recommended by OT    Recommendations for Other Services       Precautions / Restrictions Precautions Precautions: Fall Restrictions Weight Bearing Restrictions: No      Mobility Bed Mobility Overal bed mobility: Needs Assistance Bed Mobility: Supine to Sit     Supine to sit: Supervision     General bed mobility comments: use of bed rail to assist, no physical assist needed  Transfers Overall transfer level: Needs assistance Equipment used: Rolling walker (2 wheeled) Transfers: Sit to/from Stand Sit to Stand: Min guard         General transfer comment: for safety, increased time to rise. Sit to stand x2, from recliner and  toilet.    Balance Overall balance assessment: Needs assistance Sitting-balance support: No upper extremity supported;Feet supported Sitting balance-Leahy Scale: Good Sitting balance - Comments: able to perform functional leans for donning socks   Standing balance support: Single extremity supported;Bilateral upper extremity supported;During functional activity Standing balance-Leahy Scale: Poor Standing balance comment: reliant on use of RW for dynamic movement, able to have SUE support for grooming at sink                           ADL either performed or assessed with clinical judgement   ADL Overall ADL's : Needs assistance/impaired Eating/Feeding: Set up;Sitting   Grooming: Wash/dry hands;Wash/dry face;Oral care;Supervision/safety;Standing Grooming Details (indicate cue type and reason): sink level, some cues due to unfamiliar environment with visual deficits Upper Body Bathing: Set up;Sitting   Lower Body Bathing: Set up;Sitting/lateral leans   Upper Body Dressing : Sitting;Supervision/safety   Lower Body Dressing: Sit to/from stand;Min guard Lower Body Dressing Details (indicate cue type and reason): able to don/doff socks EOB with figure 4 method Toilet Transfer: Min guard;Ambulation;RW Toilet Transfer Details (indicate cue type and reason): good hand placement with RW Toileting- Clothing Manipulation and Hygiene: Set up;Sit to/from stand       Functional mobility during ADLs: Min guard;Rolling walker General ADL Comments: NO SOB during session today, SpO2 remained >90 on 2L O2     Vision Baseline Vision/History: Macular Degeneration Patient Visual Report: No change from baseline Vision Assessment?: Vision impaired- to be further tested in functional context Additional Comments: went  over basic compensatory strategies with low vision with Patient     Perception     Praxis      Pertinent Vitals/Pain Pain Assessment: No/denies pain     Hand  Dominance Right   Extremity/Trunk Assessment Upper Extremity Assessment Upper Extremity Assessment: Generalized weakness   Lower Extremity Assessment Lower Extremity Assessment: Defer to PT evaluation   Cervical / Trunk Assessment Cervical / Trunk Assessment: Normal   Communication Communication Communication: No difficulties   Cognition Arousal/Alertness: Awake/alert Behavior During Therapy: WFL for tasks assessed/performed Overall Cognitive Status: Within Functional Limits for tasks assessed                                     General Comments       Exercises     Shoulder Instructions      Home Living Family/patient expects to be discharged to:: Private residence Living Arrangements: Alone Available Help at Discharge: Family;Available PRN/intermittently Type of Home: House Home Access: Level entry Entrance Stairs-Number of Steps: level Entrance Stairs-Rails: None Home Layout: One level         Bathroom Toilet: Handicapped height Bathroom Accessibility: Yes   Home Equipment: Walker - 2 wheels;Shower seat;Bedside commode;Wheelchair - manual;Cane - single point          Prior Functioning/Environment Level of Independence: Independent with assistive device(s)        Comments: pt reports using RW for ambulation, states she has assist with cleaning 1x a month but otherwise is independent with ADLs/IADLs        OT Problem List: Decreased activity tolerance;Impaired balance (sitting and/or standing);Cardiopulmonary status limiting activity      OT Treatment/Interventions:      OT Goals(Current goals can be found in the care plan section) Acute Rehab OT Goals Patient Stated Goal: go home today OT Goal Formulation: With patient Time For Goal Achievement: 08/26/19 Potential to Achieve Goals: Good  OT Frequency:     Barriers to D/C:            Co-evaluation              AM-PAC OT "6 Clicks" Daily Activity     Outcome Measure  Help from another person eating meals?: A Little Help from another person taking care of personal grooming?: A Little Help from another person toileting, which includes using toliet, bedpan, or urinal?: A Little Help from another person bathing (including washing, rinsing, drying)?: A Little Help from another person to put on and taking off regular upper body clothing?: A Little Help from another person to put on and taking off regular lower body clothing?: A Little 6 Click Score: 18   End of Session Equipment Utilized During Treatment: Gait belt;Rolling walker;Oxygen(2L) Nurse Communication: Mobility status  Activity Tolerance: Patient tolerated treatment well Patient left: in chair;with call bell/phone within reach;with chair alarm set  OT Visit Diagnosis: Muscle weakness (generalized) (M62.81)                Time: [id number] OT Time Calculation (min): 46 min Charges:  OT General Charges $OT Visit: 1 Visit OT Evaluation $OT Eval Moderate Complexity: 1 Mod OT Treatments $Self Care/Home Management : 8-22 mins  Jesse Sans OTR/L Acute Rehabilitation Services Pager: [phone] Office: [place] 08/12/2019, 12:13 PM

## 2019-08-12 NOTE — Plan of Care (Signed)

## 2019-08-12 NOTE — TOC Transition Note (Signed)
Transition of Care [facility]) - CM/SW Discharge Note   Patient Details  Name: RUWAYDA BALINGIT MRN: [id number] Date of Birth: May 22, 1928  Transition of Care [facility]) CM/SW Contact:  Bethann Berks[place], [place] Phone Number: 08/12/2019, 9:35 AM   Clinical Narrative:     Patient will DC to: Home with [place] date:08/12/19  Confirmed welllcare with pt. Notified [place] from [place] of pts discharge.   CSW will sign off for now as social work intervention is no longer needed. Please consult [place] again if new needs arise.  Final next level of care: [place] Barriers to Discharge: Continued Medical Work up   Patient Goals and CMS Choice Patient states their goals for this hospitalization and ongoing recovery are:: Return home      Discharge Placement                       Discharge Plan and Services                                     Social Determinants of Health (SDOH) Interventions     Readmission Risk Interventions No flowsheet data found.

## 2019-08-12 NOTE — Discharge Summary (Signed)
Physician Discharge Summary  AUTIANA BAULT [id number] DOB: [birth year] DOA: 08/10/2019  PCP: Sharilyn Sites, MD  Admit date: 08/10/2019 Discharge date: 08/12/2019  Admitted From: Home Disposition:  Home with home health PT   Recommendations for Outpatient Follow-up:  1. Follow up with PCP in 1 week  Discharge Condition: Stable CODE STATUS: DNR  Diet recommendation:  Diet Orders (From admission, onward)    Start     Ordered   08/12/19 0000  Diet - low sodium heart healthy     08/12/19 0912   08/11/19 1836  Diet Heart Room service appropriate? Yes with Assist; Fluid consistency: Thin; Fluid restriction: 1500 mL Fluid  Diet effective now    Question Answer Comment  Room service appropriate? 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   She feels well at rest, but has oxygen desaturation with activities of daily living. Chest radiograph show persistent small right pleural effusion with overlying atelectasis. No infiltrates or pulmonary edema. CTA chest did not show evidence of PE. There was small bilateral pleural effusions, right greater than left. Interlobular septal thickening and groundglass airspace disease likely due to mild edema.   Denies any peripheral edema. She was admitted due to dyspnea on exertion secondary to physical deconditioning as well as acute diastolic heart  failure exacerbation. She was given IV diuresis which was transitioned to PO. On day of discharge, she reported feeling great, worked with PT which went well, and felt ready to be discharged home.   Discharge Diagnoses:  Principal Problem:   Dyspnea Active Problems:   Dyslipidemia   Benign essential HTN   Hypothyroidism   Chronic obstructive airway disease with asthma (HCC)   Diabetes mellitus, type II (HCC)   CKD (chronic kidney disease) stage 3, GFR 30-59 ml/min   Dyspnea with exertion secondary to physical deconditioning, recent hospitalization for COPD exacerbation, acute diastolic heart failure exacerbation, chronic hypoxemic respiratory failure -Requires 2 L nasal cannula at baseline -CTA chest without evidence of PE, R>L pleural effusion, and interlobular septal thickening and ground-glass airspace disease most consistent with mild edema. -IV Lasix --> p.o. Lasix daily  -Echocardiogram EF 60-65%, grade 1 dd  -Daily weights, strict I's and O's -PT OT rec home health   AKI on CKD stage IIIb -Baseline creatinine 0.9-1 -Resolved  Hyperlipidemia -Continue atorvastatin  Essential hypertension -Continue amlodipine  Hypothyroidism -Continue Synthroid  Diabetes mellitus type 2, well controlled -Hemoglobin A1c 5.9    Discharge Instructions  Discharge Instructions    (HEART FAILURE PATIENTS) Call MD:  Anytime you have any of the following symptoms: 1) 3 pound weight gain in 24 hours or 5 pounds in 1 week 2) shortness of breath, with or without a dry hacking cough 3) swelling in the hands, feet or stomach 4) if you have to sleep on extra pillows at night in order to breathe.   Complete by: As directed    Call MD  for:  difficulty breathing, headache or visual disturbances   Complete by: As directed    Call MD for:  extreme fatigue   Complete by: As directed    Call MD for:  persistant dizziness or light-headedness   Complete by: As directed    Call MD for:  persistant  nausea and vomiting   Complete by: As directed    Call MD for:  severe uncontrolled pain   Complete by: As directed    Call MD for:  temperature >100.4   Complete by: As directed    Diet - low sodium heart healthy   Complete by: As directed    Discharge instructions   Complete by: As directed    You were cared for by a hospitalist during your hospital stay. If you have any questions about your discharge medications or the care you received while you were in the hospital after you are discharged, you can call the unit and ask to speak with the hospitalist on call if the hospitalist that took care of you is not available. Once you are discharged, your primary care physician will handle any further medical issues. Please note that NO REFILLS for any discharge medications will be authorized once you are discharged, as it is imperative that you return to your primary care physician (or establish a relationship with a primary care physician if you do not have one) for your aftercare needs so that they can reassess your need for medications and monitor your lab values.   Increase activity slowly   Complete by: As directed      Allergies as of 08/12/2019   No Known Allergies     Medication List    STOP taking these medications   hydrALAZINE 50 MG tablet Commonly known as: APRESOLINE   sitaGLIPtin 50 MG tablet Commonly known as: Januvia     TAKE these medications   acetaminophen 500 MG tablet Commonly known as: TYLENOL Take 2 tablets (1,000 mg total) by mouth every 6 (six) hours as needed.   albuterol (5 MG/ML) 0.5% nebulizer solution Commonly known as: PROVENTIL Take 0.5 mLs (2.5 mg total) by nebulization every 4 (four) hours as needed for wheezing or shortness of breath.   amLODipine 10 MG tablet Commonly known as: NORVASC Take 1 tablet (10 mg total) by mouth daily.   atorvastatin 40 MG tablet Commonly known as: LIPITOR TAKE 1 TABLET(40 MG) BY MOUTH DAILY AT 6 PM What changed:    how much to take  how to take this  when to take this  additional instructions   fluticasone 50 MCG/ACT nasal spray Commonly known as: FLONASE SHAKE LIQUID AND USE 2 SPRAYS IN EACH NOSTRIL DAILY   furosemide 20 MG tablet Commonly known as: Lasix Take 1 tablet (20 mg total) by mouth daily. What changed: when to take this   gabapentin 600 MG tablet Commonly known as: NEURONTIN Take 600 mg by mouth 3 (three) times daily.   guaiFENesin 600 MG 12 hr tablet Commonly known as: MUCINEX Take 1 tablet (600 mg total) by mouth 2 (two) times daily as needed for up to 5 days for to loosen phlegm.   levothyroxine 50 MCG tablet Commonly known as: SYNTHROID Take 50 mcg by mouth daily.   Murine Tears for Dry Eyes 5-6 MG/ML Soln Generic drug: Polyvinyl Alcohol-Povidone Place 1 drop into both eyes daily as needed (Dry Eyes).   potassium chloride 10 MEQ CR capsule Commonly known as: MICRO-K Take 10 mEq by mouth daily.  PRESERVISION AREDS 2+MULTI VIT PO Take 1 tablet by mouth daily.   traMADol 50 MG tablet Commonly known as: ULTRAM Take 50 mg by mouth every 4 (four) hours as needed for pain.   zolpidem 10 MG tablet Commonly known as: AMBIEN Take 5 mg by mouth at bedtime as needed for sleep.      Follow-up Information    Sharilyn Sites, MD. Schedule an appointment as soon as possible for a visit in 1 week(s).   Specialty: Family Medicine Contact information: [address] [place] [id number] [phone]        Nahser, Wonda Cheng, MD .   Specialty: Cardiology Contact information: [address] [place] [address] [phone]          No Known Allergies  Consultations:  None    Procedures/Studies: DG Chest 2 View  Result Date: 08/10/2019 CLINICAL DATA:  Shortness of breath. EXAM: CHEST - 2 VIEW COMPARISON:  08/08/2019 chest x-ray. FINDINGS: The cardiac silhouette, mediastinal and hilar contours are within normal limits and stable.  Moderate tortuosity and calcification of the thoracic aorta. Chronic underlying bronchitic interstitial lung changes and areas of pulmonary scarring. Persistent small right pleural effusion with overlying atelectasis. No focal infiltrates or overt pulmonary edema. No pneumothorax. IMPRESSION: Persistent small right pleural effusion with overlying atelectasis. No infiltrates or pulmonary edema. Electronically Signed   By: Marijo Sanes M.D.   On: 08/10/2019 12:38   CT Angio Chest PE W and/or Wo Contrast  Result Date: 08/10/2019 CLINICAL DATA:  Shortness of breath, hypoxia EXAM: CT ANGIOGRAPHY CHEST WITH CONTRAST TECHNIQUE: Multidetector CT imaging of the chest was performed using the standard protocol during bolus administration of intravenous contrast. Multiplanar CT image reconstructions and MIPs were obtained to evaluate the vascular anatomy. CONTRAST:  77mL OMNIPAQUE IOHEXOL 350 MG/ML SOLN COMPARISON:  08/10/2019, 03/20/2019 FINDINGS: Cardiovascular: This is a technically adequate evaluation of the pulmonary vasculature. No filling defects or pulmonary emboli. The heart is not enlarged. There is dense calcification of the mitral annulus and aortic valve. There is significant atherosclerosis of the aortic arch. No evidence of thoracic aortic aneurysm. Mediastinum/Nodes: Mediastinal adenopathy measures up to 15 mm in the precarinal region, nonspecific. This is new since prior study. Thyroid, trachea, and esophagus are unremarkable. Lungs/Pleura: There are small bilateral pleural effusions, right greater than left. Dependent consolidation is seen within the bilateral lower lobes, right greater than left. Mild diffuse interlobular septal thickening with scattered ground-glass opacities most consistent with mild edema. No pneumothorax. Central airways are patent. Upper Abdomen: No acute abnormality. Musculoskeletal: No acute or destructive bony lesions. Right shoulder arthroplasty partially visualized.  Reconstructed images demonstrate no additional findings. Review of the MIP images confirms the above findings. IMPRESSION: 1. No evidence of pulmonary embolus. 2. Small bilateral pleural effusions, right greater than left. 3. Interlobular septal thickening and ground-glass airspace disease most consistent with mild edema. 4. Nonspecific mediastinal adenopathy, new since prior study. 5.  Aortic aneurysm NOS (ICD10-I71.9). Electronically Signed   By: Randa Ngo M.D.   On: 08/10/2019 19:43   DG Chest Port 1 View  Result Date: 08/08/2019 CLINICAL DATA:  Shortness of breath. EXAM: PORTABLE CHEST 1 VIEW COMPARISON:  03/20/2019 FINDINGS: Lungs are adequately inflated demonstrate hazy prominence of the perihilar vessels suggesting mild vascular congestion. Mild bibasilar opacification suggesting small effusions right greater than left likely with associated atelectasis. Infection in the lung bases is possible. Mild stable cardiomegaly. Remainder of the exam is unchanged. IMPRESSION: Mild cardiomegaly with findings  suggesting mild vascular congestion with small effusions right greater than left and associated basilar atelectasis. Infection in the lung bases is possible. Electronically Signed   By: Marin Olp M.D.   On: 08/08/2019 16:16   ECHOCARDIOGRAM COMPLETE  Result Date: 08/11/2019    ECHOCARDIOGRAM REPORT   Patient Name:   KASHIKA KROSS Date of Exam: 08/11/2019 Medical Rec #:  [id number]     Height:       66.0 in Accession #:    [id number]    Weight:       205.0 lb Date of Birth:  October 07, 1928      BSA:          2.021 m Patient Age:    86 years      BP:           179/57 mmHg Patient Gender: F             HR:           79 bpm. Exam Location:  Inpatient Procedure: 2D Echo Indications:    Congestive Heart Failure 428.0 / I50.  History:        Patient has prior history of Echocardiogram examinations, most                 recent 02/18/2013. COPD, Signs/Symptoms:Dyspnea; Risk                 Factors:Hypertension,  Diabetes, Dyslipidemia and Former Smoker.                 GERD. Past history of cancer. Chronic kidney disease.  Sonographer:    Darlina Sicilian RDCS Referring Phys: [id number] DAVID MANUEL [place]  1. Left ventricular ejection fraction, by estimation, is 60 to 65%. The left ventricle has normal function. The left ventricle has no regional wall motion abnormalities. Left ventricular diastolic parameters are consistent with Grade I diastolic dysfunction (impaired relaxation). Elevated left atrial pressure.  2. Right ventricular systolic function is normal. The right ventricular size is normal.  3. Left atrial size was moderately dilated.  4. The mitral valve is degenerative. No evidence of mitral valve regurgitation. Mild mitral stenosis. The mean mitral valve gradient is 6.5 mmHg.  5. The aortic valve is tricuspid. Aortic valve regurgitation is not visualized. Mild aortic valve stenosis. Aortic valve mean gradient measures 11.5 mmHg. Aortic valve Vmax measures 2.55 m/s.  6. The inferior vena cava is normal in size with greater than 50% respiratory variability, suggesting right atrial pressure of 3 mmHg. FINDINGS  Left Ventricle: Left ventricular ejection fraction, by estimation, is 60 to 65%. The left ventricle has normal function. The left ventricle has no regional wall motion abnormalities. The left ventricular internal cavity size was normal in size. There is  no left ventricular hypertrophy. Left ventricular diastolic parameters are consistent with Grade I diastolic dysfunction (impaired relaxation). Elevated left atrial pressure. Right Ventricle: The right ventricular size is normal. No increase in right ventricular wall thickness. Right ventricular systolic function is normal. Left Atrium: Left atrial size was moderately dilated. Right Atrium: Right atrial size was normal in size. Pericardium: There is no evidence of pericardial effusion. Mitral Valve: The mitral valve is degenerative in appearance.  There is moderate thickening of the mitral valve leaflet(s). There is moderate calcification of the mitral valve leaflet(s). Normal mobility of the mitral valve leaflets. Severe mitral annular  calcification. No evidence of mitral valve regurgitation. Mild mitral valve stenosis. MV peak gradient, 13.7 mmHg. The mean mitral  valve gradient is 6.5 mmHg. Tricuspid Valve: The tricuspid valve is normal in structure. Tricuspid valve regurgitation is not demonstrated. No evidence of tricuspid stenosis. Aortic Valve: The aortic valve is tricuspid. . There is moderate thickening and moderate calcification of the aortic valve. Aortic valve regurgitation is not visualized. Mild aortic stenosis is present. There is moderate thickening of the aortic valve. There is moderate calcification of the aortic valve. Aortic valve mean gradient measures 11.5 mmHg. Aortic valve peak gradient measures 26.1 mmHg. Aortic valve area, by VTI measures 1.03 cm. Pulmonic Valve: The pulmonic valve was normal in structure. Pulmonic valve regurgitation is not visualized. No evidence of pulmonic stenosis. Aorta: The aortic root is normal in size and structure. Venous: The inferior vena cava is normal in size with greater than 50% respiratory variability, suggesting right atrial pressure of 3 mmHg. IAS/Shunts: No atrial level shunt detected by color flow Doppler.  LEFT VENTRICLE PLAX 2D LVIDd:         3.60 cm  Diastology LVIDs:         2.60 cm  LV e' lateral:   4.28 cm/s LV PW:         1.30 cm  LV E/e' lateral: 32.0 LV IVS:        1.20 cm  LV e' medial:    2.48 cm/s LVOT diam:     1.70 cm  LV E/e' medial:  55.2 LV SV:         45 LV SV Index:   22 LVOT Area:     2.27 cm  RIGHT VENTRICLE RV S prime:     15.30 cm/s TAPSE (M-mode): 2.5 cm LEFT ATRIUM              Index       RIGHT ATRIUM           Index LA diam:        3.50 cm  1.73 cm/m  RA Area:     13.70 cm LA Vol (A2C):   109.0 ml 53.94 ml/m RA Volume:   30.70 ml  15.19 ml/m LA Vol (A4C):   87.8  ml  43.45 ml/m LA Biplane Vol: 97.6 ml  48.30 ml/m  AORTIC VALVE AV Area (Vmax):    1.07 cm AV Area (Vmean):   1.20 cm AV Area (VTI):     1.03 cm AV Vmax:           255.50 cm/s AV Vmean:          149.500 cm/s AV VTI:            0.437 m AV Peak Grad:      26.1 mmHg AV Mean Grad:      11.5 mmHg LVOT Vmax:         121.00 cm/s LVOT Vmean:        78.800 cm/s LVOT VTI:          0.199 m LVOT/AV VTI ratio: 0.46  AORTA Ao Root diam: 2.90 cm MITRAL VALVE MV Area (PHT): 3.22 cm     SHUNTS MV Peak grad:  13.7 mmHg    Systemic VTI:  0.20 m MV Mean grad:  6.5 mmHg     Systemic Diam: 1.70 cm MV Vmax:       1.85 m/s MV Vmean:      120.0 cm/s MV Decel Time: 235 msec MV E velocity: 137.00 cm/s MV A velocity: 159.00 cm/s MV E/A ratio:  0.86 Candee Furbish MD Electronically signed by  Candee Furbish MD Signature Date/Time: 08/11/2019/11:08:47 AM    Final        Discharge Exam: Vitals:   08/11/19 2148 08/12/19 0850  BP: (!) 147/56 (!) 158/51  Pulse: 68 77  Resp: 18 18  Temp: 98 F (36.7 C) 98 F (36.7 C)  SpO2: 95% 95%    General: Pt is alert, awake, not in acute distress Cardiovascular: RRR, S1/S2 +, no edema Respiratory: CTA bilaterally, no wheezing, no rhonchi, no respiratory distress, no conversational dyspnea  Abdominal: Soft, NT, ND, bowel sounds + Extremities: no edema, no cyanosis Psych: Normal mood and affect, stable judgement and insight     The results of significant diagnostics from this hospitalization (including imaging, microbiology, ancillary and laboratory) are listed below for reference.     Microbiology: Recent Results (from the past 240 hour(s))  Respiratory Panel by RT PCR (Flu A&B, Covid) - Nasopharyngeal Swab     Status: None   Collection Time: 08/08/19  7:02 PM   Specimen: Nasopharyngeal Swab  Result Value Ref Range Status   SARS Coronavirus 2 by RT PCR NEGATIVE NEGATIVE Final    Comment: (NOTE) SARS-CoV-2 target nucleic acids are NOT DETECTED. The SARS-CoV-2 RNA is generally  detectable in upper respiratoy specimens during the acute phase of infection. The lowest concentration of SARS-CoV-2 viral copies this assay can detect is 131 copies/mL. A negative result does not preclude SARS-Cov-2 infection and should not be used as the sole basis for treatment or other patient management decisions. A negative result may occur with  improper specimen collection/handling, submission of specimen other than nasopharyngeal swab, presence of viral mutation(s) within the areas targeted by this assay, and inadequate number of viral copies (<131 copies/mL). A negative result must be combined with clinical observations, patient history, and epidemiological information. The expected result is Negative. Fact Sheet for Patients:  PinkCheek.be Fact Sheet for Healthcare Providers:  GravelBags.it This test is not yet ap proved or cleared by the [place] FDA and  has been authorized for detection and/or diagnosis of SARS-CoV-2 by FDA under an Emergency Use Authorization (EUA). This EUA will remain  in effect (meaning this test can be used) for the duration of the COVID-19 declaration under Section 564(b)(1) of the Act, 21 U.S.C. section 360bbb-3(b)(1), unless the authorization is terminated or revoked sooner.    Influenza A by PCR NEGATIVE NEGATIVE Final   Influenza B by PCR NEGATIVE NEGATIVE Final    Comment: (NOTE) The Xpert Xpress SARS-CoV-2/FLU/RSV assay is intended as an aid in  the diagnosis of influenza from Nasopharyngeal swab specimens and  should not be used as a sole basis for treatment. Nasal washings and  aspirates are unacceptable for Xpert Xpress SARS-CoV-2/FLU/RSV  testing. Fact Sheet for Patients: PinkCheek.be Fact Sheet for Healthcare Providers: GravelBags.it This test is not yet approved or cleared by the [place] FDA and  has been  authorized for detection and/or diagnosis of SARS-CoV-2 by  FDA under an Emergency Use Authorization (EUA). This EUA will remain  in effect (meaning this test can be used) for the duration of the  Covid-19 declaration under Section 564(b)(1) of the Act, 21  U.S.C. section 360bbb-3(b)(1), unless the authorization is  terminated or revoked. Performed at [facility], [address], [place], [place] [address]   Respiratory Panel by RT PCR (Flu A&B, Covid) - Nasopharyngeal Swab     Status: None   Collection Time: 08/10/19  5:06 PM   Specimen: Nasopharyngeal Swab  Result Value Ref Range Status  SARS Coronavirus 2 by RT PCR NEGATIVE NEGATIVE Final    Comment: (NOTE) SARS-CoV-2 target nucleic acids are NOT DETECTED. The SARS-CoV-2 RNA is generally detectable in upper respiratoy specimens during the acute phase of infection. The lowest concentration of SARS-CoV-2 viral copies this assay can detect is 131 copies/mL. A negative result does not preclude SARS-Cov-2 infection and should not be used as the sole basis for treatment or other patient management decisions. A negative result may occur with  improper specimen collection/handling, submission of specimen other than nasopharyngeal swab, presence of viral mutation(s) within the areas targeted by this assay, and inadequate number of viral copies (<131 copies/mL). A negative result must be combined with clinical observations, patient history, and epidemiological information. The expected result is Negative. Fact Sheet for Patients:  PinkCheek.be Fact Sheet for Healthcare Providers:  GravelBags.it This test is not yet ap proved or cleared by the [place] FDA and  has been authorized for detection and/or diagnosis of SARS-CoV-2 by FDA under an Emergency Use Authorization (EUA). This EUA will remain  in effect (meaning this test can be used) for the duration of the COVID-19  declaration under Section 564(b)(1) of the Act, 21 U.S.C. section 360bbb-3(b)(1), unless the authorization is terminated or revoked sooner.    Influenza A by PCR NEGATIVE NEGATIVE Final   Influenza B by PCR NEGATIVE NEGATIVE Final    Comment: (NOTE) The Xpert Xpress SARS-CoV-2/FLU/RSV assay is intended as an aid in  the diagnosis of influenza from Nasopharyngeal swab specimens and  should not be used as a sole basis for treatment. Nasal washings and  aspirates are unacceptable for Xpert Xpress SARS-CoV-2/FLU/RSV  testing. Fact Sheet for Patients: PinkCheek.be Fact Sheet for Healthcare Providers: GravelBags.it This test is not yet approved or cleared by the [place] FDA and  has been authorized for detection and/or diagnosis of SARS-CoV-2 by  FDA under an Emergency Use Authorization (EUA). This EUA will remain  in effect (meaning this test can be used) for the duration of the  Covid-19 declaration under Section 564(b)(1) of the Act, 21  U.S.C. section 360bbb-3(b)(1), unless the authorization is  terminated or revoked. Performed at [facility], [place] [address] [place], [place] [address]      Labs: BNP (last 3 results) Recent Labs    08/08/19 1559 08/10/19 1435  BNP 103.0* [id number]*   Basic Metabolic Panel: Recent Labs  Lab 08/08/19 1559 08/09/19 0508 08/10/19 1230 08/11/19 0313 08/12/19 0253  NA 137 140 140 141 141  K 4.4 4.4 4.9 4.0 4.3  CL 99 99 95* 97* 96*  CO2 33* 33* 33* 36* 37*  GLUCOSE 124* 215* 185* 121* 96  BUN 18 23 25* 17 23  CREATININE 0.85 1.03* 1.39* 1.00 1.16*  CALCIUM 8.9 8.9 9.1 8.8* 8.8*   Liver Function Tests: Recent Labs  Lab 08/09/19 0508 08/10/19 1418 08/10/19 1435  AST 23 30 30   ALT 20 26 25   ALKPHOS 76 73 72  BILITOT 0.8 0.9 0.6  PROT 5.9* 6.6 6.4*  ALBUMIN 2.9* 3.2* 3.2*   No results for input(s): LIPASE, AMYLASE in the last 168 hours. No results for input(s):  AMMONIA in the last 168 hours. CBC: Recent Labs  Lab 08/08/19 1559 08/09/19 0508 08/10/19 1230 08/11/19 0313  WBC 7.9 5.6 9.9 8.3  NEUTROABS 5.7  --   --   --   HGB 9.2* 8.6* 9.5* 8.8*  HCT 30.7* 29.0* 31.6* 28.2*  MCV 103.4* 101.0* 101.9* 98.3  PLT 172  176 168 176   Cardiac Enzymes: No results for input(s): CKTOTAL, CKMB, CKMBINDEX, TROPONINI in the last 168 hours. BNP: Invalid input(s): POCBNP CBG: Recent Labs  Lab 08/11/19 2149 08/12/19 0848  GLUCAP 179* 111*   D-Dimer No results for input(s): DDIMER in the last 72 hours. Hgb A1c Recent Labs    08/11/19 0313  HGBA1C 5.9*   Lipid Profile No results for input(s): CHOL, HDL, LDLCALC, TRIG, CHOLHDL, LDLDIRECT in the last 72 hours. Thyroid function studies No results for input(s): TSH, T4TOTAL, T3FREE, THYROIDAB in the last 72 hours.  Invalid input(s): FREET3 Anemia work up No results for input(s): VITAMINB12, FOLATE, FERRITIN, TIBC, IRON, RETICCTPCT in the last 72 hours. Urinalysis    Component Value Date/Time   COLORURINE YELLOW 03/21/2019 0401   APPEARANCEUR CLEAR 03/21/2019 0401   LABSPEC 1.018 03/21/2019 0401   PHURINE 6.0 03/21/2019 0401   GLUCOSEU NEGATIVE 03/21/2019 0401   HGBUR NEGATIVE 03/21/2019 0401   BILIRUBINUR NEGATIVE 03/21/2019 0401   KETONESUR NEGATIVE 03/21/2019 0401   PROTEINUR NEGATIVE 03/21/2019 0401   UROBILINOGEN 0.2 12/28/2009 0622   NITRITE NEGATIVE 03/21/2019 0401   LEUKOCYTESUR TRACE (A) 03/21/2019 0401   Sepsis Labs Invalid input(s): PROCALCITONIN,  WBC,  LACTICIDVEN Microbiology Recent Results (from the past 240 hour(s))  Respiratory Panel by RT PCR (Flu A&B, Covid) - Nasopharyngeal Swab     Status: None   Collection Time: 08/08/19  7:02 PM   Specimen: Nasopharyngeal Swab  Result Value Ref Range Status   SARS Coronavirus 2 by RT PCR NEGATIVE NEGATIVE Final    Comment: (NOTE) SARS-CoV-2 target nucleic acids are NOT DETECTED. The SARS-CoV-2 RNA is generally detectable in  upper respiratoy specimens during the acute phase of infection. The lowest concentration of SARS-CoV-2 viral copies this assay can detect is 131 copies/mL. A negative result does not preclude SARS-Cov-2 infection and should not be used as the sole basis for treatment or other patient management decisions. A negative result may occur with  improper specimen collection/handling, submission of specimen other than nasopharyngeal swab, presence of viral mutation(s) within the areas targeted by this assay, and inadequate number of viral copies (<131 copies/mL). A negative result must be combined with clinical observations, patient history, and epidemiological information. The expected result is Negative. Fact Sheet for Patients:  PinkCheek.be Fact Sheet for Healthcare Providers:  GravelBags.it This test is not yet ap proved or cleared by the [place] FDA and  has been authorized for detection and/or diagnosis of SARS-CoV-2 by FDA under an Emergency Use Authorization (EUA). This EUA will remain  in effect (meaning this test can be used) for the duration of the COVID-19 declaration under Section 564(b)(1) of the Act, 21 U.S.C. section 360bbb-3(b)(1), unless the authorization is terminated or revoked sooner.    Influenza A by PCR NEGATIVE NEGATIVE Final   Influenza B by PCR NEGATIVE NEGATIVE Final    Comment: (NOTE) The Xpert Xpress SARS-CoV-2/FLU/RSV assay is intended as an aid in  the diagnosis of influenza from Nasopharyngeal swab specimens and  should not be used as a sole basis for treatment. Nasal washings and  aspirates are unacceptable for Xpert Xpress SARS-CoV-2/FLU/RSV  testing. Fact Sheet for Patients: PinkCheek.be Fact Sheet for Healthcare Providers: GravelBags.it This test is not yet approved or cleared by the [place] FDA and  has been authorized for  detection and/or diagnosis of SARS-CoV-2 by  FDA under an Emergency Use Authorization (EUA). This EUA will remain  in effect (meaning this test can be used) for  the duration of the  Covid-19 declaration under Section 564(b)(1) of the Act, 21  U.S.C. section 360bbb-3(b)(1), unless the authorization is  terminated or revoked. Performed at [facility], [address], [place], [place] [address]   Respiratory Panel by RT PCR (Flu A&B, Covid) - Nasopharyngeal Swab     Status: None   Collection Time: 08/10/19  5:06 PM   Specimen: Nasopharyngeal Swab  Result Value Ref Range Status   SARS Coronavirus 2 by RT PCR NEGATIVE NEGATIVE Final    Comment: (NOTE) SARS-CoV-2 target nucleic acids are NOT DETECTED. The SARS-CoV-2 RNA is generally detectable in upper respiratoy specimens during the acute phase of infection. The lowest concentration of SARS-CoV-2 viral copies this assay can detect is 131 copies/mL. A negative result does not preclude SARS-Cov-2 infection and should not be used as the sole basis for treatment or other patient management decisions. A negative result may occur with  improper specimen collection/handling, submission of specimen other than nasopharyngeal swab, presence of viral mutation(s) within the areas targeted by this assay, and inadequate number of viral copies (<131 copies/mL). A negative result must be combined with clinical observations, patient history, and epidemiological information. The expected result is Negative. Fact Sheet for Patients:  PinkCheek.be Fact Sheet for Healthcare Providers:  GravelBags.it This test is not yet ap proved or cleared by the [place] FDA and  has been authorized for detection and/or diagnosis of SARS-CoV-2 by FDA under an Emergency Use Authorization (EUA). This EUA will remain  in effect (meaning this test can be used) for the duration of the COVID-19 declaration under  Section 564(b)(1) of the Act, 21 U.S.C. section 360bbb-3(b)(1), unless the authorization is terminated or revoked sooner.    Influenza A by PCR NEGATIVE NEGATIVE Final   Influenza B by PCR NEGATIVE NEGATIVE Final    Comment: (NOTE) The Xpert Xpress SARS-CoV-2/FLU/RSV assay is intended as an aid in  the diagnosis of influenza from Nasopharyngeal swab specimens and  should not be used as a sole basis for treatment. Nasal washings and  aspirates are unacceptable for Xpert Xpress SARS-CoV-2/FLU/RSV  testing. Fact Sheet for Patients: PinkCheek.be Fact Sheet for Healthcare Providers: GravelBags.it This test is not yet approved or cleared by the [place] FDA and  has been authorized for detection and/or diagnosis of SARS-CoV-2 by  FDA under an Emergency Use Authorization (EUA). This EUA will remain  in effect (meaning this test can be used) for the duration of the  Covid-19 declaration under Section 564(b)(1) of the Act, 21  U.S.C. section 360bbb-3(b)(1), unless the authorization is  terminated or revoked. Performed at [facility], [place] [address] [place], [place] [address]      Patient was seen and examined on the day of discharge and was found to be in stable condition. Time coordinating discharge: 25 minutes including assessment and coordination of care, as well as examination of the patient.   SIGNED:  Dessa Phi, DO [facility] 08/12/2019, 9:12 AM

## 2019-08-14 DIAGNOSIS — Z9181 History of falling: Secondary | ICD-10-CM | POA: Diagnosis not present

## 2019-08-14 DIAGNOSIS — I130 Hypertensive heart and chronic kidney disease with heart failure and stage 1 through stage 4 chronic kidney disease, or unspecified chronic kidney disease: Secondary | ICD-10-CM | POA: Diagnosis not present

## 2019-08-14 DIAGNOSIS — E039 Hypothyroidism, unspecified: Secondary | ICD-10-CM | POA: Diagnosis not present

## 2019-08-14 DIAGNOSIS — N1832 Chronic kidney disease, stage 3b: Secondary | ICD-10-CM | POA: Diagnosis not present

## 2019-08-14 DIAGNOSIS — E785 Hyperlipidemia, unspecified: Secondary | ICD-10-CM | POA: Diagnosis not present

## 2019-08-14 DIAGNOSIS — E1122 Type 2 diabetes mellitus with diabetic chronic kidney disease: Secondary | ICD-10-CM | POA: Diagnosis not present

## 2019-08-14 DIAGNOSIS — I5032 Chronic diastolic (congestive) heart failure: Secondary | ICD-10-CM | POA: Diagnosis not present

## 2019-08-14 DIAGNOSIS — Z9981 Dependence on supplemental oxygen: Secondary | ICD-10-CM | POA: Diagnosis not present

## 2019-08-14 DIAGNOSIS — J9611 Chronic respiratory failure with hypoxia: Secondary | ICD-10-CM | POA: Diagnosis not present

## 2019-08-14 DIAGNOSIS — R32 Unspecified urinary incontinence: Secondary | ICD-10-CM | POA: Diagnosis not present

## 2019-08-14 DIAGNOSIS — Z7951 Long term (current) use of inhaled steroids: Secondary | ICD-10-CM | POA: Diagnosis not present

## 2019-08-14 DIAGNOSIS — K219 Gastro-esophageal reflux disease without esophagitis: Secondary | ICD-10-CM | POA: Diagnosis not present

## 2019-08-14 DIAGNOSIS — J441 Chronic obstructive pulmonary disease with (acute) exacerbation: Secondary | ICD-10-CM | POA: Diagnosis not present

## 2019-08-14 DIAGNOSIS — Z96611 Presence of right artificial shoulder joint: Secondary | ICD-10-CM | POA: Diagnosis not present

## 2019-08-14 DIAGNOSIS — Z86010 Personal history of colonic polyps: Secondary | ICD-10-CM | POA: Diagnosis not present

## 2019-08-14 DIAGNOSIS — M1990 Unspecified osteoarthritis, unspecified site: Secondary | ICD-10-CM | POA: Diagnosis not present

## 2019-08-18 ENCOUNTER — Other Ambulatory Visit: Payer: Self-pay

## 2019-08-18 ENCOUNTER — Ambulatory Visit: Payer: PPO | Admitting: Cardiovascular Disease

## 2019-08-18 ENCOUNTER — Encounter: Payer: Self-pay | Admitting: Cardiovascular Disease

## 2019-08-18 VITALS — BP 138/50 | HR 80 | Ht 66.0 in | Wt 203.0 lb

## 2019-08-18 DIAGNOSIS — I6529 Occlusion and stenosis of unspecified carotid artery: Secondary | ICD-10-CM | POA: Diagnosis not present

## 2019-08-18 DIAGNOSIS — I6523 Occlusion and stenosis of bilateral carotid arteries: Secondary | ICD-10-CM

## 2019-08-18 DIAGNOSIS — E785 Hyperlipidemia, unspecified: Secondary | ICD-10-CM | POA: Diagnosis not present

## 2019-08-18 DIAGNOSIS — I110 Hypertensive heart disease with heart failure: Secondary | ICD-10-CM

## 2019-08-18 DIAGNOSIS — I5032 Chronic diastolic (congestive) heart failure: Secondary | ICD-10-CM | POA: Diagnosis not present

## 2019-08-18 DIAGNOSIS — R0989 Other specified symptoms and signs involving the circulatory and respiratory systems: Secondary | ICD-10-CM | POA: Diagnosis not present

## 2019-08-18 DIAGNOSIS — I10 Essential (primary) hypertension: Secondary | ICD-10-CM | POA: Diagnosis not present

## 2019-08-18 DIAGNOSIS — I5033 Acute on chronic diastolic (congestive) heart failure: Secondary | ICD-10-CM

## 2019-08-18 NOTE — Progress Notes (Signed)
Sandra Cuevas Date of Birth  02/11/1929       [facility]    [employer] [address], [place], [place]  [address]   [place], [place]  [address] [phone]     [phone]   Fax  [phone]    Fax [phone]  Problem List: 1. Chest pain-normal stress Myoview study 2.  Chronic diastolic CHF - grade 2 DD  3. Hypertension 4. Diabetes Mellitus 5. Carotid bruit  IMPRESSION: Less than 50% stenosis in the right internal carotid artery with some plaque.  Stable 50-69% stenosis in the left internal carotid artery with significant calcified plaque.  Increasing velocity in the left external carotid artery compatible with significant narrowing.  6. RBBB  History of Present Illness: Sandra Cuevas is an 86 year old female with a history of atypical chest pains. She had a negative stress Myoview study in March of 2012. She has a history of asthma and uses albuterol on an as-needed basis. She continues to have shortness of breath with exertion. He also has a history of hypertension.  She complains of chronic dyspnea.   She also has lots of back pain.  Dec. 16, 2014:  Sandra Cuevas is doing well from a cardiac standpoint. She's having lots of noncardiac problems. She's having problems with her bowels. She has lots of neck pain. She had an MRI performed several weeks ago which reveals significant cervical arthritis and disc disease. Her blood pressure has been normal.  She also has lots of asthma. She has used her inhalers on a regular basis.  Dec. 17, 2015:  Sandra Cuevas is a 86 yo with hx of noncardiac CP,  HTN, carotid bruit, and DM.  She may have some sinus isues, hoarsness and scratchy throat.   Jan. 10, 2017:  Sandra Cuevas doing well. Has occasional intrascapular pain 3 weeks ago.   Lasted about 2 weeks.  Was worse with movement . Has resolved how.   Jan. 23, 2018:  No cardiac issues.   Seen with daughter, Sandra Cuevas .  Has  back and hip issues.   No CP or dyspnea.   Does her normal activities. Does have occasional dyspnea and takes a Lasix to resolve this  Takes Lasix every 2-3 weeks at the most.   Has grade 2 diastolic dysfunction .   June 16, 2017:  Sandra Cuevas is seen back today for follow-up of her chronic diastolic congestive heart failure, hypertension.  She is been seen in the emergency room on several occasions for chest pain.  She had the flu this past winter .    June 25, 2018:  Overall she is ding welll Seen for follow up of chronic diastoic CHF, HTN  No CP no dyspnea No cough or fever .   Aug 18, 2019: Sandra Cuevas is a 86 year old female with a history of chronic diastolic congestive heart failure, hypertension, hyperlipidemia. Has DOE with walking Has been on home O2 since that time  Gets DOE walking as little as walking to the bathroom   She was hospitalized from May 4 May 6 with shortness of breath. Chest angiogram revealed no evidence of pulmonary embolus.  She did have small bilateral pleural effusions.  She was diuresed with IV Lasix and transitioned to p.o. Lasix. Hydralazine was stopped .   Was eating more salt prior to her hospitalization  .  Ate several pieces of salted fat back a day for several days which lead to  her hospitalization .     Current Outpatient Medications on File Prior to Visit  Medication Sig Dispense Refill  . acetaminophen (TYLENOL) 500 MG tablet Take 2 tablets (1,000 mg total) by mouth every 6 (six) hours as needed. 30 tablet 0  . albuterol (PROVENTIL) (5 MG/ML) 0.5% nebulizer solution Take 0.5 mLs (2.5 mg total) by nebulization every 4 (four) hours as needed for wheezing or shortness of breath. 20 mL 12  . amLODipine (NORVASC) 10 MG tablet Take 1 tablet (10 mg total) by mouth daily. 30 tablet 0  . atorvastatin (LIPITOR) 40 MG tablet TAKE 1 TABLET(40 MG) BY MOUTH DAILY AT 6 PM 90 tablet 2  . fluticasone (FLONASE) 50 MCG/ACT nasal spray SHAKE LIQUID AND USE  2 SPRAYS IN EACH NOSTRIL DAILY 16 g 5  . furosemide (LASIX) 20 MG tablet Take 1 tablet (20 mg total) by mouth daily. 30 tablet 0  . gabapentin (NEURONTIN) 600 MG tablet Take 600 mg by mouth 3 (three) times daily.    Marland Kitchen levothyroxine (SYNTHROID, LEVOTHROID) 50 MCG tablet Take 50 mcg by mouth daily.      . Multiple Vitamins-Minerals (PRESERVISION AREDS 2+MULTI VIT PO) Take 1 tablet by mouth daily.    . Polyvinyl Alcohol-Povidone (MURINE TEARS FOR DRY EYES) 5-6 MG/ML SOLN Place 1 drop into both eyes daily as needed (Dry Eyes).    . potassium chloride (MICRO-K) 10 MEQ CR capsule Take 10 mEq by mouth daily.     . traMADol (ULTRAM) 50 MG tablet Take 50 mg by mouth every 4 (four) hours as needed for pain.    Marland Kitchen zolpidem (AMBIEN) 10 MG tablet Take 5 mg by mouth at bedtime as needed for sleep.      No current facility-administered medications on file prior to visit.    No Known Allergies  Past Medical History:  Diagnosis Date  . Arthritis   . Asthma   . Atypical chest pain   . 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 COPD (chronic obstructive pulmonary disease) ([place])   . Diabetes mellitus   . GERD (gastroesophageal reflux disease)   . History of cardiovascular stress test 06/01/2010   EF 71% - no evidence of ischemia, normal left ventricular systolic function  . History of hysterectomy 1965  . Hyperlipidemia   . Hypertension   . Hypothyroidism   . Shortness of breath    on exertion  . Tubular adenoma of colon     Past Surgical History:  Procedure Laterality Date  . ABDOMINAL HYSTERECTOMY    . CARDIAC SURGERY     catherization  . HEMORRHOID SURGERY    . TOTAL SHOULDER REPLACEMENT Right     Social History   Tobacco Use  Smoking Status Never Smoker  Smokeless Tobacco Never Used    Social History   Substance and Sexual Activity  Alcohol Use No    Family History  Problem Relation Age of Onset  . Heart disease Father        heart problems  . Kidney failure  Mother   . Diabetes Mother   . Leukemia Brother   . Diabetes Brother   . Colon cancer Sister   . Leukemia Brother   . Diabetes Brother   . Pancreatic cancer Brother   . Bone cancer Sister     Reviw of Systems:     Physical Exam: Blood pressure (!) 138/50, pulse 80, height 5\' 6"  (1.676 m), weight 203 lb (92.1  kg), SpO2 97 %.  GEN:  Elderly female  HEENT: Normal NECK: No JVD;  bilat cartoid bruits LYMPHATICS: No lymphadenopathy CARDIAC: RRR , no murmurs, rubs, gallops RESPIRATORY:  Clear to auscultation without rales, wheezing or rhonchi  ABDOMEN: Soft, non-tender, non-distended MUSCULOSKELETAL:  No edema; No deformity  SKIN: Warm and dry NEUROLOGIC:  Alert and oriented x 3    ECG:        Assessment / Plan:        1.  . Acute on chronic diastolic CHF -Sandra Cuevas presents for follow-up evaluation following hospitalization for acute on chronic diastolic congestive heart failure.  She has been eating a fair amount of extra salt.  She went up with heart failure.  She was admitted received IV diuretics.  She is started on Lasix on a daily basis.  She is feeling quite a bit better.  I have cautioned her about eating excessive salt.  Continue Lasix.  She will follow-up with an APP in 6 months.  3. Hypertension -   blood pressures well controlled.  We will continue to hold hydralazine.  4. Diabetes Mellitus -     5. Carotid bruit -    she has  bilateral carotid bruits.  Carotid duplex scan reveals moderate carotid artery disease.  We will repeat around August, 2021.  6.  Hyperlipidemia :   Recheck labs in 6 months    Mertie Moores, MD  08/18/2019 3:29 PM    [facility],  [place], [place]  [address] Pager [phone] Phone: [phone]; Fax: [phone]

## 2019-08-18 NOTE — Patient Instructions (Signed)
Medication Instructions:  Your physician recommends that you continue on your current medications as directed. Please refer to the Current Medication list given to you today.  *If you need a refill on your cardiac medications before your next appointment, please call your pharmacy*   Lab Work: Your physician recommends that you return for lab work in: 6 months on the day of or a few days before your office visit. You will need to FAST for this appointment - nothing to eat or drink after midnight the night before except water.  If you have labs (blood work) drawn today and your tests are completely normal, you will receive your results only by: Marland Kitchen MyChart Message (if you have MyChart) OR . A paper copy in the mail If you have any lab test that is abnormal or we need to change your treatment, we will call you to review the results.   Testing/Procedures: Your physician has requested that you have a carotid duplex in August 2021. This test is an ultrasound of the carotid arteries in your neck. It looks at blood flow through these arteries that supply the brain with blood. Allow one hour for this exam. There are no restrictions or special instructions.     Follow-Up: At [facility], you and your health needs are our priority.  As part of our continuing mission to provide you with exceptional heart care, we have created designated Provider Care Teams.  These Care Teams include your primary Cardiologist (physician) and Advanced Practice Providers (APPs -  Physician Assistants and Nurse Practitioners) who all work together to provide you with the care you need, when you need it.  We recommend signing up for the patient portal called "MyChart".  Sign up information is provided on this After Visit Summary.  MyChart is used to connect with patients for Virtual Visits (Telemedicine).  Patients are able to view lab/test results, encounter notes, upcoming appointments, etc.  Non-urgent messages can be  sent to your provider as well.   To learn more about what you can do with MyChart, go to [email or web address].    Your next appointment:   6 month(s)  The format for your next appointment:   Either In Person or Virtual  Provider:   Richardson Dopp, PA-C or Robbie Lis, PA-C

## 2019-08-22 DIAGNOSIS — J449 Chronic obstructive pulmonary disease, unspecified: Secondary | ICD-10-CM | POA: Diagnosis not present

## 2019-08-23 DIAGNOSIS — Z7951 Long term (current) use of inhaled steroids: Secondary | ICD-10-CM | POA: Diagnosis not present

## 2019-08-23 DIAGNOSIS — E039 Hypothyroidism, unspecified: Secondary | ICD-10-CM | POA: Diagnosis not present

## 2019-08-23 DIAGNOSIS — R32 Unspecified urinary incontinence: Secondary | ICD-10-CM | POA: Diagnosis not present

## 2019-08-23 DIAGNOSIS — J9611 Chronic respiratory failure with hypoxia: Secondary | ICD-10-CM | POA: Diagnosis not present

## 2019-08-23 DIAGNOSIS — E785 Hyperlipidemia, unspecified: Secondary | ICD-10-CM | POA: Diagnosis not present

## 2019-08-23 DIAGNOSIS — Z6833 Body mass index (BMI) 33.0-33.9, adult: Secondary | ICD-10-CM | POA: Diagnosis not present

## 2019-08-23 DIAGNOSIS — Z96611 Presence of right artificial shoulder joint: Secondary | ICD-10-CM | POA: Diagnosis not present

## 2019-08-23 DIAGNOSIS — I5030 Unspecified diastolic (congestive) heart failure: Secondary | ICD-10-CM | POA: Diagnosis not present

## 2019-08-23 DIAGNOSIS — E1122 Type 2 diabetes mellitus with diabetic chronic kidney disease: Secondary | ICD-10-CM | POA: Diagnosis not present

## 2019-08-23 DIAGNOSIS — Z86010 Personal history of colonic polyps: Secondary | ICD-10-CM | POA: Diagnosis not present

## 2019-08-23 DIAGNOSIS — J441 Chronic obstructive pulmonary disease with (acute) exacerbation: Secondary | ICD-10-CM | POA: Diagnosis not present

## 2019-08-23 DIAGNOSIS — R0902 Hypoxemia: Secondary | ICD-10-CM | POA: Diagnosis not present

## 2019-08-23 DIAGNOSIS — K219 Gastro-esophageal reflux disease without esophagitis: Secondary | ICD-10-CM | POA: Diagnosis not present

## 2019-08-23 DIAGNOSIS — Z9181 History of falling: Secondary | ICD-10-CM | POA: Diagnosis not present

## 2019-08-23 DIAGNOSIS — Z9981 Dependence on supplemental oxygen: Secondary | ICD-10-CM | POA: Diagnosis not present

## 2019-08-23 DIAGNOSIS — I130 Hypertensive heart and chronic kidney disease with heart failure and stage 1 through stage 4 chronic kidney disease, or unspecified chronic kidney disease: Secondary | ICD-10-CM | POA: Diagnosis not present

## 2019-08-23 DIAGNOSIS — N1832 Chronic kidney disease, stage 3b: Secondary | ICD-10-CM | POA: Diagnosis not present

## 2019-08-23 DIAGNOSIS — J45909 Unspecified asthma, uncomplicated: Secondary | ICD-10-CM | POA: Diagnosis not present

## 2019-08-23 DIAGNOSIS — I5032 Chronic diastolic (congestive) heart failure: Secondary | ICD-10-CM | POA: Diagnosis not present

## 2019-08-23 DIAGNOSIS — M1990 Unspecified osteoarthritis, unspecified site: Secondary | ICD-10-CM | POA: Diagnosis not present

## 2019-08-26 ENCOUNTER — Ambulatory Visit (HOSPITAL_COMMUNITY)
Admission: RE | Admit: 2019-08-26 | Discharge: 2019-08-26 | Disposition: A | Discharge status: Home / Self Care | Payer: PPO | Source: Ambulatory Visit | Attending: Family Medicine | Admitting: Family Medicine

## 2019-08-26 ENCOUNTER — Other Ambulatory Visit: Payer: Self-pay

## 2019-08-26 DIAGNOSIS — Z1231 Encounter for screening mammogram for malignant neoplasm of breast: Secondary | ICD-10-CM | POA: Insufficient documentation

## 2019-08-30 DIAGNOSIS — I130 Hypertensive heart and chronic kidney disease with heart failure and stage 1 through stage 4 chronic kidney disease, or unspecified chronic kidney disease: Secondary | ICD-10-CM | POA: Diagnosis not present

## 2019-08-30 DIAGNOSIS — N1832 Chronic kidney disease, stage 3b: Secondary | ICD-10-CM | POA: Diagnosis not present

## 2019-08-30 DIAGNOSIS — Z9981 Dependence on supplemental oxygen: Secondary | ICD-10-CM | POA: Diagnosis not present

## 2019-08-30 DIAGNOSIS — I5032 Chronic diastolic (congestive) heart failure: Secondary | ICD-10-CM | POA: Diagnosis not present

## 2019-08-30 DIAGNOSIS — J441 Chronic obstructive pulmonary disease with (acute) exacerbation: Secondary | ICD-10-CM | POA: Diagnosis not present

## 2019-08-30 DIAGNOSIS — Z7951 Long term (current) use of inhaled steroids: Secondary | ICD-10-CM | POA: Diagnosis not present

## 2019-08-30 DIAGNOSIS — M1990 Unspecified osteoarthritis, unspecified site: Secondary | ICD-10-CM | POA: Diagnosis not present

## 2019-08-30 DIAGNOSIS — J9611 Chronic respiratory failure with hypoxia: Secondary | ICD-10-CM | POA: Diagnosis not present

## 2019-08-30 DIAGNOSIS — Z96611 Presence of right artificial shoulder joint: Secondary | ICD-10-CM | POA: Diagnosis not present

## 2019-08-30 DIAGNOSIS — E1122 Type 2 diabetes mellitus with diabetic chronic kidney disease: Secondary | ICD-10-CM | POA: Diagnosis not present

## 2019-08-30 DIAGNOSIS — E039 Hypothyroidism, unspecified: Secondary | ICD-10-CM | POA: Diagnosis not present

## 2019-08-30 DIAGNOSIS — E785 Hyperlipidemia, unspecified: Secondary | ICD-10-CM | POA: Diagnosis not present

## 2019-08-30 DIAGNOSIS — R32 Unspecified urinary incontinence: Secondary | ICD-10-CM | POA: Diagnosis not present

## 2019-08-30 DIAGNOSIS — Z9181 History of falling: Secondary | ICD-10-CM | POA: Diagnosis not present

## 2019-08-30 DIAGNOSIS — Z86010 Personal history of colonic polyps: Secondary | ICD-10-CM | POA: Diagnosis not present

## 2019-08-30 DIAGNOSIS — K219 Gastro-esophageal reflux disease without esophagitis: Secondary | ICD-10-CM | POA: Diagnosis not present

## 2019-09-01 DIAGNOSIS — J449 Chronic obstructive pulmonary disease, unspecified: Secondary | ICD-10-CM | POA: Diagnosis not present

## 2019-09-01 DIAGNOSIS — R0600 Dyspnea, unspecified: Secondary | ICD-10-CM | POA: Diagnosis not present

## 2019-09-02 DIAGNOSIS — E039 Hypothyroidism, unspecified: Secondary | ICD-10-CM | POA: Diagnosis not present

## 2019-09-02 DIAGNOSIS — Z86010 Personal history of colonic polyps: Secondary | ICD-10-CM | POA: Diagnosis not present

## 2019-09-02 DIAGNOSIS — I5032 Chronic diastolic (congestive) heart failure: Secondary | ICD-10-CM | POA: Diagnosis not present

## 2019-09-02 DIAGNOSIS — E785 Hyperlipidemia, unspecified: Secondary | ICD-10-CM | POA: Diagnosis not present

## 2019-09-02 DIAGNOSIS — N1832 Chronic kidney disease, stage 3b: Secondary | ICD-10-CM | POA: Diagnosis not present

## 2019-09-02 DIAGNOSIS — M1990 Unspecified osteoarthritis, unspecified site: Secondary | ICD-10-CM | POA: Diagnosis not present

## 2019-09-02 DIAGNOSIS — Z9981 Dependence on supplemental oxygen: Secondary | ICD-10-CM | POA: Diagnosis not present

## 2019-09-02 DIAGNOSIS — J9611 Chronic respiratory failure with hypoxia: Secondary | ICD-10-CM | POA: Diagnosis not present

## 2019-09-02 DIAGNOSIS — R32 Unspecified urinary incontinence: Secondary | ICD-10-CM | POA: Diagnosis not present

## 2019-09-02 DIAGNOSIS — I130 Hypertensive heart and chronic kidney disease with heart failure and stage 1 through stage 4 chronic kidney disease, or unspecified chronic kidney disease: Secondary | ICD-10-CM | POA: Diagnosis not present

## 2019-09-02 DIAGNOSIS — K219 Gastro-esophageal reflux disease without esophagitis: Secondary | ICD-10-CM | POA: Diagnosis not present

## 2019-09-02 DIAGNOSIS — Z7951 Long term (current) use of inhaled steroids: Secondary | ICD-10-CM | POA: Diagnosis not present

## 2019-09-02 DIAGNOSIS — J441 Chronic obstructive pulmonary disease with (acute) exacerbation: Secondary | ICD-10-CM | POA: Diagnosis not present

## 2019-09-02 DIAGNOSIS — E1122 Type 2 diabetes mellitus with diabetic chronic kidney disease: Secondary | ICD-10-CM | POA: Diagnosis not present

## 2019-09-02 DIAGNOSIS — Z9181 History of falling: Secondary | ICD-10-CM | POA: Diagnosis not present

## 2019-09-02 DIAGNOSIS — Z96611 Presence of right artificial shoulder joint: Secondary | ICD-10-CM | POA: Diagnosis not present

## 2019-09-08 DIAGNOSIS — R32 Unspecified urinary incontinence: Secondary | ICD-10-CM | POA: Diagnosis not present

## 2019-09-08 DIAGNOSIS — I130 Hypertensive heart and chronic kidney disease with heart failure and stage 1 through stage 4 chronic kidney disease, or unspecified chronic kidney disease: Secondary | ICD-10-CM | POA: Diagnosis not present

## 2019-09-08 DIAGNOSIS — I5032 Chronic diastolic (congestive) heart failure: Secondary | ICD-10-CM | POA: Diagnosis not present

## 2019-09-08 DIAGNOSIS — E1122 Type 2 diabetes mellitus with diabetic chronic kidney disease: Secondary | ICD-10-CM | POA: Diagnosis not present

## 2019-09-08 DIAGNOSIS — Z86010 Personal history of colonic polyps: Secondary | ICD-10-CM | POA: Diagnosis not present

## 2019-09-08 DIAGNOSIS — E785 Hyperlipidemia, unspecified: Secondary | ICD-10-CM | POA: Diagnosis not present

## 2019-09-08 DIAGNOSIS — E039 Hypothyroidism, unspecified: Secondary | ICD-10-CM | POA: Diagnosis not present

## 2019-09-08 DIAGNOSIS — M1990 Unspecified osteoarthritis, unspecified site: Secondary | ICD-10-CM | POA: Diagnosis not present

## 2019-09-08 DIAGNOSIS — Z9981 Dependence on supplemental oxygen: Secondary | ICD-10-CM | POA: Diagnosis not present

## 2019-09-08 DIAGNOSIS — Z96611 Presence of right artificial shoulder joint: Secondary | ICD-10-CM | POA: Diagnosis not present

## 2019-09-08 DIAGNOSIS — J441 Chronic obstructive pulmonary disease with (acute) exacerbation: Secondary | ICD-10-CM | POA: Diagnosis not present

## 2019-09-08 DIAGNOSIS — K219 Gastro-esophageal reflux disease without esophagitis: Secondary | ICD-10-CM | POA: Diagnosis not present

## 2019-09-08 DIAGNOSIS — J9611 Chronic respiratory failure with hypoxia: Secondary | ICD-10-CM | POA: Diagnosis not present

## 2019-09-08 DIAGNOSIS — Z9181 History of falling: Secondary | ICD-10-CM | POA: Diagnosis not present

## 2019-09-08 DIAGNOSIS — Z7951 Long term (current) use of inhaled steroids: Secondary | ICD-10-CM | POA: Diagnosis not present

## 2019-09-08 DIAGNOSIS — N1832 Chronic kidney disease, stage 3b: Secondary | ICD-10-CM | POA: Diagnosis not present

## 2019-09-23 DIAGNOSIS — K219 Gastro-esophageal reflux disease without esophagitis: Secondary | ICD-10-CM | POA: Diagnosis not present

## 2019-09-23 DIAGNOSIS — R32 Unspecified urinary incontinence: Secondary | ICD-10-CM | POA: Diagnosis not present

## 2019-09-23 DIAGNOSIS — Z86010 Personal history of colonic polyps: Secondary | ICD-10-CM | POA: Diagnosis not present

## 2019-09-23 DIAGNOSIS — Z96611 Presence of right artificial shoulder joint: Secondary | ICD-10-CM | POA: Diagnosis not present

## 2019-09-23 DIAGNOSIS — J441 Chronic obstructive pulmonary disease with (acute) exacerbation: Secondary | ICD-10-CM | POA: Diagnosis not present

## 2019-09-23 DIAGNOSIS — N1832 Chronic kidney disease, stage 3b: Secondary | ICD-10-CM | POA: Diagnosis not present

## 2019-09-23 DIAGNOSIS — J9611 Chronic respiratory failure with hypoxia: Secondary | ICD-10-CM | POA: Diagnosis not present

## 2019-09-23 DIAGNOSIS — I5032 Chronic diastolic (congestive) heart failure: Secondary | ICD-10-CM | POA: Diagnosis not present

## 2019-09-23 DIAGNOSIS — E1122 Type 2 diabetes mellitus with diabetic chronic kidney disease: Secondary | ICD-10-CM | POA: Diagnosis not present

## 2019-09-23 DIAGNOSIS — Z9981 Dependence on supplemental oxygen: Secondary | ICD-10-CM | POA: Diagnosis not present

## 2019-09-23 DIAGNOSIS — Z9181 History of falling: Secondary | ICD-10-CM | POA: Diagnosis not present

## 2019-09-23 DIAGNOSIS — Z7951 Long term (current) use of inhaled steroids: Secondary | ICD-10-CM | POA: Diagnosis not present

## 2019-09-23 DIAGNOSIS — M1990 Unspecified osteoarthritis, unspecified site: Secondary | ICD-10-CM | POA: Diagnosis not present

## 2019-09-23 DIAGNOSIS — E039 Hypothyroidism, unspecified: Secondary | ICD-10-CM | POA: Diagnosis not present

## 2019-09-23 DIAGNOSIS — I130 Hypertensive heart and chronic kidney disease with heart failure and stage 1 through stage 4 chronic kidney disease, or unspecified chronic kidney disease: Secondary | ICD-10-CM | POA: Diagnosis not present

## 2019-09-23 DIAGNOSIS — E785 Hyperlipidemia, unspecified: Secondary | ICD-10-CM | POA: Diagnosis not present

## 2019-09-29 DIAGNOSIS — Z6833 Body mass index (BMI) 33.0-33.9, adult: Secondary | ICD-10-CM | POA: Diagnosis not present

## 2019-09-29 DIAGNOSIS — E6609 Other obesity due to excess calories: Secondary | ICD-10-CM | POA: Diagnosis not present

## 2019-09-29 DIAGNOSIS — M5412 Radiculopathy, cervical region: Secondary | ICD-10-CM | POA: Diagnosis not present

## 2019-09-30 DIAGNOSIS — Z86010 Personal history of colonic polyps: Secondary | ICD-10-CM | POA: Diagnosis not present

## 2019-09-30 DIAGNOSIS — M1990 Unspecified osteoarthritis, unspecified site: Secondary | ICD-10-CM | POA: Diagnosis not present

## 2019-09-30 DIAGNOSIS — Z9181 History of falling: Secondary | ICD-10-CM | POA: Diagnosis not present

## 2019-09-30 DIAGNOSIS — Z7951 Long term (current) use of inhaled steroids: Secondary | ICD-10-CM | POA: Diagnosis not present

## 2019-09-30 DIAGNOSIS — E1122 Type 2 diabetes mellitus with diabetic chronic kidney disease: Secondary | ICD-10-CM | POA: Diagnosis not present

## 2019-09-30 DIAGNOSIS — R32 Unspecified urinary incontinence: Secondary | ICD-10-CM | POA: Diagnosis not present

## 2019-09-30 DIAGNOSIS — I130 Hypertensive heart and chronic kidney disease with heart failure and stage 1 through stage 4 chronic kidney disease, or unspecified chronic kidney disease: Secondary | ICD-10-CM | POA: Diagnosis not present

## 2019-09-30 DIAGNOSIS — K219 Gastro-esophageal reflux disease without esophagitis: Secondary | ICD-10-CM | POA: Diagnosis not present

## 2019-09-30 DIAGNOSIS — I5032 Chronic diastolic (congestive) heart failure: Secondary | ICD-10-CM | POA: Diagnosis not present

## 2019-09-30 DIAGNOSIS — N1832 Chronic kidney disease, stage 3b: Secondary | ICD-10-CM | POA: Diagnosis not present

## 2019-09-30 DIAGNOSIS — J441 Chronic obstructive pulmonary disease with (acute) exacerbation: Secondary | ICD-10-CM | POA: Diagnosis not present

## 2019-09-30 DIAGNOSIS — E785 Hyperlipidemia, unspecified: Secondary | ICD-10-CM | POA: Diagnosis not present

## 2019-09-30 DIAGNOSIS — E039 Hypothyroidism, unspecified: Secondary | ICD-10-CM | POA: Diagnosis not present

## 2019-09-30 DIAGNOSIS — J9611 Chronic respiratory failure with hypoxia: Secondary | ICD-10-CM | POA: Diagnosis not present

## 2019-09-30 DIAGNOSIS — Z96611 Presence of right artificial shoulder joint: Secondary | ICD-10-CM | POA: Diagnosis not present

## 2019-09-30 DIAGNOSIS — Z9981 Dependence on supplemental oxygen: Secondary | ICD-10-CM | POA: Diagnosis not present

## 2019-10-02 DIAGNOSIS — J449 Chronic obstructive pulmonary disease, unspecified: Secondary | ICD-10-CM | POA: Diagnosis not present

## 2019-10-02 DIAGNOSIS — R0600 Dyspnea, unspecified: Secondary | ICD-10-CM | POA: Diagnosis not present

## 2019-10-28 ENCOUNTER — Ambulatory Visit: Payer: PPO | Admitting: Neurology

## 2019-11-01 DIAGNOSIS — R0600 Dyspnea, unspecified: Secondary | ICD-10-CM | POA: Diagnosis not present

## 2019-11-01 DIAGNOSIS — J449 Chronic obstructive pulmonary disease, unspecified: Secondary | ICD-10-CM | POA: Diagnosis not present

## 2019-11-16 ENCOUNTER — Other Ambulatory Visit (HOSPITAL_COMMUNITY): Payer: Self-pay | Admitting: Cardiovascular Disease

## 2019-11-16 ENCOUNTER — Other Ambulatory Visit: Payer: Self-pay

## 2019-11-16 ENCOUNTER — Ambulatory Visit (HOSPITAL_COMMUNITY)
Admission: RE | Admit: 2019-11-16 | Discharge: 2019-11-16 | Disposition: A | Discharge status: Home / Self Care | Payer: PPO | Source: Ambulatory Visit | Attending: Cardiovascular Disease | Admitting: Cardiovascular Disease

## 2019-11-16 DIAGNOSIS — I6523 Occlusion and stenosis of bilateral carotid arteries: Secondary | ICD-10-CM | POA: Insufficient documentation

## 2019-11-18 ENCOUNTER — Other Ambulatory Visit: Payer: Self-pay

## 2019-11-18 ENCOUNTER — Encounter (INDEPENDENT_AMBULATORY_CARE_PROVIDER_SITE_OTHER): Payer: PPO | Admitting: Ophthalmology

## 2019-11-18 DIAGNOSIS — I10 Essential (primary) hypertension: Secondary | ICD-10-CM | POA: Diagnosis not present

## 2019-11-18 DIAGNOSIS — H353221 Exudative age-related macular degeneration, left eye, with active choroidal neovascularization: Secondary | ICD-10-CM | POA: Diagnosis not present

## 2019-11-18 DIAGNOSIS — H35033 Hypertensive retinopathy, bilateral: Secondary | ICD-10-CM

## 2019-11-18 DIAGNOSIS — H353112 Nonexudative age-related macular degeneration, right eye, intermediate dry stage: Secondary | ICD-10-CM

## 2019-11-18 DIAGNOSIS — H43813 Vitreous degeneration, bilateral: Secondary | ICD-10-CM

## 2019-12-02 DIAGNOSIS — R0600 Dyspnea, unspecified: Secondary | ICD-10-CM | POA: Diagnosis not present

## 2019-12-02 DIAGNOSIS — J449 Chronic obstructive pulmonary disease, unspecified: Secondary | ICD-10-CM | POA: Diagnosis not present

## 2019-12-21 ENCOUNTER — Ambulatory Visit: Payer: PPO | Admitting: Neurology

## 2020-01-02 DIAGNOSIS — J449 Chronic obstructive pulmonary disease, unspecified: Secondary | ICD-10-CM | POA: Diagnosis not present

## 2020-01-02 DIAGNOSIS — R0600 Dyspnea, unspecified: Secondary | ICD-10-CM | POA: Diagnosis not present

## 2020-02-01 DIAGNOSIS — R0600 Dyspnea, unspecified: Secondary | ICD-10-CM | POA: Diagnosis not present

## 2020-02-01 DIAGNOSIS — J449 Chronic obstructive pulmonary disease, unspecified: Secondary | ICD-10-CM | POA: Diagnosis not present

## 2020-03-03 DIAGNOSIS — J449 Chronic obstructive pulmonary disease, unspecified: Secondary | ICD-10-CM | POA: Diagnosis not present

## 2020-03-03 DIAGNOSIS — R0600 Dyspnea, unspecified: Secondary | ICD-10-CM | POA: Diagnosis not present

## 2020-03-22 DIAGNOSIS — Z6833 Body mass index (BMI) 33.0-33.9, adult: Secondary | ICD-10-CM | POA: Diagnosis not present

## 2020-03-22 DIAGNOSIS — N183 Chronic kidney disease, stage 3 unspecified: Secondary | ICD-10-CM | POA: Diagnosis not present

## 2020-03-22 DIAGNOSIS — E782 Mixed hyperlipidemia: Secondary | ICD-10-CM | POA: Diagnosis not present

## 2020-03-22 DIAGNOSIS — M5030 Other cervical disc degeneration, unspecified cervical region: Secondary | ICD-10-CM | POA: Diagnosis not present

## 2020-03-22 DIAGNOSIS — M1991 Primary osteoarthritis, unspecified site: Secondary | ICD-10-CM | POA: Diagnosis not present

## 2020-03-22 DIAGNOSIS — E039 Hypothyroidism, unspecified: Secondary | ICD-10-CM | POA: Diagnosis not present

## 2020-03-22 DIAGNOSIS — E6609 Other obesity due to excess calories: Secondary | ICD-10-CM | POA: Diagnosis not present

## 2020-03-22 DIAGNOSIS — E1140 Type 2 diabetes mellitus with diabetic neuropathy, unspecified: Secondary | ICD-10-CM | POA: Diagnosis not present

## 2020-03-22 DIAGNOSIS — E7849 Other hyperlipidemia: Secondary | ICD-10-CM | POA: Diagnosis not present

## 2020-04-09 ENCOUNTER — Encounter: Payer: Self-pay | Admitting: Cardiovascular Disease

## 2020-04-09 NOTE — Progress Notes (Unsigned)
Sandra Cuevas Date of Birth  [birth year]       [facility]    [employer] [address]  [address], [address] [place], [place]  [address]   [place], [place]  [address] [phone]     [phone]   Fax  [phone]    Fax [phone]  Problem List: 1. Chest pain-normal stress Myoview study 2.  Chronic diastolic CHF - grade 2 DD  3. Hypertension 4. Diabetes Mellitus 5. Carotid bruit  IMPRESSION: Less than 50% stenosis in the right internal carotid artery with some plaque.  Stable 50-69% stenosis in the left internal carotid artery with significant calcified plaque.  Increasing velocity in the left external carotid artery compatible with significant narrowing.  6. RBBB  Previous notes:   Sandra Cuevas is an 87 year old female with a history of atypical chest pains. She had a negative stress Myoview study in March of 2012. She has a history of asthma and uses albuterol on an as-needed basis. She continues to have shortness of breath with exertion. He also has a history of hypertension.  She complains of chronic dyspnea.   She also has lots of back pain.  Dec. 16, 2014:  Sandra Cuevas is doing well from a cardiac standpoint. She's having lots of noncardiac problems. She's having problems with her bowels. She has lots of neck pain. She had an MRI performed several weeks ago which reveals significant cervical arthritis and disc disease. Her blood pressure has been normal.  She also has lots of asthma. She has used her inhalers on a regular basis.  Dec. 17, 2015:  Sandra Cuevas is a 87 yo with hx of noncardiac CP,  HTN, carotid bruit, and DM.  She may have some sinus isues, hoarsness and scratchy throat.   Jan. 10, 2017:  Oveall doing well. Has occasional intrascapular pain 3 weeks ago.   Lasted about 2 weeks.  Was worse with movement . Has resolved how.   Jan. 23, 2018:  No cardiac issues.   Seen with daughter, Sandra Cuevas .  Has back and hip  issues.   No CP or dyspnea.   Does her normal activities. Does have occasional dyspnea and takes a Lasix to resolve this  Takes Lasix every 2-3 weeks at the most.   Has grade 2 diastolic dysfunction .   June 16, 2017:  Sandra Cuevas is seen back today for follow-up of her chronic diastolic congestive heart failure, hypertension.  She is been seen in the emergency room on several occasions for chest pain.  She had the flu this past winter .    June 25, 2018:  Overall she is ding welll Seen for follow up of chronic diastoic CHF, HTN  No CP no dyspnea No cough or fever .   Aug 18, 2019: Sandra Cuevas is a 87 year old female with a history of chronic diastolic congestive heart failure, hypertension, hyperlipidemia. Has DOE with walking Has been on home O2 since that time  Gets DOE walking as little as walking to the bathroom   She was hospitalized from May 4 May 6 with shortness of breath. Chest angiogram revealed no evidence of pulmonary embolus.  She did have small bilateral pleural effusions.  She was diuresed with IV Lasix and transitioned to p.o. Lasix. Hydralazine was stopped .   Was eating more salt prior to her hospitalization  .  Ate several pieces of salted fat back a day for several days which lead to  her hospitalization .   Jan. 4, 2022: Sandra Cuevas is seen today for follow up of her chronic diastolic CHF, HTN, HLD She has moderate carotid artery disease  Has some right sided pain - thinks she slept on it wrong .  She had some recent labs with her primary MD which look good.  Potassium is 4.3  Creatinine is not listed.   Will call and get labs.     Current Outpatient Medications on File Prior to Visit  Medication Sig Dispense Refill  . acetaminophen (TYLENOL) 500 MG tablet Take 2 tablets (1,000 mg total) by mouth every 6 (six) hours as needed. 30 tablet 0  . albuterol (PROVENTIL) (5 MG/ML) 0.5% nebulizer solution Take 0.5 mLs (2.5 mg total) by nebulization every 4 (four) hours  as needed for wheezing or shortness of breath. 20 mL 12  . amLODipine (NORVASC) 10 MG tablet Take 1 tablet (10 mg total) by mouth daily. 30 tablet 0  . atorvastatin (LIPITOR) 40 MG tablet TAKE 1 TABLET(40 MG) BY MOUTH DAILY AT 6 PM 90 tablet 2  . famotidine (PEPCID) 20 MG tablet Take 1 tablet by mouth daily in the afternoon.    . fluticasone (FLONASE) 50 MCG/ACT nasal spray SHAKE LIQUID AND USE 2 SPRAYS IN EACH NOSTRIL DAILY 16 g 5  . furosemide (LASIX) 20 MG tablet Take 1 tablet (20 mg total) by mouth daily. 30 tablet 0  . gabapentin (NEURONTIN) 600 MG tablet Take 600 mg by mouth 3 (three) times daily.    Marland Kitchen levothyroxine (SYNTHROID, LEVOTHROID) 50 MCG tablet Take 50 mcg by mouth daily.    . montelukast (SINGULAIR) 10 MG tablet Take 1 tablet by mouth daily in the afternoon.    . Multiple Vitamins-Minerals (PRESERVISION AREDS 2+MULTI VIT PO) Take 1 tablet by mouth daily.    . Polyvinyl Alcohol-Povidone 5-6 MG/ML SOLN Place 1 drop into both eyes daily as needed (Dry Eyes).    . potassium chloride (MICRO-K) 10 MEQ CR capsule Take 10 mEq by mouth daily.     . traMADol (ULTRAM) 50 MG tablet Take 50 mg by mouth every 4 (four) hours as needed for pain.    Marland Kitchen zolpidem (AMBIEN) 10 MG tablet Take 5 mg by mouth at bedtime as needed for sleep.      No current facility-administered medications on file prior to visit.    No Known Allergies  Past Medical History:  Diagnosis Date  . Arthritis   . Asthma   . Atypical chest pain   . 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 COPD (chronic obstructive pulmonary disease) ([place])   . Diabetes mellitus   . GERD (gastroesophageal reflux disease)   . History of cardiovascular stress test 06/01/2010   EF 71% - no evidence of ischemia, normal left ventricular systolic function  . History of hysterectomy 1965  . Hyperlipidemia   . Hypertension   . Hypothyroidism   . Shortness of breath    on exertion  . Tubular adenoma of colon     Past Surgical  History:  Procedure Laterality Date  . ABDOMINAL HYSTERECTOMY    . CARDIAC SURGERY     catherization  . HEMORRHOID SURGERY    . TOTAL SHOULDER REPLACEMENT Right     Social History   Tobacco Use  Smoking Status Never Smoker  Smokeless Tobacco Never Used    Social History   Substance and Sexual Activity  Alcohol Use No    Family  History  Problem Relation Age of Onset  . Heart disease Father        heart problems  . Kidney failure Mother   . Diabetes Mother   . Leukemia Brother   . Diabetes Brother   . Colon cancer Sister   . Leukemia Brother   . Diabetes Brother   . Pancreatic cancer Brother   . Bone cancer Sister     Reviw of Systems:     Physical Exam: Blood pressure (!) 146/66, pulse 80, height 5\' 6"  (1.676 m), weight 207 lb (93.9 kg), SpO2 94 %.  GEN:  Well nourished, well developed in no acute distress HEENT: Normal NECK: No JVD; No carotid bruits LYMPHATICS: No lymphadenopathy CARDIAC: RRR , no murmurs, rubs, gallops RESPIRATORY:  Clear to auscultation without rales, wheezing or rhonchi  ABDOMEN: Soft, non-tender, non-distended MUSCULOSKELETAL:  No edema; No deformity  SKIN: Warm and dry NEUROLOGIC:  Alert and oriented x 3   ECG:        Assessment / Plan:        1.  . Acute on chronic diastolic CHF - Stable now that she is avoiding salt .    3. Hypertension -   BP is well controlled.  Overall , I think she is doing great.    4. Diabetes Mellitus -     5. Carotid bruit -    Stable .   6.  Hyperlipidemia :    Labs have looked ok     Mertie Moores, MD  04/11/2020 11:26 AM    [facility],  [place], [place]  [address] Pager [phone] Phone: [phone]; Fax: [phone]

## 2020-04-11 ENCOUNTER — Ambulatory Visit: Payer: PPO | Admitting: Cardiovascular Disease

## 2020-04-11 ENCOUNTER — Other Ambulatory Visit: Payer: Self-pay

## 2020-04-11 ENCOUNTER — Encounter: Payer: Self-pay | Admitting: Cardiovascular Disease

## 2020-04-11 VITALS — BP 146/66 | HR 80 | Ht 66.0 in | Wt 207.0 lb

## 2020-04-11 DIAGNOSIS — I5032 Chronic diastolic (congestive) heart failure: Secondary | ICD-10-CM | POA: Diagnosis not present

## 2020-04-11 DIAGNOSIS — I5031 Acute diastolic (congestive) heart failure: Secondary | ICD-10-CM

## 2020-04-11 NOTE — Patient Instructions (Signed)
Medication Instructions:  Your provider recommends that you continue on your current medications as directed. Please refer to the Current Medication list given to you today.   *If you need a refill on your cardiac medications before your next appointment, please call your pharmacy*  Follow-Up: At [facility], you and your health needs are our priority.  As part of our continuing mission to provide you with exceptional heart care, we have created designated Provider Care Teams.  These Care Teams include your primary Cardiologist (physician) and Advanced Practice Providers (APPs -  Physician Assistants and Nurse Practitioners) who all work together to provide you with the care you need, when you need it. Your next appointment:   6 month(s) The format for your next appointment:   In Person Provider:   You will see one of the following Advanced Practice Providers on your designated Care Team:    Scott Weaver, PA-C  Vin Bhagat, PA-C 

## 2020-04-13 ENCOUNTER — Other Ambulatory Visit: Payer: Self-pay

## 2020-04-13 ENCOUNTER — Encounter (INDEPENDENT_AMBULATORY_CARE_PROVIDER_SITE_OTHER): Payer: PPO | Admitting: Ophthalmology

## 2020-04-13 DIAGNOSIS — H353221 Exudative age-related macular degeneration, left eye, with active choroidal neovascularization: Secondary | ICD-10-CM

## 2020-04-13 DIAGNOSIS — E119 Type 2 diabetes mellitus without complications: Secondary | ICD-10-CM | POA: Diagnosis not present

## 2020-04-13 DIAGNOSIS — H43813 Vitreous degeneration, bilateral: Secondary | ICD-10-CM | POA: Diagnosis not present

## 2020-04-13 DIAGNOSIS — H353112 Nonexudative age-related macular degeneration, right eye, intermediate dry stage: Secondary | ICD-10-CM

## 2020-04-13 DIAGNOSIS — H35033 Hypertensive retinopathy, bilateral: Secondary | ICD-10-CM | POA: Diagnosis not present

## 2020-04-13 DIAGNOSIS — I10 Essential (primary) hypertension: Secondary | ICD-10-CM

## 2020-05-01 ENCOUNTER — Other Ambulatory Visit: Payer: Self-pay

## 2020-05-01 MED ORDER — ATORVASTATIN CALCIUM 40 MG PO TABS: ORAL_TABLET | ORAL | 3 refills | Status: DC

## 2020-05-03 ENCOUNTER — Ambulatory Visit (HOSPITAL_COMMUNITY)
Admission: RE | Admit: 2020-05-03 | Discharge: 2020-05-03 | Disposition: A | Discharge status: Home / Self Care | Payer: PPO | Source: Ambulatory Visit | Attending: Family Medicine | Admitting: Family Medicine

## 2020-05-03 ENCOUNTER — Other Ambulatory Visit (HOSPITAL_COMMUNITY): Payer: Self-pay | Admitting: Family Medicine

## 2020-05-03 ENCOUNTER — Other Ambulatory Visit: Payer: Self-pay

## 2020-05-03 DIAGNOSIS — M5136 Other intervertebral disc degeneration, lumbar region: Secondary | ICD-10-CM | POA: Diagnosis not present

## 2020-05-03 DIAGNOSIS — Z23 Encounter for immunization: Secondary | ICD-10-CM | POA: Diagnosis not present

## 2020-05-03 DIAGNOSIS — M5410 Radiculopathy, site unspecified: Secondary | ICD-10-CM | POA: Diagnosis not present

## 2020-05-03 DIAGNOSIS — Z6833 Body mass index (BMI) 33.0-33.9, adult: Secondary | ICD-10-CM | POA: Diagnosis not present

## 2020-05-03 DIAGNOSIS — M545 Low back pain, unspecified: Secondary | ICD-10-CM | POA: Diagnosis not present

## 2020-06-01 ENCOUNTER — Other Ambulatory Visit (HOSPITAL_COMMUNITY): Payer: Self-pay

## 2020-06-01 ENCOUNTER — Ambulatory Visit (HOSPITAL_COMMUNITY)
Admission: RE | Admit: 2020-06-01 | Discharge: 2020-06-01 | Disposition: A | Discharge status: Home / Self Care | Payer: PPO | Source: Ambulatory Visit | Attending: Physician Assistant | Admitting: Physician Assistant

## 2020-06-01 ENCOUNTER — Other Ambulatory Visit (HOSPITAL_COMMUNITY): Payer: Self-pay | Admitting: Physician Assistant

## 2020-06-01 ENCOUNTER — Other Ambulatory Visit: Payer: Self-pay

## 2020-06-01 DIAGNOSIS — M533 Sacrococcygeal disorders, not elsewhere classified: Secondary | ICD-10-CM

## 2020-06-01 DIAGNOSIS — Z6832 Body mass index (BMI) 32.0-32.9, adult: Secondary | ICD-10-CM | POA: Diagnosis not present

## 2020-06-01 DIAGNOSIS — E6609 Other obesity due to excess calories: Secondary | ICD-10-CM | POA: Diagnosis not present

## 2020-06-01 DIAGNOSIS — M47816 Spondylosis without myelopathy or radiculopathy, lumbar region: Secondary | ICD-10-CM | POA: Diagnosis not present

## 2020-06-12 ENCOUNTER — Encounter: Payer: Self-pay | Admitting: Neurology

## 2020-06-12 ENCOUNTER — Ambulatory Visit: Payer: PPO | Admitting: Neurology

## 2020-06-12 ENCOUNTER — Other Ambulatory Visit: Payer: Self-pay

## 2020-06-12 VITALS — BP 151/73 | HR 74 | Ht 66.0 in | Wt 201.5 lb

## 2020-06-12 DIAGNOSIS — G252 Other specified forms of tremor: Secondary | ICD-10-CM

## 2020-06-12 NOTE — Progress Notes (Signed)
Subjective:    Patient ID: Sandra Cuevas is a 87 y.o. female.  HPI     Star Age, MD, PhD [facility] [address] [place], [place] [address]  Dear Dr. Hilma Favors,   I saw your patient, Ameia Morency, upon your kind request, in my neurologic Clinic today for initial consultation of her tremors. The patient is accompanied by her daughter, [name], today. As you know, Ms. Scarpulla is an 87 year old right-handed woman with an underlying medical history of hyperlipidemia, reflux disease, hypothyroidism, arthritis, asthma, cancer, COPD, diabetes, hypertension, and mild obesity, who reports a hand tremor for the past 1 year, tremors are intermittent and lately, for the past 3 months they are not as apparent.  She had noticed tremor with holding something, she has not had any progressive tremors, no lower extremity tremor, no family history of tremor or Parkinson's disease.  She has 22 siblings, out of which 59 are half siblings, she has 2 children, 1 son, 1 daughter, neither sibling or child with tremor.  She does not recall that her parents had tremors, Mom lived to be 51 and had diabetes and father lived to be 18 and had heart disease.  She had a recent checkup with your office and also had blood work. I reviewed your office note from 08/23/2019. Of note, she is on several medications including potentially sedating medications, she is on gabapentin 600 mg 3 times daily, she also takes Ambien 10 mg at bedtime, she has a prescription for tramadol 50 mg strength 1 pill every 4 hours as needed.  She has not fallen.  She reports pain in both hips, first in the right hip for which she had a steroid course and now in her left hip, she is currently on prednisone.  She also reports neck pain, particularly on the left side with some radiation to the left shoulder and arm area.  The pain comes and goes.  She walks with a 2 wheeled walker.  She does not drink coffee but drinks  tea about 3-4 servings per day.  She tries to drink water throughout the day, estimates that she drinks about 6 cups of water per day.  Her Past Medical History Is Significant For: Past Medical History:  Diagnosis Date  . Arthritis   . Asthma   . Atypical chest pain   . 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 COPD (chronic obstructive pulmonary disease) ([place])   . Diabetes mellitus   . GERD (gastroesophageal reflux disease)   . History of cardiovascular stress test 06/01/2010   EF 71% - no evidence of ischemia, normal left ventricular systolic function  . History of hysterectomy 1965  . Hyperlipidemia   . Hypertension   . Hypothyroidism   . Shortness of breath    on exertion  . Tubular adenoma of colon     Her Past Surgical History Is Significant For: Past Surgical History:  Procedure Laterality Date  . ABDOMINAL HYSTERECTOMY    . CARDIAC SURGERY     catherization  . HEMORRHOID SURGERY    . TOTAL SHOULDER REPLACEMENT Right     Her Family History Is Significant For: Family History  Problem Relation Age of Onset  . Heart disease Father        heart problems  . Kidney failure Mother   . Diabetes Mother   . Leukemia Brother   . Diabetes Brother   . Colon  cancer Sister   . Leukemia Brother   . Diabetes Brother   . Pancreatic cancer Brother   . Bone cancer Sister     Her Social History Is Significant For: Social History   Socioeconomic History  . Marital status: Widowed    Spouse name: Not on file  . Number of children: 2  . Years of education: 8  . Highest education level: Not on file  Occupational History  . Occupation: Retired  Tobacco Use  . Smoking status: Never Smoker  . Smokeless tobacco: Never Used  Vaping Use  . Vaping Use: Never used  Substance and Sexual Activity  . Alcohol use: No  . Drug use: No  . Sexual activity: Never  Other Topics Concern  . Not on file  Social History Narrative   Right handed   Tea daily   Lives alone    Social Determinants of Health   Financial Resource Strain: Not on file  Food Insecurity: Not on file  Transportation Needs: Not on file  Physical Activity: Not on file  Stress: Not on file  Social Connections: Not on file    Her Allergies Are:  No Known Allergies:   Her Current Medications Are:  Outpatient Encounter Medications as of 06/12/2020  Medication Sig  . acetaminophen (TYLENOL) 500 MG tablet Take 2 tablets (1,000 mg total) by mouth every 6 (six) hours as needed.  Marland Kitchen albuterol (PROVENTIL) (5 MG/ML) 0.5% nebulizer solution Take 0.5 mLs (2.5 mg total) by nebulization every 4 (four) hours as needed for wheezing or shortness of breath.  Marland Kitchen amLODipine (NORVASC) 10 MG tablet Take 1 tablet (10 mg total) by mouth daily.  Marland Kitchen atorvastatin (LIPITOR) 40 MG tablet TAKE 1 TABLET(40 MG) BY MOUTH DAILY AT 6 PM  . famotidine (PEPCID) 20 MG tablet Take 1 tablet by mouth daily in the afternoon.  . fluticasone (FLONASE) 50 MCG/ACT nasal spray SHAKE LIQUID AND USE 2 SPRAYS IN EACH NOSTRIL DAILY  . gabapentin (NEURONTIN) 600 MG tablet Take 600 mg by mouth 3 (three) times daily.  Marland Kitchen levothyroxine (SYNTHROID, LEVOTHROID) 50 MCG tablet Take 50 mcg by mouth daily.  . montelukast (SINGULAIR) 10 MG tablet Take 1 tablet by mouth daily in the afternoon.  . Multiple Vitamins-Minerals (PRESERVISION AREDS 2+MULTI VIT PO) Take 1 tablet by mouth daily.  . Polyvinyl Alcohol-Povidone 5-6 MG/ML SOLN Place 1 drop into both eyes daily as needed (Dry Eyes).  . potassium chloride (MICRO-K) 10 MEQ CR capsule Take 10 mEq by mouth daily.   . traMADol (ULTRAM) 50 MG tablet Take 50 mg by mouth every 4 (four) hours as needed for pain.  Marland Kitchen zolpidem (AMBIEN) 10 MG tablet Take 5 mg by mouth at bedtime as needed for sleep.   . furosemide (LASIX) 20 MG tablet Take 1 tablet (20 mg total) by mouth daily.   No facility-administered encounter medications on file as of 06/12/2020.  :   Review of Systems:  Out of a complete 14 point  review of systems, all are reviewed and negative with the exception of these symptoms as listed below:  Review of Systems  Neurological:       RM 2 w/ daughter, [name]. Ambulated with rolling walker.    Objective:  Neurological Exam  Physical Exam Physical Examination:   Vitals:   06/12/20 1013  BP: (!) 151/73  Pulse: 74  SpO2: 95%    General Examination: The patient is a very pleasant 87 y.o. female in no acute distress. She appears well-developed  and well-nourished and well groomed.   HEENT: Normocephalic, atraumatic, pupils are equal, round and reactive to light, extraocular tracking is preserved, hearing mildly impaired.  Face is symmetric with normal facial animation.  No lip, neck or jaw tremor.  No carotid bruits.  Speech is clear with no dysarthria noted. There is no hypophonia. Neck is supple with full range of passive and active motion. Oropharynx exam reveals: moderate mouth dryness, adequate dental hygiene. Tongue protrudes centrally and palate elevates symmetrically.   Chest: Clear to auscultation without wheezing, rhonchi or crackles noted.  Heart: S1+S2+0, regular and normal without murmurs, rubs or gallops noted.   Abdomen: Soft, non-tender and non-distended.  Extremities: There is no pitting edema in the distal lower extremities bilaterally.   Skin: Warm and dry without trophic changes noted.  Musculoskeletal: exam reveals left hip pain, with radiation to the left gluteal region.  She has arthritic changes in both hands.  Does not have neck pain currently.    Neurologically:  Mental status: The patient is awake, alert and oriented to time, place, self and circumstance.  Memory is fairly well-preserved.  Daughter provides details to her history.  Cranial nerves II - XII are as described above under HEENT exam. In addition: shoulder shrug is normal with equal shoulder height noted.  On 06/12/2020: On Archimedes spiral drawing she has coarse difficulty with both  hands, no obvious trembling, handwriting is legible, slightly tremulous, not micrographic.  She has a very mild right upper extremity postural tremor, no significant action tremor, no resting tremor or intention tremor.  Motor exam: Normal bulk, strength and tone is noted. There is no drift, resting tremor or rebound. Romberg is not tested due to safety concerns.  Reflexes are 1-2+ throughout.  Fine motor skills and coordination: Globally mildly impaired but no significant decrement in amplitude noted with finger taps and foot taps.   Cerebellar testing: No dysmetria or intention tremor on finger to nose testing. Heel to shin is avoided due to hip pain.   Sensory exam: intact to light touch in the upper and lower extremities.  Gait, station and balance: She stands with difficwith difficulty and has to push herself up.  She requires no assistance.  She uses a 2 wheeled walker and has a limp on the left, no shuffling noted.   Assessment and Plan:   In summary, Bita C Lamboy is a very pleasant 87 y.o.-year old female with an underlying medical history of hyperlipidemia, reflux disease, hypothyroidism, arthritis, asthma, cancer, COPD, diabetes, hypertension, and mild obesity, who presents for evaluation of her tremors.  She has had an intermittent hand tremor, mostly noted on the right side.  On examination, she has a very mild right upper extremity postural tremor, no significant resting tremor, no left-sided tremor, no significant action tremor.  Findings are mild.  She does report improvement in her tremor in the past few months.  No signs of parkinsonism and she is reassured in that regard.  I would not recommend any symptomatic treatment of her tremor at this time because of milder presentation, for fear of side effects, and also the improvement noted by the patient.  We talked about tremor triggers quite a bit today.  We talked about her other symptoms including neck pain and hip pain.  She is advised to  follow-up with you in that regard, she may benefit from evaluation through a spine specialist regarding her neck pain.  She is advised to make sure she had her thyroid function  tested as thyroid dysfunction can cause tremors to exacerbate.  We talked about the importance of good hydration with water.  She is advised to follow-up with your office on a scheduled basis.  We can see her back in this office on an as-needed basis.  I answered all their questions today and the patient and her daughter were in agreement. Thank you very much for allowing me to participate in the care of this nice patient. If I can be of any further assistance to you please do not hesitate to call me at [phone].  Sincerely,   Star Age, MD, PhD

## 2020-06-12 NOTE — Patient Instructions (Signed)
You have a rather mild tremor in the right hand. I do not see any signs or symptoms of parkinson's like disease or what we call parkinsonism.   For your tremor, I would not recommend any new medication for fear of side effects or medication interactions, since you do take several medications.  We do not have to make a follow up appointment.   Please remember, that any kind of tremor may be exacerbated by anxiety, anger, nervousness, excitement, dehydration, sleep deprivation, thyroid dysfunction, by caffeine, and low blood sugar values or blood sugar fluctuations.   Please follow-up with your primary care physician as scheduled.  If you continue to have left hip pain and left sided neck pain, please talk to him about further evaluation.

## 2020-06-15 DIAGNOSIS — E1122 Type 2 diabetes mellitus with diabetic chronic kidney disease: Secondary | ICD-10-CM | POA: Diagnosis not present

## 2020-06-15 DIAGNOSIS — E1169 Type 2 diabetes mellitus with other specified complication: Secondary | ICD-10-CM | POA: Diagnosis not present

## 2020-06-15 DIAGNOSIS — I509 Heart failure, unspecified: Secondary | ICD-10-CM | POA: Diagnosis not present

## 2020-06-15 DIAGNOSIS — H353290 Exudative age-related macular degeneration, unspecified eye, stage unspecified: Secondary | ICD-10-CM | POA: Diagnosis not present

## 2020-06-15 DIAGNOSIS — N183 Chronic kidney disease, stage 3 unspecified: Secondary | ICD-10-CM | POA: Diagnosis not present

## 2020-06-15 DIAGNOSIS — I130 Hypertensive heart and chronic kidney disease with heart failure and stage 1 through stage 4 chronic kidney disease, or unspecified chronic kidney disease: Secondary | ICD-10-CM | POA: Diagnosis not present

## 2020-06-15 DIAGNOSIS — J449 Chronic obstructive pulmonary disease, unspecified: Secondary | ICD-10-CM | POA: Diagnosis not present

## 2020-06-15 DIAGNOSIS — E1159 Type 2 diabetes mellitus with other circulatory complications: Secondary | ICD-10-CM | POA: Diagnosis not present

## 2020-06-15 DIAGNOSIS — E261 Secondary hyperaldosteronism: Secondary | ICD-10-CM | POA: Diagnosis not present

## 2020-06-15 DIAGNOSIS — E1151 Type 2 diabetes mellitus with diabetic peripheral angiopathy without gangrene: Secondary | ICD-10-CM | POA: Diagnosis not present

## 2020-06-15 DIAGNOSIS — D692 Other nonthrombocytopenic purpura: Secondary | ICD-10-CM | POA: Diagnosis not present

## 2020-06-15 DIAGNOSIS — E785 Hyperlipidemia, unspecified: Secondary | ICD-10-CM | POA: Diagnosis not present

## 2020-07-13 ENCOUNTER — Other Ambulatory Visit: Payer: Self-pay

## 2020-07-13 ENCOUNTER — Encounter: Payer: Self-pay | Admitting: Adult Health

## 2020-07-13 ENCOUNTER — Ambulatory Visit: Payer: PPO | Admitting: Adult Health

## 2020-07-13 VITALS — BP 133/75 | HR 80 | Ht 66.0 in | Wt 200.4 lb

## 2020-07-13 DIAGNOSIS — Z90710 Acquired absence of both cervix and uterus: Secondary | ICD-10-CM

## 2020-07-13 DIAGNOSIS — N3946 Mixed incontinence: Secondary | ICD-10-CM

## 2020-07-13 DIAGNOSIS — Z90721 Acquired absence of ovaries, unilateral: Secondary | ICD-10-CM | POA: Diagnosis not present

## 2020-07-13 DIAGNOSIS — N816 Rectocele: Secondary | ICD-10-CM | POA: Diagnosis not present

## 2020-07-13 DIAGNOSIS — R195 Other fecal abnormalities: Secondary | ICD-10-CM | POA: Diagnosis not present

## 2020-07-13 DIAGNOSIS — K5900 Constipation, unspecified: Secondary | ICD-10-CM

## 2020-07-13 DIAGNOSIS — R102 Pelvic and perineal pain unspecified side: Secondary | ICD-10-CM | POA: Insufficient documentation

## 2020-07-13 DIAGNOSIS — M5432 Sciatica, left side: Secondary | ICD-10-CM | POA: Diagnosis not present

## 2020-07-13 DIAGNOSIS — M5431 Sciatica, right side: Secondary | ICD-10-CM | POA: Diagnosis not present

## 2020-07-13 DIAGNOSIS — Z1211 Encounter for screening for malignant neoplasm of colon: Secondary | ICD-10-CM | POA: Insufficient documentation

## 2020-07-13 LAB — HEMOCCULT GUIAC POC 1CARD (OFFICE): Fecal Occult Blood, POC: NEGATIVE

## 2020-07-13 NOTE — Progress Notes (Signed)
  Subjective:     Patient ID: Sandra Cuevas, female   DOB: December 18, 1928, 87 y.o.   MRN: [id number]  HPI Sandra Cuevas is a 87 year old white female, widowed, sp hysterectomy in complaining of pelvic pressure for about a month, started after right sciatica started. Has left sciatica now and has noticed change in bowels, like tootsie rolls, has constipation and uses miralax. Has UI, and wears pad, is up about 3 x at night, but takes lasix.  She says this back has affected her walking and she is using a walker.  Her daughter Sandra Cuevas is with her. PCP is Dr Hilma Favors.  Review of Systems +pelvic pressure +constipation and change in stool +nocturia and UI +sciatica on both sides, hips ache now too Has had gas at times  Reviewed past medical,surgical, social and family history. Reviewed medications and allergies.     Objective:   Physical Exam BP 133/75 (BP Location: Left Arm, Patient Position: Sitting, Cuff Size: Normal)   Pulse 80   Ht 5\' 6"  (1.676 m)   Wt 200 lb 6.4 oz (90.9 kg)   BMI 32.35 kg/m  Skin warm and dry.Pelvic: external genitalia is normal in appearance no lesions, vagina: pale with loss of moisture and ruage ,urethra has no lesions or masses noted, cervix and uterus are absent,adnexa: no masses or tenderness noted. Bladder is non tender and no masses felt. On rectal exam has good tone, no masses felt, hemoccult was negative, has rectocele AA [place] 0 Fall risk is low PHQ 9 score is 5 GAD 7 score is 0   Upstream - 07/13/20 1102      Pregnancy Intention Screening   Does the patient want to become pregnant in the next year? No    Does the patient's partner want to become pregnant in the next year? No    Would the patient like to discuss contraceptive options today? No      Contraception Wrap Up   Current Method --   hyst   End Method --   hyst   Contraception Counseling Provided No            Examination chaperoned by Estill Bamberg LPN Assessment:      1. Pelvic pressure in female I think  it may be related to nerves from back, has it started about a month ago, when right sciatica started   2. S/P hysterectomy with oophorectomy  3. Constipation, unspecified constipation type She takes miralax  4. Rectocele Explained to her and given medical explainer #4  5. Mixed stress and urge urinary incontinence She wears a pad  6. Change in stool If does not get better may need to see GI, it started when sciatica started about a month ago on the right side, now on left too  7. Encounter for screening fecal occult blood testing   8. Bilateral sciatica She is seeing [facility] tomorrow, tell them about bowel change too She is using rolling walker      Plan:     Follow up prn

## 2020-07-14 DIAGNOSIS — M545 Low back pain, unspecified: Secondary | ICD-10-CM | POA: Diagnosis not present

## 2020-07-27 DIAGNOSIS — M545 Low back pain, unspecified: Secondary | ICD-10-CM | POA: Diagnosis not present

## 2020-07-31 DIAGNOSIS — Z1283 Encounter for screening for malignant neoplasm of skin: Secondary | ICD-10-CM | POA: Diagnosis not present

## 2020-07-31 DIAGNOSIS — D225 Melanocytic nevi of trunk: Secondary | ICD-10-CM | POA: Diagnosis not present

## 2020-07-31 DIAGNOSIS — L821 Other seborrheic keratosis: Secondary | ICD-10-CM | POA: Diagnosis not present

## 2020-07-31 DIAGNOSIS — M545 Low back pain, unspecified: Secondary | ICD-10-CM | POA: Diagnosis not present

## 2020-08-22 DIAGNOSIS — H353114 Nonexudative age-related macular degeneration, right eye, advanced atrophic with subfoveal involvement: Secondary | ICD-10-CM | POA: Diagnosis not present

## 2020-08-22 DIAGNOSIS — H524 Presbyopia: Secondary | ICD-10-CM | POA: Diagnosis not present

## 2020-08-24 DIAGNOSIS — M545 Low back pain, unspecified: Secondary | ICD-10-CM | POA: Diagnosis not present

## 2020-09-14 ENCOUNTER — Encounter (INDEPENDENT_AMBULATORY_CARE_PROVIDER_SITE_OTHER): Payer: PPO | Admitting: Ophthalmology

## 2020-09-20 ENCOUNTER — Other Ambulatory Visit: Payer: Self-pay

## 2020-09-20 ENCOUNTER — Encounter (INDEPENDENT_AMBULATORY_CARE_PROVIDER_SITE_OTHER): Payer: PPO | Admitting: Ophthalmology

## 2020-09-20 DIAGNOSIS — H43813 Vitreous degeneration, bilateral: Secondary | ICD-10-CM

## 2020-09-20 DIAGNOSIS — H353221 Exudative age-related macular degeneration, left eye, with active choroidal neovascularization: Secondary | ICD-10-CM | POA: Diagnosis not present

## 2020-09-20 DIAGNOSIS — H35033 Hypertensive retinopathy, bilateral: Secondary | ICD-10-CM | POA: Diagnosis not present

## 2020-09-20 DIAGNOSIS — I10 Essential (primary) hypertension: Secondary | ICD-10-CM

## 2020-09-20 DIAGNOSIS — H353114 Nonexudative age-related macular degeneration, right eye, advanced atrophic with subfoveal involvement: Secondary | ICD-10-CM

## 2020-10-23 DIAGNOSIS — M545 Low back pain, unspecified: Secondary | ICD-10-CM | POA: Diagnosis not present

## 2020-11-15 ENCOUNTER — Other Ambulatory Visit (HOSPITAL_COMMUNITY): Payer: Self-pay | Admitting: Cardiovascular Disease

## 2020-11-15 ENCOUNTER — Other Ambulatory Visit: Payer: Self-pay

## 2020-11-15 ENCOUNTER — Ambulatory Visit (HOSPITAL_COMMUNITY)
Admission: RE | Admit: 2020-11-15 | Discharge: 2020-11-15 | Disposition: A | Discharge status: Home / Self Care | Payer: PPO | Source: Ambulatory Visit | Attending: Cardiology | Admitting: Cardiology

## 2020-11-15 DIAGNOSIS — I6523 Occlusion and stenosis of bilateral carotid arteries: Secondary | ICD-10-CM

## 2020-11-15 DIAGNOSIS — I771 Stricture of artery: Secondary | ICD-10-CM

## 2020-11-17 ENCOUNTER — Telehealth: Payer: Self-pay | Admitting: *Deleted

## 2020-11-17 DIAGNOSIS — I779 Disorder of arteries and arterioles, unspecified: Secondary | ICD-10-CM

## 2020-11-17 DIAGNOSIS — R0989 Other specified symptoms and signs involving the circulatory and respiratory systems: Secondary | ICD-10-CM

## 2020-11-17 DIAGNOSIS — I6529 Occlusion and stenosis of unspecified carotid artery: Secondary | ICD-10-CM

## 2020-11-17 DIAGNOSIS — E785 Hyperlipidemia, unspecified: Secondary | ICD-10-CM

## 2020-11-17 NOTE — Telephone Encounter (Signed)
-----   Message from Thayer Headings, MD sent at 11/16/2020  5:59 PM EDT ----- Moderate carotid artery disease. Repeat study in 1 year

## 2020-11-17 NOTE — Telephone Encounter (Signed)
Spoke with the pts daughter [name] (on [place]) and informed her of the pts carotid US results and plan per Dr. Acie Fredrickson.  Informed the pts daughter that I will go ahead and place the order for repeat carotids in 1 year in the system, and send a message to our PV Scheduler to call her back and arrange this for the pt.  Daughter verbalized understanding and agrees with this plan.

## 2020-12-04 DIAGNOSIS — M545 Low back pain, unspecified: Secondary | ICD-10-CM | POA: Diagnosis not present

## 2020-12-13 DIAGNOSIS — I5030 Unspecified diastolic (congestive) heart failure: Secondary | ICD-10-CM | POA: Diagnosis not present

## 2020-12-13 DIAGNOSIS — M5030 Other cervical disc degeneration, unspecified cervical region: Secondary | ICD-10-CM | POA: Diagnosis not present

## 2020-12-13 DIAGNOSIS — M5136 Other intervertebral disc degeneration, lumbar region: Secondary | ICD-10-CM | POA: Diagnosis not present

## 2020-12-13 DIAGNOSIS — M1991 Primary osteoarthritis, unspecified site: Secondary | ICD-10-CM | POA: Diagnosis not present

## 2020-12-13 DIAGNOSIS — E669 Obesity, unspecified: Secondary | ICD-10-CM | POA: Diagnosis not present

## 2020-12-13 DIAGNOSIS — E039 Hypothyroidism, unspecified: Secondary | ICD-10-CM | POA: Diagnosis not present

## 2020-12-13 DIAGNOSIS — Z23 Encounter for immunization: Secondary | ICD-10-CM | POA: Diagnosis not present

## 2020-12-13 DIAGNOSIS — E1165 Type 2 diabetes mellitus with hyperglycemia: Secondary | ICD-10-CM | POA: Diagnosis not present

## 2020-12-13 DIAGNOSIS — E782 Mixed hyperlipidemia: Secondary | ICD-10-CM | POA: Diagnosis not present

## 2020-12-13 DIAGNOSIS — J45909 Unspecified asthma, uncomplicated: Secondary | ICD-10-CM | POA: Diagnosis not present

## 2020-12-13 DIAGNOSIS — Z681 Body mass index (BMI) 19 or less, adult: Secondary | ICD-10-CM | POA: Diagnosis not present

## 2020-12-18 IMAGING — DX DG HIP (WITH OR WITHOUT PELVIS) 2-3V*L*
3 series · 3 of 3 positions shown · non-contrast
Comparison: 10/28/2016

CLINICAL DATA: Left lower quadrant and left hip pain. No known
injury.

EXAM:
DG HIP (WITH OR WITHOUT PELVIS) 2-3V LEFT

[pelvis ap]
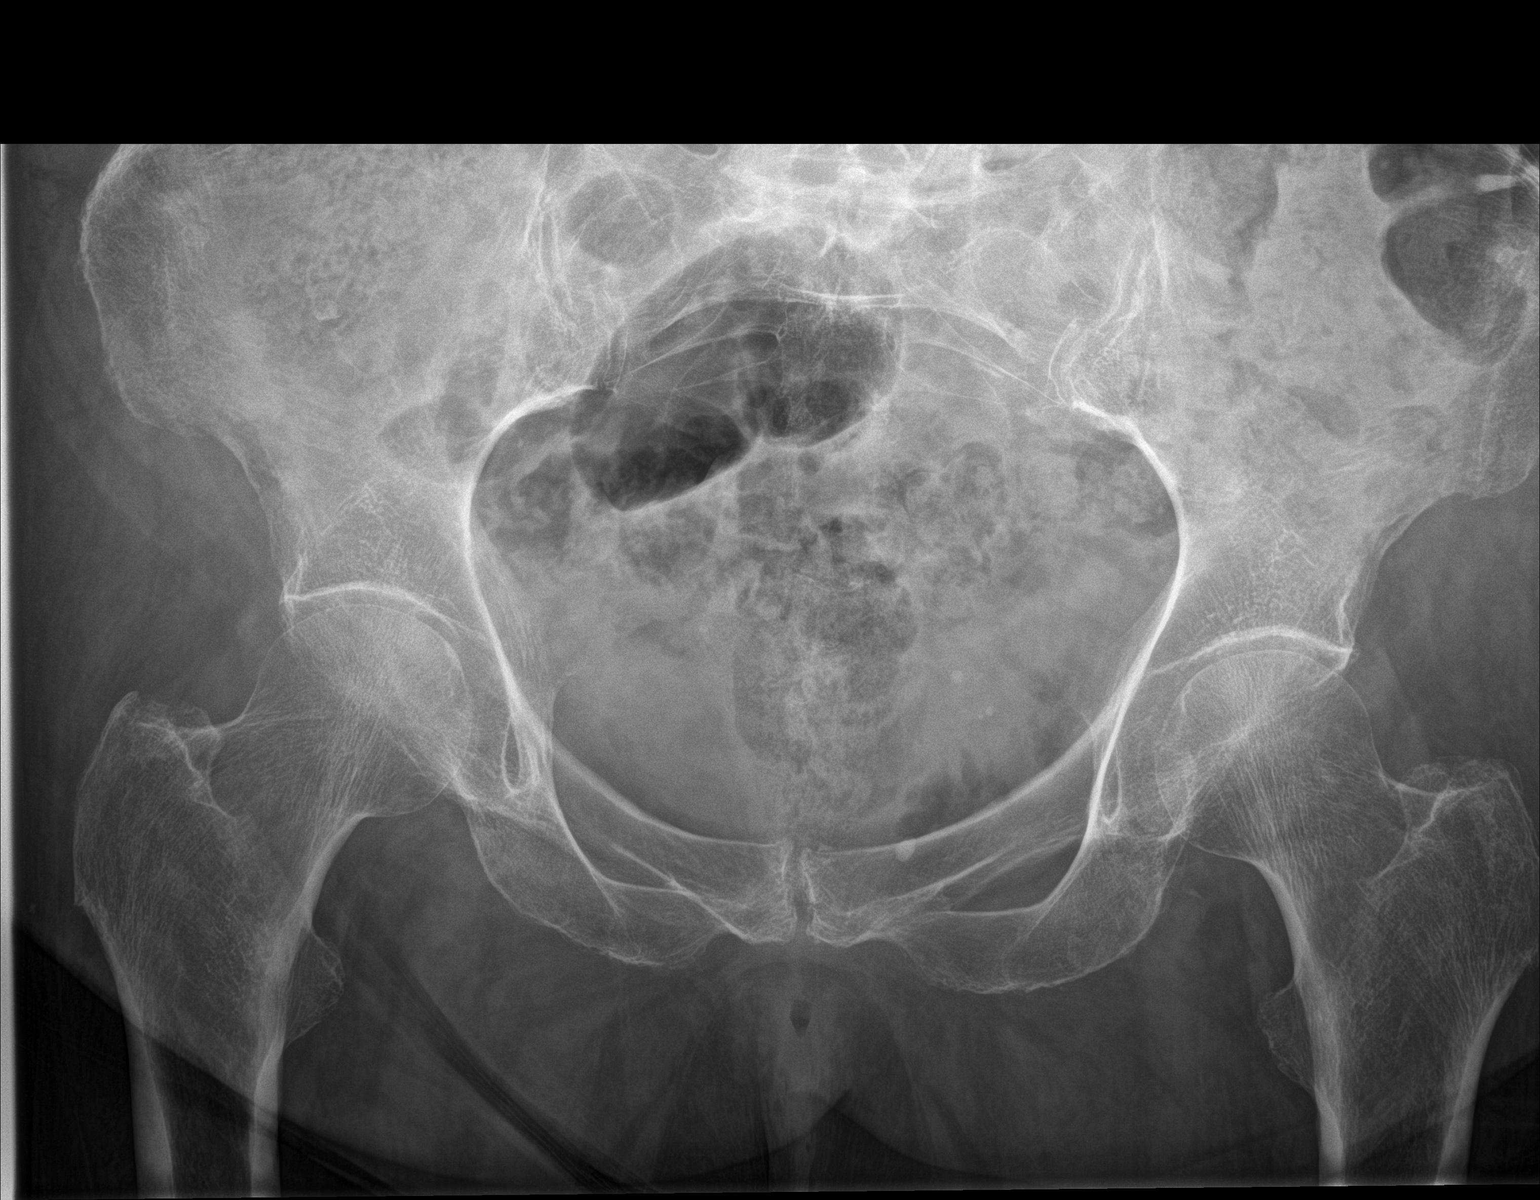

[hip ap]
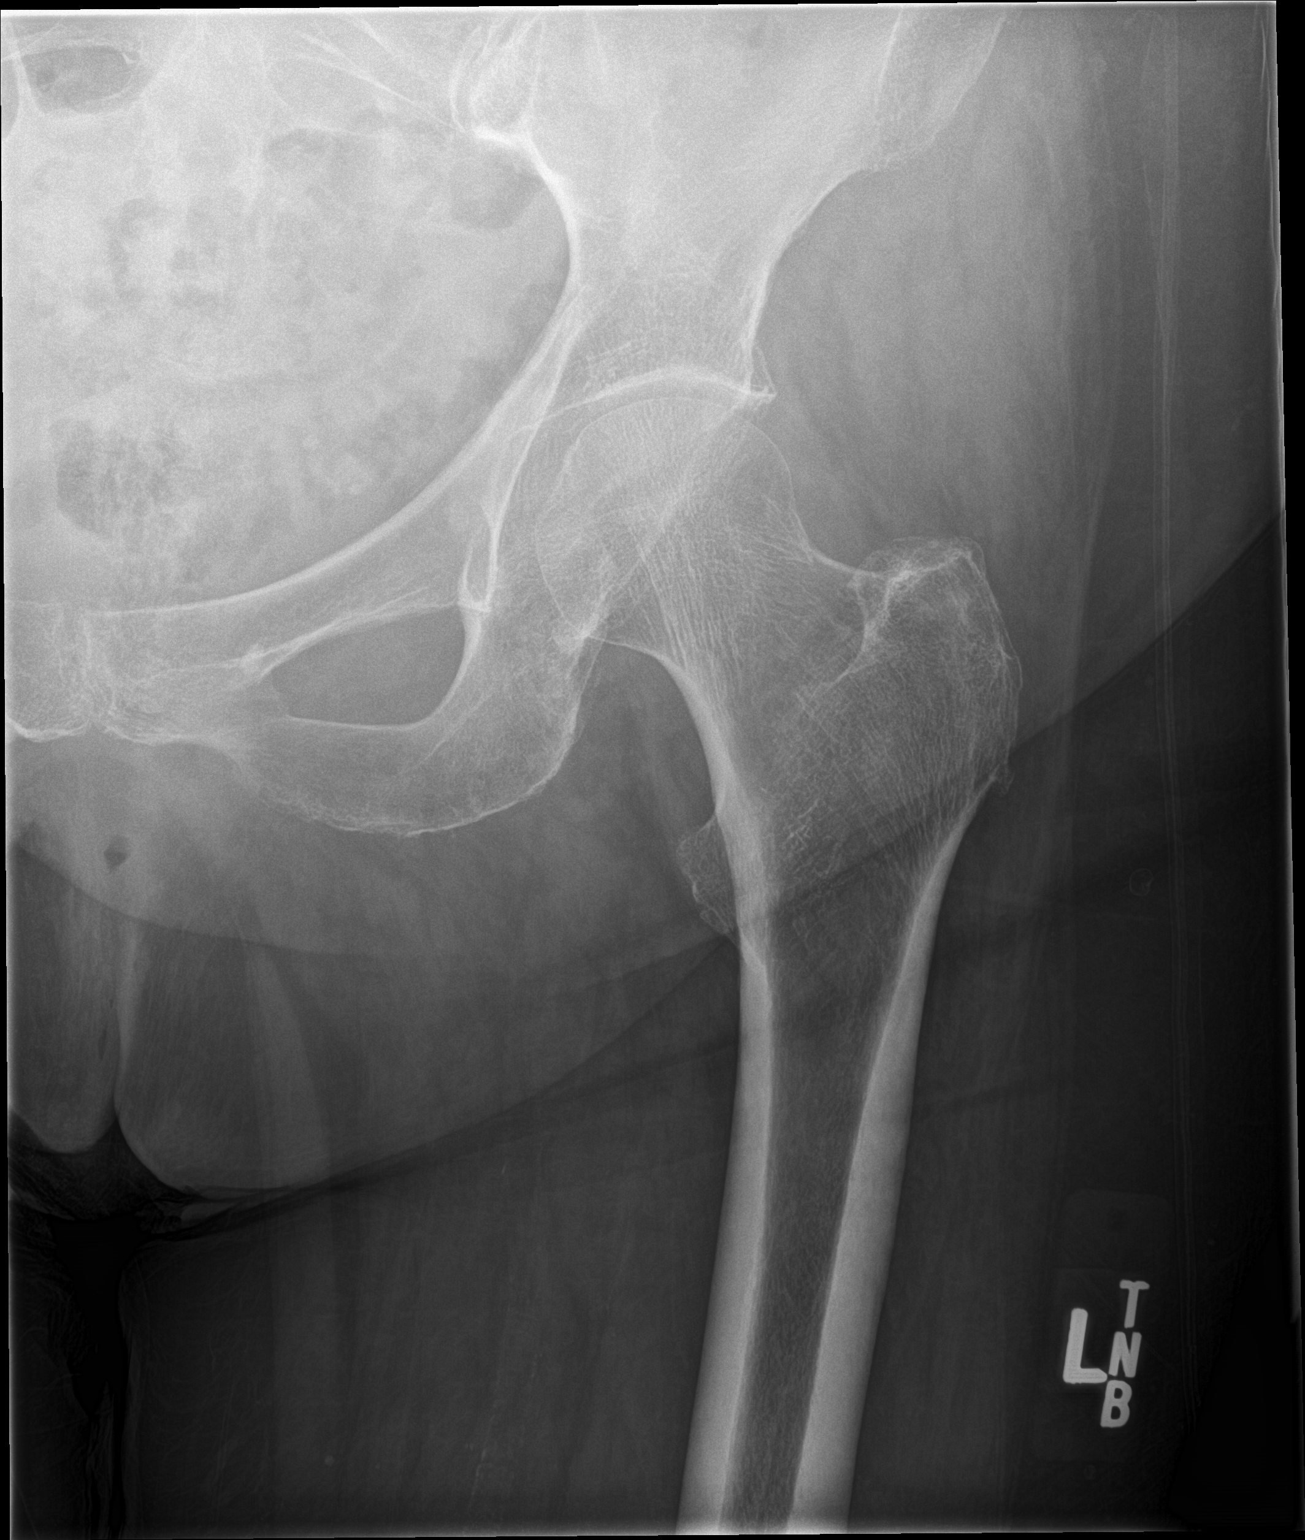

[hip lat]
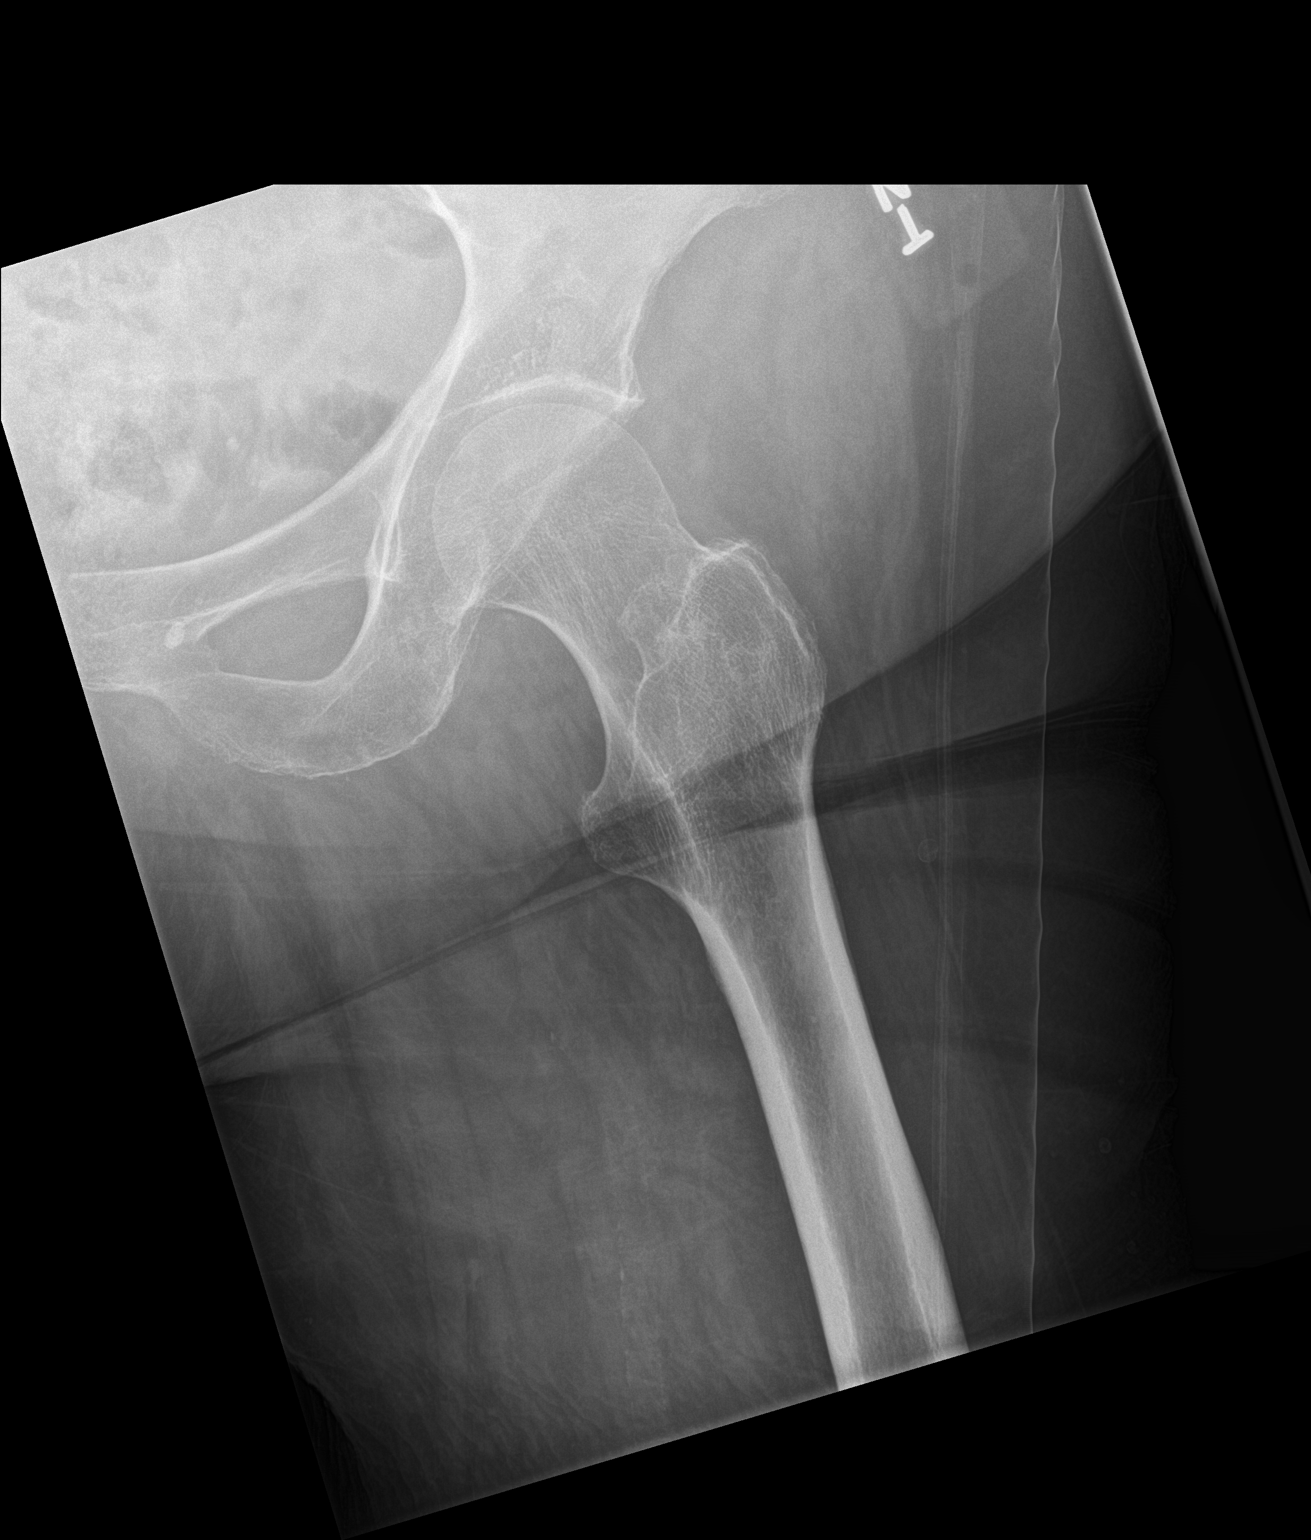

[3 of 3 positions shown; findings below may reference images not displayed]

FINDINGS: Mild symmetric degenerative changes in the hips. Diffuse osteopenia.
No acute bony abnormality. Specifically, no fracture, subluxation,
or dislocation.
IMPRESSION: No acute bony abnormality.

## 2021-01-15 DIAGNOSIS — M545 Low back pain, unspecified: Secondary | ICD-10-CM | POA: Diagnosis not present

## 2021-01-25 DIAGNOSIS — M25562 Pain in left knee: Secondary | ICD-10-CM | POA: Diagnosis not present

## 2021-03-07 ENCOUNTER — Other Ambulatory Visit: Payer: Self-pay

## 2021-03-07 ENCOUNTER — Encounter (INDEPENDENT_AMBULATORY_CARE_PROVIDER_SITE_OTHER): Payer: PPO | Admitting: Ophthalmology

## 2021-03-07 DIAGNOSIS — H353114 Nonexudative age-related macular degeneration, right eye, advanced atrophic with subfoveal involvement: Secondary | ICD-10-CM | POA: Diagnosis not present

## 2021-03-07 DIAGNOSIS — H43813 Vitreous degeneration, bilateral: Secondary | ICD-10-CM

## 2021-03-07 DIAGNOSIS — I10 Essential (primary) hypertension: Secondary | ICD-10-CM

## 2021-03-07 DIAGNOSIS — H35033 Hypertensive retinopathy, bilateral: Secondary | ICD-10-CM

## 2021-03-07 DIAGNOSIS — H353221 Exudative age-related macular degeneration, left eye, with active choroidal neovascularization: Secondary | ICD-10-CM

## 2021-05-01 ENCOUNTER — Other Ambulatory Visit: Payer: Self-pay | Admitting: *Deleted

## 2021-05-01 MED ORDER — ATORVASTATIN CALCIUM 40 MG PO TABS: ORAL_TABLET | ORAL | 0 refills | Status: DC

## 2021-05-16 ENCOUNTER — Ambulatory Visit
Admission: EM | Admit: 2021-05-16 | Discharge: 2021-05-16 | Disposition: A | Discharge status: Home / Self Care | Payer: PPO | Attending: Family Medicine | Admitting: Family Medicine

## 2021-05-16 ENCOUNTER — Other Ambulatory Visit: Payer: Self-pay

## 2021-05-16 ENCOUNTER — Ambulatory Visit (INDEPENDENT_AMBULATORY_CARE_PROVIDER_SITE_OTHER): Payer: PPO

## 2021-05-16 DIAGNOSIS — R7981 Abnormal blood-gas level: Secondary | ICD-10-CM

## 2021-05-16 DIAGNOSIS — R051 Acute cough: Secondary | ICD-10-CM

## 2021-05-16 DIAGNOSIS — J441 Chronic obstructive pulmonary disease with (acute) exacerbation: Secondary | ICD-10-CM | POA: Diagnosis not present

## 2021-05-16 DIAGNOSIS — R062 Wheezing: Secondary | ICD-10-CM

## 2021-05-16 DIAGNOSIS — R059 Cough, unspecified: Secondary | ICD-10-CM | POA: Diagnosis not present

## 2021-05-16 DIAGNOSIS — J90 Pleural effusion, not elsewhere classified: Secondary | ICD-10-CM | POA: Diagnosis not present

## 2021-05-16 DIAGNOSIS — R0602 Shortness of breath: Secondary | ICD-10-CM | POA: Diagnosis not present

## 2021-05-16 DIAGNOSIS — R0902 Hypoxemia: Secondary | ICD-10-CM | POA: Diagnosis not present

## 2021-05-16 MED ORDER — PREDNISONE 20 MG PO TABS: 40.0000 mg | ORAL_TABLET | Freq: Every day | ORAL | 0 refills | Status: DC

## 2021-05-16 MED ORDER — ALBUTEROL SULFATE (2.5 MG/3ML) 0.083% IN NEBU: 2.5000 mg | INHALATION_SOLUTION | Freq: Four times a day (QID) | RESPIRATORY_TRACT | 0 refills | Status: DC | PRN

## 2021-05-16 MED ORDER — IPRATROPIUM-ALBUTEROL 0.5-2.5 (3) MG/3ML IN SOLN
3.0000 mL | Freq: Once | RESPIRATORY_TRACT | Status: AC
  Administered 2021-05-16: 3 mL via RESPIRATORY_TRACT

## 2021-05-16 NOTE — ED Triage Notes (Signed)
Pt reports chest cold, congestion,on and off since Christmas 2022; cough x 3 weeks. Pt reports O2 was between 81-85 today.

## 2021-05-19 NOTE — ED Provider Notes (Signed)
[place]   [id number] 05/16/21 Arrival Time: 8101  ASSESSMENT & PLAN:  1. Acute cough   2. Wheezing   3. Borderline low oxygen saturation level   4. COPD exacerbation ([place])    I have personally viewed the imaging studies ordered this visit. No acute changes on CXR. No signs of fluid overload or PNA.  Meds ordered this encounter  Medications   ipratropium-albuterol (DUONEB) 0.5-2.5 (3) MG/3ML nebulizer solution 3 mL   Spo2 holding around 94% on RA after DuoNeb. Drops slightly if she tries to speak a lot. Recovers quickly. No current SOB.  Suspect viral trigger; discussed.  Discharge Medication List as of 05/16/2021  3:48 PM     START taking these medications   Details  albuterol (PROVENTIL) (2.5 MG/3ML) 0.083% nebulizer solution Take 3 mLs (2.5 mg total) by nebulization every 6 (six) hours as needed for wheezing or shortness of breath., Starting Wed 05/16/2021, Normal    predniSONE (DELTASONE) 20 MG tablet Take 2 tablets (40 mg total) by mouth daily., Starting Wed 05/16/2021, Normal       Recommend:  Follow-up Information     [facility] EMERGENCY DEPARTMENT.   Specialty: Emergency Medicine Why: If symptoms worsen in any way or your oxygen level does not stay above 92%. Contact information: [address] [id number] [place] [address] [phone]                Reviewed expectations re: course of current medical issues. Questions answered. Outlined signs and symptoms indicating need for more acute intervention. Understanding verbalized. After Visit Summary given.   SUBJECTIVE: History from: Patient and Family. Sandra Cuevas is a 88 y.o. female. Reports: chest cold, nasal congestion; questions over past 1-2 weeks; worse past few days. No fever. Reports O2 sat was low at home. Does get SOB with exertion. No CP. Denies: fever. Normal PO intake without n/v/d.  OBJECTIVE:  Vitals:   05/16/21 1455 05/16/21 1520 05/16/21 1536   BP: (!) 174/70    Pulse: 90 74 74  Resp: (!) 24  18  Temp: 97.7 F (36.5 C)    TempSrc: Oral    SpO2: (!) 85% 96% 94%    Initial hypoxia noted along with increased RR. Recheck RR 18 after DuoNeb.  General appearance: alert Eyes: PERRLA; EOMI; conjunctiva normal HENT: [place]; AT; with nasal congestion Neck: supple  Lungs: speaks full sentences without difficulty; unlabored; overall clear with feint wheezing at bases Extremities: no edema Skin: warm and dry Neurologic: normal gait Psychological: alert and cooperative; normal mood and affect   Imaging: DG Chest 2 View  Result Date: 05/16/2021 CLINICAL DATA:  Shortness of breath, hypoxia, cough with low O2 saturation. EXAM: CHEST - 2 VIEW COMPARISON:  Radiographs 08/10/2019.  CT 08/10/2019. FINDINGS: The heart size and mediastinal contours are stable with aortic atherosclerosis. The lungs are hyperinflated with chronic central airway thickening. The pleural effusions and basilar airspace opacities demonstrated previously have resolved. There is no pneumothorax. No acute osseous findings are seen status post right shoulder arthroplasty. There are degenerative changes in the spine. IMPRESSION: Resolved edema, pleural effusions and bibasilar airspace opacities. Chronic central airway thickening. No acute cardiopulmonary process. Electronically Signed   By: Richardean Sale M.D.   On: 05/16/2021 15:14    No Known Allergies  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  YEARS AGO "   COPD (chronic obstructive pulmonary disease) (HCC)    Diabetes mellitus    GERD (gastroesophageal reflux disease)    History of cardiovascular stress test 06/01/2010   EF 71% - no evidence of ischemia, normal left ventricular systolic function   History of hysterectomy 1965   Hyperlipidemia    Hypertension    Hypothyroidism    Shortness of breath    on exertion   Tubular  adenoma of colon    Social History   Socioeconomic History   Marital status: Widowed    Spouse name: Not on file   Number of children: 2   Years of education: 8   Highest education level: Not on file  Occupational History   Occupation: Retired  Tobacco Use   Smoking status: Never   Smokeless tobacco: Never  Vaping Use   Vaping Use: Never used  Substance and Sexual Activity   Alcohol use: No   Drug use: Never   Sexual activity: Not Currently    Birth control/protection: Surgical    Comment: hyst  Other Topics Concern   Not on file  Social History Narrative   Right handed   Tea daily   Lives alone   Social Determinants of Health   Financial Resource Strain: Low Risk    Difficulty of Paying Living Expenses: Not very hard  Food Insecurity: No Food Insecurity   Worried About Running Out of Food in the Last Year: Never true   [employer] in the Last Year: Never true  Transportation Needs: No Transportation Needs   Lack of Transportation (Medical): No   Lack of Transportation (Non-Medical): No  Physical Activity: Inactive   Days of Exercise per Week: 0 days   Minutes of Exercise per Session: 0 min  Stress: No Stress Concern Present   Feeling of Stress : Only a little  Social Connections: Moderately Isolated   Frequency of Communication with Friends and Family: More than three times a week   Frequency of Social Gatherings with Friends and Family: More than three times a week   Attends Religious Services: More than 4 times per year   Active Member of [employer] or Organizations: No   Attends Archivist Meetings: Never   Marital Status: Widowed  Human resources officer Violence: Not At Risk   Fear of Current or Ex-Partner: No   Emotionally Abused: No   Physically Abused: No   Sexually Abused: No   Family History  Problem Relation Age of Onset   Heart disease Father        heart problems   Kidney failure Mother    Diabetes Mother    Leukemia Brother    Diabetes  Brother    Colon cancer Sister    Leukemia Brother    Diabetes Brother    Pancreatic cancer Brother    Bone cancer Sister    Past Surgical History:  Procedure Laterality Date   ABDOMINAL HYSTERECTOMY     CARDIAC SURGERY     catherization   HEMORRHOID SURGERY     TOTAL SHOULDER REPLACEMENT Right      Vanessa Kick, MD 05/19/21 1105

## 2021-06-11 ENCOUNTER — Other Ambulatory Visit: Payer: Self-pay | Admitting: Cardiovascular Disease

## 2021-07-02 NOTE — Progress Notes (Addendum)
?Cardiology Office Note:   ? ?Date:  07/03/2021  ? ?ID:  Sandra Cuevas, DOB 03-18-1929, MRN [id number] ? ?PCP:  Sharilyn Sites, MD  ?[facility] Providers ?Cardiologist:  Mertie Moores, MD    ?Referring MD: Sharilyn Sites, MD  ? ?Chief Complaint:  Follow-up CHF ?  ? ?Patient Profile: ?(HFpEF) heart failure with preserved ejection fraction  ?Diabetes mellitus  ?Hypertension  ?Hyperlipidemia  ?Carotid artery disease ?Right Bundle Branch Block ?Low risk Myoview in 2012 ?Asthma/Chronic Obstructive Pulmonary Disease  ?GERD ?Hypothyroidism ? ?Prior CV Studies: ?Carotid US 11/15/20 ?Bilateral ICA 40-59 ? ?Echocardiogram 08/11/19 ?EF 60-65, no RWMA, Gr 1 DD, normal RVSF, mod LAE, mild MS (mean 6.5 mmHg), mild AS (mean 11.5 mmHg, Vmax 255 cm/s, DI 0.46) ? ?Cardiac catheterization 02/15/2000 ?Normal coronary arteries ?  ? ?History of Present Illness:   ?Sandra Cuevas is a 88 y.o. female with the above problem list.  She was last seen by Dr. Acie Fredrickson in 04/2020.  She returns for f/u.  She is here with her daughter.  Overall, she is doing well.  She has not had chest discomfort.  She has some shortness of breath with exertion that is overall stable.  She went to urgent care last month for bronchitis and was given nebulizer treatment as well as prednisone.  She still uses a nebulizer at home.  Her oxygen was somewhat low when she went to urgent care.  Her son purchased oxygen for her to wear at night.  She has not seen pulmonology in quite some time.  She has not had orthopnea, significant leg edema or syncope.     ?   ?Past Medical History:  ?Diagnosis Date  ? Arthritis   ? Asthma   ? Atypical chest pain   ? Cancer [facility])   ? 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 COPD (chronic obstructive pulmonary disease) ([place])   ? Diabetes mellitus   ? GERD (gastroesophageal reflux disease)   ? History of cardiovascular stress test 06/01/2010  ? EF 71% - no evidence of ischemia, normal left ventricular systolic function  ? History of hysterectomy 1965   ? Hyperlipidemia   ? Hypertension   ? Hypothyroidism   ? Shortness of breath   ? on exertion  ? Tubular adenoma of colon   ? ?Current Medications: ?Current Meds  ?Medication Sig  ? acetaminophen (TYLENOL) 500 MG tablet Take 2 tablets (1,000 mg total) by mouth every 6 (six) hours as needed.  ? albuterol (PROVENTIL) (2.5 MG/3ML) 0.083% nebulizer solution Take 3 mLs (2.5 mg total) by nebulization every 6 (six) hours as needed for wheezing or shortness of breath.  ? amLODipine (NORVASC) 10 MG tablet Take 1 tablet (10 mg total) by mouth daily.  ? famotidine (PEPCID) 20 MG tablet Take 1 tablet by mouth daily in the afternoon.  ? fluticasone (FLONASE) 50 MCG/ACT nasal spray SHAKE LIQUID AND USE 2 SPRAYS IN EACH NOSTRIL DAILY  ? furosemide (LASIX) 20 MG tablet Take 1 tablet (20 mg total) by mouth daily.  ? gabapentin (NEURONTIN) 600 MG tablet Take 600 mg by mouth 3 (three) times daily.  ? levothyroxine (SYNTHROID, LEVOTHROID) 50 MCG tablet Take 50 mcg by mouth daily.  ? montelukast (SINGULAIR) 10 MG tablet Take 1 tablet by mouth daily in the afternoon.  ? Multiple Vitamins-Minerals (PRESERVISION AREDS 2+MULTI VIT PO) Take 1 tablet by mouth daily.  ? Polyvinyl Alcohol-Povidone 5-6 MG/ML SOLN Place 1 drop into both eyes daily as needed (Dry  Eyes).  ? potassium chloride (MICRO-K) 10 MEQ CR capsule Take 10 mEq by mouth daily.   ? traMADol (ULTRAM) 50 MG tablet Take 50 mg by mouth every 4 (four) hours as needed for pain.  ? zolpidem (AMBIEN) 10 MG tablet Take 5 mg by mouth at bedtime as needed for sleep.   ? [DISCONTINUED] atorvastatin (LIPITOR) 40 MG tablet TAKE 1 TABLET(40 MG) BY MOUTH DAILY AT 6 PM. Please make overdue appt with Dr. Acie Fredrickson before anymore refills. Thank you 2nd attempt  ?  ?Allergies:   Patient has no known allergies.  ? ?Social History  ? ?Tobacco Use  ? Smoking status: Never  ? Smokeless tobacco: Never  ?Vaping Use  ? Vaping Use: Never used  ?Substance Use Topics  ? Alcohol use: No  ? Drug use: Never  ?   ?Family Hx: ?The patient's family history includes Bone cancer in her sister; Colon cancer in her sister; Diabetes in her brother, brother, and mother; Heart disease in her father; Kidney failure in her mother; Leukemia in her brother and brother; Pancreatic cancer in her brother. ? ?Review of Systems  ?Respiratory:  Positive for cough.    ? ?EKGs/Labs/Other Test Reviewed:   ? ?EKG:  EKG is   ordered today.  The ekg ordered today demonstrates normal sinus rhythm, HR 80, right bundle branch block, no change from prior tracing ? ?Recent Labs: ?No results found for requested labs within last 8760 hours.  ? ?Recent Lipid Panel ?No results for input(s): CHOL, TRIG, HDL, VLDL, LDLCALC, LDLDIRECT in the last 8760 hours.  ? ?Risk Assessment/Calculations:   ?  ?    ?Physical Exam:   ? ?VS:  BP (!) 170/78 (BP Location: Right Arm, Patient Position: Sitting, Cuff Size: Normal)   Pulse 80   Ht $R'5\' 5"'tp$  (1.651 m)   Wt 199 lb 3.2 oz (90.4 kg)   SpO2 90%   BMI 33.15 kg/m?    ? ?Wt Readings from Last 3 Encounters:  ?07/03/21 199 lb 3.2 oz (90.4 kg)  ?07/13/20 200 lb 6.4 oz (90.9 kg)  ?06/12/20 201 lb 8 oz (91.4 kg)  ?  ?Constitutional:   ?   Appearance: Healthy appearance. Not in distress.  ?Neck:  ?   Vascular: No JVR. JVD normal.  ?Pulmonary:  ?   Effort: Pulmonary effort is normal.  ?   Breath sounds: No wheezing. No rales.  ?Cardiovascular:  ?   Normal rate. Regular rhythm. Normal S1. Normal S2.   ?   Murmurs: There is a grade 2/6 systolic murmur at the URSB.  ?Edema: ?   Peripheral edema present. ?   Pretibial: bilateral trace edema of the pretibial area. ?Abdominal:  ?   Palpations: Abdomen is soft.  ?Skin: ?   General: Skin is warm and dry.  ?Neurological:  ?   Mental Status: Alert and oriented to person, place and time.  ?   Cranial Nerves: Cranial nerves are intact.  ?  ?    ?ASSESSMENT & PLAN:   ?(HFpEF) heart failure with preserved ejection fraction ([place]) ?Echo in 5/21 with EF 21-19 and mild diastolic dysfunction.   Overall, volume status is stable.  She is NYHA IIb.  Continue Lasix 20 mg daily. ? ?Essential hypertension ?Blood pressure high today.  She has not taking medications.  Continue Norvasc 10 mg daily, Lasix 20 mg daily.  I have asked her to monitor blood pressure over the next 2 weeks and let us know if it is 140/90  or higher. ? ?Hyperlipidemia LDL goal <70 ?Continue Lipitor 40 mg daily.  Arrange fasting CMET, lipids prior to next visit in 6 months ? ?Chronic obstructive airway disease with asthma ([place]) ?She was evaluated in urgent care last month with low O2 and cough.  She was placed on nebulizer and prednisone.  Her son purchased oxygen for her to wear at night after this.  She has not seen pulmonology in quite some time.  I have encouraged her to arrange follow-up ? ?Carotid artery disease ([place]) ?Ultrasound in August 2022 with bilateral ICA stenosis 40-59%.  Continue Lipitor 40 mg daily.  Plan follow-up US in August 2023. ? ?Aortic stenosis ?Mild aortic stenosis and mild mitral stenosis by echocardiogram May 2021.  Consider repeat echocardiogram in 1 to 2 years. ?  ?     ?   ?Dispo:  Return in about 6 months (around 01/03/2022) for Routine Follow Up, w/ Dr. Acie Fredrickson.  ? ?Medication Adjustments/Labs and Tests Ordered: ?Current medicines are reviewed at length with the patient today.  Concerns regarding medicines are outlined above.  ?Tests Ordered: ?Orders Placed This Encounter  ?Procedures  ? Comp Met (CMET)  ? Lipid panel  ? EKG 12-Lead  ? ?Medication Changes: ?Meds ordered this encounter  ?Medications  ? atorvastatin (LIPITOR) 40 MG tablet  ?  Sig: TAKE 1 TABLET(40 MG) BY MOUTH DAILY AT 6 PM. Please make overdue appt with Dr. Acie Fredrickson before anymore refills. Thank you 2nd attempt  ?  Dispense:  90 tablet  ?  Refill:  3  ? ?Signed, ?Richardson Dopp, PA-C  ?07/03/2021 4:17 PM    ?[facility] ?[address], [place], [place]  [address] ?Phone: [phone]; Fax: [phone]  ?

## 2021-07-03 ENCOUNTER — Encounter: Payer: Self-pay | Admitting: Physician Assistant

## 2021-07-03 ENCOUNTER — Encounter (INDEPENDENT_AMBULATORY_CARE_PROVIDER_SITE_OTHER): Payer: Self-pay

## 2021-07-03 ENCOUNTER — Ambulatory Visit: Payer: PPO | Admitting: Physician Assistant

## 2021-07-03 ENCOUNTER — Other Ambulatory Visit: Payer: Self-pay

## 2021-07-03 VITALS — BP 170/78 | HR 80 | Ht 65.0 in | Wt 199.2 lb

## 2021-07-03 DIAGNOSIS — I10 Essential (primary) hypertension: Secondary | ICD-10-CM

## 2021-07-03 DIAGNOSIS — I6523 Occlusion and stenosis of bilateral carotid arteries: Secondary | ICD-10-CM | POA: Diagnosis not present

## 2021-07-03 DIAGNOSIS — J4489 Other specified chronic obstructive pulmonary disease: Secondary | ICD-10-CM

## 2021-07-03 DIAGNOSIS — I5032 Chronic diastolic (congestive) heart failure: Secondary | ICD-10-CM

## 2021-07-03 DIAGNOSIS — E785 Hyperlipidemia, unspecified: Secondary | ICD-10-CM | POA: Diagnosis not present

## 2021-07-03 DIAGNOSIS — I350 Nonrheumatic aortic (valve) stenosis: Secondary | ICD-10-CM | POA: Insufficient documentation

## 2021-07-03 DIAGNOSIS — J449 Chronic obstructive pulmonary disease, unspecified: Secondary | ICD-10-CM | POA: Diagnosis not present

## 2021-07-03 DIAGNOSIS — I779 Disorder of arteries and arterioles, unspecified: Secondary | ICD-10-CM | POA: Insufficient documentation

## 2021-07-03 MED ORDER — ATORVASTATIN CALCIUM 40 MG PO TABS: ORAL_TABLET | ORAL | 3 refills | Status: DC

## 2021-07-03 NOTE — Assessment & Plan Note (Signed)
Mild aortic stenosis and mild mitral stenosis by echocardiogram May 2021.  Consider repeat echocardiogram in 1 to 2 years. ?

## 2021-07-03 NOTE — Assessment & Plan Note (Signed)
Continue Lipitor 40 mg daily.  Arrange fasting CMET, lipids prior to next visit in 6 months ?

## 2021-07-03 NOTE — Assessment & Plan Note (Addendum)
Blood pressure high today.  She has not taking medications.  Continue Norvasc 10 mg daily, Lasix 20 mg daily.  I have asked her to monitor blood pressure over the next 2 weeks and let us know if it is 140/90 or higher. ?

## 2021-07-03 NOTE — Patient Instructions (Addendum)
Medication Instructions:  ?Your physician recommends that you continue on your current medications as directed. Please refer to the Current Medication list given to you today. ? ?*If you need a refill on your cardiac medications before your next appointment, please call your pharmacy* ? ? ?Lab Work: ?12/21/21:  come to the office, anytime from 7:15-5:00, FASTING:  CMET & LIPID ? ?If you have labs (blood work) drawn today and your tests are completely normal, you will receive your results only by: ?MyChart Message (if you have MyChart) OR ?A paper copy in the mail ?If you have any lab test that is abnormal or we need to change your treatment, we will call you to review the results. ? ? ?Testing/Procedures: ?None ordered ? ? ?Follow-Up: ?At [facility], you and your health needs are our priority.  As part of our continuing mission to provide you with exceptional heart care, we have created designated Provider Care Teams.  These Care Teams include your primary Cardiologist (physician) and Advanced Practice Providers (APPs -  Physician Assistants and Nurse Practitioners) who all work together to provide you with the care you need, when you need it. ? ?We recommend signing up for the patient portal called "MyChart".  Sign up information is provided on this After Visit Summary.  MyChart is used to connect with patients for Virtual Visits (Telemedicine).  Patients are able to view lab/test results, encounter notes, upcoming appointments, etc.  Non-urgent messages can be sent to your provider as well.   ?To learn more about what you can do with MyChart, go to [email or web address].   ? ?Your next appointment:   ?6 month(s) ? ?The format for your next appointment:   ?In Person ? ?Provider:   ?Mertie Moores, MD  or Richardson Dopp, PA-C       ? ? ?Other Instructions ?Check your blood pressure over the next couple of weeks, let us know if it is running higher than 140/90 ?

## 2021-07-03 NOTE — Assessment & Plan Note (Signed)
She was evaluated in urgent care last month with low O2 and cough.  She was placed on nebulizer and prednisone.  Her son purchased oxygen for her to wear at night after this.  She has not seen pulmonology in quite some time.  I have encouraged her to arrange follow-up ?

## 2021-07-03 NOTE — Assessment & Plan Note (Signed)
Echo in 5/21 with EF 62-86 and mild diastolic dysfunction.  Overall, volume status is stable.  She is NYHA IIb.  Continue Lasix 20 mg daily. ?

## 2021-07-03 NOTE — Assessment & Plan Note (Signed)
Ultrasound in August 2022 with bilateral ICA stenosis 40-59%.  Continue Lipitor 40 mg daily.  Plan follow-up US in August 2023. ?

## 2021-08-15 ENCOUNTER — Encounter (INDEPENDENT_AMBULATORY_CARE_PROVIDER_SITE_OTHER): Payer: PPO | Admitting: Ophthalmology

## 2021-08-15 DIAGNOSIS — H43813 Vitreous degeneration, bilateral: Secondary | ICD-10-CM

## 2021-08-15 DIAGNOSIS — I10 Essential (primary) hypertension: Secondary | ICD-10-CM | POA: Diagnosis not present

## 2021-08-15 DIAGNOSIS — H35033 Hypertensive retinopathy, bilateral: Secondary | ICD-10-CM | POA: Diagnosis not present

## 2021-08-15 DIAGNOSIS — H353221 Exudative age-related macular degeneration, left eye, with active choroidal neovascularization: Secondary | ICD-10-CM

## 2021-08-15 DIAGNOSIS — H353114 Nonexudative age-related macular degeneration, right eye, advanced atrophic with subfoveal involvement: Secondary | ICD-10-CM

## 2021-08-22 ENCOUNTER — Ambulatory Visit: Payer: PPO | Admitting: Emergency Medicine

## 2021-09-11 DIAGNOSIS — H353114 Nonexudative age-related macular degeneration, right eye, advanced atrophic with subfoveal involvement: Secondary | ICD-10-CM | POA: Diagnosis not present

## 2021-09-11 DIAGNOSIS — H524 Presbyopia: Secondary | ICD-10-CM | POA: Diagnosis not present

## 2021-09-19 ENCOUNTER — Ambulatory Visit: Payer: PPO | Admitting: Emergency Medicine

## 2021-09-19 ENCOUNTER — Encounter: Payer: Self-pay | Admitting: Emergency Medicine

## 2021-09-19 DIAGNOSIS — R490 Dysphonia: Secondary | ICD-10-CM

## 2021-09-19 DIAGNOSIS — J449 Chronic obstructive pulmonary disease, unspecified: Secondary | ICD-10-CM

## 2021-09-19 MED ORDER — FAMOTIDINE 20 MG PO TABS: 20.0000 mg | ORAL_TABLET | Freq: Two times a day (BID) | ORAL | 0 refills | Status: DC

## 2021-09-19 MED ORDER — PREDNISONE 10 MG PO TABS: 20.0000 mg | ORAL_TABLET | Freq: Every day | ORAL | 0 refills | Status: DC

## 2021-09-19 NOTE — Assessment & Plan Note (Signed)
Mild asthma.  She has not had any dyspnea.  Her flares are rarely consistent with bronchospasm, usually upper airway.  No indication for maintenance BD therapy at this time.

## 2021-09-19 NOTE — Addendum Note (Signed)
Addended by: Gavin Potters R on: 09/19/2021 10:48 AM   Modules accepted: Orders

## 2021-09-19 NOTE — Progress Notes (Signed)
Subjective:    Patient ID: Sandra Cuevas, female    DOB: [birth year], 88 y.o.   MRN: [id number]  HPI  ROV 10/08/2018 --Sandra Cuevas is 33, has a history of upper airway irritation syndrome and fixed asthma in the setting of GERD with a hiatal hernia.  We have also been following a 6 mm right lower lobe pulmonary nodule.  Her most recent CT was done 07/14/2016 and we decided not to repeat at this time.  At her last visit we stopped Symbicort to see if she would tolerate, started guaifenesin given symptoms of increased throat irritation and cough. She hasn't missed it. She states that she has been doing well. She is having some constipation. She has chronic hoarseness, unchanged. She is not having SOB. She never uses albuterol, never needs it.   On screening eval today her T was 99.9F. she states that she feels well. She travelled to to the [place] with her daughter and grandchildren last week. No one else has been sick. No reason to believe she has a viral process.   ROV 09/19/2021 --Sandra Cuevas is a 88.  I have seen her in the past for fixed asthma and upper airway irritation syndrome with some associated chronic cough.  She has GERD with a hiatal hernia that is an exacerbating factor.  She deals with chronic hoarseness - usually off/on.  She began to experience increased hoarseness and cough about 2 days ago. No new contacts, no URI symptoms.  She is on singulair, fluticasone. No dyspnea. She has not needed her albuterol   Review of Systems As per HPI      Objective:   Physical Exam Vitals:   09/19/21 1020  BP: 116/70  Pulse: 67  Temp: (!) 97.5 F (36.4 C)  TempSrc: Oral  SpO2: 94%  Weight: 202 lb 9.6 oz (91.9 kg)  Height: 5' 5.5" (1.664 m)   Gen: Pleasant, well-nourished, in no distress,  normal affect  ENT: No lesions,  mouth clear,  oropharynx clear, very hoarse gravelly voice  Neck: No JVD, no stridor but she is hoarse.   Lungs: No use of accessory muscles, clear without rales or  rhonchi, no wheeze.   Cardiovascular: RRR, heart sounds normal, no murmur or gallops, no peripheral edema  Musculoskeletal: No deformities  Neuro: alert, non focal  Skin: Warm, no lesions or rashes      Assessment & Plan:  Hoarse voice quality She is having some significant flaring of her upper airway irritation and hoarseness, some associated cough.  Unclear trigger.  She does not have URI symptoms and otherwise feels well.  She has not had dyspnea or needed her albuterol.  She has tried gargling with warm salt water.  I will try to treat for acute upper airway flare, temporarily increase her Pepcid.  I will have her come back in 2 weeks to assess progress.  Question whether she may need an ENT evaluation if the symptoms persist.  Please temporarily increase your Pepcid to 20 mg twice a day for 1 week.  Then you can go back to taking it once daily. Take prednisone 20 mg daily for 3 days and then stop. Continue your fluticasone nasal spray 2 sprays each nostril once daily.  Try to keep this spray off your throat if possible. Keep albuterol available to use 2 puffs if needed for shortness of breath, chest tightness, wheezing. Follow with APP in 2 weeks so we can assess your progress and symptoms Follow Dr. Lamonte Sakai  in 1 year or sooner if you have any problems.  Chronic obstructive airway disease with asthma (HCC) Mild asthma.  She has not had any dyspnea.  Her flares are rarely consistent with bronchospasm, usually upper airway.  No indication for maintenance BD therapy at this time.  Baltazar Apo, MD, PhD 09/19/2021, 10:42 AM [facility] [phone] or if no answer [phone]

## 2021-09-19 NOTE — Assessment & Plan Note (Signed)
She is having some significant flaring of her upper airway irritation and hoarseness, some associated cough.  Unclear trigger.  She does not have URI symptoms and otherwise feels well.  She has not had dyspnea or needed her albuterol.  She has tried gargling with warm salt water.  I will try to treat for acute upper airway flare, temporarily increase her Pepcid.  I will have her come back in 2 weeks to assess progress.  Question whether she may need an ENT evaluation if the symptoms persist.  Please temporarily increase your Pepcid to 20 mg twice a day for 1 week.  Then you can go back to taking it once daily. Take prednisone 20 mg daily for 3 days and then stop. Continue your fluticasone nasal spray 2 sprays each nostril once daily.  Try to keep this spray off your throat if possible. Keep albuterol available to use 2 puffs if needed for shortness of breath, chest tightness, wheezing. Follow with APP in 2 weeks so we can assess your progress and symptoms Follow Dr. Lamonte Sakai in 1 year or sooner if you have any problems.

## 2021-09-19 NOTE — Patient Instructions (Signed)
Please temporarily increase your Pepcid to 20 mg twice a day for 1 week.  Then you can go back to taking it once daily. Take prednisone 20 mg daily for 3 days and then stop. Continue your fluticasone nasal spray 2 sprays each nostril once daily.  Try to keep this spray off your throat if possible. Keep albuterol available to use 2 puffs if needed for shortness of breath, chest tightness, wheezing. Follow with APP in 2 weeks so we can assess your progress and symptoms Follow Dr. Lamonte Sakai in 1 year or sooner if you have any problems.

## 2021-10-03 ENCOUNTER — Ambulatory Visit: Payer: PPO | Admitting: Nurse Practitioner

## 2021-10-03 ENCOUNTER — Encounter: Payer: Self-pay | Admitting: Nurse Practitioner

## 2021-10-03 VITALS — BP 154/60 | HR 68 | Temp 98.3°F | Ht 66.0 in | Wt 198.8 lb

## 2021-10-03 DIAGNOSIS — J449 Chronic obstructive pulmonary disease, unspecified: Secondary | ICD-10-CM

## 2021-10-03 DIAGNOSIS — R1310 Dysphagia, unspecified: Secondary | ICD-10-CM | POA: Diagnosis not present

## 2021-10-03 DIAGNOSIS — K219 Gastro-esophageal reflux disease without esophagitis: Secondary | ICD-10-CM

## 2021-10-03 DIAGNOSIS — R490 Dysphonia: Secondary | ICD-10-CM | POA: Diagnosis not present

## 2021-10-03 DIAGNOSIS — R058 Other specified cough: Secondary | ICD-10-CM | POA: Diagnosis not present

## 2021-10-03 LAB — BASIC METABOLIC PANEL
BUN: 17 mg/dL (ref 6–23)
CO2: 35 mEq/L — ABNORMAL HIGH (ref 19–32)
Calcium: 10 mg/dL (ref 8.4–10.5)
Chloride: 99 mEq/L (ref 96–112)
Creatinine, Ser: 1.15 mg/dL (ref 0.40–1.20)
GFR: 41.73 mL/min — ABNORMAL LOW (ref >60.00)
Glucose, Bld: 138 mg/dL — ABNORMAL HIGH (ref 70–99)
Potassium: 4.5 mEq/L (ref 3.5–5.1)
Sodium: 139 mEq/L (ref 135–145)

## 2021-10-03 MED ORDER — OMEPRAZOLE 20 MG PO CPDR: 20.0000 mg | DELAYED_RELEASE_CAPSULE | Freq: Every day | ORAL | 5 refills | Status: DC

## 2021-10-03 NOTE — Assessment & Plan Note (Signed)
She has persistent hoarseness, previously suspected to be related to upper airway irritation with uncontrolled GERD; however, she has not had any relief with prednisone or increase pepcid dose. She also has tenderness to the left side of her neck and a questionable small nodule to her left thyroid on palpation today. With her constellation of symptoms, I think we should obtain CT of her neck for further evaluation and to rule out any masses. I have also sent an urgent referral to ENT for evaluation of her hoarseness.   Patient Instructions  Continue Albuterol inhaler 2 puffs every 6 hours as needed for shortness of breath or wheezing. Notify if symptoms persist despite rescue inhaler/neb use. Continue pepcid 20 mg Twice daily for reflux Continue flonase nasal spray 2 sprays each nostril daily Continue singulair 10 mg At bedtime   Omeprazole 20 mg daily 30 minutes prior to breakfast for reflux  CT of the neck - someone will contact you for scheduling  Labs today - BMET  Referral to Ear, nose and throat specialist  Referral to gastroenterologist   Follow up in one month with Dr. Lamonte Sakai or Alanson Aly. If symptoms do not improve or worsen, please contact office for sooner follow up or seek emergency care.

## 2021-10-03 NOTE — Progress Notes (Signed)
$'@Patient'n$  ID: Sandra Cuevas, female    DOB: Jun 30, 1928, 88 y.o.   MRN: [id number]  Chief Complaint  Patient presents with   Follow-up    Follow up. Patient says her throat is still sore and the medicine isn't helping that much.      Referring provider: Sharilyn Sites, MD  HPI: 88 year old female, former smoker followed for COPD/asthma and upper airway cough syndrome. She is a patient of Dr. Agustina Caroli and last seen in office on 09/19/2021. Past medical history significant for HTN, HFpEF, CAD, AS, GERD, hypothyroid, DM, CKD stage III, HLD.   TEST/EVENTS:  2015 PFTs: FVC 90, FEV1 75, ratio 62, DLCOunc 67. No BD. Moderate obstructive airway disease with moderate diffusion defect  09/19/2021: OV with Dr. Lamonte Sakai. Flaring of upper airway irritation and hoarseness with associated cough. Increased pepcid. Treated with prednisone burst. May need ENT eval if symptoms persist. Continue PRN albuterol - asthma mild and intermittent.  10/03/2021: Today - follow up Patient presents today with daughter for follow up. She continues to have persistent hoarseness of her voice; no change with prednisone. She is also having tenderness to the left side of her neck, sometimes with swallowing and sometimes just to the touch. She continues to have belching and feelings of fullness after eating; felt like the increase in pepcid helped minimally. She also feels like food, especially pills, get stuck with swallowing at times. Her cough is minimal and unchanged, sometimes with clear phlegm. Breathing is stable without any increased SOB. She has not noticed any wheezing. She denies hemoptysis, bloody stools, changes in bowel habits, abd pain, N/V, fevers, weight loss, headaches, dizziness. Hasn't had to use albuterol in quite some time. She takes singulair and uses flonase for allergies with good control.  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 "   COPD (chronic obstructive pulmonary disease) (HCC)    Diabetes mellitus    GERD (gastroesophageal reflux disease)    History of cardiovascular stress test 06/01/2010   EF 71% - no evidence of ischemia, normal left ventricular systolic function   History of hysterectomy 1965   Hyperlipidemia    Hypertension    Hypothyroidism    Shortness of breath    on exertion   Tubular adenoma of colon     Tobacco History: Social History   Tobacco Use  Smoking Status Never  Smokeless Tobacco Never   Counseling given: Not Answered   Outpatient Medications Prior to Visit  Medication Sig Dispense Refill   acetaminophen (TYLENOL) 500 MG tablet Take 2 tablets (1,000 mg total) by mouth every 6 (six) hours as needed. 30 tablet 0   albuterol (PROVENTIL) (2.5 MG/3ML) 0.083% nebulizer solution Take 3 mLs (2.5 mg total) by nebulization every 6 (six) hours as needed for wheezing or shortness of breath. 75 mL 0   amLODipine (NORVASC) 10 MG tablet Take 1 tablet (10 mg total) by mouth daily. 30 tablet 0   atorvastatin (LIPITOR) 40 MG tablet TAKE 1 TABLET(40 MG) BY MOUTH DAILY AT 6 PM. Please  make overdue appt with Dr. Acie Fredrickson before anymore refills. Thank you 2nd attempt 90 tablet 3   famotidine (PEPCID) 20 MG tablet Take 1 tablet by mouth daily in the afternoon.     famotidine (PEPCID) 20 MG tablet Take 1 tablet (20 mg total) by mouth 2 (two) times daily. 7 tablet 0   fluticasone (FLONASE) 50 MCG/ACT nasal spray SHAKE LIQUID AND USE 2 SPRAYS IN EACH NOSTRIL DAILY 16 g 5   gabapentin (NEURONTIN) 600 MG tablet Take 600 mg by mouth 3 (three) times daily.      levothyroxine (SYNTHROID, LEVOTHROID) 50 MCG tablet Take 50 mcg by mouth daily.     montelukast (SINGULAIR) 10 MG tablet Take 1 tablet by mouth daily in the afternoon.     Multiple Vitamins-Minerals (PRESERVISION AREDS 2+MULTI VIT PO) Take 1 tablet by mouth daily.     Polyvinyl Alcohol-Povidone 5-6 MG/ML SOLN Place 1 drop into both eyes daily as needed (Dry Eyes).     potassium chloride (MICRO-K) 10 MEQ CR capsule Take 10 mEq by mouth daily.      traMADol (ULTRAM) 50 MG tablet Take 50 mg by mouth every 4 (four) hours as needed for pain.     zolpidem (AMBIEN) 10 MG tablet Take 5 mg by mouth at bedtime as needed for sleep.      furosemide (LASIX) 20 MG tablet Take 1 tablet (20 mg total) by mouth daily. 30 tablet 0   predniSONE (DELTASONE) 10 MG tablet Take 2 tablets (20 mg total) by mouth daily with breakfast. (Patient not taking: Reported on 10/03/2021) 6 tablet 0   No facility-administered medications prior to visit.     Review of Systems:   Constitutional: No weight loss or gain, night sweats, fevers, chills, fatigue, or lassitude. HEENT: No headaches, tooth/dental problems, or sore throat. No sneezing, itching, ear ache, nasal congestion, or post nasal drip. +difficulty swallowing at times, hoarse voice CV:  No chest pain, orthopnea, PND, swelling in lower extremities, anasarca, dizziness, palpitations, syncope Resp: +minimal cough with clear sputum. No shortness of breath with exertion or at rest. No excess mucus or change in color of mucus. No hemoptysis. No wheezing.  No chest wall deformity GI:  +indigestion. No abdominal pain, nausea, vomiting, diarrhea, change in bowel habits, loss of appetite, bloody stools.  GU: No dysuria, change in color of urine, urgency or frequency.  No flank pain, no hematuria  Skin: No rash, lesions, ulcerations MSK:  No joint pain or swelling.  No decreased range of motion.  No back pain. Neuro: No dizziness or lightheadedness.  Psych: No depression or  anxiety. Mood stable.     Physical Exam:  BP (!) 154/60 (BP Location: Right Arm, Patient Position: Sitting, Cuff Size: Normal)   Pulse 68   Temp 98.3 F (36.8 C) (Oral)   Ht '5\' 6"'$  (1.676 m)   Wt 198 lb 12.8 oz (90.2 kg)   SpO2 94%   BMI 32.09 kg/m   GEN: Pleasant, interactive, well-appearing; elderly; obese; in no acute distress HEENT:  Normocephalic and atraumatic. EACs patent bilaterally. TM pearly gray with present light reflex bilaterally. PERRLA. Sclera white. Nasal turbinates pink, moist and patent bilaterally. No rhinorrhea present. Oropharynx erythematous and moist, without exudate or edema. No lesions, ulcerations  NECK:  Supple w/ fair ROM. Tenderness upon palpation to left neck. No JVD present. Normal carotid impulses w/o bruits. Thyroid symmetrical with no goiter. Question nodule on the left side. No lymphadenopathy.   CV: RRR, systolic murmur, no  peripheral edema. Pulses intact, +2 bilaterally. No cyanosis, pallor or clubbing. PULMONARY:  Unlabored, regular breathing. Clear bilaterally A&P w/o wheezes/rales/rhonchi. No accessory muscle use. No dullness to percussion. GI: BS present and normoactive. Soft, non-tender to palpation. No organomegaly or masses detected. No CVA tenderness. MSK: No erythema, warmth or tenderness. Cap refil <2 sec all extrem. No deformities or joint swelling noted.  Neuro: A/Ox3. No focal deficits noted.   Skin: Warm, no lesions or rashe Psych: Normal affect and behavior. Judgement and thought content appropriate.     Lab Results:  CBC    Component Value Date/Time   WBC 8.3 08/11/2019 0313   RBC 2.87 (L) 08/11/2019 0313   HGB 8.8 (L) 08/11/2019 0313   HCT 28.2 (L) 08/11/2019 0313   PLT 176 08/11/2019 0313   MCV 98.3 08/11/2019 0313   MCH 30.7 08/11/2019 0313   MCHC 31.2 08/11/2019 0313   RDW 14.4 08/11/2019 0313   LYMPHSABS 1.1 08/08/2019 1559   MONOABS 0.9 08/08/2019 1559   EOSABS 0.1 08/08/2019 1559   BASOSABS 0.0 08/08/2019 1559     BMET    Component Value Date/Time   NA 141 08/12/2019 0253   NA 144 09/23/2017 0849   K 4.3 08/12/2019 0253   CL 96 (L) 08/12/2019 0253   CO2 37 (H) 08/12/2019 0253   GLUCOSE 96 08/12/2019 0253   BUN 23 08/12/2019 0253   BUN 15 09/23/2017 0849   CREATININE 1.16 (H) 08/12/2019 0253   CREATININE 1.22 (H) 04/18/2015 1022   CALCIUM 8.8 (L) 08/12/2019 0253   GFRNONAA 42 (L) 08/12/2019 0253   GFRAA 48 (L) 08/12/2019 0253    BNP    Component Value Date/Time   BNP 180.0 (H) 08/10/2019 1435     Imaging:  No results found.       Latest Ref Rng & Units 04/20/2013    1:04 PM  PFT Results  FVC-Pre L 2.41   FVC-Predicted Pre % 90   FVC-Post L 2.33   FVC-Predicted Post % 87   Pre FEV1/FVC % % 62   Post FEV1/FCV % % 62   FEV1-Pre L 1.51   FEV1-Predicted Pre % 75   FEV1-Post L 1.45   DLCO uncorrected ml/min/mmHg 17.82   DLCO UNC% % 67   DLVA Predicted % 78     No results found for: "NITRICOXIDE"      Assessment & Plan:   Hoarse voice quality She has persistent hoarseness, previously suspected to be related to upper airway irritation with uncontrolled GERD; however, she has not had any relief with prednisone or increase pepcid dose. She also has tenderness to the left side of her neck and a questionable small nodule to her left thyroid on palpation today. With her constellation of symptoms, I think we should obtain CT of her neck for further evaluation and to rule out any masses. I have also sent an urgent referral to ENT for evaluation of her hoarseness.   Patient Instructions  Continue Albuterol inhaler 2 puffs every 6 hours as needed for shortness of breath or wheezing. Notify if symptoms persist despite rescue inhaler/neb use. Continue pepcid 20 mg Twice daily for reflux Continue flonase nasal spray 2 sprays each nostril daily Continue singulair 10 mg At bedtime   Omeprazole 20 mg daily 30 minutes prior to breakfast for reflux  CT of the neck - someone will  contact you for scheduling  Labs today - BMET  Referral to Ear, nose and throat specialist  Referral to  gastroenterologist   Follow up in one month with Dr. Lamonte Sakai or Alanson Aly. If symptoms do not improve or worsen, please contact office for sooner follow up or seek emergency care.     GERD (gastroesophageal reflux disease) Could be contributing to her hoarse voice but neck pain warrants further workup. Remains poorly controlled. Little improvement with increased pepcid dosing. Advised we should trial addition of PPI. She does have intermittent dysphagia as well. We will send her to GI for further evaluation.   Chronic obstructive airway disease with asthma ([place]) Appears compensated on exam without dyspnea or bronchospasm. Her symptoms seem unrelated to her pulmonary disease. Rarely requires albuterol - continue PRN.   Upper airway cough syndrome Optimize GERD control. Continue flonase for postnasal drainage control. Further workup warranted given length of symptoms >2 weeks and neck pain. See above plan.   I spent 35 minutes of dedicated to the care of this patient on the date of this encounter to include pre-visit review of records, face-to-face time with the patient discussing conditions above, post visit ordering of testing, clinical documentation with the electronic health record, making appropriate referrals as documented, and communicating necessary findings to members of the patients care team.  Sandra Bibles, NP 10/03/2021  Pt aware and understands NP's role.

## 2021-10-03 NOTE — Assessment & Plan Note (Signed)
Appears compensated on exam without dyspnea or bronchospasm. Her symptoms seem unrelated to her pulmonary disease. Rarely requires albuterol - continue PRN.

## 2021-10-03 NOTE — Patient Instructions (Addendum)
Continue Albuterol inhaler 2 puffs every 6 hours as needed for shortness of breath or wheezing. Notify if symptoms persist despite rescue inhaler/neb use. Continue pepcid 20 mg Twice daily for reflux Continue flonase nasal spray 2 sprays each nostril daily Continue singulair 10 mg At bedtime   Omeprazole 20 mg daily 30 minutes prior to breakfast for reflux  CT of the neck - someone will contact you for scheduling  Labs today - BMET  Referral to Ear, nose and throat specialist  Referral to gastroenterologist   Follow up in one month with Dr. Lamonte Sakai or Alanson Aly. If symptoms do not improve or worsen, please contact office for sooner follow up or seek emergency care.

## 2021-10-03 NOTE — Assessment & Plan Note (Signed)
Could be contributing to her hoarse voice but neck pain warrants further workup. Remains poorly controlled. Little improvement with increased pepcid dosing. Advised we should trial addition of PPI. She does have intermittent dysphagia as well. We will send her to GI for further evaluation.

## 2021-10-03 NOTE — Assessment & Plan Note (Signed)
Optimize GERD control. Continue flonase for postnasal drainage control. Further workup warranted given length of symptoms >2 weeks and neck pain. See above plan.

## 2021-10-15 ENCOUNTER — Other Ambulatory Visit: Payer: Self-pay | Admitting: Nurse Practitioner

## 2021-10-15 DIAGNOSIS — R1310 Dysphagia, unspecified: Secondary | ICD-10-CM

## 2021-10-15 DIAGNOSIS — R490 Dysphonia: Secondary | ICD-10-CM

## 2021-10-16 ENCOUNTER — Ambulatory Visit (HOSPITAL_COMMUNITY): Payer: PPO

## 2021-10-16 ENCOUNTER — Ambulatory Visit (HOSPITAL_COMMUNITY)
Admission: RE | Admit: 2021-10-16 | Discharge: 2021-10-16 | Disposition: A | Discharge status: Home / Self Care | Payer: PPO | Source: Ambulatory Visit | Attending: Nurse Practitioner | Admitting: Nurse Practitioner

## 2021-10-16 DIAGNOSIS — Z8542 Personal history of malignant neoplasm of other parts of uterus: Secondary | ICD-10-CM | POA: Diagnosis not present

## 2021-10-16 DIAGNOSIS — R1310 Dysphagia, unspecified: Secondary | ICD-10-CM | POA: Insufficient documentation

## 2021-10-16 DIAGNOSIS — R490 Dysphonia: Secondary | ICD-10-CM | POA: Insufficient documentation

## 2021-10-16 DIAGNOSIS — R531 Weakness: Secondary | ICD-10-CM | POA: Diagnosis not present

## 2021-10-16 MED ORDER — IOHEXOL 300 MG/ML  SOLN
60.0000 mL | Freq: Once | INTRAMUSCULAR | Status: AC | PRN
  Administered 2021-10-16: 60 mL via INTRAVENOUS

## 2021-10-17 ENCOUNTER — Telehealth: Payer: Self-pay

## 2021-10-17 NOTE — Telephone Encounter (Signed)
Received call report from [facility] with [place] radiology on CT neck.  Katie, please advise. Thanks   2. Mild asymmetric soft tissue prominence in the region of the right palatine tonsil and glossotonsillar sulcus, nonspecific. Given the provided history, consider ENT consultation and direct visualization to exclude an oropharyngeal mass at this site.

## 2021-10-17 NOTE — Telephone Encounter (Signed)
Attempted to contact patient to review CT scan results. There is some concern for possible oropharyngeal mass with soft tissue prominence on her CT. She needs to be seen by ENT for further evaluation. I previously had sent an urgent referral to them due to her long standing issues with hoarseness. Please ensure she has heard from [facility] to scheduled an appointment and that it is within the next few weeks.   She also had evidence of chronic paranasal sinusitis. She is on flonase and takes singulair regularly. There was also a possible tiny salivary stone in left submandibular gland, possibly contributing to the pain on this side. These are things that can be further assessed/managed by ENT as well.   She has plaque buildup in her carotid arteries and needs further evaluation of these. Please advise her to contact her PCP to schedule her for carotid artery ultrasound (duplex) and discuss next steps.   Thanks!

## 2021-10-17 NOTE — Telephone Encounter (Signed)
Lm for patient.  

## 2021-10-29 ENCOUNTER — Encounter (HOSPITAL_COMMUNITY): Payer: Self-pay

## 2021-10-29 ENCOUNTER — Encounter: Payer: Self-pay | Admitting: *Deleted

## 2021-10-29 NOTE — Telephone Encounter (Signed)
Called pt on her mobile number and still no answer- [email or web address]   This looks like it's actually her daughter's number  I called the pt's home number and went over results with her  She verbalized understanding  She has not been contacted by ENT yet  I advised if not heard from them by end of the wk to call us and let us know  She verbalized understanding

## 2021-11-05 ENCOUNTER — Telehealth: Payer: Self-pay | Admitting: Nurse Practitioner

## 2021-11-05 NOTE — Telephone Encounter (Signed)
Patients daughter is calling in reference to ENT referall that was placed.

## 2021-11-12 NOTE — Telephone Encounter (Signed)
Called and spoke with daughter and gave her the name and number of the ENT referral location. Nothing further needed

## 2021-11-12 NOTE — Telephone Encounter (Signed)
Please check with PCC's. I placed the order at her last OV. Usually it's [facility] so they can also call their office and see if they have received it. Thanks!

## 2021-11-14 ENCOUNTER — Ambulatory Visit: Payer: PPO | Admitting: Emergency Medicine

## 2021-11-14 ENCOUNTER — Encounter: Payer: Self-pay | Admitting: Emergency Medicine

## 2021-11-14 DIAGNOSIS — J449 Chronic obstructive pulmonary disease, unspecified: Secondary | ICD-10-CM

## 2021-11-14 DIAGNOSIS — R490 Dysphonia: Secondary | ICD-10-CM

## 2021-11-14 NOTE — Assessment & Plan Note (Signed)
She has a hiatal hernia, unclear how much GERD she actually feels.  Omeprazole was added to famotidine but her voice quality has not changed.  She is on fluticasone nasal spray.  I think the most important next step is for her to follow-up with ENT as planned to rule out structural abnormality.  If we need to adjust her GERD therapy at that time we can do so.  She is also complaining of some dysphagia, question whether this plays into the GERD as well.  She was going to be seen by gastroenterology but not clear to me that the referral was ever made.  Will take care of this today.  Please continue your famotidine and omeprazole as you have been taking them. Please continue fluticasone nasal spray Keep your albuterol available to use either 1 nebulizer treatment or 2 puffs if you need it for shortness of breath Keep your ENT appointment as planned to evaluate your hoarseness We will confirm referral to gastroenterology to evaluate your swallowing difficulties and also possible reflux from your hiatal hernia. Follow with Sandra Cuevas in 6 months or sooner if you have any problems

## 2021-11-14 NOTE — Progress Notes (Signed)
Subjective:    Patient ID: Sandra Cuevas, female    DOB: 13-May-1928, 88 y.o.   MRN: [id number]  HPI  ROV 09/19/2021 --Sandra Cuevas is a 88.  I have seen her in the past for fixed asthma and upper airway irritation syndrome with some associated chronic cough.  She has GERD with a hiatal hernia that is an exacerbating factor.  She deals with chronic hoarseness - usually off/on.  She began to experience increased hoarseness and cough about 2 days ago. No new contacts, no URI symptoms.  She is on singulair, fluticasone. No dyspnea. She has not needed her albuterol  ROV 11/14/2021 --follow-up visit for 88 year old woman with history of chronic cough in the setting of upper airway irritation syndrome and suspected fixed asthma.  She has GERD with a hiatal hernia that is a clear exacerbating factor, deals with some dysphagia, mid chest discomfort, chronic hoarseness.  I increased her Pepcid in June, then PPI was added by K Cobb and GI evaluation was recommended.  She is also on fluticasone nasal spray.  She has seen Dr. Hilma Favors with ENT  - . Today she reports that she continues to have hoarseness. Short prednisone course did not help her. She is having some trouble getting mucous out of her throat in the am. Denies any overt GERD sx today. She is dealing with hip and back pain.  She uses albuterol very rarely.    Review of Systems As per HPI      Objective:   Physical Exam Vitals:   11/14/21 1123  BP: 124/68  Pulse: 70  Temp: 97.9 F (36.6 C)  TempSrc: Oral  SpO2: 98%  Weight: 201 lb 3.2 oz (91.3 kg)  Height: '5\' 6"'$  (1.676 m)   Gen: Pleasant, well-nourished, in no distress,  normal affect  ENT: No lesions,  mouth clear,  oropharynx clear, very hoarse gravelly voice  Neck: No JVD, no stridor but she is hoarse.   Lungs: No use of accessory muscles, clear without rales or rhonchi, no wheeze.   Cardiovascular: RRR, heart sounds normal, no murmur or gallops, no peripheral  edema  Musculoskeletal: No deformities  Neuro: alert, non focal  Skin: Warm, no lesions or rashes      Assessment & Plan:  Chronic obstructive airway disease with asthma (HCC) No wheeze on exam.  Minimal albuterol use.  I think that her symptoms are principally upper airway in nature.  She does have albuterol available to use if needed.  Hoarse voice quality She has a hiatal hernia, unclear how much GERD she actually feels.  Omeprazole was added to famotidine but her voice quality has not changed.  She is on fluticasone nasal spray.  I think the most important next step is for her to follow-up with ENT as planned to rule out structural abnormality.  If we need to adjust her GERD therapy at that time we can do so.  She is also complaining of some dysphagia, question whether this plays into the GERD as well.  She was going to be seen by gastroenterology but not clear to me that the referral was ever made.  Will take care of this today.  Please continue your famotidine and omeprazole as you have been taking them. Please continue fluticasone nasal spray Keep your albuterol available to use either 1 nebulizer treatment or 2 puffs if you need it for shortness of breath Keep your ENT appointment as planned to evaluate your hoarseness We will confirm referral to gastroenterology  to evaluate your swallowing difficulties and also possible reflux from your hiatal hernia. Follow with Dr Lamonte Sakai in 6 months or sooner if you have any problems  Baltazar Apo, MD, PhD 11/14/2021, 11:42 AM [facility] [phone] or if no answer [phone]

## 2021-11-14 NOTE — Assessment & Plan Note (Signed)
No wheeze on exam.  Minimal albuterol use.  I think that her symptoms are principally upper airway in nature.  She does have albuterol available to use if needed.

## 2021-11-14 NOTE — Patient Instructions (Signed)
Please continue your famotidine and omeprazole as you have been taking them. Please continue fluticasone nasal spray Keep your albuterol available to use either 1 nebulizer treatment or 2 puffs if you need it for shortness of breath Keep your ENT appointment as planned to evaluate your hoarseness We will confirm referral to gastroenterology to evaluate your swallowing difficulties and also possible reflux from your hiatal hernia. Follow with Dr Lamonte Sakai in 6 months or sooner if you have any problems

## 2021-11-15 ENCOUNTER — Ambulatory Visit (HOSPITAL_COMMUNITY)
Admission: RE | Admit: 2021-11-15 | Discharge: 2021-11-15 | Disposition: A | Discharge status: Home / Self Care | Payer: PPO | Source: Ambulatory Visit | Attending: Cardiology | Admitting: Cardiology

## 2021-11-15 DIAGNOSIS — I6529 Occlusion and stenosis of unspecified carotid artery: Secondary | ICD-10-CM | POA: Diagnosis not present

## 2021-11-15 DIAGNOSIS — E785 Hyperlipidemia, unspecified: Secondary | ICD-10-CM | POA: Diagnosis not present

## 2021-11-15 DIAGNOSIS — R0989 Other specified symptoms and signs involving the circulatory and respiratory systems: Secondary | ICD-10-CM

## 2021-11-15 DIAGNOSIS — I779 Disorder of arteries and arterioles, unspecified: Secondary | ICD-10-CM

## 2021-11-15 DIAGNOSIS — I6523 Occlusion and stenosis of bilateral carotid arteries: Secondary | ICD-10-CM | POA: Diagnosis not present

## 2021-11-19 ENCOUNTER — Other Ambulatory Visit (HOSPITAL_COMMUNITY): Payer: Self-pay | Admitting: Cardiovascular Disease

## 2021-11-19 DIAGNOSIS — I6523 Occlusion and stenosis of bilateral carotid arteries: Secondary | ICD-10-CM

## 2021-11-20 ENCOUNTER — Telehealth: Payer: Self-pay

## 2021-11-20 DIAGNOSIS — I779 Disorder of arteries and arterioles, unspecified: Secondary | ICD-10-CM

## 2021-11-20 DIAGNOSIS — I771 Stricture of artery: Secondary | ICD-10-CM

## 2021-11-20 NOTE — Telephone Encounter (Signed)
Spoke with daughter [name] on [place] in detail about the results and need to do repeat study in 1 year. Order placed at this time for future carotid US.

## 2021-11-20 NOTE — Telephone Encounter (Signed)
-----   Message from Thayer Headings, MD sent at 11/18/2021  9:11 PM EDT ----- R internal carotid - mod stenosis L  carotid - > 50% stenosis in the L common carotid artery  L subclavian stenosis.  Findings are worrisome for subclavian steal syndrome.  Repeat carotid / subclavian duplex in 1 year

## 2021-12-21 ENCOUNTER — Ambulatory Visit: Payer: PPO | Attending: Physician Assistant

## 2021-12-21 DIAGNOSIS — I5032 Chronic diastolic (congestive) heart failure: Secondary | ICD-10-CM

## 2021-12-21 DIAGNOSIS — I10 Essential (primary) hypertension: Secondary | ICD-10-CM

## 2021-12-21 DIAGNOSIS — E785 Hyperlipidemia, unspecified: Secondary | ICD-10-CM | POA: Diagnosis not present

## 2021-12-21 LAB — LIPID PANEL
Chol/HDL Ratio: 2.9 ratio (ref 0.0–4.4)
Cholesterol, Total: 153 mg/dL (ref 100–199)
HDL: 53 mg/dL (ref >39)
LDL Chol Calc (NIH): 85 mg/dL (ref 0–99)
Triglycerides: 80 mg/dL (ref 0–149)
VLDL Cholesterol Cal: 15 mg/dL (ref 5–40)

## 2021-12-21 LAB — COMPREHENSIVE METABOLIC PANEL
ALT: 17 IU/L (ref 0–32)
AST: 24 IU/L (ref 0–40)
Albumin/Globulin Ratio: 1.6 (ref 1.2–2.2)
Albumin: 3.8 g/dL (ref 3.6–4.6)
Alkaline Phosphatase: 85 IU/L (ref 44–121)
BUN/Creatinine Ratio: 10 — ABNORMAL LOW (ref 12–28)
BUN: 11 mg/dL (ref 10–36)
Bilirubin Total: 0.5 mg/dL (ref 0.0–1.2)
CO2: 31 mmol/L — ABNORMAL HIGH (ref 20–29)
Calcium: 9.4 mg/dL (ref 8.7–10.3)
Chloride: 101 mmol/L (ref 96–106)
Creatinine, Ser: 1.08 mg/dL — ABNORMAL HIGH (ref 0.57–1.00)
Globulin, Total: 2.4 g/dL (ref 1.5–4.5)
Glucose: 124 mg/dL — ABNORMAL HIGH (ref 70–99)
Potassium: 4.2 mmol/L (ref 3.5–5.2)
Sodium: 145 mmol/L — ABNORMAL HIGH (ref 134–144)
Total Protein: 6.2 g/dL (ref 6.0–8.5)
eGFR: 48 mL/min/{1.73_m2} — ABNORMAL LOW (ref >59)

## 2021-12-27 ENCOUNTER — Encounter: Payer: Self-pay | Admitting: Cardiovascular Disease

## 2021-12-27 NOTE — Progress Notes (Signed)
This encounter was created in error - please disregard.

## 2021-12-28 ENCOUNTER — Encounter: Payer: PPO | Admitting: Cardiovascular Disease

## 2022-01-02 DIAGNOSIS — K219 Gastro-esophageal reflux disease without esophagitis: Secondary | ICD-10-CM | POA: Diagnosis not present

## 2022-01-02 DIAGNOSIS — R490 Dysphonia: Secondary | ICD-10-CM | POA: Diagnosis not present

## 2022-01-10 ENCOUNTER — Encounter: Payer: Self-pay | Admitting: Gastroenterology

## 2022-01-18 ENCOUNTER — Ambulatory Visit: Payer: PPO | Admitting: Cardiovascular Disease

## 2022-02-13 ENCOUNTER — Encounter (INDEPENDENT_AMBULATORY_CARE_PROVIDER_SITE_OTHER): Payer: PPO | Admitting: Ophthalmology

## 2022-02-13 DIAGNOSIS — I10 Essential (primary) hypertension: Secondary | ICD-10-CM | POA: Diagnosis not present

## 2022-02-13 DIAGNOSIS — H35033 Hypertensive retinopathy, bilateral: Secondary | ICD-10-CM

## 2022-02-13 DIAGNOSIS — H353221 Exudative age-related macular degeneration, left eye, with active choroidal neovascularization: Secondary | ICD-10-CM

## 2022-02-13 DIAGNOSIS — H353114 Nonexudative age-related macular degeneration, right eye, advanced atrophic with subfoveal involvement: Secondary | ICD-10-CM | POA: Diagnosis not present

## 2022-02-13 DIAGNOSIS — H43813 Vitreous degeneration, bilateral: Secondary | ICD-10-CM

## 2022-02-22 ENCOUNTER — Ambulatory Visit: Payer: PPO | Admitting: Gastroenterology

## 2022-03-27 ENCOUNTER — Encounter: Payer: Self-pay | Admitting: Gastroenterology

## 2022-03-27 ENCOUNTER — Ambulatory Visit (INDEPENDENT_AMBULATORY_CARE_PROVIDER_SITE_OTHER): Payer: PPO | Admitting: Gastroenterology

## 2022-03-27 VITALS — BP 144/66 | HR 72 | Ht 66.0 in | Wt 203.0 lb

## 2022-03-27 DIAGNOSIS — K219 Gastro-esophageal reflux disease without esophagitis: Secondary | ICD-10-CM | POA: Diagnosis not present

## 2022-03-27 DIAGNOSIS — R142 Eructation: Secondary | ICD-10-CM | POA: Insufficient documentation

## 2022-03-27 MED ORDER — FAMOTIDINE 20 MG PO TABS: 20.0000 mg | ORAL_TABLET | Freq: Two times a day (BID) | ORAL | 1 refills | Status: AC

## 2022-03-27 MED ORDER — OMEPRAZOLE 20 MG PO CPDR: 20.0000 mg | DELAYED_RELEASE_CAPSULE | Freq: Every day | ORAL | 3 refills | Status: DC

## 2022-03-27 NOTE — Progress Notes (Signed)
03/27/2022 ALCIE RUNIONS [id number] 1929-03-23   HISTORY OF PRESENT ILLNESS:  This is a 88 year old female who is a patient of Dr. Corena Pilgrim.  She presents her today with her daughter in regards to issues with GERD and belching.  She denies dysphagia or reflux and says her belching has actually improved.  Looks like this referral was placed back in the summer.  She was also having hoarseness and had a CT of the neck and saw ENT.  She is on omeprazole 20 mg daily and Pepcid as well.  She really has no complaints today.     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 "   COPD (chronic obstructive pulmonary disease) (HCC)    Diabetes mellitus    GERD (gastroesophageal reflux disease)    History of cardiovascular stress test 06/01/2010   EF 71% - no evidence of ischemia, normal left ventricular systolic function   History of hysterectomy 1965   Hyperlipidemia    Hypertension    Hypothyroidism    Shortness of breath    on exertion   Tubular adenoma of colon    Past Surgical History:  Procedure Laterality Date   ABDOMINAL HYSTERECTOMY     CARDIAC SURGERY     catherization   HEMORRHOID SURGERY     TOTAL SHOULDER REPLACEMENT Right     reports that she has never smoked. She has never used smokeless tobacco. She reports that she does not drink alcohol and does not use drugs. family history includes Bone cancer in her sister; Colon cancer in her sister; Diabetes in her brother, brother, and mother; Heart disease in her father; Kidney failure in her mother; Leukemia in her brother and brother; Pancreatic cancer in her brother. No Known Allergies    Outpatient Encounter Medications as of 03/27/2022  Medication Sig   acetaminophen (TYLENOL) 500 MG tablet Take 2 tablets (1,000 mg total) by mouth every 6 (six) hours as needed.   albuterol (PROVENTIL) (2.5 MG/3ML) 0.083% nebulizer solution Take 3  mLs (2.5 mg total) by nebulization every 6 (six) hours as needed for wheezing or shortness of breath.   amLODipine (NORVASC) 10 MG tablet Take 1 tablet (10 mg total) by mouth daily.   atorvastatin (LIPITOR) 40 MG tablet TAKE 1 TABLET(40 MG) BY MOUTH DAILY AT 6 PM. Please make overdue appt with Dr. Acie Fredrickson before anymore refills. Thank you 2nd attempt   fluticasone (FLONASE) 50 MCG/ACT nasal spray SHAKE LIQUID AND USE 2 SPRAYS IN EACH NOSTRIL DAILY   gabapentin (NEURONTIN) 600 MG tablet Take 600 mg by mouth 3 (three) times daily.   levothyroxine (SYNTHROID, LEVOTHROID) 50 MCG tablet Take 50 mcg by mouth daily.   montelukast (SINGULAIR) 10 MG tablet Take 1 tablet by mouth daily in the afternoon.   Multiple Vitamins-Minerals (PRESERVISION AREDS 2+MULTI VIT PO) Take 1 tablet by mouth daily.   Polyvinyl Alcohol-Povidone 5-6 MG/ML SOLN Place 1 drop into both eyes daily as needed (Dry Eyes).   potassium chloride (MICRO-K) 10 MEQ CR capsule Take 10 mEq by mouth daily.    predniSONE (DELTASONE) 10 MG tablet Take 2 tablets (20 mg total) by mouth daily with breakfast.   traMADol (ULTRAM) 50 MG tablet Take 50 mg by mouth every 4 (four) hours as needed for pain.   zolpidem (AMBIEN) 10 MG tablet Take 5 mg  by mouth at bedtime as needed for sleep.    [DISCONTINUED] famotidine (PEPCID) 20 MG tablet Take 1 tablet by mouth daily in the afternoon.   [DISCONTINUED] famotidine (PEPCID) 20 MG tablet Take 1 tablet (20 mg total) by mouth 2 (two) times daily.   [DISCONTINUED] omeprazole (PRILOSEC) 20 MG capsule Take 1 capsule (20 mg total) by mouth daily.   famotidine (PEPCID) 20 MG tablet Take 1 tablet (20 mg total) by mouth 2 (two) times daily.   furosemide (LASIX) 20 MG tablet Take 1 tablet (20 mg total) by mouth daily.   omeprazole (PRILOSEC) 20 MG capsule Take 1 capsule (20 mg total) by mouth daily.   No facility-administered encounter medications on file as of 03/27/2022.     REVIEW OF SYSTEMS  : All other  systems reviewed and negative except where noted in the History of Present Illness.   PHYSICAL EXAM: BP (!) 144/66   Pulse 72   Ht '5\' 6"'$  (1.676 m)   Wt 203 lb (92.1 kg)   BMI 32.77 kg/m  General: Well developed white female in no acute distress Head: Normocephalic and atraumatic Eyes:  Sclerae anicteric, conjunctiva pink. Ears: Normal auditory acuity Lungs: Clear throughout to auscultation; no W/R/R. Heart: Regular rate and rhythm; no M/R/G. Abdomen: Soft, non-distended.  BS present.  Non-tender. Musculoskeletal: Symmetrical with no gross deformities  Skin: No lesions on visible extremities Extremities: No edema  Neurological: Alert oriented x 4, grossly non-focal Psychological:  Alert and cooperative. Normal mood and affect  ASSESSMENT AND PLAN: *88 year old female with GERD and belching: Denies dysphagia or reflux and says her belching has actually improved.  Looks like this referral was placed back in the summer.  She was also having hoarseness and had a CT of the neck and saw ENT.  She is on omeprazole 20 mg daily and Pepcid as well.  She really has no complaints today.  She will continue omeprazole 20 mg daily and do Pepcid 20 mg at bedtime and can use a dose midday if needed.  Will send prescriptions to her pharmacy.  Will follow-up here as needed for any other symptoms or in two years for further refills.   CC:  Sharilyn Sites, MD

## 2022-03-27 NOTE — Patient Instructions (Signed)
We have sent the following medications to your pharmacy for you to pick up at your convenience: Omeprazole 20 mg daily 30-60 minutes before breakfast.  Pepcid 20 mg twice daily (mid-day as needed and nightly before bed).   _______________________________________________________  If you are age 86 or older, your body mass index should be between 23-30. Your Body mass index is 32.77 kg/m. If this is out of the aforementioned range listed, please consider follow up with your Primary Care Provider.  If you are age 107 or younger, your body mass index should be between 19-25. Your Body mass index is 32.77 kg/m. If this is out of the aformentioned range listed, please consider follow up with your Primary Care Provider.   ________________________________________________________  The [facility] providers would like to encourage you to use [facility] to communicate with providers for non-urgent requests or questions.  Due to long hold times on the telephone, sending your provider a message by [facility] may be a faster and more efficient way to get a response.  Please allow 48 business hours for a response.  Please remember that this is for non-urgent requests.  _______________________________________________________

## 2022-03-28 NOTE — Progress Notes (Signed)
____________________________________________________________  Attending physician addendum:  Thank you for sending this case to me. I have reviewed the entire note and agree with the plan.   Creola Krotz Danis, MD  ____________________________________________________________  

## 2022-04-11 DIAGNOSIS — I5030 Unspecified diastolic (congestive) heart failure: Secondary | ICD-10-CM | POA: Diagnosis not present

## 2022-04-11 DIAGNOSIS — Z6833 Body mass index (BMI) 33.0-33.9, adult: Secondary | ICD-10-CM | POA: Diagnosis not present

## 2022-04-11 DIAGNOSIS — J45909 Unspecified asthma, uncomplicated: Secondary | ICD-10-CM | POA: Diagnosis not present

## 2022-04-11 DIAGNOSIS — I719 Aortic aneurysm of unspecified site, without rupture: Secondary | ICD-10-CM | POA: Diagnosis not present

## 2022-04-11 DIAGNOSIS — Z0001 Encounter for general adult medical examination with abnormal findings: Secondary | ICD-10-CM | POA: Diagnosis not present

## 2022-04-11 DIAGNOSIS — N183 Chronic kidney disease, stage 3 unspecified: Secondary | ICD-10-CM | POA: Diagnosis not present

## 2022-04-11 DIAGNOSIS — E119 Type 2 diabetes mellitus without complications: Secondary | ICD-10-CM | POA: Diagnosis not present

## 2022-04-11 DIAGNOSIS — E039 Hypothyroidism, unspecified: Secondary | ICD-10-CM | POA: Diagnosis not present

## 2022-04-11 DIAGNOSIS — M1991 Primary osteoarthritis, unspecified site: Secondary | ICD-10-CM | POA: Diagnosis not present

## 2022-04-11 DIAGNOSIS — E1140 Type 2 diabetes mellitus with diabetic neuropathy, unspecified: Secondary | ICD-10-CM | POA: Diagnosis not present

## 2022-04-11 DIAGNOSIS — Z1331 Encounter for screening for depression: Secondary | ICD-10-CM | POA: Diagnosis not present

## 2022-04-11 DIAGNOSIS — E6609 Other obesity due to excess calories: Secondary | ICD-10-CM | POA: Diagnosis not present

## 2022-04-17 DIAGNOSIS — Z23 Encounter for immunization: Secondary | ICD-10-CM | POA: Diagnosis not present

## 2022-06-06 DIAGNOSIS — N183 Chronic kidney disease, stage 3 unspecified: Secondary | ICD-10-CM | POA: Diagnosis not present

## 2022-06-06 DIAGNOSIS — I10 Essential (primary) hypertension: Secondary | ICD-10-CM | POA: Diagnosis not present

## 2022-08-12 ENCOUNTER — Other Ambulatory Visit: Payer: Self-pay | Admitting: Physician Assistant

## 2022-08-14 ENCOUNTER — Encounter (INDEPENDENT_AMBULATORY_CARE_PROVIDER_SITE_OTHER): Payer: PPO | Admitting: Ophthalmology

## 2022-08-14 DIAGNOSIS — H353114 Nonexudative age-related macular degeneration, right eye, advanced atrophic with subfoveal involvement: Secondary | ICD-10-CM | POA: Diagnosis not present

## 2022-08-14 DIAGNOSIS — H353221 Exudative age-related macular degeneration, left eye, with active choroidal neovascularization: Secondary | ICD-10-CM

## 2022-08-14 DIAGNOSIS — H35033 Hypertensive retinopathy, bilateral: Secondary | ICD-10-CM | POA: Diagnosis not present

## 2022-08-14 DIAGNOSIS — H43813 Vitreous degeneration, bilateral: Secondary | ICD-10-CM

## 2022-08-14 DIAGNOSIS — I10 Essential (primary) hypertension: Secondary | ICD-10-CM

## 2022-08-15 DIAGNOSIS — M25551 Pain in right hip: Secondary | ICD-10-CM | POA: Diagnosis not present

## 2022-08-15 DIAGNOSIS — M7122 Synovial cyst of popliteal space [Baker], left knee: Secondary | ICD-10-CM | POA: Diagnosis not present

## 2022-08-15 DIAGNOSIS — M25552 Pain in left hip: Secondary | ICD-10-CM | POA: Diagnosis not present

## 2022-08-15 DIAGNOSIS — M545 Low back pain, unspecified: Secondary | ICD-10-CM | POA: Diagnosis not present

## 2022-09-16 DIAGNOSIS — M5416 Radiculopathy, lumbar region: Secondary | ICD-10-CM | POA: Diagnosis not present

## 2022-09-16 DIAGNOSIS — M7062 Trochanteric bursitis, left hip: Secondary | ICD-10-CM | POA: Diagnosis not present

## 2022-09-24 DIAGNOSIS — L820 Inflamed seborrheic keratosis: Secondary | ICD-10-CM | POA: Diagnosis not present

## 2022-10-14 DIAGNOSIS — M545 Low back pain, unspecified: Secondary | ICD-10-CM | POA: Diagnosis not present

## 2022-10-28 DIAGNOSIS — M5416 Radiculopathy, lumbar region: Secondary | ICD-10-CM | POA: Diagnosis not present

## 2022-10-31 DIAGNOSIS — N183 Chronic kidney disease, stage 3 unspecified: Secondary | ICD-10-CM | POA: Diagnosis not present

## 2022-10-31 DIAGNOSIS — M5136 Other intervertebral disc degeneration, lumbar region: Secondary | ICD-10-CM | POA: Diagnosis not present

## 2022-10-31 DIAGNOSIS — E785 Hyperlipidemia, unspecified: Secondary | ICD-10-CM | POA: Diagnosis not present

## 2022-10-31 DIAGNOSIS — E6609 Other obesity due to excess calories: Secondary | ICD-10-CM | POA: Diagnosis not present

## 2022-10-31 DIAGNOSIS — Z6832 Body mass index (BMI) 32.0-32.9, adult: Secondary | ICD-10-CM | POA: Diagnosis not present

## 2022-10-31 DIAGNOSIS — R1310 Dysphagia, unspecified: Secondary | ICD-10-CM | POA: Diagnosis not present

## 2022-10-31 DIAGNOSIS — I5030 Unspecified diastolic (congestive) heart failure: Secondary | ICD-10-CM | POA: Diagnosis not present

## 2022-10-31 DIAGNOSIS — I10 Essential (primary) hypertension: Secondary | ICD-10-CM | POA: Diagnosis not present

## 2022-10-31 DIAGNOSIS — R7303 Prediabetes: Secondary | ICD-10-CM | POA: Diagnosis not present

## 2022-10-31 DIAGNOSIS — I719 Aortic aneurysm of unspecified site, without rupture: Secondary | ICD-10-CM | POA: Diagnosis not present

## 2022-10-31 DIAGNOSIS — E039 Hypothyroidism, unspecified: Secondary | ICD-10-CM | POA: Diagnosis not present

## 2022-11-06 DIAGNOSIS — M5416 Radiculopathy, lumbar region: Secondary | ICD-10-CM | POA: Diagnosis not present

## 2022-11-14 ENCOUNTER — Other Ambulatory Visit: Payer: Self-pay | Admitting: Cardiovascular Disease

## 2022-11-19 ENCOUNTER — Ambulatory Visit (HOSPITAL_COMMUNITY)
Admission: RE | Admit: 2022-11-19 | Discharge: 2022-11-19 | Disposition: A | Discharge status: Home / Self Care | Payer: PPO | Source: Ambulatory Visit | Attending: Cardiovascular Disease | Admitting: Cardiovascular Disease

## 2022-11-19 DIAGNOSIS — I771 Stricture of artery: Secondary | ICD-10-CM | POA: Insufficient documentation

## 2022-11-19 DIAGNOSIS — I6523 Occlusion and stenosis of bilateral carotid arteries: Secondary | ICD-10-CM | POA: Insufficient documentation

## 2022-11-19 DIAGNOSIS — I779 Disorder of arteries and arterioles, unspecified: Secondary | ICD-10-CM | POA: Insufficient documentation

## 2022-11-25 DIAGNOSIS — M48061 Spinal stenosis, lumbar region without neurogenic claudication: Secondary | ICD-10-CM | POA: Diagnosis not present

## 2022-11-25 DIAGNOSIS — M25561 Pain in right knee: Secondary | ICD-10-CM | POA: Diagnosis not present

## 2022-12-02 DIAGNOSIS — M25511 Pain in right shoulder: Secondary | ICD-10-CM | POA: Diagnosis not present

## 2022-12-04 ENCOUNTER — Encounter: Payer: Self-pay | Admitting: Cardiovascular Disease

## 2022-12-04 NOTE — Progress Notes (Unsigned)
Sandra Cuevas Date of Birth  1928/05/02       [facility]    [place] [address]  [address] [place], [place]  [address]   [place], [place]  [address] [phone]     [phone]   Fax  [phone]    Fax [phone]  Problem List: 1. Chest pain-normal stress Myoview study 2.  Chronic diastolic CHF - grade 2 DD  3. Hypertension 4. Diabetes Mellitus 5. Carotid bruit  IMPRESSION: Less than 50% stenosis in the right internal carotid artery with some plaque.  Stable 50-69% stenosis in the left internal carotid artery with significant calcified plaque.  Increasing velocity in the left external carotid artery compatible with significant narrowing.  6. RBBB  Previous notes:   Mrs. Elvir is an 89 year old female with a history of atypical chest pains. She had a negative stress Myoview study in March of 2012. She has a history of asthma and uses albuterol on an as-needed basis. She continues to have shortness of breath with exertion. He also has a history of hypertension.  She complains of chronic dyspnea.   She also has lots of back pain.  Dec. 16, 2014:  Ms. Shusterman is doing well from a cardiac standpoint. She's having lots of noncardiac problems. She's having problems with her bowels. She has lots of neck pain. She had an MRI performed several weeks ago which reveals significant cervical arthritis and disc disease. Her blood pressure has been normal.  She also has lots of asthma. She has used her inhalers on a regular basis.  Dec. 17, 2015:  Ms. Bonis is a 89 yo with hx of noncardiac CP,  HTN, carotid bruit, and DM.  She may have some sinus isues, hoarsness and scratchy throat.   Jan. 10, 2017:  Oveall doing well. Has occasional intrascapular pain 3 weeks ago.   Lasted about 2 weeks.  Was worse with movement . Has resolved how.   Jan. 23, 2018:  No cardiac issues.   Seen with daughter, Rene Kocher .  Has back and hip  issues.   No CP or dyspnea.   Does her normal activities. Does have occasional dyspnea and takes a Lasix to resolve this  Takes Lasix every 2-3 weeks at the most.   Has grade 2 diastolic dysfunction .   June 16, 2017:  Ms. Giarrusso is seen back today for follow-up of her chronic diastolic congestive heart failure, hypertension.  She is been seen in the emergency room on several occasions for chest pain.  She had the flu this past winter .    June 25, 2018:  Overall she is ding welll Seen for follow up of chronic diastoic CHF, HTN  No CP no dyspnea No cough or fever .   Aug 18, 2019: Mrs. Booton is a 89 year old female with a history of chronic diastolic congestive heart failure, hypertension, hyperlipidemia. Has DOE with walking Has been on home O2 since that time  Gets DOE walking as little as walking to the bathroom   She was hospitalized from May 4 May 6 with shortness of breath. Chest angiogram revealed no evidence of pulmonary embolus.  She did have small bilateral pleural effusions.  She was diuresed with IV Lasix and transitioned to p.o. Lasix. Hydralazine was stopped .   Was eating more salt prior to her hospitalization  .  Ate several pieces of salted fat back a day for several days which lead to  her hospitalization .   Jan. 4, 2022: Skyra is seen today for follow up of her chronic diastolic CHF, HTN, HLD She has moderate carotid artery disease  Has some right sided pain - thinks she slept on it wrong .  She had some recent labs with her primary MD which look good.  Potassium is 4.3  Creatinine is not listed.   Will call and get labs.   Aug. 29,2024 Konnor is seen for follow up of her chronic diastolic CJF, HTN, HLD, moderate carotid disease      Current Outpatient Medications on File Prior to Visit  Medication Sig Dispense Refill   acetaminophen (TYLENOL) 500 MG tablet Take 2 tablets (1,000 mg total) by mouth every 6 (six) hours as needed. 30 tablet 0   albuterol  (PROVENTIL) (2.5 MG/3ML) 0.083% nebulizer solution Take 3 mLs (2.5 mg total) by nebulization every 6 (six) hours as needed for wheezing or shortness of breath. 75 mL 0   amLODipine (NORVASC) 10 MG tablet Take 1 tablet (10 mg total) by mouth daily. 30 tablet 0   atorvastatin (LIPITOR) 40 MG tablet TAKE 1 TABLET(40 MG) BY MOUTH DAILY AT 6 PM 15 tablet 0   famotidine (PEPCID) 20 MG tablet Take 1 tablet (20 mg total) by mouth 2 (two) times daily. 180 tablet 1   fluticasone (FLONASE) 50 MCG/ACT nasal spray SHAKE LIQUID AND USE 2 SPRAYS IN EACH NOSTRIL DAILY 16 g 5   furosemide (LASIX) 20 MG tablet Take 1 tablet (20 mg total) by mouth daily. 30 tablet 0   gabapentin (NEURONTIN) 600 MG tablet Take 600 mg by mouth 3 (three) times daily.     levothyroxine (SYNTHROID, LEVOTHROID) 50 MCG tablet Take 50 mcg by mouth daily.     montelukast (SINGULAIR) 10 MG tablet Take 1 tablet by mouth daily in the afternoon.     Multiple Vitamins-Minerals (PRESERVISION AREDS 2+MULTI VIT PO) Take 1 tablet by mouth daily.     omeprazole (PRILOSEC) 20 MG capsule Take 1 capsule (20 mg total) by mouth daily. 90 capsule 3   Polyvinyl Alcohol-Povidone 5-6 MG/ML SOLN Place 1 drop into both eyes daily as needed (Dry Eyes).     potassium chloride (MICRO-K) 10 MEQ CR capsule Take 10 mEq by mouth daily.      predniSONE (DELTASONE) 10 MG tablet Take 2 tablets (20 mg total) by mouth daily with breakfast. 6 tablet 0   traMADol (ULTRAM) 50 MG tablet Take 50 mg by mouth every 4 (four) hours as needed for pain.     zolpidem (AMBIEN) 10 MG tablet Take 5 mg by mouth at bedtime as needed for sleep.      No current facility-administered medications on file prior to visit.    No Known Allergies  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 "   COPD (chronic obstructive pulmonary disease) (HCC)    Diabetes mellitus    GERD (gastroesophageal reflux  disease)    History of cardiovascular stress test 06/01/2010   EF 71% - no evidence of ischemia, normal left ventricular systolic function   History of hysterectomy 1965   Hyperlipidemia    Hypertension    Hypothyroidism    Shortness of breath    on exertion   Tubular adenoma of colon     Past Surgical History:  Procedure Laterality Date  ABDOMINAL HYSTERECTOMY     CARDIAC SURGERY     catherization   HEMORRHOID SURGERY     TOTAL SHOULDER REPLACEMENT Right     Social History   Tobacco Use  Smoking Status Never  Smokeless Tobacco Never    Social History   Substance and Sexual Activity  Alcohol Use No    Family History  Problem Relation Age of Onset   Heart disease Father        heart problems   Kidney failure Mother    Diabetes Mother    Leukemia Brother    Diabetes Brother    Colon cancer Sister    Leukemia Brother    Diabetes Brother    Pancreatic cancer Brother    Bone cancer Sister     Reviw of Systems:    Physical Exam: There were no vitals taken for this visit.  No BP recorded.  {Refresh Note OR Click here to enter BP  :1}***    GEN:  Well nourished, well developed in no acute distress HEENT: Normal NECK: No JVD; No carotid bruits LYMPHATICS: No lymphadenopathy CARDIAC: RRR ***, no murmurs, rubs, gallops RESPIRATORY:  Clear to auscultation without rales, wheezing or rhonchi  ABDOMEN: Soft, non-tender, non-distended MUSCULOSKELETAL:  No edema; No deformity  SKIN: Warm and dry NEUROLOGIC:  Alert and oriented x 3     ECG:        Assessment / Plan:        1.  . Acute on chronic diastolic CHF -   3. Hypertension -      4. Diabetes Mellitus -     5. Carotid bruit -     6.  Hyperlipidemia :      Kristeen Miss, MD  12/04/2022 6:53 PM    [facility] [address] [place],  [address] [place], [place]  [address] Pager [phone] Phone: [phone]; Fax: [phone]

## 2022-12-05 ENCOUNTER — Ambulatory Visit: Payer: PPO | Attending: Cardiovascular Disease | Admitting: Cardiovascular Disease

## 2022-12-05 ENCOUNTER — Encounter: Payer: Self-pay | Admitting: Cardiovascular Disease

## 2022-12-05 VITALS — BP 138/68 | HR 85 | Ht 66.0 in | Wt 197.6 lb

## 2022-12-05 DIAGNOSIS — I10 Essential (primary) hypertension: Secondary | ICD-10-CM

## 2022-12-05 DIAGNOSIS — Z79899 Other long term (current) drug therapy: Secondary | ICD-10-CM

## 2022-12-05 DIAGNOSIS — I5032 Chronic diastolic (congestive) heart failure: Secondary | ICD-10-CM | POA: Diagnosis not present

## 2022-12-05 DIAGNOSIS — E785 Hyperlipidemia, unspecified: Secondary | ICD-10-CM | POA: Diagnosis not present

## 2022-12-05 MED ORDER — ATORVASTATIN CALCIUM 40 MG PO TABS: ORAL_TABLET | ORAL | 3 refills | Status: DC

## 2022-12-05 MED ORDER — FUROSEMIDE 20 MG PO TABS: 20.0000 mg | ORAL_TABLET | Freq: Every day | ORAL | Status: DC | PRN

## 2022-12-05 NOTE — Patient Instructions (Addendum)
Medication InstructionS:  TAKE KDUR WITH LASIX  ONLY TAKE YOUR LASIX AS NEEDED   *If you need a refill on your cardiac medications before your next appointment, please call your pharmacy*   Lab Work: BMET, MAG, LIPIDS, ALT TODAY  If you have labs (blood work) drawn today and your tests are completely normal, you will receive your results only by: MyChart Message (if you have MyChart) OR A paper copy in the mail If you have any lab test that is abnormal or we need to change your treatment, we will call you to review the results.    Follow-Up: At [facility], you and your health needs are our priority.  As part of our continuing mission to provide you with exceptional heart care, we have created designated Provider Care Teams.  These Care Teams include your primary Cardiologist (physician) and Advanced Practice Providers (APPs -  Physician Assistants and Nurse Practitioners) who all work together to provide you with the care you need, when you need it.  We recommend signing up for the patient portal called "MyChart".  Sign up information is provided on this After Visit Summary.  MyChart is used to connect with patients for Virtual Visits (Telemedicine).  Patients are able to view lab/test results, encounter notes, upcoming appointments, etc.  Non-urgent messages can be sent to your provider as well.   To learn more about what you can do with MyChart, go to [email or web address].    Your next appointment:   1 year(s)  Provider:   Kristeen Miss, MD     Other Instructions

## 2022-12-06 LAB — MAGNESIUM: Magnesium: 1.7 mg/dL (ref 1.6–2.3)

## 2022-12-06 LAB — BASIC METABOLIC PANEL
BUN/Creatinine Ratio: 14 (ref 12–28)
BUN: 14 mg/dL (ref 10–36)
CO2: 30 mmol/L — ABNORMAL HIGH (ref 20–29)
Calcium: 9.3 mg/dL (ref 8.7–10.3)
Chloride: 101 mmol/L (ref 96–106)
Creatinine, Ser: 0.99 mg/dL (ref 0.57–1.00)
Glucose: 148 mg/dL — ABNORMAL HIGH (ref 70–99)
Potassium: 4.6 mmol/L (ref 3.5–5.2)
Sodium: 143 mmol/L (ref 134–144)
eGFR: 53 mL/min/{1.73_m2} — ABNORMAL LOW (ref >59)

## 2022-12-06 LAB — ALT: ALT: 23 IU/L (ref 0–32)

## 2022-12-06 LAB — LIPID PANEL
Chol/HDL Ratio: 2.5 ratio (ref 0.0–4.4)
Cholesterol, Total: 182 mg/dL (ref 100–199)
HDL: 73 mg/dL (ref >39)
LDL Chol Calc (NIH): 92 mg/dL (ref 0–99)
Triglycerides: 93 mg/dL (ref 0–149)
VLDL Cholesterol Cal: 17 mg/dL (ref 5–40)

## 2023-01-02 ENCOUNTER — Other Ambulatory Visit: Payer: Self-pay | Admitting: Orthopaedic Surgery

## 2023-01-02 DIAGNOSIS — M25511 Pain in right shoulder: Secondary | ICD-10-CM | POA: Diagnosis not present

## 2023-01-02 DIAGNOSIS — Z01818 Encounter for other preprocedural examination: Secondary | ICD-10-CM

## 2023-01-17 ENCOUNTER — Other Ambulatory Visit: Payer: PPO

## 2023-02-12 ENCOUNTER — Encounter (INDEPENDENT_AMBULATORY_CARE_PROVIDER_SITE_OTHER): Payer: PPO | Admitting: Ophthalmology

## 2023-02-18 ENCOUNTER — Other Ambulatory Visit: Payer: Self-pay | Admitting: Orthopaedic Surgery

## 2023-02-18 DIAGNOSIS — Z01818 Encounter for other preprocedural examination: Secondary | ICD-10-CM

## 2023-02-27 ENCOUNTER — Encounter (INDEPENDENT_AMBULATORY_CARE_PROVIDER_SITE_OTHER): Payer: PPO | Admitting: Ophthalmology

## 2023-02-27 DIAGNOSIS — H353114 Nonexudative age-related macular degeneration, right eye, advanced atrophic with subfoveal involvement: Secondary | ICD-10-CM

## 2023-02-27 DIAGNOSIS — H35033 Hypertensive retinopathy, bilateral: Secondary | ICD-10-CM | POA: Diagnosis not present

## 2023-02-27 DIAGNOSIS — I10 Essential (primary) hypertension: Secondary | ICD-10-CM

## 2023-02-27 DIAGNOSIS — H43813 Vitreous degeneration, bilateral: Secondary | ICD-10-CM

## 2023-02-27 DIAGNOSIS — H353221 Exudative age-related macular degeneration, left eye, with active choroidal neovascularization: Secondary | ICD-10-CM | POA: Diagnosis not present

## 2023-03-10 ENCOUNTER — Ambulatory Visit
Admission: RE | Admit: 2023-03-10 | Discharge: 2023-03-10 | Disposition: A | Discharge status: Home / Self Care | Payer: PPO | Source: Ambulatory Visit | Attending: Orthopaedic Surgery | Admitting: Orthopaedic Surgery

## 2023-03-10 DIAGNOSIS — Z01818 Encounter for other preprocedural examination: Secondary | ICD-10-CM

## 2023-03-10 DIAGNOSIS — M25511 Pain in right shoulder: Secondary | ICD-10-CM | POA: Diagnosis not present

## 2023-03-10 DIAGNOSIS — M67813 Other specified disorders of tendon, right shoulder: Secondary | ICD-10-CM | POA: Diagnosis not present

## 2023-04-14 ENCOUNTER — Emergency Department (HOSPITAL_COMMUNITY): Payer: PPO

## 2023-04-14 ENCOUNTER — Other Ambulatory Visit: Payer: Self-pay

## 2023-04-14 ENCOUNTER — Inpatient Hospital Stay (HOSPITAL_COMMUNITY)
Admission: EM | Admit: 2023-04-14 | Discharge: 2023-04-22 | DRG: 189 | Disposition: A | Discharge status: Home Health | Payer: PPO | Attending: Family Medicine | Admitting: Family Medicine

## 2023-04-14 ENCOUNTER — Encounter (HOSPITAL_COMMUNITY): Payer: Self-pay

## 2023-04-14 DIAGNOSIS — Z79899 Other long term (current) drug therapy: Secondary | ICD-10-CM

## 2023-04-14 DIAGNOSIS — R0602 Shortness of breath: Secondary | ICD-10-CM | POA: Diagnosis not present

## 2023-04-14 DIAGNOSIS — E039 Hypothyroidism, unspecified: Secondary | ICD-10-CM | POA: Diagnosis not present

## 2023-04-14 DIAGNOSIS — Z841 Family history of disorders of kidney and ureter: Secondary | ICD-10-CM | POA: Diagnosis not present

## 2023-04-14 DIAGNOSIS — R1310 Dysphagia, unspecified: Secondary | ICD-10-CM | POA: Diagnosis not present

## 2023-04-14 DIAGNOSIS — K219 Gastro-esophageal reflux disease without esophagitis: Secondary | ICD-10-CM | POA: Diagnosis present

## 2023-04-14 DIAGNOSIS — Z8249 Family history of ischemic heart disease and other diseases of the circulatory system: Secondary | ICD-10-CM | POA: Diagnosis not present

## 2023-04-14 DIAGNOSIS — J90 Pleural effusion, not elsewhere classified: Secondary | ICD-10-CM | POA: Diagnosis not present

## 2023-04-14 DIAGNOSIS — R008 Other abnormalities of heart beat: Secondary | ICD-10-CM | POA: Diagnosis not present

## 2023-04-14 DIAGNOSIS — Z6831 Body mass index (BMI) 31.0-31.9, adult: Secondary | ICD-10-CM

## 2023-04-14 DIAGNOSIS — J129 Viral pneumonia, unspecified: Secondary | ICD-10-CM | POA: Diagnosis not present

## 2023-04-14 DIAGNOSIS — Z66 Do not resuscitate: Secondary | ICD-10-CM | POA: Diagnosis not present

## 2023-04-14 DIAGNOSIS — Z833 Family history of diabetes mellitus: Secondary | ICD-10-CM | POA: Diagnosis not present

## 2023-04-14 DIAGNOSIS — Z800 Family history of malignant neoplasm of digestive organs: Secondary | ICD-10-CM

## 2023-04-14 DIAGNOSIS — J189 Pneumonia, unspecified organism: Secondary | ICD-10-CM | POA: Diagnosis not present

## 2023-04-14 DIAGNOSIS — I5032 Chronic diastolic (congestive) heart failure: Secondary | ICD-10-CM | POA: Diagnosis not present

## 2023-04-14 DIAGNOSIS — Z79890 Hormone replacement therapy (postmenopausal): Secondary | ICD-10-CM

## 2023-04-14 DIAGNOSIS — J9611 Chronic respiratory failure with hypoxia: Secondary | ICD-10-CM | POA: Diagnosis present

## 2023-04-14 DIAGNOSIS — E1140 Type 2 diabetes mellitus with diabetic neuropathy, unspecified: Secondary | ICD-10-CM | POA: Diagnosis present

## 2023-04-14 DIAGNOSIS — E785 Hyperlipidemia, unspecified: Secondary | ICD-10-CM | POA: Diagnosis not present

## 2023-04-14 DIAGNOSIS — I110 Hypertensive heart disease with heart failure: Secondary | ICD-10-CM | POA: Diagnosis not present

## 2023-04-14 DIAGNOSIS — J984 Other disorders of lung: Secondary | ICD-10-CM | POA: Diagnosis not present

## 2023-04-14 DIAGNOSIS — J9601 Acute respiratory failure with hypoxia: Secondary | ICD-10-CM | POA: Diagnosis not present

## 2023-04-14 DIAGNOSIS — E874 Mixed disorder of acid-base balance: Secondary | ICD-10-CM | POA: Diagnosis not present

## 2023-04-14 DIAGNOSIS — M1990 Unspecified osteoarthritis, unspecified site: Secondary | ICD-10-CM | POA: Diagnosis present

## 2023-04-14 DIAGNOSIS — J9612 Chronic respiratory failure with hypercapnia: Secondary | ICD-10-CM | POA: Diagnosis not present

## 2023-04-14 DIAGNOSIS — J440 Chronic obstructive pulmonary disease with acute lower respiratory infection: Secondary | ICD-10-CM | POA: Diagnosis not present

## 2023-04-14 DIAGNOSIS — I700 Atherosclerosis of aorta: Secondary | ICD-10-CM | POA: Diagnosis not present

## 2023-04-14 DIAGNOSIS — J811 Chronic pulmonary edema: Secondary | ICD-10-CM | POA: Diagnosis not present

## 2023-04-14 DIAGNOSIS — Z8541 Personal history of malignant neoplasm of cervix uteri: Secondary | ICD-10-CM

## 2023-04-14 DIAGNOSIS — Z1152 Encounter for screening for COVID-19: Secondary | ICD-10-CM | POA: Diagnosis not present

## 2023-04-14 DIAGNOSIS — G4700 Insomnia, unspecified: Secondary | ICD-10-CM | POA: Diagnosis not present

## 2023-04-14 DIAGNOSIS — Z806 Family history of leukemia: Secondary | ICD-10-CM

## 2023-04-14 DIAGNOSIS — I2489 Other forms of acute ischemic heart disease: Secondary | ICD-10-CM | POA: Diagnosis not present

## 2023-04-14 DIAGNOSIS — R918 Other nonspecific abnormal finding of lung field: Secondary | ICD-10-CM | POA: Diagnosis not present

## 2023-04-14 DIAGNOSIS — Z90710 Acquired absence of both cervix and uterus: Secondary | ICD-10-CM

## 2023-04-14 DIAGNOSIS — Z96611 Presence of right artificial shoulder joint: Secondary | ICD-10-CM | POA: Diagnosis present

## 2023-04-14 DIAGNOSIS — R058 Other specified cough: Secondary | ICD-10-CM | POA: Diagnosis not present

## 2023-04-14 DIAGNOSIS — E66811 Obesity, class 1: Secondary | ICD-10-CM | POA: Diagnosis present

## 2023-04-14 DIAGNOSIS — I5A Non-ischemic myocardial injury (non-traumatic): Secondary | ICD-10-CM | POA: Diagnosis present

## 2023-04-14 DIAGNOSIS — R54 Age-related physical debility: Secondary | ICD-10-CM | POA: Diagnosis present

## 2023-04-14 DIAGNOSIS — R591 Generalized enlarged lymph nodes: Secondary | ICD-10-CM | POA: Diagnosis not present

## 2023-04-14 DIAGNOSIS — I517 Cardiomegaly: Secondary | ICD-10-CM | POA: Diagnosis not present

## 2023-04-14 DIAGNOSIS — Z860101 Personal history of adenomatous and serrated colon polyps: Secondary | ICD-10-CM

## 2023-04-14 DIAGNOSIS — J449 Chronic obstructive pulmonary disease, unspecified: Secondary | ICD-10-CM | POA: Diagnosis not present

## 2023-04-14 LAB — COMPREHENSIVE METABOLIC PANEL
ALT: 21 U/L (ref 0–44)
AST: 23 U/L (ref 15–41)
Albumin: 2.9 g/dL — ABNORMAL LOW (ref 3.5–5.0)
Alkaline Phosphatase: 67 U/L (ref 38–126)
Anion gap: 7 (ref 5–15)
BUN: 18 mg/dL (ref 8–23)
CO2: 30 mmol/L (ref 22–32)
Calcium: 8.4 mg/dL — ABNORMAL LOW (ref 8.9–10.3)
Chloride: 103 mmol/L (ref 98–111)
Creatinine, Ser: 0.73 mg/dL (ref 0.44–1.00)
GFR, Estimated: >60 mL/min (ref >60)
Glucose, Bld: 141 mg/dL — ABNORMAL HIGH (ref 70–99)
Potassium: 3.7 mmol/L (ref 3.5–5.1)
Sodium: 140 mmol/L (ref 135–145)
Total Bilirubin: 0.2 mg/dL (ref 0.0–1.2)
Total Protein: 5.7 g/dL — ABNORMAL LOW (ref 6.5–8.1)

## 2023-04-14 LAB — CBC WITH DIFFERENTIAL/PLATELET
Abs Immature Granulocytes: 0.02 10*3/uL (ref 0.00–0.07)
Basophils Absolute: 0 10*3/uL (ref 0.0–0.1)
Basophils Relative: 0 %
Eosinophils Absolute: 0.1 10*3/uL (ref 0.0–0.5)
Eosinophils Relative: 1 %
HCT: 33.4 % — ABNORMAL LOW (ref 36.0–46.0)
Hemoglobin: 10.2 g/dL — ABNORMAL LOW (ref 12.0–15.0)
Immature Granulocytes: 0 %
Lymphocytes Relative: 16 %
Lymphs Abs: 1.3 10*3/uL (ref 0.7–4.0)
MCH: 32.1 pg (ref 26.0–34.0)
MCHC: 30.5 g/dL (ref 30.0–36.0)
MCV: 105 fL — ABNORMAL HIGH (ref 80.0–100.0)
Monocytes Absolute: 1.3 10*3/uL — ABNORMAL HIGH (ref 0.1–1.0)
Monocytes Relative: 15 %
Neutro Abs: 5.6 10*3/uL (ref 1.7–7.7)
Neutrophils Relative %: 68 %
Platelets: 146 10*3/uL — ABNORMAL LOW (ref 150–400)
RBC: 3.18 MIL/uL — ABNORMAL LOW (ref 3.87–5.11)
RDW: 14.4 % (ref 11.5–15.5)
WBC: 8.3 10*3/uL (ref 4.0–10.5)
nRBC: 0 % (ref 0.0–0.2)

## 2023-04-14 LAB — TROPONIN I (HIGH SENSITIVITY)
Troponin I (High Sensitivity): 16 ng/L (ref <18)
Troponin I (High Sensitivity): 20 ng/L — ABNORMAL HIGH (ref <18)

## 2023-04-14 LAB — D-DIMER, QUANTITATIVE: D-Dimer, Quant: 1.92 ug{FEU}/mL — ABNORMAL HIGH (ref 0.00–0.50)

## 2023-04-14 LAB — BRAIN NATRIURETIC PEPTIDE: B Natriuretic Peptide: 98 pg/mL (ref 0.0–100.0)

## 2023-04-14 MED ORDER — MELATONIN 3 MG PO TABS: 6.0000 mg | ORAL_TABLET | Freq: Every evening | ORAL | Status: DC | PRN

## 2023-04-14 MED ORDER — LEVOTHYROXINE SODIUM 50 MCG PO TABS
50.0000 ug | ORAL_TABLET | Freq: Every day | ORAL | Status: DC
  Administered 2023-04-15 – 2023-04-22 (×8): 50 ug via ORAL
  Filled 2023-04-14 (×8): qty 1

## 2023-04-14 MED ORDER — ONDANSETRON HCL 4 MG/2ML IJ SOLN: 4.0000 mg | Freq: Four times a day (QID) | INTRAMUSCULAR | Status: DC | PRN

## 2023-04-14 MED ORDER — ALBUTEROL SULFATE (2.5 MG/3ML) 0.083% IN NEBU
2.5000 mg | INHALATION_SOLUTION | RESPIRATORY_TRACT | Status: DC | PRN
  Administered 2023-04-18 – 2023-04-20 (×2): 2.5 mg via RESPIRATORY_TRACT
  Filled 2023-04-14 (×2): qty 3

## 2023-04-14 MED ORDER — FAMOTIDINE 20 MG PO TABS
20.0000 mg | ORAL_TABLET | Freq: Every day | ORAL | Status: DC
  Administered 2023-04-15 – 2023-04-22 (×8): 20 mg via ORAL
  Filled 2023-04-14 (×8): qty 1

## 2023-04-14 MED ORDER — AMLODIPINE BESYLATE 5 MG PO TABS
10.0000 mg | ORAL_TABLET | Freq: Every day | ORAL | Status: DC
Start: 2023-04-15 — End: 2023-04-22
  Administered 2023-04-15 – 2023-04-22 (×8): 10 mg via ORAL
  Filled 2023-04-14 (×8): qty 2

## 2023-04-14 MED ORDER — SODIUM CHLORIDE 0.9 % IV SOLN
2.0000 g | Freq: Once | INTRAVENOUS | Status: AC
  Administered 2023-04-14: 2 g via INTRAVENOUS
  Filled 2023-04-14: qty 20

## 2023-04-14 MED ORDER — FLUTICASONE PROPIONATE 50 MCG/ACT NA SUSP
2.0000 | Freq: Every day | NASAL | Status: DC
  Administered 2023-04-15 – 2023-04-22 (×8): 2 via NASAL
  Filled 2023-04-14 (×3): qty 16

## 2023-04-14 MED ORDER — IOHEXOL 350 MG/ML SOLN
100.0000 mL | Freq: Once | INTRAVENOUS | Status: AC | PRN
  Administered 2023-04-14: 100 mL via INTRAVENOUS

## 2023-04-14 MED ORDER — SODIUM CHLORIDE 0.9 % IV SOLN
500.0000 mg | Freq: Once | INTRAVENOUS | Status: AC
  Administered 2023-04-15: 500 mg via INTRAVENOUS
  Filled 2023-04-14: qty 5

## 2023-04-14 MED ORDER — ENOXAPARIN SODIUM 40 MG/0.4ML IJ SOSY
40.0000 mg | PREFILLED_SYRINGE | INTRAMUSCULAR | Status: DC
  Administered 2023-04-15 – 2023-04-21 (×8): 40 mg via SUBCUTANEOUS
  Filled 2023-04-14 (×8): qty 0.4

## 2023-04-14 MED ORDER — PANTOPRAZOLE SODIUM 40 MG PO TBEC
40.0000 mg | DELAYED_RELEASE_TABLET | Freq: Every day | ORAL | Status: DC
  Administered 2023-04-15 – 2023-04-21 (×8): 40 mg via ORAL
  Filled 2023-04-14 (×8): qty 1

## 2023-04-14 MED ORDER — POLYETHYLENE GLYCOL 3350 17 G PO PACK
17.0000 g | PACK | Freq: Every day | ORAL | Status: DC | PRN
  Administered 2023-04-20: 17 g via ORAL
  Filled 2023-04-14: qty 1

## 2023-04-14 MED ORDER — ATORVASTATIN CALCIUM 40 MG PO TABS
40.0000 mg | ORAL_TABLET | Freq: Every day | ORAL | Status: DC
  Administered 2023-04-15 – 2023-04-21 (×7): 40 mg via ORAL
  Filled 2023-04-14 (×7): qty 1

## 2023-04-14 MED ORDER — ACETAMINOPHEN 500 MG PO TABS
1000.0000 mg | ORAL_TABLET | Freq: Four times a day (QID) | ORAL | Status: DC | PRN
  Filled 2023-04-14: qty 2

## 2023-04-14 MED ORDER — GABAPENTIN 300 MG PO CAPS
600.0000 mg | ORAL_CAPSULE | Freq: Three times a day (TID) | ORAL | Status: DC
  Administered 2023-04-15 – 2023-04-22 (×23): 600 mg via ORAL
  Filled 2023-04-14 (×23): qty 2

## 2023-04-14 MED ORDER — CEFTRIAXONE SODIUM 1 G IJ SOLR
1.0000 g | INTRAMUSCULAR | Status: AC
  Administered 2023-04-15 – 2023-04-18 (×4): 1 g via INTRAVENOUS
  Filled 2023-04-14 (×4): qty 10

## 2023-04-14 MED ORDER — AZITHROMYCIN 250 MG PO TABS
500.0000 mg | ORAL_TABLET | Freq: Every day | ORAL | Status: AC
  Administered 2023-04-15 – 2023-04-16 (×2): 500 mg via ORAL
  Filled 2023-04-14 (×2): qty 2

## 2023-04-14 MED ORDER — SODIUM CHLORIDE 0.9 % IV SOLN: 500.0000 mg | INTRAVENOUS | Status: DC

## 2023-04-14 MED ORDER — FUROSEMIDE 10 MG/ML IJ SOLN
40.0000 mg | Freq: Once | INTRAMUSCULAR | Status: AC
  Administered 2023-04-14: 40 mg via INTRAVENOUS
  Filled 2023-04-14: qty 4

## 2023-04-14 NOTE — H&P (Addendum)
 History and Physical    Sandra Cuevas FMW:[id number] DOB: 1928-06-25 DOA: 04/14/2023  PCP: Marvine Rush, MD   Patient coming from: Home   Chief Complaint:  SOB    HPI:  Sandra ABRUZZESE is a 89 y.o. female with hx of COPD/asthma, HFpEF, hypertension, hyperlipidemia, diabetes, hypothyroidism, remote GYN cancer status post hysterectomy who presented due to worsening shortness of breath and hypoxia at home.  Reports she has been more fatigued over the past few weeks.  Over the past few days worsening dyspnea on exertion.  She had new onset of cough productive with gray chunks of sputum.  And she checked her home O2 which was in the 70s at the lowest prompting her to come into the hospital.  She does not wear oxygen  at home.  No known sick contacts   Review of Systems:  ROS complete and negative except as marked above   No Known Allergies  Prior to Admission medications   Medication Sig Start Date End Date Taking? Authorizing Provider  acetaminophen  (TYLENOL ) 500 MG tablet Take 2 tablets (1,000 mg total) by mouth every 6 (six) hours as needed. 07/05/16  Yes Armenta Canning, MD  albuterol  (PROVENTIL ) (2.5 MG/3ML) 0.083% nebulizer solution Take 3 mLs (2.5 mg total) by nebulization every 6 (six) hours as needed for wheezing or shortness of breath. 05/16/21  Yes Hagler, Redell, MD  amLODipine  (NORVASC ) 10 MG tablet Take 1 tablet (10 mg total) by mouth daily. 03/24/19  Yes Emokpae, Courage, MD  atorvastatin  (LIPITOR) 40 MG tablet TAKE 1 TABLET(40 MG) BY MOUTH DAILY AT 6 PM 12/05/22  Yes Nahser, Aleene PARAS, MD  famotidine  (PEPCID ) 20 MG tablet Take 1 tablet (20 mg total) by mouth 2 (two) times daily. Patient taking differently: Take 20 mg by mouth daily. 03/27/22  Yes Zehr, Jessica D, PA-C  fluticasone  (FLONASE ) 50 MCG/ACT nasal spray SHAKE LIQUID AND USE 2 SPRAYS IN EACH NOSTRIL DAILY 12/07/18  Yes Byrum, Lamar RAMAN, MD  gabapentin  (NEURONTIN ) 600 MG tablet Take 600 mg by mouth 3 (three) times daily. 08/05/19   Yes [provider]  levothyroxine  (SYNTHROID , LEVOTHROID) 50 MCG tablet Take 50 mcg by mouth daily.   Yes [provider]  Multiple Vitamins-Minerals (PRESERVISION AREDS 2+MULTI VIT PO) Take 1 tablet by mouth daily.   Yes [provider]  omeprazole  (PRILOSEC) 20 MG capsule Take 1 capsule (20 mg total) by mouth daily. Patient taking differently: Take 20 mg by mouth at bedtime. 03/27/22  Yes Zehr, Jessica D, PA-C  potassium chloride  (MICRO-K ) 10 MEQ CR capsule Take 10 mEq by mouth daily. Take one capsule by mouth only when you take your lasix . 08/15/10  Yes [provider]  zolpidem  (AMBIEN ) 10 MG tablet Take 5 mg by mouth at bedtime. 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    COPD (chronic obstructive pulmonary disease) (HCC)    Diabetes mellitus    GERD (gastroesophageal reflux disease)    History of cardiovascular stress test 06/01/2010   EF 71% - no evidence of ischemia, normal left ventricular systolic function   History of hysterectomy 1965   Hyperlipidemia    Hypertension    Hypothyroidism    Shortness of breath    on exertion   Tubular adenoma of colon  Past Surgical History:  Procedure Laterality Date   ABDOMINAL HYSTERECTOMY     CARDIAC SURGERY     catherization   HEMORRHOID SURGERY     TOTAL SHOULDER REPLACEMENT Right      reports that she has never smoked. She has never used smokeless tobacco. She reports that she does not drink alcohol  and does not use drugs.  Family History  Problem Relation Age of Onset   Heart disease Father        heart problems   Kidney failure Mother    Diabetes Mother    Leukemia Brother    Diabetes Brother    Colon cancer Sister    Leukemia Brother    Diabetes Brother    Pancreatic cancer Brother    Bone cancer Sister      Physical Exam: Vitals:    04/15/23 0100 04/15/23 0145 04/15/23 0230 04/15/23 0400  BP: (!) 164/70 (!) 144/58 (!) 145/56 (!) 157/60  Pulse: 83 79 72 75  Resp: 18 19 19 17   Temp:      TempSrc:      SpO2: 91% 93% 95% 99%  Weight:      Height:        Gen: Awake, alert, elderly, frail CV: Regular, normal S1, S2, no murmurs  Resp: Normal WOB, on nasal cannula, scattered rhonchi Abd: Flat, normoactive, nontender MSK: Symmetric, trace 1+ pitting edema Skin: No rashes or lesions to exposed skin  Neuro: Alert and interactive  Psych: euthymic, appropriate    Data review:   Labs reviewed, notable for:   Chemistries unremarkable, BNP within normal limits High-sensitivity troponin 16 -> 20 Microcytic anemia hemoglobin 10 D-dimer +1.92  Micro:  Results for orders placed or performed during the hospital encounter of 04/14/23  SARS Coronavirus 2 by RT PCR (hospital order, performed in [facility] hospital lab) *cepheid single result test* Anterior Nasal Swab     Status: None   Collection Time: 04/14/23 10:30 PM   Specimen: Anterior Nasal Swab  Result Value Ref Range Status   SARS Coronavirus 2 by RT PCR NEGATIVE NEGATIVE Final    Comment: (NOTE) SARS-CoV-2 target nucleic acids are NOT DETECTED.  The SARS-CoV-2 RNA is generally detectable in upper and lower respiratory specimens during the acute phase of infection. The lowest concentration of SARS-CoV-2 viral copies this assay can detect is 250 copies / mL. A negative result does not preclude SARS-CoV-2 infection and should not be used as the sole basis for treatment or other patient management decisions.  A negative result may occur with improper specimen collection / handling, submission of specimen other than nasopharyngeal swab, presence of viral mutation(s) within the areas targeted by this assay, and inadequate number of viral copies (<250 copies / mL). A negative result must be combined with clinical observations, patient history, and epidemiological  information.  Fact Sheet for Patients:   roadlaptop.co.za  Fact Sheet for Healthcare Providers: http://kim-miller.com/  This test is not yet approved or  cleared by the [place]  FDA and has been authorized for detection and/or diagnosis of SARS-CoV-2 by FDA under an Emergency Use Authorization (EUA).  This EUA will remain in effect (meaning this test can be used) for the duration of the COVID-19 declaration under Section 564(b)(1) of the Act, 21 U.S.C. section 360bbb-3(b)(1), unless the authorization is terminated or revoked sooner.  Performed at [facility], [address] [place], [place] [address]     Imaging reviewed:  CT Angio Chest PE W and/or Wo Contrast Result  Date: 04/14/2023 CLINICAL DATA:  Positive D-dimer.  Shortness of breath. EXAM: CT ANGIOGRAPHY CHEST WITH CONTRAST TECHNIQUE: Multidetector CT imaging of the chest was performed using the standard protocol during bolus administration of intravenous contrast. Multiplanar CT image reconstructions and MIPs were obtained to evaluate the vascular anatomy. RADIATION DOSE REDUCTION: This exam was performed according to the departmental dose-optimization program which includes automated exposure control, adjustment of the mA and/or kV according to patient size and/or use of iterative reconstruction technique. CONTRAST:  OMNIPAQUE  IOHEXOL  350 MG/ML SOLN COMPARISON:  CT angiogram chest 08/10/2019 FINDINGS: Cardiovascular: Heart is enlarged. There is no pericardial effusion. Aorta is normal in size. There are atherosclerotic calcifications of the aorta. There is adequate opacification of the pulmonary arteries to the segmental level. No pulmonary embolism identified. Mediastinum/Nodes: There are enlarged precarinal lymph nodes measuring up to 13 mm. There is an enlarged subcarinal lymph node measuring 11 mm. There are enlarged right hilar lymph nodes measuring up to 12 mm. These are all  similar to prior study. Visualized thyroid  gland and esophagus are within normal limits. Lungs/Pleura: There are small bilateral pleural effusions similar to the prior examination. There is a new focal area of airspace disease with surrounding ground-glass opacity in the superior segment of the right lower lobe and minimally in the adjacent right upper lobe. There also new patchy airspace and ground-glass opacities minimally in the central left upper lobe. Atelectatic changes are seen in the bilateral lower lobes, right middle lobe and lingula. There is no pneumothorax. Trachea and central airways are patent. There some new nodular densities in the left lower lobe measuring up to 5 mm on image 6/90. Upper Abdomen: No acute abnormality. Musculoskeletal: There is a rounded 17 mm nodular area in the inferior right breast which was not seen on the prior examination image 4/67. Right shoulder arthroplasty is present. No acute fractures are seen. Review of the MIP images confirms the above findings. IMPRESSION: 1. No evidence for pulmonary embolism. 2. New focal area of airspace disease with ground-glass opacities in the bilateral upper lobes. Findings are worrisome for multifocal pneumonia. 3. Stable small bilateral pleural effusions. 4. Stable mediastinal and right hilar lymphadenopathy. 5. New nodular densities in the left lower lobe measuring up to 5 mm. Non-contrast chest CT at 3-6 months is recommended. If the nodules are stable at time of repeat CT, then future CT at 18-24 months (from today's scan) is considered optional for low-risk patients, but is recommended for high-risk patients. 6. 17 mm nodular area in the inferior right breast was not seen on the prior examination. Correlate with mammography. 7. Aortic atherosclerosis. Aortic Atherosclerosis (ICD10-I70.0). Electronically Signed   By: Greig Pique M.D.   On: 04/14/2023 21:25   DG Chest Port 1 View Result Date: 04/14/2023 CLINICAL DATA:  Shortness of  breath. EXAM: PORTABLE CHEST 1 VIEW COMPARISON:  05/16/2021 and CT chest 08/10/2019. FINDINGS: Patient is slightly rotated. Heart is enlarged and accentuated by semi upright AP technique. Thoracic aorta is calcified. Volume with possible mild basilar interstitial prominence. Slight blunting of the costophrenic angles. Right shoulder arthroplasty. IMPRESSION: Question mild congestive heart failure. Electronically Signed   By: Newell Eke M.D.   On: 04/14/2023 18:29    EKG:  Sinus rhythm, right bundle branch block, no acute ischemic changes  ED Course:  Treated with Lasix  40 mg IV, ceftriaxone , azithromycin    Assessment/Plan:  89 y.o. female with hx COPD/asthma, HFpEF, hypertension, hyperlipidemia, diabetes, hypothyroidism, remote GYN cancer status post hysterectomy who  presented due to worsening shortness of breath and hypoxia at home.  Found to have acute hypoxic respiratory failure, multifocal pneumonia.  Multifocal pneumonia, suspect viral Acute hypoxic respiratory failure, requiring 2 L O2 - Continue ceftriaxone , azithromycin  - Check RVP, sputum culture - Albuterol  every 4 hours as needed, incentive spirometry, flutter valve.  Ordered for ambulation every shift, advised patient to try and stay out of bed to chair during the day. - Sat goal greater than 92%, wean as tolerated.  Walk test prior to discharge.  Acute myocardial injury Very minimal troponin elevation and flat 16 -> 20.  EKG without acute ischemic changes.  Likely related to hypoxia. - Treatment directed at pneumonia, hypoxia per above  Incidental findings:  Lung nodule left lower lobe up to the 5 mm: Recommended for repeat CT in 3 to 6 months (if within goals of care) Mediastinal and hilar adenopathy which is stable.  Chronic medical problems: COPD/asthma: Albuterol  as needed, continue home Singulair  HFpEF, without acute exacerbation: Status post dose of Lasix  40 mg IV in the ED.  Continue home Lasix  20 mg  p.o. Hypertension: Continue home amlodipine  Hyperlipidemia: Continue home atorvastatin  Diabetes: SSI while inpatient Neuropathy: Continue her home gabapentin  Hypothyroidism: Continue her home levothyroxine  History of remote gyn cancer with hysterectomy: Outpatient surveillance.  Body mass index is 31.88 kg/m. Obesity class I affecting medical care    DVT prophylaxis:  Lovenox  Code Status:  DNR/DNI(Do NOT Intubate); Durable DNR on file  Diet:  Diet Orders (From admission, onward)     Start     Ordered   04/14/23 2215  Diet regular Room service appropriate? Yes; Fluid consistency: Thin  Diet effective now       Question Answer Comment  Room service appropriate? Yes   Fluid consistency: Thin      04/14/23 2217           Family Communication:  Yes discussed with daughter at bedside   Consults:  None   Admission status:   Inpatient, Telemetry bed  Severity of Illness: The appropriate patient status for this patient is INPATIENT. Inpatient status is judged to be reasonable and necessary in order to provide the required intensity of service to ensure the patient's safety. The patient's presenting symptoms, physical exam findings, and initial radiographic and laboratory data in the context of their chronic comorbidities is felt to place them at high risk for further clinical deterioration. Furthermore, it is not anticipated that the patient will be medically stable for discharge from the hospital within 2 midnights of admission.   * I certify that at the point of admission it is my clinical judgment that the patient will require inpatient hospital care spanning beyond 2 midnights from the point of admission due to high intensity of service, high risk for further deterioration and high frequency of surveillance required.*   Dorn Dawson, MD [facility]  How to contact the [facility] Attending or Consulting provider 7A - 7P or covering provider during after hours 7P -7A, for this  patient.  Check the care team in [facility] and look for a) attending/consulting [facility] provider listed and b) the [facility] team listed Log into [email or web address] and use [facility] universal password to access. If you do not have the password, please contact the hospital operator. Locate the [facility] provider you are looking for under [facility] and page to a number that you can be directly reached. If you still have difficulty reaching the provider, please page the [facility] (Director on Call) for  the Hospitalists listed on amion for assistance.  04/15/2023, 4:25 AM

## 2023-04-14 NOTE — ED Provider Notes (Signed)
 [place] EMERGENCY DEPARTMENT AT [facility] Provider Note  CSN: [id number] Arrival date & time: 04/14/23 1642  Chief Complaint(s) No chief complaint on file.  HPI Sandra Cuevas is a 89 y.o. female history of COPD, hyperlipidemia, hypertension, heart failure with preserved ejection fraction presenting to the emergency department presenting with shortness of breath.  Patient reports shortness of breath and dyspnea on exertion.  She reports some minimal chest pain but does not seem to be pleuritic or exertional.  No cough.  No orthopnea.  No fevers or chills.  No lightheadedness or dizziness.  No syncope.   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    COPD (chronic obstructive pulmonary disease) (HCC)    Diabetes mellitus    GERD (gastroesophageal reflux disease)    History of cardiovascular stress test 06/01/2010   EF 71% - no evidence of ischemia, normal left ventricular systolic function   History of hysterectomy 1965   Hyperlipidemia    Hypertension    Hypothyroidism    Shortness of breath    on exertion   Tubular adenoma of colon    Patient Active Problem List   Diagnosis Date Noted   Acute hypoxic respiratory failure (HCC) 04/14/2023   Belching 03/27/2022   Upper airway cough syndrome 10/03/2021   Carotid artery disease (HCC) 07/03/2021   Aortic stenosis 07/03/2021   Mixed stress and urge urinary incontinence 07/13/2020   Rectocele 07/13/2020   Constipation 07/13/2020   S/P hysterectomy with oophorectomy 07/13/2020   Pelvic pressure in female 07/13/2020   Bilateral sciatica 07/13/2020   Encounter for screening fecal occult blood testing 07/13/2020   Dyspnea 08/10/2019   CKD (chronic kidney disease) stage 3, GFR 30-59 ml/min (HCC) 08/10/2019   Right rib fracture 03/21/2019   Acute respiratory failure (HCC) 03/20/2019   Fever 10/08/2018    Chronic rhinitis 11/17/2017   Pulmonary nodule, right 03/10/2017   GERD (gastroesophageal reflux disease) 10/14/2016   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   Chest pain 07/14/2016   Diabetes mellitus, type II (HCC) 07/14/2016   CKD (chronic kidney disease), stage II 07/14/2016   Cellulitis 07/12/2016   Cellulitis of right leg 07/12/2016   (HFpEF) heart failure with preserved ejection fraction (HCC) 04/30/2016   Hoarse voice quality 09/05/2015   Chronic obstructive airway disease with asthma (HCC) 04/20/2013   Snoring 04/20/2013   Premature ventricular contractions 03/23/2013   Change in stool 01/06/2012   Shortness of breath 09/26/2011   Carpal tunnel syndrome 11/06/2010   Hyperlipidemia LDL goal <70    Atypical chest pain    Hypothyroidism    Diabetes mellitus (HCC)    EXTERNAL HEMORRHOIDS WITHOUT MENTION COMP 02/12/2010   RECTAL BLEEDING 02/12/2010   CERVICAL CANCER 10/18/2009   Dyslipidemia 10/18/2009   Essential hypertension 10/18/2009   Cough 10/18/2009   Home Medication(s) Prior to Admission medications   Medication Sig Start Date End Date Taking? Authorizing Provider  acetaminophen  (TYLENOL ) 500 MG tablet Take 2 tablets (1,000 mg total) by mouth every 6 (six) hours as needed. 07/05/16  Yes Armenta Canning, MD  albuterol  (PROVENTIL ) (2.5 MG/3ML) 0.083% nebulizer solution Take 3 mLs (2.5 mg total) by nebulization every 6 (six) hours as needed for wheezing or shortness of breath. 05/16/21  Yes Hagler, Redell, MD  amLODipine  (NORVASC ) 10 MG tablet Take 1 tablet (10  mg total) by mouth daily. 03/24/19  Yes Emokpae, Courage, MD  atorvastatin  (LIPITOR) 40 MG tablet TAKE 1 TABLET(40 MG) BY MOUTH DAILY AT 6 PM 12/05/22  Yes Nahser, Aleene PARAS, MD  famotidine  (PEPCID ) 20 MG tablet Take 1 tablet (20 mg total) by mouth 2 (two) times daily. Patient taking differently: Take 20 mg by mouth daily. 03/27/22  Yes Zehr, Jessica D, PA-C  fluticasone  (FLONASE ) 50 MCG/ACT nasal spray SHAKE  LIQUID AND USE 2 SPRAYS IN EACH NOSTRIL DAILY 12/07/18  Yes Byrum, Lamar RAMAN, MD  gabapentin  (NEURONTIN ) 600 MG tablet Take 600 mg by mouth 3 (three) times daily. 08/05/19  Yes [provider]  levothyroxine  (SYNTHROID , LEVOTHROID) 50 MCG tablet Take 50 mcg by mouth daily.   Yes [provider]  Multiple Vitamins-Minerals (PRESERVISION AREDS 2+MULTI VIT PO) Take 1 tablet by mouth daily.   Yes [provider]  omeprazole  (PRILOSEC) 20 MG capsule Take 1 capsule (20 mg total) by mouth daily. Patient taking differently: Take 20 mg by mouth at bedtime. 03/27/22  Yes Zehr, Jessica D, PA-C  potassium chloride  (MICRO-K ) 10 MEQ CR capsule Take 10 mEq by mouth daily. Take one capsule by mouth only when you take your lasix . 08/15/10  Yes [provider]  zolpidem  (AMBIEN ) 10 MG tablet Take 5 mg by mouth at bedtime. 08/05/19  Yes [provider]                                                                                                                                    Past Surgical History Past Surgical History:  Procedure Laterality Date   ABDOMINAL HYSTERECTOMY     CARDIAC SURGERY     catherization   HEMORRHOID SURGERY     TOTAL SHOULDER REPLACEMENT Right    Family History Family History  Problem Relation Age of Onset   Heart disease Father        heart problems   Kidney failure Mother    Diabetes Mother    Leukemia Brother    Diabetes Brother    Colon cancer Sister    Leukemia Brother    Diabetes Brother    Pancreatic cancer Brother    Bone cancer Sister     Social History Social History   Tobacco Use   Smoking status: Never   Smokeless tobacco: Never  Vaping Use   Vaping status: Never Used  Substance Use Topics   Alcohol  use: No   Drug use: Never   Allergies Patient has no known allergies.  Review of Systems Review of Systems  All other systems reviewed and are negative.   Physical Exam Vital Signs  I have reviewed the  triage vital signs BP (!) 178/62   Pulse 77   Temp 98.3 F (36.8 C) (Oral)   Resp 17   Ht 5' 6 (1.676 m)   Wt 89.6 kg   SpO2 94%   BMI 31.88  kg/m  Physical Exam Vitals and nursing note reviewed.  Constitutional:      General: She is not in acute distress.    Appearance: She is well-developed.  HENT:     Head: Normocephalic and atraumatic.     Mouth/Throat:     Mouth: Mucous membranes are moist.  Eyes:     Pupils: Pupils are equal, round, and reactive to light.  Neck:     Vascular: Hepatojugular reflux and JVD present.  Cardiovascular:     Rate and Rhythm: Normal rate and regular rhythm.     Heart sounds: No murmur heard. Pulmonary:     Effort: Pulmonary effort is normal. No respiratory distress.     Breath sounds: Examination of the right-lower field reveals rales. Examination of the left-lower field reveals rales. Rales present.  Abdominal:     General: Abdomen is flat.     Palpations: Abdomen is soft.     Tenderness: There is no abdominal tenderness.  Musculoskeletal:        General: No tenderness.     Right lower leg: Edema present.     Left lower leg: Edema present.  Skin:    General: Skin is warm and dry.  Neurological:     General: No focal deficit present.     Mental Status: She is alert. Mental status is at baseline.  Psychiatric:        Mood and Affect: Mood normal.        Behavior: Behavior normal.     ED Results and Treatments Labs (all labs ordered are listed, but only abnormal results are displayed) Labs Reviewed  COMPREHENSIVE METABOLIC PANEL - Abnormal; Notable for the following components:      Result Value   Glucose, Bld 141 (*)    Calcium  8.4 (*)    Total Protein 5.7 (*)    Albumin 2.9 (*)    All other components within normal limits  CBC WITH DIFFERENTIAL/PLATELET - Abnormal; Notable for the following components:   RBC 3.18 (*)    Hemoglobin 10.2 (*)    HCT 33.4 (*)    MCV 105.0 (*)    Platelets 146 (*)    Monocytes Absolute 1.3  (*)    All other components within normal limits  D-DIMER, QUANTITATIVE - Abnormal; Notable for the following components:   D-Dimer, Quant 1.92 (*)    All other components within normal limits  TROPONIN I (HIGH SENSITIVITY) - Abnormal; Notable for the following components:   Troponin I (High Sensitivity) 20 (*)    All other components within normal limits  EXPECTORATED SPUTUM ASSESSMENT W GRAM STAIN, RFLX TO RESP C  RESPIRATORY PANEL BY PCR  SARS CORONAVIRUS 2 BY RT PCR  BRAIN NATRIURETIC PEPTIDE  BASIC METABOLIC PANEL  CBC  MAGNESIUM   PHOSPHORUS  TROPONIN I (HIGH SENSITIVITY)  Radiology CT Angio Chest PE W and/or Wo Contrast Result Date: 04/14/2023 CLINICAL DATA:  Positive D-dimer.  Shortness of breath. EXAM: CT ANGIOGRAPHY CHEST WITH CONTRAST TECHNIQUE: Multidetector CT imaging of the chest was performed using the standard protocol during bolus administration of intravenous contrast. Multiplanar CT image reconstructions and MIPs were obtained to evaluate the vascular anatomy. RADIATION DOSE REDUCTION: This exam was performed according to the departmental dose-optimization program which includes automated exposure control, adjustment of the mA and/or kV according to patient size and/or use of iterative reconstruction technique. CONTRAST:  OMNIPAQUE  IOHEXOL  350 MG/ML SOLN COMPARISON:  CT angiogram chest 08/10/2019 FINDINGS: Cardiovascular: Heart is enlarged. There is no pericardial effusion. Aorta is normal in size. There are atherosclerotic calcifications of the aorta. There is adequate opacification of the pulmonary arteries to the segmental level. No pulmonary embolism identified. Mediastinum/Nodes: There are enlarged precarinal lymph nodes measuring up to 13 mm. There is an enlarged subcarinal lymph node measuring 11 mm. There are enlarged right hilar lymph nodes  measuring up to 12 mm. These are all similar to prior study. Visualized thyroid  gland and esophagus are within normal limits. Lungs/Pleura: There are small bilateral pleural effusions similar to the prior examination. There is a new focal area of airspace disease with surrounding ground-glass opacity in the superior segment of the right lower lobe and minimally in the adjacent right upper lobe. There also new patchy airspace and ground-glass opacities minimally in the central left upper lobe. Atelectatic changes are seen in the bilateral lower lobes, right middle lobe and lingula. There is no pneumothorax. Trachea and central airways are patent. There some new nodular densities in the left lower lobe measuring up to 5 mm on image 6/90. Upper Abdomen: No acute abnormality. Musculoskeletal: There is a rounded 17 mm nodular area in the inferior right breast which was not seen on the prior examination image 4/67. Right shoulder arthroplasty is present. No acute fractures are seen. Review of the MIP images confirms the above findings. IMPRESSION: 1. No evidence for pulmonary embolism. 2. New focal area of airspace disease with ground-glass opacities in the bilateral upper lobes. Findings are worrisome for multifocal pneumonia. 3. Stable small bilateral pleural effusions. 4. Stable mediastinal and right hilar lymphadenopathy. 5. New nodular densities in the left lower lobe measuring up to 5 mm. Non-contrast chest CT at 3-6 months is recommended. If the nodules are stable at time of repeat CT, then future CT at 18-24 months (from today's scan) is considered optional for low-risk patients, but is recommended for high-risk patients. 6. 17 mm nodular area in the inferior right breast was not seen on the prior examination. Correlate with mammography. 7. Aortic atherosclerosis. Aortic Atherosclerosis (ICD10-I70.0). Electronically Signed   By: Greig Pique M.D.   On: 04/14/2023 21:25   DG Chest Port 1 View Result Date:  04/14/2023 CLINICAL DATA:  Shortness of breath. EXAM: PORTABLE CHEST 1 VIEW COMPARISON:  05/16/2021 and CT chest 08/10/2019. FINDINGS: Patient is slightly rotated. Heart is enlarged and accentuated by semi upright AP technique. Thoracic aorta is calcified. Volume with possible mild basilar interstitial prominence. Slight blunting of the costophrenic angles. Right shoulder arthroplasty. IMPRESSION: Question mild congestive heart failure. Electronically Signed   By: Newell Eke M.D.   On: 04/14/2023 18:29    Pertinent labs & imaging results that were available during my care of the patient were reviewed by me and considered in my medical decision making (see MDM for details).  Medications Ordered in ED Medications  cefTRIAXone  (ROCEPHIN ) 2 g in sodium chloride  0.9 % 100 mL IVPB (2 g Intravenous New Bag/Given 04/14/23 2230)  enoxaparin  (LOVENOX ) injection 40 mg (has no administration in time range)  albuterol  (PROVENTIL ) (2.5 MG/3ML) 0.083% nebulizer solution 2.5 mg (has no administration in time range)  cefTRIAXone  (ROCEPHIN ) 1 g in sodium chloride  0.9 % 100 mL IVPB (has no administration in time range)  azithromycin  (ZITHROMAX ) tablet 500 mg (has no administration in time range)  azithromycin  (ZITHROMAX ) 500 mg in sodium chloride  0.9 % 250 mL IVPB (has no administration in time range)  acetaminophen  (TYLENOL ) tablet 1,000 mg (has no administration in time range)  melatonin tablet 6 mg (has no administration in time range)  ondansetron  (ZOFRAN ) injection 4 mg (has no administration in time range)  polyethylene glycol (MIRALAX  / GLYCOLAX ) packet 17 g (has no administration in time range)  amLODipine  (NORVASC ) tablet 10 mg (has no administration in time range)  atorvastatin  (LIPITOR) tablet 40 mg (has no administration in time range)  famotidine  (PEPCID ) tablet 20 mg (has no administration in time range)  fluticasone  (FLONASE ) 50 MCG/ACT nasal spray 2 spray (has no administration in time range)   gabapentin  (NEURONTIN ) capsule 600 mg (has no administration in time range)  levothyroxine  (SYNTHROID ) tablet 50 mcg (has no administration in time range)  pantoprazole  (PROTONIX ) EC tablet 40 mg (has no administration in time range)  furosemide  (LASIX ) injection 40 mg (40 mg Intravenous Given 04/14/23 1935)  iohexol  (OMNIPAQUE ) 350 MG/ML injection 100 mL (100 mLs Intravenous Contrast Given 04/14/23 2108)                                                                                                                                     Procedures .Critical Care  Performed by: Francesca Elsie CROME, MD Authorized by: Francesca Elsie CROME, MD   Critical care provider statement:    Critical care time (minutes):  30   Critical care was necessary to treat or prevent imminent or life-threatening deterioration of the following conditions:  Respiratory failure   Critical care was time spent personally by me on the following activities:  Development of treatment plan with patient or surrogate, discussions with consultants, evaluation of patient's response to treatment, examination of patient, ordering and review of laboratory studies, ordering and review of radiographic studies, ordering and performing treatments and interventions, pulse oximetry, re-evaluation of patient's condition and review of old charts   Care discussed with: admitting provider     (including critical care time)  Medical Decision Making / ED Course   MDM:  89 year old female presenting to the emergency department with shortness of breath.  Symptoms seem most consistent with CHF exacerbation given the lower extremity edema, JVD.  She did have some mild chest pain, D-dimer is positive so will obtain CTA chest as well.  Also obtain troponin but lower concern for ACS.  Chest x-ray shows findings concerning for mild CHF exacerbation.  No pneumothorax or pneumonia.  No fevers or chills, cough to suggest occult pneumonia.  Given hypoxia  patient will need admission.  Clinical Course as of 04/14/23 2258  Mon Apr 14, 2023  [address] [facility] Chest [place] 1 View [WS]  2256 D-dimer was positive so patient received CT scan.  CT scan shows no PE, but possible multifocal pneumonia.  Her BNP is actually normal, so lower concern for CHF.  Will cover with antibiotics and admit patient.  Discussed with hospitalist Dr. Keturah who is admitted the patient. [WS]    Clinical Course User Index [WS] Francesca Elsie CROME, MD     Additional history obtained: -Additional history obtained from family -External records from outside source obtained and reviewed including: Chart review including previous notes, labs, imaging, consultation notes including prior cardiology notes    Lab Tests: -I ordered, reviewed, and interpreted labs.   The pertinent results include:   Labs Reviewed  COMPREHENSIVE METABOLIC PANEL - Abnormal; Notable for the following components:      Result Value   Glucose, Bld 141 (*)    Calcium  8.4 (*)    Total Protein 5.7 (*)    Albumin 2.9 (*)    All other components within normal limits  CBC WITH DIFFERENTIAL/PLATELET - Abnormal; Notable for the following components:   RBC 3.18 (*)    Hemoglobin 10.2 (*)    HCT 33.4 (*)    MCV 105.0 (*)    Platelets 146 (*)    Monocytes Absolute 1.3 (*)    All other components within normal limits  D-DIMER, QUANTITATIVE - Abnormal; Notable for the following components:   D-Dimer, Quant 1.92 (*)    All other components within normal limits  TROPONIN I (HIGH SENSITIVITY) - Abnormal; Notable for the following components:   Troponin I (High Sensitivity) 20 (*)    All other components within normal limits  EXPECTORATED SPUTUM ASSESSMENT W GRAM STAIN, RFLX TO RESP C  RESPIRATORY PANEL BY PCR  SARS CORONAVIRUS 2 BY RT PCR  BRAIN NATRIURETIC PEPTIDE  BASIC METABOLIC PANEL  CBC  MAGNESIUM   PHOSPHORUS  TROPONIN I (HIGH SENSITIVITY)    Notable for minimal troponin elevation, stable   EKG    EKG Interpretation Date/Time:  Monday April 14 2023 19:35:41 EST Ventricular Rate:  78 PR Interval:  184 QRS Duration:  159 QT Interval:  428 QTC Calculation: 488 R Axis:   145  Text Interpretation: Sinus rhythm Right bundle branch block Anteroseptal infarct, age indeterminate Confirmed by Francesca Elsie ([id number]) on 04/14/2023 7:37:15 PM         Imaging Studies ordered: I ordered imaging studies including CT chest On my interpretation imaging demonstrates pneumonia  I independently visualized and interpreted imaging. I agree with the radiologist interpretation   Medicines ordered and prescription drug management: Meds ordered this encounter  Medications   furosemide  (LASIX ) injection 40 mg   iohexol  (OMNIPAQUE ) 350 MG/ML injection 100 mL   cefTRIAXone  (ROCEPHIN ) 2 g in sodium chloride  0.9 % 100 mL IVPB    Antibiotic Indication::   CAP   DISCONTD: azithromycin  (ZITHROMAX ) 500 mg in sodium chloride  0.9 % 250 mL IVPB    Antibiotic Indication::   CAP   enoxaparin  (LOVENOX ) injection 40 mg   albuterol  (PROVENTIL ) (2.5 MG/3ML) 0.083% nebulizer solution 2.5 mg   cefTRIAXone  (ROCEPHIN ) 1 g in sodium chloride  0.9 % 100 mL IVPB    Antibiotic Indication::   CAP   azithromycin  (ZITHROMAX ) tablet 500 mg   azithromycin  (ZITHROMAX ) 500 mg  in sodium chloride  0.9 % 250 mL IVPB    Antibiotic Indication::   CAP   acetaminophen  (TYLENOL ) tablet 1,000 mg   melatonin tablet 6 mg   ondansetron  (ZOFRAN ) injection 4 mg   polyethylene glycol (MIRALAX  / GLYCOLAX ) packet 17 g   amLODipine  (NORVASC ) tablet 10 mg   atorvastatin  (LIPITOR) tablet 40 mg    TAKE 1 TABLET(40 MG) BY MOUTH DAILY AT 6 PM     famotidine  (PEPCID ) tablet 20 mg   fluticasone  (FLONASE ) 50 MCG/ACT nasal spray 2 spray   gabapentin  (NEURONTIN ) capsule 600 mg   levothyroxine  (SYNTHROID ) tablet 50 mcg   pantoprazole  (PROTONIX ) EC tablet 40 mg    -I have reviewed the patients home medicines and have made adjustments as  needed   Consultations Obtained: I requested consultation with the hospitalist,  and discussed lab and imaging findings as well as pertinent plan - they recommend: admission   Cardiac Monitoring: The patient was maintained on a cardiac monitor.  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    Final Clinical Impression(s) / ED Diagnoses Final diagnoses:  Acute hypoxic respiratory failure (HCC)  Multifocal pneumonia     This chart was dictated using voice recognition software.  Despite best efforts to proofread,  errors can occur which can change the documentation meaning.    Francesca Elsie CROME, MD 04/14/23 2258

## 2023-04-14 NOTE — ED Triage Notes (Signed)
 Pt c/o shortness of breath with exertion and "feels like heart is pounding." Pt oxygen in triage 83% RA.

## 2023-04-14 NOTE — ED Notes (Signed)
 SpO2 on 3 L [place] now 95%

## 2023-04-15 DIAGNOSIS — J9601 Acute respiratory failure with hypoxia: Secondary | ICD-10-CM | POA: Diagnosis not present

## 2023-04-15 LAB — RESPIRATORY PANEL BY PCR

## 2023-04-15 LAB — BASIC METABOLIC PANEL
Anion gap: 8 (ref 5–15)
BUN: 14 mg/dL (ref 8–23)
CO2: 38 mmol/L — ABNORMAL HIGH (ref 22–32)
Calcium: 9.4 mg/dL (ref 8.9–10.3)
Chloride: 92 mmol/L — ABNORMAL LOW (ref 98–111)
Creatinine, Ser: 0.79 mg/dL (ref 0.44–1.00)
GFR, Estimated: >60 mL/min (ref >60)
Glucose, Bld: 132 mg/dL — ABNORMAL HIGH (ref 70–99)
Potassium: 3.9 mmol/L (ref 3.5–5.1)
Sodium: 138 mmol/L (ref 135–145)

## 2023-04-15 LAB — CBC
HCT: 33.7 % — ABNORMAL LOW (ref 36.0–46.0)
Hemoglobin: 10.2 g/dL — ABNORMAL LOW (ref 12.0–15.0)
MCH: 31.5 pg (ref 26.0–34.0)
MCHC: 30.3 g/dL (ref 30.0–36.0)
MCV: 104 fL — ABNORMAL HIGH (ref 80.0–100.0)
Platelets: 153 10*3/uL (ref 150–400)
RBC: 3.24 MIL/uL — ABNORMAL LOW (ref 3.87–5.11)
RDW: 14.1 % (ref 11.5–15.5)
WBC: 9.1 10*3/uL (ref 4.0–10.5)
nRBC: 0 % (ref 0.0–0.2)

## 2023-04-15 LAB — GLUCOSE, CAPILLARY
Glucose-Capillary: 160 mg/dL — ABNORMAL HIGH (ref 70–99)
Glucose-Capillary: 122 mg/dL — ABNORMAL HIGH (ref 70–99)

## 2023-04-15 LAB — CBG MONITORING, ED
Glucose-Capillary: 133 mg/dL — ABNORMAL HIGH (ref 70–99)
Glucose-Capillary: 113 mg/dL — ABNORMAL HIGH (ref 70–99)

## 2023-04-15 LAB — PHOSPHORUS: Phosphorus: 4.4 mg/dL (ref 2.5–4.6)

## 2023-04-15 LAB — SARS CORONAVIRUS 2 BY RT PCR: SARS Coronavirus 2 by RT PCR: NEGATIVE

## 2023-04-15 LAB — MAGNESIUM: Magnesium: 1.5 mg/dL — ABNORMAL LOW (ref 1.7–2.4)

## 2023-04-15 MED ORDER — MAGNESIUM SULFATE 2 GM/50ML IV SOLN
2.0000 g | Freq: Once | INTRAVENOUS | Status: AC
  Administered 2023-04-15: 2 g via INTRAVENOUS
  Filled 2023-04-15: qty 50

## 2023-04-15 MED ORDER — INSULIN ASPART 100 UNIT/ML IJ SOLN
0.0000 [IU] | Freq: Three times a day (TID) | INTRAMUSCULAR | Status: DC
  Administered 2023-04-15: 1 [IU] via SUBCUTANEOUS
  Administered 2023-04-19: 3 [IU] via SUBCUTANEOUS
  Administered 2023-04-20: 2 [IU] via SUBCUTANEOUS
  Administered 2023-04-21: 1 [IU] via SUBCUTANEOUS

## 2023-04-15 NOTE — Progress Notes (Signed)
   04/15/23 1153  TOC Brief Assessment  Insurance and Status Reviewed  Patient has primary care physician Yes  Home environment has been reviewed from home  Prior level of function: independent  Prior/Current Home Services No current home services  Social Drivers of Health Review SDOH reviewed no interventions necessary  Readmission risk has been reviewed Yes  Transition of care needs no transition of care needs at this time    Transition of Care Department [facility]) has reviewed patient and no TOC needs have been identified at this time. We will continue to monitor patient advancement through interdisciplinary progression rounds. If new patient transition needs arise, please place a TOC consult.

## 2023-04-15 NOTE — Progress Notes (Signed)
 PROGRESS NOTE    Sandra Cuevas  FMW:[id number] DOB: Dec 22, 1928 DOA: 04/14/2023 PCP: Marvine Rush, MD   Brief Narrative:    Sandra Cuevas is a 89 y.o. female with hx of COPD/asthma, HFpEF, hypertension, hyperlipidemia, diabetes, hypothyroidism, remote GYN cancer status post hysterectomy who presented due to worsening shortness of breath and hypoxia at home.  Patient was admitted with acute hypoxemic respiratory failure secondary to multifocal pneumonia.  Assessment & Plan:   Principal Problem:   Acute hypoxic respiratory failure (HCC)  Assessment and Plan:   Multifocal pneumonia, suspect viral Acute hypoxic respiratory failure, requiring 2 L O2 - Continue ceftriaxone , azithromycin  - Check RVP, sputum culture - Albuterol  every 4 hours as needed, incentive spirometry, flutter valve.  Ordered for ambulation every shift, advised patient to try and stay out of bed to chair during the day. - Sat goal greater than 92%, wean as tolerated.  Walk test prior to discharge.   Troponin elevation Likely demand ischemia with flat trend - Treatment directed at pneumonia, hypoxia per above   Incidental findings:  Lung nodule left lower lobe up to the 5 mm: Recommended for repeat CT in 3 to 6 months (if within goals of care) Mediastinal and hilar adenopathy which is stable.   Chronic medical problems: COPD/asthma: Albuterol  as needed, continue home Singulair  HFpEF, without acute exacerbation: Status post dose of Lasix  40 mg IV in the ED.  Continue home Lasix  20 mg p.o. Hypertension: Continue home amlodipine  Hyperlipidemia: Continue home atorvastatin  Diabetes: SSI while inpatient Neuropathy: Continue her home gabapentin  Hypothyroidism: Continue her home levothyroxine  History of remote gyn cancer with hysterectomy: Outpatient surveillance.   Body mass index is 31.88 kg/m. Obesity class I affecting medical care      DVT prophylaxis:Lovenox  Code Status: DNR Family Communication: Daughter at  bedside 1/7 Disposition Plan:  Status is: Inpatient Remains inpatient appropriate because: Need for IV antibiotics.   Consultants:  None  Procedures:  None  Antimicrobials:  Anti-infectives (From admission, onward)    Start     Dose/Rate Route Frequency Ordered Stop   04/15/23 2300  cefTRIAXone  (ROCEPHIN ) 1 g in sodium chloride  0.9 % 100 mL IVPB        1 g 200 mL/hr over 30 Minutes Intravenous Every 24 hours 04/14/23 2217 04/19/23 2259   04/15/23 2200  azithromycin  (ZITHROMAX ) tablet 500 mg        500 mg Oral Daily at bedtime 04/14/23 2217 04/17/23 2159   04/14/23 2230  azithromycin  (ZITHROMAX ) 500 mg in sodium chloride  0.9 % 250 mL IVPB        500 mg 250 mL/hr over 60 Minutes Intravenous  Once 04/14/23 2217 04/15/23 0130   04/14/23 2145  cefTRIAXone  (ROCEPHIN ) 2 g in sodium chloride  0.9 % 100 mL IVPB        2 g 200 mL/hr over 30 Minutes Intravenous  Once 04/14/23 2135 04/14/23 2349   04/14/23 2145  azithromycin  (ZITHROMAX ) 500 mg in sodium chloride  0.9 % 250 mL IVPB  Status:  Discontinued        500 mg 250 mL/hr over 60 Minutes Intravenous Every 24 hours 04/14/23 2135 04/14/23 2217       Subjective: Patient seen and evaluated today with no new acute complaints or concerns. No acute concerns or events noted overnight.  Objective: Vitals:   04/15/23 0800 04/15/23 0930 04/15/23 1015 04/15/23 1033  BP: (!) 153/65 (!) 147/59 (!) 151/58   Pulse: 81 81 70   Resp: 20 19 19  Temp:    98.8 F (37.1 C)  TempSrc:    Oral  SpO2: 95% 95% 94%   Weight:      Height:        Intake/Output Summary (Last 24 hours) at 04/15/2023 1055 Last data filed at 04/15/2023 0000 Gross per 24 hour  Intake 480 ml  Output 1900 ml  Net -1420 ml   Filed Weights   04/14/23 1658  Weight: 89.6 kg    Examination:  General exam: Appears calm and comfortable  Respiratory system: Clear to auscultation. Respiratory effort normal.  Currently on 2 L nasal cannula oxygen  Cardiovascular system: S1 &  S2 heard, RRR.  Gastrointestinal system: Abdomen is soft Central nervous system: Alert and awake Extremities: No edema Skin: No significant lesions noted Psychiatry: Flat affect.    Data Reviewed: I have personally reviewed following labs and imaging studies  CBC: Recent Labs  Lab 04/14/23 1833 04/15/23 0414  WBC 8.3 9.1  NEUTROABS 5.6  --   HGB 10.2* 10.2*  HCT 33.4* 33.7*  MCV 105.0* 104.0*  PLT 146* 153   Basic Metabolic Panel: Recent Labs  Lab 04/14/23 1833 04/15/23 0414  NA 140 138  K 3.7 3.9  CL 103 92*  CO2 30 38*  GLUCOSE 141* 132*  BUN 18 14  CREATININE 0.73 0.79  CALCIUM  8.4* 9.4  MG  --  1.5*  PHOS  --  4.4   GFR: Estimated Creatinine Clearance: 50.6 mL/min (by C-G formula based on SCr of 0.79 mg/dL). Liver Function Tests: Recent Labs  Lab 04/14/23 1833  AST 23  ALT 21  ALKPHOS 67  BILITOT 0.2  PROT 5.7*  ALBUMIN 2.9*   No results for input(s): LIPASE, AMYLASE in the last 168 hours. No results for input(s): AMMONIA in the last 168 hours. Coagulation Profile: No results for input(s): INR, PROTIME in the last 168 hours. Cardiac Enzymes: No results for input(s): CKTOTAL, CKMB, CKMBINDEX, TROPONINI in the last 168 hours. BNP (last 3 results) No results for input(s): PROBNP in the last 8760 hours. HbA1C: No results for input(s): HGBA1C in the last 72 hours. CBG: Recent Labs  Lab 04/15/23 0752  GLUCAP 133*   Lipid Profile: No results for input(s): CHOL, HDL, LDLCALC, TRIG, CHOLHDL, LDLDIRECT in the last 72 hours. Thyroid  Function Tests: No results for input(s): TSH, T4TOTAL, FREET4, T3FREE, THYROIDAB in the last 72 hours. Anemia Panel: No results for input(s): VITAMINB12, FOLATE, FERRITIN, TIBC, IRON, RETICCTPCT in the last 72 hours. Sepsis Labs: No results for input(s): PROCALCITON, LATICACIDVEN in the last 168 hours.  Recent Results (from the past 240 hours)  Respiratory  (~20 pathogens) panel by PCR     Status: None   Collection Time: 04/14/23 10:30 PM   Specimen: Nasopharyngeal Swab; Respiratory  Result Value Ref Range Status   Adenovirus NOT DETECTED NOT DETECTED Final   Coronavirus 229E NOT DETECTED NOT DETECTED Final    Comment: (NOTE) The Coronavirus on the Respiratory Panel, DOES NOT test for the novel  Coronavirus (2019 nCoV)    Coronavirus HKU1 NOT DETECTED NOT DETECTED Final   Coronavirus NL63 NOT DETECTED NOT DETECTED Final   Coronavirus OC43 NOT DETECTED NOT DETECTED Final   Metapneumovirus NOT DETECTED NOT DETECTED Final   Rhinovirus / Enterovirus NOT DETECTED NOT DETECTED Final   Influenza A NOT DETECTED NOT DETECTED Final   Influenza B NOT DETECTED NOT DETECTED Final   Parainfluenza Virus 1 NOT DETECTED NOT DETECTED Final   Parainfluenza Virus 2 NOT DETECTED NOT  DETECTED Final   Parainfluenza Virus 3 NOT DETECTED NOT DETECTED Final   Parainfluenza Virus 4 NOT DETECTED NOT DETECTED Final   Respiratory Syncytial Virus NOT DETECTED NOT DETECTED Final   Bordetella pertussis NOT DETECTED NOT DETECTED Final   Bordetella Parapertussis NOT DETECTED NOT DETECTED Final   Chlamydophila pneumoniae NOT DETECTED NOT DETECTED Final   Mycoplasma pneumoniae NOT DETECTED NOT DETECTED Final    Comment: Performed at [facility], [address], [place], [place] [address]  SARS Coronavirus 2 by RT PCR (hospital order, performed in [facility] hospital lab) *cepheid single result test* Anterior Nasal Swab     Status: None   Collection Time: 04/14/23 10:30 PM   Specimen: Anterior Nasal Swab  Result Value Ref Range Status   SARS Coronavirus 2 by RT PCR NEGATIVE NEGATIVE Final    Comment: (NOTE) SARS-CoV-2 target nucleic acids are NOT DETECTED.  The SARS-CoV-2 RNA is generally detectable in upper and lower respiratory specimens during the acute phase of infection. The lowest concentration of SARS-CoV-2 viral copies this assay can detect is  250 copies / mL. A negative result does not preclude SARS-CoV-2 infection and should not be used as the sole basis for treatment or other patient management decisions.  A negative result may occur with improper specimen collection / handling, submission of specimen other than nasopharyngeal swab, presence of viral mutation(s) within the areas targeted by this assay, and inadequate number of viral copies (<250 copies / mL). A negative result must be combined with clinical observations, patient history, and epidemiological information.  Fact Sheet for Patients:   roadlaptop.co.za  Fact Sheet for Healthcare Providers: http://kim-miller.com/  This test is not yet approved or  cleared by the [place]  FDA and has been authorized for detection and/or diagnosis of SARS-CoV-2 by FDA under an Emergency Use Authorization (EUA).  This EUA will remain in effect (meaning this test can be used) for the duration of the COVID-19 declaration under Section 564(b)(1) of the Act, 21 U.S.C. section 360bbb-3(b)(1), unless the authorization is terminated or revoked sooner.  Performed at [facility], [address], [place], [place] [address]          Radiology Studies: CT Angio Chest PE W and/or Wo Contrast Result Date: 04/14/2023 CLINICAL DATA:  Positive D-dimer.  Shortness of breath. EXAM: CT ANGIOGRAPHY CHEST WITH CONTRAST TECHNIQUE: Multidetector CT imaging of the chest was performed using the standard protocol during bolus administration of intravenous contrast. Multiplanar CT image reconstructions and MIPs were obtained to evaluate the vascular anatomy. RADIATION DOSE REDUCTION: This exam was performed according to the departmental dose-optimization program which includes automated exposure control, adjustment of the mA and/or kV according to patient size and/or use of iterative reconstruction technique. CONTRAST:  OMNIPAQUE  IOHEXOL  350 MG/ML SOLN  COMPARISON:  CT angiogram chest 08/10/2019 FINDINGS: Cardiovascular: Heart is enlarged. There is no pericardial effusion. Aorta is normal in size. There are atherosclerotic calcifications of the aorta. There is adequate opacification of the pulmonary arteries to the segmental level. No pulmonary embolism identified. Mediastinum/Nodes: There are enlarged precarinal lymph nodes measuring up to 13 mm. There is an enlarged subcarinal lymph node measuring 11 mm. There are enlarged right hilar lymph nodes measuring up to 12 mm. These are all similar to prior study. Visualized thyroid  gland and esophagus are within normal limits. Lungs/Pleura: There are small bilateral pleural effusions similar to the prior examination. There is a new focal area of airspace disease with surrounding ground-glass opacity in the  superior segment of the right lower lobe and minimally in the adjacent right upper lobe. There also new patchy airspace and ground-glass opacities minimally in the central left upper lobe. Atelectatic changes are seen in the bilateral lower lobes, right middle lobe and lingula. There is no pneumothorax. Trachea and central airways are patent. There some new nodular densities in the left lower lobe measuring up to 5 mm on image 6/90. Upper Abdomen: No acute abnormality. Musculoskeletal: There is a rounded 17 mm nodular area in the inferior right breast which was not seen on the prior examination image 4/67. Right shoulder arthroplasty is present. No acute fractures are seen. Review of the MIP images confirms the above findings. IMPRESSION: 1. No evidence for pulmonary embolism. 2. New focal area of airspace disease with ground-glass opacities in the bilateral upper lobes. Findings are worrisome for multifocal pneumonia. 3. Stable small bilateral pleural effusions. 4. Stable mediastinal and right hilar lymphadenopathy. 5. New nodular densities in the left lower lobe measuring up to 5 mm. Non-contrast chest CT at 3-6  months is recommended. If the nodules are stable at time of repeat CT, then future CT at 18-24 months (from today's scan) is considered optional for low-risk patients, but is recommended for high-risk patients. 6. 17 mm nodular area in the inferior right breast was not seen on the prior examination. Correlate with mammography. 7. Aortic atherosclerosis. Aortic Atherosclerosis (ICD10-I70.0). Electronically Signed   By: Greig Pique M.D.   On: 04/14/2023 21:25   DG Chest Port 1 View Result Date: 04/14/2023 CLINICAL DATA:  Shortness of breath. EXAM: PORTABLE CHEST 1 VIEW COMPARISON:  05/16/2021 and CT chest 08/10/2019. FINDINGS: Patient is slightly rotated. Heart is enlarged and accentuated by semi upright AP technique. Thoracic aorta is calcified. Volume with possible mild basilar interstitial prominence. Slight blunting of the costophrenic angles. Right shoulder arthroplasty. IMPRESSION: Question mild congestive heart failure. Electronically Signed   By: Newell Eke M.D.   On: 04/14/2023 18:29        Scheduled Meds:  amLODipine   10 mg Oral Daily   atorvastatin   40 mg Oral QPC supper   azithromycin   500 mg Oral QHS   enoxaparin  (LOVENOX ) injection  40 mg Subcutaneous Q24H   famotidine   20 mg Oral Daily   fluticasone   2 spray Each Nare Daily   gabapentin   600 mg Oral TID   insulin  aspart  0-6 Units Subcutaneous TID WC   levothyroxine   50 mcg Oral Daily   pantoprazole   40 mg Oral QHS   Continuous Infusions:  cefTRIAXone  (ROCEPHIN )  IV       LOS: 1 day    Time spent: 55 minutes    Loriann Bosserman JONETTA Fairly, DO [facility]  If 7PM-7AM, please contact night-coverage [email or web address] 04/15/2023, 10:55 AM

## 2023-04-15 NOTE — Plan of Care (Signed)

## 2023-04-16 DIAGNOSIS — J9601 Acute respiratory failure with hypoxia: Secondary | ICD-10-CM | POA: Diagnosis not present

## 2023-04-16 LAB — CBC
HCT: 31.5 % — ABNORMAL LOW (ref 36.0–46.0)
Hemoglobin: 9.6 g/dL — ABNORMAL LOW (ref 12.0–15.0)
MCH: 31.6 pg (ref 26.0–34.0)
MCHC: 30.5 g/dL (ref 30.0–36.0)
MCV: 103.6 fL — ABNORMAL HIGH (ref 80.0–100.0)
Platelets: 160 10*3/uL (ref 150–400)
RBC: 3.04 MIL/uL — ABNORMAL LOW (ref 3.87–5.11)
RDW: 14 % (ref 11.5–15.5)
WBC: 8.5 10*3/uL (ref 4.0–10.5)
nRBC: 0 % (ref 0.0–0.2)

## 2023-04-16 LAB — GLUCOSE, CAPILLARY
Glucose-Capillary: 119 mg/dL — ABNORMAL HIGH (ref 70–99)
Glucose-Capillary: 159 mg/dL — ABNORMAL HIGH (ref 70–99)
Glucose-Capillary: 127 mg/dL — ABNORMAL HIGH (ref 70–99)
Glucose-Capillary: 141 mg/dL — ABNORMAL HIGH (ref 70–99)

## 2023-04-16 LAB — BASIC METABOLIC PANEL
Anion gap: 8 (ref 5–15)
BUN: 21 mg/dL (ref 8–23)
CO2: 37 mmol/L — ABNORMAL HIGH (ref 22–32)
Calcium: 9.1 mg/dL (ref 8.9–10.3)
Chloride: 94 mmol/L — ABNORMAL LOW (ref 98–111)
Creatinine, Ser: 0.97 mg/dL (ref 0.44–1.00)
GFR, Estimated: 55 mL/min — ABNORMAL LOW (ref >60)
Glucose, Bld: 129 mg/dL — ABNORMAL HIGH (ref 70–99)
Potassium: 4.1 mmol/L (ref 3.5–5.1)
Sodium: 139 mmol/L (ref 135–145)

## 2023-04-16 LAB — MAGNESIUM: Magnesium: 1.8 mg/dL (ref 1.7–2.4)

## 2023-04-16 NOTE — Progress Notes (Addendum)
 SATURATION QUALIFICATIONS: (This note is used to comply with regulatory documentation for home oxygen )  Patient Saturations on Room Air at Rest = 94%  Patient Saturations on Room Air while Ambulating = 84%  Patient Saturations on 4 Liters of oxygen  while Ambulating = 96%  Please briefly explain why patient needs home oxygen : Patient will need oxygen  as evidence when walking down hall her o2 sat dropped to 84% Room air and came back up to 96% on 4L via nasal canula

## 2023-04-16 NOTE — Plan of Care (Signed)
  Problem: Metabolic: Goal: Ability to maintain appropriate glucose levels will improve Outcome: Not Progressing   Problem: Nutritional: Goal: Maintenance of adequate nutrition will improve Outcome: Not Progressing   Problem: Skin Integrity: Goal: Risk for impaired skin integrity will decrease Outcome: Not Progressing   Problem: Tissue Perfusion: Goal: Adequacy of tissue perfusion will improve Outcome: Not Progressing   Problem: Clinical Measurements: Goal: Respiratory complications will improve Outcome: Not Progressing   Problem: Safety: Goal: Ability to remain free from injury will improve Outcome: Not Progressing

## 2023-04-16 NOTE — Progress Notes (Signed)
 Mobility Specialist Progress Note:    04/16/23 1020  Mobility  Activity Ambulated with assistance in hallway;Transferred from bed to chair  Level of Assistance Minimal assist, patient does 75% or more  Assistive Device Front wheel walker  Distance Ambulated (ft) 30 ft  Range of Motion/Exercises Active;All extremities  Activity Response Tolerated well  Mobility Referral Yes  Mobility visit 1 Mobility  Mobility Specialist Start Time (ACUTE ONLY) 1000  Mobility Specialist Stop Time (ACUTE ONLY) 1020  Mobility Specialist Time Calculation (min) (ACUTE ONLY) 20 min   Pt received in bed, agreeable to mobility. Required MinA to stand and ambulate with RW. Tolerated well, see O2 sats below. Returned to room and left pt in chair. Call bell in hand, all needs met.   SpO2 82% on RA at rest SpO2 94% on 3L at rest SpO2 90% on 3L during ambulation  Sherrilee Ditty Mobility Specialist Please contact via SecureChat or  Rehab office at [phone]

## 2023-04-16 NOTE — Progress Notes (Signed)
 Mobility Specialist Progress Note:    04/16/23 1443  Mobility  Activity Ambulated with assistance in hallway  Level of Assistance Contact guard assist, steadying assist  Assistive Device Front wheel walker  Distance Ambulated (ft) 45 ft  Range of Motion/Exercises Active;All extremities  Activity Response Tolerated well  Mobility Referral Yes  Mobility visit 1 Mobility  Mobility Specialist Start Time (ACUTE ONLY) 1415  Mobility Specialist Stop Time (ACUTE ONLY) 1435  Mobility Specialist Time Calculation (min) (ACUTE ONLY) 20 min   Pt received in chair, RN and NT in room. Required CGA to stand an dambulate with RW. Tolerated well, see O2 sats below. Returned to chair, call bell in reach. Left pt with  on 4L. All needs met.   SPO2 94% on RA at rest SpO2 84% on RA during ambulation SpO2 96% on 4L during ambulation  Sherrilee Ditty Mobility Specialist Please contact via SecureChat or  Rehab office at [phone]

## 2023-04-16 NOTE — Progress Notes (Signed)
 PROGRESS NOTE    Sandra Cuevas  FMW:[id number] DOB: March 26, 1929 DOA: 04/14/2023 PCP: Marvine Rush, MD  No chief complaint on file.   Brief Narrative:   Sandra Cuevas is Sandra Cuevas 89 y.o. female with hx of COPD/asthma, HFpEF, hypertension, hyperlipidemia, diabetes, hypothyroidism, remote GYN cancer status post hysterectomy who presented due to worsening shortness of breath and hypoxia at home.  Patient was admitted with acute hypoxemic respiratory failure secondary to multifocal pneumonia.   Assessment & Plan:   Principal Problem:   Acute hypoxic respiratory failure (HCC)  Multifocal pneumonia, suspect viral Acute hypoxic respiratory failure, requiring 2 L O2 - CT chest with evidence of multifocal pneumonia, small bilateral effusions - Continue ceftriaxone , azithromycin  - Negative covid/flu, sputum culture - wean O2 as tolerated  Troponin elevation Likely demand ischemia with flat trend - Treatment directed at pneumonia, hypoxia per above   Incidental findings:  Lung nodule left lower lobe up to the 5 mm: Recommended for repeat CT in 3 to 6 months (if within goals of care) Mediastinal and hilar adenopathy which is stable. 17 mm nodular area in inferior right breast - needs follow up with mammogram outpatient  Chronic medical problems: COPD/asthma: Albuterol  as needed, continue home Singulair  HFpEF, without acute exacerbation: Status post dose of Lasix  40 mg IV in the ED.  Continue home Lasix  20 mg p.o. Hypertension: Continue home amlodipine  Hyperlipidemia: Continue home atorvastatin  Diabetes: SSI while inpatient Neuropathy: Continue her home gabapentin  Hypothyroidism: Continue her home levothyroxine  History of remote gyn cancer with hysterectomy: Outpatient surveillance.   Body mass index is 31.88 kg/m. Obesity class I affecting medical care       DVT prophylaxis: lovenox  Code Status: DNR Family Communication: none Disposition:   Status is: Inpatient Remains inpatient  appropriate because: need for continued inpatient care   Consultants:  none  Procedures:  none  Antimicrobials:  Anti-infectives (From admission, onward)    Start     Dose/Rate Route Frequency Ordered Stop   04/15/23 2300  cefTRIAXone  (ROCEPHIN ) 1 g in sodium chloride  0.9 % 100 mL IVPB        1 g 200 mL/hr over 30 Minutes Intravenous Every 24 hours 04/14/23 2217 04/19/23 2259   04/15/23 2200  azithromycin  (ZITHROMAX ) tablet 500 mg        500 mg Oral Daily at bedtime 04/14/23 2217 04/17/23 2159   04/14/23 2230  azithromycin  (ZITHROMAX ) 500 mg in sodium chloride  0.9 % 250 mL IVPB        500 mg 250 mL/hr over 60 Minutes Intravenous  Once 04/14/23 2217 04/15/23 0130   04/14/23 2145  cefTRIAXone  (ROCEPHIN ) 2 g in sodium chloride  0.9 % 100 mL IVPB        2 g 200 mL/hr over 30 Minutes Intravenous  Once 04/14/23 2135 04/14/23 2349   04/14/23 2145  azithromycin  (ZITHROMAX ) 500 mg in sodium chloride  0.9 % 250 mL IVPB  Status:  Discontinued        500 mg 250 mL/hr over 60 Minutes Intravenous Every 24 hours 04/14/23 2135 04/14/23 2217       Subjective: Feels about the same, breathing no better  Objective: Vitals:   04/15/23 2208 04/16/23 0438 04/16/23 0500 04/16/23 1406  BP:  (!) 158/56  (!) 141/41  Pulse: 83 83  (!) 59  Resp:  20  19  Temp:  98.3 F (36.8 C)    TempSrc:      SpO2: 93% 94%  94%  Weight:   90.5 kg  Height:        Intake/Output Summary (Last 24 hours) at 04/16/2023 1637 Last data filed at 04/16/2023 1300 Gross per 24 hour  Intake 1362.89 ml  Output 800 ml  Net 562.89 ml   Filed Weights   04/14/23 1658 04/16/23 0500  Weight: 89.6 kg 90.5 kg    Examination:  General exam: Appears calm and comfortable - sitting up in chair Respiratory system: diminished, unlabored Cardiovascular system: RRR Gastrointestinal system: Abdomen is nondistended, soft and nontender.  Central nervous system: Alert and oriented. No focal neurological deficits. Extremities: no  LEE    Data Reviewed: I have personally reviewed following labs and imaging studies  CBC: Recent Labs  Lab 04/14/23 1833 04/15/23 0414 04/16/23 0422  WBC 8.3 9.1 8.5  NEUTROABS 5.6  --   --   HGB 10.2* 10.2* 9.6*  HCT 33.4* 33.7* 31.5*  MCV 105.0* 104.0* 103.6*  PLT 146* 153 160    Basic Metabolic Panel: Recent Labs  Lab 04/14/23 1833 04/15/23 0414 04/16/23 0422  NA 140 138 139  K 3.7 3.9 4.1  CL 103 92* 94*  CO2 30 38* 37*  GLUCOSE 141* 132* 129*  BUN 18 14 21   CREATININE 0.73 0.79 0.97  CALCIUM  8.4* 9.4 9.1  MG  --  1.5* 1.8  PHOS  --  4.4  --     GFR: Estimated Creatinine Clearance: 41.9 mL/min (by C-G formula based on SCr of 0.97 mg/dL).  Liver Function Tests: Recent Labs  Lab 04/14/23 1833  AST 23  ALT 21  ALKPHOS 67  BILITOT 0.2  PROT 5.7*  ALBUMIN 2.9*    CBG: Recent Labs  Lab 04/15/23 1603 04/15/23 2155 04/16/23 0713 04/16/23 1122 04/16/23 1605  GLUCAP 160* 122* 119* 159* 127*     Recent Results (from the past 240 hours)  Respiratory (~20 pathogens) panel by PCR     Status: None   Collection Time: 04/14/23 10:30 PM   Specimen: Nasopharyngeal Swab; Respiratory  Result Value Ref Range Status   Adenovirus NOT DETECTED NOT DETECTED Final   Coronavirus 229E NOT DETECTED NOT DETECTED Final    Comment: (NOTE) The Coronavirus on the Respiratory Panel, DOES NOT test for the novel  Coronavirus (2019 nCoV)    Coronavirus HKU1 NOT DETECTED NOT DETECTED Final   Coronavirus NL63 NOT DETECTED NOT DETECTED Final   Coronavirus OC43 NOT DETECTED NOT DETECTED Final   Metapneumovirus NOT DETECTED NOT DETECTED Final   Rhinovirus / Enterovirus NOT DETECTED NOT DETECTED Final   Influenza Trebor Galdamez NOT DETECTED NOT DETECTED Final   Influenza B NOT DETECTED NOT DETECTED Final   Parainfluenza Virus 1 NOT DETECTED NOT DETECTED Final   Parainfluenza Virus 2 NOT DETECTED NOT DETECTED Final   Parainfluenza Virus 3 NOT DETECTED NOT DETECTED Final   Parainfluenza  Virus 4 NOT DETECTED NOT DETECTED Final   Respiratory Syncytial Virus NOT DETECTED NOT DETECTED Final   Bordetella pertussis NOT DETECTED NOT DETECTED Final   Bordetella Parapertussis NOT DETECTED NOT DETECTED Final   Chlamydophila pneumoniae NOT DETECTED NOT DETECTED Final   Mycoplasma pneumoniae NOT DETECTED NOT DETECTED Final    Comment: Performed at [facility], [address], [place], [place] [address]  SARS Coronavirus 2 by RT PCR (hospital order, performed in [facility] hospital lab) *cepheid single result test* Anterior Nasal Swab     Status: None   Collection Time: 04/14/23 10:30 PM   Specimen: Anterior Nasal Swab  Result Value Ref Range Status  SARS Coronavirus 2 by RT PCR NEGATIVE NEGATIVE Final    Comment: (NOTE) SARS-CoV-2 target nucleic acids are NOT DETECTED.  The SARS-CoV-2 RNA is generally detectable in upper and lower respiratory specimens during the acute phase of infection. The lowest concentration of SARS-CoV-2 viral copies this assay can detect is 250 copies / mL. Laverne Klugh negative result does not preclude SARS-CoV-2 infection and should not be used as the sole basis for treatment or other patient management decisions.  Analleli Gierke negative result may occur with improper specimen collection / handling, submission of specimen other than nasopharyngeal swab, presence of viral mutation(s) within the areas targeted by this assay, and inadequate number of viral copies (<250 copies / mL). Darika Ildefonso negative result must be combined with clinical observations, patient history, and epidemiological information.  Fact Sheet for Patients:   roadlaptop.co.za  Fact Sheet for Healthcare Providers: http://kim-miller.com/  This test is not yet approved or  cleared by the [place]  FDA and has been authorized for detection and/or diagnosis of SARS-CoV-2 by FDA under an Emergency Use Authorization (EUA).  This EUA will remain in effect (meaning  this test can be used) for the duration of the COVID-19 declaration under Section 564(b)(1) of the Act, 21 U.S.C. section 360bbb-3(b)(1), unless the authorization is terminated or revoked sooner.  Performed at [facility], [address] [place], [place] [address]          Radiology Studies: CT Angio Chest PE W and/or Wo Contrast Result Date: 04/14/2023 CLINICAL DATA:  Positive D-dimer.  Shortness of breath. EXAM: CT ANGIOGRAPHY CHEST WITH CONTRAST TECHNIQUE: Multidetector CT imaging of the chest was performed using the standard protocol during bolus administration of intravenous contrast. Multiplanar CT image reconstructions and MIPs were obtained to evaluate the vascular anatomy. RADIATION DOSE REDUCTION: This exam was performed according to the departmental dose-optimization program which includes automated exposure control, adjustment of the mA and/or kV according to patient size and/or use of iterative reconstruction technique. CONTRAST:  OMNIPAQUE  IOHEXOL  350 MG/ML SOLN COMPARISON:  CT angiogram chest 08/10/2019 FINDINGS: Cardiovascular: Heart is enlarged. There is no pericardial effusion. Aorta is normal in size. There are atherosclerotic calcifications of the aorta. There is adequate opacification of the pulmonary arteries to the segmental level. No pulmonary embolism identified. Mediastinum/Nodes: There are enlarged precarinal lymph nodes measuring up to 13 mm. There is an enlarged subcarinal lymph node measuring 11 mm. There are enlarged right hilar lymph nodes measuring up to 12 mm. These are all similar to prior study. Visualized thyroid  gland and esophagus are within normal limits. Lungs/Pleura: There are small bilateral pleural effusions similar to the prior examination. There is Bradleigh Sonnen new focal area of airspace disease with surrounding ground-glass opacity in the superior segment of the right lower lobe and minimally in the adjacent right upper lobe. There also new patchy airspace and  ground-glass opacities minimally in the central left upper lobe. Atelectatic changes are seen in the bilateral lower lobes, right middle lobe and lingula. There is no pneumothorax. Trachea and central airways are patent. There some new nodular densities in the left lower lobe measuring up to 5 mm on image 6/90. Upper Abdomen: No acute abnormality. Musculoskeletal: There is Doranne Schmutz rounded 17 mm nodular area in the inferior right breast which was not seen on the prior examination image 4/67. Right shoulder arthroplasty is present. No acute fractures are seen. Review of the MIP images confirms the above findings. IMPRESSION: 1. No evidence for pulmonary embolism. 2. New focal area of airspace disease  with ground-glass opacities in the bilateral upper lobes. Findings are worrisome for multifocal pneumonia. 3. Stable small bilateral pleural effusions. 4. Stable mediastinal and right hilar lymphadenopathy. 5. New nodular densities in the left lower lobe measuring up to 5 mm. Non-contrast chest CT at 3-6 months is recommended. If the nodules are stable at time of repeat CT, then future CT at 18-24 months (from today's scan) is considered optional for low-risk patients, but is recommended for high-risk patients. 6. 17 mm nodular area in the inferior right breast was not seen on the prior examination. Correlate with mammography. 7. Aortic atherosclerosis. Aortic Atherosclerosis (ICD10-I70.0). Electronically Signed   By: Greig Pique M.D.   On: 04/14/2023 21:25   DG Chest Port 1 View Result Date: 04/14/2023 CLINICAL DATA:  Shortness of breath. EXAM: PORTABLE CHEST 1 VIEW COMPARISON:  05/16/2021 and CT chest 08/10/2019. FINDINGS: Patient is slightly rotated. Heart is enlarged and accentuated by semi upright AP technique. Thoracic aorta is calcified. Volume with possible mild basilar interstitial prominence. Slight blunting of the costophrenic angles. Right shoulder arthroplasty. IMPRESSION: Question mild congestive heart  failure. Electronically Signed   By: Newell Eke M.D.   On: 04/14/2023 18:29        Scheduled Meds:  amLODipine   10 mg Oral Daily   atorvastatin   40 mg Oral QPC supper   azithromycin   500 mg Oral QHS   enoxaparin  (LOVENOX ) injection  40 mg Subcutaneous Q24H   famotidine   20 mg Oral Daily   fluticasone   2 spray Each Nare Daily   gabapentin   600 mg Oral TID   insulin  aspart  0-6 Units Subcutaneous TID WC   levothyroxine   50 mcg Oral Daily   pantoprazole   40 mg Oral QHS   Continuous Infusions:  cefTRIAXone  (ROCEPHIN )  IV Stopped (04/15/23 2246)     LOS: 2 days    Time spent: over 30 min    Meliton Monte, MD [facility]   To contact the attending provider between 7A-7P or the covering provider during after hours 7P-7A, please log into the web site [email or web address] and access using universal [place] password for that web site. If you do not have the password, please call the hospital operator.  04/16/2023, 4:37 PM

## 2023-04-16 NOTE — Plan of Care (Signed)
   Problem: Education: Goal: Ability to describe self-care measures that may prevent or decrease complications (Diabetes Survival Skills Education) will improve Outcome: Progressing Goal: Individualized Educational Video(s) Outcome: Progressing

## 2023-04-17 DIAGNOSIS — J9601 Acute respiratory failure with hypoxia: Secondary | ICD-10-CM | POA: Diagnosis not present

## 2023-04-17 LAB — BLOOD GAS, VENOUS
Acid-Base Excess: 14.9 mmol/L — ABNORMAL HIGH (ref 0.0–2.0)
Bicarbonate: 43.8 mmol/L — ABNORMAL HIGH (ref 20.0–28.0)
Drawn by: 6509
O2 Saturation: 99.1 %
Patient temperature: 36.7
pCO2, Ven: 73 mm[Hg] (ref 44–60)
pH, Ven: 7.38 (ref 7.25–7.43)
pO2, Ven: 111 mm[Hg] — ABNORMAL HIGH (ref 32–45)

## 2023-04-17 LAB — GLUCOSE, CAPILLARY
Glucose-Capillary: 138 mg/dL — ABNORMAL HIGH (ref 70–99)
Glucose-Capillary: 142 mg/dL — ABNORMAL HIGH (ref 70–99)
Glucose-Capillary: 117 mg/dL — ABNORMAL HIGH (ref 70–99)
Glucose-Capillary: 173 mg/dL — ABNORMAL HIGH (ref 70–99)

## 2023-04-17 LAB — HEMOGLOBIN A1C
Hgb A1c MFr Bld: 6.6 % — ABNORMAL HIGH (ref 4.8–5.6)
Mean Plasma Glucose: 143 mg/dL

## 2023-04-17 NOTE — Progress Notes (Signed)
 PROGRESS NOTE    Sandra Cuevas  FMW:[id number] DOB: [birth year] DOA: 04/14/2023 PCP: Marvine Rush, MD  No chief complaint on file.   Brief Narrative:   Sandra Cuevas is Sandra Cuevas 89 y.o. female with hx of COPD/asthma, HFpEF, hypertension, hyperlipidemia, diabetes, hypothyroidism, remote GYN cancer status post hysterectomy who presented due to worsening shortness of breath and hypoxia at home.  Patient was admitted with acute hypoxemic respiratory failure secondary to multifocal pneumonia.   Assessment & Plan:   Principal Problem:   Acute hypoxic respiratory failure (HCC)  Multifocal pneumonia, suspect viral Acute hypoxic respiratory failure, requiring 3-4 L O2 - CT chest with evidence of multifocal pneumonia, small bilateral effusions - Continue ceftriaxone , azithromycin  - Negative covid/flu, sputum culture - wean O2 as tolerated  Dysphagia - will request SLP eval  Troponin elevation Likely demand ischemia with flat trend - Treatment directed at pneumonia, hypoxia per above   Incidental findings:  Lung nodule left lower lobe up to the 5 mm: Recommended for repeat CT in 3 to 6 months (if within goals of care) Mediastinal and hilar adenopathy which is stable. 17 mm nodular area in inferior right breast - needs follow up with mammogram outpatient  Chronic medical problems: COPD/asthma: Albuterol  as needed, continue home Singulair  HFpEF, without acute exacerbation: Status post dose of Lasix  40 mg IV in the ED.  Continue home Lasix  20 mg p.o. Hypertension: Continue home amlodipine  Hyperlipidemia: Continue home atorvastatin  Diabetes: SSI while inpatient Neuropathy: Continue her home gabapentin  Hypothyroidism: Continue her home levothyroxine  History of remote gyn cancer with hysterectomy: Outpatient surveillance.   Body mass index is 31.88 kg/m. Obesity class I affecting medical care       DVT prophylaxis: lovenox  Code Status: DNR Family Communication: none Disposition:    Status is: Inpatient Remains inpatient appropriate because: need for continued inpatient care   Consultants:  none  Procedures:  none  Antimicrobials:  Anti-infectives (From admission, onward)    Start     Dose/Rate Route Frequency Ordered Stop   04/15/23 2300  cefTRIAXone  (ROCEPHIN ) 1 g in sodium chloride  0.9 % 100 mL IVPB        1 g 200 mL/hr over 30 Minutes Intravenous Every 24 hours 04/14/23 2217 04/19/23 2259   04/15/23 2200  azithromycin  (ZITHROMAX ) tablet 500 mg        500 mg Oral Daily at bedtime 04/14/23 2217 04/16/23 2112   04/14/23 2230  azithromycin  (ZITHROMAX ) 500 mg in sodium chloride  0.9 % 250 mL IVPB        500 mg 250 mL/hr over 60 Minutes Intravenous  Once 04/14/23 2217 04/15/23 0130   04/14/23 2145  cefTRIAXone  (ROCEPHIN ) 2 g in sodium chloride  0.9 % 100 mL IVPB        2 g 200 mL/hr over 30 Minutes Intravenous  Once 04/14/23 2135 04/14/23 2349   04/14/23 2145  azithromycin  (ZITHROMAX ) 500 mg in sodium chloride  0.9 % 250 mL IVPB  Status:  Discontinued        500 mg 250 mL/hr over 60 Minutes Intravenous Every 24 hours 04/14/23 2135 04/14/23 2217       Subjective: No new complaints Son at bedside  Objective: Vitals:   04/17/23 0300 04/17/23 0434 04/17/23 0836 04/17/23 1417  BP:  (!) 130/42 (!) 159/48 (!) 142/65  Pulse:  75  (!) 57  Resp:  18  17  Temp:  98 F (36.7 C)    TempSrc:      SpO2:  96%  94%  Weight: 87.4 kg     Height:        Intake/Output Summary (Last 24 hours) at 04/17/2023 1419 Last data filed at 04/17/2023 0400 Gross per 24 hour  Intake 120 ml  Output 1000 ml  Net -880 ml   Filed Weights   04/14/23 1658 04/16/23 0500 04/17/23 0300  Weight: 89.6 kg 90.5 kg 87.4 kg    Examination:  General: No acute distress. Cardiovascular: RRR Lungs: diminished, unlabored Abdomen: Soft, nontender, nondistended Neurological: Alert and oriented 3. Moves all extremities 4 with equal strength. Cranial nerves II through XII grossly  intact. Extremities: No clubbing or cyanosis. No edema.    Data Reviewed: I have personally reviewed following labs and imaging studies  CBC: Recent Labs  Lab 04/14/23 1833 04/15/23 0414 04/16/23 0422  WBC 8.3 9.1 8.5  NEUTROABS 5.6  --   --   HGB 10.2* 10.2* 9.6*  HCT 33.4* 33.7* 31.5*  MCV 105.0* 104.0* 103.6*  PLT 146* 153 160    Basic Metabolic Panel: Recent Labs  Lab 04/14/23 1833 04/15/23 0414 04/16/23 0422  NA 140 138 139  K 3.7 3.9 4.1  CL 103 92* 94*  CO2 30 38* 37*  GLUCOSE 141* 132* 129*  BUN 18 14 21   CREATININE 0.73 0.79 0.97  CALCIUM  8.4* 9.4 9.1  MG  --  1.5* 1.8  PHOS  --  4.4  --     GFR: Estimated Creatinine Clearance: 41.2 mL/min (by C-G formula based on SCr of 0.97 mg/dL).  Liver Function Tests: Recent Labs  Lab 04/14/23 1833  AST 23  ALT 21  ALKPHOS 67  BILITOT 0.2  PROT 5.7*  ALBUMIN 2.9*    CBG: Recent Labs  Lab 04/16/23 1122 04/16/23 1605 04/16/23 2104 04/17/23 0753 04/17/23 1137  GLUCAP 159* 127* 141* 138* 142*     Recent Results (from the past 240 hours)  Respiratory (~20 pathogens) panel by PCR     Status: None   Collection Time: 04/14/23 10:30 PM   Specimen: Nasopharyngeal Swab; Respiratory  Result Value Ref Range Status   Adenovirus NOT DETECTED NOT DETECTED Final   Coronavirus 229E NOT DETECTED NOT DETECTED Final    Comment: (NOTE) The Coronavirus on the Respiratory Panel, DOES NOT test for the novel  Coronavirus (2019 nCoV)    Coronavirus HKU1 NOT DETECTED NOT DETECTED Final   Coronavirus NL63 NOT DETECTED NOT DETECTED Final   Coronavirus OC43 NOT DETECTED NOT DETECTED Final   Metapneumovirus NOT DETECTED NOT DETECTED Final   Rhinovirus / Enterovirus NOT DETECTED NOT DETECTED Final   Influenza Lena Fieldhouse NOT DETECTED NOT DETECTED Final   Influenza B NOT DETECTED NOT DETECTED Final   Parainfluenza Virus 1 NOT DETECTED NOT DETECTED Final   Parainfluenza Virus 2 NOT DETECTED NOT DETECTED Final   Parainfluenza  Virus 3 NOT DETECTED NOT DETECTED Final   Parainfluenza Virus 4 NOT DETECTED NOT DETECTED Final   Respiratory Syncytial Virus NOT DETECTED NOT DETECTED Final   Bordetella pertussis NOT DETECTED NOT DETECTED Final   Bordetella Parapertussis NOT DETECTED NOT DETECTED Final   Chlamydophila pneumoniae NOT DETECTED NOT DETECTED Final   Mycoplasma pneumoniae NOT DETECTED NOT DETECTED Final    Comment: Performed at [facility], [address] [place], [place] [address]  SARS Coronavirus 2 by RT PCR (hospital order, performed in [facility] hospital lab) *cepheid single result test* Anterior Nasal Swab     Status: None   Collection Time: 04/14/23 10:30 PM   Specimen:  Anterior Nasal Swab  Result Value Ref Range Status   SARS Coronavirus 2 by RT PCR NEGATIVE NEGATIVE Final    Comment: (NOTE) SARS-CoV-2 target nucleic acids are NOT DETECTED.  The SARS-CoV-2 RNA is generally detectable in upper and lower respiratory specimens during the acute phase of infection. The lowest concentration of SARS-CoV-2 viral copies this assay can detect is 250 copies / mL. Damian Hofstra negative result does not preclude SARS-CoV-2 infection and should not be used as the sole basis for treatment or other patient management decisions.  Aisha Greenberger negative result may occur with improper specimen collection / handling, submission of specimen other than nasopharyngeal swab, presence of viral mutation(s) within the areas targeted by this assay, and inadequate number of viral copies (<250 copies / mL). Samual Beals negative result must be combined with clinical observations, patient history, and epidemiological information.  Fact Sheet for Patients:   roadlaptop.co.za  Fact Sheet for Healthcare Providers: http://kim-miller.com/  This test is not yet approved or  cleared by the [place]  FDA and has been authorized for detection and/or diagnosis of SARS-CoV-2 by FDA under an Emergency Use  Authorization (EUA).  This EUA will remain in effect (meaning this test can be used) for the duration of the COVID-19 declaration under Section 564(b)(1) of the Act, 21 U.S.C. section 360bbb-3(b)(1), unless the authorization is terminated or revoked sooner.  Performed at [facility], [address], [place], [place] [address]          Radiology Studies: No results found.       Scheduled Meds:  amLODipine   10 mg Oral Daily   atorvastatin   40 mg Oral QPC supper   enoxaparin  (LOVENOX ) injection  40 mg Subcutaneous Q24H   famotidine   20 mg Oral Daily   fluticasone   2 spray Each Nare Daily   gabapentin   600 mg Oral TID   insulin  aspart  0-6 Units Subcutaneous TID WC   levothyroxine   50 mcg Oral Daily   pantoprazole   40 mg Oral QHS   Continuous Infusions:  cefTRIAXone  (ROCEPHIN )  IV 1 g (04/16/23 2233)     LOS: 3 days    Time spent: over 30 min    Meliton Monte, MD [facility]   To contact the attending provider between 7A-7P or the covering provider during after hours 7P-7A, please log into the web site [email or web address] and access using universal [place] password for that web site. If you do not have the password, please call the hospital operator.  04/17/2023, 2:19 PM

## 2023-04-17 NOTE — Evaluation (Signed)
 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    COPD (chronic obstructive pulmonary disease) (HCC)    Diabetes mellitus    GERD (gastroesophageal reflux disease)    History of cardiovascular stress test 06/01/2010   EF 71% - no evidence of ischemia, normal left ventricular systolic function   History of hysterectomy 1965   Hyperlipidemia    Hypertension    Hypothyroidism    Shortness of breath    on exertion   Tubular adenoma of colon    Past Surgical History:  Past Surgical History:  Procedure Laterality Date   ABDOMINAL HYSTERECTOMY     CARDIAC SURGERY     catherization   HEMORRHOID SURGERY     TOTAL SHOULDER REPLACEMENT Right    HPI:  Sandra Cuevas is a 89 y.o. female with hx of COPD/asthma, HFpEF, hypertension, hyperlipidemia, diabetes, hypothyroidism, remote GYN cancer status post hysterectomy who presented due to worsening shortness of breath and hypoxia at home.  Patient was admitted with acute hypoxemic respiratory failure secondary to multifocal pneumonia. BSE requested.    Assessment / Plan / Recommendation  Clinical Impression  Clinical swallow evaluation completed at bedside with Pt's daughter present. Pt presents with significant hoarseness with glottal fry, which has been present for the past few years. She saw ENT in 2023 and had neck CT. Pt has a history of reflux. Oral motor examination is WNL. She typically consumes regular textures and thin liquids without incident, but endorses difficulty getting pills to go down and coughing up mucous every morning. She also indicates that her hearing  has gotten significantly worse. Pt does not exhibit any signs or symptoms of aspiration. Given h/o reflux and reports of globus with pills, recommend adherence to reflux precautions, sleep with HOB elevated as able, and follow pills in water with a bite of applesauce or yogurt to help with pill transit through the esophagus. No further SLP services indicated at this time. SLP will sign off with recommendation for regular textures and thin liquids with aspiration and reflux precautions. SLP Visit Diagnosis: Dysphagia, unspecified (R13.10)    Aspiration Risk  No limitations    Diet Recommendation Regular;Thin liquid    Liquid Administration via: Cup;Straw Medication Administration: Whole meds with liquid Supervision: Patient able to self feed;Intermittent supervision to cue for compensatory strategies Postural Changes: Seated upright at 90 degrees;Remain upright for at least 30 minutes after po intake    Other  Recommendations Oral Care Recommendations: Oral care BID;Staff/trained caregiver to provide oral care    Recommendations for follow up therapy are one component of a multi-disciplinary discharge planning process, led by the attending physician.  Recommendations may be updated based on patient status, additional functional criteria and insurance authorization.  Follow up Recommendations No SLP follow up      Assistance Recommended at Discharge    Functional Status Assessment Patient has not had a recent decline in their functional status  Frequency and Duration            Prognosis        Swallow Study  General Date of Onset: 04/14/23 HPI: Sandra Cuevas is a 89 y.o. female with hx of COPD/asthma, HFpEF, hypertension, hyperlipidemia, diabetes, hypothyroidism, remote GYN cancer status post hysterectomy who presented due to worsening shortness of breath and hypoxia at home.  Patient was admitted with acute hypoxemic respiratory failure secondary to multifocal pneumonia. BSE  requested. Type of Study: Bedside Swallow Evaluation Previous Swallow Assessment: N/A Diet Prior to this Study: Regular;Thin liquids (Level 0) Temperature Spikes Noted: No Respiratory Status: Nasal cannula History of Recent Intubation: No Behavior/Cognition: Alert;Cooperative;Pleasant mood Oral Cavity Assessment: Within Functional Limits Oral Care Completed by SLP: No Oral Cavity - Dentition: Adequate natural dentition Vision: Functional for self-feeding Self-Feeding Abilities: Able to feed self Patient Positioning: Upright in bed Baseline Vocal Quality:  (hoarseness) Volitional Cough: Strong Volitional Swallow: Able to elicit    Oral/Motor/Sensory Function Overall Oral Motor/Sensory Function: Within functional limits   Ice Chips Ice chips: Within functional limits Presentation: Spoon   Thin Liquid Thin Liquid: Within functional limits Presentation: Cup;Self Fed;Straw    Nectar Thick Nectar Thick Liquid: Not tested   Honey Thick Honey Thick Liquid: Not tested   Puree Puree: Within functional limits Presentation: Spoon   Solid     Solid: Within functional limits Presentation: Self Fed     Thank you,  Sandra Cuevas, CCC-SLP [phone]  Sandra Cuevas 04/17/2023,4:22 PM

## 2023-04-17 NOTE — Progress Notes (Addendum)
 Mobility Specialist Progress Note:    04/17/23 1400  Mobility  Activity Dangled on edge of bed;Ambulated with assistance in hallway  Level of Assistance Contact guard assist, steadying assist  Assistive Device Front wheel walker  Distance Ambulated (ft) 45 ft  Range of Motion/Exercises Active;All extremities  Activity Response Tolerated well  Mobility Referral Yes  Mobility visit 1 Mobility  Mobility Specialist Start Time (ACUTE ONLY) 1335  Mobility Specialist Stop Time (ACUTE ONLY) 1400  Mobility Specialist Time Calculation (min) (ACUTE ONLY) 25 min   Pt received in bed, friend in room. Agreeable to mobility, required CGA to stand and ambulate with RW. Tolerated well, pt asked to check O2 on RA. SpO2 84% on RA after sitting EOB for 3 minutes.Denies SOB. SpO2 93% on 2L sitting EOB. SpO2 88% on 2L during ambulation. Returned to room, SpO2 96% 3L after session. Left pt sitting EOB, all needs met.   Sherrilee Ditty Mobility Specialist Please contact via Special Educational Needs Teacher or  Rehab office at [phone]

## 2023-04-18 ENCOUNTER — Inpatient Hospital Stay (HOSPITAL_COMMUNITY): Payer: PPO

## 2023-04-18 DIAGNOSIS — J9601 Acute respiratory failure with hypoxia: Secondary | ICD-10-CM | POA: Diagnosis not present

## 2023-04-18 LAB — CBC WITH DIFFERENTIAL/PLATELET
Abs Immature Granulocytes: 0.03 10*3/uL (ref 0.00–0.07)
Basophils Absolute: 0 10*3/uL (ref 0.0–0.1)
Basophils Relative: 0 %
Eosinophils Absolute: 0.3 10*3/uL (ref 0.0–0.5)
Eosinophils Relative: 4 %
HCT: 30.2 % — ABNORMAL LOW (ref 36.0–46.0)
Hemoglobin: 9.2 g/dL — ABNORMAL LOW (ref 12.0–15.0)
Immature Granulocytes: 0 %
Lymphocytes Relative: 22 %
Lymphs Abs: 1.7 10*3/uL (ref 0.7–4.0)
MCH: 31.6 pg (ref 26.0–34.0)
MCHC: 30.5 g/dL (ref 30.0–36.0)
MCV: 103.8 fL — ABNORMAL HIGH (ref 80.0–100.0)
Monocytes Absolute: 1 10*3/uL (ref 0.1–1.0)
Monocytes Relative: 13 %
Neutro Abs: 4.7 10*3/uL (ref 1.7–7.7)
Neutrophils Relative %: 61 %
Platelets: 156 10*3/uL (ref 150–400)
RBC: 2.91 MIL/uL — ABNORMAL LOW (ref 3.87–5.11)
RDW: 13.8 % (ref 11.5–15.5)
WBC: 7.8 10*3/uL (ref 4.0–10.5)
nRBC: 0 % (ref 0.0–0.2)

## 2023-04-18 LAB — GLUCOSE, CAPILLARY
Glucose-Capillary: 123 mg/dL — ABNORMAL HIGH (ref 70–99)
Glucose-Capillary: 156 mg/dL — ABNORMAL HIGH (ref 70–99)
Glucose-Capillary: 178 mg/dL — ABNORMAL HIGH (ref 70–99)
Glucose-Capillary: 145 mg/dL — ABNORMAL HIGH (ref 70–99)

## 2023-04-18 LAB — MAGNESIUM: Magnesium: 1.9 mg/dL (ref 1.7–2.4)

## 2023-04-18 LAB — COMPREHENSIVE METABOLIC PANEL
ALT: 22 U/L (ref 0–44)
AST: 25 U/L (ref 15–41)
Albumin: 2.6 g/dL — ABNORMAL LOW (ref 3.5–5.0)
Alkaline Phosphatase: 55 U/L (ref 38–126)
Anion gap: 5 (ref 5–15)
BUN: 28 mg/dL — ABNORMAL HIGH (ref 8–23)
CO2: 36 mmol/L — ABNORMAL HIGH (ref 22–32)
Calcium: 8.6 mg/dL — ABNORMAL LOW (ref 8.9–10.3)
Chloride: 95 mmol/L — ABNORMAL LOW (ref 98–111)
Creatinine, Ser: 1 mg/dL (ref 0.44–1.00)
GFR, Estimated: 53 mL/min — ABNORMAL LOW (ref >60)
Glucose, Bld: 119 mg/dL — ABNORMAL HIGH (ref 70–99)
Potassium: 4.4 mmol/L (ref 3.5–5.1)
Sodium: 136 mmol/L (ref 135–145)
Total Bilirubin: 0.5 mg/dL (ref 0.0–1.2)
Total Protein: 5.7 g/dL — ABNORMAL LOW (ref 6.5–8.1)

## 2023-04-18 LAB — PHOSPHORUS: Phosphorus: 3.4 mg/dL (ref 2.5–4.6)

## 2023-04-18 LAB — BRAIN NATRIURETIC PEPTIDE: B Natriuretic Peptide: 48 pg/mL (ref 0.0–100.0)

## 2023-04-18 MED ORDER — FUROSEMIDE 10 MG/ML IJ SOLN
20.0000 mg | Freq: Once | INTRAMUSCULAR | Status: AC
  Administered 2023-04-18: 20 mg via INTRAVENOUS
  Filled 2023-04-18: qty 2

## 2023-04-18 MED ORDER — ZOLPIDEM TARTRATE 5 MG PO TABS
2.5000 mg | ORAL_TABLET | Freq: Every evening | ORAL | Status: DC | PRN
  Administered 2023-04-22: 2.5 mg via ORAL
  Filled 2023-04-18: qty 1

## 2023-04-18 NOTE — Plan of Care (Signed)
   Problem: Education: Goal: Ability to describe self-care measures that may prevent or decrease complications (Diabetes Survival Skills Education) will improve Outcome: Progressing Goal: Individualized Educational Video(s) Outcome: Progressing

## 2023-04-18 NOTE — Plan of Care (Signed)

## 2023-04-18 NOTE — Progress Notes (Addendum)
 PROGRESS NOTE    Sandra Cuevas  FMW:[id number] DOB: 23-Oct-1928 DOA: 04/14/2023 PCP: Marvine Rush, MD  No chief complaint on file.   Brief Narrative:   Sandra Cuevas is Sandra Cuevas 89 y.o. female with hx of COPD/asthma, HFpEF, hypertension, hyperlipidemia, diabetes, hypothyroidism, remote GYN cancer status post hysterectomy who presented due to worsening shortness of breath and hypoxia at home.  Patient was admitted with acute hypoxemic respiratory failure secondary to multifocal pneumonia.   Assessment & Plan:   Principal Problem:   Acute hypoxic respiratory failure (HCC)  Multifocal pneumonia, suspect viral Acute hypoxic respiratory failure, requiring 3-4 L O2 - CT chest with evidence of multifocal pneumonia, small bilateral effusions - Continue ceftriaxone , azithromycin  - Negative covid/flu, sputum culture - wean O2 as tolerated -> she notes continued SOB, worse with lying flat - despite normal BNP/alkalosis, will trial dose of lasix  and follow echo  HFpEF, without acute exacerbation:  Clinically, not overloaded, but wonder if volume overload could be contributing to SOB above She describes what sounds like orthopnea.  No significant edema on exam. Trial IV lasix  Repeat echo  Dysphagia - will request SLP eval -> regular, thin  Troponin elevation Likely demand ischemia with flat trend - Treatment directed at pneumonia, hypoxia per above   Incidental findings:  Lung nodule left lower lobe up to the 5 mm: Recommended for repeat CT in 3 to 6 months (if within goals of care) Mediastinal and hilar adenopathy which is stable. 17 mm nodular area in inferior right breast - needs follow up with mammogram outpatient  Chronic medical problems: COPD/asthma: Albuterol  as needed, continue home Singulair   Hypertension: Continue home amlodipine  Hyperlipidemia: Continue home atorvastatin  Diabetes: SSI while inpatient Neuropathy: Continue her home gabapentin  Hypothyroidism: Continue her home  levothyroxine  History of remote gyn cancer with hysterectomy: Outpatient surveillance.   Insomnia Per discussion with daughter, takes 2.5 ambien  nightly  Body mass index is 31.88 kg/m. Obesity class I affecting medical care       DVT prophylaxis: lovenox  Code Status: DNR Family Communication: none Disposition:   Status is: Inpatient Remains inpatient appropriate because: need for continued inpatient care   Consultants:  none  Procedures:  none  Antimicrobials:  Anti-infectives (From admission, onward)    Start     Dose/Rate Route Frequency Ordered Stop   04/15/23 2300  cefTRIAXone  (ROCEPHIN ) 1 g in sodium chloride  0.9 % 100 mL IVPB        1 g 200 mL/hr over 30 Minutes Intravenous Every 24 hours 04/14/23 2217 04/19/23 2259   04/15/23 2200  azithromycin  (ZITHROMAX ) tablet 500 mg        500 mg Oral Daily at bedtime 04/14/23 2217 04/16/23 2112   04/14/23 2230  azithromycin  (ZITHROMAX ) 500 mg in sodium chloride  0.9 % 250 mL IVPB        500 mg 250 mL/hr over 60 Minutes Intravenous  Once 04/14/23 2217 04/15/23 0130   04/14/23 2145  cefTRIAXone  (ROCEPHIN ) 2 g in sodium chloride  0.9 % 100 mL IVPB        2 g 200 mL/hr over 30 Minutes Intravenous  Once 04/14/23 2135 04/14/23 2349   04/14/23 2145  azithromycin  (ZITHROMAX ) 500 mg in sodium chloride  0.9 % 250 mL IVPB  Status:  Discontinued        500 mg 250 mL/hr over 60 Minutes Intravenous Every 24 hours 04/14/23 2135 04/14/23 2217       Subjective: Cuevas/o SOB worse when lying flat Family at bedside  Objective: Vitals:  04/17/23 2114 04/18/23 0416 04/18/23 0535 04/18/23 1451  BP: (!) 170/43 (!) 153/46  (!) 150/60  Pulse: 65 78  65  Resp: 18 20  20   Temp: 99.8 F (37.7 Cuevas) 98.3 F (36.8 Cuevas)    TempSrc:      SpO2: 94% 95% 94% 95%  Weight:   91.3 kg   Height:        Intake/Output Summary (Last 24 hours) at 04/18/2023 1712 Last data filed at 04/18/2023 1330 Gross per 24 hour  Intake 600 ml  Output 1600 ml  Net -1000 ml    Filed Weights   04/16/23 0500 04/17/23 0300 04/18/23 0535  Weight: 90.5 kg 87.4 kg 91.3 kg    Examination:  General: No acute distress. Cardiovascular: RRR Lungs: diminished, unlabored Neurological: Alert and oriented 3. Moves all extremities 4 with equal strength. Cranial nerves II through XII grossly intact. Skin: Warm and dry. No rashes or lesions. Extremities: No clubbing or cyanosis. No edema.  Data Reviewed: I have personally reviewed following labs and imaging studies  CBC: Recent Labs  Lab 04/14/23 1833 04/15/23 0414 04/16/23 0422 04/18/23 0404  WBC 8.3 9.1 8.5 7.8  NEUTROABS 5.6  --   --  4.7  HGB 10.2* 10.2* 9.6* 9.2*  HCT 33.4* 33.7* 31.5* 30.2*  MCV 105.0* 104.0* 103.6* 103.8*  PLT 146* 153 160 156    Basic Metabolic Panel: Recent Labs  Lab 04/14/23 1833 04/15/23 0414 04/16/23 0422 04/18/23 0404  NA 140 138 139 136  K 3.7 3.9 4.1 4.4  CL 103 92* 94* 95*  CO2 30 38* 37* 36*  GLUCOSE 141* 132* 129* 119*  BUN 18 14 21  28*  CREATININE 0.73 0.79 0.97 1.00  CALCIUM  8.4* 9.4 9.1 8.6*  MG  --  1.5* 1.8 1.9  PHOS  --  4.4  --  3.4    GFR: Estimated Creatinine Clearance: 40.9 mL/min (by Cuevas-G formula based on SCr of 1 mg/dL).  Liver Function Tests: Recent Labs  Lab 04/14/23 1833 04/18/23 0404  AST 23 25  ALT 21 22  ALKPHOS 67 55  BILITOT 0.2 0.5  PROT 5.7* 5.7*  ALBUMIN 2.9* 2.6*    CBG: Recent Labs  Lab 04/17/23 1651 04/17/23 2112 04/18/23 0844 04/18/23 1204 04/18/23 1614  GLUCAP 117* 173* 123* 156* 178*     Recent Results (from the past 240 hours)  Respiratory (~20 pathogens) panel by PCR     Status: None   Collection Time: 04/14/23 10:30 PM   Specimen: Nasopharyngeal Swab; Respiratory  Result Value Ref Range Status   Adenovirus NOT DETECTED NOT DETECTED Final   Coronavirus 229E NOT DETECTED NOT DETECTED Final    Comment: (NOTE) The Coronavirus on the Respiratory Panel, DOES NOT test for the novel  Coronavirus (2019  nCoV)    Coronavirus HKU1 NOT DETECTED NOT DETECTED Final   Coronavirus NL63 NOT DETECTED NOT DETECTED Final   Coronavirus OC43 NOT DETECTED NOT DETECTED Final   Metapneumovirus NOT DETECTED NOT DETECTED Final   Rhinovirus / Enterovirus NOT DETECTED NOT DETECTED Final   Influenza Sandra Canizalez NOT DETECTED NOT DETECTED Final   Influenza B NOT DETECTED NOT DETECTED Final   Parainfluenza Virus 1 NOT DETECTED NOT DETECTED Final   Parainfluenza Virus 2 NOT DETECTED NOT DETECTED Final   Parainfluenza Virus 3 NOT DETECTED NOT DETECTED Final   Parainfluenza Virus 4 NOT DETECTED NOT DETECTED Final   Respiratory Syncytial Virus NOT DETECTED NOT DETECTED Final   Bordetella pertussis NOT DETECTED NOT  DETECTED Final   Bordetella Parapertussis NOT DETECTED NOT DETECTED Final   Chlamydophila pneumoniae NOT DETECTED NOT DETECTED Final   Mycoplasma pneumoniae NOT DETECTED NOT DETECTED Final    Comment: Performed at [facility], [address] [place], [place] [address]  SARS Coronavirus 2 by RT PCR (hospital order, performed in [facility] hospital lab) *cepheid single result test* Anterior Nasal Swab     Status: None   Collection Time: 04/14/23 10:30 PM   Specimen: Anterior Nasal Swab  Result Value Ref Range Status   SARS Coronavirus 2 by RT PCR NEGATIVE NEGATIVE Final    Comment: (NOTE) SARS-CoV-2 target nucleic acids are NOT DETECTED.  The SARS-CoV-2 RNA is generally detectable in upper and lower respiratory specimens during the acute phase of infection. The lowest concentration of SARS-CoV-2 viral copies this assay can detect is 250 copies / mL. Shamecca Whitebread negative result does not preclude SARS-CoV-2 infection and should not be used as the sole basis for treatment or other patient management decisions.  Khyra Viscuso negative result may occur with improper specimen collection / handling, submission of specimen other than nasopharyngeal swab, presence of viral mutation(s) within the areas targeted by this assay, and  inadequate number of viral copies (<250 copies / mL). Lott Seelbach negative result must be combined with clinical observations, patient history, and epidemiological information.  Fact Sheet for Patients:   roadlaptop.co.za  Fact Sheet for Healthcare Providers: http://kim-miller.com/  This test is not yet approved or  cleared by the [place]  FDA and has been authorized for detection and/or diagnosis of SARS-CoV-2 by FDA under an Emergency Use Authorization (EUA).  This EUA will remain in effect (meaning this test can be used) for the duration of the COVID-19 declaration under Section 564(b)(1) of the Act, [place] section 360bbb-3(b)(1), unless the authorization is terminated or revoked sooner.  Performed at [facility] [address] [place], [place] [address]          Radiology Studies: DG CHEST PORT 1 VIEW Result Date: 04/18/2023 CLINICAL DATA:  Shortness of breath. Chest tightness. Intermittently productive cough. EXAM: PORTABLE CHEST 1 VIEW COMPARISON:  04/14/2023; 05/16/2021 FINDINGS: Unchanged cardiac silhouette and mediastinal contours with atherosclerotic plaque within the thoracic aorta. Pulmonary vasculature is indistinct with cephalization of flow. Small/trace bilateral effusions and associated bibasilar heterogeneous/consolidative opacities. No pneumothorax. No acute osseous abnormalities. Post right hemi humeral arthroplasty. Moderate to severe degenerative change of the left glenohumeral joint, incompletely evaluated. IMPRESSION: Similar findings of pulmonary edema with small/trace bilateral effusions and associated bibasilar opacities, atelectasis versus infiltrate, right-greater-than-left. Electronically Signed   By: Norleen Roulette M.D.   On: 04/18/2023 14:02         Scheduled Meds:  amLODipine   10 mg Oral Daily   atorvastatin   40 mg Oral QPC supper   enoxaparin  (LOVENOX ) injection  40 mg Subcutaneous Q24H   famotidine   20  mg Oral Daily   fluticasone   2 spray Each Nare Daily   gabapentin   600 mg Oral TID   insulin  aspart  0-6 Units Subcutaneous TID WC   levothyroxine   50 mcg Oral Daily   pantoprazole   40 mg Oral QHS   Continuous Infusions:  cefTRIAXone  (ROCEPHIN )  IV 1 g (04/17/23 2300)     LOS: 4 days    Time spent: over 30 min    Meliton Monte, MD [facility]   To contact the attending provider between 7A-7P or the covering provider during after hours 7P-7A, please log into the web site [email or web address]  and access using universal [place] password for that web site. If you do not have the password, please call the hospital operator.  04/18/2023, 5:12 PM

## 2023-04-18 NOTE — Care Management Important Message (Signed)
 Important Message  Patient Details  Name: Sandra Cuevas MRN: [id number] Date of Birth: 06-10-1928   Important Message Given:  Yes - Medicare IM     Corey Harold 04/18/2023, 11:24 AM

## 2023-04-19 ENCOUNTER — Other Ambulatory Visit (HOSPITAL_COMMUNITY): Payer: Self-pay | Admitting: *Deleted

## 2023-04-19 ENCOUNTER — Inpatient Hospital Stay (HOSPITAL_COMMUNITY): Payer: PPO

## 2023-04-19 DIAGNOSIS — J9601 Acute respiratory failure with hypoxia: Secondary | ICD-10-CM | POA: Diagnosis not present

## 2023-04-19 DIAGNOSIS — R008 Other abnormalities of heart beat: Secondary | ICD-10-CM

## 2023-04-19 LAB — COMPREHENSIVE METABOLIC PANEL
ALT: 23 U/L (ref 0–44)
AST: 24 U/L (ref 15–41)
Albumin: 2.6 g/dL — ABNORMAL LOW (ref 3.5–5.0)
Alkaline Phosphatase: 53 U/L (ref 38–126)
Anion gap: 5 (ref 5–15)
BUN: 27 mg/dL — ABNORMAL HIGH (ref 8–23)
CO2: 39 mmol/L — ABNORMAL HIGH (ref 22–32)
Calcium: 8.8 mg/dL — ABNORMAL LOW (ref 8.9–10.3)
Chloride: 94 mmol/L — ABNORMAL LOW (ref 98–111)
Creatinine, Ser: 0.92 mg/dL (ref 0.44–1.00)
GFR, Estimated: 58 mL/min — ABNORMAL LOW (ref >60)
Glucose, Bld: 126 mg/dL — ABNORMAL HIGH (ref 70–99)
Potassium: 4.2 mmol/L (ref 3.5–5.1)
Sodium: 138 mmol/L (ref 135–145)
Total Bilirubin: 0.5 mg/dL (ref 0.0–1.2)
Total Protein: 5.8 g/dL — ABNORMAL LOW (ref 6.5–8.1)

## 2023-04-19 LAB — CBC WITH DIFFERENTIAL/PLATELET
Abs Immature Granulocytes: 0.02 10*3/uL (ref 0.00–0.07)
Basophils Absolute: 0 10*3/uL (ref 0.0–0.1)
Basophils Relative: 0 %
Eosinophils Absolute: 0.3 10*3/uL (ref 0.0–0.5)
Eosinophils Relative: 4 %
HCT: 30.4 % — ABNORMAL LOW (ref 36.0–46.0)
Hemoglobin: 9.5 g/dL — ABNORMAL LOW (ref 12.0–15.0)
Immature Granulocytes: 0 %
Lymphocytes Relative: 21 %
Lymphs Abs: 1.4 10*3/uL (ref 0.7–4.0)
MCH: 32.3 pg (ref 26.0–34.0)
MCHC: 31.3 g/dL (ref 30.0–36.0)
MCV: 103.4 fL — ABNORMAL HIGH (ref 80.0–100.0)
Monocytes Absolute: 0.9 10*3/uL (ref 0.1–1.0)
Monocytes Relative: 14 %
Neutro Abs: 4.2 10*3/uL (ref 1.7–7.7)
Neutrophils Relative %: 61 %
Platelets: 168 10*3/uL (ref 150–400)
RBC: 2.94 MIL/uL — ABNORMAL LOW (ref 3.87–5.11)
RDW: 14 % (ref 11.5–15.5)
WBC: 6.9 10*3/uL (ref 4.0–10.5)
nRBC: 0 % (ref 0.0–0.2)

## 2023-04-19 LAB — GLUCOSE, CAPILLARY
Glucose-Capillary: 129 mg/dL — ABNORMAL HIGH (ref 70–99)
Glucose-Capillary: 149 mg/dL — ABNORMAL HIGH (ref 70–99)
Glucose-Capillary: 260 mg/dL — ABNORMAL HIGH (ref 70–99)
Glucose-Capillary: 135 mg/dL — ABNORMAL HIGH (ref 70–99)

## 2023-04-19 LAB — ECHOCARDIOGRAM COMPLETE
AR max vel: 1.54 cm2
AV Area VTI: 1.56 cm2
AV Area mean vel: 1.58 cm2
AV Mean grad: 11 mm[Hg]
AV Peak grad: 19.9 mm[Hg]
Ao pk vel: 2.23 m/s
Area-P 1/2: 2.07 cm2
Height: 66 in
MV VTI: 1.39 cm2
S' Lateral: 3.2 cm
Weight: 3199.32 [oz_av]

## 2023-04-19 LAB — MAGNESIUM: Magnesium: 1.8 mg/dL (ref 1.7–2.4)

## 2023-04-19 LAB — PHOSPHORUS: Phosphorus: 2.8 mg/dL (ref 2.5–4.6)

## 2023-04-19 MED ORDER — SODIUM CHLORIDE 0.9 % IV SOLN
1.0000 g | INTRAVENOUS | Status: AC
  Administered 2023-04-19 – 2023-04-20 (×2): 1 g via INTRAVENOUS
  Filled 2023-04-19 (×2): qty 10

## 2023-04-19 NOTE — Progress Notes (Signed)
 SATURATION QUALIFICATIONS: (This note is used to comply with regulatory documentation for home oxygen )  Patient Saturations on 2 liters of oxygen  at Rest = 96%  Patient Saturations on 2 Liters of oxygen  while Ambulating = 85% (ambulated 120 ft)  Please briefly explain why patient needs home oxygen : Pt with significant desaturation & dyspnea with exertion.

## 2023-04-19 NOTE — Plan of Care (Signed)
  Problem: Education: Goal: Ability to describe self-care measures that may prevent or decrease complications (Diabetes Survival Skills Education) will improve Outcome: Progressing Goal: Individualized Educational Video(s) Outcome: Progressing   Problem: Coping: Goal: Ability to adjust to condition or change in health will improve Outcome: Progressing   Problem: Fluid Volume: Goal: Ability to maintain a balanced intake and output will improve Outcome: Progressing   Problem: Health Behavior/Discharge Planning: Goal: Ability to identify and utilize available resources and services will improve Outcome: Progressing Goal: Ability to manage health-related needs will improve Outcome: Progressing   Problem: Metabolic: Goal: Ability to maintain appropriate glucose levels will improve Outcome: Progressing  -  only received insulin  for glucose correction once this shift   Problem: Nutritional: Goal: Maintenance of adequate nutrition will improve Outcome: Progressing Goal: Progress toward achieving an optimal weight will improve Outcome: Progressing   Problem: Skin Integrity: Goal: Risk for impaired skin integrity will decrease Outcome: Progressing   Problem: Tissue Perfusion: Goal: Adequacy of tissue perfusion will improve Outcome: Progressing   Problem: Education: Goal: Knowledge of General Education information will improve Description: Including pain rating scale, medication(s)/side effects and non-pharmacologic comfort measures Outcome: Progressing   Problem: Health Behavior/Discharge Planning: Goal: Ability to manage health-related needs will improve Outcome: Progressing   Problem: Clinical Measurements: Goal: Ability to maintain clinical measurements within normal limits will improve Outcome: Progressing Goal: Will remain free from infection Outcome: Progressing Goal: Diagnostic test results will improve Outcome: Progressing Goal: Respiratory complications will  improve Outcome: Progressing  -  Pt remains on 2 lpm [place], unable to wean to lower flow rate due to significant desaturation off O2, especially with exertion. Goal: Cardiovascular complication will be avoided Outcome: Progressing   Problem: Activity: Goal: Risk for activity intolerance will decrease Outcome: Progressing  -   Up in recliner most of the day today, ambulated in hallway with PT staff, tolerated well   Problem: Nutrition: Goal: Adequate nutrition will be maintained Outcome: Progressing  -  Tolerating food/fluids well with no n/v   Problem: Coping: Goal: Level of anxiety will decrease Outcome: Progressing  -  No complaints of anxiety this shift   Problem: Elimination: Goal: Will not experience complications related to bowel motility Outcome: Progressing Goal: Will not experience complications related to urinary retention Outcome: Progressing  -  pt voiding without difficulty. Did not use purewick this shift.   Problem: Pain Management: Goal: General experience of comfort will improve Outcome: Progressing  -  No complaints of pain   Problem: Safety: Goal: Ability to remain free from injury will improve Outcome: Progressing  -  No injury noted   Problem: Skin Integrity: Goal: Risk for impaired skin integrity will decrease Outcome: Progressing  -  No impaired skin integrity

## 2023-04-19 NOTE — Plan of Care (Signed)

## 2023-04-19 NOTE — Progress Notes (Signed)
*  PRELIMINARY RESULTS* Echocardiogram 2D Echocardiogram has been performed.  Stacey Drain 04/19/2023, 1:52 PM

## 2023-04-19 NOTE — Evaluation (Signed)
 Physical Therapy Evaluation Patient Details Name: Sandra Cuevas MRN: [id number] DOB: March 08, 1929 Today's Date: 04/19/2023  History of Present Illness  Per MD:  Sandra Cuevas is a 89 y.o. female with hx of COPD/asthma, HFpEF, hypertension, hyperlipidemia, diabetes, hypothyroidism, remote GYN cancer status post hysterectomy who presented due to worsening shortness of breath and hypoxia at home.  Reports she has been more fatigued over the past few weeks.  Over the past few days worsening dyspnea on exertion.  She had new onset of cough productive with gray chunks of sputum.  And she checked her home O2 which was in the 70s at the lowest prompting her to come into the hospital.  She does not wear oxygen  at home.  Clinical Impression  Pt agreeable for therapy.  Able to come sit to stand with mod I.  Ambulated with 2L O2 and RW x 130 ft.  O2 taken immediately after 84; had pt take deep breaths for 30 seconds 94.  Pt disappointed, however, therapist explained that even if she has to go home with O2 it does not mean that she will be on O2 forever.          If plan is discharge home, recommend the following: Assistance with cooking/housework;Assist for transportation;Help with stairs or ramp for entrance   Can travel by private vehicle    yes    Equipment Recommendations None recommended by PT  Recommendations for Other Services    None pt limitations are more respiratory in nature.    Functional Status Assessment Patient has had a recent decline in their functional status and demonstrates the ability to make significant improvements in function in a reasonable and predictable amount of time.     Precautions / Restrictions Precautions Precautions: None Restrictions Weight Bearing Restrictions Per Provider Order: No      Mobility  Bed Mobility Overal bed mobility: Modified Independent                  Transfers Overall transfer level: Modified independent Equipment used: Rolling  walker (2 wheels)                    Ambulation/Gait Ambulation/Gait assistance: Modified independent (Device/Increase time) Gait Distance (Feet): 130 Feet Assistive device: Rolling walker (2 wheels) (02) Gait Pattern/deviations: Decreased step length - right, Decreased step length - left Gait velocity: slower but pt states normal for her as she has OA             Pertinent Vitals/Pain Pain Assessment Pain Assessment: 0-10 Pain Score: 3  Pain Location: knees normal for pt Pain Descriptors / Indicators: Aching    Home Living Family/patient expects to be discharged to:: Private residence Living Arrangements: Alone Available Help at Discharge: Family;Friend(s);Available PRN/intermittently Type of Home: House Home Access: Level entry       Home Layout: One level Home Equipment: Agricultural Consultant (2 wheels);BSC/3in1      Prior Function  Mod I                      Extremity/Trunk Assessment        Lower Extremity Assessment Lower Extremity Assessment: Overall WFL for tasks assessed       Communication   Communication Communication: No apparent difficulties Cueing Techniques: Verbal cues  Cognition Arousal: Alert Behavior During Therapy: WFL for tasks assessed/performed Overall Cognitive Status: Within Functional Limits for tasks assessed  Assessment/Plan    PT Assessment Patient needs continued PT services  PT Problem List Decreased activity tolerance       PT Treatment Interventions Therapeutic activities;Therapeutic exercise;Gait training    PT Goals (Current goals can be found in the Care Plan section)  Acute Rehab PT Goals Patient Stated Goal: to go home PT Goal Formulation: With patient Time For Goal Achievement: 04/21/23 Potential to Achieve Goals: Good    Frequency Min 2X/week        AM-PAC PT 6 Clicks Mobility  Outcome Measure Help needed turning from  your back to your side while in a flat bed without using bedrails?: None Help needed moving from lying on your back to sitting on the side of a flat bed without using bedrails?: A Little Help needed moving to and from a bed to a chair (including a wheelchair)?: A Little Help needed standing up from a chair using your arms (e.g., wheelchair or bedside chair)?: A Little Help needed to walk in hospital room?: A Little Help needed climbing 3-5 steps with a railing? : A Little 6 Click Score: 19    End of Session Equipment Utilized During Treatment: Gait belt;Oxygen  (2L) Activity Tolerance: Patient tolerated treatment well Patient left: in bed (Getting US  of her heart.) Nurse Communication: Mobility status PT Visit Diagnosis: Muscle weakness (generalized) (M62.81)    Time: 1200-1230 PT Time Calculation (min) (ACUTE ONLY): 30 min   Charges:   PT Evaluation $PT Eval Low Complexity: 1 Low   PT General Charges $$ ACUTE PT VISIT: 1 Visit         Montie Metro, PT CLT [phone]   04/19/2023, 1:17 PM

## 2023-04-19 NOTE — Progress Notes (Signed)
 PROGRESS NOTE    Sandra Cuevas  FMW:[id number] DOB: 05-Dec-1928 DOA: 04/14/2023 PCP: Marvine Rush, MD  No chief complaint on file.   Brief Narrative:   Sandra Cuevas is Sandra Cuevas 89 y.o. female with hx of COPD/asthma, HFpEF, hypertension, hyperlipidemia, diabetes, hypothyroidism, remote GYN cancer status post hysterectomy who presented due to worsening shortness of breath and hypoxia at home.  Patient was admitted with acute hypoxemic respiratory failure secondary to multifocal pneumonia.   Assessment & Plan:   Principal Problem:   Acute hypoxic respiratory failure (HCC)  Multifocal pneumonia, suspect viral Acute hypoxic respiratory failure, requiring 3-4 L O2 - CT chest with evidence of multifocal pneumonia, small bilateral effusions - Continue ceftriaxone  (plan for 7 days), azithromycin  - Negative covid/flu, sputum culture - follow echo - wean o2 as tolerated  HFpEF, without acute exacerbation:  Clinically, not overloaded, but wonder if volume overload could be contributing to SOB above She describes what sounds like orthopnea.  No significant edema on exam. S/p IV lasix  1/10.  Worsening alkalosis today, will hold additional diuresis.  Repeat echo  Metabolic Alkalosis  Respiratory Acidosis  - some degree of chronicity - compensation for chronic respiratory acidosis  Dysphagia - will request SLP eval -> regular, thin  Troponin elevation Likely demand ischemia with flat trend - Treatment directed at pneumonia, hypoxia per above   Incidental findings:  Lung nodule left lower lobe up to the 5 mm: Recommended for repeat CT in 3 to 6 months (if within goals of care) Mediastinal and hilar adenopathy which is stable. 17 mm nodular area in inferior right breast - needs follow up with mammogram outpatient  Chronic medical problems: COPD/asthma: Albuterol  as needed, continue home Singulair   Hypertension: Continue home amlodipine  Hyperlipidemia: Continue home atorvastatin  Diabetes:  SSI while inpatient Neuropathy: Continue her home gabapentin  Hypothyroidism: Continue her home levothyroxine  History of remote gyn cancer with hysterectomy: Outpatient surveillance.   Insomnia Per discussion with daughter, takes 2.5 ambien  nightly  Body mass index is 31.88 kg/m. Obesity class I affecting medical care       DVT prophylaxis: lovenox  Code Status: DNR Family Communication: none Disposition:   Status is: Inpatient Remains inpatient appropriate because: need for continued inpatient care   Consultants:  none  Procedures:  none  Antimicrobials:  Anti-infectives (From admission, onward)    Start     Dose/Rate Route Frequency Ordered Stop   04/15/23 2300  cefTRIAXone  (ROCEPHIN ) 1 g in sodium chloride  0.9 % 100 mL IVPB        1 g 200 mL/hr over 30 Minutes Intravenous Every 24 hours 04/14/23 2217 04/18/23 2330   04/15/23 2200  azithromycin  (ZITHROMAX ) tablet 500 mg        500 mg Oral Daily at bedtime 04/14/23 2217 04/16/23 2112   04/14/23 2230  azithromycin  (ZITHROMAX ) 500 mg in sodium chloride  0.9 % 250 mL IVPB        500 mg 250 mL/hr over 60 Minutes Intravenous  Once 04/14/23 2217 04/15/23 0130   04/14/23 2145  cefTRIAXone  (ROCEPHIN ) 2 g in sodium chloride  0.9 % 100 mL IVPB        2 g 200 mL/hr over 30 Minutes Intravenous  Once 04/14/23 2135 04/14/23 2349   04/14/23 2145  azithromycin  (ZITHROMAX ) 500 mg in sodium chloride  0.9 % 250 mL IVPB  Status:  Discontinued        500 mg 250 mL/hr over 60 Minutes Intravenous Every 24 hours 04/14/23 2135 04/14/23 2217  Subjective: Feels Sandra Cuevas little better today Slept better  Objective: Vitals:   04/18/23 2050 04/19/23 0016 04/19/23 0452 04/19/23 0500  BP: (!) 141/55 (!) 160/55 (!) 165/63   Pulse: 77 80 81   Resp: 20 18    Temp: 100 F (37.8 C) 99.8 F (37.7 C) 97.7 F (36.5 C)   TempSrc: Oral Oral Oral   SpO2: 95% 93% 94%   Weight:    90.7 kg  Height:        Intake/Output Summary (Last 24 hours) at  04/19/2023 0921 Last data filed at 04/19/2023 0900 Gross per 24 hour  Intake 960 ml  Output 1300 ml  Net -340 ml   Filed Weights   04/17/23 0300 04/18/23 0535 04/19/23 0500  Weight: 87.4 kg 91.3 kg 90.7 kg    Examination:  General: No acute distress. Cardiovascular: Heart sounds show Sandra Cuevas regular rate, and rhythm.  Lungs: Clear to auscultation bilaterally Abdomen: Soft, nontender, nondistended Neurological: Alert and oriented 3. Moves all extremities 4 with equal strength. Cranial nerves II through XII grossly intact. Extremities: no LEE   Data Reviewed: I have personally reviewed following labs and imaging studies  CBC: Recent Labs  Lab 04/14/23 1833 04/15/23 0414 04/16/23 0422 04/18/23 0404 04/19/23 0757  WBC 8.3 9.1 8.5 7.8 6.9  NEUTROABS 5.6  --   --  4.7 4.2  HGB 10.2* 10.2* 9.6* 9.2* 9.5*  HCT 33.4* 33.7* 31.5* 30.2* 30.4*  MCV 105.0* 104.0* 103.6* 103.8* 103.4*  PLT 146* 153 160 156 168    Basic Metabolic Panel: Recent Labs  Lab 04/14/23 1833 04/15/23 0414 04/16/23 0422 04/18/23 0404 04/19/23 0757  NA 140 138 139 136 138  K 3.7 3.9 4.1 4.4 4.2  CL 103 92* 94* 95* 94*  CO2 30 38* 37* 36* 39*  GLUCOSE 141* 132* 129* 119* 126*  BUN 18 14 21  28* 27*  CREATININE 0.73 0.79 0.97 1.00 0.92  CALCIUM  8.4* 9.4 9.1 8.6* 8.8*  MG  --  1.5* 1.8 1.9 1.8  PHOS  --  4.4  --  3.4 2.8    GFR: Estimated Creatinine Clearance: 44.3 mL/min (by C-G formula based on SCr of 0.92 mg/dL).  Liver Function Tests: Recent Labs  Lab 04/14/23 1833 04/18/23 0404 04/19/23 0757  AST 23 25 24   ALT 21 22 23   ALKPHOS 67 55 53  BILITOT 0.2 0.5 0.5  PROT 5.7* 5.7* 5.8*  ALBUMIN 2.9* 2.6* 2.6*    CBG: Recent Labs  Lab 04/18/23 0844 04/18/23 1204 04/18/23 1614 04/18/23 2052 04/19/23 0657  GLUCAP 123* 156* 178* 145* 129*     Recent Results (from the past 240 hours)  Respiratory (~20 pathogens) panel by PCR     Status: None   Collection Time: 04/14/23 10:30 PM    Specimen: Nasopharyngeal Swab; Respiratory  Result Value Ref Range Status   Adenovirus NOT DETECTED NOT DETECTED Final   Coronavirus 229E NOT DETECTED NOT DETECTED Final    Comment: (NOTE) The Coronavirus on the Respiratory Panel, DOES NOT test for the novel  Coronavirus (2019 nCoV)    Coronavirus HKU1 NOT DETECTED NOT DETECTED Final   Coronavirus NL63 NOT DETECTED NOT DETECTED Final   Coronavirus OC43 NOT DETECTED NOT DETECTED Final   Metapneumovirus NOT DETECTED NOT DETECTED Final   Rhinovirus / Enterovirus NOT DETECTED NOT DETECTED Final   Influenza Ahyan Kreeger NOT DETECTED NOT DETECTED Final   Influenza B NOT DETECTED NOT DETECTED Final   Parainfluenza Virus 1 NOT DETECTED NOT DETECTED Final  Parainfluenza Virus 2 NOT DETECTED NOT DETECTED Final   Parainfluenza Virus 3 NOT DETECTED NOT DETECTED Final   Parainfluenza Virus 4 NOT DETECTED NOT DETECTED Final   Respiratory Syncytial Virus NOT DETECTED NOT DETECTED Final   Bordetella pertussis NOT DETECTED NOT DETECTED Final   Bordetella Parapertussis NOT DETECTED NOT DETECTED Final   Chlamydophila pneumoniae NOT DETECTED NOT DETECTED Final   Mycoplasma pneumoniae NOT DETECTED NOT DETECTED Final    Comment: Performed at [facility], [address], [place], [place] [address]  SARS Coronavirus 2 by RT PCR (hospital order, performed in [facility] hospital lab) *cepheid single result test* Anterior Nasal Swab     Status: None   Collection Time: 04/14/23 10:30 PM   Specimen: Anterior Nasal Swab  Result Value Ref Range Status   SARS Coronavirus 2 by RT PCR NEGATIVE NEGATIVE Final    Comment: (NOTE) SARS-CoV-2 target nucleic acids are NOT DETECTED.  The SARS-CoV-2 RNA is generally detectable in upper and lower respiratory specimens during the acute phase of infection. The lowest concentration of SARS-CoV-2 viral copies this assay can detect is 250 copies / mL. Edward Guthmiller negative result does not preclude SARS-CoV-2 infection and should not be  used as the sole basis for treatment or other patient management decisions.  Taiten Brawn negative result may occur with improper specimen collection / handling, submission of specimen other than nasopharyngeal swab, presence of viral mutation(s) within the areas targeted by this assay, and inadequate number of viral copies (<250 copies / mL). Lariyah Shetterly negative result must be combined with clinical observations, patient history, and epidemiological information.  Fact Sheet for Patients:   roadlaptop.co.za  Fact Sheet for Healthcare Providers: http://kim-miller.com/  This test is not yet approved or  cleared by the [place]  FDA and has been authorized for detection and/or diagnosis of SARS-CoV-2 by FDA under an Emergency Use Authorization (EUA).  This EUA will remain in effect (meaning this test can be used) for the duration of the COVID-19 declaration under Section 564(b)(1) of the Act, 21 U.S.C. section 360bbb-3(b)(1), unless the authorization is terminated or revoked sooner.  Performed at [facility], [address], [place], [place] [address]          Radiology Studies: DG CHEST PORT 1 VIEW Result Date: 04/18/2023 CLINICAL DATA:  Shortness of breath. Chest tightness. Intermittently productive cough. EXAM: PORTABLE CHEST 1 VIEW COMPARISON:  04/14/2023; 05/16/2021 FINDINGS: Unchanged cardiac silhouette and mediastinal contours with atherosclerotic plaque within the thoracic aorta. Pulmonary vasculature is indistinct with cephalization of flow. Small/trace bilateral effusions and associated bibasilar heterogeneous/consolidative opacities. No pneumothorax. No acute osseous abnormalities. Post right hemi humeral arthroplasty. Moderate to severe degenerative change of the left glenohumeral joint, incompletely evaluated. IMPRESSION: Similar findings of pulmonary edema with small/trace bilateral effusions and associated bibasilar opacities, atelectasis versus  infiltrate, right-greater-than-left. Electronically Signed   By: Norleen Roulette M.D.   On: 04/18/2023 14:02         Scheduled Meds:  amLODipine   10 mg Oral Daily   atorvastatin   40 mg Oral QPC supper   enoxaparin  (LOVENOX ) injection  40 mg Subcutaneous Q24H   famotidine   20 mg Oral Daily   fluticasone   2 spray Each Nare Daily   gabapentin   600 mg Oral TID   insulin  aspart  0-6 Units Subcutaneous TID WC   levothyroxine   50 mcg Oral Daily   pantoprazole   40 mg Oral QHS   Continuous Infusions:     LOS: 5 days    Time  spent: over 30 min    Meliton Monte, MD [facility]   To contact the attending provider between 7A-7P or the covering provider during after hours 7P-7A, please log into the web site [email or web address] and access using universal [place] password for that web site. If you do not have the password, please call the hospital operator.  04/19/2023, 9:21 AM

## 2023-04-20 DIAGNOSIS — J9601 Acute respiratory failure with hypoxia: Secondary | ICD-10-CM | POA: Diagnosis not present

## 2023-04-20 LAB — CBC WITH DIFFERENTIAL/PLATELET
Abs Immature Granulocytes: 0.01 10*3/uL (ref 0.00–0.07)
Basophils Absolute: 0 10*3/uL (ref 0.0–0.1)
Basophils Relative: 1 %
Eosinophils Absolute: 0.4 10*3/uL (ref 0.0–0.5)
Eosinophils Relative: 6 %
HCT: 29.8 % — ABNORMAL LOW (ref 36.0–46.0)
Hemoglobin: 9.3 g/dL — ABNORMAL LOW (ref 12.0–15.0)
Immature Granulocytes: 0 %
Lymphocytes Relative: 22 %
Lymphs Abs: 1.4 10*3/uL (ref 0.7–4.0)
MCH: 32.5 pg (ref 26.0–34.0)
MCHC: 31.2 g/dL (ref 30.0–36.0)
MCV: 104.2 fL — ABNORMAL HIGH (ref 80.0–100.0)
Monocytes Absolute: 0.9 10*3/uL (ref 0.1–1.0)
Monocytes Relative: 15 %
Neutro Abs: 3.5 10*3/uL (ref 1.7–7.7)
Neutrophils Relative %: 56 %
Platelets: 165 10*3/uL (ref 150–400)
RBC: 2.86 MIL/uL — ABNORMAL LOW (ref 3.87–5.11)
RDW: 13.6 % (ref 11.5–15.5)
WBC: 6.2 10*3/uL (ref 4.0–10.5)
nRBC: 0 % (ref 0.0–0.2)

## 2023-04-20 LAB — COMPREHENSIVE METABOLIC PANEL
ALT: 24 U/L (ref 0–44)
AST: 25 U/L (ref 15–41)
Albumin: 2.5 g/dL — ABNORMAL LOW (ref 3.5–5.0)
Alkaline Phosphatase: 54 U/L (ref 38–126)
Anion gap: 6 (ref 5–15)
BUN: 31 mg/dL — ABNORMAL HIGH (ref 8–23)
CO2: 38 mmol/L — ABNORMAL HIGH (ref 22–32)
Calcium: 8.7 mg/dL — ABNORMAL LOW (ref 8.9–10.3)
Chloride: 94 mmol/L — ABNORMAL LOW (ref 98–111)
Creatinine, Ser: 0.98 mg/dL (ref 0.44–1.00)
GFR, Estimated: 54 mL/min — ABNORMAL LOW (ref >60)
Glucose, Bld: 126 mg/dL — ABNORMAL HIGH (ref 70–99)
Potassium: 4.3 mmol/L (ref 3.5–5.1)
Sodium: 138 mmol/L (ref 135–145)
Total Bilirubin: 0.4 mg/dL (ref 0.0–1.2)
Total Protein: 5.6 g/dL — ABNORMAL LOW (ref 6.5–8.1)

## 2023-04-20 LAB — GLUCOSE, CAPILLARY
Glucose-Capillary: 122 mg/dL — ABNORMAL HIGH (ref 70–99)
Glucose-Capillary: 134 mg/dL — ABNORMAL HIGH (ref 70–99)
Glucose-Capillary: 217 mg/dL — ABNORMAL HIGH (ref 70–99)

## 2023-04-20 LAB — PHOSPHORUS: Phosphorus: 3 mg/dL (ref 2.5–4.6)

## 2023-04-20 LAB — MAGNESIUM: Magnesium: 1.9 mg/dL (ref 1.7–2.4)

## 2023-04-20 MED ORDER — IPRATROPIUM-ALBUTEROL 0.5-2.5 (3) MG/3ML IN SOLN
3.0000 mL | Freq: Four times a day (QID) | RESPIRATORY_TRACT | Status: DC
Start: 2023-04-20 — End: 2023-04-21
  Administered 2023-04-20 – 2023-04-21 (×3): 3 mL via RESPIRATORY_TRACT
  Filled 2023-04-20 (×3): qty 3

## 2023-04-20 MED ORDER — GUAIFENESIN ER 600 MG PO TB12
600.0000 mg | ORAL_TABLET | Freq: Two times a day (BID) | ORAL | Status: DC | PRN
  Administered 2023-04-20: 600 mg via ORAL
  Filled 2023-04-20: qty 1

## 2023-04-20 NOTE — Progress Notes (Signed)
 SaO2 94% at rest on 2lpm [place]. Pt ambulated in hallway 200 feet with FWW and standby assist, on 2 lpm [place]. SaO2 down to 89% while ambulating but pt with no c/o SOB and no dyspnea noted. Once back to room and in chair, SaO2 up to 96% on 2lpm [place]. Pt completed IS to 1150 with excellent effort x 10. Continues congested but non-productive cough. Pt states she really feels better since receiving the nebulizer earlier today and hopes that she will have more luck coughing up phlegm as day goes on. O2 decreased to 1 lpm. SaO2 rechecked after 15 minutes, reads 92% on 1 lpm [place], pt with no c/o. Advised to call for needs, call bell within reach.

## 2023-04-20 NOTE — Progress Notes (Addendum)
 PROGRESS NOTE    Sandra Cuevas  FMW:[id number] DOB: [birth year] DOA: 04/14/2023 PCP: Marvine Rush, MD  No chief complaint on file.   Brief Narrative:   Sandra Cuevas is Sandra Cuevas 89 y.o. female with hx of COPD/asthma, HFpEF, hypertension, hyperlipidemia, diabetes, hypothyroidism, remote GYN cancer status post hysterectomy who presented due to worsening shortness of breath and hypoxia at home.  Patient was admitted with acute hypoxemic respiratory failure secondary to multifocal pneumonia.   Assessment & Plan:   Principal Problem:   Acute hypoxic respiratory failure (HCC)  Multifocal pneumonia, suspect viral Acute hypoxic respiratory failure, requiring 2L O2 - CT chest with evidence of multifocal pneumonia, small bilateral effusions - Continue ceftriaxone  (plan for 7 days), azithromycin  - Negative covid/flu, sputum culture - wean o2 as tolerated  HFpEF, without acute exacerbation:  Clinically, not overloaded, but wonder if volume overload could be contributing to SOB above She describes what sounds like orthopnea.  No significant edema on exam. S/p IV lasix  1/10.  Worsening alkalosis today, will hold additional diuresis.  Repeat echo -> EF 60-65%, no rwma.  Grade II diastolic dysfunction.  mild MS. Mild AS.    Metabolic Alkalosis  Respiratory Acidosis  - some degree of chronicity - compensation for chronic respiratory acidosis  Dysphagia - will request SLP eval -> regular, thin  Troponin elevation Likely demand ischemia with flat trend - Treatment directed at pneumonia, hypoxia per above   Incidental findings:  Lung nodule left lower lobe up to the 5 mm: Recommended for repeat CT in 3 to 6 months (if within goals of care) Mediastinal and hilar adenopathy which is stable. 17 mm nodular area in inferior right breast - needs follow up with mammogram outpatient  Chronic medical problems: COPD/asthma: Albuterol  as needed, continue home Singulair   Hypertension: Continue home  amlodipine  Hyperlipidemia: Continue home atorvastatin  Diabetes: SSI while inpatient Neuropathy: Continue her home gabapentin  Hypothyroidism: Continue her home levothyroxine  History of remote gyn cancer with hysterectomy: Outpatient surveillance.   Insomnia Per discussion with daughter, takes 2.5 ambien  nightly  Body mass index is 31.88 kg/m. Obesity class I affecting medical care       DVT prophylaxis: lovenox  Code Status: DNR Family Communication: none Disposition:   Status is: Inpatient Remains inpatient appropriate because: need for continued inpatient care   Consultants:  none  Procedures:  Echo IMPRESSIONS     1. Left ventricular ejection fraction, by estimation, is 60 to 65%. The  left ventricle has normal function. The left ventricle has no regional  wall motion abnormalities. There is mild left ventricular hypertrophy.  Left ventricular diastolic parameters  are consistent with Grade II diastolic dysfunction (pseudonormalization).  Elevated left atrial pressure.   2. Right ventricular systolic function is normal. The right ventricular  size is normal.   3. Left atrial size was severely dilated.   4. Mean gradient through the MV is 5 mm Hg (HR 67 bpm). Overall  consistent with mild MS. Mild mitral valve regurgitation. Severe mitral  annular calcification.   5. AV is difficult to see well Peak and mean gradients through the valve  are 18 and 10 mm Hg respectively AVA (VTI is 1.58 cm2 Dimensionless index  is 0.55 Overall consistent with mild AS No significant change from  previous echo . The aortic valve is  tricuspid. Aortic valve regurgitation is not visualized.   Antimicrobials:  Anti-infectives (From admission, onward)    Start     Dose/Rate Route Frequency Ordered Stop   04/19/23 2200  cefTRIAXone  (ROCEPHIN ) 1 g in sodium chloride  0.9 % 100 mL IVPB        1 g 200 mL/hr over 30 Minutes Intravenous Every 24 hours 04/19/23 0923 04/21/23 2159   04/15/23  2300  cefTRIAXone  (ROCEPHIN ) 1 g in sodium chloride  0.9 % 100 mL IVPB        1 g 200 mL/hr over 30 Minutes Intravenous Every 24 hours 04/14/23 2217 04/19/23 0001   04/15/23 2200  azithromycin  (ZITHROMAX ) tablet 500 mg        500 mg Oral Daily at bedtime 04/14/23 2217 04/16/23 2112   04/14/23 2230  azithromycin  (ZITHROMAX ) 500 mg in sodium chloride  0.9 % 250 mL IVPB        500 mg 250 mL/hr over 60 Minutes Intravenous  Once 04/14/23 2217 04/15/23 0130   04/14/23 2145  cefTRIAXone  (ROCEPHIN ) 2 g in sodium chloride  0.9 % 100 mL IVPB        2 g 200 mL/hr over 30 Minutes Intravenous  Once 04/14/23 2135 04/14/23 2349   04/14/23 2145  azithromycin  (ZITHROMAX ) 500 mg in sodium chloride  0.9 % 250 mL IVPB  Status:  Discontinued        500 mg 250 mL/hr over 60 Minutes Intravenous Every 24 hours 04/14/23 2135 04/14/23 2217       Subjective: No new complaints Slept well, denies orthopnea C/o SOB  Objective: Vitals:   04/19/23 1419 04/19/23 2051 04/20/23 0500 04/20/23 0614  BP: (!) 153/52 (!) 141/37  (!) 138/49  Pulse: 73 (!) 59  78  Resp:  20  20  Temp: 98.1 F (36.7 C) 98.5 F (36.9 C)  99.1 F (37.3 C)  TempSrc: Oral Oral  Oral  SpO2: 91% 97%  95%  Weight:   91.6 kg   Height:        Intake/Output Summary (Last 24 hours) at 04/20/2023 1015 Last data filed at 04/20/2023 0500 Gross per 24 hour  Intake 480 ml  Output 1000 ml  Net -520 ml   Filed Weights   04/18/23 0535 04/19/23 0500 04/20/23 0500  Weight: 91.3 kg 90.7 kg 91.6 kg    Examination:  General: No acute distress. Cardiovascular: RRR Lungs: diminished, scattered rhonchi, rare expiratory wheeze Neurological: Alert and oriented 3. Moves all extremities 4 with equal strength. Cranial nerves II through XII grossly intact. Extremities: No clubbing or cyanosis. No edema.   Data Reviewed: I have personally reviewed following labs and imaging studies  CBC: Recent Labs  Lab 04/14/23 1833 04/15/23 0414 04/16/23 0422  04/18/23 0404 04/19/23 0757 04/20/23 0400  WBC 8.3 9.1 8.5 7.8 6.9 6.2  NEUTROABS 5.6  --   --  4.7 4.2 3.5  HGB 10.2* 10.2* 9.6* 9.2* 9.5* 9.3*  HCT 33.4* 33.7* 31.5* 30.2* 30.4* 29.8*  MCV 105.0* 104.0* 103.6* 103.8* 103.4* 104.2*  PLT 146* 153 160 156 168 165    Basic Metabolic Panel: Recent Labs  Lab 04/15/23 0414 04/16/23 0422 04/18/23 0404 04/19/23 0757 04/20/23 0400  NA 138 139 136 138 138  K 3.9 4.1 4.4 4.2 4.3  CL 92* 94* 95* 94* 94*  CO2 38* 37* 36* 39* 38*  GLUCOSE 132* 129* 119* 126* 126*  BUN 14 21 28* 27* 31*  CREATININE 0.79 0.97 1.00 0.92 0.98  CALCIUM  9.4 9.1 8.6* 8.8* 8.7*  MG 1.5* 1.8 1.9 1.8 1.9  PHOS 4.4  --  3.4 2.8 3.0    GFR: Estimated Creatinine Clearance: 41.7 mL/min (by C-G formula based on SCr of  0.98 mg/dL).  Liver Function Tests: Recent Labs  Lab 04/14/23 1833 04/18/23 0404 04/19/23 0757 04/20/23 0400  AST 23 25 24 25   ALT 21 22 23 24   ALKPHOS 67 55 53 54  BILITOT 0.2 0.5 0.5 0.4  PROT 5.7* 5.7* 5.8* 5.6*  ALBUMIN 2.9* 2.6* 2.6* 2.5*    CBG: Recent Labs  Lab 04/19/23 0657 04/19/23 1106 04/19/23 1629 04/19/23 2206 04/20/23 0716  GLUCAP 129* 149* 260* 135* 122*     Recent Results (from the past 240 hours)  Respiratory (~20 pathogens) panel by PCR     Status: None   Collection Time: 04/14/23 10:30 PM   Specimen: Nasopharyngeal Swab; Respiratory  Result Value Ref Range Status   Adenovirus NOT DETECTED NOT DETECTED Final   Coronavirus 229E NOT DETECTED NOT DETECTED Final    Comment: (NOTE) The Coronavirus on the Respiratory Panel, DOES NOT test for the novel  Coronavirus (2019 nCoV)    Coronavirus HKU1 NOT DETECTED NOT DETECTED Final   Coronavirus NL63 NOT DETECTED NOT DETECTED Final   Coronavirus OC43 NOT DETECTED NOT DETECTED Final   Metapneumovirus NOT DETECTED NOT DETECTED Final   Rhinovirus / Enterovirus NOT DETECTED NOT DETECTED Final   Influenza Giavanni Zeitlin NOT DETECTED NOT DETECTED Final   Influenza B NOT DETECTED  NOT DETECTED Final   Parainfluenza Virus 1 NOT DETECTED NOT DETECTED Final   Parainfluenza Virus 2 NOT DETECTED NOT DETECTED Final   Parainfluenza Virus 3 NOT DETECTED NOT DETECTED Final   Parainfluenza Virus 4 NOT DETECTED NOT DETECTED Final   Respiratory Syncytial Virus NOT DETECTED NOT DETECTED Final   Bordetella pertussis NOT DETECTED NOT DETECTED Final   Bordetella Parapertussis NOT DETECTED NOT DETECTED Final   Chlamydophila pneumoniae NOT DETECTED NOT DETECTED Final   Mycoplasma pneumoniae NOT DETECTED NOT DETECTED Final    Comment: Performed at [facility], [address], [place], [place] [address]  SARS Coronavirus 2 by RT PCR (hospital order, performed in [facility] hospital lab) *cepheid single result test* Anterior Nasal Swab     Status: None   Collection Time: 04/14/23 10:30 PM   Specimen: Anterior Nasal Swab  Result Value Ref Range Status   SARS Coronavirus 2 by RT PCR NEGATIVE NEGATIVE Final    Comment: (NOTE) SARS-CoV-2 target nucleic acids are NOT DETECTED.  The SARS-CoV-2 RNA is generally detectable in upper and lower respiratory specimens during the acute phase of infection. The lowest concentration of SARS-CoV-2 viral copies this assay can detect is 250 copies / mL. Deandrea Vanpelt negative result does not preclude SARS-CoV-2 infection and should not be used as the sole basis for treatment or other patient management decisions.  Leomia Blake negative result may occur with improper specimen collection / handling, submission of specimen other than nasopharyngeal swab, presence of viral mutation(s) within the areas targeted by this assay, and inadequate number of viral copies (<250 copies / mL). Roshni Burbano negative result must be combined with clinical observations, patient history, and epidemiological information.  Fact Sheet for Patients:   roadlaptop.co.za  Fact Sheet for Healthcare Providers: http://kim-miller.com/  This test is not yet  approved or  cleared by the [place]  FDA and has been authorized for detection and/or diagnosis of SARS-CoV-2 by FDA under an Emergency Use Authorization (EUA).  This EUA will remain in effect (meaning this test can be used) for the duration of the COVID-19 declaration under Section 564(b)(1) of the Act, 21 U.S.C. section 360bbb-3(b)(1), unless the authorization is terminated or revoked sooner.  Performed  at [facility], [address], [place], [place] [address]          Radiology Studies: ECHOCARDIOGRAM COMPLETE Result Date: 04/19/2023    ECHOCARDIOGRAM REPORT   Patient Name:   Sandra Cuevas Date of Exam: 04/19/2023 Medical Rec #:  [id number]     Height:       66.0 in Accession #:    [id number]    Weight:       200.0 lb Date of Birth:  Sep 15, 1928      BSA:          2.000 m Patient Age:    89 years      BP:           165/63 mmHg Patient Gender: F             HR:           81 bpm. Exam Location:  [facility] Procedure: 2D Echo, Cardiac Doppler and Color Doppler Indications:    Other abnormalities of the heart R00.8  History:        Patient has prior history of Echocardiogram examinations, most                 recent 08/11/2019. Risk Factors:Hypertension, Diabetes,                 Dyslipidemia and Former Smoker. GERD.  Sonographer:    Aida Pizza RCS Referring Phys: [phone] Vickie Melnik CALDWELL POWELL JR IMPRESSIONS  1. Left ventricular ejection fraction, by estimation, is 60 to 65%. The left ventricle has normal function. The left ventricle has no regional wall motion abnormalities. There is mild left ventricular hypertrophy. Left ventricular diastolic parameters are consistent with Grade II diastolic dysfunction (pseudonormalization). Elevated left atrial pressure.  2. Right ventricular systolic function is normal. The right ventricular size is normal.  3. Left atrial size was severely dilated.  4. Mean gradient through the MV is 5 mm Hg (HR 67 bpm). Overall consistent with mild MS. Mild mitral valve  regurgitation. Severe mitral annular calcification.  5. AV is difficult to see well Peak and mean gradients through the valve are 18 and 10 mm Hg respectively AVA (VTI is 1.58 cm2 Dimensionless index is 0.55 Overall consistent with mild AS No significant change from previous echo . The aortic valve is tricuspid. Aortic valve regurgitation is not visualized. FINDINGS  Left Ventricle: Left ventricular ejection fraction, by estimation, is 60 to 65%. The left ventricle has normal function. The left ventricle has no regional wall motion abnormalities. The left ventricular internal cavity size was normal in size. There is  mild left ventricular hypertrophy. Left ventricular diastolic parameters are consistent with Grade II diastolic dysfunction (pseudonormalization). Elevated left atrial pressure. Right Ventricle: The right ventricular size is normal. Right vetricular wall thickness was not assessed. Right ventricular systolic function is normal. Left Atrium: Left atrial size was severely dilated. Right Atrium: Right atrial size was normal in size. Pericardium: Trivial pericardial effusion is present. Mitral Valve: Mean gradient through the MV is 5 mm Hg (HR 67 bpm). Overall consistent with mild MS. There is moderate thickening of the mitral valve leaflet(s). There is mild calcification of the mitral valve leaflet(s). Severe mitral annular calcification. Mild mitral valve regurgitation. MV peak gradient, 14.0 mmHg. The mean mitral valve gradient is 5.0 mmHg. Tricuspid Valve: The tricuspid valve is normal in structure. Tricuspid valve regurgitation is trivial. Aortic Valve: AV is difficult to see well Peak and mean gradients through the valve are  18 and 10 mm Hg respectively AVA (VTI is 1.58 cm2 Dimensionless index is 0.55 Overall consistent with mild AS No significant change from previous echo. The aortic valve is tricuspid. Aortic valve regurgitation is not visualized. Aortic valve mean gradient measures 11.0 mmHg.  Aortic valve peak gradient measures 19.9 mmHg. Aortic valve area, by VTI measures 1.56 cm. Pulmonic Valve: The pulmonic valve was grossly normal. Pulmonic valve regurgitation is not visualized. Aorta: The aortic root is normal in size and structure. IAS/Shunts: No atrial level shunt detected by color flow Doppler.  LEFT VENTRICLE PLAX 2D LVIDd:         4.70 cm   Diastology LVIDs:         3.20 cm   LV e' medial:    4.13 cm/s LV PW:         1.10 cm   LV E/e' medial:  45.5 LV IVS:        1.20 cm   LV e' lateral:   4.68 cm/s LVOT diam:     1.90 cm   LV E/e' lateral: 40.2 LV SV:         85 LV SV Index:   43 LVOT Area:     2.84 cm  RIGHT VENTRICLE RV S prime:     15.00 cm/s TAPSE (M-mode): 2.7 cm LEFT ATRIUM              Index        RIGHT ATRIUM           Index LA diam:        4.40 cm  2.20 cm/m   RA Area:     16.80 cm LA Vol (A2C):   116.0 ml 58.01 ml/m  RA Volume:   49.00 ml  24.50 ml/m LA Vol (A4C):   128.0 ml 64.01 ml/m LA Biplane Vol: 125.0 ml 62.51 ml/m  AORTIC VALVE AV Area (Vmax):    1.54 cm AV Area (Vmean):   1.58 cm AV Area (VTI):     1.56 cm AV Vmax:           223.25 cm/s AV Vmean:          155.500 cm/s AV VTI:            0.547 m AV Peak Grad:      19.9 mmHg AV Mean Grad:      11.0 mmHg LVOT Vmax:         121.00 cm/s LVOT Vmean:        86.400 cm/s LVOT VTI:          0.300 m LVOT/AV VTI ratio: 0.55  AORTA Ao Root diam: 3.20 cm MITRAL VALVE MV Area (PHT): 2.07 cm     SHUNTS MV Area VTI:   1.39 cm     Systemic VTI:  0.30 m MV Peak grad:  14.0 mmHg    Systemic Diam: 1.90 cm MV Mean grad:  5.0 mmHg MV Vmax:       1.87 m/s MV Vmean:      98.3 cm/s MV Decel Time: 366 msec MV E velocity: 188.00 cm/s MV Imraan Wendell velocity: 148.00 cm/s MV E/Makhia Vosler ratio:  1.27 Vina Gull MD Electronically signed by Vina Gull MD Signature Date/Time: 04/19/2023/2:08:18 PM    Final    DG CHEST PORT 1 VIEW Result Date: 04/18/2023 CLINICAL DATA:  Shortness of breath. Chest tightness. Intermittently productive cough. EXAM: PORTABLE CHEST 1  VIEW COMPARISON:  04/14/2023; 05/16/2021 FINDINGS: Unchanged cardiac silhouette and mediastinal  contours with atherosclerotic plaque within the thoracic aorta. Pulmonary vasculature is indistinct with cephalization of flow. Small/trace bilateral effusions and associated bibasilar heterogeneous/consolidative opacities. No pneumothorax. No acute osseous abnormalities. Post right hemi humeral arthroplasty. Moderate to severe degenerative change of the left glenohumeral joint, incompletely evaluated. IMPRESSION: Similar findings of pulmonary edema with small/trace bilateral effusions and associated bibasilar opacities, atelectasis versus infiltrate, right-greater-than-left. Electronically Signed   By: Norleen Roulette M.D.   On: 04/18/2023 14:02         Scheduled Meds:  amLODipine   10 mg Oral Daily   atorvastatin   40 mg Oral QPC supper   enoxaparin  (LOVENOX ) injection  40 mg Subcutaneous Q24H   famotidine   20 mg Oral Daily   fluticasone   2 spray Each Nare Daily   gabapentin   600 mg Oral TID   insulin  aspart  0-6 Units Subcutaneous TID WC   levothyroxine   50 mcg Oral Daily   pantoprazole   40 mg Oral QHS   Continuous Infusions:  cefTRIAXone  (ROCEPHIN )  IV 1 g (04/19/23 2218)      LOS: 6 days    Time spent: over 30 min    Meliton Monte, MD [facility]   To contact the attending provider between 7A-7P or the covering provider during after hours 7P-7A, please log into the web site [email or web address] and access using universal [place] password for that web site. If you do not have the password, please call the hospital operator.  04/20/2023, 10:15 AM

## 2023-04-20 NOTE — Plan of Care (Signed)
 Rested well during the night. No complaints. Oxygen  sats remain WNL while on 2l oxygen  via nasal cannula.  Problem: Education: Goal: Ability to describe self-care measures that may prevent or decrease complications (Diabetes Survival Skills Education) will improve Outcome: Progressing Goal: Individualized Educational Video(s) Outcome: Progressing   Problem: Coping: Goal: Ability to adjust to condition or change in health will improve Outcome: Progressing   Problem: Fluid Volume: Goal: Ability to maintain a balanced intake and output will improve Outcome: Progressing   Problem: Health Behavior/Discharge Planning: Goal: Ability to identify and utilize available resources and services will improve Outcome: Progressing Goal: Ability to manage health-related needs will improve Outcome: Progressing   Problem: Metabolic: Goal: Ability to maintain appropriate glucose levels will improve Outcome: Progressing   Problem: Nutritional: Goal: Maintenance of adequate nutrition will improve Outcome: Progressing Goal: Progress toward achieving an optimal weight will improve Outcome: Progressing   Problem: Skin Integrity: Goal: Risk for impaired skin integrity will decrease Outcome: Progressing   Problem: Tissue Perfusion: Goal: Adequacy of tissue perfusion will improve Outcome: Progressing   Problem: Education: Goal: Knowledge of General Education information will improve Description: Including pain rating scale, medication(s)/side effects and non-pharmacologic comfort measures Outcome: Progressing   Problem: Health Behavior/Discharge Planning: Goal: Ability to manage health-related needs will improve Outcome: Progressing   Problem: Clinical Measurements: Goal: Ability to maintain clinical measurements within normal limits will improve Outcome: Progressing Goal: Will remain free from infection Outcome: Progressing Goal: Diagnostic test results will improve Outcome:  Progressing Goal: Respiratory complications will improve Outcome: Progressing Goal: Cardiovascular complication will be avoided Outcome: Progressing   Problem: Activity: Goal: Risk for activity intolerance will decrease Outcome: Progressing   Problem: Nutrition: Goal: Adequate nutrition will be maintained Outcome: Progressing   Problem: Coping: Goal: Level of anxiety will decrease Outcome: Progressing   Problem: Elimination: Goal: Will not experience complications related to bowel motility Outcome: Progressing Goal: Will not experience complications related to urinary retention Outcome: Progressing   Problem: Pain Management: Goal: General experience of comfort will improve Outcome: Progressing   Problem: Safety: Goal: Ability to remain free from injury will improve Outcome: Progressing   Problem: Skin Integrity: Goal: Risk for impaired skin integrity will decrease Outcome: Progressing

## 2023-04-21 DIAGNOSIS — J9601 Acute respiratory failure with hypoxia: Secondary | ICD-10-CM | POA: Diagnosis not present

## 2023-04-21 LAB — CBC WITH DIFFERENTIAL/PLATELET
Abs Immature Granulocytes: 0.02 10*3/uL (ref 0.00–0.07)
Basophils Absolute: 0 10*3/uL (ref 0.0–0.1)
Basophils Relative: 0 %
Eosinophils Absolute: 0.3 10*3/uL (ref 0.0–0.5)
Eosinophils Relative: 5 %
HCT: 28.3 % — ABNORMAL LOW (ref 36.0–46.0)
Hemoglobin: 8.5 g/dL — ABNORMAL LOW (ref 12.0–15.0)
Immature Granulocytes: 0 %
Lymphocytes Relative: 24 %
Lymphs Abs: 1.6 10*3/uL (ref 0.7–4.0)
MCH: 31.1 pg (ref 26.0–34.0)
MCHC: 30 g/dL (ref 30.0–36.0)
MCV: 103.7 fL — ABNORMAL HIGH (ref 80.0–100.0)
Monocytes Absolute: 1 10*3/uL (ref 0.1–1.0)
Monocytes Relative: 15 %
Neutro Abs: 3.6 10*3/uL (ref 1.7–7.7)
Neutrophils Relative %: 56 %
Platelets: 162 10*3/uL (ref 150–400)
RBC: 2.73 MIL/uL — ABNORMAL LOW (ref 3.87–5.11)
RDW: 13.9 % (ref 11.5–15.5)
WBC: 6.6 10*3/uL (ref 4.0–10.5)
nRBC: 0 % (ref 0.0–0.2)

## 2023-04-21 LAB — COMPREHENSIVE METABOLIC PANEL
ALT: 25 U/L (ref 0–44)
AST: 26 U/L (ref 15–41)
Albumin: 2.5 g/dL — ABNORMAL LOW (ref 3.5–5.0)
Alkaline Phosphatase: 50 U/L (ref 38–126)
Anion gap: 6 (ref 5–15)
BUN: 33 mg/dL — ABNORMAL HIGH (ref 8–23)
CO2: 37 mmol/L — ABNORMAL HIGH (ref 22–32)
Calcium: 8.6 mg/dL — ABNORMAL LOW (ref 8.9–10.3)
Chloride: 94 mmol/L — ABNORMAL LOW (ref 98–111)
Creatinine, Ser: 1.08 mg/dL — ABNORMAL HIGH (ref 0.44–1.00)
GFR, Estimated: 48 mL/min — ABNORMAL LOW (ref >60)
Glucose, Bld: 129 mg/dL — ABNORMAL HIGH (ref 70–99)
Potassium: 4.2 mmol/L (ref 3.5–5.1)
Sodium: 137 mmol/L (ref 135–145)
Total Bilirubin: 0.5 mg/dL (ref 0.0–1.2)
Total Protein: 5.3 g/dL — ABNORMAL LOW (ref 6.5–8.1)

## 2023-04-21 LAB — GLUCOSE, CAPILLARY
Glucose-Capillary: 130 mg/dL — ABNORMAL HIGH (ref 70–99)
Glucose-Capillary: 116 mg/dL — ABNORMAL HIGH (ref 70–99)
Glucose-Capillary: 161 mg/dL — ABNORMAL HIGH (ref 70–99)
Glucose-Capillary: 192 mg/dL — ABNORMAL HIGH (ref 70–99)
Glucose-Capillary: 243 mg/dL — ABNORMAL HIGH (ref 70–99)

## 2023-04-21 LAB — PHOSPHORUS: Phosphorus: 3.2 mg/dL (ref 2.5–4.6)

## 2023-04-21 LAB — MAGNESIUM: Magnesium: 1.9 mg/dL (ref 1.7–2.4)

## 2023-04-21 MED ORDER — PREDNISONE 20 MG PO TABS: 20.0000 mg | ORAL_TABLET | Freq: Every day | ORAL | Status: DC

## 2023-04-21 MED ORDER — IPRATROPIUM-ALBUTEROL 0.5-2.5 (3) MG/3ML IN SOLN
3.0000 mL | Freq: Two times a day (BID) | RESPIRATORY_TRACT | Status: DC
  Administered 2023-04-21 – 2023-04-22 (×2): 3 mL via RESPIRATORY_TRACT
  Filled 2023-04-21 (×2): qty 3

## 2023-04-21 MED ORDER — PREDNISONE 20 MG PO TABS
20.0000 mg | ORAL_TABLET | Freq: Every day | ORAL | Status: DC
  Administered 2023-04-21 – 2023-04-22 (×2): 20 mg via ORAL
  Filled 2023-04-21 (×2): qty 1

## 2023-04-21 NOTE — Progress Notes (Signed)
 PROGRESS NOTE    THAI HEMRICK  FMW:[id number] DOB: 1929-03-07 DOA: 04/14/2023 PCP: Marvine Rush, MD  No chief complaint on file.   Brief Narrative:   Sandra Cuevas is [name] 89 y.o. female with hx of COPD/asthma, HFpEF, hypertension, hyperlipidemia, diabetes, hypothyroidism, remote GYN cancer status post hysterectomy who presented due to worsening shortness of breath and hypoxia at home.  Patient was admitted with acute hypoxemic respiratory failure secondary to multifocal pneumonia.   Assessment & Plan:   Principal Problem:   Acute hypoxic respiratory failure (HCC)  Multifocal pneumonia, suspect viral Acute hypoxic respiratory failure, requiring 2L O2 - CT chest with evidence of multifocal pneumonia, small bilateral effusions - Continue ceftriaxone  (plan for 7 days), azithromycin  - Negative covid/flu, sputum culture -- intermittent wheezing, which is mild, will give short steroid course - wean o2 as tolerated  HFpEF, without acute exacerbation:  Clinically, not overloaded, but wonder if volume overload could be contributing to SOB above She describes what sounds like orthopnea.  No significant edema on exam. S/p IV lasix  1/10.  Worsening alkalosis today, will hold additional diuresis.  Repeat echo -> EF 60-65%, no rwma.  Grade II diastolic dysfunction.  mild MS. Mild AS.    Metabolic Alkalosis  Respiratory Acidosis  - some degree of chronicity - compensation for chronic respiratory acidosis  Dysphagia - will request SLP eval -> regular, thin  Troponin elevation Likely demand ischemia with flat trend - Treatment directed at pneumonia, hypoxia per above   Incidental findings:  Lung nodule left lower lobe up to the 5 mm: Recommended for repeat CT in 3 to 6 months (if within goals of care) Mediastinal and hilar adenopathy which is stable. 17 mm nodular area in inferior right breast - needs follow up with mammogram outpatient  Chronic medical problems: COPD/asthma: Albuterol   as needed, continue home Singulair  - has had some mild wheezing occasionally on exam, will give short course of steroids  Hypertension: Continue home amlodipine  Hyperlipidemia: Continue home atorvastatin  Diabetes: SSI while inpatient Neuropathy: Continue her home gabapentin  Hypothyroidism: Continue her home levothyroxine  History of remote gyn cancer with hysterectomy: Outpatient surveillance.   Insomnia Per discussion with daughter, takes 2.5 ambien  nightly  Body mass index is 31.88 kg/m. Obesity class I affecting medical care       DVT prophylaxis: lovenox  Code Status: DNR Family Communication: none Disposition:   Status is: Inpatient Remains inpatient appropriate because: need for continued inpatient care   Consultants:  none  Procedures:  Echo IMPRESSIONS     1. Left ventricular ejection fraction, by estimation, is 60 to 65%. The  left ventricle has normal function. The left ventricle has no regional  wall motion abnormalities. There is mild left ventricular hypertrophy.  Left ventricular diastolic parameters  are consistent with Grade II diastolic dysfunction (pseudonormalization).  Elevated left atrial pressure.   2. Right ventricular systolic function is normal. The right ventricular  size is normal.   3. Left atrial size was severely dilated.   4. Mean gradient through the MV is 5 mm Hg (HR 67 bpm). Overall  consistent with mild MS. Mild mitral valve regurgitation. Severe mitral  annular calcification.   5. AV is difficult to see well Peak and mean gradients through the valve  are 18 and 10 mm Hg respectively AVA (VTI is 1.58 cm2 Dimensionless index  is 0.55 Overall consistent with mild AS No significant change from  previous echo . The aortic valve is  tricuspid. Aortic valve regurgitation is not visualized.  Antimicrobials:  Anti-infectives (From admission, onward)    Start     Dose/Rate Route Frequency Ordered Stop   04/19/23 2200  cefTRIAXone   (ROCEPHIN ) 1 g in sodium chloride  0.9 % 100 mL IVPB        1 g 200 mL/hr over 30 Minutes Intravenous Every 24 hours 04/19/23 0923 04/20/23 2158   04/15/23 2300  cefTRIAXone  (ROCEPHIN ) 1 g in sodium chloride  0.9 % 100 mL IVPB        1 g 200 mL/hr over 30 Minutes Intravenous Every 24 hours 04/14/23 2217 04/19/23 0001   04/15/23 2200  azithromycin  (ZITHROMAX ) tablet 500 mg        500 mg Oral Daily at bedtime 04/14/23 2217 04/16/23 2112   04/14/23 2230  azithromycin  (ZITHROMAX ) 500 mg in sodium chloride  0.9 % 250 mL IVPB        500 mg 250 mL/hr over 60 Minutes Intravenous  Once 04/14/23 2217 04/15/23 0130   04/14/23 2145  cefTRIAXone  (ROCEPHIN ) 2 g in sodium chloride  0.9 % 100 mL IVPB        2 g 200 mL/hr over 30 Minutes Intravenous  Once 04/14/23 2135 04/14/23 2349   04/14/23 2145  azithromycin  (ZITHROMAX ) 500 mg in sodium chloride  0.9 % 250 mL IVPB  Status:  Discontinued        500 mg 250 mL/hr over 60 Minutes Intravenous Every 24 hours 04/14/23 2135 04/14/23 2217       Subjective: No complaints  Objective: Vitals:   04/20/23 2046 04/21/23 0531 04/21/23 0600 04/21/23 0732  BP:   (!) 141/82   Pulse:   77   Resp:   20   Temp:   98.6 F (37 C)   TempSrc:   Oral   SpO2: 93%  93% 93%  Weight:  91.2 kg    Height:        Intake/Output Summary (Last 24 hours) at 04/21/2023 1253 Last data filed at 04/21/2023 1100 Gross per 24 hour  Intake 720 ml  Output 1000 ml  Net -280 ml   Filed Weights   04/19/23 0500 04/20/23 0500 04/21/23 0531  Weight: 90.7 kg 91.6 kg 91.2 kg    Examination:  General: No acute distress. Cardiovascular: RRR Lungs: diminished Abdomen: Soft, nontender, nondistended  Neurological: Alert and oriented 3. Moves all extremities 4 with equal strength. Cranial nerves II through XII grossly intact. Extremities: No clubbing or cyanosis. No edema.   Data Reviewed: I have personally reviewed following labs and imaging studies  CBC: Recent Labs  Lab  04/14/23 1833 04/15/23 0414 04/16/23 0422 04/18/23 0404 04/19/23 0757 04/20/23 0400 04/21/23 0336  WBC 8.3   < > 8.5 7.8 6.9 6.2 6.6  NEUTROABS 5.6  --   --  4.7 4.2 3.5 3.6  HGB 10.2*   < > 9.6* 9.2* 9.5* 9.3* 8.5*  HCT 33.4*   < > 31.5* 30.2* 30.4* 29.8* 28.3*  MCV 105.0*   < > 103.6* 103.8* 103.4* 104.2* 103.7*  PLT 146*   < > 160 156 168 165 162   < > = values in this interval not displayed.    Basic Metabolic Panel: Recent Labs  Lab 04/15/23 0414 04/16/23 0422 04/18/23 0404 04/19/23 0757 04/20/23 0400 04/21/23 0336  NA 138 139 136 138 138 137  K 3.9 4.1 4.4 4.2 4.3 4.2  CL 92* 94* 95* 94* 94* 94*  CO2 38* 37* 36* 39* 38* 37*  GLUCOSE 132* 129* 119* 126* 126* 129*  BUN 14 21  28* 27* 31* 33*  CREATININE 0.79 0.97 1.00 0.92 0.98 1.08*  CALCIUM  9.4 9.1 8.6* 8.8* 8.7* 8.6*  MG 1.5* 1.8 1.9 1.8 1.9 1.9  PHOS 4.4  --  3.4 2.8 3.0 3.2    GFR: Estimated Creatinine Clearance: 37.8 mL/min (Jermeka Schlotterbeck) (by C-G formula based on SCr of 1.08 mg/dL (H)).  Liver Function Tests: Recent Labs  Lab 04/14/23 1833 04/18/23 0404 04/19/23 0757 04/20/23 0400 04/21/23 0336  AST 23 25 24 25 26   ALT 21 22 23 24 25   ALKPHOS 67 55 53 54 50  BILITOT 0.2 0.5 0.5 0.4 0.5  PROT 5.7* 5.7* 5.8* 5.6* 5.3*  ALBUMIN 2.9* 2.6* 2.6* 2.5* 2.5*    CBG: Recent Labs  Lab 04/20/23 1137 04/20/23 1640 04/21/23 0117 04/21/23 0739 04/21/23 1118  GLUCAP 134* 217* 130* 116* 161*     Recent Results (from the past 240 hours)  Respiratory (~20 pathogens) panel by PCR     Status: None   Collection Time: 04/14/23 10:30 PM   Specimen: Nasopharyngeal Swab; Respiratory  Result Value Ref Range Status   Adenovirus NOT DETECTED NOT DETECTED Final   Coronavirus 229E NOT DETECTED NOT DETECTED Final    Comment: (NOTE) The Coronavirus on the Respiratory Panel, DOES NOT test for the novel  Coronavirus (2019 nCoV)    Coronavirus HKU1 NOT DETECTED NOT DETECTED Final   Coronavirus NL63 NOT DETECTED NOT DETECTED  Final   Coronavirus OC43 NOT DETECTED NOT DETECTED Final   Metapneumovirus NOT DETECTED NOT DETECTED Final   Rhinovirus / Enterovirus NOT DETECTED NOT DETECTED Final   Influenza Ilisa Hayworth NOT DETECTED NOT DETECTED Final   Influenza B NOT DETECTED NOT DETECTED Final   Parainfluenza Virus 1 NOT DETECTED NOT DETECTED Final   Parainfluenza Virus 2 NOT DETECTED NOT DETECTED Final   Parainfluenza Virus 3 NOT DETECTED NOT DETECTED Final   Parainfluenza Virus 4 NOT DETECTED NOT DETECTED Final   Respiratory Syncytial Virus NOT DETECTED NOT DETECTED Final   Bordetella pertussis NOT DETECTED NOT DETECTED Final   Bordetella Parapertussis NOT DETECTED NOT DETECTED Final   Chlamydophila pneumoniae NOT DETECTED NOT DETECTED Final   Mycoplasma pneumoniae NOT DETECTED NOT DETECTED Final    Comment: Performed at [facility], [address] [place], [place] [address]  SARS Coronavirus 2 by RT PCR (hospital order, performed in [facility] hospital lab) *cepheid single result test* Anterior Nasal Swab     Status: None   Collection Time: 04/14/23 10:30 PM   Specimen: Anterior Nasal Swab  Result Value Ref Range Status   SARS Coronavirus 2 by RT PCR NEGATIVE NEGATIVE Final    Comment: (NOTE) SARS-CoV-2 target nucleic acids are NOT DETECTED.  The SARS-CoV-2 RNA is generally detectable in upper and lower respiratory specimens during the acute phase of infection. The lowest concentration of SARS-CoV-2 viral copies this assay can detect is 250 copies / mL. Erlin Gardella negative result does not preclude SARS-CoV-2 infection and should not be used as the sole basis for treatment or other patient management decisions.  Charvis Lightner negative result may occur with improper specimen collection / handling, submission of specimen other than nasopharyngeal swab, presence of viral mutation(s) within the areas targeted by this assay, and inadequate number of viral copies (<250 copies / mL). Sion Thane negative result must be combined with  clinical observations, patient history, and epidemiological information.  Fact Sheet for Patients:   roadlaptop.co.za  Fact Sheet for Healthcare Providers: http://kim-miller.com/  This test is not yet approved or  cleared by the [place]  FDA and has been authorized for detection and/or diagnosis of SARS-CoV-2 by FDA under an Emergency Use Authorization (EUA).  This EUA will remain in effect (meaning this test can be used) for the duration of the COVID-19 declaration under Section 564(b)(1) of the Act, 21 U.S.C. section 360bbb-3(b)(1), unless the authorization is terminated or revoked sooner.  Performed at [facility], [address] [place], [place] [address]          Radiology Studies: ECHOCARDIOGRAM COMPLETE Result Date: 04/19/2023    ECHOCARDIOGRAM REPORT   Patient Name:   SHAIRA SOVA Date of Exam: 04/19/2023 Medical Rec #:  [id number]     Height:       66.0 in Accession #:    [id number]    Weight:       200.0 lb Date of Birth:  13-Dec-1928      BSA:          2.000 m Patient Age:    89 years      BP:           165/63 mmHg Patient Gender: F             HR:           81 bpm. Exam Location:  [facility] Procedure: 2D Echo, Cardiac Doppler and Color Doppler Indications:    Other abnormalities of the heart R00.8  History:        Patient has prior history of Echocardiogram examinations, most                 recent 08/11/2019. Risk Factors:Hypertension, Diabetes,                 Dyslipidemia and Former Smoker. GERD.  Sonographer:    Aida Pizza RCS Referring Phys: [phone] Mihira Tozzi CALDWELL POWELL JR IMPRESSIONS  1. Left ventricular ejection fraction, by estimation, is 60 to 65%. The left ventricle has normal function. The left ventricle has no regional wall motion abnormalities. There is mild left ventricular hypertrophy. Left ventricular diastolic parameters are consistent with Grade II diastolic dysfunction (pseudonormalization). Elevated left atrial  pressure.  2. Right ventricular systolic function is normal. The right ventricular size is normal.  3. Left atrial size was severely dilated.  4. Mean gradient through the MV is 5 mm Hg (HR 67 bpm). Overall consistent with mild MS. Mild mitral valve regurgitation. Severe mitral annular calcification.  5. AV is difficult to see well Peak and mean gradients through the valve are 18 and 10 mm Hg respectively AVA (VTI is 1.58 cm2 Dimensionless index is 0.55 Overall consistent with mild AS No significant change from previous echo . The aortic valve is tricuspid. Aortic valve regurgitation is not visualized. FINDINGS  Left Ventricle: Left ventricular ejection fraction, by estimation, is 60 to 65%. The left ventricle has normal function. The left ventricle has no regional wall motion abnormalities. The left ventricular internal cavity size was normal in size. There is  mild left ventricular hypertrophy. Left ventricular diastolic parameters are consistent with Grade II diastolic dysfunction (pseudonormalization). Elevated left atrial pressure. Right Ventricle: The right ventricular size is normal. Right vetricular wall thickness was not assessed. Right ventricular systolic function is normal. Left Atrium: Left atrial size was severely dilated. Right Atrium: Right atrial size was normal in size. Pericardium: Trivial pericardial effusion is present. Mitral Valve: Mean gradient through the MV is 5 mm Hg (HR 67 bpm). Overall consistent with mild MS. There is moderate thickening of  the mitral valve leaflet(s). There is mild calcification of the mitral valve leaflet(s). Severe mitral annular calcification. Mild mitral valve regurgitation. MV peak gradient, 14.0 mmHg. The mean mitral valve gradient is 5.0 mmHg. Tricuspid Valve: The tricuspid valve is normal in structure. Tricuspid valve regurgitation is trivial. Aortic Valve: AV is difficult to see well Peak and mean gradients through the valve are 18 and 10 mm Hg respectively  AVA (VTI is 1.58 cm2 Dimensionless index is 0.55 Overall consistent with mild AS No significant change from previous echo. The aortic valve is tricuspid. Aortic valve regurgitation is not visualized. Aortic valve mean gradient measures 11.0 mmHg. Aortic valve peak gradient measures 19.9 mmHg. Aortic valve area, by VTI measures 1.56 cm. Pulmonic Valve: The pulmonic valve was grossly normal. Pulmonic valve regurgitation is not visualized. Aorta: The aortic root is normal in size and structure. IAS/Shunts: No atrial level shunt detected by color flow Doppler.  LEFT VENTRICLE PLAX 2D LVIDd:         4.70 cm   Diastology LVIDs:         3.20 cm   LV e' medial:    4.13 cm/s LV PW:         1.10 cm   LV E/e' medial:  45.5 LV IVS:        1.20 cm   LV e' lateral:   4.68 cm/s LVOT diam:     1.90 cm   LV E/e' lateral: 40.2 LV SV:         85 LV SV Index:   43 LVOT Area:     2.84 cm  RIGHT VENTRICLE RV S prime:     15.00 cm/s TAPSE (M-mode): 2.7 cm LEFT ATRIUM              Index        RIGHT ATRIUM           Index LA diam:        4.40 cm  2.20 cm/m   RA Area:     16.80 cm LA Vol (A2C):   116.0 ml 58.01 ml/m  RA Volume:   49.00 ml  24.50 ml/m LA Vol (A4C):   128.0 ml 64.01 ml/m LA Biplane Vol: 125.0 ml 62.51 ml/m  AORTIC VALVE AV Area (Vmax):    1.54 cm AV Area (Vmean):   1.58 cm AV Area (VTI):     1.56 cm AV Vmax:           223.25 cm/s AV Vmean:          155.500 cm/s AV VTI:            0.547 m AV Peak Grad:      19.9 mmHg AV Mean Grad:      11.0 mmHg LVOT Vmax:         121.00 cm/s LVOT Vmean:        86.400 cm/s LVOT VTI:          0.300 m LVOT/AV VTI ratio: 0.55  AORTA Ao Root diam: 3.20 cm MITRAL VALVE MV Area (PHT): 2.07 cm     SHUNTS MV Area VTI:   1.39 cm     Systemic VTI:  0.30 m MV Peak grad:  14.0 mmHg    Systemic Diam: 1.90 cm MV Mean grad:  5.0 mmHg MV Vmax:       1.87 m/s MV Vmean:      98.3 cm/s MV Decel Time: 366 msec MV E velocity: 188.00 cm/s MV  Rowan Blaker velocity: 148.00 cm/s MV E/Kadija Cruzen ratio:  1.27 Vina Gull MD  Electronically signed by Vina Gull MD Signature Date/Time: 04/19/2023/2:08:18 PM    Final          Scheduled Meds:  amLODipine   10 mg Oral Daily   atorvastatin   40 mg Oral QPC supper   enoxaparin  (LOVENOX ) injection  40 mg Subcutaneous Q24H   famotidine   20 mg Oral Daily   fluticasone   2 spray Each Nare Daily   gabapentin   600 mg Oral TID   insulin  aspart  0-6 Units Subcutaneous TID WC   ipratropium-albuterol   3 mL Nebulization BID   levothyroxine   50 mcg Oral Daily   pantoprazole   40 mg Oral QHS   Continuous Infusions:      LOS: 7 days    Time spent: over 30 min    Meliton Monte, MD [facility]   To contact the attending provider between 7A-7P or the covering provider during after hours 7P-7A, please log into the web site [email or web address] and access using universal [place] password for that web site. If you do not have the password, please call the hospital operator.  04/21/2023, 12:53 PM

## 2023-04-21 NOTE — Progress Notes (Signed)
 Mobility Specialist Progress Note:    04/21/23 1045  Mobility  Activity Ambulated with assistance in hallway  Level of Assistance Contact guard assist, steadying assist  Assistive Device Front wheel walker  Distance Ambulated (ft) 150 ft  Range of Motion/Exercises Active;All extremities  Activity Response Tolerated well  Mobility Referral Yes  Mobility visit 1 Mobility  Mobility Specialist Start Time (ACUTE ONLY) 1025  Mobility Specialist Stop Time (ACUTE ONLY) 1045  Mobility Specialist Time Calculation (min) (ACUTE ONLY) 20 min   Pt received in chair, agreeable to mobility. Required CGA to stand and ambulate with RW. Tolerated well, see O2 sats below. Returned to room, left pt in chair. Call bell in reach, all needs met.  SpO2 91% on 1L at rest SpO2 85% on 1L during ambulation SpO2 89% on 2L during ambulation SpO2 92% on 1L after ambulation   Sherrilee Ditty Mobility Specialist Please contact via SecureChat or  Rehab office at [phone]

## 2023-04-21 NOTE — TOC Initial Note (Signed)
 Transition of Care [facility]) - Initial/Assessment Note    Patient Details  Name: Sandra Cuevas MRN: [id number] Date of Birth: 12/03/1928  [facility]) CM/SW Contact:    Rollo Petri, LCSW Phone Number: 04/21/2023, 1:16 PM  Clinical Narrative:                  Pt admitted from home. She has a high readmission risk score. Spoke with pt's daughter today for initial assessment.  Per dtr, pt resides alone. She is independent in ADLs. Pt has a walker for DME. Family drives pt to appointments and they pick up her medications for her.   PT recommending HHPT at dc. TOC will follow up with pt closer to dc to offer referral. If Home O2 needed at dc, TOC will arrange as well.  Will follow.  Expected Discharge Plan: Home w Home Health Services Barriers to Discharge: Continued Medical Work up   Patient Goals and CMS Choice Patient states their goals for this hospitalization and ongoing recovery are:: return home          Expected Discharge Plan and Services In-house Referral: Clinical Social Work     Living arrangements for the past 2 months: Single Family Home                                      Prior Living Arrangements/Services Living arrangements for the past 2 months: Single Family Home Lives with:: Self Patient language and need for interpreter reviewed:: Yes Do you feel safe going back to the place where you live?: Yes      Need for Family Participation in Patient Care: Yes (Comment) Care giver support system in place?: Yes (comment) Current home services: DME Criminal Activity/Legal Involvement Pertinent to Current Situation/Hospitalization: No - Comment as needed  Activities of Daily Living   ADL Screening (condition at time of admission) Independently performs ADLs?: Yes (appropriate for developmental age) Is the patient deaf or have difficulty hearing?: No Does the patient have difficulty seeing, even when wearing glasses/contacts?: No Does the  patient have difficulty concentrating, remembering, or making decisions?: No  Permission Sought/Granted                  Emotional Assessment       Orientation: : Oriented to Self, Oriented to Place, Oriented to  Time, Oriented to Situation Alcohol  / Substance Use: Not Applicable Psych Involvement: No (comment)  Admission diagnosis:  Multifocal pneumonia [J18.9] Acute hypoxic respiratory failure (HCC) [J96.01] Patient Active Problem List   Diagnosis Date Noted   Acute hypoxic respiratory failure (HCC) 04/14/2023   Belching 03/27/2022   Upper airway cough syndrome 10/03/2021   Carotid artery disease (HCC) 07/03/2021   Aortic stenosis 07/03/2021   Mixed stress and urge urinary incontinence 07/13/2020   Rectocele 07/13/2020   Constipation 07/13/2020   S/P hysterectomy with oophorectomy 07/13/2020   Pelvic pressure in female 07/13/2020   Bilateral sciatica 07/13/2020   Encounter for screening fecal occult blood testing 07/13/2020   Dyspnea 08/10/2019   CKD (chronic kidney disease) stage 3, GFR 30-59 ml/min (HCC) 08/10/2019   Right rib fracture 03/21/2019   Acute respiratory failure (HCC) 03/20/2019   Fever 10/08/2018   Chronic rhinitis 11/17/2017   Pulmonary nodule, right 03/10/2017   GERD (gastroesophageal reflux disease) 10/14/2016   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   Chest pain 07/14/2016   Diabetes mellitus, type II (  HCC) 07/14/2016   CKD (chronic kidney disease), stage II 07/14/2016   Cellulitis 07/12/2016   Cellulitis of right leg 07/12/2016   (HFpEF) heart failure with preserved ejection fraction (HCC) 04/30/2016   Hoarse voice quality 09/05/2015   Chronic obstructive airway disease with asthma (HCC) 04/20/2013   Snoring 04/20/2013   Premature ventricular contractions 03/23/2013   Change in stool 01/06/2012   Shortness of breath 09/26/2011   Carpal tunnel syndrome 11/06/2010   Hyperlipidemia LDL goal <70    Atypical chest pain    Hypothyroidism     Diabetes mellitus (HCC)    EXTERNAL HEMORRHOIDS WITHOUT MENTION COMP 02/12/2010   RECTAL BLEEDING 02/12/2010   CERVICAL CANCER 10/18/2009   Dyslipidemia 10/18/2009   Essential hypertension 10/18/2009   Cough 10/18/2009   PCP:  Marvine Rush, MD Pharmacy:   [facility] [phone] - [place], [place] - [address] AT [facility] OF [address] & [address] [place] [address] Phone: [phone] Fax: [phone]     Social Drivers of Health (SDOH) Social History: SDOH Screenings   Food Insecurity: No Food Insecurity (04/15/2023)  Housing: Low Risk  (04/15/2023)  Transportation Needs: No Transportation Needs (04/15/2023)  Utilities: Not At Risk (04/15/2023)  Alcohol  Screen: Low Risk  (07/13/2020)  Depression (PHQ2-9): Medium Risk (07/13/2020)  Financial Resource Strain: Low Risk  (07/13/2020)  Physical Activity: Inactive (07/13/2020)  Social Connections: Moderately Integrated (04/15/2023)  Stress: No Stress Concern Present (07/13/2020)  Tobacco Use: Low Risk  (04/14/2023)   SDOH Interventions: Housing Interventions: Intervention Not Indicated, Inpatient TOC   Readmission Risk Interventions    04/21/2023    1:15 PM  Readmission Risk Prevention Plan  Transportation Screening Complete  HRI or Home Care Consult Complete  Social Work Consult for Recovery Care Planning/Counseling Complete  Palliative Care Screening Not Applicable  Medication Review Oceanographer) Complete

## 2023-04-22 DIAGNOSIS — J9601 Acute respiratory failure with hypoxia: Secondary | ICD-10-CM | POA: Diagnosis not present

## 2023-04-22 LAB — CBC WITH DIFFERENTIAL/PLATELET
Abs Immature Granulocytes: 0.02 10*3/uL (ref 0.00–0.07)
Basophils Absolute: 0 10*3/uL (ref 0.0–0.1)
Basophils Relative: 0 %
Eosinophils Absolute: 0 10*3/uL (ref 0.0–0.5)
Eosinophils Relative: 0 %
HCT: 29 % — ABNORMAL LOW (ref 36.0–46.0)
Hemoglobin: 8.8 g/dL — ABNORMAL LOW (ref 12.0–15.0)
Immature Granulocytes: 0 %
Lymphocytes Relative: 18 %
Lymphs Abs: 0.9 10*3/uL (ref 0.7–4.0)
MCH: 31.2 pg (ref 26.0–34.0)
MCHC: 30.3 g/dL (ref 30.0–36.0)
MCV: 102.8 fL — ABNORMAL HIGH (ref 80.0–100.0)
Monocytes Absolute: 0.4 10*3/uL (ref 0.1–1.0)
Monocytes Relative: 7 %
Neutro Abs: 3.9 10*3/uL (ref 1.7–7.7)
Neutrophils Relative %: 75 %
Platelets: 154 10*3/uL (ref 150–400)
RBC: 2.82 MIL/uL — ABNORMAL LOW (ref 3.87–5.11)
RDW: 13.6 % (ref 11.5–15.5)
WBC: 5.2 10*3/uL (ref 4.0–10.5)
nRBC: 0 % (ref 0.0–0.2)

## 2023-04-22 LAB — COMPREHENSIVE METABOLIC PANEL
ALT: 28 U/L (ref 0–44)
AST: 29 U/L (ref 15–41)
Albumin: 2.6 g/dL — ABNORMAL LOW (ref 3.5–5.0)
Alkaline Phosphatase: 61 U/L (ref 38–126)
Anion gap: 5 (ref 5–15)
BUN: 32 mg/dL — ABNORMAL HIGH (ref 8–23)
CO2: 36 mmol/L — ABNORMAL HIGH (ref 22–32)
Calcium: 8.9 mg/dL (ref 8.9–10.3)
Chloride: 97 mmol/L — ABNORMAL LOW (ref 98–111)
Creatinine, Ser: 1.03 mg/dL — ABNORMAL HIGH (ref 0.44–1.00)
GFR, Estimated: 51 mL/min — ABNORMAL LOW (ref >60)
Glucose, Bld: 162 mg/dL — ABNORMAL HIGH (ref 70–99)
Potassium: 4.8 mmol/L (ref 3.5–5.1)
Sodium: 138 mmol/L (ref 135–145)
Total Bilirubin: 0.5 mg/dL (ref 0.0–1.2)
Total Protein: 5.6 g/dL — ABNORMAL LOW (ref 6.5–8.1)

## 2023-04-22 LAB — PHOSPHORUS: Phosphorus: 3 mg/dL (ref 2.5–4.6)

## 2023-04-22 LAB — GLUCOSE, CAPILLARY
Glucose-Capillary: 132 mg/dL — ABNORMAL HIGH (ref 70–99)
Glucose-Capillary: 107 mg/dL — ABNORMAL HIGH (ref 70–99)

## 2023-04-22 LAB — MAGNESIUM: Magnesium: 2.2 mg/dL (ref 1.7–2.4)

## 2023-04-22 MED ORDER — PREDNISONE 20 MG PO TABS: 20.0000 mg | ORAL_TABLET | Freq: Every day | ORAL | 0 refills | Status: AC

## 2023-04-22 MED ORDER — ZOLPIDEM TARTRATE 5 MG PO TABS: 2.5000 mg | ORAL_TABLET | Freq: Every evening | ORAL | Status: DC | PRN

## 2023-04-22 NOTE — TOC Transition Note (Signed)
 Transition of Care [facility]) - Discharge Note   Patient Details  Name: Sandra Cuevas MRN: [id number] Date of Birth: 05-Jul-1928  Transition of Care [facility]) CM/SW Contact:  Rollo Petri, LCSW Phone Number: 04/22/2023, 2:38 PM   Clinical Narrative:     Pt stable for dc per MD. Home O2 and HHPT ordered. Spoke with pt and son at Bedside to review dc needs. Pt agreeable to the Home O2 and the HH. CMS provider options reviewed. Referred as requested. Portable tank for dc brought to room.  No other TOC needs for dc.  Final next level of care: Home w Home Health Services Barriers to Discharge: Barriers Resolved   Patient Goals and CMS Choice Patient states their goals for this hospitalization and ongoing recovery are:: return home CMS Medicare.gov Compare Post Acute Care list provided to:: Patient Choice offered to / list presented to : Patient      Discharge Placement                       Discharge Plan and Services Additional resources added to the After Visit Summary for   In-house Referral: Clinical Social Work   Post Acute Care Choice: Durable Medical Equipment, Home Health          DME Arranged: Oxygen  DME Agency: [facility] Date DME Agency Contacted: 04/22/23   Representative spoke with at DME Agency: Rosina HH Arranged: PT HH Agency: [facility] Date [facility] Agency Contacted: 04/22/23   Representative spoke with at [facility] Agency: Darleene  Social Drivers of Health (SDOH) Interventions SDOH Screenings   Food Insecurity: No Food Insecurity (04/15/2023)  Housing: Low Risk  (04/15/2023)  Transportation Needs: No Transportation Needs (04/15/2023)  Utilities: Not At Risk (04/15/2023)  Alcohol  Screen: Low Risk  (07/13/2020)  Depression (PHQ2-9): Medium Risk (07/13/2020)  Financial Resource Strain: Low Risk  (07/13/2020)  Physical Activity: Inactive (07/13/2020)  Social Connections: Moderately Integrated (04/15/2023)  Stress: No Stress Concern Present (07/13/2020)  Tobacco Use: Low Risk   (04/14/2023)     Readmission Risk Interventions    04/21/2023    1:15 PM  Readmission Risk Prevention Plan  Transportation Screening Complete  HRI or Home Care Consult Complete  Social Work Consult for Recovery Care Planning/Counseling Complete  Palliative Care Screening Not Applicable  Medication Review Oceanographer) Complete

## 2023-04-22 NOTE — Discharge Summary (Addendum)
 Physician Discharge Summary  Sandra Cuevas FMW:[id number] DOB: [birth year] DOA: 04/14/2023  PCP: Marvine Rush, MD  Admit date: 04/14/2023 Discharge date: 04/22/2023  Time spent: 40 minutes  Recommendations for Outpatient Follow-up:  Follow outpatient CBC/CMP  Follow oxygen  needs outpatient - wean as tolerated Consider pulmonary follow up if persistent/not improving Needs CT scan in 3 months for nodular densities in left lower lobe Needs mammogram for 17 mm nodular area in inferior right breast She notes some chronic LE weakness (2 months or so, none notable today on exam), needs to be followed outpatient   Discharge Diagnoses:  Principal Problem:   Acute hypoxic respiratory failure (HCC)   Discharge Condition: stable  Diet recommendation: heart healthy, diabetic  Filed Weights   04/20/23 0500 04/21/23 0531 04/22/23 0457  Weight: 91.6 kg 91.2 kg 94.7 kg    History of present illness:   Sandra Cuevas is Sandra Cuevas 89 y.o. female with hx of COPD/asthma, HFpEF, hypertension, hyperlipidemia, diabetes, hypothyroidism, remote GYN cancer status post hysterectomy who presented due to worsening shortness of breath and hypoxia at home.  Patient was admitted with acute hypoxemic respiratory failure secondary to multifocal pneumonia.   She's gradually improved on abx.  She's continuing to need oxygen  at the time of discharge.  Will need to wean outpatient.   Hospital Course:  Assessment and Plan:  Multifocal pneumonia, suspect viral Acute hypoxic respiratory failure, requiring 2L O2 COPD  Asthma - CT chest with evidence of multifocal pneumonia, small bilateral effusions (no PE) - completed abx with ceftriaxone /azithromycin   - Negative covid/flu, sputum culture -- steroids, 3 day course - wean o2 as tolerated -> she'll need o2 at discharge, 3 L with activity/rest - wean outpatient as tolerated   HFpEF, without acute exacerbation:  Clinically, not overloaded She describes what sounds like  orthopnea -> now resolved.  No significant edema on exam. S/p IV lasix  1/10.   Additional diuresis on hold without notable edema/overload on exam Repeat echo -> EF 60-65%, no rwma.  Grade II diastolic dysfunction.  mild MS. Mild AS.     Metabolic Alkalosis  Respiratory Acidosis  - some degree of chronicity - compensation for chronic respiratory acidosis   Dysphagia - will request SLP eval -> regular, thin   Troponin elevation Likely demand ischemia with flat trend - Treatment directed at pneumonia, hypoxia per above   Lower Extremity Weakness - chronic, none notable on exam today Follow outpatient   Incidental findings:  Lung nodule left lower lobe up to the 5 mm: Recommended for repeat CT in 3 to 6 months (if within goals of care) Mediastinal and hilar adenopathy which is stable. 17 mm nodular area in inferior right breast - needs follow up with mammogram outpatient   Chronic medical problems: COPD/asthma: Albuterol  as needed, continue home Singulair  - has had some mild wheezing occasionally on exam, will give short course of steroids Hypertension: Continue home amlodipine  Hyperlipidemia: Continue home atorvastatin  Diabetes: SSI while inpatient -> A1c 6.6 Neuropathy: Continue her home gabapentin  Hypothyroidism: Continue her home levothyroxine  History of remote gyn cancer with hysterectomy: Outpatient surveillance.   Insomnia Per discussion with daughter, takes 2.5 ambien  nightly   Body mass index is 31.88 kg/m. Obesity class I affecting medical care       Procedures: Echo IMPRESSIONS     1. Left ventricular ejection fraction, by estimation, is 60 to 65%. The  left ventricle has normal function. The left ventricle has no regional  wall motion abnormalities. There is mild left ventricular  hypertrophy.  Left ventricular diastolic parameters  are consistent with Grade II diastolic dysfunction (pseudonormalization).  Elevated left atrial pressure.   2. Right  ventricular systolic function is normal. The right ventricular  size is normal.   3. Left atrial size was severely dilated.   4. Mean gradient through the MV is 5 mm Hg (HR 67 bpm). Overall  consistent with mild MS. Mild mitral valve regurgitation. Severe mitral  annular calcification.   5. AV is difficult to see well Peak and mean gradients through the valve  are 18 and 10 mm Hg respectively AVA (VTI is 1.58 cm2 Dimensionless index  is 0.55 Overall consistent with mild AS No significant change from  previous echo . The aortic valve is  tricuspid. Aortic valve regurgitation is not visualized.    Consultations: none  Discharge Exam: Vitals:   04/22/23 0600 04/22/23 1356  BP: 110/64 (!) 152/49  Pulse: 70 75  Resp: 16 18  Temp: 98.8 F (37.1 C)   SpO2: 97%    Feeling more like herself. Still cough No more orthopnea Discussed d/c plan with granddaughter, patient, and son  General: No acute distress. Cardiovascular: RRR Lungs: unlabored Abdomen: Soft, nontender, nondistended Neurological: Alert and oriented 3. Moves all extremities 4 with equal strength. Cranial nerves II through XII grossly intact. Extremities: No clubbing or cyanosis. No edema.   Discharge Instructions   Discharge Instructions     Call MD for:  difficulty breathing, headache or visual disturbances   Complete by: As directed    Call MD for:  extreme fatigue   Complete by: As directed    Call MD for:  hives   Complete by: As directed    Call MD for:  persistant dizziness or light-headedness   Complete by: As directed    Call MD for:  persistant nausea and vomiting   Complete by: As directed    Call MD for:  redness, tenderness, or signs of infection (pain, swelling, redness, odor or green/yellow discharge around incision site)   Complete by: As directed    Call MD for:  severe uncontrolled pain   Complete by: As directed    Call MD for:  temperature >100.4   Complete by: As directed    Diet -  low sodium heart healthy   Complete by: As directed    Discharge instructions   Complete by: As directed    You were seen for pneumonia.  I suspect this may have been Sandra Cuevas viral pneumonia given the imaging findings.  You've improved with antibiotics and supportive care, but you still need supplemental oxygen .  We'll send you home with oxygen  to use at rest and with activity (3 liters).  I started you on low dose prednisone  04/21/23.  You'll finish this tomorrow, 1/15.   You should follow up with your outpatient doctor to see if your oxygen  can be weaned.  My suspicion is that with time, the o2 will be able to be weaned.  If note, you should follow up with Kou Gucciardo lung doctor to consider additional workup.  There's Ileta Ofarrell possibility you may need oxygen  long term given your history of lung disease (COPD).    Continue your home breathing treatments as needed.  You have imaging findings that warrant outpatient follow up.  You need Schawn Byas repeat CT scan within 3 months to follow up nodular densities in the left lower lobe (lung) measuring up to 5 mm.  After that, you'll need Jael Kostick follow up CT scan.  There  was Arlee Santosuosso 17 mm nodular area in the inferior right breast.  This should be followed with Dion Parrow mammogram.  Return if you have new, recurrent, or worsening symptoms.  Please ask your PCP to request records from this hospitalization so they know what was done and what the next steps will be.   Increase activity slowly   Complete by: As directed       Allergies as of 04/22/2023   No Known Allergies      Medication List     TAKE these medications    acetaminophen  500 MG tablet Commonly known as: TYLENOL  Take 2 tablets (1,000 mg total) by mouth every 6 (six) hours as needed.   albuterol  (2.5 MG/3ML) 0.083% nebulizer solution Commonly known as: PROVENTIL  Take 3 mLs (2.5 mg total) by nebulization every 6 (six) hours as needed for wheezing or shortness of breath.   amLODipine  10 MG tablet Commonly known as:  NORVASC  Take 1 tablet (10 mg total) by mouth daily.   atorvastatin  40 MG tablet Commonly known as: LIPITOR TAKE 1 TABLET(40 MG) BY MOUTH DAILY AT 6 PM   famotidine  20 MG tablet Commonly known as: PEPCID  Take 1 tablet (20 mg total) by mouth 2 (two) times daily. What changed: when to take this   fluticasone  50 MCG/ACT nasal spray Commonly known as: FLONASE  SHAKE LIQUID AND USE 2 SPRAYS IN EACH NOSTRIL DAILY   gabapentin  600 MG tablet Commonly known as: NEURONTIN  Take 600 mg by mouth 3 (three) times daily.   levothyroxine  50 MCG tablet Commonly known as: SYNTHROID  Take 50 mcg by mouth daily.   omeprazole  20 MG capsule Commonly known as: PRILOSEC Take 1 capsule (20 mg total) by mouth daily. What changed: when to take this   potassium chloride  10 MEQ CR capsule Commonly known as: MICRO-K  Take 10 mEq by mouth daily. Take one capsule by mouth only when you take your lasix .   predniSONE  20 MG tablet Commonly known as: DELTASONE  Take 1 tablet (20 mg total) by mouth daily with breakfast for 1 day. Start taking on: April 23, 2023   PRESERVISION AREDS 2+MULTI VIT PO Take 1 tablet by mouth daily.   zolpidem  5 MG tablet Commonly known as: AMBIEN  Take 0.5 tablets (2.5 mg total) by mouth at bedtime as needed for sleep. What changed:  medication strength how much to take when to take this reasons to take this               Durable Medical Equipment  (From admission, onward)           Start     Ordered   04/22/23 1353  For home use only DME oxygen   Once       Comments: Patient desats to 83% on RA at rest.  Requires 3 L at rest and with activity to maintain sats over 88%.  Question Answer Comment  Length of Need 6 Months   Mode or (Route) Nasal cannula   Liters per Minute 3   Frequency Continuous (stationary and portable oxygen  unit needed)   Oxygen  delivery system Gas      04/22/23 1352           No Known Allergies    The results of significant  diagnostics from this hospitalization (including imaging, microbiology, ancillary and laboratory) are listed below for reference.    Significant Diagnostic Studies: ECHOCARDIOGRAM COMPLETE Result Date: 04/19/2023    ECHOCARDIOGRAM REPORT   Patient Name:   ALISSANDRA GEOFFROY Date of  Exam: 04/19/2023 Medical Rec #:  [id number]     Height:       66.0 in Accession #:    [id number]    Weight:       200.0 lb Date of Birth:  1929-01-16      BSA:          2.000 m Patient Age:    89 years      BP:           165/63 mmHg Patient Gender: F             HR:           81 bpm. Exam Location:  [facility] Procedure: 2D Echo, Cardiac Doppler and Color Doppler Indications:    Other abnormalities of the heart R00.8  History:        Patient has prior history of Echocardiogram examinations, most                 recent 08/11/2019. Risk Factors:Hypertension, Diabetes,                 Dyslipidemia and Former Smoker. GERD.  Sonographer:    Aida Pizza RCS Referring Phys: [phone] Nanami Whitelaw CALDWELL POWELL JR IMPRESSIONS  1. Left ventricular ejection fraction, by estimation, is 60 to 65%. The left ventricle has normal function. The left ventricle has no regional wall motion abnormalities. There is mild left ventricular hypertrophy. Left ventricular diastolic parameters are consistent with Grade II diastolic dysfunction (pseudonormalization). Elevated left atrial pressure.  2. Right ventricular systolic function is normal. The right ventricular size is normal.  3. Left atrial size was severely dilated.  4. Mean gradient through the MV is 5 mm Hg (HR 67 bpm). Overall consistent with mild MS. Mild mitral valve regurgitation. Severe mitral annular calcification.  5. AV is difficult to see well Peak and mean gradients through the valve are 18 and 10 mm Hg respectively AVA (VTI is 1.58 cm2 Dimensionless index is 0.55 Overall consistent with mild AS No significant change from previous echo . The aortic valve is tricuspid. Aortic valve regurgitation is not  visualized. FINDINGS  Left Ventricle: Left ventricular ejection fraction, by estimation, is 60 to 65%. The left ventricle has normal function. The left ventricle has no regional wall motion abnormalities. The left ventricular internal cavity size was normal in size. There is  mild left ventricular hypertrophy. Left ventricular diastolic parameters are consistent with Grade II diastolic dysfunction (pseudonormalization). Elevated left atrial pressure. Right Ventricle: The right ventricular size is normal. Right vetricular wall thickness was not assessed. Right ventricular systolic function is normal. Left Atrium: Left atrial size was severely dilated. Right Atrium: Right atrial size was normal in size. Pericardium: Trivial pericardial effusion is present. Mitral Valve: Mean gradient through the MV is 5 mm Hg (HR 67 bpm). Overall consistent with mild MS. There is moderate thickening of the mitral valve leaflet(s). There is mild calcification of the mitral valve leaflet(s). Severe mitral annular calcification. Mild mitral valve regurgitation. MV peak gradient, 14.0 mmHg. The mean mitral valve gradient is 5.0 mmHg. Tricuspid Valve: The tricuspid valve is normal in structure. Tricuspid valve regurgitation is trivial. Aortic Valve: AV is difficult to see well Peak and mean gradients through the valve are 18 and 10 mm Hg respectively AVA (VTI is 1.58 cm2 Dimensionless index is 0.55 Overall consistent with mild AS No significant change from previous echo. The aortic valve is tricuspid. Aortic valve regurgitation is not visualized. Aortic valve mean gradient measures  11.0 mmHg. Aortic valve peak gradient measures 19.9 mmHg. Aortic valve area, by VTI measures 1.56 cm. Pulmonic Valve: The pulmonic valve was grossly normal. Pulmonic valve regurgitation is not visualized. Aorta: The aortic root is normal in size and structure. IAS/Shunts: No atrial level shunt detected by color flow Doppler.  LEFT VENTRICLE PLAX 2D LVIDd:          4.70 cm   Diastology LVIDs:         3.20 cm   LV e' medial:    4.13 cm/s LV PW:         1.10 cm   LV E/e' medial:  45.5 LV IVS:        1.20 cm   LV e' lateral:   4.68 cm/s LVOT diam:     1.90 cm   LV E/e' lateral: 40.2 LV SV:         85 LV SV Index:   43 LVOT Area:     2.84 cm  RIGHT VENTRICLE RV S prime:     15.00 cm/s TAPSE (M-mode): 2.7 cm LEFT ATRIUM              Index        RIGHT ATRIUM           Index LA diam:        4.40 cm  2.20 cm/m   RA Area:     16.80 cm LA Vol (A2C):   116.0 ml 58.01 ml/m  RA Volume:   49.00 ml  24.50 ml/m LA Vol (A4C):   128.0 ml 64.01 ml/m LA Biplane Vol: 125.0 ml 62.51 ml/m  AORTIC VALVE AV Area (Vmax):    1.54 cm AV Area (Vmean):   1.58 cm AV Area (VTI):     1.56 cm AV Vmax:           223.25 cm/s AV Vmean:          155.500 cm/s AV VTI:            0.547 m AV Peak Grad:      19.9 mmHg AV Mean Grad:      11.0 mmHg LVOT Vmax:         121.00 cm/s LVOT Vmean:        86.400 cm/s LVOT VTI:          0.300 m LVOT/AV VTI ratio: 0.55  AORTA Ao Root diam: 3.20 cm MITRAL VALVE MV Area (PHT): 2.07 cm     SHUNTS MV Area VTI:   1.39 cm     Systemic VTI:  0.30 m MV Peak grad:  14.0 mmHg    Systemic Diam: 1.90 cm MV Mean grad:  5.0 mmHg MV Vmax:       1.87 m/s MV Vmean:      98.3 cm/s MV Decel Time: 366 msec MV E velocity: 188.00 cm/s MV Khadeeja Elden velocity: 148.00 cm/s MV E/Deloy Archey ratio:  1.27 Vina Gull MD Electronically signed by Vina Gull MD Signature Date/Time: 04/19/2023/2:08:18 PM    Final    DG CHEST PORT 1 VIEW Result Date: 04/18/2023 CLINICAL DATA:  Shortness of breath. Chest tightness. Intermittently productive cough. EXAM: PORTABLE CHEST 1 VIEW COMPARISON:  04/14/2023; 05/16/2021 FINDINGS: Unchanged cardiac silhouette and mediastinal contours with atherosclerotic plaque within the thoracic aorta. Pulmonary vasculature is indistinct with cephalization of flow. Small/trace bilateral effusions and associated bibasilar heterogeneous/consolidative opacities. No pneumothorax. No acute osseous  abnormalities. Post right hemi humeral arthroplasty. Moderate to severe degenerative change of the  left glenohumeral joint, incompletely evaluated. IMPRESSION: Similar findings of pulmonary edema with small/trace bilateral effusions and associated bibasilar opacities, atelectasis versus infiltrate, right-greater-than-left. Electronically Signed   By: Norleen Roulette M.D.   On: 04/18/2023 14:02   CT Angio Chest PE W and/or Wo Contrast Result Date: 04/14/2023 CLINICAL DATA:  Positive D-dimer.  Shortness of breath. EXAM: CT ANGIOGRAPHY CHEST WITH CONTRAST TECHNIQUE: Multidetector CT imaging of the chest was performed using the standard protocol during bolus administration of intravenous contrast. Multiplanar CT image reconstructions and MIPs were obtained to evaluate the vascular anatomy. RADIATION DOSE REDUCTION: This exam was performed according to the departmental dose-optimization program which includes automated exposure control, adjustment of the mA and/or kV according to patient size and/or use of iterative reconstruction technique. CONTRAST:  OMNIPAQUE  IOHEXOL  350 MG/ML SOLN COMPARISON:  CT angiogram chest 08/10/2019 FINDINGS: Cardiovascular: Heart is enlarged. There is no pericardial effusion. Aorta is normal in size. There are atherosclerotic calcifications of the aorta. There is adequate opacification of the pulmonary arteries to the segmental level. No pulmonary embolism identified. Mediastinum/Nodes: There are enlarged precarinal lymph nodes measuring up to 13 mm. There is an enlarged subcarinal lymph node measuring 11 mm. There are enlarged right hilar lymph nodes measuring up to 12 mm. These are all similar to prior study. Visualized thyroid  gland and esophagus are within normal limits. Lungs/Pleura: There are small bilateral pleural effusions similar to the prior examination. There is Satia Winger new focal area of airspace disease with surrounding ground-glass opacity in the superior segment of the right  lower lobe and minimally in the adjacent right upper lobe. There also new patchy airspace and ground-glass opacities minimally in the central left upper lobe. Atelectatic changes are seen in the bilateral lower lobes, right middle lobe and lingula. There is no pneumothorax. Trachea and central airways are patent. There some new nodular densities in the left lower lobe measuring up to 5 mm on image 6/90. Upper Abdomen: No acute abnormality. Musculoskeletal: There is Richad Ramsay rounded 17 mm nodular area in the inferior right breast which was not seen on the prior examination image 4/67. Right shoulder arthroplasty is present. No acute fractures are seen. Review of the MIP images confirms the above findings. IMPRESSION: 1. No evidence for pulmonary embolism. 2. New focal area of airspace disease with ground-glass opacities in the bilateral upper lobes. Findings are worrisome for multifocal pneumonia. 3. Stable small bilateral pleural effusions. 4. Stable mediastinal and right hilar lymphadenopathy. 5. New nodular densities in the left lower lobe measuring up to 5 mm. Non-contrast chest CT at 3-6 months is recommended. If the nodules are stable at time of repeat CT, then future CT at 18-24 months (from today's scan) is considered optional for low-risk patients, but is recommended for high-risk patients. 6. 17 mm nodular area in the inferior right breast was not seen on the prior examination. Correlate with mammography. 7. Aortic atherosclerosis. Aortic Atherosclerosis (ICD10-I70.0). Electronically Signed   By: Greig Pique M.D.   On: 04/14/2023 21:25   DG Chest Port 1 View Result Date: 04/14/2023 CLINICAL DATA:  Shortness of breath. EXAM: PORTABLE CHEST 1 VIEW COMPARISON:  05/16/2021 and CT chest 08/10/2019. FINDINGS: Patient is slightly rotated. Heart is enlarged and accentuated by semi upright AP technique. Thoracic aorta is calcified. Volume with possible mild basilar interstitial prominence. Slight blunting of the  costophrenic angles. Right shoulder arthroplasty. IMPRESSION: Question mild congestive heart failure. Electronically Signed   By: Newell Eke M.D.   On: 04/14/2023 18:29  Microbiology: Recent Results (from the past 240 hours)  Respiratory (~20 pathogens) panel by PCR     Status: None   Collection Time: 04/14/23 10:30 PM   Specimen: Nasopharyngeal Swab; Respiratory  Result Value Ref Range Status   Adenovirus NOT DETECTED NOT DETECTED Final   Coronavirus 229E NOT DETECTED NOT DETECTED Final    Comment: (NOTE) The Coronavirus on the Respiratory Panel, DOES NOT test for the novel  Coronavirus (2019 nCoV)    Coronavirus HKU1 NOT DETECTED NOT DETECTED Final   Coronavirus NL63 NOT DETECTED NOT DETECTED Final   Coronavirus OC43 NOT DETECTED NOT DETECTED Final   Metapneumovirus NOT DETECTED NOT DETECTED Final   Rhinovirus / Enterovirus NOT DETECTED NOT DETECTED Final   Influenza Nuria Phebus NOT DETECTED NOT DETECTED Final   Influenza B NOT DETECTED NOT DETECTED Final   Parainfluenza Virus 1 NOT DETECTED NOT DETECTED Final   Parainfluenza Virus 2 NOT DETECTED NOT DETECTED Final   Parainfluenza Virus 3 NOT DETECTED NOT DETECTED Final   Parainfluenza Virus 4 NOT DETECTED NOT DETECTED Final   Respiratory Syncytial Virus NOT DETECTED NOT DETECTED Final   Bordetella pertussis NOT DETECTED NOT DETECTED Final   Bordetella Parapertussis NOT DETECTED NOT DETECTED Final   Chlamydophila pneumoniae NOT DETECTED NOT DETECTED Final   Mycoplasma pneumoniae NOT DETECTED NOT DETECTED Final    Comment: Performed at [facility], [address] [place], [place] [address]  SARS Coronavirus 2 by RT PCR (hospital order, performed in [facility] hospital lab) *cepheid single result test* Anterior Nasal Swab     Status: None   Collection Time: 04/14/23 10:30 PM   Specimen: Anterior Nasal Swab  Result Value Ref Range Status   SARS Coronavirus 2 by RT PCR NEGATIVE NEGATIVE Final    Comment: (NOTE) SARS-CoV-2  target nucleic acids are NOT DETECTED.  The SARS-CoV-2 RNA is generally detectable in upper and lower respiratory specimens during the acute phase of infection. The lowest concentration of SARS-CoV-2 viral copies this assay can detect is 250 copies / mL. Corlene Sabia negative result does not preclude SARS-CoV-2 infection and should not be used as the sole basis for treatment or other patient management decisions.  Kla Bily negative result may occur with improper specimen collection / handling, submission of specimen other than nasopharyngeal swab, presence of viral mutation(s) within the areas targeted by this assay, and inadequate number of viral copies (<250 copies / mL). Anaira Seay negative result must be combined with clinical observations, patient history, and epidemiological information.  Fact Sheet for Patients:   roadlaptop.co.za  Fact Sheet for Healthcare Providers: http://kim-miller.com/  This test is not yet approved or  cleared by the [place]  FDA and has been authorized for detection and/or diagnosis of SARS-CoV-2 by FDA under an Emergency Use Authorization (EUA).  This EUA will remain in effect (meaning this test can be used) for the duration of the COVID-19 declaration under Section 564(b)(1) of the Act, 21 U.S.C. section 360bbb-3(b)(1), unless the authorization is terminated or revoked sooner.  Performed at [facility], [address] [place], [place] [address]      Labs: Basic Metabolic Panel: Recent Labs  Lab 04/18/23 0404 04/19/23 0757 04/20/23 0400 04/21/23 0336 04/22/23 0502  NA 136 138 138 137 138  K 4.4 4.2 4.3 4.2 4.8  CL 95* 94* 94* 94* 97*  CO2 36* 39* 38* 37* 36*  GLUCOSE 119* 126* 126* 129* 162*  BUN 28* 27* 31* 33* 32*  CREATININE 1.00 0.92 0.98 1.08* 1.03*  CALCIUM  8.6* 8.8* 8.7* 8.6* 8.9  MG 1.9 1.8 1.9 1.9 2.2  PHOS 3.4 2.8 3.0 3.2 3.0   Liver Function Tests: Recent Labs  Lab 04/18/23 0404 04/19/23 0757  04/20/23 0400 04/21/23 0336 04/22/23 0502  AST 25 24 25 26 29   ALT 22 23 24 25 28   ALKPHOS 55 53 54 50 61  BILITOT 0.5 0.5 0.4 0.5 0.5  PROT 5.7* 5.8* 5.6* 5.3* 5.6*  ALBUMIN 2.6* 2.6* 2.5* 2.5* 2.6*   No results for input(s): LIPASE, AMYLASE in the last 168 hours. No results for input(s): AMMONIA in the last 168 hours. CBC: Recent Labs  Lab 04/18/23 0404 04/19/23 0757 04/20/23 0400 04/21/23 0336 04/22/23 0502  WBC 7.8 6.9 6.2 6.6 5.2  NEUTROABS 4.7 4.2 3.5 3.6 3.9  HGB 9.2* 9.5* 9.3* 8.5* 8.8*  HCT 30.2* 30.4* 29.8* 28.3* 29.0*  MCV 103.8* 103.4* 104.2* 103.7* 102.8*  PLT 156 168 165 162 154   Cardiac Enzymes: No results for input(s): CKTOTAL, CKMB, CKMBINDEX, TROPONINI in the last 168 hours. BNP: BNP (last 3 results) Recent Labs    04/14/23 1833 04/18/23 0404  BNP 98.0 48.0    ProBNP (last 3 results) No results for input(s): PROBNP in the last 8760 hours.  CBG: Recent Labs  Lab 04/21/23 1118 04/21/23 1619 04/21/23 2127 04/22/23 0742 04/22/23 1127  GLUCAP 161* 192* 243* 132* 107*       Signed:  Meliton Monte MD.  [facility] 04/22/2023, 2:00 PM

## 2023-04-22 NOTE — Care Management Important Message (Signed)
 Important Message  Patient Details  Name: Sandra Cuevas MRN: [id number] Date of Birth: 02-16-1929   Important Message Given:  Yes - Medicare IM     Corey Harold 04/22/2023, 11:33 AM

## 2023-04-22 NOTE — Progress Notes (Signed)
 Mobility Specialist Progress Note:    04/22/23 1015  Mobility  Activity Ambulated with assistance in hallway  Level of Assistance Standby assist, set-up cues, supervision of patient - no hands on  Assistive Device Front wheel walker  Distance Ambulated (ft) 140 ft  Range of Motion/Exercises Active;All extremities  Activity Response Tolerated well  Mobility Referral Yes  Mobility visit 1 Mobility  Mobility Specialist Start Time (ACUTE ONLY) 0955  Mobility Specialist Stop Time (ACUTE ONLY) 1015  Mobility Specialist Time Calculation (min) (ACUTE ONLY) 20 min   Pt received in chair, agreeable to mobility. Required SBA to stand and ambulate with RW. Tolerated well, see O2 sats below. Pt denies SOB. Returned to chair, call bell in reach. Notified nurse of O2, all needs met.   SpO2 83% on RA at rest SpO2 91% on 3L at rest SpO2 90% on 3L during ambulation SpO2 97% on 3L after session Spo2 94% on .5L after session  Sherrilee Ditty Mobility Specialist Please contact via SecureChat or  Rehab office at [phone]

## 2023-04-29 DIAGNOSIS — E6609 Other obesity due to excess calories: Secondary | ICD-10-CM | POA: Diagnosis not present

## 2023-04-29 DIAGNOSIS — Z6832 Body mass index (BMI) 32.0-32.9, adult: Secondary | ICD-10-CM | POA: Diagnosis not present

## 2023-04-29 DIAGNOSIS — N649 Disorder of breast, unspecified: Secondary | ICD-10-CM | POA: Diagnosis not present

## 2023-04-29 DIAGNOSIS — R911 Solitary pulmonary nodule: Secondary | ICD-10-CM | POA: Diagnosis not present

## 2023-04-29 DIAGNOSIS — J189 Pneumonia, unspecified organism: Secondary | ICD-10-CM | POA: Diagnosis not present

## 2023-05-05 ENCOUNTER — Other Ambulatory Visit (HOSPITAL_COMMUNITY): Payer: Self-pay | Admitting: Family Medicine

## 2023-05-05 DIAGNOSIS — N63 Unspecified lump in unspecified breast: Secondary | ICD-10-CM

## 2023-05-08 DIAGNOSIS — M25511 Pain in right shoulder: Secondary | ICD-10-CM | POA: Diagnosis not present

## 2023-05-11 DIAGNOSIS — J4489 Other specified chronic obstructive pulmonary disease: Secondary | ICD-10-CM | POA: Diagnosis not present

## 2023-05-11 DIAGNOSIS — E1122 Type 2 diabetes mellitus with diabetic chronic kidney disease: Secondary | ICD-10-CM | POA: Diagnosis not present

## 2023-05-11 DIAGNOSIS — Z6834 Body mass index (BMI) 34.0-34.9, adult: Secondary | ICD-10-CM | POA: Diagnosis not present

## 2023-05-11 DIAGNOSIS — R911 Solitary pulmonary nodule: Secondary | ICD-10-CM | POA: Diagnosis not present

## 2023-05-11 DIAGNOSIS — R1310 Dysphagia, unspecified: Secondary | ICD-10-CM | POA: Diagnosis not present

## 2023-05-11 DIAGNOSIS — I5030 Unspecified diastolic (congestive) heart failure: Secondary | ICD-10-CM | POA: Diagnosis not present

## 2023-05-11 DIAGNOSIS — I110 Hypertensive heart disease with heart failure: Secondary | ICD-10-CM | POA: Diagnosis not present

## 2023-05-11 DIAGNOSIS — E785 Hyperlipidemia, unspecified: Secondary | ICD-10-CM | POA: Diagnosis not present

## 2023-05-11 DIAGNOSIS — E1140 Type 2 diabetes mellitus with diabetic neuropathy, unspecified: Secondary | ICD-10-CM | POA: Diagnosis not present

## 2023-05-11 DIAGNOSIS — D509 Iron deficiency anemia, unspecified: Secondary | ICD-10-CM | POA: Diagnosis not present

## 2023-05-11 DIAGNOSIS — Z96611 Presence of right artificial shoulder joint: Secondary | ICD-10-CM | POA: Diagnosis not present

## 2023-05-11 DIAGNOSIS — D630 Anemia in neoplastic disease: Secondary | ICD-10-CM | POA: Diagnosis not present

## 2023-05-11 DIAGNOSIS — Z7951 Long term (current) use of inhaled steroids: Secondary | ICD-10-CM | POA: Diagnosis not present

## 2023-05-11 DIAGNOSIS — E66811 Obesity, class 1: Secondary | ICD-10-CM | POA: Diagnosis not present

## 2023-05-11 DIAGNOSIS — D126 Benign neoplasm of colon, unspecified: Secondary | ICD-10-CM | POA: Diagnosis not present

## 2023-05-11 DIAGNOSIS — Z9181 History of falling: Secondary | ICD-10-CM | POA: Diagnosis not present

## 2023-05-11 DIAGNOSIS — E039 Hypothyroidism, unspecified: Secondary | ICD-10-CM | POA: Diagnosis not present

## 2023-05-11 DIAGNOSIS — G4700 Insomnia, unspecified: Secondary | ICD-10-CM | POA: Diagnosis not present

## 2023-05-11 DIAGNOSIS — Z9981 Dependence on supplemental oxygen: Secondary | ICD-10-CM | POA: Diagnosis not present

## 2023-05-11 DIAGNOSIS — J9601 Acute respiratory failure with hypoxia: Secondary | ICD-10-CM | POA: Diagnosis not present

## 2023-05-11 DIAGNOSIS — I700 Atherosclerosis of aorta: Secondary | ICD-10-CM | POA: Diagnosis not present

## 2023-05-19 DIAGNOSIS — I110 Hypertensive heart disease with heart failure: Secondary | ICD-10-CM | POA: Diagnosis not present

## 2023-05-19 DIAGNOSIS — I5030 Unspecified diastolic (congestive) heart failure: Secondary | ICD-10-CM | POA: Diagnosis not present

## 2023-05-19 DIAGNOSIS — E1122 Type 2 diabetes mellitus with diabetic chronic kidney disease: Secondary | ICD-10-CM | POA: Diagnosis not present

## 2023-05-19 DIAGNOSIS — J9601 Acute respiratory failure with hypoxia: Secondary | ICD-10-CM | POA: Diagnosis not present

## 2023-05-23 DIAGNOSIS — J449 Chronic obstructive pulmonary disease, unspecified: Secondary | ICD-10-CM | POA: Diagnosis not present

## 2023-06-18 ENCOUNTER — Other Ambulatory Visit: Payer: Self-pay | Admitting: Gastroenterology

## 2023-06-18 DIAGNOSIS — K219 Gastro-esophageal reflux disease without esophagitis: Secondary | ICD-10-CM

## 2023-06-20 DIAGNOSIS — J449 Chronic obstructive pulmonary disease, unspecified: Secondary | ICD-10-CM | POA: Diagnosis not present

## 2023-06-23 ENCOUNTER — Other Ambulatory Visit: Payer: Self-pay | Admitting: Gastroenterology

## 2023-06-23 DIAGNOSIS — K219 Gastro-esophageal reflux disease without esophagitis: Secondary | ICD-10-CM

## 2023-07-01 ENCOUNTER — Ambulatory Visit (HOSPITAL_COMMUNITY): Payer: PPO

## 2023-07-01 ENCOUNTER — Encounter (HOSPITAL_COMMUNITY): Payer: PPO

## 2023-07-03 ENCOUNTER — Ambulatory Visit (HOSPITAL_COMMUNITY)
Admission: RE | Admit: 2023-07-03 | Discharge: 2023-07-03 | Disposition: A | Discharge status: Home / Self Care | Source: Ambulatory Visit | Attending: Family Medicine | Admitting: Family Medicine

## 2023-07-03 ENCOUNTER — Other Ambulatory Visit (HOSPITAL_COMMUNITY): Payer: Self-pay | Admitting: Family Medicine

## 2023-07-03 DIAGNOSIS — R921 Mammographic calcification found on diagnostic imaging of breast: Secondary | ICD-10-CM | POA: Insufficient documentation

## 2023-07-03 DIAGNOSIS — N6314 Unspecified lump in the right breast, lower inner quadrant: Secondary | ICD-10-CM | POA: Diagnosis not present

## 2023-07-03 DIAGNOSIS — N63 Unspecified lump in unspecified breast: Secondary | ICD-10-CM | POA: Insufficient documentation

## 2023-07-03 DIAGNOSIS — R92323 Mammographic fibroglandular density, bilateral breasts: Secondary | ICD-10-CM | POA: Diagnosis not present

## 2023-07-03 DIAGNOSIS — R928 Other abnormal and inconclusive findings on diagnostic imaging of breast: Secondary | ICD-10-CM

## 2023-07-07 ENCOUNTER — Other Ambulatory Visit (HOSPITAL_COMMUNITY): Payer: Self-pay | Admitting: Family Medicine

## 2023-07-07 DIAGNOSIS — R928 Other abnormal and inconclusive findings on diagnostic imaging of breast: Secondary | ICD-10-CM

## 2023-07-08 ENCOUNTER — Ambulatory Visit (HOSPITAL_COMMUNITY)
Admission: RE | Admit: 2023-07-08 | Discharge: 2023-07-08 | Discharge status: Home / Self Care | Source: Ambulatory Visit | Attending: Family Medicine | Admitting: Family Medicine

## 2023-07-08 ENCOUNTER — Encounter (HOSPITAL_COMMUNITY): Payer: Self-pay

## 2023-07-08 ENCOUNTER — Ambulatory Visit (HOSPITAL_COMMUNITY)
Admission: RE | Admit: 2023-07-08 | Discharge: 2023-07-08 | Disposition: A | Discharge status: Home / Self Care | Source: Ambulatory Visit | Attending: Family Medicine | Admitting: Family Medicine

## 2023-07-08 DIAGNOSIS — R928 Other abnormal and inconclusive findings on diagnostic imaging of breast: Secondary | ICD-10-CM | POA: Diagnosis not present

## 2023-07-08 DIAGNOSIS — C50311 Malignant neoplasm of lower-inner quadrant of right female breast: Secondary | ICD-10-CM | POA: Diagnosis not present

## 2023-07-08 HISTORY — PX: BREAST BIOPSY: SHX20

## 2023-07-08 MED ORDER — LIDOCAINE-EPINEPHRINE (PF) 1 %-1:200000 IJ SOLN
10.0000 mL | Freq: Once | INTRAMUSCULAR | Status: AC
  Administered 2023-07-08: 10 mL via INTRADERMAL

## 2023-07-08 MED ORDER — LIDOCAINE HCL (PF) 2 % IJ SOLN
10.0000 mL | Freq: Once | INTRAMUSCULAR | Status: AC
  Administered 2023-07-08: 10 mL

## 2023-07-08 NOTE — Progress Notes (Signed)
 PT tolerated right breast biopsy well today with NAD noted. PT verbalized understanding of discharge instructions. PT transported via wheelchair back to the mammogram area this time. Daughter in waiting area updated and given bag with discharge instructions and ice pack for patient to use.

## 2023-07-10 LAB — SURGICAL PATHOLOGY

## 2023-07-14 DIAGNOSIS — E6609 Other obesity due to excess calories: Secondary | ICD-10-CM | POA: Diagnosis not present

## 2023-07-14 DIAGNOSIS — Z6832 Body mass index (BMI) 32.0-32.9, adult: Secondary | ICD-10-CM | POA: Diagnosis not present

## 2023-07-14 DIAGNOSIS — F5101 Primary insomnia: Secondary | ICD-10-CM | POA: Diagnosis not present

## 2023-07-14 NOTE — Progress Notes (Signed)
 [facility] [address], [place] [address]   Clinic Day:  07/15/2023  Referring physician: Imaging, The Breast Cen*  Patient Care Team: Sandra Found, MD as PCP - General (Family Medicine) Nahser, Sandra Ping, MD as PCP - Cardiology (Cardiology) Sandra Massed, MD as Medical Oncologist (Medical Oncology) Sandra Sarah, RN as Oncology Nurse Navigator (Medical Oncology)   ASSESSMENT & PLAN:   Assessment:  1.  Stage I (T1c N0 M0 G2 ER/PR+ HER2-) right breast LIQ invasive mammary carcinoma: - CT angiogram chest (04/14/2023): Incidental 17 mm nodular area in the inferior right breast.  Stable mediastinal and right hilar adenopathy.  New nodular densities in the left lower lobe measuring up to 5 mm. - Diagnostic mammogram and ultrasound (07/03/2023): Breast mass measuring 1.9 x 1.4 x 1.9 cm in the right breast at 5 o'clock position.  Ultrasound right axilla with multiple normal-appearing lymph nodes. - Pathology: Invasive mammary carcinoma with extracellular mucin, overall grade 2, HER2 (0), ER 100% strong positive, PR 80% moderate positive, Ki-67 5% - She had history of cervical cancer, s/p TAH and her early 30s  2. Social/Family History: -Lives at home alone, ambulates with walker. Independent of ADL's and is able to cook independently. She relies on her daughter for other IADL's.  No tobacco use.  Reports 2-3 falls in the last 1 and half years. -Sister had bone cancer. Brother had leukemia. Other brother had cancer, type unknown.   Plan:  1.  Stage I (T1c N0 M0 G2 ER/PR+ HER2-) right breast LIQ invasive mammary carcinoma: - We reviewed findings on imaging as well as pathology reports. - Recommend surgery evaluation for lumpectomy.  I will start her on anastrozole 1 mg daily.  If she is not a surgical candidate, we will continue anastrozole as long as she continues to respond.  If no surgery, consider repeating mammogram/ultrasound in 6 months. - RTC 4 weeks  for follow-up.  2.  Macrocytic anemia: - Differential includes anemia from CKD/early MDS. - Will repeat CBC today, check B12, folic acid, ferritin, iron panel, MMA, copper, LDH, reticulocyte's, direct antiglobulin test.  3.  Osteopenia: - Previous DEXA scan on 10/28/2016 showed T-score -2.2.  Will obtain another baseline DEXA scan.  Will also check vitamin D level.   Orders Placed This Encounter  Procedures   DG Bone Density    Standing Status:   Future    Expected Date:   07/16/2023    Expiration Date:   07/14/2024    Reason for Exam (SYMPTOM  OR DIAGNOSIS REQUIRED):   AI therapy evaluation    Preferred imaging location?:   [facility]    Release to patient:   Immediate   CBC with Differential    Standing Status:   Future    Number of Occurrences:   1    Expected Date:   07/15/2023    Expiration Date:   07/14/2024   Lactate dehydrogenase    Standing Status:   Future    Number of Occurrences:   1    Expected Date:   07/15/2023    Expiration Date:   07/14/2024   Reticulocytes    Standing Status:   Future    Number of Occurrences:   1    Expected Date:   07/15/2023    Expiration Date:   07/14/2024   Vitamin D 25 hydroxy    Standing Status:   Future    Number of Occurrences:   1  Expected Date:   07/15/2023    Expiration Date:   07/14/2024   Ferritin    Standing Status:   Future    Number of Occurrences:   1    Expected Date:   07/15/2023    Expiration Date:   07/14/2024   Iron and TIBC ([facility] ONLY)    Standing Status:   Future    Number of Occurrences:   1    Expected Date:   07/15/2023    Expiration Date:   07/14/2024   Methylmalonic acid, serum    Standing Status:   Future    Number of Occurrences:   1    Expected Date:   07/15/2023    Expiration Date:   07/14/2024   Vitamin B12    Standing Status:   Future    Number of Occurrences:   1    Expected Date:   07/15/2023    Expiration Date:   07/14/2024   Folate    Standing Status:   Future    Number of  Occurrences:   1    Expected Date:   07/15/2023    Expiration Date:   07/14/2024   Copper, serum    Standing Status:   Future    Number of Occurrences:   1    Expected Date:   07/15/2023    Expiration Date:   07/14/2024   Ambulatory referral to General Surgery    Referral Priority:   Routine    Referral Type:   Surgical    Referral Reason:   Specialty Services Required    Requested Specialty:   General Surgery    Number of Visits Requested:   1   Direct antiglobulin test    Standing Status:   Future    Number of Occurrences:   1    Expected Date:   07/15/2023    Expiration Date:   07/14/2024      Sandra Cuevas,acting as a scribe for Sandra Massed, MD.,have documented all relevant documentation on the behalf of Sandra Massed, MD,as directed by  Sandra Massed, MD while in the presence of Sandra Massed, MD.   I, Sandra Massed MD, have reviewed the above documentation for accuracy and completeness, and I agree with the above.   Sandra Massed, MD   4/8/202510:28 AM  CHIEF COMPLAINT/PURPOSE OF CONSULT:   Diagnosis: ER+/PR+ right breast cancer   Cancer Staging  Breast cancer of lower-inner quadrant of right female breast [facility]) Staging form: Breast, AJCC 8th Edition - Clinical stage from 07/15/2023: Stage IA (cT1c, cN0, cM0, G2, ER+, PR+, HER2-) - Unsigned    Prior Therapy: None  Current Therapy: Anastrozole   HISTORY OF PRESENT ILLNESS:   Oncology History   No history exists.      Sandra Cuevas is a 89 y.o. female presenting to clinic today for evaluation of ER+/PR+ right breast cancer at the request of Sandra Cap, MD.  Patient has a past medical history of CAD, hypertension, hypothyroidism, DM type II, CKD, hyperlipidemia, and cervical cancer.   Sandra Cuevas was hospitalized from 04/15/23 to 04/22/23 for acute hypoxic respiratory failure with multifocal pneumonia. CT chest incidentally Cuevas a left lower lobe nodular densities up to 5 mm in size and a  17 mm nodular area in the inferior right breast. She then had a bilateral diagnostic mammogram and [place] on 07/03/23 that showed: a suspicious mass in the right breast at 5 o'clock measuring 1.9 cm by ultrasound. A span of 5-6 cm of  indeterminate calcifications in the medial right breast. No evidence of right axillary lymphadenopathy. No evidence of left breast malignancy.  Damiya underwent right breast biopsy at the 5 o'clock position on 07/08/23 with pathology revealing: invasive mammary carcinoma with extracellular mucin. The biopsy was Her2 negative (0), ER + (100%), and PR + (80%).   Today, she states that she is doing well overall. Her appetite level is at 100%. Her energy level is at 75%. She is accompanied by her daughter and son.   Rebbie has been on at home around the clock oxygen since January hospitalization for pneumonia. She notes decreased vision.   She has not discussed surgical treatment option yet with surgery, nor does she have an appointment. Dalonda has no prior history of breast biopsy. Menarche at age [age]. She had a hysterectomy due to cervical cancer in her 30's that caused menopause. She has 2 children, with her first childbirth at age 26. No history of hormone replacement or birth control pills. Her daughter notes that Yeily felt a burning sensation in the right breast that began 2 days ago. She denies any new onset pains, other than right breast soreness she attributes to biopsy. She denies any history of MI's, TIA's, or CVA's. Her ankles occasionally swell, which is resolved with fluid pills.   She has somewhat frequent falls, with her last fall 1 week ago that was due to sliding down her bedside.  Her son states she typically falls 2-3 times a year. She denies any injuries from falls. Eartha uses a walker at home.   Her last surgery was a right shoulder replacement 12-15 years ago. Father had cardiology issues. Mother had kidney issues.   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 "   COPD (chronic obstructive pulmonary disease) (HCC)    Diabetes mellitus    GERD (gastroesophageal reflux disease)    History of cardiovascular stress test 06/01/2010   EF 71% - no evidence of ischemia, normal left ventricular systolic function   History of hysterectomy 1965   Hyperlipidemia    Hypertension    Hypothyroidism    Shortness of breath    on exertion   Tubular adenoma of colon     Surgical History: Past Surgical History:  Procedure Laterality Date   ABDOMINAL HYSTERECTOMY     BREAST BIOPSY Right 07/08/2023   path pending   BREAST BIOPSY Right 07/08/2023   [place] RT BREAST BX W LOC DEV 1ST LESION IMG BX SPEC US GUIDE 07/08/2023 Sandra Cap, MD AP-ULTRASOUND   CARDIAC SURGERY     catherization   HEMORRHOID SURGERY     TOTAL SHOULDER REPLACEMENT Right     Social History: Social History   Socioeconomic History   Marital status: Widowed    Spouse name: Not on file   Number of children: 2   Years of education: 8   Highest education level: Not on file  Occupational History   Occupation: Retired  Tobacco Use   Smoking status: Never   Smokeless tobacco: Never  Vaping Use   Vaping status: Never Used  Substance and Sexual Activity   Alcohol use: No   Drug use: Never   Sexual activity: Not Currently    Birth control/protection: Surgical    Comment: hyst  Other Topics Concern  Not on file  Social History Narrative   Right handed   Tea daily   Lives alone   Social Drivers of Health   Financial Resource Strain: Low Risk  (07/13/2020)   Overall Financial Resource Strain (CARDIA)    Difficulty of Paying Living Expenses: Not very hard  Food Insecurity: No Food Insecurity (04/15/2023)   Hunger Vital Sign    Worried About Running Out of Food in the Last Year: Never true    Ran Out of Food in the Last Year: Never  true  Transportation Needs: No Transportation Needs (04/15/2023)   PRAPARE - Administrator, Civil Service (Medical): No    Lack of Transportation (Non-Medical): No  Physical Activity: Inactive (07/13/2020)   Exercise Vital Sign    Days of Exercise per Week: 0 days    Minutes of Exercise per Session: 0 min  Stress: No Stress Concern Present (07/13/2020)   [employer] of Occupational Health - Occupational Stress Questionnaire    Feeling of Stress : Only a little  Social Connections: Moderately Integrated (04/15/2023)   Social Connection and Isolation Panel [NHANES]    Frequency of Communication with Friends and Family: More than three times a week    Frequency of Social Gatherings with Friends and Family: More than three times a week    Attends Religious Services: More than 4 times per year    Active Member of [employer] or Organizations: Yes    Attends Banker Meetings: More than 4 times per year    Marital Status: Widowed  Intimate Partner Violence: Not At Risk (04/15/2023)   Humiliation, Afraid, Rape, and Kick questionnaire    Fear of Current or Ex-Partner: No    Emotionally Abused: No    Physically Abused: No    Sexually Abused: No    Family History: Family History  Problem Relation Age of Onset   Heart disease Father        heart problems   Kidney failure Mother    Diabetes Mother    Leukemia Brother    Diabetes Brother    Colon cancer Sister    Leukemia Brother    Diabetes Brother    Pancreatic cancer Brother    Bone cancer Sister     Current Medications:  Current Outpatient Medications:    acetaminophen (TYLENOL) 500 MG tablet, Take 2 tablets (1,000 mg total) by mouth every 6 (six) hours as needed., Disp: 30 tablet, Rfl: 0   albuterol (PROVENTIL) (2.5 MG/3ML) 0.083% nebulizer solution, Take 3 mLs (2.5 mg total) by nebulization every 6 (six) hours as needed for wheezing or shortness of breath., Disp: 75 mL, Rfl: 0   amLODipine (NORVASC) 10 MG  tablet, Take 1 tablet (10 mg total) by mouth daily., Disp: 30 tablet, Rfl: 0   anastrozole (ARIMIDEX) 1 MG tablet, Take 1 tablet (1 mg total) by mouth daily., Disp: 90 tablet, Rfl: 1   atorvastatin (LIPITOR) 40 MG tablet, TAKE 1 TABLET(40 MG) BY MOUTH DAILY AT 6 PM, Disp: 90 tablet, Rfl: 3   famotidine (PEPCID) 20 MG tablet, Take 1 tablet (20 mg total) by mouth 2 (two) times daily. (Patient taking differently: Take 20 mg by mouth daily.), Disp: 180 tablet, Rfl: 1   fluticasone (FLONASE) 50 MCG/ACT nasal spray, SHAKE LIQUID AND USE 2 SPRAYS IN EACH NOSTRIL DAILY, Disp: 16 g, Rfl: 5   furosemide (LASIX) 20 MG tablet, Take 20 mg by mouth daily., Disp: , Rfl:    gabapentin (NEURONTIN) 600  MG tablet, Take 600 mg by mouth 3 (three) times daily., Disp: , Rfl:    levothyroxine (SYNTHROID, LEVOTHROID) 50 MCG tablet, Take 50 mcg by mouth daily., Disp: , Rfl:    montelukast (SINGULAIR) 10 MG tablet, Take 10 mg by mouth daily., Disp: , Rfl:    Multiple Vitamins-Minerals (PRESERVISION AREDS 2+MULTI VIT PO), Take 1 tablet by mouth daily., Disp: , Rfl:    omeprazole (PRILOSEC) 20 MG capsule, TAKE 1 CAPSULE(20 MG) BY MOUTH DAILY, Disp: 90 capsule, Rfl: 3   potassium chloride (KLOR-CON) 10 MEQ tablet, Take 10 mEq by mouth daily., Disp: , Rfl:    zolpidem (AMBIEN) 5 MG tablet, Take 0.5 tablets (2.5 mg total) by mouth at bedtime as needed for sleep., Disp: , Rfl:    Allergies: No Known Allergies  REVIEW OF SYSTEMS:   Review of Systems  Constitutional:  Negative for chills, fatigue and fever.  HENT:   Negative for lump/mass, mouth sores, nosebleeds, sore throat and trouble swallowing.   Eyes:  Negative for eye problems.  Respiratory:  Positive for shortness of breath. Negative for cough.   Cardiovascular:  Negative for chest pain, leg swelling and palpitations.  Gastrointestinal:  Negative for abdominal pain, constipation, diarrhea, nausea and vomiting.  Genitourinary:  Negative for bladder incontinence,  difficulty urinating, dysuria, frequency, hematuria and nocturia.   Musculoskeletal:  Negative for arthralgias, back pain, flank pain, myalgias and neck pain.       +right breast pain, 5/10 severity  Skin:  Negative for itching and rash.  Neurological:  Positive for headaches. Negative for dizziness and numbness.  Hematological:  Does not bruise/bleed easily.  Psychiatric/Behavioral:  Positive for sleep disturbance. Negative for depression and suicidal ideas. The patient is not nervous/anxious.   All other systems reviewed and are negative.    VITALS:   Blood pressure (!) 171/50, pulse 64, temperature (!) 97 F (36.1 C), temperature source Tympanic, resp. rate 20, height 5' 2.99" (1.6 m), weight 205 lb 0.4 oz (93 kg), SpO2 98%.  Wt Readings from Last 3 Encounters:  07/15/23 205 lb 0.4 oz (93 kg)  04/22/23 208 lb 12.4 oz (94.7 kg)  12/05/22 197 lb 9.6 oz (89.6 kg)    Body mass index is 36.33 kg/m.  Performance status (ECOG): 2 - Symptomatic, <50% confined to bed  PHYSICAL EXAM:   Physical Exam Vitals and nursing note reviewed. Exam conducted with a chaperone present.  Constitutional:      Appearance: Normal appearance.  Cardiovascular:     Rate and Rhythm: Normal rate and regular rhythm.     Pulses: Normal pulses.     Heart sounds: Normal heart sounds.  Pulmonary:     Effort: Pulmonary effort is normal.     Breath sounds: Normal breath sounds.  Chest:     Comments: +vague mass felt in lower inner quadrant of right breast, exam limited by tenderness +no other masses palpable +no adenopathy Abdominal:     Palpations: Abdomen is soft. There is no hepatomegaly, splenomegaly or mass.     Tenderness: There is no abdominal tenderness.  Musculoskeletal:     Right lower leg: No edema.     Left lower leg: No edema.  Lymphadenopathy:     Cervical: No cervical adenopathy.     Right cervical: No superficial, deep or posterior cervical adenopathy.    Left cervical: No superficial,  deep or posterior cervical adenopathy.     Upper Body:     Right upper body: No supraclavicular or axillary  adenopathy.     Left upper body: No supraclavicular or axillary adenopathy.  Neurological:     General: No focal deficit present.     Mental Status: She is alert and oriented to person, place, and time.  Psychiatric:        Mood and Affect: Mood normal.        Behavior: Behavior normal.   Breast Exam Chaperone: Kennith Gain, RN   LABS:   CBC    Component Value Date/Time   WBC 8.3 07/15/2023 0851   RBC 3.00 (L) 07/15/2023 0851   RBC 2.98 (L) 07/15/2023 0851   HGB 9.2 (L) 07/15/2023 0851   HCT 30.8 (L) 07/15/2023 0851   PLT 153 07/15/2023 0851   MCV 102.7 (H) 07/15/2023 0851   MCH 30.7 07/15/2023 0851   MCHC 29.9 (L) 07/15/2023 0851   RDW 14.6 07/15/2023 0851   LYMPHSABS 1.3 07/15/2023 0851   MONOABS 1.2 (H) 07/15/2023 0851   EOSABS 0.2 07/15/2023 0851   BASOSABS 0.0 07/15/2023 0851    CMP    Component Value Date/Time   NA 138 04/22/2023 0502   NA 143 12/05/2022 1038   K 4.8 04/22/2023 0502   CL 97 (L) 04/22/2023 0502   CO2 36 (H) 04/22/2023 0502   GLUCOSE 162 (H) 04/22/2023 0502   BUN 32 (H) 04/22/2023 0502   BUN 14 12/05/2022 1038   CREATININE 1.03 (H) 04/22/2023 0502   CREATININE 1.22 (H) 04/18/2015 1022   CALCIUM 8.9 04/22/2023 0502   PROT 5.6 (L) 04/22/2023 0502   PROT 6.2 12/21/2021 0805   ALBUMIN 2.6 (L) 04/22/2023 0502   ALBUMIN 3.8 12/21/2021 0805   AST 29 04/22/2023 0502   ALT 28 04/22/2023 0502   ALKPHOS 61 04/22/2023 0502   BILITOT 0.5 04/22/2023 0502   BILITOT 0.5 12/21/2021 0805   GFRNONAA 51 (L) 04/22/2023 0502   GFRAA 48 (L) 08/12/2019 0253     No results Cuevas for: "CEA1", "CEA" / No results Cuevas for: "CEA1", "CEA" No results Cuevas for: "PSA1" No results Cuevas for: "HYQ657" No results Cuevas for: "CAN125"  No results Cuevas for: "TOTALPROTELP", "ALBUMINELP", "A1GS", "A2GS", "BETS", "BETA2SER", "GAMS", "MSPIKE", "SPEI" Lab  Results  Component Value Date   TIBC 273 07/15/2023   TIBC 226 (L) 09/22/2009   FERRITIN 193 07/15/2023   FERRITIN 309 (H) 09/22/2009   IRONPCTSAT 17 07/15/2023   IRONPCTSAT 12 (L) 09/22/2009   Lab Results  Component Value Date   LDH 133 07/15/2023     STUDIES:   [place] RT BREAST BX W LOC DEV 1ST LESION IMG BX SPEC US GUIDE Addendum Date: 07/09/2023 ADDENDUM REPORT: 07/09/2023 14:06 ADDENDUM: PATHOLOGY revealed: A. BREAST, RIGHT 5 OC, NEEDLE CORE BIOPSY: - Invasive mammary carcinoma with extracellular mucin - Overall Grade: 2 - Lymphovascular invasion: Not identified - Cancer Length: 1.1 cm - Calcifications: Not identified. Pathology results are CONCORDANT with imaging findings, per Dr. Edwin Cuevas. Pathology results and recommendations were discussed with patient via telephone on 07/09/2023 by Randa Lynn RN. Patient reported biopsy site doing well with no adverse symptoms, and only slight tenderness at the site. Post biopsy care instructions were reviewed, questions were answered and my direct phone number was provided. Patient was instructed to call [facility] Mammography Department for any additional questions or concerns related to biopsy site. RECOMMENDATIONS: 1. Surgical and oncological consultation. Oncological consultation relayed to Nance Pew at oncology office of Dr. Doreatha Cuevas; Duwayne Heck will call patient's daughter Rene Kocher) with appointment details.  Given patient's advanced age, surgical consult will be made after oncology appointment if surgical intervention is desired. Pathology results reported by Randa Lynn RN on 07/09/2023. Electronically Signed   By: Sandra Cuevas M.D.   On: 07/09/2023 14:06   Result Date: 07/09/2023 CLINICAL DATA:  Patient presents for ultrasound-guided core biopsy of a 1.9 cm mass in the right breast at the 5 o'clock position. EXAM: ULTRASOUND GUIDED RIGHT BREAST CORE NEEDLE BIOPSY COMPARISON:  Previous exam(s). PROCEDURE: I  met with the patient and we discussed the procedure of ultrasound-guided biopsy, including benefits and alternatives. We discussed the high likelihood of a successful procedure. We discussed the risks of the procedure, including infection, bleeding, tissue injury, clip migration, and inadequate sampling. Informed written consent was given. The usual time-out protocol was performed immediately prior to the procedure. Lesion quadrant: Lower inner Using sterile technique and 1% Lidocaine as local anesthetic, under direct ultrasound visualization, a 14 gauge spring-loaded device was used to perform biopsy of the mass in the right breast at the 5 o'clock position using a medial to lateral approach. At the conclusion of the procedure a ribbon shaped tissue marker clip was deployed into the biopsy cavity. Follow up 2 view mammogram was performed and dictated separately. IMPRESSION: Ultrasound guided biopsy of the mass in the right breast at the 5 o'clock position. No apparent complications. Electronically Signed: By: Sandra Cuevas M.D. On: 07/08/2023 13:39   MM CLIP PLACEMENT RIGHT Result Date: 07/08/2023 CLINICAL DATA:  Post ultrasound-guided core biopsy of a mass in the right breast at the 5 o'clock position. EXAM: 3D DIAGNOSTIC RIGHT MAMMOGRAM POST ULTRASOUND BIOPSY COMPARISON:  Previous exam(s). ACR Breast Density Category b: There are scattered areas of fibroglandular density. FINDINGS: 3D Mammographic images were obtained following ultrasound-guided core biopsy of a mass in the right breast at the 5 o'clock position. A ribbon shaped biopsy marking clip is present at the site of the biopsied mass in the right breast at the 5 o'clock position. IMPRESSION: Ribbon shaped biopsy marking clip at site of biopsied mass in the right breast at the 5 o'clock position. Final Assessment: Post Procedure Mammograms for Marker Placement Electronically Signed   By: Sandra Cuevas M.D.   On: 07/08/2023 13:44   MM 3D  DIAGNOSTIC MAMMOGRAM BILATERAL BREAST Result Date: 07/03/2023 CLINICAL DATA:  89 year old female presenting for evaluation of a mass in the right breast seen on a chest CT from 04/14/2023. EXAM: DIGITAL DIAGNOSTIC BILATERAL MAMMOGRAM WITH TOMOSYNTHESIS AND CAD; ULTRASOUND RIGHT BREAST LIMITED TECHNIQUE: Bilateral digital diagnostic mammography and breast tomosynthesis was performed. The images were evaluated with computer-aided detection. ; Targeted ultrasound examination of the right breast was performed COMPARISON:  Previous exam(s). ACR Breast Density Category b: There are scattered areas of fibroglandular density. FINDINGS: In the inferior far posterior right breast, there is an oval mass measuring approximately 2 cm. In the medial mid depth of right breast, there are coarse linear calcifications in a segmental orientation spanning 5-6 cm. No suspicious calcifications, masses or areas of distortion are seen in the left breast. Ultrasound of the right breast at 5 o'clock, 5 cm from the nipple demonstrates a hypoechoic oval circumscribed vascular mass with a few angular margins. The mass measures 1.9 x 1.4 x 1.9 cm. Ultrasound of the right axilla demonstrates multiple normal-appearing lymph nodes. IMPRESSION: 1. There is a suspicious mass in the right breast at 5 o'clock measuring 1.9 cm by ultrasound. 2. There is a span of 5-6 cm of indeterminate calcifications in the  medial right breast. 3.  No evidence of right axillary lymphadenopathy. 4.  No evidence of left breast malignancy. RECOMMENDATION: 1. Ultrasound guided biopsy is recommended for the right breast mass at 5 o'clock. The procedure has been scheduled for 07/08/2023 at 1 p.m. 2. If the mass is malignant, the calcifications may be biopsied under stereotactic guidance if this will influence the treatment plan. I have discussed the findings and recommendations with the patient. If applicable, a reminder letter will be sent to the patient regarding the next  appointment. BI-RADS CATEGORY  5: Highly suggestive of malignancy. Electronically Signed   By: Frederico Hamman M.D.   On: 07/03/2023 11:42   [place] LIMITED ULTRASOUND INCLUDING AXILLA RIGHT BREAST Result Date: 07/03/2023 CLINICAL DATA:  89 year old female presenting for evaluation of a mass in the right breast seen on a chest CT from 04/14/2023. EXAM: DIGITAL DIAGNOSTIC BILATERAL MAMMOGRAM WITH TOMOSYNTHESIS AND CAD; ULTRASOUND RIGHT BREAST LIMITED TECHNIQUE: Bilateral digital diagnostic mammography and breast tomosynthesis was performed. The images were evaluated with computer-aided detection. ; Targeted ultrasound examination of the right breast was performed COMPARISON:  Previous exam(s). ACR Breast Density Category b: There are scattered areas of fibroglandular density. FINDINGS: In the inferior far posterior right breast, there is an oval mass measuring approximately 2 cm. In the medial mid depth of right breast, there are coarse linear calcifications in a segmental orientation spanning 5-6 cm. No suspicious calcifications, masses or areas of distortion are seen in the left breast. Ultrasound of the right breast at 5 o'clock, 5 cm from the nipple demonstrates a hypoechoic oval circumscribed vascular mass with a few angular margins. The mass measures 1.9 x 1.4 x 1.9 cm. Ultrasound of the right axilla demonstrates multiple normal-appearing lymph nodes. IMPRESSION: 1. There is a suspicious mass in the right breast at 5 o'clock measuring 1.9 cm by ultrasound. 2. There is a span of 5-6 cm of indeterminate calcifications in the medial right breast. 3.  No evidence of right axillary lymphadenopathy. 4.  No evidence of left breast malignancy. RECOMMENDATION: 1. Ultrasound guided biopsy is recommended for the right breast mass at 5 o'clock. The procedure has been scheduled for 07/08/2023 at 1 p.m. 2. If the mass is malignant, the calcifications may be biopsied under stereotactic guidance if this will influence the  treatment plan. I have discussed the findings and recommendations with the patient. If applicable, a reminder letter will be sent to the patient regarding the next appointment. BI-RADS CATEGORY  5: Highly suggestive of malignancy. Electronically Signed   By: Frederico Hamman M.D.   On: 07/03/2023 11:42

## 2023-07-15 ENCOUNTER — Encounter: Payer: Self-pay | Admitting: Hematology

## 2023-07-15 ENCOUNTER — Inpatient Hospital Stay

## 2023-07-15 ENCOUNTER — Inpatient Hospital Stay: Attending: Hematology | Admitting: Hematology

## 2023-07-15 VITALS — BP 171/50 | HR 64 | Temp 97.0°F | Resp 20 | Ht 62.99 in | Wt 205.0 lb

## 2023-07-15 DIAGNOSIS — Z808 Family history of malignant neoplasm of other organs or systems: Secondary | ICD-10-CM | POA: Diagnosis not present

## 2023-07-15 DIAGNOSIS — Z170 Estrogen receptor positive status [ER+]: Secondary | ICD-10-CM | POA: Diagnosis not present

## 2023-07-15 DIAGNOSIS — Z800 Family history of malignant neoplasm of digestive organs: Secondary | ICD-10-CM | POA: Diagnosis not present

## 2023-07-15 DIAGNOSIS — D539 Nutritional anemia, unspecified: Secondary | ICD-10-CM | POA: Diagnosis not present

## 2023-07-15 DIAGNOSIS — Z90710 Acquired absence of both cervix and uterus: Secondary | ICD-10-CM | POA: Diagnosis not present

## 2023-07-15 DIAGNOSIS — Z1721 Progesterone receptor positive status: Secondary | ICD-10-CM | POA: Diagnosis not present

## 2023-07-15 DIAGNOSIS — Z8541 Personal history of malignant neoplasm of cervix uteri: Secondary | ICD-10-CM

## 2023-07-15 DIAGNOSIS — Z79811 Long term (current) use of aromatase inhibitors: Secondary | ICD-10-CM | POA: Diagnosis not present

## 2023-07-15 DIAGNOSIS — C50311 Malignant neoplasm of lower-inner quadrant of right female breast: Secondary | ICD-10-CM

## 2023-07-15 DIAGNOSIS — Z806 Family history of leukemia: Secondary | ICD-10-CM | POA: Diagnosis not present

## 2023-07-15 DIAGNOSIS — Z1732 Human epidermal growth factor receptor 2 negative status: Secondary | ICD-10-CM | POA: Diagnosis not present

## 2023-07-15 DIAGNOSIS — M8580 Other specified disorders of bone density and structure, unspecified site: Secondary | ICD-10-CM | POA: Diagnosis not present

## 2023-07-15 LAB — CBC WITH DIFFERENTIAL/PLATELET
Abs Immature Granulocytes: 0.04 10*3/uL (ref 0.00–0.07)
Basophils Absolute: 0 10*3/uL (ref 0.0–0.1)
Basophils Relative: 0 %
Eosinophils Absolute: 0.2 10*3/uL (ref 0.0–0.5)
Eosinophils Relative: 3 %
HCT: 30.8 % — ABNORMAL LOW (ref 36.0–46.0)
Hemoglobin: 9.2 g/dL — ABNORMAL LOW (ref 12.0–15.0)
Immature Granulocytes: 1 %
Lymphocytes Relative: 15 %
Lymphs Abs: 1.3 10*3/uL (ref 0.7–4.0)
MCH: 30.7 pg (ref 26.0–34.0)
MCHC: 29.9 g/dL — ABNORMAL LOW (ref 30.0–36.0)
MCV: 102.7 fL — ABNORMAL HIGH (ref 80.0–100.0)
Monocytes Absolute: 1.2 10*3/uL — ABNORMAL HIGH (ref 0.1–1.0)
Monocytes Relative: 15 %
Neutro Abs: 5.5 10*3/uL (ref 1.7–7.7)
Neutrophils Relative %: 66 %
Platelets: 153 10*3/uL (ref 150–400)
RBC: 3 MIL/uL — ABNORMAL LOW (ref 3.87–5.11)
RDW: 14.6 % (ref 11.5–15.5)
WBC: 8.3 10*3/uL (ref 4.0–10.5)
nRBC: 0 % (ref 0.0–0.2)

## 2023-07-15 LAB — FOLATE: Folate: >40 ng/mL (ref >5.9)

## 2023-07-15 LAB — DIRECT ANTIGLOBULIN TEST (NOT AT ARMC)
DAT, IgG: NEGATIVE
DAT, complement: NEGATIVE

## 2023-07-15 LAB — IRON AND TIBC
Iron: 45 ug/dL (ref 28–170)
Saturation Ratios: 17 % (ref 10.4–31.8)
TIBC: 273 ug/dL (ref 250–450)
UIBC: 228 ug/dL

## 2023-07-15 LAB — FERRITIN: Ferritin: 193 ng/mL (ref 11–307)

## 2023-07-15 LAB — VITAMIN B12: Vitamin B-12: 375 pg/mL (ref 180–914)

## 2023-07-15 LAB — RETICULOCYTES
Immature Retic Fract: 22.5 % — ABNORMAL HIGH (ref 2.3–15.9)
RBC.: 2.98 MIL/uL — ABNORMAL LOW (ref 3.87–5.11)
Retic Count, Absolute: 52.4 10*3/uL (ref 19.0–186.0)
Retic Ct Pct: 1.8 % (ref 0.4–3.1)

## 2023-07-15 LAB — LACTATE DEHYDROGENASE: LDH: 133 U/L (ref 98–192)

## 2023-07-15 LAB — VITAMIN D 25 HYDROXY (VIT D DEFICIENCY, FRACTURES): Vit D, 25-Hydroxy: 71.45 ng/mL (ref 30–100)

## 2023-07-15 MED ORDER — ANASTROZOLE 1 MG PO TABS: 1.0000 mg | ORAL_TABLET | Freq: Every day | ORAL | 1 refills | Status: DC

## 2023-07-15 NOTE — Patient Instructions (Addendum)
 [facility]  Discharge Instructions  You were seen and examined today by Dr. Ellin Saba. Dr. Ellin Saba is a medical oncologist, meaning that he specializes in the treatment of cancer diagnoses. Dr. Ellin Saba discussed your past medical history, family history of cancers, and the events that led to you being here today.  You were referred to Dr. Ellin Saba due to a new diagnosis of right breast cancer. It is a relatively slow growing type of breast cancer and appears to be currently in Stage I. The cancer is being fed by the hormones that occur naturally within the body, namely - estrogen.  The typical treatment for this type of treatment is surgery followed by at least 5 years of an anti-estrogen.  Dr. Ellin Saba has recommended an anti-estrogen pill known as anastrozole to slow the cancer growth. We will do this while you wait to see the surgeon and for them to decide if you are a surgical candidate.  Dr. Ellin Saba has also recommended additional labs today due to anemia.  Dr. Ellin Saba would also like for you to have an updated bone density scan to establish an updated baseline.  Follow-up as scheduled.  Thank you for choosing [facility] to provide your oncology and hematology care.   To afford each patient quality time with our provider, please arrive at least 15 minutes before your scheduled appointment time. You may need to reschedule your appointment if you arrive late (10 or more minutes). Arriving late affects you and other patients whose appointments are after yours.  Also, if you miss three or more appointments without notifying the office, you may be dismissed from the clinic at the provider's discretion.    Again, thank you for choosing [facility]  Our hope is that these requests will decrease the amount of time that you wait before being seen by our physicians.   If you have a lab appointment with the  Cancer Center - please note that after April 8th, all labs will be drawn in the cancer center.  You do not have to check in or register with the main entrance as you have in the past but will complete your check-in at the cancer center.            _____________________________________________________________  Should you have questions after your visit to [facility], please contact our office at [phone] and follow the prompts.  Our office hours are 8:00 a.m. to 4:30 p.m. Monday - Thursday and 8:00 a.m. to 2:30 p.m. Friday.  Please note that voicemails left after 4:00 p.m. may not be returned until the following business day.  We are closed weekends and all major holidays.  You do have access to a nurse 24-7, just call the main number to the clinic [phone] and do not press any options, hold on the line and a nurse will answer the phone.    For prescription refill requests, have your pharmacy contact our office and allow 72 hours.    Masks are no longer required in the cancer centers. If you would like for your care team to wear a mask while they are taking care of you, please let them know. You may have one support person who is at least 88 years old accompany you for your appointments.

## 2023-07-17 ENCOUNTER — Ambulatory Visit: Admitting: General Surgery

## 2023-07-17 ENCOUNTER — Encounter: Payer: Self-pay | Admitting: General Surgery

## 2023-07-17 VITALS — BP 164/71 | HR 76 | Temp 98.1°F | Resp 18 | Ht 63.0 in | Wt 206.0 lb

## 2023-07-17 DIAGNOSIS — C50311 Malignant neoplasm of lower-inner quadrant of right female breast: Secondary | ICD-10-CM | POA: Diagnosis not present

## 2023-07-17 DIAGNOSIS — Z170 Estrogen receptor positive status [ER+]: Secondary | ICD-10-CM | POA: Diagnosis not present

## 2023-07-18 LAB — COPPER, SERUM: Copper: 126 ug/dL (ref 80–158)

## 2023-07-18 LAB — METHYLMALONIC ACID, SERUM: Methylmalonic Acid, Quantitative: 393 nmol/L — ABNORMAL HIGH (ref 0–378)

## 2023-07-18 NOTE — Progress Notes (Signed)
 Sandra Cuevas; [id number]; 1928/08/14   HPI Patient is a 89 year old white female who was referred to my care by oncology for evaluation treatment of a right breast cancer.  This was found incidentally during her admission for pneumonia earlier this year.  A 1.9 cm mass was noted in the lower, inner quadrant of the right breast.  Biopsy was positive for invasive mammary carcinoma.  It is ER/PR positive.  She was also noted to have microcalcifications in that area that expanded approximately 5 cm.  This was not biopsied.  No lymphadenopathy was noted.  Patient was referred by oncology as she has requested possible surgical excision of the cancer with a lumpectomy.  Patient has been on 2 -3 liters of oxygen due to her pneumonia.  She states this is stable.  She does get short of breath when walking distances. 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 "   COPD (chronic obstructive pulmonary disease) (HCC)    Diabetes mellitus    GERD (gastroesophageal reflux disease)    History of cardiovascular stress test 06/01/2010   EF 71% - no evidence of ischemia, normal left ventricular systolic function   History of hysterectomy 1965   Hyperlipidemia    Hypertension    Hypothyroidism    Shortness of breath    on exertion   Tubular adenoma of colon     Past Surgical History:  Procedure Laterality Date   ABDOMINAL HYSTERECTOMY     BREAST BIOPSY Right 07/08/2023   path pending   BREAST BIOPSY Right 07/08/2023   [place] RT BREAST BX W LOC DEV 1ST LESION IMG BX SPEC US GUIDE 07/08/2023 Edwin Cap, MD [facility]   CARDIAC SURGERY     catherization   HEMORRHOID SURGERY     TOTAL SHOULDER REPLACEMENT Right     Family History  Problem Relation Age of Onset   Heart disease Father        heart problems   Kidney failure Mother    Diabetes Mother    Leukemia Brother    Diabetes Brother    Colon cancer  Sister    Leukemia Brother    Diabetes Brother    Pancreatic cancer Brother    Bone cancer Sister     Current Outpatient Medications on File Prior to Visit  Medication Sig Dispense Refill   acetaminophen (TYLENOL) 500 MG tablet Take 2 tablets (1,000 mg total) by mouth every 6 (six) hours as needed. 30 tablet 0   albuterol (PROVENTIL) (2.5 MG/3ML) 0.083% nebulizer solution Take 3 mLs (2.5 mg total) by nebulization every 6 (six) hours as needed for wheezing or shortness of breath. 75 mL 0   amLODipine (NORVASC) 10 MG tablet Take 1 tablet (10 mg total) by mouth daily. 30 tablet 0   anastrozole (ARIMIDEX) 1 MG tablet Take 1 tablet (1 mg total) by mouth daily. 90 tablet 1   atorvastatin (LIPITOR) 40 MG tablet TAKE 1 TABLET(40 MG) BY MOUTH DAILY AT 6 PM 90 tablet 3   famotidine (PEPCID) 20 MG tablet Take 1 tablet (20 mg total) by mouth 2 (two) times daily. (Patient taking differently: Take 20 mg by mouth daily.) 180 tablet 1   fluticasone (FLONASE) 50 MCG/ACT nasal spray SHAKE LIQUID AND USE 2 SPRAYS IN EACH NOSTRIL DAILY 16 g 5   furosemide (LASIX) 20 MG tablet Take  20 mg by mouth daily.     gabapentin (NEURONTIN) 600 MG tablet Take 600 mg by mouth 3 (three) times daily.     levothyroxine (SYNTHROID, LEVOTHROID) 50 MCG tablet Take 50 mcg by mouth daily.     montelukast (SINGULAIR) 10 MG tablet Take 10 mg by mouth daily.     Multiple Vitamins-Minerals (PRESERVISION AREDS 2+MULTI VIT PO) Take 1 tablet by mouth daily.     omeprazole (PRILOSEC) 20 MG capsule TAKE 1 CAPSULE(20 MG) BY MOUTH DAILY 90 capsule 3   potassium chloride (KLOR-CON) 10 MEQ tablet Take 10 mEq by mouth daily.     zolpidem (AMBIEN) 5 MG tablet Take 0.5 tablets (2.5 mg total) by mouth at bedtime as needed for sleep.     No current facility-administered medications on file prior to visit.    No Known Allergies  Social History   Substance and Sexual Activity  Alcohol Use No    Social History   Tobacco Use  Smoking  Status Never  Smokeless Tobacco Never    Review of Systems  Constitutional:  Positive for malaise/fatigue.  HENT: Negative.    Eyes: Negative.   Respiratory:  Positive for shortness of breath.   Cardiovascular: Negative.   Gastrointestinal: Negative.   Genitourinary: Negative.   Musculoskeletal:  Positive for back pain and joint pain.  Skin: Negative.   Neurological: Negative.   Endo/Heme/Allergies: Negative.   Psychiatric/Behavioral: Negative.      Objective   Vitals:   07/17/23 1044  BP: (!) 164/71  Pulse: 76  Resp: 18  Temp: 98.1 F (36.7 C)  SpO2: 90%    Physical Exam Vitals reviewed.  Constitutional:      Appearance: Normal appearance. She is not ill-appearing.  HENT:     Head: Normocephalic and atraumatic.  Neck:     Vascular: No carotid bruit.  Cardiovascular:     Rate and Rhythm: Normal rate and regular rhythm.     Heart sounds: Normal heart sounds. No murmur heard.    No friction rub. No gallop.  Pulmonary:     Effort: Pulmonary effort is normal. No respiratory distress.     Breath sounds: Normal breath sounds. No stridor. No wheezing, rhonchi or rales.     Comments: Patient is on 2L nasal cannula Musculoskeletal:     Cervical back: Neck supple.  Lymphadenopathy:     Cervical: No cervical adenopathy.  Skin:    General: Skin is warm and dry.  Neurological:     Mental Status: She is alert and oriented to person, place, and time.   Breast: Bruising is noted in the lower, inner quadrant of the right breast.  She does have pendulous breasts.  No other dominant mass, nipple discharge, or dimpling is noted.  Left breast examination is unremarkable.  Both axillas are negative for palpable nodes.  Mammogram and pathology reports were reviewed Dr. Marice Potter note reviewed 2D echo done earlier this year shows an ejection fraction within normal limits.  Assessment  Right breast cancer, 1.9 cm in size with stranding microcalcifications which were not  biopsied.  Patient is on home O2. Given her pendulous breasts and the possibility of malignancy within the microcalcifications that extend 5 cm, a simple mastectomy is warranted.  Patient understands and agrees.  She does not want to repeat biopsies of the microcalcifications.  I do not feel a lumpectomy would give her the best results given her breast configuration. Plan  Patient is scheduled for a right simple mastectomy on 07/23/2023.  She understands that she is at increased risk for postoperative intubation given her pulmonary status.  This appears to be stable at this time.  The risks and benefits of the procedure including bleeding, infection, the need for blood transfusion, and the possibility of worsening pulmonary status were fully explained to the patient, who gave informed consent.

## 2023-07-18 NOTE — Patient Instructions (Signed)
 Sandra Cuevas  07/18/2023     @[email or web address] @   Your procedure is scheduled on  07/23/2023.   Report to [facility] at  [phone]  A.M.   Call this number if you have problems the morning of surgery:  [phone]  If you experience any cold or flu symptoms such as cough, fever, chills, shortness of breath, etc. between now and your scheduled surgery, please notify us at the above number.   Remember:       Use your nebulizer and your inhaler before you come and bring your rescue inhaler with you.   Do not eat after midnight.   You may drink clear liquids until 0450 am on 07/23/2023.    Clear liquids allowed are:                    Water, Juice (No red color; non-citric and without pulp; diabetics please choose diet or no sugar options), Carbonated beverages (diabetics please choose diet or no sugar options), Clear Tea (No creamer, milk, or cream, including half & half and powdered creamer), Black Coffee Only (No creamer, milk or cream, including half & half and powdered creamer), and Clear Sports drink (No red color; diabetics please choose diet or no sugar options)    Take these medicines the morning of surgery with A SIP OF WATER      amlodipine, anastrozole, famotidine, gabapentin, levothyroxine, omeprazole.    Do not wear jewelry, make-up or nail polish, including gel polish,  artificial nails, or any other type of covering on natural nails (fingers and  toes).  Do not wear lotions, powders, or perfumes, or deodorant.  Do not shave 48 hours prior to surgery.  Men may shave face and neck.  Do not bring valuables to the hospital.  [facility] is not responsible for any belongings or valuables.  Contacts, dentures or bridgework may not be worn into surgery.  Leave your suitcase in the car.  After surgery it may be brought to your room.  For patients admitted to the hospital, discharge time will be determined by your treatment team.  Patients discharged the day of  surgery will not be allowed to drive home.    Special instructions:   DO NOT smoke tobacco or vape for 24 hours before your procedure.  Please read over the following fact sheets that you were given. Pain Booklet, Coughing and Deep Breathing, Blood Transfusion Information, Surgical Site Infection Prevention, Anesthesia Post-op Instructions, and Care and Recovery After Surgery         Lumpectomy, Care After The following information offers guidance on how to care for yourself after your procedure. Your health care provider may also give you more specific instructions. If you have problems or questions, contact your health care provider. What can I expect after the procedure? After the procedure, it is common to have: Some pain or redness at the incision site. Breast swelling. Breast tenderness. Stiffness in your arm or shoulder. A change in the shape and feel of your breast. Scar tissue that feels hard to the touch in the area where the lump was removed. Follow these instructions at home: Medicines Take over-the-counter and prescription medicines only as told by your health care provider. If you were prescribed an antibiotic, take it as told by your health care provider. Do not stop taking the antibiotic even if you start to feel better. Ask your health care provider if the medicine prescribed to you:  Requires you to avoid driving or using machinery. Can cause constipation. You may need to take these actions to prevent or treat constipation: Drink enough fluid to keep your urine pale yellow. Take over-the-counter or prescription medicines. Eat foods that are high in fiber, such as beans, whole grains, and fresh fruits and vegetables. Limit foods that are high in fat and processed sugars, such as fried or sweet foods. Incision care     Follow instructions from your health care provider about how to take care of your incision. Make sure you: Wash your hands with soap and water for  at least 20 seconds before and after you change your bandage (dressing). If soap and water are not available, use hand sanitizer. Change your dressing as told by your health care provider. Leave stitches (sutures), skin glue, or adhesive strips in place. These skin closures may need to stay in place for 2 weeks or longer. If adhesive strip edges start to loosen and curl up, you may trim the loose edges. Do not remove adhesive strips completely unless your health care provider tells you to do that. Check your incision area every day for signs of infection. Check for: More redness, swelling, or pain. Fluid or blood. Warmth. Pus or a bad smell. Keep your dressing clean and dry. If you were sent home with a surgical drain in place, follow instructions from your health care provider about emptying it. Bathing Do not take baths, swim, or use a hot tub until your health care provider approves. Ask your health care provider if you may take showers. You may only be allowed to take sponge baths. Activity Rest as told by your health care provider. Do not sit for a long time without moving. Get up to take short walks every 1-2 hours. This will improve blood flow and breathing. Ask for help if you feel weak or unsteady. Be careful to avoid any activities that could cause an injury to your arm on the side of your surgery. Do not lift anything that is heavier than 10 lb (4.5 kg), or the limit that you are told, until your health care provider says that it is safe. Avoid lifting with the arm that is on the side of your surgery. Do not carry heavy objects on your shoulder on the side of your surgery. Do exercises to keep your shoulder and arm from getting stiff and swollen. Talk with your health care provider about which exercises are safe for you. Return to your normal activities as told by your health care provider. Ask your health care provider what activities are safe for you. General instructions Wear a  supportive bra as told by your health care provider. Raise (elevate) your arm above the level of your heart while you are sitting or lying down. Do not wear tight jewelry on your arm, wrist, or fingers on the side of your surgery. Wear compression stockings as told by your health care provider. These stockings help to prevent blood clots and reduce swelling in your legs. If you had any lymph nodes removed during your procedure, be sure to tell all of your health care providers. It is important to share this information before you have certain procedures, such as blood tests or blood pressure measurements. Keep all follow-up visits. You may need to be screened for extra fluid around the lymph nodes and swelling in the breast and arm (lymphedema). Contact a health care provider if: You develop a rash. You have a fever. Your pain  worsens or pain medicine is not working. You have swelling, weakness, or numbness in your arm that does not improve after a few weeks. You have new swelling in your breast. You have any of these signs of infection: More redness, swelling, or pain in your incision area. Fluid or blood coming from your incision. Warmth coming from the incision area. Pus or a bad smell coming from your incision. Get help right away if: You have very bad pain in your breast or arm. You have swelling in your legs or arms. You have redness, warmth, or pain in your leg or arm. You have chest pain. You have difficulty breathing. These symptoms may be an emergency. Get help right away. Call 911. Do not wait to see if the symptoms will go away. Do not drive yourself to the hospital. Summary After the procedure, it is common to have breast tenderness, swelling in your breast, and stiffness in your arm and shoulder. Follow instructions from your health care provider about how to take care of your incision. Do not lift anything that is heavier than 10 lb (4.5 kg), or the limit that you are  told, until your health care provider says that it is safe. Avoid lifting with the arm that is on the side of your surgery. If you had any lymph nodes removed during your procedure, be sure to tell all of your health care providers. This information is not intended to replace advice given to you by your health care provider. Make sure you discuss any questions you have with your health care provider. Document Revised: 06/03/2021 Document Reviewed: 06/03/2021 Elsevier Patient Education  2024 [employer]General Anesthesia, Adult, Care After The following information offers guidance on how to care for yourself after your procedure. Your health care provider may also give you more specific instructions. If you have problems or questions, contact your health care provider. What can I expect after the procedure? After the procedure, it is common for people to: Have pain or discomfort at the IV site. Have nausea or vomiting. Have a sore throat or hoarseness. Have trouble concentrating. Feel cold or chills. Feel weak, sleepy, or tired (fatigue). Have soreness and body aches. These can affect parts of the body that were not involved in surgery. Follow these instructions at home: For the time period you were told by your health care provider:  Rest. Do not participate in activities where you could fall or become injured. Do not drive or use machinery. Do not drink alcohol. Do not take sleeping pills or medicines that cause drowsiness. Do not make important decisions or sign legal documents. Do not take care of children on your own. General instructions Drink enough fluid to keep your urine pale yellow. If you have sleep apnea, surgery and certain medicines can increase your risk for breathing problems. Follow instructions from your health care provider about wearing your sleep device: Anytime you are sleeping, including during daytime naps. While taking prescription pain medicines, sleeping  medicines, or medicines that make you drowsy. Return to your normal activities as told by your health care provider. Ask your health care provider what activities are safe for you. Take over-the-counter and prescription medicines only as told by your health care provider. Do not use any products that contain nicotine or tobacco. These products include cigarettes, chewing tobacco, and vaping devices, such as e-cigarettes. These can delay incision healing after surgery. If you need help quitting, ask your health care provider. Contact a health care provider if:  You have nausea or vomiting that does not get better with medicine. You vomit every time you eat or drink. You have pain that does not get better with medicine. You cannot urinate or have bloody urine. You develop a skin rash. You have a fever. Get help right away if: You have trouble breathing. You have chest pain. You vomit blood. These symptoms may be an emergency. Get help right away. Call 911. Do not wait to see if the symptoms will go away. Do not drive yourself to the hospital. Summary After the procedure, it is common to have a sore throat, hoarseness, nausea, vomiting, or to feel weak, sleepy, or fatigue. For the time period you were told by your health care provider, do not drive or use machinery. Get help right away if you have difficulty breathing, have chest pain, or vomit blood. These symptoms may be an emergency. This information is not intended to replace advice given to you by your health care provider. Make sure you discuss any questions you have with your health care provider. Document Revised: 06/22/2021 Document Reviewed: 06/22/2021 Elsevier Patient Education  2024 [employer] How to Use Chlorhexidine at Home in the Shower Chlorhexidine gluconate (CHG) is a germ-killing (antiseptic) wash that's used to clean the skin. It can get rid of the germs that normally live on the skin and can keep them away for about 24  hours. If you're having surgery, you may be told to shower with CHG at home the night before surgery. This can help lower your risk for infection. To use CHG wash in the shower, follow the steps below. Supplies needed: CHG body wash. Clean washcloth. Clean towel. How to use CHG in the shower Follow these steps unless you're told to use CHG in a different way: Start the shower. Use your normal soap and shampoo to wash your face and hair. Turn off the shower or move out of the shower stream. Pour CHG onto a clean washcloth. Do not use any type of brush or rough sponge. Start at your neck, washing your body down to your toes. Make sure you: Wash the part of your body where the surgery will be done for at least 1 minute. Do not scrub. Do not use CHG on your head or face unless your health care provider tells you to. If it gets into your ears or eyes, rinse them well with water. Do not wash your genitals with CHG. Wash your back and under your arms. Make sure to wash skin folds. Let the CHG sit on your skin for 1-2 minutes or as long as told. Rinse your entire body in the shower, including all body creases and folds. Turn off the shower. Dry off with a clean towel. Do not put anything on your skin afterward, such as powder, lotion, or perfume. Put on clean clothes or pajamas. If it's the night before surgery, sleep in clean sheets. General tips Use CHG only as told, and follow the instructions on the label. Use the full amount of CHG as told. This is often one bottle. Do not smoke and stay away from flames after using CHG. Your skin may feel sticky after using CHG. This is normal. The sticky feeling will go away as the CHG dries. Do not use CHG: If you have a chlorhexidine allergy or have reacted to chlorhexidine in the past. On open wounds or areas of skin that have broken skin, cuts, or scrapes. On babies younger than 43 months of age.  Contact a health care provider if: You have  questions about using CHG. Your skin gets irritated or itchy. You have a rash after using CHG. You swallow any CHG. Call your local poison control center [phone] in the [place] Your eyes itch badly, or they become very red or swollen. Your hearing changes. You have trouble seeing. If you can't reach your provider, go to an urgent care or emergency room. Do not drive yourself. Get help right away if: You have swelling or tingling in your mouth or throat. You make high-pitched whistling sounds when you breathe, most often when you breathe out (wheeze). You have trouble breathing. These symptoms may be an emergency. Call 911 right away. Do not wait to see if the symptoms will go away. Do not drive yourself to the hospital. This information is not intended to replace advice given to you by your health care provider. Make sure you discuss any questions you have with your health care provider. Document Revised: 10/08/2022 Document Reviewed: 10/04/2021 Elsevier Patient Education  2024 [employer].How to Use an Incentive Spirometer An incentive spirometer is a tool that measures how well you are filling your lungs with each breath. Learning to take long, deep breaths using this tool can help you keep your lungs clear and active. This may help to reverse or lessen your chance of developing breathing (pulmonary) problems, especially infection. You may be asked to use a spirometer: After a surgery. If you have a lung problem or a history of smoking. After a long period of time when you have been unable to move or be active. If the spirometer includes an indicator to show the highest number that you have reached, your health care provider or respiratory therapist will help you set a goal. Keep a log of your progress as told by your health care provider. What are the risks? Breathing too quickly may cause dizziness or cause you to pass out. Take your time so you do not get dizzy or  light-headed. If you are in pain, you may need to take pain medicine before doing incentive spirometry. It is harder to take a deep breath if you are having pain. How to use your incentive spirometer  Sit up on the edge of your bed or on a chair. Hold the incentive spirometer so that it is in an upright position. Before you use the spirometer, breathe out normally. Place the mouthpiece in your mouth. Make sure your lips are closed tightly around it. Breathe in slowly and as deeply as you can through your mouth, causing the piston or the ball to rise toward the top of the chamber. Hold your breath for 3-5 seconds, or for as long as possible. If the spirometer includes a coach indicator, use this to guide you in breathing. Slow down your breathing if the indicator goes above the marked areas. Remove the mouthpiece from your mouth and breathe out normally. The piston or ball will return to the bottom of the chamber. Rest for a few seconds, then repeat the steps 10 or more times. Take your time and take a few normal breaths between deep breaths so that you do not get dizzy or light-headed. Do this every 1-2 hours when you are awake. If the spirometer includes a goal marker to show the highest number you have reached (best effort), use this as a goal to work toward during each repetition. After each set of 10 deep breaths, cough a few times. This will help to make  sure that your lungs are clear. If you have an incision on your chest or abdomen from surgery, place a pillow or a rolled-up towel firmly against the incision when you cough. This can help to reduce pain while taking deep breaths and coughing. General tips When you are able to get out of bed: Walk around often. Continue to take deep breaths and cough in order to clear your lungs. Keep using the incentive spirometer until your health care provider says it is okay to stop using it. If you have been in the hospital, you may be told to keep  using the spirometer at home. Contact a health care provider if: You are having difficulty using the spirometer. You have trouble using the spirometer as often as instructed. Your pain medicine is not giving enough relief for you to use the spirometer as told. You have a fever. Get help right away if: You develop shortness of breath. You develop a cough with bloody mucus from the lungs. You have fluid or blood coming from an incision site after you cough. Summary An incentive spirometer is a tool that can help you learn to take long, deep breaths to keep your lungs clear and active. You may be asked to use a spirometer after a surgery, if you have a lung problem or a history of smoking, or if you have been inactive for a long period of time. Use your incentive spirometer as instructed every 1-2 hours while you are awake. If you have an incision on your chest or abdomen, place a pillow or a rolled-up towel firmly against your incision when you cough. This will help to reduce pain. Get help right away if you have shortness of breath, you cough up bloody mucus, or blood comes from your incision when you cough. This information is not intended to replace advice given to you by your health care provider. Make sure you discuss any questions you have with your health care provider. Document Revised: 01/31/2023 Document Reviewed: 01/31/2023 Elsevier Patient Education  2024 [employer].

## 2023-07-18 NOTE — H&P (Signed)
 Sandra Cuevas; [id number]; 1928/08/14   HPI Patient is a 89 year old white female who was referred to my care by oncology for evaluation treatment of a right breast cancer.  This was found incidentally during her admission for pneumonia earlier this year.  A 1.9 cm mass was noted in the lower, inner quadrant of the right breast.  Biopsy was positive for invasive mammary carcinoma.  It is ER/PR positive.  She was also noted to have microcalcifications in that area that expanded approximately 5 cm.  This was not biopsied.  No lymphadenopathy was noted.  Patient was referred by oncology as she has requested possible surgical excision of the cancer with a lumpectomy.  Patient has been on 2 -3 liters of oxygen due to her pneumonia.  She states this is stable.  She does get short of breath when walking distances. 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 "   COPD (chronic obstructive pulmonary disease) (HCC)    Diabetes mellitus    GERD (gastroesophageal reflux disease)    History of cardiovascular stress test 06/01/2010   EF 71% - no evidence of ischemia, normal left ventricular systolic function   History of hysterectomy 1965   Hyperlipidemia    Hypertension    Hypothyroidism    Shortness of breath    on exertion   Tubular adenoma of colon     Past Surgical History:  Procedure Laterality Date   ABDOMINAL HYSTERECTOMY     BREAST BIOPSY Right 07/08/2023   path pending   BREAST BIOPSY Right 07/08/2023   [place] RT BREAST BX W LOC DEV 1ST LESION IMG BX SPEC US GUIDE 07/08/2023 Edwin Cap, MD [facility]   CARDIAC SURGERY     catherization   HEMORRHOID SURGERY     TOTAL SHOULDER REPLACEMENT Right     Family History  Problem Relation Age of Onset   Heart disease Father        heart problems   Kidney failure Mother    Diabetes Mother    Leukemia Brother    Diabetes Brother    Colon cancer  Sister    Leukemia Brother    Diabetes Brother    Pancreatic cancer Brother    Bone cancer Sister     Current Outpatient Medications on File Prior to Visit  Medication Sig Dispense Refill   acetaminophen (TYLENOL) 500 MG tablet Take 2 tablets (1,000 mg total) by mouth every 6 (six) hours as needed. 30 tablet 0   albuterol (PROVENTIL) (2.5 MG/3ML) 0.083% nebulizer solution Take 3 mLs (2.5 mg total) by nebulization every 6 (six) hours as needed for wheezing or shortness of breath. 75 mL 0   amLODipine (NORVASC) 10 MG tablet Take 1 tablet (10 mg total) by mouth daily. 30 tablet 0   anastrozole (ARIMIDEX) 1 MG tablet Take 1 tablet (1 mg total) by mouth daily. 90 tablet 1   atorvastatin (LIPITOR) 40 MG tablet TAKE 1 TABLET(40 MG) BY MOUTH DAILY AT 6 PM 90 tablet 3   famotidine (PEPCID) 20 MG tablet Take 1 tablet (20 mg total) by mouth 2 (two) times daily. (Patient taking differently: Take 20 mg by mouth daily.) 180 tablet 1   fluticasone (FLONASE) 50 MCG/ACT nasal spray SHAKE LIQUID AND USE 2 SPRAYS IN EACH NOSTRIL DAILY 16 g 5   furosemide (LASIX) 20 MG tablet Take  20 mg by mouth daily.     gabapentin (NEURONTIN) 600 MG tablet Take 600 mg by mouth 3 (three) times daily.     levothyroxine (SYNTHROID, LEVOTHROID) 50 MCG tablet Take 50 mcg by mouth daily.     montelukast (SINGULAIR) 10 MG tablet Take 10 mg by mouth daily.     Multiple Vitamins-Minerals (PRESERVISION AREDS 2+MULTI VIT PO) Take 1 tablet by mouth daily.     omeprazole (PRILOSEC) 20 MG capsule TAKE 1 CAPSULE(20 MG) BY MOUTH DAILY 90 capsule 3   potassium chloride (KLOR-CON) 10 MEQ tablet Take 10 mEq by mouth daily.     zolpidem (AMBIEN) 5 MG tablet Take 0.5 tablets (2.5 mg total) by mouth at bedtime as needed for sleep.     No current facility-administered medications on file prior to visit.    No Known Allergies  Social History   Substance and Sexual Activity  Alcohol Use No    Social History   Tobacco Use  Smoking  Status Never  Smokeless Tobacco Never    Review of Systems  Constitutional:  Positive for malaise/fatigue.  HENT: Negative.    Eyes: Negative.   Respiratory:  Positive for shortness of breath.   Cardiovascular: Negative.   Gastrointestinal: Negative.   Genitourinary: Negative.   Musculoskeletal:  Positive for back pain and joint pain.  Skin: Negative.   Neurological: Negative.   Endo/Heme/Allergies: Negative.   Psychiatric/Behavioral: Negative.      Objective   Vitals:   07/17/23 1044  BP: (!) 164/71  Pulse: 76  Resp: 18  Temp: 98.1 F (36.7 C)  SpO2: 90%    Physical Exam Vitals reviewed.  Constitutional:      Appearance: Normal appearance. She is not ill-appearing.  HENT:     Head: Normocephalic and atraumatic.  Neck:     Vascular: No carotid bruit.  Cardiovascular:     Rate and Rhythm: Normal rate and regular rhythm.     Heart sounds: Normal heart sounds. No murmur heard.    No friction rub. No gallop.  Pulmonary:     Effort: Pulmonary effort is normal. No respiratory distress.     Breath sounds: Normal breath sounds. No stridor. No wheezing, rhonchi or rales.     Comments: Patient is on 2L nasal cannula Musculoskeletal:     Cervical back: Neck supple.  Lymphadenopathy:     Cervical: No cervical adenopathy.  Skin:    General: Skin is warm and dry.  Neurological:     Mental Status: She is alert and oriented to person, place, and time.   Breast: Bruising is noted in the lower, inner quadrant of the right breast.  She does have pendulous breasts.  No other dominant mass, nipple discharge, or dimpling is noted.  Left breast examination is unremarkable.  Both axillas are negative for palpable nodes.  Mammogram and pathology reports were reviewed Dr. Marice Potter note reviewed 2D echo done earlier this year shows an ejection fraction within normal limits.  Assessment  Right breast cancer, 1.9 cm in size with stranding microcalcifications which were not  biopsied.  Patient is on home O2. Given her pendulous breasts and the possibility of malignancy within the microcalcifications that extend 5 cm, a simple mastectomy is warranted.  Patient understands and agrees.  She does not want to repeat biopsies of the microcalcifications.  I do not feel a lumpectomy would give her the best results given her breast configuration. Plan  Patient is scheduled for a right simple mastectomy on 07/23/2023.  She understands that she is at increased risk for postoperative intubation given her pulmonary status.  This appears to be stable at this time.  The risks and benefits of the procedure including bleeding, infection, the need for blood transfusion, and the possibility of worsening pulmonary status were fully explained to the patient, who gave informed consent.

## 2023-07-21 DIAGNOSIS — J449 Chronic obstructive pulmonary disease, unspecified: Secondary | ICD-10-CM | POA: Diagnosis not present

## 2023-07-22 ENCOUNTER — Ambulatory Visit (HOSPITAL_COMMUNITY)
Admission: RE | Admit: 2023-07-22 | Discharge: 2023-07-22 | Disposition: A | Discharge status: Home / Self Care | Source: Ambulatory Visit | Attending: General Surgery | Admitting: General Surgery

## 2023-07-22 ENCOUNTER — Encounter (HOSPITAL_COMMUNITY)
Admission: RE | Admit: 2023-07-22 | Discharge: 2023-07-22 | Disposition: A | Discharge status: Home / Self Care | Source: Ambulatory Visit | Attending: General Surgery | Admitting: General Surgery

## 2023-07-22 ENCOUNTER — Ambulatory Visit (HOSPITAL_COMMUNITY)
Admission: RE | Admit: 2023-07-22 | Discharge: 2023-07-22 | Disposition: A | Discharge status: Home / Self Care | Source: Ambulatory Visit | Attending: Hematology | Admitting: Hematology

## 2023-07-22 ENCOUNTER — Encounter (HOSPITAL_COMMUNITY): Payer: Self-pay

## 2023-07-22 VITALS — BP 165/71 | HR 76 | Temp 98.1°F | Resp 18 | Ht 63.0 in | Wt 206.0 lb

## 2023-07-22 DIAGNOSIS — I700 Atherosclerosis of aorta: Secondary | ICD-10-CM | POA: Diagnosis not present

## 2023-07-22 DIAGNOSIS — Z13820 Encounter for screening for osteoporosis: Secondary | ICD-10-CM | POA: Insufficient documentation

## 2023-07-22 DIAGNOSIS — E119 Type 2 diabetes mellitus without complications: Secondary | ICD-10-CM

## 2023-07-22 DIAGNOSIS — M810 Age-related osteoporosis without current pathological fracture: Secondary | ICD-10-CM | POA: Diagnosis not present

## 2023-07-22 DIAGNOSIS — Z01818 Encounter for other preprocedural examination: Secondary | ICD-10-CM | POA: Insufficient documentation

## 2023-07-22 DIAGNOSIS — Z780 Asymptomatic menopausal state: Secondary | ICD-10-CM | POA: Diagnosis not present

## 2023-07-22 DIAGNOSIS — C50311 Malignant neoplasm of lower-inner quadrant of right female breast: Secondary | ICD-10-CM | POA: Insufficient documentation

## 2023-07-22 DIAGNOSIS — Z170 Estrogen receptor positive status [ER+]: Secondary | ICD-10-CM | POA: Insufficient documentation

## 2023-07-22 DIAGNOSIS — R918 Other nonspecific abnormal finding of lung field: Secondary | ICD-10-CM | POA: Diagnosis not present

## 2023-07-22 DIAGNOSIS — C50919 Malignant neoplasm of unspecified site of unspecified female breast: Secondary | ICD-10-CM | POA: Diagnosis not present

## 2023-07-22 DIAGNOSIS — J90 Pleural effusion, not elsewhere classified: Secondary | ICD-10-CM | POA: Diagnosis not present

## 2023-07-22 LAB — COMPREHENSIVE METABOLIC PANEL WITH GFR
ALT: 17 U/L (ref 0–44)
AST: 24 U/L (ref 15–41)
Albumin: 2.9 g/dL — ABNORMAL LOW (ref 3.5–5.0)
Alkaline Phosphatase: 76 U/L (ref 38–126)
Anion gap: 7 (ref 5–15)
BUN: 15 mg/dL (ref 8–23)
CO2: 37 mmol/L — ABNORMAL HIGH (ref 22–32)
Calcium: 9.5 mg/dL (ref 8.9–10.3)
Chloride: 95 mmol/L — ABNORMAL LOW (ref 98–111)
Creatinine, Ser: 0.86 mg/dL (ref 0.44–1.00)
GFR, Estimated: >60 mL/min (ref >60)
Glucose, Bld: 172 mg/dL — ABNORMAL HIGH (ref 70–99)
Potassium: 4.3 mmol/L (ref 3.5–5.1)
Sodium: 139 mmol/L (ref 135–145)
Total Bilirubin: 0.8 mg/dL (ref 0.0–1.2)
Total Protein: 6.3 g/dL — ABNORMAL LOW (ref 6.5–8.1)

## 2023-07-22 LAB — CBC WITH DIFFERENTIAL/PLATELET
Abs Immature Granulocytes: 0.03 10*3/uL (ref 0.00–0.07)
Basophils Absolute: 0 10*3/uL (ref 0.0–0.1)
Basophils Relative: 0 %
Eosinophils Absolute: 0.1 10*3/uL (ref 0.0–0.5)
Eosinophils Relative: 2 %
HCT: 27.9 % — ABNORMAL LOW (ref 36.0–46.0)
Hemoglobin: 8.6 g/dL — ABNORMAL LOW (ref 12.0–15.0)
Immature Granulocytes: 0 %
Lymphocytes Relative: 14 %
Lymphs Abs: 1.1 10*3/uL (ref 0.7–4.0)
MCH: 31.6 pg (ref 26.0–34.0)
MCHC: 30.8 g/dL (ref 30.0–36.0)
MCV: 102.6 fL — ABNORMAL HIGH (ref 80.0–100.0)
Monocytes Absolute: 1.1 10*3/uL — ABNORMAL HIGH (ref 0.1–1.0)
Monocytes Relative: 14 %
Neutro Abs: 5.5 10*3/uL (ref 1.7–7.7)
Neutrophils Relative %: 70 %
Platelets: 155 10*3/uL (ref 150–400)
RBC: 2.72 MIL/uL — ABNORMAL LOW (ref 3.87–5.11)
RDW: 14.7 % (ref 11.5–15.5)
WBC: 7.9 10*3/uL (ref 4.0–10.5)
nRBC: 0 % (ref 0.0–0.2)

## 2023-07-22 LAB — TYPE AND SCREEN
ABO/RH(D): O POS
Antibody Screen: NEGATIVE

## 2023-07-22 LAB — HEMOGLOBIN A1C
Hgb A1c MFr Bld: 6.3 % — ABNORMAL HIGH (ref 4.8–5.6)
Mean Plasma Glucose: 134.11 mg/dL

## 2023-07-23 ENCOUNTER — Other Ambulatory Visit: Payer: Self-pay

## 2023-07-23 ENCOUNTER — Inpatient Hospital Stay (HOSPITAL_COMMUNITY)

## 2023-07-23 ENCOUNTER — Encounter (HOSPITAL_COMMUNITY)
Admission: RE | Disposition: A | Discharge status: Home / Self Care | Payer: Self-pay | Source: Home / Self Care | Attending: General Surgery

## 2023-07-23 ENCOUNTER — Inpatient Hospital Stay (HOSPITAL_COMMUNITY)
Admission: RE | Admit: 2023-07-23 | Discharge: 2023-07-25 | DRG: 582 | Disposition: A | Discharge status: Home / Self Care | Attending: General Surgery | Admitting: General Surgery

## 2023-07-23 ENCOUNTER — Other Ambulatory Visit (HOSPITAL_COMMUNITY)

## 2023-07-23 ENCOUNTER — Encounter (HOSPITAL_COMMUNITY): Payer: Self-pay | Admitting: General Surgery

## 2023-07-23 DIAGNOSIS — Z833 Family history of diabetes mellitus: Secondary | ICD-10-CM | POA: Diagnosis not present

## 2023-07-23 DIAGNOSIS — C50311 Malignant neoplasm of lower-inner quadrant of right female breast: Principal | ICD-10-CM | POA: Diagnosis present

## 2023-07-23 DIAGNOSIS — Z860101 Personal history of adenomatous and serrated colon polyps: Secondary | ICD-10-CM | POA: Diagnosis not present

## 2023-07-23 DIAGNOSIS — Z800 Family history of malignant neoplasm of digestive organs: Secondary | ICD-10-CM

## 2023-07-23 DIAGNOSIS — Z8249 Family history of ischemic heart disease and other diseases of the circulatory system: Secondary | ICD-10-CM | POA: Diagnosis not present

## 2023-07-23 DIAGNOSIS — E039 Hypothyroidism, unspecified: Secondary | ICD-10-CM | POA: Diagnosis not present

## 2023-07-23 DIAGNOSIS — Z79899 Other long term (current) drug therapy: Secondary | ICD-10-CM | POA: Diagnosis not present

## 2023-07-23 DIAGNOSIS — Z806 Family history of leukemia: Secondary | ICD-10-CM | POA: Diagnosis not present

## 2023-07-23 DIAGNOSIS — Z1721 Progesterone receptor positive status: Secondary | ICD-10-CM | POA: Diagnosis not present

## 2023-07-23 DIAGNOSIS — Z79890 Hormone replacement therapy (postmenopausal): Secondary | ICD-10-CM

## 2023-07-23 DIAGNOSIS — N641 Fat necrosis of breast: Secondary | ICD-10-CM | POA: Diagnosis not present

## 2023-07-23 DIAGNOSIS — Z9011 Acquired absence of right breast and nipple: Secondary | ICD-10-CM

## 2023-07-23 DIAGNOSIS — I5030 Unspecified diastolic (congestive) heart failure: Secondary | ICD-10-CM | POA: Diagnosis not present

## 2023-07-23 DIAGNOSIS — E119 Type 2 diabetes mellitus without complications: Secondary | ICD-10-CM | POA: Diagnosis not present

## 2023-07-23 DIAGNOSIS — I10 Essential (primary) hypertension: Secondary | ICD-10-CM | POA: Diagnosis not present

## 2023-07-23 DIAGNOSIS — J189 Pneumonia, unspecified organism: Secondary | ICD-10-CM | POA: Diagnosis present

## 2023-07-23 DIAGNOSIS — Z170 Estrogen receptor positive status [ER+]: Secondary | ICD-10-CM

## 2023-07-23 DIAGNOSIS — I130 Hypertensive heart and chronic kidney disease with heart failure and stage 1 through stage 4 chronic kidney disease, or unspecified chronic kidney disease: Secondary | ICD-10-CM

## 2023-07-23 DIAGNOSIS — Z01818 Encounter for other preprocedural examination: Principal | ICD-10-CM

## 2023-07-23 DIAGNOSIS — Z9981 Dependence on supplemental oxygen: Secondary | ICD-10-CM

## 2023-07-23 DIAGNOSIS — K219 Gastro-esophageal reflux disease without esophagitis: Secondary | ICD-10-CM | POA: Diagnosis not present

## 2023-07-23 DIAGNOSIS — D62 Acute posthemorrhagic anemia: Secondary | ICD-10-CM | POA: Diagnosis present

## 2023-07-23 DIAGNOSIS — Z90710 Acquired absence of both cervix and uterus: Secondary | ICD-10-CM | POA: Diagnosis not present

## 2023-07-23 DIAGNOSIS — J440 Chronic obstructive pulmonary disease with acute lower respiratory infection: Secondary | ICD-10-CM | POA: Diagnosis not present

## 2023-07-23 DIAGNOSIS — Z841 Family history of disorders of kidney and ureter: Secondary | ICD-10-CM

## 2023-07-23 DIAGNOSIS — Z1732 Human epidermal growth factor receptor 2 negative status: Secondary | ICD-10-CM | POA: Diagnosis not present

## 2023-07-23 DIAGNOSIS — Z96611 Presence of right artificial shoulder joint: Secondary | ICD-10-CM | POA: Diagnosis present

## 2023-07-23 DIAGNOSIS — E785 Hyperlipidemia, unspecified: Secondary | ICD-10-CM | POA: Diagnosis not present

## 2023-07-23 DIAGNOSIS — C50911 Malignant neoplasm of unspecified site of right female breast: Secondary | ICD-10-CM | POA: Diagnosis not present

## 2023-07-23 DIAGNOSIS — J449 Chronic obstructive pulmonary disease, unspecified: Secondary | ICD-10-CM | POA: Diagnosis not present

## 2023-07-23 HISTORY — PX: TOTAL MASTECTOMY: SHX6129

## 2023-07-23 LAB — GLUCOSE, CAPILLARY
Glucose-Capillary: 137 mg/dL — ABNORMAL HIGH (ref 70–99)
Glucose-Capillary: 163 mg/dL — ABNORMAL HIGH (ref 70–99)

## 2023-07-23 SURGERY — MASTECTOMY, SIMPLE
Anesthesia: General | Site: Breast | Laterality: Right

## 2023-07-23 MED ORDER — TRAMADOL HCL 50 MG PO TABS: 50.0000 mg | ORAL_TABLET | Freq: Four times a day (QID) | ORAL | Status: DC | PRN

## 2023-07-23 MED ORDER — SUGAMMADEX SODIUM 200 MG/2ML IV SOLN
INTRAVENOUS | Status: AC
  Filled 2023-07-23: qty 2

## 2023-07-23 MED ORDER — SUGAMMADEX SODIUM 200 MG/2ML IV SOLN
INTRAVENOUS | Status: DC | PRN
Start: 2023-07-23 — End: 2023-07-23
  Administered 2023-07-23: 200 mg via INTRAVENOUS

## 2023-07-23 MED ORDER — LIDOCAINE HCL (PF) 2 % IJ SOLN
INTRAMUSCULAR | Status: DC | PRN
  Administered 2023-07-23: 80 mg via INTRADERMAL

## 2023-07-23 MED ORDER — PROPOFOL 10 MG/ML IV BOLUS
INTRAVENOUS | Status: AC
  Filled 2023-07-23: qty 20

## 2023-07-23 MED ORDER — DOCUSATE SODIUM 100 MG PO CAPS
100.0000 mg | ORAL_CAPSULE | Freq: Every day | ORAL | Status: DC
  Administered 2023-07-23 – 2023-07-24 (×2): 100 mg via ORAL
  Filled 2023-07-23 (×2): qty 1

## 2023-07-23 MED ORDER — MONTELUKAST SODIUM 10 MG PO TABS
10.0000 mg | ORAL_TABLET | Freq: Every day | ORAL | Status: DC
  Administered 2023-07-23 – 2023-07-24 (×2): 10 mg via ORAL
  Filled 2023-07-23 (×2): qty 1

## 2023-07-23 MED ORDER — CEFAZOLIN SODIUM-DEXTROSE 2-4 GM/100ML-% IV SOLN
2.0000 g | INTRAVENOUS | Status: AC
  Administered 2023-07-23: 2 g via INTRAVENOUS
  Filled 2023-07-23: qty 100

## 2023-07-23 MED ORDER — ACETAMINOPHEN 650 MG RE SUPP: 650.0000 mg | Freq: Four times a day (QID) | RECTAL | Status: DC | PRN

## 2023-07-23 MED ORDER — CHLORHEXIDINE GLUCONATE CLOTH 2 % EX PADS
6.0000 | MEDICATED_PAD | Freq: Once | CUTANEOUS | Status: AC
  Administered 2023-07-23: 6 via TOPICAL

## 2023-07-23 MED ORDER — ONDANSETRON HCL 4 MG/2ML IJ SOLN: 4.0000 mg | Freq: Four times a day (QID) | INTRAMUSCULAR | Status: DC | PRN

## 2023-07-23 MED ORDER — ENOXAPARIN SODIUM 40 MG/0.4ML IJ SOSY
PREFILLED_SYRINGE | INTRAMUSCULAR | Status: AC
Start: 2023-07-23 — End: 2023-07-24
  Administered 2023-07-24: 40 mg via SUBCUTANEOUS
  Filled 2023-07-23: qty 0.4

## 2023-07-23 MED ORDER — FENTANYL CITRATE PF 50 MCG/ML IJ SOSY
25.0000 ug | PREFILLED_SYRINGE | INTRAMUSCULAR | Status: DC | PRN
  Administered 2023-07-23 (×2): 25 ug via INTRAVENOUS
  Filled 2023-07-23: qty 1

## 2023-07-23 MED ORDER — DIPHENHYDRAMINE HCL 12.5 MG/5ML PO ELIX: 12.5000 mg | ORAL_SOLUTION | Freq: Four times a day (QID) | ORAL | Status: DC | PRN

## 2023-07-23 MED ORDER — CHLORHEXIDINE GLUCONATE CLOTH 2 % EX PADS
6.0000 | MEDICATED_PAD | Freq: Once | CUTANEOUS | Status: DC
Start: 2023-07-23 — End: 2023-07-23

## 2023-07-23 MED ORDER — FENTANYL CITRATE (PF) 100 MCG/2ML IJ SOLN
INTRAMUSCULAR | Status: DC | PRN
  Administered 2023-07-23: 50 ug via INTRAVENOUS
  Administered 2023-07-23 (×2): 25 ug via INTRAVENOUS

## 2023-07-23 MED ORDER — CHLORHEXIDINE GLUCONATE 0.12 % MT SOLN
OROMUCOSAL | Status: AC
  Administered 2023-07-23: 15 mL
  Filled 2023-07-23: qty 15

## 2023-07-23 MED ORDER — DEXMEDETOMIDINE HCL IN NACL 80 MCG/20ML IV SOLN
INTRAVENOUS | Status: DC | PRN
  Administered 2023-07-23: 4 ug via INTRAVENOUS

## 2023-07-23 MED ORDER — POVIDONE-IODINE 10 % EX OINT
TOPICAL_OINTMENT | CUTANEOUS | Status: AC
  Filled 2023-07-23: qty 56.8

## 2023-07-23 MED ORDER — ENOXAPARIN SODIUM 40 MG/0.4ML IJ SOSY
40.0000 mg | PREFILLED_SYRINGE | Freq: Once | INTRAMUSCULAR | Status: AC
  Administered 2023-07-23: 40 mg via SUBCUTANEOUS

## 2023-07-23 MED ORDER — FLUTICASONE PROPIONATE 50 MCG/ACT NA SUSP: 2.0000 | Freq: Every day | NASAL | Status: DC | PRN

## 2023-07-23 MED ORDER — ONDANSETRON HCL 4 MG/2ML IJ SOLN: 4.0000 mg | Freq: Once | INTRAMUSCULAR | Status: DC | PRN

## 2023-07-23 MED ORDER — DIPHENHYDRAMINE HCL 50 MG/ML IJ SOLN: 12.5000 mg | Freq: Four times a day (QID) | INTRAMUSCULAR | Status: DC | PRN

## 2023-07-23 MED ORDER — BUPIVACAINE HCL (PF) 0.5 % IJ SOLN
INTRAMUSCULAR | Status: AC
  Filled 2023-07-23: qty 30

## 2023-07-23 MED ORDER — MORPHINE SULFATE (PF) 2 MG/ML IV SOLN: 1.0000 mg | INTRAVENOUS | Status: DC | PRN

## 2023-07-23 MED ORDER — ZOLPIDEM TARTRATE 5 MG PO TABS
5.0000 mg | ORAL_TABLET | Freq: Every evening | ORAL | Status: DC | PRN
  Administered 2023-07-24: 5 mg via ORAL
  Filled 2023-07-23: qty 1

## 2023-07-23 MED ORDER — PROPOFOL 10 MG/ML IV BOLUS
INTRAVENOUS | Status: DC | PRN
  Administered 2023-07-23: 100 mg via INTRAVENOUS

## 2023-07-23 MED ORDER — ROCURONIUM BROMIDE 10 MG/ML (PF) SYRINGE
PREFILLED_SYRINGE | INTRAVENOUS | Status: DC | PRN
Start: 2023-07-23 — End: 2023-07-23
  Administered 2023-07-23: 50 mg via INTRAVENOUS

## 2023-07-23 MED ORDER — 0.9 % SODIUM CHLORIDE (POUR BTL) OPTIME
TOPICAL | Status: DC | PRN
  Administered 2023-07-23: 1000 mL

## 2023-07-23 MED ORDER — OXYCODONE HCL 5 MG PO TABS: 5.0000 mg | ORAL_TABLET | Freq: Once | ORAL | Status: DC | PRN

## 2023-07-23 MED ORDER — ALBUTEROL SULFATE (2.5 MG/3ML) 0.083% IN NEBU: 2.5000 mg | INHALATION_SOLUTION | Freq: Four times a day (QID) | RESPIRATORY_TRACT | Status: DC | PRN

## 2023-07-23 MED ORDER — FENTANYL CITRATE (PF) 100 MCG/2ML IJ SOLN
INTRAMUSCULAR | Status: AC
  Filled 2023-07-23: qty 2

## 2023-07-23 MED ORDER — CALCIUM CARBONATE 1250 (500 CA) MG PO TABS
600.0000 mg | ORAL_TABLET | Freq: Every day | ORAL | Status: DC
  Administered 2023-07-23 – 2023-07-24 (×2): 625 mg via ORAL
  Filled 2023-07-23 (×2): qty 1

## 2023-07-23 MED ORDER — AMLODIPINE BESYLATE 5 MG PO TABS
10.0000 mg | ORAL_TABLET | Freq: Every day | ORAL | Status: DC
Start: 2023-07-24 — End: 2023-07-25
  Administered 2023-07-24 – 2023-07-25 (×2): 10 mg via ORAL
  Filled 2023-07-23 (×2): qty 2

## 2023-07-23 MED ORDER — ENOXAPARIN SODIUM 40 MG/0.4ML IJ SOSY
40.0000 mg | PREFILLED_SYRINGE | INTRAMUSCULAR | Status: DC
  Administered 2023-07-25: 40 mg via SUBCUTANEOUS
  Filled 2023-07-23 (×2): qty 0.4

## 2023-07-23 MED ORDER — ONDANSETRON 4 MG PO TBDP: 4.0000 mg | ORAL_TABLET | Freq: Four times a day (QID) | ORAL | Status: DC | PRN

## 2023-07-23 MED ORDER — POVIDONE-IODINE 10 % EX OINT
TOPICAL_OINTMENT | CUTANEOUS | Status: DC | PRN
  Administered 2023-07-23: 1 via TOPICAL

## 2023-07-23 MED ORDER — OXYCODONE HCL 5 MG/5ML PO SOLN: 5.0000 mg | Freq: Once | ORAL | Status: DC | PRN

## 2023-07-23 MED ORDER — GABAPENTIN 300 MG PO CAPS
600.0000 mg | ORAL_CAPSULE | Freq: Three times a day (TID) | ORAL | Status: DC
  Administered 2023-07-23 – 2023-07-25 (×6): 600 mg via ORAL
  Filled 2023-07-23 (×6): qty 2

## 2023-07-23 MED ORDER — BUPIVACAINE HCL (PF) 0.5 % IJ SOLN
INTRAMUSCULAR | Status: DC | PRN
  Administered 2023-07-23: 30 mL

## 2023-07-23 MED ORDER — ONDANSETRON HCL 4 MG/2ML IJ SOLN
INTRAMUSCULAR | Status: DC | PRN
  Administered 2023-07-23: 4 mg via INTRAVENOUS

## 2023-07-23 MED ORDER — LACTATED RINGERS IV SOLN: INTRAVENOUS | Status: DC | PRN

## 2023-07-23 MED ORDER — PHENYLEPHRINE HCL-NACL 20-0.9 MG/250ML-% IV SOLN
INTRAVENOUS | Status: DC | PRN
  Administered 2023-07-23: 40 ug/min via INTRAVENOUS

## 2023-07-23 MED ORDER — PHENYLEPHRINE 80 MCG/ML (10ML) SYRINGE FOR IV PUSH (FOR BLOOD PRESSURE SUPPORT)
PREFILLED_SYRINGE | INTRAVENOUS | Status: DC | PRN
  Administered 2023-07-23: 80 ug via INTRAVENOUS

## 2023-07-23 MED ORDER — ATORVASTATIN CALCIUM 40 MG PO TABS
40.0000 mg | ORAL_TABLET | Freq: Every day | ORAL | Status: DC
  Administered 2023-07-24: 40 mg via ORAL
  Filled 2023-07-23 (×2): qty 1

## 2023-07-23 MED ORDER — ACETAMINOPHEN 325 MG PO TABS
650.0000 mg | ORAL_TABLET | Freq: Four times a day (QID) | ORAL | Status: DC | PRN
  Administered 2023-07-24 – 2023-07-25 (×4): 650 mg via ORAL
  Filled 2023-07-23 (×4): qty 2

## 2023-07-23 MED ORDER — LEVOTHYROXINE SODIUM 50 MCG PO TABS
50.0000 ug | ORAL_TABLET | Freq: Every day | ORAL | Status: DC
  Administered 2023-07-24 – 2023-07-25 (×2): 50 ug via ORAL
  Filled 2023-07-23 (×2): qty 1

## 2023-07-23 MED ORDER — ROCURONIUM BROMIDE 10 MG/ML (PF) SYRINGE
PREFILLED_SYRINGE | INTRAVENOUS | Status: AC
  Filled 2023-07-23: qty 10

## 2023-07-23 MED ORDER — DEXAMETHASONE SODIUM PHOSPHATE 10 MG/ML IJ SOLN
INTRAMUSCULAR | Status: DC | PRN
  Administered 2023-07-23: 10 mg via INTRAVENOUS

## 2023-07-23 MED ORDER — SODIUM CHLORIDE 0.9 % IV SOLN: INTRAVENOUS | Status: DC

## 2023-07-23 MED ORDER — PANTOPRAZOLE SODIUM 40 MG PO TBEC
40.0000 mg | DELAYED_RELEASE_TABLET | Freq: Every day | ORAL | Status: DC
  Administered 2023-07-24: 40 mg via ORAL
  Filled 2023-07-23 (×2): qty 1

## 2023-07-23 MED ORDER — SIMETHICONE 80 MG PO CHEW: 40.0000 mg | CHEWABLE_TABLET | Freq: Four times a day (QID) | ORAL | Status: DC | PRN

## 2023-07-23 SURGICAL SUPPLY — 34 items
BINDER BREAST XLRG (GAUZE/BANDAGES/DRESSINGS) IMPLANT
BLADE SURG SZ10 CARB STEEL (BLADE) IMPLANT
CHLORAPREP W/TINT 26 (MISCELLANEOUS) ×1 IMPLANT
CLOTH BEACON ORANGE TIMEOUT ST (SAFETY) ×1 IMPLANT
COVER LIGHT HANDLE STERIS (MISCELLANEOUS) ×2 IMPLANT
ELECT REM PT RETURN 9FT ADLT (ELECTROSURGICAL) ×1 IMPLANT
ELECTRODE REM PT RTRN 9FT ADLT (ELECTROSURGICAL) ×1 IMPLANT
EVACUATOR DRAINAGE 10X20 100CC (DRAIN) IMPLANT
GAUZE SPONGE 4X4 12PLY STRL (GAUZE/BANDAGES/DRESSINGS) IMPLANT
GLOVE BIO SURGEON STRL SZ7 (GLOVE) IMPLANT
GLOVE BIOGEL PI IND STRL 7.0 (GLOVE) ×2 IMPLANT
GLOVE SURG SS PI 7.5 STRL IVOR (GLOVE) ×2 IMPLANT
GOWN STRL REUS W/TWL LRG LVL3 (GOWN DISPOSABLE) ×2 IMPLANT
KIT TURNOVER KIT A (KITS) ×1 IMPLANT
MANIFOLD NEPTUNE II (INSTRUMENTS) ×1 IMPLANT
NDL HYPO 21X1.5 SAFETY (NEEDLE) IMPLANT
NEEDLE HYPO 21X1.5 SAFETY (NEEDLE) ×1 IMPLANT
NS IRRIG 1000ML POUR BTL (IV SOLUTION) ×1 IMPLANT
PACK MINOR (CUSTOM PROCEDURE TRAY) ×1 IMPLANT
PAD ABD 5X9 TENDERSORB (GAUZE/BANDAGES/DRESSINGS) IMPLANT
PAD ARMBOARD POSITIONER FOAM (MISCELLANEOUS) ×1 IMPLANT
POSITIONER HEAD 8X9X4 ADT (SOFTGOODS) ×1 IMPLANT
SET BASIN LINEN APH (SET/KITS/TRAYS/PACK) ×1 IMPLANT
SPONGE DRAIN TRACH 4X4 STRL 2S (GAUZE/BANDAGES/DRESSINGS) IMPLANT
SPONGE T-LAP 18X18 ~~LOC~~+RFID (SPONGE) ×1 IMPLANT
STAPLER VISISTAT (STAPLE) IMPLANT
SUT ETHILON 3 0 PS 1 (SUTURE) IMPLANT
SUT MNCRL AB 4-0 PS2 18 (SUTURE) ×1 IMPLANT
SUT SILK 2 0 SH (SUTURE) IMPLANT
SUT SILK 2 0 TIES 17X18 (SUTURE) IMPLANT
SUT VIC AB 2-0 CT1 TAPERPNT 27 (SUTURE) IMPLANT
SUT VIC AB 3-0 SH 27X BRD (SUTURE) ×1 IMPLANT
SYR 30ML LL (SYRINGE) ×1 IMPLANT
SYR BULB IRRIG 60ML STRL (SYRINGE) ×1 IMPLANT

## 2023-07-23 NOTE — Progress Notes (Signed)
   07/23/23 1721  TOC Brief Assessment  Insurance and Status Reviewed  Patient has primary care physician Yes  Home environment has been reviewed From home  Prior level of function: Independent  Prior/Current Home Services Current home services (Oxygen (unknown vendor))  Social Drivers of Health Review SDOH reviewed no interventions necessary  Transition of care needs no transition of care needs at this time   Pt from home alone c/family members living across the street and extended family nearby. Pt has home O2, walker, shower chair, grab bars.   Transition of Care Department [facility]) has reviewed patient and no other TOC needs have been identified at this time. We will continue to monitor patient advancement through interdisciplinary progression rounds. If new patient needs arise, please place a TOC consult.

## 2023-07-23 NOTE — Op Note (Signed)
 Patient:  Sandra Cuevas  DOB:  28-Jun-1928  MRN:  [id number]   Preop Diagnosis: Right breast cancer  Postop Diagnosis: Same  Procedure: Right simple mastectomy  Surgeon: [name], MD  Anes: General Endotracheal  Indications: Patient is a 89 year old white female with invasive carcinoma of the right breast, ER positive, who presents for a right simple mastectomy.  The risks and benefits of the procedure including bleeding, infection, cardiopulmonary difficulties, and the possibility of a blood transfusion were fully explained to the patient, who gave informed consent.  Procedure note: The patient was placed in the supine position.  After induction of general endotracheal anesthesia, the right breast was prepped and draped using the usual sterile technique with ChloraPrep.  Surgical site confirmation was performed.  An elliptical incision was made medial to lateral around the right nipple.  A superior flap was formed to the clavicle and an inferior flap formed to the chest wall.  There was a hard area in the lower, inner quadrant of the right breast.  I was able to get some normal appearing tissue around it as it was close to the skin.  The right breast was then removed from the pectoralis major muscle medial to lateral using Bovie electrocautery.  A suture was placed superiorly for orientation purposes.  The specimen was sent to pathology for further examination.  The bleeding was controlled using Bovie electrocautery.  0.5% Sensorcaine was instilled into the surrounding wound.  A #10 flat Jackson-Pratt drain was placed under the skin flap and brought through a separate stab wound to the inferior to the incision. The subcutaneous layer was reapproximated using a 2-0 Vicryl interrupted suture.   It was secured to the place using a 3-0 nylon interrupted suture.  The skin was closed using staples.  Betadine ointment and dry sterile dressings were applied.  All tape and needle counts were correct  at the end of the procedure.  The patient was extubated in the operating room and transferred to PACU in stable condition.  Complications: None  EBL: 25 cc  Specimen: Right breast  Drains: Jackson-Pratt drain to right breast flap

## 2023-07-23 NOTE — Anesthesia Procedure Notes (Signed)
 Procedure Name: Intubation Date/Time: 07/23/2023 9:24 AM  Performed by: Delphine Fiedler, RNPre-anesthesia Checklist: Patient identified, Emergency Drugs available, Suction available and Patient being monitored Patient Re-evaluated:Patient Re-evaluated prior to induction Oxygen Delivery Method: Circle system utilized Preoxygenation: Pre-oxygenation with 100% oxygen Induction Type: IV induction Ventilation: Mask ventilation without difficulty Laryngoscope Size: Mac and 3 Grade View: Grade I Tube type: Oral Tube size: 7.0 mm Number of attempts: 1 Airway Equipment and Method: Stylet and Oral airway Placement Confirmation: ETT inserted through vocal cords under direct vision, positive ETCO2 and breath sounds checked- equal and bilateral Secured at: 22 cm Tube secured with: Tape Dental Injury: Teeth and Oropharynx as per pre-operative assessment

## 2023-07-23 NOTE — Anesthesia Preprocedure Evaluation (Addendum)
 Anesthesia Evaluation  Patient identified by MRN, date of birth, ID band Patient awake    Airway Mallampati: II  TM Distance: >3 FB Neck ROM: Limited    Dental no notable dental hx. (+) Teeth Intact   Pulmonary shortness of breath and Long-Term Oxygen Therapy, asthma , pneumonia, resolved, COPD Pneumonia 1/25; hospitilized for 5 days    + decreased breath sounds      Cardiovascular hypertension, + Peripheral Vascular Disease and +CHF  II Rhythm:Regular Rate:Normal  Preserved EF of 60-65% Mild to moderate aortic stenosis   Neuro/Psych  Neuromuscular disease    GI/Hepatic ,GERD  ,,  Endo/Other  diabetesHypothyroidism    Renal/GU Renal disease     Musculoskeletal  (+) Arthritis ,    Abdominal Normal abdominal exam  (+)   Peds  Hematology   Anesthesia Other Findings   Reproductive/Obstetrics                             Anesthesia Physical Anesthesia Plan  ASA: 4  Anesthesia Plan: General   Post-op Pain Management:    Induction: Intravenous  PONV Risk Score and Plan:   Airway Management Planned: Oral ETT  Additional Equipment: ClearSight  Intra-op Plan:   Post-operative Plan:   Informed Consent: I have reviewed the patients History and Physical, chart, labs and discussed the procedure including the risks, benefits and alternatives for the proposed anesthesia with the patient or authorized representative who has indicated his/her understanding and acceptance.       Plan Discussed with: CRNA  Anesthesia Plan Comments:        Anesthesia Quick Evaluation

## 2023-07-23 NOTE — Anesthesia Postprocedure Evaluation (Signed)
 Anesthesia Post Note  Patient: Sandra Cuevas  Procedure(s) Performed: MASTECTOMY, SIMPLE (Right: Breast)  Patient location during evaluation: PACU Anesthesia Type: General Level of consciousness: awake and alert Pain management: pain level controlled Vital Signs Assessment: post-procedure vital signs reviewed and stable Respiratory status: spontaneous breathing, nonlabored ventilation, respiratory function stable and patient connected to nasal cannula oxygen Cardiovascular status: blood pressure returned to baseline and stable Postop Assessment: no apparent nausea or vomiting Anesthetic complications: no  No notable events documented.   Last Vitals:  Vitals:   07/23/23 1100 07/23/23 1115  BP: (!) 83/46 (!) 76/46  Pulse: 69 70  Resp: 18 (!) 0  Temp:    SpO2: 92% 90%    Last Pain:  Vitals:   07/23/23 1115  PainSc: 3                  Beacher Limerick

## 2023-07-23 NOTE — Plan of Care (Signed)
  Problem: Education: Goal: Knowledge of General Education information will improve Description: Including pain rating scale, medication(s)/side effects and non-pharmacologic comfort measures Outcome: Progressing   Problem: Nutrition: Goal: Adequate nutrition will be maintained Outcome: Progressing   Problem: Pain Managment: Goal: General experience of comfort will improve and/or be controlled Outcome: Progressing

## 2023-07-23 NOTE — Transfer of Care (Signed)
 Immediate Anesthesia Transfer of Care Note  Patient: Sandra Cuevas  Procedure(s) Performed: MASTECTOMY, SIMPLE (Right: Breast)  Patient Location: PACU  Anesthesia Type:General  Level of Consciousness: awake, alert , oriented, and patient cooperative  Airway & Oxygen Therapy: Patient Spontanous Breathing and Patient connected to face mask oxygen  Post-op Assessment: Report given to RN, Post -op Vital signs reviewed and stable, and Patient moving all extremities X 4  Post vital signs: Reviewed and stable  Last Vitals:  Vitals Value Taken Time  BP 83/50 07/23/23 1034  Temp 36.5 C 07/23/23 1034  Pulse 73 07/23/23 1037  Resp 19 07/23/23 1038  SpO2 100 % 07/23/23 1037  Vitals shown include unfiled device data.  Last Pain:  Vitals:   07/23/23 0815  PainSc: 0-No pain         Complications: No notable events documented.

## 2023-07-23 NOTE — Interval H&P Note (Signed)
 History and Physical Interval Note:  07/23/2023 8:45 AM  Sandra Cuevas  has presented today for surgery, with the diagnosis of MALIGNANT NEOPLASM RIGHT BREAST ER POSITIVE.  The various methods of treatment have been discussed with the patient and family. After consideration of risks, benefits and other options for treatment, the patient has consented to  Procedure(s): MASTECTOMY, SIMPLE (Right) as a surgical intervention.  The patient's history has been reviewed, patient examined, no change in status, stable for surgery.  I have reviewed the patient's chart and labs.  Questions were answered to the patient's satisfaction.     [name]

## 2023-07-23 NOTE — Plan of Care (Signed)

## 2023-07-24 ENCOUNTER — Encounter (HOSPITAL_COMMUNITY): Payer: Self-pay | Admitting: General Surgery

## 2023-07-24 LAB — CBC
HCT: 25.6 % — ABNORMAL LOW (ref 36.0–46.0)
Hemoglobin: 7.7 g/dL — ABNORMAL LOW (ref 12.0–15.0)
MCH: 30.7 pg (ref 26.0–34.0)
MCHC: 30.1 g/dL (ref 30.0–36.0)
MCV: 102 fL — ABNORMAL HIGH (ref 80.0–100.0)
Platelets: 156 10*3/uL (ref 150–400)
RBC: 2.51 MIL/uL — ABNORMAL LOW (ref 3.87–5.11)
RDW: 14.4 % (ref 11.5–15.5)
WBC: 10.4 10*3/uL (ref 4.0–10.5)
nRBC: 0 % (ref 0.0–0.2)

## 2023-07-24 LAB — BASIC METABOLIC PANEL WITH GFR
Anion gap: 7 (ref 5–15)
BUN: 20 mg/dL (ref 8–23)
CO2: 35 mmol/L — ABNORMAL HIGH (ref 22–32)
Calcium: 8.9 mg/dL (ref 8.9–10.3)
Chloride: 98 mmol/L (ref 98–111)
Creatinine, Ser: 0.95 mg/dL (ref 0.44–1.00)
GFR, Estimated: 56 mL/min — ABNORMAL LOW (ref >60)
Glucose, Bld: 152 mg/dL — ABNORMAL HIGH (ref 70–99)
Potassium: 4.6 mmol/L (ref 3.5–5.1)
Sodium: 140 mmol/L (ref 135–145)

## 2023-07-24 NOTE — Progress Notes (Signed)
 1 Day Post-Op  Subjective: Patient has no particular complaints.  Minimal incisional pain.  Objective: Vital signs in last 24 hours: Temp:  [97.7 F (36.5 C)-99.2 F (37.3 C)] 97.7 F (36.5 C) (04/17 1315) Pulse Rate:  [66-82] 74 (04/17 1315) Resp:  [18-20] 18 (04/16 1900) BP: (134-178)/(49-68) 162/56 (04/17 1315) SpO2:  [90 %-97 %] 93 % (04/17 1315) Weight:  [93.4 kg] 93.4 kg (04/16 1900) Last BM Date : 07/22/23  Intake/Output from previous day: 04/16 0701 - 04/17 0700 In: 1647 [P.O.:120; I.V.:1527] Out: 485 [Urine:400; Drains:60; Blood:25] Intake/Output this shift: Total I/O In: 240 [P.O.:240] Out: -   General appearance: alert, cooperative, and no distress Resp: clear to auscultation bilaterally Breasts: Right mastectomy incision line healing well.  No hematoma present.  Serosanguineous drainage noted. Cardio: regular rate and rhythm, S1, S2 normal, no murmur, click, rub or gallop  Lab Results:  Recent Labs    07/22/23 1119 07/24/23 0427  WBC 7.9 10.4  HGB 8.6* 7.7*  HCT 27.9* 25.6*  PLT 155 156   BMET Recent Labs    07/22/23 1119 07/24/23 0427  NA 139 140  K 4.3 4.6  CL 95* 98  CO2 37* 35*  GLUCOSE 172* 152*  BUN 15 20  CREATININE 0.86 0.95  CALCIUM 9.5 8.9   PT/INR No results for input(s): "LABPROT", "INR" in the last 72 hours.  Studies/Results: No results found.  Anti-infectives: Anti-infectives (From admission, onward)    Start     Dose/Rate Route Frequency Ordered Stop   07/23/23 0745  ceFAZolin (ANCEF) IVPB 2g/100 mL premix        2 g 200 mL/hr over 30 Minutes Intravenous On call to O.R. 07/23/23 4098 07/23/23 0933       Assessment/Plan: s/p Procedure(s): MASTECTOMY, SIMPLE Impression: Stable on postop day 1.  Patient did have a small drop in her hemoglobin, though she was anemic before the surgery.  This was to be expected.  This was secondary to surgical blood loss. Plan: Will monitor hemoglobin over the next 24 hours.  Anticipate  discharge in next 24 to 48 hours.  LOS: 1 day    [name] 07/24/2023

## 2023-07-24 NOTE — Plan of Care (Signed)

## 2023-07-25 LAB — CBC
HCT: 25.2 % — ABNORMAL LOW (ref 36.0–46.0)
Hemoglobin: 7.6 g/dL — ABNORMAL LOW (ref 12.0–15.0)
MCH: 31.3 pg (ref 26.0–34.0)
MCHC: 30.2 g/dL (ref 30.0–36.0)
MCV: 103.7 fL — ABNORMAL HIGH (ref 80.0–100.0)
Platelets: 163 10*3/uL (ref 150–400)
RBC: 2.43 MIL/uL — ABNORMAL LOW (ref 3.87–5.11)
RDW: 14.7 % (ref 11.5–15.5)
WBC: 10.5 10*3/uL (ref 4.0–10.5)
nRBC: 0.3 % — ABNORMAL HIGH (ref 0.0–0.2)

## 2023-07-25 LAB — BASIC METABOLIC PANEL WITH GFR
Anion gap: 5 (ref 5–15)
BUN: 22 mg/dL (ref 8–23)
CO2: 35 mmol/L — ABNORMAL HIGH (ref 22–32)
Calcium: 9.2 mg/dL (ref 8.9–10.3)
Chloride: 98 mmol/L (ref 98–111)
Creatinine, Ser: 1.06 mg/dL — ABNORMAL HIGH (ref 0.44–1.00)
GFR, Estimated: 49 mL/min — ABNORMAL LOW (ref >60)
Glucose, Bld: 141 mg/dL — ABNORMAL HIGH (ref 70–99)
Potassium: 4.4 mmol/L (ref 3.5–5.1)
Sodium: 138 mmol/L (ref 135–145)

## 2023-07-25 LAB — SURGICAL PATHOLOGY

## 2023-07-25 MED ORDER — FERROUS SULFATE 325 (65 FE) MG PO TBEC: 325.0000 mg | DELAYED_RELEASE_TABLET | Freq: Two times a day (BID) | ORAL | 11 refills | Status: DC

## 2023-07-25 NOTE — Plan of Care (Signed)

## 2023-07-25 NOTE — Discharge Summary (Signed)
 Physician Discharge Summary  Patient ID: Sandra Cuevas MRN: [id number] DOB/AGE: 1928-05-23 89 y.o.  Admit date: 07/23/2023 Discharge date: 07/25/2023  Admission Diagnoses: Invasive carcinoma of right breast  Discharge Diagnoses:  Principal Problem:   S/P mastectomy, right Acute on chronic anemia Home O2, COPD Discharged Condition: good  Hospital Course: Patient is a 89 year old white female with invasive carcinoma of the right breast who underwent a right simple mastectomy on 07/23/2023.  She tolerated the surgery well.  Her postoperative course was for the most part unremarkable.  She did have some left knee pain but this resolved during her admission.  Her diet was advanced out difficulty.  Her JP drainage has been low.  Final pathology still pending.  She did have acute on chronic anemia secondary to surgical blood loss.  She is being discharged home on 07/25/2023 in good and improving condition.  Treatments: surgery: Right simple mastectomy on 07/23/2023  Discharge Exam: Blood pressure (!) 141/52, pulse 82, temperature 98 F (36.7 C), resp. rate 20, height 5\' 3"  (1.6 m), weight 93.4 kg, SpO2 91%. General appearance: alert, cooperative, and no distress Resp: clear to auscultation bilaterally Breasts: Right mastectomy incision healing well without hematoma.  JP drain in place. Cardio: regular rate and rhythm, S1, S2 normal, no murmur, click, rub or gallop  Disposition: Discharge disposition: 01-Home or Self Care       Discharge Instructions     Diet - low sodium heart healthy   Complete by: As directed    Increase activity slowly   Complete by: As directed       Allergies as of 07/25/2023   No Known Allergies      Medication List     TAKE these medications    acetaminophen  500 MG tablet Commonly known as: TYLENOL  Take 2 tablets (1,000 mg total) by mouth every 6 (six) hours as needed.   albuterol  (2.5 MG/3ML) 0.083% nebulizer solution Commonly known as:  PROVENTIL  Take 3 mLs (2.5 mg total) by nebulization every 6 (six) hours as needed for wheezing or shortness of breath.   amLODipine  10 MG tablet Commonly known as: NORVASC  Take 1 tablet (10 mg total) by mouth daily.   anastrozole  1 MG tablet Commonly known as: ARIMIDEX  Take 1 tablet (1 mg total) by mouth daily.   atorvastatin  40 MG tablet Commonly known as: LIPITOR TAKE 1 TABLET(40 MG) BY MOUTH DAILY AT 6 PM   calcium  carbonate 1500 (600 Ca) MG Tabs tablet Commonly known as: OSCAL Take 600 mg by mouth at bedtime.   docusate sodium  100 MG capsule Commonly known as: COLACE Take 100 mg by mouth at bedtime.   famotidine  20 MG tablet Commonly known as: PEPCID  Take 1 tablet (20 mg total) by mouth 2 (two) times daily. What changed:  when to take this Another medication with the same name was removed. Continue taking this medication, and follow the directions you see here.   ferrous sulfate  325 (65 FE) MG EC tablet Take 1 tablet (325 mg total) by mouth 2 (two) times daily.   fluticasone  50 MCG/ACT nasal spray Commonly known as: FLONASE  SHAKE LIQUID AND USE 2 SPRAYS IN EACH NOSTRIL DAILY What changed: See the new instructions.   furosemide  20 MG tablet Commonly known as: LASIX  Take 20 mg by mouth daily as needed for fluid.   gabapentin  600 MG tablet Commonly known as: NEURONTIN  Take 600 mg by mouth 3 (three) times daily.   HM Eye Allergy Itch/Red Relief 0.1 % ophthalmic solution Generic  drug: olopatadine Place 1-2 drops into both eyes 2 (two) times daily as needed for allergies.   ibuprofen 200 MG tablet Commonly known as: ADVIL Take 200 mg by mouth at bedtime.   levothyroxine  50 MCG tablet Commonly known as: SYNTHROID  Take 50 mcg by mouth daily before breakfast.   montelukast  10 MG tablet Commonly known as: SINGULAIR  Take 10 mg by mouth in the morning.   multivitamin with minerals Tabs tablet Take 1 tablet by mouth at bedtime.   omeprazole  20 MG  capsule Commonly known as: PRILOSEC TAKE 1 CAPSULE(20 MG) BY MOUTH DAILY   polyethylene glycol 17 g packet Commonly known as: MIRALAX  / GLYCOLAX  Take 17 g by mouth daily as needed (constipation).   potassium chloride  10 MEQ tablet Commonly known as: KLOR-CON  Take 10 mEq by mouth in the morning.   PRESERVISION AREDS 2+MULTI VIT PO Take 1 tablet by mouth in the morning.   VITAMIN D3 PO Take 2,000 Units by mouth at bedtime.   zolpidem  5 MG tablet Commonly known as: AMBIEN  Take 5 mg by mouth at bedtime as needed for sleep.        Follow-up Information     [name], MD. Schedule an appointment as soon as possible for a visit on 07/31/2023.   Specialty: General Surgery Why: Call office Monday for follow up appointment Contact information: [id number] [place] [address] [phone]                 Signed: [name] 07/25/2023, 10:48 AM

## 2023-07-25 NOTE — Care Management Important Message (Signed)
 Important Message  Patient Details  Name: Sandra Cuevas MRN: [id number] Date of Birth: [birth year]   Important Message Given:  N/A - LOS <3 / Initial given by admissions     Sandra Cuevas 07/25/2023, 10:49 AM

## 2023-07-29 ENCOUNTER — Telehealth: Payer: Self-pay | Admitting: *Deleted

## 2023-07-29 NOTE — Telephone Encounter (Signed)
 Surgical Date: 07/23/2023 Procedure: MASTECTOMY, SIMPLE, RIGHT  Received call from patient daughter Leward Record [phone] telephone.  Reports that patient is removing binder as it is uncomfortable and bunching up in places. Advised that binder may be removed for bathing or repositioning. Advised that compression is needed to assist with healing.   Verbalized understanding.

## 2023-07-31 ENCOUNTER — Encounter: Payer: Self-pay | Admitting: General Surgery

## 2023-07-31 ENCOUNTER — Ambulatory Visit (INDEPENDENT_AMBULATORY_CARE_PROVIDER_SITE_OTHER): Admitting: General Surgery

## 2023-07-31 VITALS — BP 175/67 | HR 87 | Temp 97.9°F | Resp 16 | Ht 63.0 in | Wt 206.0 lb

## 2023-07-31 DIAGNOSIS — Z09 Encounter for follow-up examination after completed treatment for conditions other than malignant neoplasm: Secondary | ICD-10-CM

## 2023-07-31 NOTE — Progress Notes (Signed)
 Subjective:     Sandra Cuevas  Patient here for postoperative visit, status post right simple mastectomy.  She is doing well.  She has no incisional pain.  Her JP drainage has been minimal. Objective:    BP (!) 175/67   Pulse 87   Temp 97.9 F (36.6 C) (Oral)   Resp 16   Ht 5\' 3"  (1.6 m)   Wt 206 lb (93.4 kg)   SpO2 91%   BMI 36.49 kg/m   General:  alert, cooperative, and no distress  Right mastectomy incision healing well.  No hematoma present.  Minimal ecchymosis present.  JP drain removed. Final pathology discussed with patient.  Margins are clear.     Assessment:    Doing well postoperatively.    Plan:   Follow-up here in 1 week for staple removal.

## 2023-08-07 ENCOUNTER — Ambulatory Visit (INDEPENDENT_AMBULATORY_CARE_PROVIDER_SITE_OTHER): Admitting: General Surgery

## 2023-08-07 ENCOUNTER — Encounter: Payer: Self-pay | Admitting: General Surgery

## 2023-08-07 VITALS — BP 167/77 | HR 79 | Temp 98.2°F | Resp 16 | Ht 63.0 in | Wt 200.0 lb

## 2023-08-07 DIAGNOSIS — Z09 Encounter for follow-up examination after completed treatment for conditions other than malignant neoplasm: Secondary | ICD-10-CM

## 2023-08-07 NOTE — Progress Notes (Signed)
 Subjective:     Sandra Cuevas  Patient here for follow-up wound check.  She is doing well.  She has no complaints. Objective:    BP (!) 167/77   Pulse 79   Temp 98.2 F (36.8 C) (Oral)   Resp 16   Ht 5\' 3"  (1.6 m)   Wt 200 lb (90.7 kg)   SpO2 90%   BMI 35.43 kg/m   General:  alert, cooperative, and no distress  Right mastectomy incision healing well.  No hematoma or ecchymosis present.  Staples were removed.     Assessment:    Doing well postoperatively.    Plan:   Follow-up with Dr. Cheree Cords of oncology next week.  Follow-up here as needed.

## 2023-08-12 NOTE — Progress Notes (Signed)
 Sandra Cuevas [address] [place] [address]    Clinic Day:  08/13/2023  Referring physician: Minus Amel, MD  Patient Care Team: Minus Amel, MD as PCP - General (Family Medicine) Nahser, Lela Purple, MD as PCP - Cardiology (Cardiology) Paulett Boros, MD as Medical Oncologist (Medical Oncology) Gerhard Knuckles, RN as Oncology Nurse Navigator (Medical Oncology)   ASSESSMENT & PLAN:   Assessment: 1.  Stage I (T2 N0 M0 G2 ER/PR+ HER2-) right breast LIQ invasive mammary carcinoma: - CT angiogram chest (04/14/2023): Incidental 17 mm nodular area in the inferior right breast.  Stable mediastinal and right hilar adenopathy.  New nodular densities in the left lower lobe measuring up to 5 mm. - Diagnostic mammogram and ultrasound (07/03/2023): Breast mass measuring 1.9 x 1.4 x 1.9 cm in the right breast at 5 o'clock position.  Ultrasound right axilla with multiple normal-appearing lymph nodes. - Pathology: Invasive mammary carcinoma with extracellular mucin, overall grade 2, HER2 (0), ER 100% strong positive, PR 80% moderate positive, Ki-67 5% - She had history of cervical cancer, s/p TAH and her early 30s - Right simple mastectomy on 07/23/2023 by Dr. Larrie Po - Pathology: 3 cm IDC, grade 2, margins negative, negative LVI/PNI - Anastrozole  started on 07/15/2023, switch to tamoxifen 20 mg daily on 08/13/2023 due to osteoporosis.   2. Social/Family History: -Lives at home alone, ambulates with walker. Independent of ADL's and is able to cook independently. She relies on her daughter for other IADL's.  No tobacco use.  Reports 2-3 falls in the last 1 and half years. -Sister had bone cancer. Brother had leukemia. Other brother had cancer, type unknown.     Plan: 1.  Stage I (T2 N0 M0 G2 ER/PR+ HER2-) right breast LIQ invasive mammary carcinoma: - Right mastectomy site is well-healed. - We discussed the pathology report in detail which showed 3 cm mass showing coagulative  type necrosis.  She started taking anastrozole  1 week prior to her surgery. - No indication for radiation given the size of tumor and her age.  Hence I did not make referral. - She will continue antiestrogen therapy for at least 5 years.  Because of her osteoporosis, I will switch her anastrozole  to tamoxifen daily.  New prescription was sent. - Recommend follow-up in 6 months with repeat labs and exam.   2.  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 - Labs on 07/15/2023: Ferritin 193, percent saturation 17.  B12 was normal but methylmalonic acid was elevated at 393.  Copper  and folic acid  were normal. - We will give her B12 injection today.  We will start her on B12 1 mg tablet daily.   3.  Osteoporosis: - Reviewed DEXA scan from 07/22/2023 indicating osteoporosis with T-score -2.5. - Vitamin D  level was normal at 71.  Continue vitamin D  2000 units daily and calcium  supplements. - We discussed role of bisphosphonates including both oral (Fosamax) and parenteral zoledronic acid (Reclast).  We discussed side effects in detail.  She is planning to have root canal and crown placed. - She will see her dentist.  She will think about the medications and let us  know at next visit.    No orders of the defined types were placed in this encounter.     I,Katie Daubenspeck,acting as a Neurosurgeon for Paulett Boros, MD.,have documented all relevant documentation on the behalf of Paulett Boros, MD,as directed by  Paulett Boros, MD while in the presence of Paulett Boros, MD.  I, Paulett Boros MD, have reviewed the above documentation for accuracy and completeness, and I agree with the above.   Paulett Boros, MD   5/7/202512:49 PM  CHIEF COMPLAINT:   Diagnosis: right breast cancer, ER+/HER2-   Cancer Staging  Breast cancer of lower-inner quadrant of right female breast [facility]) Staging form: Breast, AJCC 8th Edition - Pathologic stage from 08/13/2023: Stage Unknown  (pT2, pNX, cM0, G2, ER+, PR+, HER2-) - Signed by Paulett Boros, MD on 08/13/2023    Prior Therapy: right mastectomy, 07/23/23  Current Therapy: Tamoxifen   HISTORY OF PRESENT ILLNESS:   Oncology History  Breast cancer of lower-inner quadrant of right female breast (HCC)  07/15/2023 Initial Diagnosis   Breast cancer of lower-inner quadrant of right female breast (HCC)   08/13/2023 Cancer Staging   Staging form: Breast, AJCC 8th Edition - Pathologic stage from 08/13/2023: Stage Unknown (pT2, pNX, cM0, G2, ER+, PR+, HER2-) - Signed by Paulett Boros, MD on 08/13/2023 Histopathologic type: Infiltrating duct carcinoma, NOS Stage prefix: Initial diagnosis Nuclear grade: G2 Multigene prognostic tests performed: None Histologic grading system: 3 grade system Residual tumor (R): R0      INTERVAL HISTORY:   Sandra Cuevas is a 89 y.o. female presenting to clinic today for follow up of right breast cancer, ER+/HER2-. She was last seen by me on 07/15/23 in consultation.  Since her last visit, she underwent mastectomy on 07/23/23 under Dr. Larrie Po. Pathology showed: 3 cm residual invasive ductal carcinoma, grade 2, with most of tumor showing coagulative type necrosis; resection margins negative.  She also underwent DEXA scan on 07/22/23 showing a T-score of -2.5 in left femoral neck, which is considered borderline osteoporotic.  Today, she states that she is doing well overall. Her appetite level is at 100%. Her energy level is at 50%.  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 "   COPD (chronic obstructive pulmonary disease) (HCC)    Diabetes mellitus    GERD (gastroesophageal reflux disease)    History of cardiovascular stress test 06/01/2010   EF 71% - no evidence of ischemia, normal left ventricular systolic function   History of hysterectomy 1965    Hyperlipidemia    Hypertension    Hypothyroidism    Shortness of breath    on exertion   Tubular adenoma of colon     Surgical History: Past Surgical History:  Procedure Laterality Date   ABDOMINAL HYSTERECTOMY     BREAST BIOPSY Right 07/08/2023   path pending   BREAST BIOPSY Right 07/08/2023   US  RT BREAST BX W LOC DEV 1ST LESION IMG BX SPEC US  GUIDE 07/08/2023 Alger Infield, MD [facility]-ULTRASOUND   CARDIAC SURGERY     catherization   HEMORRHOID SURGERY     TOTAL MASTECTOMY Right 07/23/2023   Procedure: MASTECTOMY, SIMPLE;  Surgeon: [name], MD;  Location: [facility];  Service: General;  Laterality: Right;   TOTAL SHOULDER REPLACEMENT Right     Social History: Social History   Socioeconomic History   Marital status: Widowed    Spouse name: Not on file   Number of children: 2   Years of education: 8   Highest education level: Not on file  Occupational History   Occupation: Retired  Tobacco Use   Smoking status: Never   Smokeless tobacco: Never  Vaping Use   Vaping status: Never Used  Substance and Sexual Activity   Alcohol  use: No   Drug use: Never   Sexual activity: Not Currently    Birth control/protection: Surgical    Comment: hyst  Other Topics Concern   Not on file  Social History Narrative   Right handed   Tea daily   Lives alone   Social Drivers of Health   Financial Resource Strain: Low Risk  (07/13/2020)   Overall Financial Resource Strain (CARDIA)    Difficulty of Paying Living Expenses: Not very hard  Food Insecurity: No Food Insecurity (07/23/2023)   Hunger Vital Sign    Worried About Running Out of Food in the Last Year: Never true    Ran Out of Food in the Last Year: Never true  Transportation Needs: No Transportation Needs (07/23/2023)   PRAPARE - Administrator, Civil Service (Medical): No    Lack of Transportation (Non-Medical): No  Physical Activity: Inactive (07/13/2020)   Exercise Vital Sign    Days of Exercise per Week: 0 days     Minutes of Exercise per Session: 0 min  Stress: No Stress Concern Present (07/13/2020)   [employer] of Occupational Health - Occupational Stress Questionnaire    Feeling of Stress : Only a little  Social Connections: Moderately Integrated (07/23/2023)   Social Connection and Isolation Panel [NHANES]    Frequency of Communication with Friends and Family: More than three times a week    Frequency of Social Gatherings with Friends and Family: More than three times a week    Attends Religious Services: More than 4 times per year    Active Member of [employer] or Organizations: Yes    Attends Banker Meetings: More than 4 times per year    Marital Status: Widowed  Intimate Partner Violence: Not At Risk (07/23/2023)   Humiliation, Afraid, Rape, and Kick questionnaire    Fear of Current or Ex-Partner: No    Emotionally Abused: No    Physically Abused: No    Sexually Abused: No    Family History: Family History  Problem Relation Age of Onset   Heart disease Father        heart problems   Kidney failure Mother    Diabetes Mother    Leukemia Brother    Diabetes Brother    Colon cancer Sister    Leukemia Brother    Diabetes Brother    Pancreatic cancer Brother    Bone cancer Sister     Current Medications:  Current Outpatient Medications:    tamoxifen (NOLVADEX) 20 MG tablet, Take 1 tablet (20 mg total) by mouth daily., Disp: 90 tablet, Rfl: 2   acetaminophen  (TYLENOL ) 500 MG tablet, Take 2 tablets (1,000 mg total) by mouth every 6 (six) hours as needed., Disp: 30 tablet, Rfl: 0   albuterol  (PROVENTIL ) (2.5 MG/3ML) 0.083% nebulizer solution, Take 3 mLs (2.5 mg total) by nebulization every 6 (six) hours as needed for wheezing or shortness of breath., Disp: 75 mL, Rfl: 0   amLODipine  (NORVASC ) 10 MG tablet, Take 1 tablet (10 mg total) by mouth daily., Disp: 30 tablet, Rfl: 0   atorvastatin  (LIPITOR) 40 MG tablet, TAKE 1 TABLET(40 MG) BY MOUTH DAILY AT 6 PM, Disp: 90  tablet, Rfl: 3   calcium  carbonate (OSCAL) 1500 (600 Ca) MG TABS tablet, Take 600 mg by mouth at bedtime., Disp: , Rfl:    Cholecalciferol (VITAMIN D3 PO), Take 2,000 Units by  mouth at bedtime., Disp: , Rfl:    docusate sodium  (COLACE) 100 MG capsule, Take 100 mg by mouth at bedtime., Disp: , Rfl:    famotidine  (PEPCID ) 20 MG tablet, Take 1 tablet (20 mg total) by mouth 2 (two) times daily. (Patient taking differently: Take 20 mg by mouth daily.), Disp: 180 tablet, Rfl: 1   ferrous sulfate  325 (65 FE) MG EC tablet, Take 1 tablet (325 mg total) by mouth 2 (two) times daily., Disp: 60 tablet, Rfl: 11   fluticasone  (FLONASE ) 50 MCG/ACT nasal spray, SHAKE LIQUID AND USE 2 SPRAYS IN EACH NOSTRIL DAILY (Patient taking differently: Place 2 sprays into both nostrils daily as needed for allergies.), Disp: 16 g, Rfl: 5   furosemide  (LASIX ) 20 MG tablet, Take 20 mg by mouth daily as needed for fluid., Disp: , Rfl:    gabapentin  (NEURONTIN ) 600 MG tablet, Take 600 mg by mouth 3 (three) times daily., Disp: , Rfl:    ibuprofen (ADVIL) 200 MG tablet, Take 200 mg by mouth at bedtime., Disp: , Rfl:    levothyroxine  (SYNTHROID , LEVOTHROID) 50 MCG tablet, Take 50 mcg by mouth daily before breakfast., Disp: , Rfl:    montelukast  (SINGULAIR ) 10 MG tablet, Take 10 mg by mouth in the morning., Disp: , Rfl:    Multiple Vitamin (MULTIVITAMIN WITH MINERALS) TABS tablet, Take 1 tablet by mouth at bedtime., Disp: , Rfl:    Multiple Vitamins-Minerals (PRESERVISION AREDS 2+MULTI VIT PO), Take 1 tablet by mouth in the morning., Disp: , Rfl:    olopatadine (HM EYE ALLERGY ITCH/RED RELIEF) 0.1 % ophthalmic solution, Place 1-2 drops into both eyes 2 (two) times daily as needed for allergies., Disp: , Rfl:    omeprazole  (PRILOSEC) 20 MG capsule, TAKE 1 CAPSULE(20 MG) BY MOUTH DAILY, Disp: 90 capsule, Rfl: 3   polyethylene glycol (MIRALAX  / GLYCOLAX ) 17 g packet, Take 17 g by mouth daily as needed (constipation)., Disp: , Rfl:     potassium chloride  (KLOR-CON ) 10 MEQ tablet, Take 10 mEq by mouth in the morning., Disp: , Rfl:    zolpidem  (AMBIEN ) 5 MG tablet, Take 5 mg by mouth at bedtime as needed for sleep., Disp: , Rfl:    Allergies: No Known Allergies  REVIEW OF SYSTEMS:   Review of Systems  Constitutional:  Negative for chills, fatigue and fever.  HENT:   Negative for lump/mass, mouth sores, nosebleeds, sore throat and trouble swallowing.   Eyes:  Negative for eye problems.  Respiratory:  Negative for cough and shortness of breath.   Cardiovascular:  Negative for chest pain, leg swelling and palpitations.  Gastrointestinal:  Negative for abdominal pain, constipation, diarrhea, nausea and vomiting.  Genitourinary:  Negative for bladder incontinence, difficulty urinating, dysuria, frequency, hematuria and nocturia.   Musculoskeletal:  Positive for back pain. Negative for arthralgias, flank pain, myalgias and neck pain.  Skin:  Negative for itching and rash.  Neurological:  Positive for headaches and numbness. Negative for dizziness.  Hematological:  Does not bruise/bleed easily.  Psychiatric/Behavioral:  Positive for sleep disturbance. Negative for depression and suicidal ideas. The patient is not nervous/anxious.   All other systems reviewed and are negative.    VITALS:   Blood pressure (!) 156/62, pulse 65, temperature (!) 97.5 F (36.4 C), temperature source Tympanic, resp. rate 18, height 5\' 5"  (1.651 m), weight 197 lb (89.4 kg), SpO2 95%.  Wt Readings from Last 3 Encounters:  08/13/23 197 lb (89.4 kg)  08/07/23 200 lb (90.7 kg)  07/31/23 206  lb (93.4 kg)    Body mass index is 32.78 kg/m.  Performance status (ECOG): 1 - Symptomatic but completely ambulatory  PHYSICAL EXAM:   Physical Exam Vitals and nursing note reviewed. Exam conducted with a chaperone present.  Constitutional:      Appearance: Normal appearance.  Cardiovascular:     Rate and Rhythm: Normal rate and regular rhythm.      Pulses: Normal pulses.     Heart sounds: Normal heart sounds.  Pulmonary:     Effort: Pulmonary effort is normal.     Breath sounds: Normal breath sounds.  Abdominal:     Palpations: Abdomen is soft. There is no hepatomegaly, splenomegaly or mass.     Tenderness: There is no abdominal tenderness.  Musculoskeletal:     Right lower leg: No edema.     Left lower leg: No edema.  Lymphadenopathy:     Cervical: No cervical adenopathy.     Right cervical: No superficial, deep or posterior cervical adenopathy.    Left cervical: No superficial, deep or posterior cervical adenopathy.     Upper Body:     Right upper body: No supraclavicular or axillary adenopathy.     Left upper body: No supraclavicular or axillary adenopathy.  Neurological:     General: No focal deficit present.     Mental Status: She is alert and oriented to person, place, and time.  Psychiatric:        Mood and Affect: Mood normal.        Behavior: Behavior normal.     LABS:   CBC     Component Value Date/Time   WBC 10.5 07/25/2023 0501   RBC 2.43 (L) 07/25/2023 0501   HGB 7.6 (L) 07/25/2023 0501   HCT 25.2 (L) 07/25/2023 0501   PLT 163 07/25/2023 0501   MCV 103.7 (H) 07/25/2023 0501   MCH 31.3 07/25/2023 0501   MCHC 30.2 07/25/2023 0501   RDW 14.7 07/25/2023 0501   LYMPHSABS 1.1 07/22/2023 1119   MONOABS 1.1 (H) 07/22/2023 1119   EOSABS 0.1 07/22/2023 1119   BASOSABS 0.0 07/22/2023 1119    CMP      Component Value Date/Time   NA 138 07/25/2023 0501   NA 143 12/05/2022 1038   K 4.4 07/25/2023 0501   CL 98 07/25/2023 0501   CO2 35 (H) 07/25/2023 0501   GLUCOSE 141 (H) 07/25/2023 0501   BUN 22 07/25/2023 0501   BUN 14 12/05/2022 1038   CREATININE 1.06 (H) 07/25/2023 0501   CREATININE 1.22 (H) 04/18/2015 1022   CALCIUM  9.2 07/25/2023 0501   PROT 6.3 (L) 07/22/2023 1119   PROT 6.2 12/21/2021 0805   ALBUMIN 2.9 (L) 07/22/2023 1119   ALBUMIN 3.8 12/21/2021 0805   AST 24 07/22/2023 1119   ALT 17  07/22/2023 1119   ALKPHOS 76 07/22/2023 1119   BILITOT 0.8 07/22/2023 1119   BILITOT 0.5 12/21/2021 0805   GFRNONAA 49 (L) 07/25/2023 0501   GFRAA 48 (L) 08/12/2019 0253     No results found for: "CEA1", "CEA" / No results found for: "CEA1", "CEA" No results found for: "PSA1" No results found for: "ZOX096" No results found for: "CAN125"  No results found for: "TOTALPROTELP", "ALBUMINELP", "A1GS", "A2GS", "BETS", "BETA2SER", "GAMS", "MSPIKE", "SPEI" Lab Results  Component Value Date   TIBC 273 07/15/2023   TIBC 226 (L) 09/22/2009   FERRITIN 193 07/15/2023   FERRITIN 309 (H) 09/22/2009   IRONPCTSAT 17 07/15/2023   IRONPCTSAT 12 (L) 09/22/2009  Lab Results  Component Value Date   LDH 133 07/15/2023     STUDIES:   DG Bone Density Result Date: 07/23/2023 EXAM: DUAL X-RAY ABSORPTIOMETRY (DXA) FOR BONE MINERAL DENSITY 07/22/2023 1:02 pm CLINICAL DATA:  89 year old Female Postmenopausal. Screening for osteoporosis History of fragility fracture. Patient is or has been on glucocorticoid therapy. TECHNIQUE: An axial (e.g., hips, spine) and/or appendicular (e.g., radius) exam was performed, as appropriate, using GE Psychologist, sport and exercise at [facility] Images are obtained for bone mineral density measurement and are not obtained for diagnostic purposes. [id number] Exclusions: Lumbar spine COMPARISON:  10/28/2016 FINDINGS: Scan quality: Good. LEFT FEMORAL NECK: BMD (in g/cm2): 0.697 T-score: -2.5 Z-score: 0.3 Rate of change from previous exam: -9.1 % LEFT TOTAL HIP: BMD (in g/cm2): 0.747 T-score: -2.1 Z-score: 0.6 RIGHT FEMORAL NECK: BMD (in g/cm2): 0.723 T-score: -2.3 Z-score: 0.5 RIGHT TOTAL HIP: BMD (in g/cm2): 0.779 T-score: -1.8 Z-score: 0.9 DUAL-FEMUR TOTAL MEAN: Rate of change from previous exam: -4.5 % RIGHT FOREARM (RADIUS 33%): BMD (in g/cm2): 0.695 T-score: -0.3 Z-score: 3.6 Rate of change from previous exam: No significant rate of change from previous exam. FRAX 10-YEAR  PROBABILITY OF FRACTURE: FRAX not reported as the patient's age is outside the parameters of the FRAX risk tool used by this practice. IMPRESSION: Osteoporosis based on BMD. Fracture risk is increased. Increased risk is based on low BMD, and history of fragility fracture. RECOMMENDATIONS: 1. All patients should optimize calcium  and vitamin D  intake. 2. Consider FDA-approved medical therapies in postmenopausal women and men aged 9 years and older, based on the following: - A hip or vertebral (clinical or morphometric) fracture - T-score less than or equal to -2.5 and secondary causes have been excluded. - Low bone mass (T-score between -1.0 and -2.5) and a 10-year probability of a hip fracture greater than or equal to 3% or a 10-year probability of a major osteoporosis-related fracture greater than or equal to 20% based on the [place] -adapted WHO algorithm. - Clinician judgment and/or patient preferences may indicate treatment for people with 10-year fracture probabilities above or below these levels 3. Patients with diagnosis of osteoporosis or at high risk for fracture should have regular bone mineral density tests. For patients eligible for Medicare, routine testing is allowed once every 2 years. The testing frequency can be increased to one year for patients who have rapidly progressing disease, those who are receiving or discontinuing medical therapy to restore bone mass, or have additional risk factors. Electronically Signed   By: Dina  Arceo M.D.   On: 07/23/2023 07:18   Chest 2 View Result Date: 07/22/2023 CLINICAL DATA:  Breast cancer. EXAM: CHEST - 2 VIEW COMPARISON:  Chest CT 04/14/2023, radiograph 04/18/2023 FINDINGS: Patchy airspace disease in the right lower lung zone persists. The right upper lobe opacities on prior CT are not well-defined by radiograph small right pleural effusion has improved from prior exam. Trace left pleural effusion. Stable heart size and mediastinal contours, aortic  atherosclerosis. No pneumothorax. Reverse right shoulder arthroplasty. IMPRESSION: 1. Patchy airspace disease in the right lower lung zone persists. The right upper lobe opacities on prior CT are not well-defined by radiograph. 2. Small right pleural effusion has improved from prior exam. Trace left pleural effusion. Electronically Signed   By: Chadwick Colonel M.D.   On: 07/22/2023 21:41

## 2023-08-13 ENCOUNTER — Inpatient Hospital Stay

## 2023-08-13 ENCOUNTER — Inpatient Hospital Stay: Attending: Hematology | Admitting: Hematology

## 2023-08-13 VITALS — BP 156/62 | HR 65 | Temp 97.5°F | Resp 18 | Ht 65.0 in | Wt 197.0 lb

## 2023-08-13 DIAGNOSIS — Z90710 Acquired absence of both cervix and uterus: Secondary | ICD-10-CM | POA: Diagnosis not present

## 2023-08-13 DIAGNOSIS — M810 Age-related osteoporosis without current pathological fracture: Secondary | ICD-10-CM | POA: Insufficient documentation

## 2023-08-13 DIAGNOSIS — Z1721 Progesterone receptor positive status: Secondary | ICD-10-CM | POA: Insufficient documentation

## 2023-08-13 DIAGNOSIS — R590 Localized enlarged lymph nodes: Secondary | ICD-10-CM | POA: Diagnosis not present

## 2023-08-13 DIAGNOSIS — Z800 Family history of malignant neoplasm of digestive organs: Secondary | ICD-10-CM | POA: Diagnosis not present

## 2023-08-13 DIAGNOSIS — Z1732 Human epidermal growth factor receptor 2 negative status: Secondary | ICD-10-CM | POA: Diagnosis not present

## 2023-08-13 DIAGNOSIS — D631 Anemia in chronic kidney disease: Secondary | ICD-10-CM | POA: Insufficient documentation

## 2023-08-13 DIAGNOSIS — Z170 Estrogen receptor positive status [ER+]: Secondary | ICD-10-CM | POA: Insufficient documentation

## 2023-08-13 DIAGNOSIS — Z9011 Acquired absence of right breast and nipple: Secondary | ICD-10-CM | POA: Diagnosis not present

## 2023-08-13 DIAGNOSIS — Z808 Family history of malignant neoplasm of other organs or systems: Secondary | ICD-10-CM | POA: Diagnosis not present

## 2023-08-13 DIAGNOSIS — Z79810 Long term (current) use of selective estrogen receptor modulators (SERMs): Secondary | ICD-10-CM | POA: Diagnosis not present

## 2023-08-13 DIAGNOSIS — C50311 Malignant neoplasm of lower-inner quadrant of right female breast: Secondary | ICD-10-CM | POA: Insufficient documentation

## 2023-08-13 DIAGNOSIS — N189 Chronic kidney disease, unspecified: Secondary | ICD-10-CM | POA: Diagnosis not present

## 2023-08-13 DIAGNOSIS — Z806 Family history of leukemia: Secondary | ICD-10-CM | POA: Diagnosis not present

## 2023-08-13 DIAGNOSIS — E538 Deficiency of other specified B group vitamins: Secondary | ICD-10-CM

## 2023-08-13 DIAGNOSIS — Z8541 Personal history of malignant neoplasm of cervix uteri: Secondary | ICD-10-CM | POA: Diagnosis not present

## 2023-08-13 MED ORDER — TAMOXIFEN CITRATE 20 MG PO TABS: 20.0000 mg | ORAL_TABLET | Freq: Every day | ORAL | 2 refills | Status: DC

## 2023-08-13 MED ORDER — CYANOCOBALAMIN 1000 MCG/ML IJ SOLN
1000.0000 ug | Freq: Once | INTRAMUSCULAR | Status: AC
  Administered 2023-08-13: 1000 ug via INTRAMUSCULAR
  Filled 2023-08-13: qty 1

## 2023-08-13 NOTE — Progress Notes (Signed)
 Patient seen by Dr Cheree Cords today. B12 injection ordered. Patient tolerated injection in left deltoid with no complaints voiced.  Site clean and dry with no bruising or swelling noted.  No complaints of pain.  Discharged with vital signs stable and no signs or symptoms of distress noted.

## 2023-08-13 NOTE — Patient Instructions (Addendum)
 [facility] at [facility] Discharge Instructions   You were seen and examined today by Dr. Cheree Cords.  He reviewed the results of your lab work which are normal/stable.   He reviewed the results of your bone density test which is showing you have osteoporosis. Continue calcium  and vitamin D  as you have been taking.   Dr. Linnell Richardson would also like you take B12 tablets daily. The dose of the B12 is 1 mg or 1000 mcg. We will also give you a one time B12 injection today.   Stop the anastrozole . We will send in a prescription for tamoxifen in its place. Take as prescribed.   We will see you back in .   Return as scheduled.    Thank you for choosing [facility] at [facility] to provide your oncology and hematology care.  To afford each patient quality time with our provider, please arrive at least 15 minutes before your scheduled appointment time.   If you have a lab appointment with the Cancer Center please come in thru the Main Entrance and check in at the main information desk.  You need to re-schedule your appointment should you arrive 10 or more minutes late.  We strive to give you quality time with our providers, and arriving late affects you and other patients whose appointments are after yours.  Also, if you no show three or more times for appointments you may be dismissed from the clinic at the providers discretion.     Again, thank you for choosing [facility].  Our hope is that these requests will decrease the amount of time that you wait before being seen by our physicians.       _____________________________________________________________  Should you have questions after your visit to [facility], please contact our office at [phone] and follow the prompts.  Our office hours are 8:00 a.m. and 4:30 p.m. Monday - Friday.  Please note that voicemails left after 4:00 p.m. may not be returned until the following  business day.  We are closed weekends and major holidays.  You do have access to a nurse 24-7, just call the main number to the clinic [phone] and do not press any options, hold on the line and a nurse will answer the phone.    For prescription refill requests, have your pharmacy contact our office and allow 72 hours.    Due to Covid, you will need to wear a mask upon entering the hospital. If you do not have a mask, a mask will be given to you at the Main Entrance upon arrival. For doctor visits, patients may have 1 support person age 43 or older with them. For treatment visits, patients can not have anyone with them due to social distancing guidelines and our immunocompromised population.

## 2023-08-13 NOTE — Patient Instructions (Signed)
 [facility] [place] - A DEPT OF [facility]  Discharge Instructions: Thank you for choosing  Cancer Center to provide your oncology and hematology care.  If you have a lab appointment with the Cancer Center - please note that after April 8th, 2024, all labs will be drawn in the cancer center.  You do not have to check in or register with the main entrance as you have in the past but will complete your check-in in the cancer center.  Wear comfortable clothing and clothing appropriate for easy access to any Portacath or PICC line.   We strive to give you quality time with your provider. You may need to reschedule your appointment if you arrive late (15 or more minutes).  Arriving late affects you and other patients whose appointments are after yours.  Also, if you miss three or more appointments without notifying the office, you may be dismissed from the clinic at the provider's discretion.      For prescription refill requests, have your pharmacy contact our office and allow 72 hours for refills to be completed.    Today you received the following B12.  Vitamin B12 Injection What is this medication? Vitamin B12 (VAHY tuh min B12) prevents and treats low vitamin B12 levels in your body. It is used in people who do not get enough vitamin B12 from their diet or when their digestive tract does not absorb enough. Vitamin B12 plays an important role in maintaining the health of your nervous system and red blood cells. This medicine may be used for other purposes; ask your health care provider or pharmacist if you have questions. COMMON BRAND NAME(S): B-12 Compliance Kit, B-12 Injection Kit, Cyomin, Dodex, LA-12, Nutri-Twelve, Physicians EZ Use B-12, Primabalt, Vitamin Deficiency Injectable System - B12 What should I tell my care team before I take this medication? They need to know if you have any of these conditions: Kidney disease Leber's disease Megaloblastic  anemia An unusual or allergic reaction to cyanocobalamin, cobalt, other medications, foods, dyes, or preservatives Pregnant or trying to get pregnant Breast-feeding How should I use this medication? This medication is injected into a muscle or deeply under the skin. It is usually given in a clinic or care team's office. However, your care team may teach you how to inject yourself. Follow all instructions. Talk to your care team about the use of this medication in children. Special care may be needed. Overdosage: If you think you have taken too much of this medicine contact a poison control center or emergency room at once. NOTE: This medicine is only for you. Do not share this medicine with others. What if I miss a dose? If you are given your dose at a clinic or care team's office, call to reschedule your appointment. If you give your own injections, and you miss a dose, take it as soon as you can. If it is almost time for your next dose, take only that dose. Do not take double or extra doses. What may interact with this medication? Alcohol Colchicine This list may not describe all possible interactions. Give your health care provider a list of all the medicines, herbs, non-prescription drugs, or dietary supplements you use. Also tell them if you smoke, drink alcohol, or use illegal drugs. Some items may interact with your medicine. What should I watch for while using this medication? Visit your care team regularly. You may need blood work done while you are taking this  medication. You may need to follow a special diet. Talk to your care team. Limit your alcohol intake and avoid smoking to get the best benefit. What side effects may I notice from receiving this medication? Side effects that you should report to your care team as soon as possible: Allergic reactions--skin rash, itching, hives, swelling of the face, lips, tongue, or throat Swelling of the ankles, hands, or feet Trouble  breathing Side effects that usually do not require medical attention (report to your care team if they continue or are bothersome): Diarrhea This list may not describe all possible side effects. Call your doctor for medical advice about side effects. You may report side effects to FDA at 1-800-FDA-1088. Where should I keep my medication? Keep out of the reach of children. Store at room temperature between 15 and 30 degrees C (59 and 85 degrees F). Protect from light. Throw away any unused medication after the expiration date. NOTE: This sheet is a summary. It may not cover all possible information. If you have questions about this medicine, talk to your doctor, pharmacist, or health care provider.  2024 Elsevier/Gold Standard (2020-12-05 00:00:00)   To help prevent nausea and vomiting after your treatment, we encourage you to take your nausea medication as directed.  BELOW ARE SYMPTOMS THAT SHOULD BE REPORTED IMMEDIATELY: *FEVER GREATER THAN 100.4 F (38 C) OR HIGHER *CHILLS OR SWEATING *NAUSEA AND VOMITING THAT IS NOT CONTROLLED WITH YOUR NAUSEA MEDICATION *UNUSUAL SHORTNESS OF BREATH *UNUSUAL BRUISING OR BLEEDING *URINARY PROBLEMS (pain or burning when urinating, or frequent urination) *BOWEL PROBLEMS (unusual diarrhea, constipation, pain near the anus) TENDERNESS IN MOUTH AND THROAT WITH OR WITHOUT PRESENCE OF ULCERS (sore throat, sores in mouth, or a toothache) UNUSUAL RASH, SWELLING OR PAIN  UNUSUAL VAGINAL DISCHARGE OR ITCHING   Items with * indicate a potential emergency and should be followed up as soon as possible or go to the Emergency Department if any problems should occur.  Please show the CHEMOTHERAPY ALERT CARD or IMMUNOTHERAPY ALERT CARD at check-in to the Emergency Department and triage nurse.  Should you have questions after your visit or need to cancel or reschedule your appointment, please contact [facility] [place] - A DEPT OF [facility]  [phone]  and follow the prompts.  Office hours are 8:00 a.m. to 4:30 p.m. Monday - Friday. Please note that voicemails left after 4:00 p.m. may not be returned until the following business day.  We are closed weekends and major holidays. You have access to a nurse at all times for urgent questions. Please call the main number to the clinic [phone] and follow the prompts.  For any non-urgent questions, you may also contact your provider using MyChart. We now offer e-Visits for anyone 76 and older to request care online for non-urgent symptoms. For details visit [email or web address].   Also download the MyChart app! Go to the app store, search "MyChart", open the app, select [place], and log in with your MyChart username and password.

## 2023-08-20 DIAGNOSIS — J449 Chronic obstructive pulmonary disease, unspecified: Secondary | ICD-10-CM | POA: Diagnosis not present

## 2023-08-21 ENCOUNTER — Encounter (INDEPENDENT_AMBULATORY_CARE_PROVIDER_SITE_OTHER): Payer: PPO | Admitting: Ophthalmology

## 2023-08-27 ENCOUNTER — Other Ambulatory Visit (HOSPITAL_COMMUNITY): Payer: Self-pay | Admitting: Family Medicine

## 2023-08-27 DIAGNOSIS — R911 Solitary pulmonary nodule: Secondary | ICD-10-CM

## 2023-09-15 ENCOUNTER — Ambulatory Visit (HOSPITAL_COMMUNITY)
Admission: RE | Admit: 2023-09-15 | Discharge: 2023-09-15 | Disposition: A | Discharge status: Home / Self Care | Source: Ambulatory Visit | Attending: Family Medicine | Admitting: Family Medicine

## 2023-09-15 DIAGNOSIS — R918 Other nonspecific abnormal finding of lung field: Secondary | ICD-10-CM | POA: Diagnosis not present

## 2023-09-15 DIAGNOSIS — R911 Solitary pulmonary nodule: Secondary | ICD-10-CM | POA: Insufficient documentation

## 2023-09-15 DIAGNOSIS — R590 Localized enlarged lymph nodes: Secondary | ICD-10-CM | POA: Diagnosis not present

## 2023-09-19 ENCOUNTER — Other Ambulatory Visit: Payer: Self-pay | Admitting: Cardiovascular Disease

## 2023-09-19 DIAGNOSIS — E785 Hyperlipidemia, unspecified: Secondary | ICD-10-CM

## 2023-09-20 DIAGNOSIS — J449 Chronic obstructive pulmonary disease, unspecified: Secondary | ICD-10-CM | POA: Diagnosis not present

## 2023-11-18 DIAGNOSIS — Z0001 Encounter for general adult medical examination with abnormal findings: Secondary | ICD-10-CM | POA: Diagnosis not present

## 2023-11-18 DIAGNOSIS — E6609 Other obesity due to excess calories: Secondary | ICD-10-CM | POA: Diagnosis not present

## 2023-11-18 DIAGNOSIS — Z6832 Body mass index (BMI) 32.0-32.9, adult: Secondary | ICD-10-CM | POA: Diagnosis not present

## 2023-11-18 DIAGNOSIS — F5101 Primary insomnia: Secondary | ICD-10-CM | POA: Diagnosis not present

## 2023-11-18 DIAGNOSIS — E1122 Type 2 diabetes mellitus with diabetic chronic kidney disease: Secondary | ICD-10-CM | POA: Diagnosis not present

## 2023-11-18 DIAGNOSIS — I5030 Unspecified diastolic (congestive) heart failure: Secondary | ICD-10-CM | POA: Diagnosis not present

## 2023-11-18 DIAGNOSIS — E785 Hyperlipidemia, unspecified: Secondary | ICD-10-CM | POA: Diagnosis not present

## 2023-11-18 DIAGNOSIS — I110 Hypertensive heart disease with heart failure: Secondary | ICD-10-CM | POA: Diagnosis not present

## 2023-11-18 DIAGNOSIS — Z1331 Encounter for screening for depression: Secondary | ICD-10-CM | POA: Diagnosis not present

## 2023-11-18 DIAGNOSIS — I10 Essential (primary) hypertension: Secondary | ICD-10-CM | POA: Diagnosis not present

## 2023-12-18 ENCOUNTER — Encounter: Payer: Self-pay | Admitting: Emergency Medicine

## 2023-12-18 ENCOUNTER — Ambulatory Visit (INDEPENDENT_AMBULATORY_CARE_PROVIDER_SITE_OTHER): Admitting: Emergency Medicine

## 2023-12-18 VITALS — BP 151/71 | HR 83 | Ht 65.0 in | Wt 200.0 lb

## 2023-12-18 DIAGNOSIS — J4489 Other specified chronic obstructive pulmonary disease: Secondary | ICD-10-CM | POA: Diagnosis not present

## 2023-12-18 DIAGNOSIS — R053 Chronic cough: Secondary | ICD-10-CM

## 2023-12-18 DIAGNOSIS — J9611 Chronic respiratory failure with hypoxia: Secondary | ICD-10-CM

## 2023-12-18 DIAGNOSIS — R911 Solitary pulmonary nodule: Secondary | ICD-10-CM

## 2023-12-18 DIAGNOSIS — R918 Other nonspecific abnormal finding of lung field: Secondary | ICD-10-CM | POA: Diagnosis not present

## 2023-12-18 MED ORDER — AZITHROMYCIN 250 MG PO TABS: ORAL_TABLET | ORAL | 0 refills | Status: DC

## 2023-12-18 NOTE — Assessment & Plan Note (Signed)
 Desaturation noted when she was admitted for pneumonia in early 2025.  She is on 2-3 L/min.  We could consider a repeat walking oximetry at some point going forward if we believe she no longer requires.  For now I would like for her to continue it.

## 2023-12-18 NOTE — Assessment & Plan Note (Signed)
 Acute on chronic active cough, exacerbating factors GERD and rhinitis.  Treating both.  She may have a superimposed acute bronchitis.  I think it is reasonable to treat her with azithromycin  to see if she gets benefit.   Please continue your Singulair  each evening as you have been taking it Continue your fluticasone  nasal spray, 2 sprays each nostril once daily. Continue to take your omeprazole  and your famotidine  for reflux as you have been doing Okay to continue your Robitussin as needed Keep albuterol  available to use 2 puffs or 1 nebulizer treatment if needed for shortness of breath, chest tightness or wheezing. Please take azithromycin  as directed until completely gone.

## 2023-12-18 NOTE — Assessment & Plan Note (Signed)
 Overall stable.  Her principal issue is cough, mucus production and mucus burden.  Minimal wheezing.  Minimal albuterol  use.

## 2023-12-18 NOTE — Progress Notes (Signed)
   Subjective:    Patient ID: Sandra Cuevas, female    DOB: 13-May-1928, 89 y.o.   MRN: [id number]  HPI  ROV 12/18/2023 --89 year old woman whom I have followed for suspected fixed asthma/COPD, upper airway irritation syndrome with associated chronic cough.  Exacerbating factors include GERD with a hiatal hernia, probably also chronic allergic rhinitis.  She has a chronic hoarse voice.  Her PFT were remote, done in 2015, and showed moderate obstruction. She underwent R breat CA surgery in 07/2023, on tamoxifen . She had been doing pretty well until she had a URI and then a CAP, was admitted to [facility] and was d/c to home on O2 at 2-3L/min. She has been dealing with more cough and mucous production,  Singulair , fluticasone  nasal spray, omeprazole , Pepcid , robitussin prn.  Albuterol  as needed - about 1x a month  Ct chest 09/15/23 reviewed, shows   Review of Systems As per HPI      Objective:   Physical Exam Vitals:   12/18/23 1017  BP: (!) 151/71  Pulse: 83  SpO2: 93%  Weight: 200 lb (90.7 kg)  Height: 5' 5 (1.651 m)  PF: (!) 2 L/min    Gen: Pleasant, well-nourished, in no distress,  normal affect  ENT: No lesions,  mouth clear,  oropharynx clear, very hoarse gravelly voice  Neck: No JVD, no stridor but she is hoarse.   Lungs: No use of accessory muscles, clear without rales or rhonchi, no wheeze.   Cardiovascular: RRR, heart sounds normal, no murmur or gallops, no peripheral edema  Musculoskeletal: No deformities  Neuro: alert, non focal  Skin: Warm, no lesions or rashes      Assessment & Plan:  Pulmonary nodules Pulmonary nodules on her most recent imaging in the aftermath of her pneumonia but also importantly in the setting of her newly diagnosed breast cancer.  Repeat has been planned for 78-month interval.  I will follow-up with her to review.  Given her age, comorbidities we will have to strongly consider the risks, benefits of diagnostics, biopsy depending on the CT  results.  Chronic obstructive airway disease with asthma (HCC) Overall stable.  Her principal issue is cough, mucus production and mucus burden.  Minimal wheezing.  Minimal albuterol  use.  Chronic respiratory failure with hypoxia (HCC) Desaturation noted when she was admitted for pneumonia in early 2025.  She is on 2-3 L/min.  We could consider a repeat walking oximetry at some point going forward if we believe she no longer requires.  For now I would like for her to continue it.  Cough Acute on chronic active cough, exacerbating factors GERD and rhinitis.  Treating both.  She may have a superimposed acute bronchitis.  I think it is reasonable to treat her with azithromycin  to see if she gets benefit.   Please continue your Singulair  each evening as you have been taking it Continue your fluticasone  nasal spray, 2 sprays each nostril once daily. Continue to take your omeprazole  and your famotidine  for reflux as you have been doing Okay to continue your Robitussin as needed Keep albuterol  available to use 2 puffs or 1 nebulizer treatment if needed for shortness of breath, chest tightness or wheezing. Please take azithromycin  as directed until completely gone.  Time spent 45 minutes discussing symptoms, history, recent testing including CT scan chest, plans for diagnostics and treatment.  Lamar Chris, MD, PhD 12/18/2023, 10:42 AM [facility] [phone] or if no answer [phone]

## 2023-12-18 NOTE — Patient Instructions (Signed)
 Agree with repeat CT scan of the chest in September/October 2025 to follow small pulmonary nodules for stability. Please continue your Singulair  each evening as you have been taking it Continue your fluticasone  nasal spray, 2 sprays each nostril once daily. Continue to take your omeprazole  and your famotidine  for reflux as you have been doing Okay to continue your Robitussin as needed Keep albuterol  available to use 2 puffs or 1 nebulizer treatment if needed for shortness of breath, chest tightness or wheezing. Please take azithromycin  as directed until completely gone. Follow Dr. Shelah in October so we can review your CT scan.

## 2023-12-18 NOTE — Assessment & Plan Note (Signed)
 Pulmonary nodules on her most recent imaging in the aftermath of her pneumonia but also importantly in the setting of her newly diagnosed breast cancer.  Repeat has been planned for 40-month interval.  I will follow-up with her to review.  Given her age, comorbidities we will have to strongly consider the risks, benefits of diagnostics, biopsy depending on the CT results.

## 2023-12-24 ENCOUNTER — Telehealth: Payer: Self-pay | Admitting: Cardiovascular Disease

## 2023-12-24 DIAGNOSIS — E785 Hyperlipidemia, unspecified: Secondary | ICD-10-CM

## 2023-12-24 MED ORDER — ATORVASTATIN CALCIUM 40 MG PO TABS: ORAL_TABLET | ORAL | 0 refills | Status: DC

## 2023-12-24 NOTE — Telephone Encounter (Signed)
 The patient's daughter Angeline was notified the patient's refill has been sent to the pharmacy and to make sure she keeps her scheduled appointment for future refills.

## 2023-12-24 NOTE — Telephone Encounter (Signed)
*  STAT* If patient is at the pharmacy, call can be transferred to refill team.   1. Which medications need to be refilled? (please list name of each medication and dose if known)    atorvastatin  (LIPITOR) 40 MG tablet    4. Which pharmacy/location (including street and city if local pharmacy) is medication to be sent to?  [id number] - [place], [place] - [address] AT [facility] OF [address] & [address]     5. Do they need a 30 day or 90 day supply? 90  Scheduled 9/23

## 2023-12-29 NOTE — Progress Notes (Signed)
 Cardiology Office Note   Date:  12/30/2023  ID:  Sandra Cuevas, DOB 02/18/1929, MRN [id number] PCP: Marvine Rush, MD  [facility] Providers Cardiologist:  Emeline FORBES Calender, MD     History of Present Illness Sandra Cuevas is a 89 y.o. female with a past medical history of hypertension, PVCs, HFpEF, CAD, aortic stenosis, mitral stenosis, mesenteric artery stenosis, type 2 diabetes, CKD 3, atypical chest pain hyperlipidemia, carotid artery disease, right-sided breast cancer who presents today for 1 year follow-up.  She denies any chest pain, palpitations or shortness of breath.  Denies any upper extremity discomfort.  She states she has fallen in the past but does not sound like she falls frequently.  Ambulates with a walker and a rollator.  Denies any bleeding issues.     ROS:  Review of Systems  All other systems reviewed and are negative.   Physical Exam  Physical Exam Vitals and nursing note reviewed.  Constitutional:      Appearance: Normal appearance.  HENT:     Head: Normocephalic and atraumatic.  Eyes:     Conjunctiva/sclera: Conjunctivae normal.  Neck:     Vascular: Carotid bruit present.     Comments: Left supraclavicular bruit Cardiovascular:     Rate and Rhythm: Normal rate and regular rhythm.  Pulmonary:     Effort: Pulmonary effort is normal.     Breath sounds: Normal breath sounds.  Musculoskeletal:        General: No swelling or tenderness.  Skin:    Coloration: Skin is not jaundiced or pale.  Neurological:     Mental Status: She is alert.     VS:  BP (!) 142/66   Pulse 72   Ht 5' 5 (1.651 m)   Wt 201 lb 6.4 oz (91.4 kg)   SpO2 90%   BMI 33.51 kg/m         Wt Readings from Last 3 Encounters:  12/30/23 201 lb 6.4 oz (91.4 kg)  12/18/23 200 lb (90.7 kg)  08/13/23 197 lb (89.4 kg)     EKG Interpretation Date/Time:  Tuesday December 30 2023 08:40:27 EDT Ventricular Rate:  70 PR Interval:  172 QRS Duration:  154 QT  Interval:  456 QTC Calculation: 492 R Axis:   101  Text Interpretation: Normal sinus rhythm Right bundle branch block When compared with ECG of 14-Apr-2023 19:35, No significant change since Confirmed by Calender Emeline [phone]) on 12/30/2023 8:55:36 AM    Studies Reviewed   Echocardiogram 04/19/2023: EF 60-65 % with no regional wall motion normalities Mild LVH Grade 2 diastolic dysfunction Severely dilated left atrium Mild mitral stenosis with severe MAC Mild aortic stenosis  Bilateral carotid duplex 11/19/2022: Moderate bilateral carotid disease Left subclavian artery stenosis with 94 mmHg pressure gradient between arms   Risk Assessment/Calculations      ASSESSMENT  Chronic diastolic heart failure euvolemic.  On Lasix  20 mg as needed Hypertension currently on amlodipine  10 mg Diabetes Moderate bilateral carotid artery disease  Left subclavian artery stenosis on atorvastatin .  Not on aspirin  Bilateral carotid bruit auscultated Left supraclavicular bruit Hyperlipidemia on atorvastatin  40 mg Mild aortic stenosis Mild mitral stenosis   Plan  Start aspirin  81 mg Continue statin Will check a bilateral carotid and subclavian duplex Vascular surgery referral to establish care.  Patient states she tolerated breast surgery last year and is willing to undergo surgery if needed. Echo in 1 year  Follow up: 1 year pending above workup  Signed, Emeline FORBES Calender, MD

## 2023-12-30 ENCOUNTER — Encounter: Payer: Self-pay | Admitting: Internal Medicine

## 2023-12-30 ENCOUNTER — Ambulatory Visit: Attending: Internal Medicine | Admitting: Internal Medicine

## 2023-12-30 VITALS — BP 142/66 | HR 72 | Ht 65.0 in | Wt 201.4 lb

## 2023-12-30 DIAGNOSIS — I350 Nonrheumatic aortic (valve) stenosis: Secondary | ICD-10-CM

## 2023-12-30 DIAGNOSIS — E782 Mixed hyperlipidemia: Secondary | ICD-10-CM

## 2023-12-30 DIAGNOSIS — I5032 Chronic diastolic (congestive) heart failure: Secondary | ICD-10-CM

## 2023-12-30 DIAGNOSIS — I10 Essential (primary) hypertension: Secondary | ICD-10-CM | POA: Diagnosis not present

## 2023-12-30 DIAGNOSIS — I342 Nonrheumatic mitral (valve) stenosis: Secondary | ICD-10-CM | POA: Diagnosis not present

## 2023-12-30 DIAGNOSIS — I771 Stricture of artery: Secondary | ICD-10-CM | POA: Diagnosis not present

## 2023-12-30 DIAGNOSIS — R0989 Other specified symptoms and signs involving the circulatory and respiratory systems: Secondary | ICD-10-CM

## 2023-12-30 DIAGNOSIS — I779 Disorder of arteries and arterioles, unspecified: Secondary | ICD-10-CM

## 2023-12-30 MED ORDER — ASPIRIN 81 MG PO TBEC: 81.0000 mg | DELAYED_RELEASE_TABLET | Freq: Every day | ORAL | Status: AC

## 2023-12-30 NOTE — Patient Instructions (Addendum)
 Medication Instructions:  START Aspirin  81 mg daily   *If you need a refill on your cardiac medications before your next appointment, please call your pharmacy*  Testing/Procedures: Echocardiogram in 1 year   Your physician has requested that you have an echocardiogram. Echocardiography is a painless test that uses sound waves to create images of your heart. It provides your doctor with information about the size and shape of your heart and how well your heart's chambers and valves are working. This procedure takes approximately one hour. There are no restrictions for this procedure. Please do NOT wear cologne, perfume, aftershave, or lotions (deodorant is allowed). Please arrive 15 minutes prior to your appointment time.  Please note: We ask at that you not bring children with you during ultrasound (echo/ vascular) testing. Due to room size and safety concerns, children are not allowed in the ultrasound rooms during exams. Our front office staff cannot provide observation of children in our lobby area while testing is being conducted. An adult accompanying a patient to their appointment will only be allowed in the ultrasound room at the discretion of the ultrasound technician under special circumstances. We apologize for any inconvenience.  Carotid duplex  Your physician has requested that you have a carotid duplex. This test is an ultrasound of the carotid arteries in your neck. It looks at blood flow through these arteries that supply the brain with blood. Allow one hour for this exam. There are no restrictions or special instructions.   REFERRAL TO VASCULAR SURGERY   Follow-Up: At [facility] you and your health needs are our priority.  As part of our continuing mission to provide you with exceptional heart care, our providers are all part of one team.  This team includes your primary Cardiologist (physician) and Advanced Practice Providers or APPs (Physician Assistants and Nurse  Practitioners) who all work together to provide you with the care you need, when you need it.  Your next appointment:   6 month(s)  Provider:   Emeline FORBES Calender, MD

## 2024-01-10 ENCOUNTER — Emergency Department (HOSPITAL_COMMUNITY)

## 2024-01-10 ENCOUNTER — Other Ambulatory Visit: Payer: Self-pay

## 2024-01-10 ENCOUNTER — Emergency Department (HOSPITAL_COMMUNITY)
Admission: EM | Admit: 2024-01-10 | Discharge: 2024-01-10 | Disposition: A | Discharge status: Home / Self Care | Attending: Emergency Medicine | Admitting: Emergency Medicine

## 2024-01-10 DIAGNOSIS — W19XXXA Unspecified fall, initial encounter: Secondary | ICD-10-CM

## 2024-01-10 DIAGNOSIS — S0990XA Unspecified injury of head, initial encounter: Secondary | ICD-10-CM | POA: Diagnosis present

## 2024-01-10 DIAGNOSIS — Z7982 Long term (current) use of aspirin: Secondary | ICD-10-CM | POA: Insufficient documentation

## 2024-01-10 DIAGNOSIS — S0101XA Laceration without foreign body of scalp, initial encounter: Secondary | ICD-10-CM | POA: Diagnosis not present

## 2024-01-10 DIAGNOSIS — M438X6 Other specified deforming dorsopathies, lumbar region: Secondary | ICD-10-CM | POA: Diagnosis not present

## 2024-01-10 DIAGNOSIS — S0003XA Contusion of scalp, initial encounter: Secondary | ICD-10-CM | POA: Diagnosis not present

## 2024-01-10 DIAGNOSIS — R531 Weakness: Secondary | ICD-10-CM | POA: Insufficient documentation

## 2024-01-10 DIAGNOSIS — W1839XA Other fall on same level, initial encounter: Secondary | ICD-10-CM | POA: Diagnosis not present

## 2024-01-10 DIAGNOSIS — Z043 Encounter for examination and observation following other accident: Secondary | ICD-10-CM | POA: Diagnosis not present

## 2024-01-10 DIAGNOSIS — M160 Bilateral primary osteoarthritis of hip: Secondary | ICD-10-CM | POA: Diagnosis not present

## 2024-01-10 DIAGNOSIS — M5136 Other intervertebral disc degeneration, lumbar region with discogenic back pain only: Secondary | ICD-10-CM | POA: Diagnosis not present

## 2024-01-10 DIAGNOSIS — M50222 Other cervical disc displacement at C5-C6 level: Secondary | ICD-10-CM | POA: Diagnosis not present

## 2024-01-10 DIAGNOSIS — M47812 Spondylosis without myelopathy or radiculopathy, cervical region: Secondary | ICD-10-CM | POA: Diagnosis not present

## 2024-01-10 DIAGNOSIS — M48061 Spinal stenosis, lumbar region without neurogenic claudication: Secondary | ICD-10-CM | POA: Diagnosis not present

## 2024-01-10 DIAGNOSIS — M47816 Spondylosis without myelopathy or radiculopathy, lumbar region: Secondary | ICD-10-CM | POA: Diagnosis not present

## 2024-01-10 DIAGNOSIS — I6782 Cerebral ischemia: Secondary | ICD-10-CM | POA: Diagnosis not present

## 2024-01-10 LAB — URINALYSIS, ROUTINE W REFLEX MICROSCOPIC
Bilirubin Urine: NEGATIVE
Glucose, UA: NEGATIVE mg/dL
Hgb urine dipstick: NEGATIVE
Ketones, ur: NEGATIVE mg/dL
Leukocytes,Ua: NEGATIVE
Nitrite: NEGATIVE
Protein, ur: NEGATIVE mg/dL
Specific Gravity, Urine: 1.016 (ref 1.005–1.030)
pH: 5 (ref 5.0–8.0)

## 2024-01-10 LAB — COMPREHENSIVE METABOLIC PANEL WITH GFR
ALT: 16 U/L (ref 0–44)
AST: 25 U/L (ref 15–41)
Albumin: 3.7 g/dL (ref 3.5–5.0)
Alkaline Phosphatase: 84 U/L (ref 38–126)
Anion gap: 7 (ref 5–15)
BUN: 13 mg/dL (ref 8–23)
CO2: 34 mmol/L — ABNORMAL HIGH (ref 22–32)
Calcium: 8.8 mg/dL — ABNORMAL LOW (ref 8.9–10.3)
Chloride: 103 mmol/L (ref 98–111)
Creatinine, Ser: 0.81 mg/dL (ref 0.44–1.00)
GFR, Estimated: >60 mL/min (ref >60)
Glucose, Bld: 157 mg/dL — ABNORMAL HIGH (ref 70–99)
Potassium: 4.4 mmol/L (ref 3.5–5.1)
Sodium: 143 mmol/L (ref 135–145)
Total Bilirubin: 0.4 mg/dL (ref 0.0–1.2)
Total Protein: 6 g/dL — ABNORMAL LOW (ref 6.5–8.1)

## 2024-01-10 LAB — CBC WITH DIFFERENTIAL/PLATELET
Abs Immature Granulocytes: 0.08 K/uL — ABNORMAL HIGH (ref 0.00–0.07)
Basophils Absolute: 0 K/uL (ref 0.0–0.1)
Basophils Relative: 1 %
Eosinophils Absolute: 0.1 K/uL (ref 0.0–0.5)
Eosinophils Relative: 2 %
HCT: 31.2 % — ABNORMAL LOW (ref 36.0–46.0)
Hemoglobin: 9.8 g/dL — ABNORMAL LOW (ref 12.0–15.0)
Immature Granulocytes: 1 %
Lymphocytes Relative: 21 %
Lymphs Abs: 1.3 K/uL (ref 0.7–4.0)
MCH: 33.1 pg (ref 26.0–34.0)
MCHC: 31.4 g/dL (ref 30.0–36.0)
MCV: 105.4 fL — ABNORMAL HIGH (ref 80.0–100.0)
Monocytes Absolute: 0.7 K/uL (ref 0.1–1.0)
Monocytes Relative: 11 %
Neutro Abs: 4 K/uL (ref 1.7–7.7)
Neutrophils Relative %: 64 %
Platelets: 146 K/uL — ABNORMAL LOW (ref 150–400)
RBC: 2.96 MIL/uL — ABNORMAL LOW (ref 3.87–5.11)
RDW: 14.6 % (ref 11.5–15.5)
WBC: 6.3 K/uL (ref 4.0–10.5)
nRBC: 0 % (ref 0.0–0.2)

## 2024-01-10 LAB — TROPONIN T, HIGH SENSITIVITY
Troponin T High Sensitivity: 25 ng/L — ABNORMAL HIGH (ref 0–19)
Troponin T High Sensitivity: 26 ng/L — ABNORMAL HIGH (ref 0–19)

## 2024-01-10 MED ORDER — LIDOCAINE-EPINEPHRINE (PF) 2 %-1:200000 IJ SOLN
10.0000 mL | Freq: Once | INTRAMUSCULAR | Status: AC
  Administered 2024-01-10: 10 mL
  Filled 2024-01-10: qty 20

## 2024-01-10 MED ORDER — AMLODIPINE BESYLATE 5 MG PO TABS: 5.0000 mg | ORAL_TABLET | Freq: Every day | ORAL | 0 refills | Status: DC

## 2024-01-10 MED ORDER — SODIUM CHLORIDE 0.9 % IV BOLUS
500.0000 mL | Freq: Once | INTRAVENOUS | Status: AC
  Administered 2024-01-10: 500 mL via INTRAVENOUS

## 2024-01-10 MED ORDER — AMLODIPINE BESYLATE 5 MG PO TABS
10.0000 mg | ORAL_TABLET | Freq: Once | ORAL | Status: AC
  Administered 2024-01-10: 10 mg via ORAL
  Filled 2024-01-10: qty 2

## 2024-01-10 MED ORDER — ACETAMINOPHEN 325 MG PO TABS
650.0000 mg | ORAL_TABLET | Freq: Once | ORAL | Status: AC
  Administered 2024-01-10: 650 mg via ORAL
  Filled 2024-01-10: qty 2

## 2024-01-10 NOTE — ED Provider Notes (Signed)
 Pt signed out by Dr. Freddi pending hip and lumbar xrays.  These films are reviewed.  I agree with the radiologist.  Hip: No acute fracture or dislocation.  Lumbar:  No evidence of acute fracture of the lumbar spine.  2. Moderate diffuse degenerative disc disease and facet hypertrophy.   UA neg for UTI  Pt is able to ambulate without problems.  Pt was given her normal 10 mg amlodipine  in ED and BP dropped to the mid-80s.  Pt given ivfs and bp back to normal.  I am going to have pt decrease amlodipine  to 5 mg to see if that will make her feel less weak.  Pt is stable for d/c.  Return if worse.  F/u with pcp.   Dean Clarity, MD 01/10/24 [phone]

## 2024-01-10 NOTE — ED Provider Notes (Signed)
 [place] EMERGENCY DEPARTMENT AT [facility] Provider Note   CSN: [id number] Arrival date & time: 01/10/24  1046     Patient presents with: Fall   Sandra Cuevas is a 89 y.o. female.   HPI 89 year old female presents with a fall and head injury.  The patient has chronic issues getting weakness in her legs when she stands.  She normally uses a walker.  However typically even though her legs buckle she never actually falls.  This morning when trying to go to the bathroom she fell due to the leg weakness.  It was both legs that felt weak.  Her left leg chronically feels weaker than her right.  However this is not worse than typical.  No fevers, chest pain, shortness of breath, urinary symptoms.  No change in her meds though she did not take her meds this morning.  EMS reports she has a small laceration to her occipital scalp.  She is not on blood thinners.  She has been hypertensive for EMS.  Chart review shows last Tdap less than 5 years go.  Prior to Admission medications   Medication Sig Start Date End Date Taking? Authorizing Provider  acetaminophen  (TYLENOL ) 500 MG tablet Take 2 tablets (1,000 mg total) by mouth every 6 (six) hours as needed. 07/05/16   Armenta Canning, MD  albuterol  (PROVENTIL ) (2.5 MG/3ML) 0.083% nebulizer solution Take 3 mLs (2.5 mg total) by nebulization every 6 (six) hours as needed for wheezing or shortness of breath. 05/16/21   Rolinda Rogue, MD  amLODipine  (NORVASC ) 10 MG tablet Take 1 tablet (10 mg total) by mouth daily. 03/24/19   Pearlean Manus, MD  aspirin  EC 81 MG tablet Take 1 tablet (81 mg total) by mouth daily. Swallow whole. 12/30/23   Segal, Jared E, MD  atorvastatin  (LIPITOR) 40 MG tablet TAKE 1 TABLET(40 MG) BY MOUTH DAILY AT 6 PM 12/24/23   Lelon Hamilton T, PA-C  azithromycin  (ZITHROMAX ) 250 MG tablet Take 2 on the first day, then take 1 daily until completely gone. 12/18/23   Shelah Lamar RAMAN, MD  calcium  carbonate (OSCAL) 1500 (600 Ca) MG  TABS tablet Take 600 mg by mouth at bedtime.    [provider]  Cholecalciferol (VITAMIN D3 PO) Take 2,000 Units by mouth at bedtime.    [provider]  docusate sodium  (COLACE) 100 MG capsule Take 100 mg by mouth at bedtime. Patient not taking: Reported on 12/30/2023    [provider]  famotidine  (PEPCID ) 20 MG tablet Take 1 tablet (20 mg total) by mouth 2 (two) times daily. Patient not taking: Reported on 12/30/2023 03/27/22   Zehr, Jessica D, PA-C  ferrous sulfate  325 (65 FE) MG EC tablet Take 1 tablet (325 mg total) by mouth 2 (two) times daily. 07/25/23 07/24/24  Mavis Anes, MD  fluticasone  (FLONASE ) 50 MCG/ACT nasal spray SHAKE LIQUID AND USE 2 SPRAYS IN EACH NOSTRIL DAILY Patient taking differently: Place 2 sprays into both nostrils daily as needed for allergies. 12/07/18   Shelah Lamar RAMAN, MD  furosemide  (LASIX ) 20 MG tablet Take 20 mg by mouth daily as needed for fluid. 05/15/23   [provider]  gabapentin  (NEURONTIN ) 600 MG tablet Take 600 mg by mouth 3 (three) times daily. 08/05/19   [provider]  ibuprofen (ADVIL) 200 MG tablet Take 200 mg by mouth at bedtime. Patient not taking: Reported on 12/30/2023    [provider]  levothyroxine  (SYNTHROID , LEVOTHROID) 50 MCG tablet Take 50 mcg  by mouth daily before breakfast.    [provider]  montelukast  (SINGULAIR ) 10 MG tablet Take 10 mg by mouth in the morning. 05/16/23   [provider]  Multiple Vitamin (MULTIVITAMIN WITH MINERALS) TABS tablet Take 1 tablet by mouth at bedtime.    [provider]  Multiple Vitamins-Minerals (PRESERVISION AREDS 2+MULTI VIT PO) Take 1 tablet by mouth in the morning.    [provider]  olopatadine (HM EYE ALLERGY ITCH/RED RELIEF) 0.1 % ophthalmic solution Place 1-2 drops into both eyes 2 (two) times daily as needed for allergies.    [provider]  omeprazole  (PRILOSEC) 20 MG capsule TAKE 1 CAPSULE(20 MG) BY  MOUTH DAILY 06/23/23   Zehr, Jessica D, PA-C  polyethylene glycol (MIRALAX  / GLYCOLAX ) 17 g packet Take 17 g by mouth daily as needed (constipation).    [provider]  potassium chloride  (KLOR-CON ) 10 MEQ tablet Take 10 mEq by mouth in the morning. 07/03/23   [provider]  tamoxifen  (NOLVADEX ) 20 MG tablet Take 1 tablet (20 mg total) by mouth daily. 08/13/23   Rogers Hai, MD  zolpidem  (AMBIEN ) 5 MG tablet Take 5 mg by mouth at bedtime as needed for sleep.    [provider]    Allergies: Patient has no known allergies.    Review of Systems  Constitutional:  Negative for fever.  Respiratory:  Negative for shortness of breath.   Cardiovascular:  Negative for chest pain.  Musculoskeletal:  Positive for neck pain (left sided).  Neurological:  Positive for weakness and headaches.    Updated Vital Signs BP (!) 104/57   Pulse 76   Temp 97.9 F (36.6 C) (Oral)   Resp 11   Ht 5' 5 (1.651 m)   Wt 101.5 kg   SpO2 99%   BMI 37.23 kg/m   Physical Exam Vitals and nursing note reviewed.  Constitutional:      Appearance: She is well-developed.  HENT:     Head: Normocephalic. Laceration present.   Cardiovascular:     Rate and Rhythm: Normal rate and regular rhythm.     Pulses:          Dorsalis pedis pulses are 2+ on the right side.       Posterior tibial pulses are 2+ on the left side.     Heart sounds: Normal heart sounds.  Pulmonary:     Effort: Pulmonary effort is normal.     Breath sounds: Normal breath sounds.  Abdominal:     General: There is no distension.     Palpations: Abdomen is soft.  Skin:    General: Skin is warm and dry.  Neurological:     Mental Status: She is alert.     Comments: 5/5 strength in BUE. 3/5 strength in LLE. 4/5 strength in RLE (chronic lower extremity weakness baseline per patient).     (all labs ordered are listed, but only abnormal results are displayed) Labs Reviewed  COMPREHENSIVE METABOLIC PANEL WITH  GFR - Abnormal; Notable for the following components:      Result Value   CO2 34 (*)    Glucose, Bld 157 (*)    Calcium  8.8 (*)    Total Protein 6.0 (*)    All other components within normal limits  CBC WITH DIFFERENTIAL/PLATELET - Abnormal; Notable for the following components:   RBC 2.96 (*)    Hemoglobin 9.8 (*)    HCT 31.2 (*)    MCV 105.4 (*)  Platelets 146 (*)    Abs Immature Granulocytes 0.08 (*)    All other components within normal limits  TROPONIN T, HIGH SENSITIVITY - Abnormal; Notable for the following components:   Troponin T High Sensitivity 25 (*)    All other components within normal limits  TROPONIN T, HIGH SENSITIVITY - Abnormal; Notable for the following components:   Troponin T High Sensitivity 26 (*)    All other components within normal limits  URINALYSIS, ROUTINE W REFLEX MICROSCOPIC    EKG: EKG Interpretation Date/Time:  Saturday January 10 2024 11:32:52 EDT Ventricular Rate:  79 PR Interval:  187 QRS Duration:  143 QT Interval:  419 QTC Calculation: 481 R Axis:   113  Text Interpretation: Sinus rhythm RBBB and LPFB similar to Sept 2025 Confirmed by Freddi Hamilton [phone]) on 01/10/2024 11:37:01 AM  Radiology: CT Head Wo Contrast Result Date: 01/10/2024 CLINICAL DATA:  Provided history: Head trauma, minor. Neck trauma. Fall. Laceration at back of head. EXAM: CT HEAD WITHOUT CONTRAST CT CERVICAL SPINE WITHOUT CONTRAST TECHNIQUE: Multidetector CT imaging of the head and cervical spine was performed following the standard protocol without intravenous contrast. Multiplanar CT image reconstructions of the cervical spine were also generated. RADIATION DOSE REDUCTION: This exam was performed according to the departmental dose-optimization program which includes automated exposure control, adjustment of the mA and/or kV according to patient size and/or use of iterative reconstruction technique. COMPARISON:  Head CT 03/20/2019.  Cervical spine CT 03/20/2019.  FINDINGS: CT HEAD FINDINGS Brain: Mild generalized cerebral atrophy. Patchy and ill-defined hypoattenuation within the cerebral white matter, nonspecific but compatible with mild chronic small vessel ischemic disease. There is no acute intracranial hemorrhage. No demarcated cortical infarct. No extra-axial fluid collection. No evidence of an intracranial mass. No midline shift. Vascular: No hyperdense vessel.  Atherosclerotic calcifications. Skull: No calvarial fracture or aggressive osseous lesion. Sinuses/Orbits: No mass or acute finding within the imaged orbits. No significant paranasal sinus disease at the imaged levels. Other: Small left mastoid effusion.  Posterior scalp hematoma. CT CERVICAL SPINE FINDINGS Alignment: Nonspecific reversal of the expected cervical lordosis. 2 mm grade 1 anterolisthesis at C2-C3 and C3-C4. 3 mm C4-C5 grade 1 anterolisthesis. Slight C5-C6 grade 1 retrolisthesis. Skull base and vertebrae: The basion-dental and atlanto-dental intervals are maintained.No evidence of acute fracture to the cervical spine. Soft tissues and spinal canal: No prevertebral fluid or swelling. No visible canal hematoma. Disc levels: Cervical spondylosis with multilevel disc space narrowing, disc bulges/central disc protrusions, uncovertebral hypertrophy and facet arthropathy. No appreciable high-grade spinal canal stenosis. Multilevel bony neural foraminal narrowing. Upper chest: No consolidation within the imaged lung apices. Biapical pleuroparenchymal scarring. Other: Calcified atherosclerotic plaque within the proximal major branch vessels of the neck and carotid arteries. IMPRESSION: CT head: 1.  No evidence of an acute intracranial abnormality. 2. Parenchymal atrophy and chronic small vessel ischemic disease. 3. Posterior scalp hematoma. 4. Small left mastoid effusion. CT cervical spine: 1. No evidence of an acute cervical spine fracture. 2. Nonspecific reversal of the expected cervical lordosis. 3.  Grade 1 spondylolisthesis at C2-C3, C3-C4, C4-C5 and C5-C6. 4. Cervical spondylosis as described. Electronically Signed   By: Rockey Childs D.O.   On: 01/10/2024 11:35   CT Cervical Spine Wo Contrast Result Date: 01/10/2024 CLINICAL DATA:  Provided history: Head trauma, minor. Neck trauma. Fall. Laceration at back of head. EXAM: CT HEAD WITHOUT CONTRAST CT CERVICAL SPINE WITHOUT CONTRAST TECHNIQUE: Multidetector CT imaging of the head and cervical spine was performed following the standard  protocol without intravenous contrast. Multiplanar CT image reconstructions of the cervical spine were also generated. RADIATION DOSE REDUCTION: This exam was performed according to the departmental dose-optimization program which includes automated exposure control, adjustment of the mA and/or kV according to patient size and/or use of iterative reconstruction technique. COMPARISON:  Head CT 03/20/2019.  Cervical spine CT 03/20/2019. FINDINGS: CT HEAD FINDINGS Brain: Mild generalized cerebral atrophy. Patchy and ill-defined hypoattenuation within the cerebral white matter, nonspecific but compatible with mild chronic small vessel ischemic disease. There is no acute intracranial hemorrhage. No demarcated cortical infarct. No extra-axial fluid collection. No evidence of an intracranial mass. No midline shift. Vascular: No hyperdense vessel.  Atherosclerotic calcifications. Skull: No calvarial fracture or aggressive osseous lesion. Sinuses/Orbits: No mass or acute finding within the imaged orbits. No significant paranasal sinus disease at the imaged levels. Other: Small left mastoid effusion.  Posterior scalp hematoma. CT CERVICAL SPINE FINDINGS Alignment: Nonspecific reversal of the expected cervical lordosis. 2 mm grade 1 anterolisthesis at C2-C3 and C3-C4. 3 mm C4-C5 grade 1 anterolisthesis. Slight C5-C6 grade 1 retrolisthesis. Skull base and vertebrae: The basion-dental and atlanto-dental intervals are maintained.No evidence  of acute fracture to the cervical spine. Soft tissues and spinal canal: No prevertebral fluid or swelling. No visible canal hematoma. Disc levels: Cervical spondylosis with multilevel disc space narrowing, disc bulges/central disc protrusions, uncovertebral hypertrophy and facet arthropathy. No appreciable high-grade spinal canal stenosis. Multilevel bony neural foraminal narrowing. Upper chest: No consolidation within the imaged lung apices. Biapical pleuroparenchymal scarring. Other: Calcified atherosclerotic plaque within the proximal major branch vessels of the neck and carotid arteries. IMPRESSION: CT head: 1.  No evidence of an acute intracranial abnormality. 2. Parenchymal atrophy and chronic small vessel ischemic disease. 3. Posterior scalp hematoma. 4. Small left mastoid effusion. CT cervical spine: 1. No evidence of an acute cervical spine fracture. 2. Nonspecific reversal of the expected cervical lordosis. 3. Grade 1 spondylolisthesis at C2-C3, C3-C4, C4-C5 and C5-C6. 4. Cervical spondylosis as described. Electronically Signed   By: Rockey Childs D.O.   On: 01/10/2024 11:35     .Laceration Repair  Date/Time: 01/10/2024 12:45 PM  Performed by: Freddi Hamilton, MD Authorized by: Freddi Hamilton, MD   Consent:    Consent obtained:  Verbal   Consent given by:  Patient Universal protocol:    Patient identity confirmed:  Verbally with patient Anesthesia:    Anesthesia method:  Local infiltration   Local anesthetic:  Lidocaine  2% WITH epi Laceration details:    Location:  Scalp   Length (cm):  3 Pre-procedure details:    Preparation:  Patient was prepped and draped in usual sterile fashion and imaging obtained to evaluate for foreign bodies Treatment:    Amount of cleaning:  Standard   Irrigation solution:  Sterile saline   Irrigation method:  Syringe Skin repair:    Repair method:  Sutures   Suture size:  3-0   Suture material:  Prolene   Suture technique:  Simple interrupted    Number of sutures:  2 Approximation:    Approximation:  Close Repair type:    Repair type:  Simple Post-procedure details:    Procedure completion:  Tolerated well, no immediate complications    Medications Ordered in the ED  lidocaine -EPINEPHrine  (XYLOCAINE  W/EPI) 2 %-1:200000 (PF) injection 10 mL (10 mLs Infiltration Given 01/10/24 1243)  amLODipine  (NORVASC ) tablet 10 mg (10 mg Oral Given 01/10/24 1109)  acetaminophen  (TYLENOL ) tablet 650 mg (650 mg Oral Given 01/10/24 1109)  sodium chloride   0.9 % bolus 500 mL (0 mLs Intravenous Stopped 01/10/24 1413)                                    Medical Decision Making Amount and/or Complexity of Data Reviewed Labs: ordered.    Details: Chronic but stable anemia Radiology: ordered and independent interpretation performed.    Details: No head bleed ECG/medicine tests: ordered and independent interpretation performed.    Details: No acute ischemia  Risk OTC drugs. Prescription drug management.   Patient presents with a fall from generalized weakness.  Seems to be a recurring problem for her.  Scalp laceration is hemostatic and was repaired.  She was able to ambulate and bear weight but states that now her hip is sore.  She has been having some chronic back/left-sided leg pain anyway.  Has some tenderness to left buttock and left lateral hip. X-rays have been ordered and urine is still pending.  Troponins are minimally elevated but flat and not consistent with ACS.  On arrival she was quite hypertensive and had not taken her blood pressure meds.  She was given amlodipine  for her med list though after this dose, she has had some soft blood pressures in the 90s.  Was given a small bolus of fluids.  Blood pressure now seems more stabilized.  She otherwise is well-appearing.  X-rays and urine pending. Care transferred to Dr. Dean.     Final diagnoses:  None    ED Discharge Orders     None          Freddi Hamilton, MD 01/10/24  1534

## 2024-01-10 NOTE — ED Notes (Signed)
 Put fall bracelet on patient

## 2024-01-10 NOTE — ED Notes (Signed)
Pt ambulated with walker independently.

## 2024-01-10 NOTE — ED Notes (Signed)
 EDP at bedside during triage

## 2024-01-10 NOTE — ED Notes (Signed)
 Patient transported to X-ray

## 2024-01-10 NOTE — ED Notes (Signed)
 Patient transported to CT

## 2024-01-10 NOTE — Discharge Instructions (Addendum)
 Decrease your amlodipine  dose to 5 mg daily.

## 2024-01-10 NOTE — ED Triage Notes (Signed)
 Pt was at home attempting to go to the bedside commode and fell . No LOC or blood thinners reported. 3lpm at baseline nasal cannula. Back of head has a laceration and bleeding under control. Pt reported her legs just became weak.

## 2024-01-13 ENCOUNTER — Ambulatory Visit (HOSPITAL_COMMUNITY)
Admission: RE | Admit: 2024-01-13 | Discharge: 2024-01-13 | Disposition: A | Discharge status: Home / Self Care | Source: Ambulatory Visit | Attending: Internal Medicine | Admitting: Internal Medicine

## 2024-01-13 ENCOUNTER — Other Ambulatory Visit (HOSPITAL_COMMUNITY): Payer: Self-pay

## 2024-01-13 DIAGNOSIS — R0989 Other specified symptoms and signs involving the circulatory and respiratory systems: Secondary | ICD-10-CM | POA: Diagnosis not present

## 2024-01-13 DIAGNOSIS — I771 Stricture of artery: Secondary | ICD-10-CM | POA: Insufficient documentation

## 2024-01-14 ENCOUNTER — Ambulatory Visit: Payer: Self-pay | Admitting: Internal Medicine

## 2024-01-15 ENCOUNTER — Other Ambulatory Visit (HOSPITAL_COMMUNITY): Payer: Self-pay

## 2024-01-16 ENCOUNTER — Ambulatory Visit
Admission: EM | Admit: 2024-01-16 | Discharge: 2024-01-16 | Disposition: A | Discharge status: Home / Self Care | Attending: Nurse Practitioner | Admitting: Nurse Practitioner

## 2024-01-16 DIAGNOSIS — Z4802 Encounter for removal of sutures: Secondary | ICD-10-CM

## 2024-01-16 NOTE — Discharge Instructions (Signed)
 2 sutures were removed from the laceration in your scalp. You may take over-the-counter Tylenol  as needed for pain or discomfort. Apply ice to the affected area as needed for pain, swelling, or discomfort. Seek care if you develop foul-smelling drainage from the site, fever, chills, or other concerns. Follow-up as needed.

## 2024-01-16 NOTE — ED Triage Notes (Signed)
 Pt reports to UC for suture removal.  Reports soreness to back of head at suture site.

## 2024-01-16 NOTE — ED Provider Notes (Signed)
 [facility]    CSN: [id number] Arrival date & time: 01/16/24  1815      History   Chief Complaint No chief complaint on file.   HPI Sandra Cuevas is a 89 y.o. female.   The history is provided by the patient.   Patient presents for suture removal.  Patient had 2 sutures placed to the back of her head on 10//25 in the emergency department.  Patient denies fever, chills, or foul-smelling drainage from the site.  She states that she does have some tenderness to the area where the sutures were placed.  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    COPD (chronic obstructive pulmonary disease) (HCC)    Diabetes mellitus    GERD (gastroesophageal reflux disease)    History of cardiovascular stress test 06/01/2010   EF 71% - no evidence of ischemia, normal left ventricular systolic function   History of hysterectomy 1965   Hyperlipidemia    Hypertension    Hypothyroidism    Shortness of breath    on exertion   Tubular adenoma of colon     Patient Active Problem List   Diagnosis Date Noted   Osteoporosis 08/13/2023   S/P mastectomy, right 07/23/2023   Breast cancer of lower-inner quadrant of right female breast (HCC) 07/15/2023   Chronic respiratory failure with hypoxia (HCC) 04/14/2023   Belching 03/27/2022   Upper airway cough syndrome 10/03/2021   Carotid artery disease 07/03/2021   Aortic stenosis 07/03/2021   Mixed stress and urge urinary incontinence 07/13/2020   Rectocele 07/13/2020   Constipation 07/13/2020   S/P hysterectomy with oophorectomy 07/13/2020   Pelvic pressure in female 07/13/2020   Bilateral sciatica 07/13/2020   Encounter for screening fecal occult blood testing 07/13/2020   Dyspnea 08/10/2019   CKD (chronic kidney disease) stage 3, GFR 30-59 ml/min (HCC) 08/10/2019   Right rib fracture 03/21/2019   Acute respiratory failure (HCC)  03/20/2019   Fever 10/08/2018   Chronic rhinitis 11/17/2017   Pulmonary nodules 03/10/2017   GERD (gastroesophageal reflux disease) 10/14/2016   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   Chest pain 07/14/2016   Diabetes mellitus, type II (HCC) 07/14/2016   CKD (chronic kidney disease), stage II 07/14/2016   Cellulitis 07/12/2016   Cellulitis of right leg 07/12/2016   (HFpEF) heart failure with preserved ejection fraction (HCC) 04/30/2016   Hoarse voice quality 09/05/2015   Chronic obstructive airway disease with asthma (HCC) 04/20/2013   Snoring 04/20/2013   Premature ventricular contractions 03/23/2013   Change in stool 01/06/2012   Shortness of breath 09/26/2011   Carpal tunnel syndrome 11/06/2010   Hyperlipidemia LDL goal <70    Atypical chest pain    Hypothyroidism    Diabetes mellitus (HCC)    EXTERNAL HEMORRHOIDS WITHOUT MENTION COMP 02/12/2010   RECTAL BLEEDING 02/12/2010   CERVICAL CANCER 10/18/2009   Dyslipidemia 10/18/2009   Essential hypertension 10/18/2009   Cough 10/18/2009    Past Surgical History:  Procedure Laterality Date   ABDOMINAL HYSTERECTOMY     BREAST BIOPSY Right 07/08/2023   path pending   BREAST BIOPSY Right 07/08/2023   US  RT BREAST BX W LOC DEV 1ST LESION IMG BX SPEC US  GUIDE 07/08/2023 Lennon Nest, MD [facility]   CARDIAC SURGERY     catherization   HEMORRHOID SURGERY     TOTAL  MASTECTOMY Right 07/23/2023   Procedure: MASTECTOMY, SIMPLE;  Surgeon: Mavis Anes, MD;  Location: [facility];  Service: General;  Laterality: Right;   TOTAL SHOULDER REPLACEMENT Right     OB History     Gravida  2   Para  2   Term  2   Preterm      AB      Living  2      SAB      IAB      Ectopic      Multiple      Live Births               Home Medications    Prior to Admission medications   Medication Sig Start Date End Date Taking? Authorizing Provider  acetaminophen  (TYLENOL ) 500 MG tablet Take 2 tablets (1,000 mg total) by mouth  every 6 (six) hours as needed. 07/05/16   Armenta Canning, MD  albuterol  (PROVENTIL ) (2.5 MG/3ML) 0.083% nebulizer solution Take 3 mLs (2.5 mg total) by nebulization every 6 (six) hours as needed for wheezing or shortness of breath. 05/16/21   Rolinda Rogue, MD  amLODipine  (NORVASC ) 5 MG tablet Take 1 tablet (5 mg total) by mouth daily. 01/10/24   Dean Clarity, MD  aspirin  EC 81 MG tablet Take 1 tablet (81 mg total) by mouth daily. Swallow whole. 12/30/23   Segal, Jared E, MD  atorvastatin  (LIPITOR) 40 MG tablet TAKE 1 TABLET(40 MG) BY MOUTH DAILY AT 6 PM 12/24/23   Lelon Hamilton T, PA-C  azithromycin  (ZITHROMAX ) 250 MG tablet Take 2 on the first day, then take 1 daily until completely gone. 12/18/23   Shelah Lamar RAMAN, MD  calcium  carbonate (OSCAL) 1500 (600 Ca) MG TABS tablet Take 600 mg by mouth at bedtime.    [provider]  Cholecalciferol (VITAMIN D3 PO) Take 2,000 Units by mouth at bedtime.    [provider]  docusate sodium  (COLACE) 100 MG capsule Take 100 mg by mouth at bedtime. Patient not taking: Reported on 12/30/2023    [provider]  famotidine  (PEPCID ) 20 MG tablet Take 1 tablet (20 mg total) by mouth 2 (two) times daily. Patient not taking: Reported on 12/30/2023 03/27/22   Zehr, Jessica D, PA-C  ferrous sulfate  325 (65 FE) MG EC tablet Take 1 tablet (325 mg total) by mouth 2 (two) times daily. 07/25/23 07/24/24  Mavis Anes, MD  fluticasone  (FLONASE ) 50 MCG/ACT nasal spray SHAKE LIQUID AND USE 2 SPRAYS IN EACH NOSTRIL DAILY Patient taking differently: Place 2 sprays into both nostrils daily as needed for allergies. 12/07/18   Shelah Lamar RAMAN, MD  furosemide  (LASIX ) 20 MG tablet Take 20 mg by mouth daily as needed for fluid. 05/15/23   [provider]  gabapentin  (NEURONTIN ) 600 MG tablet Take 600 mg by mouth 3 (three) times daily. 08/05/19   [provider]  ibuprofen (ADVIL) 200 MG tablet Take 200 mg by mouth at bedtime. Patient not taking:  Reported on 12/30/2023    [provider]  levothyroxine  (SYNTHROID , LEVOTHROID) 50 MCG tablet Take 50 mcg by mouth daily before breakfast.    [provider]  montelukast  (SINGULAIR ) 10 MG tablet Take 10 mg by mouth in the morning. 05/16/23   [provider]  Multiple Vitamin (MULTIVITAMIN WITH MINERALS) TABS tablet Take 1 tablet by mouth at bedtime.    [provider]  Multiple Vitamins-Minerals (PRESERVISION AREDS 2+MULTI VIT PO) Take 1 tablet by mouth in the morning.  [provider]  olopatadine (HM EYE ALLERGY ITCH/RED RELIEF) 0.1 % ophthalmic solution Place 1-2 drops into both eyes 2 (two) times daily as needed for allergies.    [provider]  omeprazole  (PRILOSEC) 20 MG capsule TAKE 1 CAPSULE(20 MG) BY MOUTH DAILY 06/23/23   Zehr, Jessica D, PA-C  polyethylene glycol (MIRALAX  / GLYCOLAX ) 17 g packet Take 17 g by mouth daily as needed (constipation).    [provider]  potassium chloride  (KLOR-CON ) 10 MEQ tablet Take 10 mEq by mouth in the morning. 07/03/23   [provider]  tamoxifen  (NOLVADEX ) 20 MG tablet Take 1 tablet (20 mg total) by mouth daily. 08/13/23   Rogers Hai, MD  zolpidem  (AMBIEN ) 5 MG tablet Take 5 mg by mouth at bedtime as needed for sleep.    [provider]    Family History Family History  Problem Relation Age of Onset   Heart disease Father        heart problems   Kidney failure Mother    Diabetes Mother    Leukemia Brother    Diabetes Brother    Colon cancer Sister    Leukemia Brother    Diabetes Brother    Pancreatic cancer Brother    Bone cancer Sister     Social History Social History   Tobacco Use   Smoking status: Never   Smokeless tobacco: Never  Vaping Use   Vaping status: Never Used  Substance Use Topics   Alcohol  use: No   Drug use: Never     Allergies   Patient has no known allergies.   Review of Systems Review of Systems Per HPI  Physical  Exam Triage Vital Signs ED Triage Vitals  Encounter Vitals Group     BP 01/16/24 1856 (!) 161/70     Girls Systolic BP Percentile --      Girls Diastolic BP Percentile --      Boys Systolic BP Percentile --      Boys Diastolic BP Percentile --      Pulse Rate 01/16/24 1856 100     Resp 01/16/24 1856 (!) 24     Temp 01/16/24 1856 98.8 F (37.1 C)     Temp Source 01/16/24 1856 Oral     SpO2 01/16/24 1856 (!) 86 %     Weight --      Height --      Head Circumference --      Peak Flow --      Pain Score 01/16/24 1858 0     Pain Loc --      Pain Education --      Exclude from Growth Chart --    No data found.  Updated Vital Signs BP (!) 161/70 (BP Location: Right Arm)   Pulse 100   Temp 98.8 F (37.1 C) (Oral)   Resp (!) 24   SpO2 (!) 86%   Visual Acuity Right Eye Distance:   Left Eye Distance:   Bilateral Distance:    Right Eye Near:   Left Eye Near:    Bilateral Near:     Physical Exam Vitals and nursing note reviewed.  Constitutional:      General: She is not in acute distress.    Appearance: Normal appearance.  HENT:     Head: Normocephalic.  Eyes:     Extraocular Movements: Extraocular movements intact.     Pupils: Pupils are equal, round, and reactive to light.  Cardiovascular:  Rate and Rhythm: Normal rate and regular rhythm.     Pulses: Normal pulses.     Heart sounds: Normal heart sounds.  Pulmonary:     Effort: Pulmonary effort is normal.     Breath sounds: Normal breath sounds.  Musculoskeletal:     Cervical back: Normal range of motion.  Skin:    General: Skin is warm and dry.     Findings: Laceration present.      Neurological:     General: No focal deficit present.     Mental Status: She is alert and oriented to person, place, and time.  Psychiatric:        Mood and Affect: Mood normal.        Behavior: Behavior normal.      UC Treatments / Results  Labs (all labs ordered are listed, but only abnormal results are  displayed) Labs Reviewed - No data to display  EKG   Radiology No results found.  Procedures Procedures (including critical care time)  Medications Ordered in UC Medications - No data to display  Initial Impression / Assessment and Plan / UC Course  I have reviewed the triage vital signs and the nursing notes.  Pertinent labs & imaging results that were available during my care of the patient were reviewed by me and considered in my medical decision making (see chart for details).  2 sutures were removed from the occipital region of the scalp.  Suture line is intact.  No signs of infection present.  O2 sat was repeated after oxygen  was increased to 3 L via nasal cannula which is what the patient wears at home.  O2 sats improved to 98 to 99%.  Supportive care recommendations were provided and discussed with the patient and her family to include over-the-counter Tylenol  for pain or discomfort, and applying ice to help with pain or discomfort.  Discussed indications with patient and family regarding follow-up.  Patient and family were in agreement with this plan of care and verbalized understanding.  All questions were answered.  Patient stable for discharge.   Final Clinical Impressions(s) / UC Diagnoses   Final diagnoses:  Encounter for removal of sutures     Discharge Instructions      2 sutures were removed from the laceration in your scalp. You may take over-the-counter Tylenol  as needed for pain or discomfort. Apply ice to the affected area as needed for pain, swelling, or discomfort. Seek care if you develop foul-smelling drainage from the site, fever, chills, or other concerns. Follow-up as needed.     ED Prescriptions   None    PDMP not reviewed this encounter.   Gilmer Etta PARAS, NP 01/16/24 1913

## 2024-01-22 ENCOUNTER — Ambulatory Visit: Admitting: Emergency Medicine

## 2024-01-22 ENCOUNTER — Other Ambulatory Visit: Payer: Self-pay | Admitting: Emergency Medicine

## 2024-01-22 ENCOUNTER — Encounter: Payer: Self-pay | Admitting: Emergency Medicine

## 2024-01-22 VITALS — BP 140/80 | HR 89 | Temp 98.6°F | Ht 65.0 in | Wt 205.0 lb

## 2024-01-22 DIAGNOSIS — R918 Other nonspecific abnormal finding of lung field: Secondary | ICD-10-CM | POA: Diagnosis not present

## 2024-01-22 DIAGNOSIS — J9611 Chronic respiratory failure with hypoxia: Secondary | ICD-10-CM

## 2024-01-22 DIAGNOSIS — J4489 Other specified chronic obstructive pulmonary disease: Secondary | ICD-10-CM

## 2024-01-22 NOTE — Assessment & Plan Note (Signed)
 Multifactorial with significant and progressive dyspnea, desaturation with exertion on presentation today even on 3 L/min.  She does recover quickly when at rest.  Suspect at least in part due to sequela from her pneumonia at the beginning of the year, superimposed COPD although she does not use her dilator frequently or perceive much benefit from them.  I do believe she needs to have her repeat CT scan of the chest in the setting of her breast cancer.  She is at risk for progressive nodular disease, pleural effusion, etc. that would all contribute to possible dyspnea and could be palliated.

## 2024-01-22 NOTE — Progress Notes (Signed)
 Subjective:    Patient ID: Sandra Cuevas, female    DOB: 1928-12-07, 89 y.o.   MRN: [id number]  COPD Her past medical history is significant for COPD.    ROV 01/22/2024 --Ms. Rozak is 68 with history of fixed asthma/COPD, chronic cough with upper airway irritation syndrome (GERD/hiatal hernia, allergic rhinitis), newly diagnosed breast cancer from earlier this year.  She had pneumonia in early 2025 and was discharged on oxygen  at 2-3 L/min.  I have followed her for her cough and obstructive lung disease as well as pulmonary nodules noted on chest imaging, most recently in June 2025.  That recent scan showed a new irregular opacity in the right upper lobe 1.1 x 0.6 cm.  We had planned for a repeat CT chest in approximately 3 months, does not look like this has been done yet. Unfortunately she experienced a fall at the beginning of this month, head CT and cervical spine CT were reassuring.  CT scan of the hip did not show a fracture.  She did have a posterior scalp hematoma that was sutured.  She reports significant shortness of breath with exertion.  She reports thar legs gave out on her when she fell. She is much less active, has had desaturations with activity including just simple walking on 3 L/min. She does not feel SOB at rest, she is SOB and shaky when she walks even w O2 on.  She is on Singulair , fluticasone  nasal spray, omeprazole  and famotidine , Robitussin as needed.  She is not on scheduled BD therapy but has albuterol  nebs which she uses approximately once a month. Remains on tamoxifen .    Review of Systems As per HPI      Objective:   Physical Exam Vitals:   01/22/24 0924  BP: (!) 140/80  Pulse: 89  Temp: 98.6 F (37 C)  TempSrc: Oral  SpO2: 90%  Weight: 205 lb (93 kg)  Height: 5' 5 (1.651 m)     Gen: Pleasant, well-nourished, in no distress,  normal affect  ENT: No lesions,  mouth clear,  oropharynx clear, very hoarse gravelly voice  Neck: No JVD, no stridor but  she is hoarse.   Lungs: No use of accessory muscles, clear without rales or rhonchi, no wheeze.   Cardiovascular: RRR, heart sounds normal, no murmur or gallops, no peripheral edema  Musculoskeletal: No deformities  Neuro: alert, non focal  Skin: Warm, no lesions or rashes      Assessment & Plan:  Chronic respiratory failure with hypoxia (HCC) Multifactorial with significant and progressive dyspnea, desaturation with exertion on presentation today even on 3 L/min.  She does recover quickly when at rest.  Suspect at least in part due to sequela from her pneumonia at the beginning of the year, superimposed COPD although she does not use her dilator frequently or perceive much benefit from them.  I do believe she needs to have her repeat CT scan of the chest in the setting of her breast cancer.  She is at risk for progressive nodular disease, pleural effusion, etc. that would all contribute to possible dyspnea and could be palliated.  Pulmonary nodules She has not had a repeat CT scan of her chest yet.  We will perform now and follow-up for interval stability.  There was a focus in the right upper lobe that was new on her scan from June.  Chronic obstructive airway disease with asthma (HCC) She does not use albuterol  with any frequency, does sometimes use it when  she wheezes.  I talked her today about trying to use the albuterol  more to see if it helps with her dyspnea on exertion.  We talked in particular about pretreating before she gets up to walk or goes to appointments, etc.  Time spent 33 minutes discussing symptoms, history, recent testing including CT scan chest, plans for diagnostics and treatment.  Lamar Chris, MD, PhD 01/22/2024, 9:45 AM [facility] [phone] or if no answer [phone]

## 2024-01-22 NOTE — Assessment & Plan Note (Signed)
 She has not had a repeat CT scan of her chest yet.  We will perform now and follow-up for interval stability.  There was a focus in the right upper lobe that was new on her scan from June.

## 2024-01-22 NOTE — Telephone Encounter (Signed)
 Copied from CRM [id number]. Topic: Clinical - Medication Refill >> Jan 22, 2024  1:20 PM Russell J wrote: Medication:   albuterol  (PROVENTIL ) (2.5 MG/3ML) 0.083% nebulizer solution   Has the patient contacted their pharmacy? Yes (Agent: If no, request that the patient contact the pharmacy for the refill. If patient does not wish to contact the pharmacy document the reason why and proceed with request.) (Agent: If yes, when and what did the pharmacy advise?)  This is the patient's preferred pharmacy:   [facility] [address], [place] - [address] [place] [address] Phone: [phone] Fax: [phone]  Is this the correct pharmacy for this prescription? Yes If no, delete pharmacy and type the correct one.   Has the prescription been filled recently? No  Is the patient out of the medication? Yes  Has the patient been seen for an appointment in the last year OR does the patient have an upcoming appointment? Yes, last seen today by [place]  Can we respond through MyChart? Yes  Agent: Please be advised that Rx refills may take up to 3 business days. We ask that you follow-up with your pharmacy.

## 2024-01-22 NOTE — Patient Instructions (Addendum)
 We will arrange to repeat your CT scan of the chest at [facility] to compare with your prior from June 2025 Please continue to use your oxygen  at 3 L/min at all times, especially with exertion. You can use your albuterol  nebulizer if you need it for shortness of breath, chest tightness, wheezing.  You might want to try using this more frequently to see if it helps your overall breathing. Continue your other medications as you have been taking them Continue to follow-up with oncology as planned Follow Dr. Shelah in 2 months, sooner if you have any problems.

## 2024-01-22 NOTE — Assessment & Plan Note (Signed)
 She does not use albuterol  with any frequency, does sometimes use it when she wheezes.  I talked her today about trying to use the albuterol  more to see if it helps with her dyspnea on exertion.  We talked in particular about pretreating before she gets up to walk or goes to appointments, etc.

## 2024-01-27 ENCOUNTER — Other Ambulatory Visit (HOSPITAL_COMMUNITY): Payer: Self-pay | Admitting: Physician Assistant

## 2024-01-27 DIAGNOSIS — M5416 Radiculopathy, lumbar region: Secondary | ICD-10-CM | POA: Diagnosis not present

## 2024-01-27 DIAGNOSIS — M5459 Other low back pain: Secondary | ICD-10-CM | POA: Diagnosis not present

## 2024-01-27 DIAGNOSIS — M7989 Other specified soft tissue disorders: Secondary | ICD-10-CM | POA: Diagnosis not present

## 2024-01-27 DIAGNOSIS — M79605 Pain in left leg: Secondary | ICD-10-CM | POA: Diagnosis not present

## 2024-01-27 DIAGNOSIS — M79662 Pain in left lower leg: Secondary | ICD-10-CM | POA: Diagnosis not present

## 2024-01-28 ENCOUNTER — Ambulatory Visit (HOSPITAL_COMMUNITY)
Admission: RE | Admit: 2024-01-28 | Discharge: 2024-01-28 | Disposition: A | Discharge status: Home / Self Care | Source: Ambulatory Visit | Attending: Vascular Surgery | Admitting: Vascular Surgery

## 2024-01-28 DIAGNOSIS — M7989 Other specified soft tissue disorders: Secondary | ICD-10-CM | POA: Insufficient documentation

## 2024-02-04 ENCOUNTER — Encounter: Payer: Self-pay | Admitting: Vascular Surgery

## 2024-02-04 ENCOUNTER — Ambulatory Visit (HOSPITAL_COMMUNITY)
Admission: RE | Admit: 2024-02-04 | Discharge: 2024-02-04 | Disposition: A | Discharge status: Home / Self Care | Source: Ambulatory Visit | Attending: Emergency Medicine | Admitting: Emergency Medicine

## 2024-02-04 ENCOUNTER — Ambulatory Visit: Attending: Vascular Surgery | Admitting: Vascular Surgery

## 2024-02-04 VITALS — BP 159/72 | HR 73 | Temp 98.1°F

## 2024-02-04 DIAGNOSIS — J90 Pleural effusion, not elsewhere classified: Secondary | ICD-10-CM | POA: Diagnosis not present

## 2024-02-04 DIAGNOSIS — I6523 Occlusion and stenosis of bilateral carotid arteries: Secondary | ICD-10-CM

## 2024-02-04 DIAGNOSIS — I700 Atherosclerosis of aorta: Secondary | ICD-10-CM | POA: Diagnosis not present

## 2024-02-04 DIAGNOSIS — R918 Other nonspecific abnormal finding of lung field: Secondary | ICD-10-CM | POA: Diagnosis not present

## 2024-02-04 NOTE — Progress Notes (Signed)
 Patient ID: Sandra Cuevas, female   DOB: [birth year], 89 y.o.   MRN: [id number]  Reason for Consult: New Patient (Initial Visit)   Referred by Marvine Rush, MD  Subjective:     HPI:  Sandra Cuevas is a 89 y.o. female with known rotted artery stenosis.  She is here to establish follow-up.  She denies any history of stroke, TIA or amaurosis.  She does have a history of right shoulder replacement with weakness in the right upper extremity otherwise no upper extremity or hand issues.  She is in a wheelchair today currently uses 3 L of nasal cannula.  She is 1 of 24 children.  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    COPD (chronic obstructive pulmonary disease) (HCC)    Diabetes mellitus    GERD (gastroesophageal reflux disease)    History of cardiovascular stress test 06/01/2010   EF 71% - no evidence of ischemia, normal left ventricular systolic function   History of hysterectomy 1965   Hyperlipidemia    Hypertension    Hypothyroidism    Shortness of breath    on exertion   Tubular adenoma of colon    Family History  Problem Relation Age of Onset   Heart disease Father        heart problems   Kidney failure Mother    Diabetes Mother    Leukemia Brother    Diabetes Brother    Colon cancer Sister    Leukemia Brother    Diabetes Brother    Pancreatic cancer Brother    Bone cancer Sister    Past Surgical History:  Procedure Laterality Date   ABDOMINAL HYSTERECTOMY     BREAST BIOPSY Right 07/08/2023   path pending   BREAST BIOPSY Right 07/08/2023   US  RT BREAST BX W LOC DEV 1ST LESION IMG BX SPEC US  GUIDE 07/08/2023 Lennon Nest, MD [facility]   CARDIAC SURGERY     catherization   HEMORRHOID SURGERY     TOTAL MASTECTOMY Right 07/23/2023   Procedure: MASTECTOMY, SIMPLE;  Surgeon: Mavis Anes, MD;  Location: [facility];  Service: General;  Laterality: Right;   TOTAL  SHOULDER REPLACEMENT Right     Short Social History:  Social History   Tobacco Use   Smoking status: Never   Smokeless tobacco: Never  Substance Use Topics   Alcohol  use: No    No Known Allergies  Current Outpatient Medications  Medication Sig Dispense Refill   acetaminophen  (TYLENOL ) 500 MG tablet Take 2 tablets (1,000 mg total) by mouth every 6 (six) hours as needed. 30 tablet 0   albuterol  (PROVENTIL ) (2.5 MG/3ML) 0.083% nebulizer solution Take 3 mLs (2.5 mg total) by nebulization every 6 (six) hours as needed for wheezing or shortness of breath. 75 mL 0   amLODipine  (NORVASC ) 5 MG tablet Take 1 tablet (5 mg total) by mouth daily. 30 tablet 0   aspirin  EC 81 MG tablet Take 1 tablet (81 mg total) by mouth daily. Swallow whole.     atorvastatin  (LIPITOR) 40 MG tablet TAKE 1 TABLET(40 MG) BY MOUTH DAILY AT 6 PM 90 tablet 0   azithromycin  (ZITHROMAX ) 250 MG tablet Take 2 on the first day, then take 1 daily until completely gone. 6 tablet 0   calcium  carbonate (OSCAL) 1500 (600 Ca) MG TABS tablet Take  600 mg by mouth at bedtime.     Cholecalciferol (VITAMIN D3 PO) Take 2,000 Units by mouth at bedtime.     docusate sodium  (COLACE) 100 MG capsule Take 100 mg by mouth at bedtime.     famotidine  (PEPCID ) 20 MG tablet Take 1 tablet (20 mg total) by mouth 2 (two) times daily. 180 tablet 1   ferrous sulfate  325 (65 FE) MG EC tablet Take 1 tablet (325 mg total) by mouth 2 (two) times daily. 60 tablet 11   fluticasone  (FLONASE ) 50 MCG/ACT nasal spray SHAKE LIQUID AND USE 2 SPRAYS IN EACH NOSTRIL DAILY 16 g 5   furosemide  (LASIX ) 20 MG tablet Take 20 mg by mouth daily as needed for fluid.     gabapentin  (NEURONTIN ) 600 MG tablet Take 600 mg by mouth 3 (three) times daily.     ibuprofen (ADVIL) 200 MG tablet Take 200 mg by mouth at bedtime.     levothyroxine  (SYNTHROID , LEVOTHROID) 50 MCG tablet Take 50 mcg by mouth daily before breakfast.     montelukast  (SINGULAIR ) 10 MG tablet Take 10 mg by  mouth in the morning.     Multiple Vitamin (MULTIVITAMIN WITH MINERALS) TABS tablet Take 1 tablet by mouth at bedtime.     Multiple Vitamins-Minerals (PRESERVISION AREDS 2+MULTI VIT PO) Take 1 tablet by mouth in the morning.     olopatadine (HM EYE ALLERGY ITCH/RED RELIEF) 0.1 % ophthalmic solution Place 1-2 drops into both eyes 2 (two) times daily as needed for allergies.     omeprazole  (PRILOSEC) 20 MG capsule TAKE 1 CAPSULE(20 MG) BY MOUTH DAILY 90 capsule 3   polyethylene glycol (MIRALAX  / GLYCOLAX ) 17 g packet Take 17 g by mouth daily as needed (constipation).     potassium chloride  (KLOR-CON ) 10 MEQ tablet Take 10 mEq by mouth in the morning.     tamoxifen  (NOLVADEX ) 20 MG tablet Take 1 tablet (20 mg total) by mouth daily. 90 tablet 2   zolpidem  (AMBIEN ) 5 MG tablet Take 5 mg by mouth at bedtime as needed for sleep.     No current facility-administered medications for this visit.    Review of Systems  Constitutional:  Constitutional negative. HENT: HENT negative.  Eyes: Eyes negative.  Respiratory: Positive for shortness of breath.  Cardiovascular: Cardiovascular negative.  GI: Gastrointestinal negative.  Musculoskeletal: Positive for gait problem.  Hematologic: Hematologic/lymphatic negative.  Psychiatric: Psychiatric negative.        Objective:  Objective   Vitals:   02/04/24 1046  BP: (!) 159/72  Pulse: 73  Temp: 98.1 F (36.7 C)  SpO2: 92%   There is no height or weight on file to calculate BMI.  Physical Exam HENT:     Head: Normocephalic.     Nose: Nose normal.  Neck:     Vascular: Carotid bruit present.  Cardiovascular:     Rate and Rhythm: Normal rate.     Pulses:          Radial pulses are 2+ on the right side and 1+ on the left side.     Heart sounds: Murmur heard.  Musculoskeletal:        General: Normal range of motion.     Right lower leg: No edema.     Left lower leg: No edema.  Skin:    General: Skin is warm.     Capillary Refill: Capillary  refill takes less than 2 seconds.  Neurological:     General: No focal deficit present.  Mental Status: She is alert.     Data: Right Carotid Findings:  +----------+--------+--------+--------+--------------------------+--------+            PSV cm/sEDV cm/sStenosisPlaque Description         Comments  +----------+--------+--------+--------+--------------------------+--------+   CCA Prox  91      18                                                   +----------+--------+--------+--------+--------------------------+--------+   CCA Distal138     23                                                   +----------+--------+--------+--------+--------------------------+--------+   ICA Prox  221     50      40-59%  heterogenous and irregular           +----------+--------+--------+--------+--------------------------+--------+   ICA Mid   235     51                                                   +----------+--------+--------+--------+--------------------------+--------+   ICA Distal101     26                                                   +----------+--------+--------+--------+--------------------------+--------+   ECA      201     22                                                   +----------+--------+--------+--------+--------------------------+--------+    +----------+--------+-------+----------------+-------------------+           PSV cm/sEDV cmsDescribe        Arm Pressure (mmHG)  +----------+--------+-------+----------------+-------------------+  [id number]           Multiphasic, [id number]                  +----------+--------+-------+----------------+-------------------+   +---------+--------+---+--------+--+---------+  VertebralPSV cm/s102EDV cm/s23Antegrade  +---------+--------+---+--------+--+---------+   Right brachial artery demonstrates brisk, biphasic flow   Left Carotid Findings:   +----------+--------+--------+--------+------------------+--------+           PSV cm/sEDV cm/sStenosisPlaque DescriptionComments  +----------+--------+--------+--------+------------------+--------+  CCA Prox  105     24                                          +----------+--------+--------+--------+------------------+--------+  CCA Distal97      24                                          +----------+--------+--------+--------+------------------+--------+  ICA Prox  348     95  60-79%  heterogenous                +----------+--------+--------+--------+------------------+--------+  ICA Mid   276     58                                tortuous  +----------+--------+--------+--------+------------------+--------+  ICA Distal156     31                                          +----------+--------+--------+--------+------------------+--------+  ECA      478     3       >50%    calcific                    +----------+--------+--------+--------+------------------+--------+   +----------+--------+--------+----------+-------------------+           PSV cm/sEDV cm/sDescribe  Arm Pressure (mmHG)  +----------+--------+--------+----------+-------------------+  [id number]            Monophasic116                  +----------+--------+--------+----------+-------------------+   +---------+--------+--+--------+----------+  VertebralPSV cm/s69EDV cm/sRetrograde  +---------+--------+--+--------+----------+   Left brachial artery demonstrates monophasic flow      Summary:  Right Carotid: Velocities in the right ICA are consistent with a 40-59%                 stenosis.   Left Carotid: Velocities in the left ICA are consistent with a 60-79%  stenosis.               The ECA appears >50% stenosed.   Vertebrals:  Right vertebral artery demonstrates antegrade flow. Left  vertebral              artery demonstrates retrograde flow.   Subclavians: Left subclavian artery flow was disturbed and monophasic.  Normal              flow hemodynamics were seen in the right subclavian artery.  There              is a 60 mmHg pressure difference between the arms, with the  left              being the lowest.      Assessment/Plan:     89 year old female with bilateral carotid artery stenosis on the left side of 60 to 79% and remains asymptomatic also has left subclavian artery stenosis by duplex which also is asymptomatic.  Given her high risk nature of surgery we would not treat any of this unless she has symptoms.  As such she can follow-up in 1 year with repeat carotid duplex.  All questions were answered she demonstrates good understanding in the presence of her daughter.     Penne Lonni Colorado MD [facility]

## 2024-02-05 DIAGNOSIS — M545 Low back pain, unspecified: Secondary | ICD-10-CM | POA: Diagnosis not present

## 2024-02-10 ENCOUNTER — Other Ambulatory Visit: Payer: Self-pay

## 2024-02-10 DIAGNOSIS — M810 Age-related osteoporosis without current pathological fracture: Secondary | ICD-10-CM

## 2024-02-10 DIAGNOSIS — C50311 Malignant neoplasm of lower-inner quadrant of right female breast: Secondary | ICD-10-CM

## 2024-02-10 DIAGNOSIS — E538 Deficiency of other specified B group vitamins: Secondary | ICD-10-CM

## 2024-02-11 ENCOUNTER — Inpatient Hospital Stay: Attending: Oncology

## 2024-02-11 DIAGNOSIS — Z800 Family history of malignant neoplasm of digestive organs: Secondary | ICD-10-CM | POA: Diagnosis not present

## 2024-02-11 DIAGNOSIS — M810 Age-related osteoporosis without current pathological fracture: Secondary | ICD-10-CM | POA: Diagnosis not present

## 2024-02-11 DIAGNOSIS — Z1721 Progesterone receptor positive status: Secondary | ICD-10-CM | POA: Diagnosis not present

## 2024-02-11 DIAGNOSIS — D696 Thrombocytopenia, unspecified: Secondary | ICD-10-CM | POA: Diagnosis not present

## 2024-02-11 DIAGNOSIS — Z8541 Personal history of malignant neoplasm of cervix uteri: Secondary | ICD-10-CM | POA: Insufficient documentation

## 2024-02-11 DIAGNOSIS — Z170 Estrogen receptor positive status [ER+]: Secondary | ICD-10-CM | POA: Insufficient documentation

## 2024-02-11 DIAGNOSIS — M5410 Radiculopathy, site unspecified: Secondary | ICD-10-CM | POA: Insufficient documentation

## 2024-02-11 DIAGNOSIS — E538 Deficiency of other specified B group vitamins: Secondary | ICD-10-CM

## 2024-02-11 DIAGNOSIS — Z806 Family history of leukemia: Secondary | ICD-10-CM | POA: Diagnosis not present

## 2024-02-11 DIAGNOSIS — C50311 Malignant neoplasm of lower-inner quadrant of right female breast: Secondary | ICD-10-CM | POA: Diagnosis not present

## 2024-02-11 DIAGNOSIS — R918 Other nonspecific abnormal finding of lung field: Secondary | ICD-10-CM | POA: Insufficient documentation

## 2024-02-11 DIAGNOSIS — D539 Nutritional anemia, unspecified: Secondary | ICD-10-CM | POA: Insufficient documentation

## 2024-02-11 DIAGNOSIS — Z808 Family history of malignant neoplasm of other organs or systems: Secondary | ICD-10-CM | POA: Insufficient documentation

## 2024-02-11 DIAGNOSIS — Z9011 Acquired absence of right breast and nipple: Secondary | ICD-10-CM | POA: Diagnosis not present

## 2024-02-11 DIAGNOSIS — Z1732 Human epidermal growth factor receptor 2 negative status: Secondary | ICD-10-CM | POA: Insufficient documentation

## 2024-02-11 DIAGNOSIS — G8929 Other chronic pain: Secondary | ICD-10-CM | POA: Diagnosis not present

## 2024-02-11 DIAGNOSIS — Z79810 Long term (current) use of selective estrogen receptor modulators (SERMs): Secondary | ICD-10-CM | POA: Diagnosis not present

## 2024-02-11 LAB — CBC WITH DIFFERENTIAL/PLATELET
Abs Immature Granulocytes: 0.02 K/uL (ref 0.00–0.07)
Basophils Absolute: 0 K/uL (ref 0.0–0.1)
Basophils Relative: 0 %
Eosinophils Absolute: 0.1 K/uL (ref 0.0–0.5)
Eosinophils Relative: 2 %
HCT: 31 % — ABNORMAL LOW (ref 36.0–46.0)
Hemoglobin: 9.6 g/dL — ABNORMAL LOW (ref 12.0–15.0)
Immature Granulocytes: 0 %
Lymphocytes Relative: 14 %
Lymphs Abs: 1 K/uL (ref 0.7–4.0)
MCH: 33.1 pg (ref 26.0–34.0)
MCHC: 31 g/dL (ref 30.0–36.0)
MCV: 106.9 fL — ABNORMAL HIGH (ref 80.0–100.0)
Monocytes Absolute: 0.8 K/uL (ref 0.1–1.0)
Monocytes Relative: 12 %
Neutro Abs: 4.8 K/uL (ref 1.7–7.7)
Neutrophils Relative %: 72 %
Platelets: 133 K/uL — ABNORMAL LOW (ref 150–400)
RBC: 2.9 MIL/uL — ABNORMAL LOW (ref 3.87–5.11)
RDW: 14.5 % (ref 11.5–15.5)
WBC: 6.8 K/uL (ref 4.0–10.5)
nRBC: 0 % (ref 0.0–0.2)

## 2024-02-11 LAB — COMPREHENSIVE METABOLIC PANEL WITH GFR
ALT: 18 U/L (ref 0–44)
AST: 25 U/L (ref 15–41)
Albumin: 3.7 g/dL (ref 3.5–5.0)
Alkaline Phosphatase: 98 U/L (ref 38–126)
Anion gap: 6 (ref 5–15)
BUN: 18 mg/dL (ref 8–23)
CO2: 39 mmol/L — ABNORMAL HIGH (ref 22–32)
Calcium: 9 mg/dL (ref 8.9–10.3)
Chloride: 95 mmol/L — ABNORMAL LOW (ref 98–111)
Creatinine, Ser: 0.88 mg/dL (ref 0.44–1.00)
GFR, Estimated: >60 mL/min (ref >60)
Glucose, Bld: 231 mg/dL — ABNORMAL HIGH (ref 70–99)
Potassium: 4.7 mmol/L (ref 3.5–5.1)
Sodium: 140 mmol/L (ref 135–145)
Total Bilirubin: 0.5 mg/dL (ref 0.0–1.2)
Total Protein: 6.3 g/dL — ABNORMAL LOW (ref 6.5–8.1)

## 2024-02-11 LAB — VITAMIN D 25 HYDROXY (VIT D DEFICIENCY, FRACTURES): Vit D, 25-Hydroxy: 39.09 ng/mL (ref 30–100)

## 2024-02-11 LAB — IRON AND TIBC
Iron: 66 ug/dL (ref 28–170)
Saturation Ratios: 25 % (ref 10.4–31.8)
TIBC: 259 ug/dL (ref 250–450)
UIBC: 193 ug/dL

## 2024-02-11 LAB — VITAMIN B12: Vitamin B-12: 1011 pg/mL — ABNORMAL HIGH (ref 180–914)

## 2024-02-11 LAB — FERRITIN: Ferritin: 475 ng/mL — ABNORMAL HIGH (ref 11–307)

## 2024-02-13 NOTE — Telephone Encounter (Signed)
 Yes we should refill this. Thank you

## 2024-02-13 NOTE — Telephone Encounter (Signed)
 Copied from CRM #[id number]. Topic: Clinical - Prescription Issue >> Feb 10, 2024  2:54 PM Rilla B wrote: Reason for CRM: Patient's daughter Angeline calling in regarding Albuterol  for the nebulizer which was order on 01/22/2024. Daughter states patient is using old medication and really needs her refill.  Unsure why medication was ordered on 01/22/2024 and is refused requests.  Please call patient's daughter Angeline @ [phone]   ----------------------------------------------------------------------- From previous Reason for Contact - Other: Reason for CRM:   Pt requesting a refill on her albuterol  for her neb but you did not prescribe it, do you want me to refill under your name?

## 2024-02-16 ENCOUNTER — Other Ambulatory Visit: Payer: Self-pay

## 2024-02-16 MED ORDER — ALBUTEROL SULFATE (2.5 MG/3ML) 0.083% IN NEBU: 2.5000 mg | INHALATION_SOLUTION | Freq: Four times a day (QID) | RESPIRATORY_TRACT | 3 refills | Status: DC | PRN

## 2024-02-17 NOTE — Progress Notes (Addendum)
 [facility] [address], [place] [address]   CLINIC:  Medical Oncology/Hematology  PCP:  Marvine Rush, MD [address] [place] [address] [phone]   REASON FOR VISIT:  Follow-up for stage I right-sided breast cancer + Anemia (possible MDS)  PRIOR THERAPY: - Right mastectomy (07/23/2023) - Anastrozole  (07/15/2023 through 08/13/2023), switched to tamoxifen  due to osteoporosis  CURRENT THERAPY: Tamoxifen  (since 08/13/2023)  BRIEF ONCOLOGIC HISTORY:   Oncology History  Breast cancer of lower-inner quadrant of right female breast (HCC)  07/15/2023 Initial Diagnosis   Breast cancer of lower-inner quadrant of right female breast (HCC)   08/13/2023 Cancer Staging   Staging form: Breast, AJCC 8th Edition - Pathologic stage from 08/13/2023: Stage Unknown (pT2, pNX, cM0, G2, ER+, PR+, HER2-) - Signed by Rogers Hai, MD on 08/13/2023 Histopathologic type: Infiltrating duct carcinoma, NOS Stage prefix: Initial diagnosis Nuclear grade: G2 Multigene prognostic tests performed: None Histologic grading system: 3 grade system Residual tumor (R): R0    CANCER STAGING:  Cancer Staging  Breast cancer of lower-inner quadrant of right female breast [facility]) Staging form: Breast, AJCC 8th Edition - Pathologic stage from 08/13/2023: Stage Unknown (pT2, pNX, cM0, G2, ER+, PR+, HER2-) - Signed by Rogers Hai, MD on 08/13/2023   INTERVAL HISTORY:   [name], a 89 y.o. female, returns for routine follow-up of her right sided breast cancer.  Sandra Cuevas was last seen on 08/13/2023 by Dr. Rogers.   She  denies any recent surgeries, hospitalizations, or changes in baseline health status.   At today's visit, she reports feeling fair.  She  reports 50% energy and 100% appetite.   She  is maintaining stable weight at this time.  No new onset breast lumps or axillary lymphadenopathy. She denies any right arm lymphedema or new pain (chronic pain and limited ROM  from prior shoulder surgery).  Denies any new aches or pains, headaches, or abdominal pain.  She has some chronic back pain and radiculopathy, following with neuro/ortho.  She continues to take tamoxifen , tolerating well. No major hot flashes or night sweats.  She takes calcium  and vitamin D  daily. She was supposed to get a root canal earlier this year, but cancelled it.  No jaw pain.  No regular dental follow-up.   She is taking Vitamin B12 tablet 1 mg daily. She has been taking iron tablet twice daily since mastectomy. No rectal bleeding or melena.   No history of liver disease.    ASSESSMENT & PLAN:  1.  Stage I (T2 N0 M0 G2 ER/PR+ HER2-) right breast LIQ invasive mammary carcinoma: # S/p right breast mastectomy - CT angiogram chest (04/14/2023): Incidental 17 mm nodular area in the inferior right breast.  Stable mediastinal and right hilar adenopathy.  New nodular densities in the left lower lobe measuring up to 5 mm. - Diagnostic mammogram and ultrasound (07/03/2023): Breast mass measuring 1.9 x 1.4 x 1.9 cm in the right breast at 5 o'clock position.  Ultrasound right axilla with multiple normal-appearing lymph nodes. - Pathology: Invasive mammary carcinoma with extracellular mucin, overall grade 2, HER2 (0), ER 100% strong positive, PR 80% moderate positive, Ki-67 5% - She had history of cervical cancer, s/p TAH and her early 30s - Right simple mastectomy on 07/23/2023 by Dr. Jenkins - Pathology: 3 cm IDC, grade 2, margins negative, negative LVI/PNI - Per Dr. Rogers, no indication for radiation given size of tumor and age - Anastrozole  started on 07/15/2023,  switched to tamoxifen  20 mg daily on 08/13/2023 due to osteoporosis.  Goal of antiestrogen treatment is 5 years (through April 2030) - Physical exam (02/18/2024): Right mastectomy site is well-healed.  Left breast without any discrete nodules or masses.  No axillary lymphadenopathy bilaterally. - Most recent labs (02/11/2024): CBC at  baseline.  Normal LFTs. - No red flag symptoms per history/ROS today. - PLAN: Continue daily tamoxifen .   - Due for left breast screening mammogram in March 2026. - Next breast exam in 6 months (May 2026)  2.  Osteoporosis: - Reviewed DEXA scan from 07/22/2023 indicating osteoporosis with T-score -2.5. - She takes vitamin D  supplement 2000 units daily, also takes calcium  supplement - Role of bisphosphonates, including Fosamax or zoledronic acid (Reclast) were discussed by Dr. Rogers at visit in May 2025.  At that time, patient was planning for upcoming root canal and crown placement.  However, she decided to cancel root canal, and continues to have a bad tooth. - Due to osteoporosis, since she was switched to tamoxifen , rather than anastrozole . - Most recent labs (02/11/2024): Normal calcium  9.0, vitamin D  normal at 39.09 - PLAN: We discussed treatment options for osteoporosis, including Fosamax, Zometa, or Prolia.  Due to risk of ONJ and her current dental issues, she would need dental clearance prior to starting any treatment.  She would like some time to think about this and will let us  know if she decides to proceed with osteoporosis treatment.   - Continue calcium , vitamin D , and weightbearing exercises as tolerated. - Repeat bone density in April 2027  3.  Macrocytic anemia + thrombocytopenia # High suspicion for MDS - Onset of anemia in 2018, progressive since that time.  Macrocytic since 2020. - She did have some acute worsening of her anemia after her mastectomy, with Hgb dropping from 9.2 to 7.6.  Hemoglobin is now back to baseline between 9.0-10.0. - Hematology workup (07/15/2023): Normal LDH, DAT negative. Reticulocytes 1.8% (hypoproliferative for degree of anemia) Normal copper , folate. Marginal B12 375, elevated MMA 393. Normal ferritin 193, iron saturation 17%. - She received vitamin B12 injection in April 2025, started on B12 1 mg tablet daily. - Kidney function is  normal (creatinine 0.88/GFR >60), although she has had AKI's in the past could have put her GFR in the range of CKD stage IIIa - Most recent labs (02/11/2024): Hgb 9.6/MCV 106.9.  Platelets 113.  Normal WBC/differential. Normal kidney function. Ferritin 475, iron saturation 25%. Normal vitamin B12 1011. - No rectal bleeding or melena. - She was told to take iron tablet twice daily after mastectomy in April 2025. - No history of liver disease (CT abdomen/pelvis from December 2020 showed unremarkable liver, normal size spleen).  - DIFFERENTIAL DIAGNOSIS favors likely MDS.   - PLAN:  We discussed strong clinical suspicion for MDS.  Given her age and functional status, we weighed risks and benefits of bone marrow biopsy for definitive diagnosis, and she is not sure if she wants to proceed with bone marrow biopsy at this time. - She is agreeable to checking NGS myeloid panel to assist with assessment and decision regarding bone marrow biopsy. - Patient instructed to STOP iron tablet.  Continue taking vitamin B12 1 mg tablet daily. - We will see her for repeat labs and office visit in 3 months, and will discuss results of NGS myeloid panel at that time. - Will check Cystatin C to see if she has any underlying kidney disease that is not reflected in creatinine.  4.  Pulmonary nodules - Following with Dr. Byrum - CT chest (02/04/2024) with unchanged irregular nodular opacity in anterior right upper lobe (1.1 x 0.6 cm), unchanged compared to June 2025  5.  Social/Family History: - Lives at home alone, ambulates with walker. Independent of ADL's and is able to cook independently. She relies on her daughter for other IADL's.  No tobacco use.  Reports 2-3 falls in the last 1 and half years. - Sister had bone cancer. Brother had leukemia. Other brother had cancer, type unknown.  PLAN SUMMARY: >> Labs today = NGS myeloid panel >> Labs in 3 months = CBC/D, CMP, ferritin, iron/TIBC, B12, MMA, Cystatin C,  LDH >> OFFICE visit in 3 months (1 week after labs)  AFTER NEXT VISIT: - Screening mammogram March 2026 - Bone density scan April 2026 - Breast exam May 2026    REVIEW OF SYSTEMS:   Review of Systems  Constitutional:  Positive for fatigue. Negative for appetite change, chills, diaphoresis, fever and unexpected weight change.  HENT:   Negative for lump/mass and nosebleeds.   Eyes:  Negative for eye problems.  Respiratory:  Positive for shortness of breath (on supplemental oxygen ). Negative for cough and hemoptysis.   Cardiovascular:  Positive for palpitations. Negative for chest pain and leg swelling.  Gastrointestinal:  Positive for abdominal distention (bloating and gas). Negative for abdominal pain, blood in stool, constipation, diarrhea, nausea and vomiting.  Genitourinary:  Negative for hematuria.   Musculoskeletal:  Positive for back pain.  Skin: Negative.   Neurological:  Positive for headaches. Negative for dizziness and light-headedness.  Hematological:  Does not bruise/bleed easily.  Psychiatric/Behavioral:  Positive for sleep disturbance.     PHYSICAL EXAM:   Performance status (ECOG): 3 - Symptomatic, >50% confined to bed  Vitals:   02/18/24 1407 02/18/24 1410  BP: (!) 156/39 (!) 173/67  Pulse: 82   Resp: 19   Temp: 99.8 F (37.7 C)   SpO2: 94%    Wt Readings from Last 3 Encounters:  02/18/24 204 lb (92.5 kg)  01/22/24 205 lb (93 kg)  01/10/24 223 lb 11.2 oz (101.5 kg)   Physical Exam Constitutional:      Appearance: Normal appearance. She is obese.  Cardiovascular:     Heart sounds: Normal heart sounds.  Pulmonary:     Breath sounds: Normal breath sounds.  Chest:     Comments: Right mastectomy site is well-healed.  Left breast without any discrete nodules or masses.  No axillary lymphadenopathy bilaterally. Neurological:     General: No focal deficit present.     Mental Status: Mental status is at baseline.  Psychiatric:        Behavior: Behavior  normal. Behavior is cooperative.      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    COPD (chronic obstructive pulmonary disease) (HCC)    Diabetes mellitus    GERD (gastroesophageal reflux disease)    History of cardiovascular stress test 06/01/2010   EF 71% - no evidence of ischemia, normal left ventricular systolic function   History of hysterectomy 1965   Hyperlipidemia    Hypertension    Hypothyroidism    Shortness of breath    on exertion   Tubular adenoma of colon    Past Surgical History:  Procedure Laterality Date  ABDOMINAL HYSTERECTOMY     BREAST BIOPSY Right 07/08/2023   path pending   BREAST BIOPSY Right 07/08/2023   US  RT BREAST BX W LOC DEV 1ST LESION IMG BX SPEC US  GUIDE 07/08/2023 Lennon Nest, MD [facility]-ULTRASOUND   CARDIAC SURGERY     catherization   HEMORRHOID SURGERY     TOTAL MASTECTOMY Right 07/23/2023   Procedure: MASTECTOMY, SIMPLE;  Surgeon: Mavis Anes, MD;  Location: [facility];  Service: General;  Laterality: Right;   TOTAL SHOULDER REPLACEMENT Right     SOCIAL HISTORY:  Social History   Socioeconomic History   Marital status: Widowed    Spouse name: Not on file   Number of children: 2   Years of education: 8   Highest education level: Not on file  Occupational History   Occupation: Retired  Tobacco Use   Smoking status: Never   Smokeless tobacco: Never  Vaping Use   Vaping status: Never Used  Substance and Sexual Activity   Alcohol  use: No   Drug use: Never   Sexual activity: Not Currently    Birth control/protection: Surgical    Comment: hyst  Other Topics Concern   Not on file  Social History Narrative   Right handed   Tea daily   Lives alone   Social Drivers of Health   Financial Resource Strain: Low Risk  (07/13/2020)   Overall Financial Resource Strain (CARDIA)    Difficulty of Paying Living  Expenses: Not very hard  Food Insecurity: No Food Insecurity (07/23/2023)   Hunger Vital Sign    Worried About Running Out of Food in the Last Year: Never true    Ran Out of Food in the Last Year: Never true  Transportation Needs: No Transportation Needs (07/23/2023)   PRAPARE - Administrator, Civil Service (Medical): No    Lack of Transportation (Non-Medical): No  Physical Activity: Inactive (07/13/2020)   Exercise Vital Sign    Days of Exercise per Week: 0 days    Minutes of Exercise per Session: 0 min  Stress: No Stress Concern Present (07/13/2020)   [employer] of Occupational Health - Occupational Stress Questionnaire    Feeling of Stress : Only a little  Social Connections: Moderately Integrated (07/23/2023)   Social Connection and Isolation Panel    Frequency of Communication with Friends and Family: More than three times a week    Frequency of Social Gatherings with Friends and Family: More than three times a week    Attends Religious Services: More than 4 times per year    Active Member of [employer] or Organizations: Yes    Attends Banker Meetings: More than 4 times per year    Marital Status: Widowed  Intimate Partner Violence: Not At Risk (07/23/2023)   Humiliation, Afraid, Rape, and Kick questionnaire    Fear of Current or Ex-Partner: No    Emotionally Abused: No    Physically Abused: No    Sexually Abused: No    FAMILY HISTORY:  Family History  Problem Relation Age of Onset   Heart disease Father        heart problems   Kidney failure Mother    Diabetes Mother    Leukemia Brother    Diabetes Brother    Colon cancer Sister    Leukemia Brother    Diabetes Brother    Pancreatic cancer Brother    Bone cancer Sister     CURRENT MEDICATIONS:  Current Outpatient Medications  Medication Sig  Dispense Refill   acetaminophen  (TYLENOL ) 500 MG tablet Take 2 tablets (1,000 mg total) by mouth every 6 (six) hours as needed. 30 tablet 0   albuterol   (PROVENTIL ) (2.5 MG/3ML) 0.083% nebulizer solution Take 3 mLs (2.5 mg total) by nebulization every 6 (six) hours as needed for wheezing or shortness of breath. 75 mL 3   amLODipine  (NORVASC ) 5 MG tablet Take 1 tablet (5 mg total) by mouth daily. 30 tablet 0   aspirin  EC 81 MG tablet Take 1 tablet (81 mg total) by mouth daily. Swallow whole.     atorvastatin  (LIPITOR) 40 MG tablet TAKE 1 TABLET(40 MG) BY MOUTH DAILY AT 6 PM 90 tablet 0   azithromycin  (ZITHROMAX ) 250 MG tablet Take 2 on the first day, then take 1 daily until completely gone. 6 tablet 0   calcium  carbonate (OSCAL) 1500 (600 Ca) MG TABS tablet Take 600 mg by mouth at bedtime.     Cholecalciferol (VITAMIN D3 PO) Take 2,000 Units by mouth at bedtime.     docusate sodium  (COLACE) 100 MG capsule Take 100 mg by mouth at bedtime.     famotidine  (PEPCID ) 20 MG tablet Take 1 tablet (20 mg total) by mouth 2 (two) times daily. 180 tablet 1   fluticasone  (FLONASE ) 50 MCG/ACT nasal spray SHAKE LIQUID AND USE 2 SPRAYS IN EACH NOSTRIL DAILY 16 g 5   furosemide  (LASIX ) 20 MG tablet Take 20 mg by mouth daily as needed for fluid.     gabapentin  (NEURONTIN ) 600 MG tablet Take 600 mg by mouth 3 (three) times daily.     ibuprofen (ADVIL) 200 MG tablet Take 200 mg by mouth at bedtime.     levothyroxine  (SYNTHROID , LEVOTHROID) 50 MCG tablet Take 50 mcg by mouth daily before breakfast.     montelukast  (SINGULAIR ) 10 MG tablet Take 10 mg by mouth in the morning.     Multiple Vitamin (MULTIVITAMIN WITH MINERALS) TABS tablet Take 1 tablet by mouth at bedtime.     Multiple Vitamins-Minerals (PRESERVISION AREDS 2+MULTI VIT PO) Take 1 tablet by mouth in the morning.     olopatadine (HM EYE ALLERGY ITCH/RED RELIEF) 0.1 % ophthalmic solution Place 1-2 drops into both eyes 2 (two) times daily as needed for allergies.     omeprazole  (PRILOSEC) 20 MG capsule TAKE 1 CAPSULE(20 MG) BY MOUTH DAILY 90 capsule 3   polyethylene glycol (MIRALAX  / GLYCOLAX ) 17 g packet Take  17 g by mouth daily as needed (constipation).     potassium chloride  (KLOR-CON ) 10 MEQ tablet Take 10 mEq by mouth in the morning.     tamoxifen  (NOLVADEX ) 20 MG tablet Take 1 tablet (20 mg total) by mouth daily. 90 tablet 2   zolpidem  (AMBIEN ) 5 MG tablet Take 5 mg by mouth at bedtime as needed for sleep.     No current facility-administered medications for this visit.    ALLERGIES:  No Known Allergies  LABORATORY DATA:  I have reviewed the labs as listed.     Latest Ref Rng & Units 02/11/2024   11:06 AM 01/10/2024   11:52 AM 07/25/2023    5:01 AM  CBC  WBC 4.0 - 10.5 K/uL 6.8  6.3  10.5   Hemoglobin 12.0 - 15.0 g/dL 9.6  9.8  7.6   Hematocrit 36.0 - 46.0 % 31.0  31.2  25.2   Platelets 150 - 400 K/uL 133  146  163       Latest Ref Rng & Units 02/11/2024  11:06 AM 01/10/2024   11:52 AM 07/25/2023    5:01 AM  CMP  Glucose 70 - 99 mg/dL 768  842  858   BUN 8 - 23 mg/dL 18  13  22    Creatinine 0.44 - 1.00 mg/dL 9.11  9.18  8.93   Sodium 135 - 145 mmol/L 140  143  138   Potassium 3.5 - 5.1 mmol/L 4.7  4.4  4.4   Chloride 98 - 111 mmol/L 95  103  98   CO2 22 - 32 mmol/L 39  34  35   Calcium  8.9 - 10.3 mg/dL 9.0  8.8  9.2   Total Protein 6.5 - 8.1 g/dL 6.3  6.0    Total Bilirubin 0.0 - 1.2 mg/dL 0.5  0.4    Alkaline Phos 38 - 126 U/L 98  84    AST 15 - 41 U/L 25  25    ALT 0 - 44 U/L 18  16      DIAGNOSTIC IMAGING:  I have independently reviewed the scans and discussed with the patient. CT Chest Wo Contrast Result Date: 02/07/2024 CLINICAL DATA:  Follow-up pulmonary nodule EXAM: CT CHEST WITHOUT CONTRAST TECHNIQUE: Multidetector CT imaging of the chest was performed following the standard protocol without IV contrast. RADIATION DOSE REDUCTION: This exam was performed according to the departmental dose-optimization program which includes automated exposure control, adjustment of the mA and/or kV according to patient size and/or use of iterative reconstruction technique.  COMPARISON:  CT chest dated 09/15/2023 and multiple priors FINDINGS: Cardiovascular: Mild multichamber cardiomegaly. No significant pericardial fluid/thickening. Great vessels are normal in course and caliber. Coronary artery calcifications and aortic atherosclerosis. Severe mitral annular calcification. Mediastinum/Nodes: Imaged thyroid  gland without nodules meeting criteria for imaging follow-up by size. Normal esophagus. Unchanged 11 mm precarinal lymph node (2:53). Lungs/Pleura: The central airways are patent. Pleural-parenchymal scarring. Unchanged irregular nodular opacity in the anterior right upper lobe measuring 1.1 x 0.6 cm (4:39). Mild diffuse mosaic attenuation. Unchanged linear plate-like atelectasis/scarring in the right middle lobe and lingula. Unchanged 5 mm left lower lobe nodule (4:99) dating back to at least 08/10/2019, likely benign. 9 x 5 mm basilar right lower lobe nodule (4:116) is also unchanged dating back to at least 03/20/2019, likely benign. No pneumothorax. Trace left pleural effusion. Upper abdomen: Normal. Musculoskeletal: No acute or abnormal lytic or blastic osseous lesions. Partially imaged right shoulder arthroplasty hardware appears intact. Multilevel degenerative changes of the thoracic spine. IMPRESSION: 1. Unchanged irregular nodular opacity in the anterior right upper lobe. 2. Mild diffuse mosaic attenuation, which can be seen in the setting of small airways disease. 3. Trace left pleural effusion. 4. Aortic Atherosclerosis (ICD10-I70.0). Coronary artery calcifications. Assessment for potential risk factor modification, dietary therapy or pharmacologic therapy may be warranted, if clinically indicated. Electronically Signed   By: Limin  Xu M.D.   On: 02/07/2024 14:48   VAS US  LOWER EXTREMITY VENOUS (DVT) Result Date: 01/28/2024  Lower Venous DVT Study Patient Name:  AINARA ELDRIDGE  Date of Exam:   01/28/2024 Medical Rec #: [id number]      Accession #:    [id number] Date of  Birth: 1928-10-04       Patient Gender: F Patient Age:   88 years Exam Location:  [address] Procedure:      VAS US  LOWER EXTREMITY VENOUS (DVT) Referring Phys: JOSHUA CHADWELL --------------------------------------------------------------------------------  Other Indications: Left lower extremity pain and swelling. Performing Technologist: Edsel Mustard RVT  Examination Guidelines: A complete evaluation  includes B-mode imaging, spectral Doppler, color Doppler, and power Doppler as needed of all accessible portions of each vessel. Bilateral testing is considered an integral part of a complete examination. Limited examinations for reoccurring indications may be performed as noted. The reflux portion of the exam is performed with the patient in reverse Trendelenburg.  +-----+---------------+---------+-----------+----------+--------------+ RIGHTCompressibilityPhasicitySpontaneityPropertiesThrombus Aging +-----+---------------+---------+-----------+----------+--------------+ CFV  Full           Yes      Yes                                 +-----+---------------+---------+-----------+----------+--------------+   +---------+---------------+---------+-----------+----------+--------------+ LEFT     CompressibilityPhasicitySpontaneityPropertiesThrombus Aging +---------+---------------+---------+-----------+----------+--------------+ CFV      Full           Yes      Yes                                 +---------+---------------+---------+-----------+----------+--------------+ SFJ      Full           Yes      Yes                                 +---------+---------------+---------+-----------+----------+--------------+ FV Prox  Full           Yes      Yes                                 +---------+---------------+---------+-----------+----------+--------------+ FV Mid   Full           Yes      Yes                                  +---------+---------------+---------+-----------+----------+--------------+ FV DistalFull           Yes      Yes                                 +---------+---------------+---------+-----------+----------+--------------+ PFV      Full                                                        +---------+---------------+---------+-----------+----------+--------------+ POP      Full           Yes      Yes                                 +---------+---------------+---------+-----------+----------+--------------+ PTV      Full                                                        +---------+---------------+---------+-----------+----------+--------------+ PERO     Full                                                        +---------+---------------+---------+-----------+----------+--------------+  Gastroc  Full                                                        +---------+---------------+---------+-----------+----------+--------------+ GSV      Full                                                        +---------+---------------+---------+-----------+----------+--------------+ Cystic structure in the left popliteal fossa measuring 2.48 cm x 1.65 cm    Summary: RIGHT: - No evidence of common femoral vein obstruction.   LEFT: - No evidence of deep vein thrombosis in the lower extremity. No indirect evidence of obstruction proximal to the inguinal ligament.  - A cystic structure is found in the popliteal fossa.  *See table(s) above for measurements and observations. Electronically signed by Penne Colorado MD on 01/28/2024 at 4:09:12 PM.    Final      WRAP UP:  All questions were answered. The patient knows to call the clinic with any problems, questions or concerns.  Medical decision making: High  Time spent on visit: I spent 30 minutes counseling the patient face to face. The total time spent in the appointment was 55 minutes and more than 50% was on  counseling.  Pleasant Sandra Barefoot, PA-C  02/18/24 5:01 PM

## 2024-02-18 ENCOUNTER — Inpatient Hospital Stay: Admitting: Physician Assistant

## 2024-02-18 ENCOUNTER — Inpatient Hospital Stay

## 2024-02-18 VITALS — BP 173/67 | HR 82 | Temp 99.8°F | Resp 19 | Ht 66.0 in | Wt 204.0 lb

## 2024-02-18 DIAGNOSIS — M810 Age-related osteoporosis without current pathological fracture: Secondary | ICD-10-CM | POA: Diagnosis not present

## 2024-02-18 DIAGNOSIS — E538 Deficiency of other specified B group vitamins: Secondary | ICD-10-CM | POA: Diagnosis not present

## 2024-02-18 DIAGNOSIS — D539 Nutritional anemia, unspecified: Secondary | ICD-10-CM

## 2024-02-18 DIAGNOSIS — C50311 Malignant neoplasm of lower-inner quadrant of right female breast: Secondary | ICD-10-CM | POA: Diagnosis not present

## 2024-02-18 DIAGNOSIS — Z170 Estrogen receptor positive status [ER+]: Secondary | ICD-10-CM

## 2024-02-18 NOTE — Patient Instructions (Addendum)
 [facility] at [facility] **VISIT SUMMARY & IMPORTANT INSTRUCTIONS **   You were seen today by Pleasant Barefoot PA-C for your follow-up visit.    HISTORY OF RIGHT BREAST CANCER  Your labs and physical exam did not show any evidence of recurrent breast cancer. Continue to take tamoxifen  daily. Your next screening mammogram is due in March 2026. We we will check another breast exam in 6 months (May 2026).   OSTEOPOROSIS You have weak bones that are more likely to break.  This is called osteoporosis. Osteoporosis is caused by postmenopausal status, age, and breast cancer treatment. Before receiving any treatment for osteoporosis (Zometa, Fosamax, or Prolia), you will need to see your dentist to make sure that you are not at risk for a rare but serious side effect called osteonecrosis of the jaw. Continue taking calcium  and vitamin D .  Continue weightbearing exercises as tolerated to strengthen your bones.  **POSSIBLE** MYELODYSPLASTIC SYNDROME You have had progressive anemia and low platelets for several years. This may (or may not) indicate an underlying bone marrow disorder called myelodysplastic syndrome (MDS).  This is a type of low-grade bone marrow/blood cancer that can cause low blood counts in older adults.  It causes your bone marrow to be unable to produce healthy blood cells. The only way to diagnose myelodysplastic syndrome is with a bone marrow biopsy. For the time being, we will check a genetic blood test (NGS myeloid panel), which will tell us  if you have any genetic mutations that would make MDS more likely. We will check your blood and discussed these results again in 3 months.  STOP taking iron (ferrous sulfate ). CONTINUE taking vitamin B12 1000 mcg tablet daily.  FOLLOW-UP APPOINTMENT: 3 months  ** Thank you for trusting me with your healthcare!  I strive to provide all of my patients with quality care at each visit.  If you receive a survey for  this visit, I would be so grateful to you for taking the time to provide feedback.  Thank you in advance!  ~ Amado Andal                                        Dr. Mickiel Davonna Pleasant Barefoot, PA-C          Delon Hope, NP   - - - - - - - - - - - - - - - - - -    Thank you for choosing [facility] at [facility] to provide your oncology and hematology care.  To afford each patient quality time with our provider, please arrive at least 15 minutes before your scheduled appointment time.   If you have a lab appointment with the Cancer Center please come in thru the Main Entrance and check in at the main information desk.  You need to re-schedule your appointment should you arrive 10 or more minutes late.  We strive to give you quality time with our providers, and arriving late affects you and other patients whose appointments are after yours.  Also, if you no show three or more times for appointments you may be dismissed from the clinic at the providers discretion.     Again, thank you for choosing [facility].  Our hope is that these requests will decrease the amount of time that you wait  before being seen by our physicians.       _____________________________________________________________  Should you have questions after your visit to [facility], please contact our office at [phone] and follow the prompts.  Our office hours are 8:00 a.m. and 4:30 p.m. Monday - Friday.  Please note that voicemails left after 4:00 p.m. may not be returned until the following business day.  We are closed weekends and major holidays.  You do have access to a nurse 24-7, just call the main number to the clinic [phone] and do not press any options, hold on the line and a nurse will answer the phone.    For prescription refill requests, have your pharmacy contact our office and allow 72 hours.

## 2024-02-23 ENCOUNTER — Other Ambulatory Visit: Payer: Self-pay | Admitting: *Deleted

## 2024-02-23 DIAGNOSIS — M5416 Radiculopathy, lumbar region: Secondary | ICD-10-CM | POA: Diagnosis not present

## 2024-02-23 DIAGNOSIS — M79605 Pain in left leg: Secondary | ICD-10-CM | POA: Diagnosis not present

## 2024-02-23 DIAGNOSIS — M7989 Other specified soft tissue disorders: Secondary | ICD-10-CM | POA: Diagnosis not present

## 2024-02-23 DIAGNOSIS — M7122 Synovial cyst of popliteal space [Baker], left knee: Secondary | ICD-10-CM | POA: Diagnosis not present

## 2024-02-23 DIAGNOSIS — M25552 Pain in left hip: Secondary | ICD-10-CM | POA: Diagnosis not present

## 2024-02-23 MED ORDER — TAMOXIFEN CITRATE 20 MG PO TABS: 20.0000 mg | ORAL_TABLET | Freq: Every day | ORAL | 2 refills | Status: DC

## 2024-02-23 NOTE — Telephone Encounter (Signed)
 Patient tolerating Tamoxifen  without noted difficulty.  To continue therapy per Pleasant Barefoot, PAC's last OVN.

## 2024-02-25 ENCOUNTER — Other Ambulatory Visit: Payer: Self-pay | Admitting: *Deleted

## 2024-02-25 DIAGNOSIS — C50311 Malignant neoplasm of lower-inner quadrant of right female breast: Secondary | ICD-10-CM

## 2024-02-25 MED ORDER — TAMOXIFEN CITRATE 20 MG PO TABS: 20.0000 mg | ORAL_TABLET | Freq: Every day | ORAL | 2 refills | Status: DC

## 2024-02-25 NOTE — Telephone Encounter (Signed)
 Patient is tolerating Tamoxifen  and is to continue therapy per last OVN.

## 2024-02-27 ENCOUNTER — Emergency Department (HOSPITAL_COMMUNITY)

## 2024-02-27 ENCOUNTER — Emergency Department (HOSPITAL_COMMUNITY)
Admission: EM | Admit: 2024-02-27 | Discharge: 2024-02-27 | Disposition: A | Discharge status: Home / Self Care | Attending: Emergency Medicine | Admitting: Emergency Medicine

## 2024-02-27 DIAGNOSIS — E039 Hypothyroidism, unspecified: Secondary | ICD-10-CM | POA: Insufficient documentation

## 2024-02-27 DIAGNOSIS — Z7951 Long term (current) use of inhaled steroids: Secondary | ICD-10-CM | POA: Diagnosis not present

## 2024-02-27 DIAGNOSIS — Z471 Aftercare following joint replacement surgery: Secondary | ICD-10-CM | POA: Diagnosis not present

## 2024-02-27 DIAGNOSIS — M25522 Pain in left elbow: Secondary | ICD-10-CM | POA: Diagnosis not present

## 2024-02-27 DIAGNOSIS — R109 Unspecified abdominal pain: Secondary | ICD-10-CM | POA: Insufficient documentation

## 2024-02-27 DIAGNOSIS — E1122 Type 2 diabetes mellitus with diabetic chronic kidney disease: Secondary | ICD-10-CM | POA: Diagnosis not present

## 2024-02-27 DIAGNOSIS — Z9981 Dependence on supplemental oxygen: Secondary | ICD-10-CM | POA: Insufficient documentation

## 2024-02-27 DIAGNOSIS — J90 Pleural effusion, not elsewhere classified: Secondary | ICD-10-CM | POA: Diagnosis not present

## 2024-02-27 DIAGNOSIS — J449 Chronic obstructive pulmonary disease, unspecified: Secondary | ICD-10-CM | POA: Insufficient documentation

## 2024-02-27 DIAGNOSIS — S20212A Contusion of left front wall of thorax, initial encounter: Secondary | ICD-10-CM | POA: Diagnosis not present

## 2024-02-27 DIAGNOSIS — I509 Heart failure, unspecified: Secondary | ICD-10-CM | POA: Diagnosis not present

## 2024-02-27 DIAGNOSIS — I6782 Cerebral ischemia: Secondary | ICD-10-CM | POA: Diagnosis not present

## 2024-02-27 DIAGNOSIS — N189 Chronic kidney disease, unspecified: Secondary | ICD-10-CM | POA: Diagnosis not present

## 2024-02-27 DIAGNOSIS — R519 Headache, unspecified: Secondary | ICD-10-CM | POA: Insufficient documentation

## 2024-02-27 DIAGNOSIS — I130 Hypertensive heart and chronic kidney disease with heart failure and stage 1 through stage 4 chronic kidney disease, or unspecified chronic kidney disease: Secondary | ICD-10-CM | POA: Diagnosis not present

## 2024-02-27 DIAGNOSIS — M19042 Primary osteoarthritis, left hand: Secondary | ICD-10-CM | POA: Diagnosis not present

## 2024-02-27 DIAGNOSIS — M542 Cervicalgia: Secondary | ICD-10-CM | POA: Diagnosis not present

## 2024-02-27 DIAGNOSIS — S60222A Contusion of left hand, initial encounter: Secondary | ICD-10-CM | POA: Diagnosis not present

## 2024-02-27 DIAGNOSIS — R609 Edema, unspecified: Secondary | ICD-10-CM | POA: Diagnosis not present

## 2024-02-27 DIAGNOSIS — Z79899 Other long term (current) drug therapy: Secondary | ICD-10-CM | POA: Diagnosis not present

## 2024-02-27 DIAGNOSIS — Z79890 Hormone replacement therapy (postmenopausal): Secondary | ICD-10-CM | POA: Diagnosis not present

## 2024-02-27 DIAGNOSIS — M4312 Spondylolisthesis, cervical region: Secondary | ICD-10-CM | POA: Diagnosis not present

## 2024-02-27 DIAGNOSIS — R079 Chest pain, unspecified: Secondary | ICD-10-CM

## 2024-02-27 DIAGNOSIS — Z7982 Long term (current) use of aspirin: Secondary | ICD-10-CM | POA: Insufficient documentation

## 2024-02-27 DIAGNOSIS — S0990XA Unspecified injury of head, initial encounter: Secondary | ICD-10-CM | POA: Diagnosis not present

## 2024-02-27 DIAGNOSIS — I10 Essential (primary) hypertension: Secondary | ICD-10-CM | POA: Diagnosis not present

## 2024-02-27 DIAGNOSIS — Z853 Personal history of malignant neoplasm of breast: Secondary | ICD-10-CM | POA: Insufficient documentation

## 2024-02-27 DIAGNOSIS — R0789 Other chest pain: Secondary | ICD-10-CM | POA: Diagnosis not present

## 2024-02-27 DIAGNOSIS — M47812 Spondylosis without myelopathy or radiculopathy, cervical region: Secondary | ICD-10-CM | POA: Diagnosis not present

## 2024-02-27 DIAGNOSIS — S299XXA Unspecified injury of thorax, initial encounter: Secondary | ICD-10-CM | POA: Diagnosis not present

## 2024-02-27 DIAGNOSIS — S6992XA Unspecified injury of left wrist, hand and finger(s), initial encounter: Secondary | ICD-10-CM | POA: Diagnosis not present

## 2024-02-27 DIAGNOSIS — J9811 Atelectasis: Secondary | ICD-10-CM | POA: Diagnosis not present

## 2024-02-27 DIAGNOSIS — T148XXA Other injury of unspecified body region, initial encounter: Secondary | ICD-10-CM

## 2024-02-27 DIAGNOSIS — Y92410 Unspecified street and highway as the place of occurrence of the external cause: Secondary | ICD-10-CM | POA: Diagnosis not present

## 2024-02-27 DIAGNOSIS — S3993XA Unspecified injury of pelvis, initial encounter: Secondary | ICD-10-CM | POA: Diagnosis not present

## 2024-02-27 DIAGNOSIS — S3991XA Unspecified injury of abdomen, initial encounter: Secondary | ICD-10-CM | POA: Diagnosis not present

## 2024-02-27 DIAGNOSIS — I4510 Unspecified right bundle-branch block: Secondary | ICD-10-CM | POA: Diagnosis not present

## 2024-02-27 DIAGNOSIS — S59902A Unspecified injury of left elbow, initial encounter: Secondary | ICD-10-CM | POA: Diagnosis not present

## 2024-02-27 LAB — COMPREHENSIVE METABOLIC PANEL WITH GFR
ALT: 18 U/L (ref 0–44)
AST: 24 U/L (ref 15–41)
Albumin: 3.1 g/dL — ABNORMAL LOW (ref 3.5–5.0)
Alkaline Phosphatase: 66 U/L (ref 38–126)
Anion gap: 7 (ref 5–15)
BUN: 16 mg/dL (ref 8–23)
CO2: 41 mmol/L — ABNORMAL HIGH (ref 22–32)
Calcium: 9 mg/dL (ref 8.9–10.3)
Chloride: 94 mmol/L — ABNORMAL LOW (ref 98–111)
Creatinine, Ser: 0.88 mg/dL (ref 0.44–1.00)
GFR, Estimated: >60 mL/min (ref >60)
Glucose, Bld: 151 mg/dL — ABNORMAL HIGH (ref 70–99)
Potassium: 4.5 mmol/L (ref 3.5–5.1)
Sodium: 142 mmol/L (ref 135–145)
Total Bilirubin: 0.8 mg/dL (ref 0.0–1.2)
Total Protein: 6.1 g/dL — ABNORMAL LOW (ref 6.5–8.1)

## 2024-02-27 LAB — I-STAT CHEM 8, ED
BUN: 21 mg/dL (ref 8–23)
Calcium, Ion: 1.16 mmol/L (ref 1.15–1.40)
Chloride: 91 mmol/L — ABNORMAL LOW (ref 98–111)
Creatinine, Ser: 1.1 mg/dL — ABNORMAL HIGH (ref 0.44–1.00)
Glucose, Bld: 153 mg/dL — ABNORMAL HIGH (ref 70–99)
HCT: 27 % — ABNORMAL LOW (ref 36.0–46.0)
Hemoglobin: 9.2 g/dL — ABNORMAL LOW (ref 12.0–15.0)
Potassium: 4.6 mmol/L (ref 3.5–5.1)
Sodium: 138 mmol/L (ref 135–145)
TCO2: 42 mmol/L — ABNORMAL HIGH (ref 22–32)

## 2024-02-27 LAB — CBC
HCT: 30.4 % — ABNORMAL LOW (ref 36.0–46.0)
Hemoglobin: 9.1 g/dL — ABNORMAL LOW (ref 12.0–15.0)
MCH: 32.3 pg (ref 26.0–34.0)
MCHC: 29.9 g/dL — ABNORMAL LOW (ref 30.0–36.0)
MCV: 107.8 fL — ABNORMAL HIGH (ref 80.0–100.0)
Platelets: 124 K/uL — ABNORMAL LOW (ref 150–400)
RBC: 2.82 MIL/uL — ABNORMAL LOW (ref 3.87–5.11)
RDW: 14.1 % (ref 11.5–15.5)
WBC: 7 K/uL (ref 4.0–10.5)
nRBC: 0 % (ref 0.0–0.2)

## 2024-02-27 LAB — URINALYSIS, ROUTINE W REFLEX MICROSCOPIC
Bilirubin Urine: NEGATIVE
Glucose, UA: NEGATIVE mg/dL
Hgb urine dipstick: NEGATIVE
Ketones, ur: NEGATIVE mg/dL
Leukocytes,Ua: NEGATIVE
Nitrite: NEGATIVE
Protein, ur: NEGATIVE mg/dL
Specific Gravity, Urine: 1.026 (ref 1.005–1.030)
pH: 6 (ref 5.0–8.0)

## 2024-02-27 LAB — SAMPLE TO BLOOD BANK

## 2024-02-27 LAB — PROTIME-INR
INR: 1 (ref 0.8–1.2)
Prothrombin Time: 13.5 s (ref 11.4–15.2)

## 2024-02-27 LAB — I-STAT CG4 LACTIC ACID, ED: Lactic Acid, Venous: 0.9 mmol/L (ref 0.5–1.9)

## 2024-02-27 LAB — ETHANOL: Alcohol, Ethyl (B): <15 mg/dL (ref <15)

## 2024-02-27 MED ORDER — FENTANYL CITRATE (PF) 50 MCG/ML IJ SOSY
50.0000 ug | PREFILLED_SYRINGE | Freq: Once | INTRAMUSCULAR | Status: AC
  Administered 2024-02-27: 50 ug via INTRAVENOUS
  Filled 2024-02-27: qty 1

## 2024-02-27 MED ORDER — IOHEXOL 350 MG/ML SOLN
75.0000 mL | Freq: Once | INTRAVENOUS | Status: AC | PRN
  Administered 2024-02-27: 75 mL via INTRAVENOUS

## 2024-02-27 MED ORDER — ACETAMINOPHEN 500 MG PO TABS
1000.0000 mg | ORAL_TABLET | Freq: Once | ORAL | Status: AC
  Administered 2024-02-27: 1000 mg via ORAL
  Filled 2024-02-27: qty 2
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
## 2024-02-27 NOTE — Discharge Instructions (Signed)
 CT scans without any acute findings which is very reassuring.  Expect to be very sore and stiff for the next few days.  Continue wear your 3 L of oxygen .  Take extra strength Tylenol  2 tablets every 8 hours for pain.  Follow-up with your doctor as needed.
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
## 2024-02-27 NOTE — ED Notes (Signed)
 [facility] was called and notified
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
## 2024-02-27 NOTE — ED Notes (Signed)
 A watch and a ring was put in a cup and placed in the pt personal black purse.

## 2024-03-02 LAB — MYELOID NGS

## 2024-03-04 ENCOUNTER — Inpatient Hospital Stay (HOSPITAL_COMMUNITY)
Admission: EM | Admit: 2024-03-04 | Discharge: 2024-03-08 | DRG: 193 | Disposition: A | Discharge status: Skilled Nursing Facility | Attending: Internal Medicine | Admitting: Internal Medicine

## 2024-03-04 ENCOUNTER — Emergency Department (HOSPITAL_COMMUNITY)

## 2024-03-04 ENCOUNTER — Other Ambulatory Visit: Payer: Self-pay

## 2024-03-04 DIAGNOSIS — Z66 Do not resuscitate: Secondary | ICD-10-CM | POA: Diagnosis present

## 2024-03-04 DIAGNOSIS — R0989 Other specified symptoms and signs involving the circulatory and respiratory systems: Secondary | ICD-10-CM | POA: Diagnosis not present

## 2024-03-04 DIAGNOSIS — E66811 Obesity, class 1: Secondary | ICD-10-CM | POA: Diagnosis present

## 2024-03-04 DIAGNOSIS — Z1152 Encounter for screening for COVID-19: Secondary | ICD-10-CM

## 2024-03-04 DIAGNOSIS — R0602 Shortness of breath: Secondary | ICD-10-CM | POA: Diagnosis not present

## 2024-03-04 DIAGNOSIS — R7989 Other specified abnormal findings of blood chemistry: Secondary | ICD-10-CM | POA: Diagnosis not present

## 2024-03-04 DIAGNOSIS — I471 Supraventricular tachycardia, unspecified: Secondary | ICD-10-CM | POA: Diagnosis not present

## 2024-03-04 DIAGNOSIS — I700 Atherosclerosis of aorta: Secondary | ICD-10-CM | POA: Diagnosis not present

## 2024-03-04 DIAGNOSIS — Z90710 Acquired absence of both cervix and uterus: Secondary | ICD-10-CM

## 2024-03-04 DIAGNOSIS — Z8249 Family history of ischemic heart disease and other diseases of the circulatory system: Secondary | ICD-10-CM

## 2024-03-04 DIAGNOSIS — Z96611 Presence of right artificial shoulder joint: Secondary | ICD-10-CM | POA: Diagnosis present

## 2024-03-04 DIAGNOSIS — I110 Hypertensive heart disease with heart failure: Secondary | ICD-10-CM | POA: Diagnosis not present

## 2024-03-04 DIAGNOSIS — E669 Obesity, unspecified: Secondary | ICD-10-CM | POA: Insufficient documentation

## 2024-03-04 DIAGNOSIS — Z806 Family history of leukemia: Secondary | ICD-10-CM

## 2024-03-04 DIAGNOSIS — R0600 Dyspnea, unspecified: Secondary | ICD-10-CM | POA: Diagnosis not present

## 2024-03-04 DIAGNOSIS — J9621 Acute and chronic respiratory failure with hypoxia: Secondary | ICD-10-CM | POA: Diagnosis present

## 2024-03-04 DIAGNOSIS — J441 Chronic obstructive pulmonary disease with (acute) exacerbation: Secondary | ICD-10-CM | POA: Insufficient documentation

## 2024-03-04 DIAGNOSIS — I5020 Unspecified systolic (congestive) heart failure: Principal | ICD-10-CM

## 2024-03-04 DIAGNOSIS — I959 Hypotension, unspecified: Secondary | ICD-10-CM | POA: Diagnosis present

## 2024-03-04 DIAGNOSIS — C50911 Malignant neoplasm of unspecified site of right female breast: Secondary | ICD-10-CM | POA: Diagnosis present

## 2024-03-04 DIAGNOSIS — I509 Heart failure, unspecified: Secondary | ICD-10-CM | POA: Diagnosis not present

## 2024-03-04 DIAGNOSIS — E039 Hypothyroidism, unspecified: Secondary | ICD-10-CM | POA: Diagnosis present

## 2024-03-04 DIAGNOSIS — J9611 Chronic respiratory failure with hypoxia: Secondary | ICD-10-CM | POA: Diagnosis present

## 2024-03-04 DIAGNOSIS — J9622 Acute and chronic respiratory failure with hypercapnia: Secondary | ICD-10-CM | POA: Diagnosis not present

## 2024-03-04 DIAGNOSIS — Z79899 Other long term (current) drug therapy: Secondary | ICD-10-CM

## 2024-03-04 DIAGNOSIS — Z6832 Body mass index (BMI) 32.0-32.9, adult: Secondary | ICD-10-CM

## 2024-03-04 DIAGNOSIS — Z79890 Hormone replacement therapy (postmenopausal): Secondary | ICD-10-CM

## 2024-03-04 DIAGNOSIS — I5031 Acute diastolic (congestive) heart failure: Secondary | ICD-10-CM | POA: Diagnosis not present

## 2024-03-04 DIAGNOSIS — J189 Pneumonia, unspecified organism: Principal | ICD-10-CM | POA: Diagnosis present

## 2024-03-04 DIAGNOSIS — E782 Mixed hyperlipidemia: Secondary | ICD-10-CM | POA: Diagnosis present

## 2024-03-04 DIAGNOSIS — E1165 Type 2 diabetes mellitus with hyperglycemia: Secondary | ICD-10-CM | POA: Diagnosis present

## 2024-03-04 DIAGNOSIS — Z800 Family history of malignant neoplasm of digestive organs: Secondary | ICD-10-CM

## 2024-03-04 DIAGNOSIS — I5033 Acute on chronic diastolic (congestive) heart failure: Secondary | ICD-10-CM | POA: Diagnosis not present

## 2024-03-04 DIAGNOSIS — Z8419 Family history of other disorders of kidney and ureter: Secondary | ICD-10-CM

## 2024-03-04 DIAGNOSIS — K219 Gastro-esophageal reflux disease without esophagitis: Secondary | ICD-10-CM | POA: Diagnosis present

## 2024-03-04 DIAGNOSIS — J440 Chronic obstructive pulmonary disease with acute lower respiratory infection: Secondary | ICD-10-CM | POA: Diagnosis present

## 2024-03-04 DIAGNOSIS — Z7982 Long term (current) use of aspirin: Secondary | ICD-10-CM

## 2024-03-04 DIAGNOSIS — Z833 Family history of diabetes mellitus: Secondary | ICD-10-CM

## 2024-03-04 DIAGNOSIS — Z9011 Acquired absence of right breast and nipple: Secondary | ICD-10-CM

## 2024-03-04 DIAGNOSIS — Z79810 Long term (current) use of selective estrogen receptor modulators (SERMs): Secondary | ICD-10-CM

## 2024-03-04 LAB — RESPIRATORY PANEL BY PCR

## 2024-03-04 LAB — GLUCOSE, CAPILLARY
Glucose-Capillary: 223 mg/dL — ABNORMAL HIGH (ref 70–99)
Glucose-Capillary: 177 mg/dL — ABNORMAL HIGH (ref 70–99)

## 2024-03-04 LAB — CBC WITH DIFFERENTIAL/PLATELET
Abs Immature Granulocytes: 0.08 K/uL — ABNORMAL HIGH (ref 0.00–0.07)
Basophils Absolute: 0.1 K/uL (ref 0.0–0.1)
Basophils Relative: 0 %
Eosinophils Absolute: 0.3 K/uL (ref 0.0–0.5)
Eosinophils Relative: 2 %
HCT: 32.7 % — ABNORMAL LOW (ref 36.0–46.0)
Hemoglobin: 9.6 g/dL — ABNORMAL LOW (ref 12.0–15.0)
Immature Granulocytes: 1 %
Lymphocytes Relative: 31 %
Lymphs Abs: 4.2 K/uL — ABNORMAL HIGH (ref 0.7–4.0)
MCH: 32.2 pg (ref 26.0–34.0)
MCHC: 29.4 g/dL — ABNORMAL LOW (ref 30.0–36.0)
MCV: 109.7 fL — ABNORMAL HIGH (ref 80.0–100.0)
Monocytes Absolute: 1.3 K/uL — ABNORMAL HIGH (ref 0.1–1.0)
Monocytes Relative: 10 %
Neutro Abs: 7.6 K/uL (ref 1.7–7.7)
Neutrophils Relative %: 56 %
Platelets: 110 K/uL — ABNORMAL LOW (ref 150–400)
RBC: 2.98 MIL/uL — ABNORMAL LOW (ref 3.87–5.11)
RDW: 14.8 % (ref 11.5–15.5)
WBC: 13.6 K/uL — ABNORMAL HIGH (ref 4.0–10.5)
nRBC: 0.1 % (ref 0.0–0.2)

## 2024-03-04 LAB — COMPREHENSIVE METABOLIC PANEL WITH GFR
ALT: 19 U/L (ref 0–44)
AST: 32 U/L (ref 15–41)
Albumin: 3.6 g/dL (ref 3.5–5.0)
Alkaline Phosphatase: 105 U/L (ref 38–126)
Anion gap: 4 — ABNORMAL LOW (ref 5–15)
BUN: 18 mg/dL (ref 8–23)
CO2: 40 mmol/L — ABNORMAL HIGH (ref 22–32)
Calcium: 9.4 mg/dL (ref 8.9–10.3)
Chloride: 100 mmol/L (ref 98–111)
Creatinine, Ser: 0.81 mg/dL (ref 0.44–1.00)
GFR, Estimated: >60 mL/min (ref >60)
Glucose, Bld: 199 mg/dL — ABNORMAL HIGH (ref 70–99)
Potassium: 5 mmol/L (ref 3.5–5.1)
Sodium: 144 mmol/L (ref 135–145)
Total Bilirubin: 0.5 mg/dL (ref 0.0–1.2)
Total Protein: 6.5 g/dL (ref 6.5–8.1)

## 2024-03-04 LAB — BLOOD GAS, VENOUS
Acid-Base Excess: 10.6 mmol/L — ABNORMAL HIGH (ref 0.0–2.0)
Acid-Base Excess: 13.1 mmol/L — ABNORMAL HIGH (ref 0.0–2.0)
Bicarbonate: 44.1 mmol/L — ABNORMAL HIGH (ref 20.0–28.0)
Bicarbonate: 45.6 mmol/L — ABNORMAL HIGH (ref 20.0–28.0)
Drawn by: 6051
Drawn by: 7049
O2 Saturation: 90 %
O2 Saturation: 76.7 %
Patient temperature: 37.7
Patient temperature: 36.3
pCO2, Ven: >123 mmHg (ref 44–60)
pCO2, Ven: 106 mmHg (ref 44–60)
pH, Ven: 7.16 — CL (ref 7.25–7.43)
pH, Ven: 7.24 — ABNORMAL LOW (ref 7.25–7.43)
pO2, Ven: 66 mmHg — ABNORMAL HIGH (ref 32–45)
pO2, Ven: 45 mmHg (ref 32–45)

## 2024-03-04 LAB — PRO BRAIN NATRIURETIC PEPTIDE: Pro Brain Natriuretic Peptide: 702 pg/mL — ABNORMAL HIGH (ref <300.0)

## 2024-03-04 LAB — LACTIC ACID, PLASMA
Lactic Acid, Venous: 0.7 mmol/L (ref 0.5–1.9)
Lactic Acid, Venous: 1.2 mmol/L (ref 0.5–1.9)

## 2024-03-04 LAB — TROPONIN T, HIGH SENSITIVITY
Troponin T High Sensitivity: 37 ng/L — ABNORMAL HIGH (ref 0–19)
Troponin T High Sensitivity: 238 ng/L (ref 0–19)
Troponin T High Sensitivity: 291 ng/L (ref 0–19)

## 2024-03-04 LAB — PROCALCITONIN: Procalcitonin: 0.11 ng/mL

## 2024-03-04 LAB — SARS CORONAVIRUS 2 BY RT PCR: SARS Coronavirus 2 by RT PCR: NEGATIVE

## 2024-03-04 MED ORDER — METHYLPREDNISOLONE SODIUM SUCC 125 MG IJ SOLR
125.0000 mg | Freq: Once | INTRAMUSCULAR | Status: AC
  Administered 2024-03-04: 125 mg via INTRAVENOUS
  Filled 2024-03-04: qty 2

## 2024-03-04 MED ORDER — FUROSEMIDE 10 MG/ML IJ SOLN
40.0000 mg | Freq: Once | INTRAMUSCULAR | Status: AC
  Administered 2024-03-04: 40 mg via INTRAVENOUS
  Filled 2024-03-04: qty 4

## 2024-03-04 MED ORDER — MAGNESIUM SULFATE 2 GM/50ML IV SOLN
2.0000 g | Freq: Once | INTRAVENOUS | Status: AC
  Administered 2024-03-04: 2 g via INTRAVENOUS
  Filled 2024-03-04: qty 50

## 2024-03-04 MED ORDER — SODIUM CHLORIDE 0.9 % IV SOLN
2.0000 g | INTRAVENOUS | Status: DC
  Administered 2024-03-05 – 2024-03-08 (×4): 2 g via INTRAVENOUS
  Filled 2024-03-04 (×4): qty 20

## 2024-03-04 MED ORDER — ACETAMINOPHEN 325 MG PO TABS: 650.0000 mg | ORAL_TABLET | ORAL | Status: DC | PRN

## 2024-03-04 MED ORDER — INSULIN ASPART 100 UNIT/ML IJ SOLN
0.0000 [IU] | Freq: Three times a day (TID) | INTRAMUSCULAR | Status: DC
  Administered 2024-03-04: 3 [IU] via SUBCUTANEOUS
  Administered 2024-03-05: 2 [IU] via SUBCUTANEOUS
  Administered 2024-03-05: 3 [IU] via SUBCUTANEOUS
  Administered 2024-03-05: 2 [IU] via SUBCUTANEOUS
  Administered 2024-03-06: 3 [IU] via SUBCUTANEOUS
  Administered 2024-03-06 (×2): 5 [IU] via SUBCUTANEOUS
  Administered 2024-03-07: 3 [IU] via SUBCUTANEOUS
  Administered 2024-03-07 (×2): 2 [IU] via SUBCUTANEOUS
  Administered 2024-03-08: 3 [IU] via SUBCUTANEOUS
  Filled 2024-03-04 (×11): qty 1

## 2024-03-04 MED ORDER — TAMOXIFEN CITRATE 10 MG PO TABS
20.0000 mg | ORAL_TABLET | Freq: Every day | ORAL | Status: DC
  Administered 2024-03-05 – 2024-03-08 (×4): 20 mg via ORAL
  Filled 2024-03-04 (×5): qty 2

## 2024-03-04 MED ORDER — ALBUTEROL SULFATE (2.5 MG/3ML) 0.083% IN NEBU
2.5000 mg | INHALATION_SOLUTION | Freq: Once | RESPIRATORY_TRACT | Status: AC
  Administered 2024-03-04: 2.5 mg via RESPIRATORY_TRACT
  Filled 2024-03-04: qty 3

## 2024-03-04 MED ORDER — DOCUSATE SODIUM 100 MG PO CAPS
100.0000 mg | ORAL_CAPSULE | Freq: Every day | ORAL | Status: DC
  Administered 2024-03-05 – 2024-03-07 (×3): 100 mg via ORAL
  Filled 2024-03-04 (×3): qty 1

## 2024-03-04 MED ORDER — HEPARIN SODIUM (PORCINE) 5000 UNIT/ML IJ SOLN
5000.0000 [IU] | Freq: Three times a day (TID) | INTRAMUSCULAR | Status: DC
  Administered 2024-03-04 – 2024-03-08 (×12): 5000 [IU] via SUBCUTANEOUS
  Filled 2024-03-04 (×13): qty 1

## 2024-03-04 MED ORDER — SODIUM CHLORIDE 0.9 % IV SOLN
500.0000 mg | INTRAVENOUS | Status: DC
  Administered 2024-03-04 – 2024-03-05 (×2): 500 mg via INTRAVENOUS
  Filled 2024-03-04 (×2): qty 5

## 2024-03-04 MED ORDER — METHYLPREDNISOLONE SODIUM SUCC 125 MG IJ SOLR
60.0000 mg | Freq: Two times a day (BID) | INTRAMUSCULAR | Status: AC
  Administered 2024-03-04 – 2024-03-07 (×7): 60 mg via INTRAVENOUS
  Filled 2024-03-04 (×7): qty 2

## 2024-03-04 MED ORDER — SODIUM CHLORIDE 0.9 % IV SOLN: 250.0000 mL | INTRAVENOUS | Status: AC | PRN

## 2024-03-04 MED ORDER — MONTELUKAST SODIUM 10 MG PO TABS
10.0000 mg | ORAL_TABLET | Freq: Every day | ORAL | Status: DC
  Administered 2024-03-05 – 2024-03-07 (×3): 10 mg via ORAL
  Filled 2024-03-04 (×3): qty 1

## 2024-03-04 MED ORDER — IPRATROPIUM-ALBUTEROL 0.5-2.5 (3) MG/3ML IN SOLN
3.0000 mL | Freq: Four times a day (QID) | RESPIRATORY_TRACT | Status: AC
  Administered 2024-03-04 – 2024-03-06 (×7): 3 mL via RESPIRATORY_TRACT
  Filled 2024-03-04 (×7): qty 3

## 2024-03-04 MED ORDER — PANTOPRAZOLE SODIUM 40 MG PO TBEC
40.0000 mg | DELAYED_RELEASE_TABLET | Freq: Every day | ORAL | Status: DC
  Administered 2024-03-05 – 2024-03-08 (×4): 40 mg via ORAL
  Filled 2024-03-04 (×4): qty 1

## 2024-03-04 MED ORDER — SODIUM CHLORIDE 0.9 % IV SOLN
2.0000 g | Freq: Once | INTRAVENOUS | Status: AC
  Administered 2024-03-04: 2 g via INTRAVENOUS
  Filled 2024-03-04: qty 20

## 2024-03-04 MED ORDER — SODIUM CHLORIDE 0.9% FLUSH
3.0000 mL | Freq: Two times a day (BID) | INTRAVENOUS | Status: DC
  Administered 2024-03-04 – 2024-03-08 (×8): 3 mL via INTRAVENOUS

## 2024-03-04 MED ORDER — LEVOTHYROXINE SODIUM 50 MCG PO TABS
50.0000 ug | ORAL_TABLET | Freq: Every day | ORAL | Status: DC
  Administered 2024-03-05 – 2024-03-08 (×4): 50 ug via ORAL
  Filled 2024-03-04: qty 1
  Filled 2024-03-04 (×2): qty 2
  Filled 2024-03-04: qty 1

## 2024-03-04 MED ORDER — CHLORHEXIDINE GLUCONATE CLOTH 2 % EX PADS
6.0000 | MEDICATED_PAD | Freq: Every day | CUTANEOUS | Status: DC
  Administered 2024-03-04 – 2024-03-08 (×5): 6 via TOPICAL

## 2024-03-04 MED ORDER — SODIUM CHLORIDE 0.9% FLUSH: 3.0000 mL | INTRAVENOUS | Status: DC | PRN

## 2024-03-04 MED ORDER — ATORVASTATIN CALCIUM 40 MG PO TABS
40.0000 mg | ORAL_TABLET | Freq: Every day | ORAL | Status: DC
  Administered 2024-03-05 – 2024-03-08 (×4): 40 mg via ORAL
  Filled 2024-03-04 (×4): qty 1

## 2024-03-04 MED ORDER — ASPIRIN 81 MG PO TBEC
81.0000 mg | DELAYED_RELEASE_TABLET | Freq: Every day | ORAL | Status: DC
  Administered 2024-03-05 – 2024-03-08 (×4): 81 mg via ORAL
  Filled 2024-03-04 (×4): qty 1

## 2024-03-04 MED ORDER — ARFORMOTEROL TARTRATE 15 MCG/2ML IN NEBU
15.0000 ug | INHALATION_SOLUTION | Freq: Two times a day (BID) | RESPIRATORY_TRACT | Status: DC
  Administered 2024-03-04 – 2024-03-08 (×9): 15 ug via RESPIRATORY_TRACT
  Filled 2024-03-04 (×9): qty 2

## 2024-03-04 MED ORDER — IPRATROPIUM-ALBUTEROL 0.5-2.5 (3) MG/3ML IN SOLN
3.0000 mL | Freq: Once | RESPIRATORY_TRACT | Status: AC
  Administered 2024-03-04: 3 mL via RESPIRATORY_TRACT
  Filled 2024-03-04: qty 3

## 2024-03-04 MED ORDER — BUDESONIDE 0.5 MG/2ML IN SUSP
0.5000 mg | Freq: Two times a day (BID) | RESPIRATORY_TRACT | Status: DC
  Administered 2024-03-04 – 2024-03-08 (×9): 0.5 mg via RESPIRATORY_TRACT
  Filled 2024-03-04 (×9): qty 2

## 2024-03-04 MED ORDER — ONDANSETRON HCL 4 MG/2ML IJ SOLN: 4.0000 mg | Freq: Four times a day (QID) | INTRAMUSCULAR | Status: DC | PRN

## 2024-03-04 MED ORDER — BUDESONIDE 0.5 MG/2ML IN SUSP: 0.5000 mg | Freq: Two times a day (BID) | RESPIRATORY_TRACT | Status: DC
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
## 2024-03-04 NOTE — ED Notes (Signed)
 Date and time results received: 03/04/24 9:52 AM    Test: pH 7.16, PCO2 greater than 123 Critical Value: pH 7.16, PCO2 greater than 123  Name of Provider Notified: Zammit  Orders Received? Or Actions Taken?: see orders
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
## 2024-03-04 NOTE — ED Provider Notes (Signed)
 [place] EMERGENCY DEPARTMENT AT [facility] Provider Note   CSN: [id number] Arrival date & time: 03/04/24  9097     Patient presents with: Shortness of Breath   Sandra Cuevas is a 89 y.o. female.   Patient complains of shortness of breath.  Patient has a history of COPD.  Patient is a DNR DNI  The history is provided by the patient and medical records. No language interpreter was used.  Shortness of Breath Severity:  Severe Onset quality:  Gradual Timing:  Constant Progression:  Worsening Chronicity:  Recurrent Context: activity   Relieved by:  Nothing Worsened by:  Nothing Ineffective treatments:  None tried Associated symptoms: no abdominal pain, no chest pain, no cough, no headaches and no rash        Prior to Admission medications   Medication Sig Start Date End Date Taking? Authorizing Provider  acetaminophen  (TYLENOL ) 500 MG tablet Take 2 tablets (1,000 mg total) by mouth every 6 (six) hours as needed. 07/05/16   Armenta Canning, MD  albuterol  (PROVENTIL ) (2.5 MG/3ML) 0.083% nebulizer solution Take 3 mLs (2.5 mg total) by nebulization every 6 (six) hours as needed for wheezing or shortness of breath. 02/16/24   Shelah Lamar RAMAN, MD  amLODipine  (NORVASC ) 5 MG tablet Take 1 tablet (5 mg total) by mouth daily. 01/10/24   Dean Clarity, MD  aspirin  EC 81 MG tablet Take 1 tablet (81 mg total) by mouth daily. Swallow whole. 12/30/23   Segal, Jared E, DO  atorvastatin  (LIPITOR) 40 MG tablet TAKE 1 TABLET(40 MG) BY MOUTH DAILY AT 6 PM 12/24/23   Lelon Hamilton T, PA-C  azithromycin  (ZITHROMAX ) 250 MG tablet Take 2 on the first day, then take 1 daily until completely gone. 12/18/23   Shelah Lamar RAMAN, MD  calcium  carbonate (OSCAL) 1500 (600 Ca) MG TABS tablet Take 600 mg by mouth at bedtime.    [provider]  Cholecalciferol (VITAMIN D3 PO) Take 2,000 Units by mouth at bedtime.    [provider]  docusate sodium  (COLACE) 100 MG capsule Take 100 mg  by mouth at bedtime.    [provider]  famotidine  (PEPCID ) 20 MG tablet Take 1 tablet (20 mg total) by mouth 2 (two) times daily. 03/27/22   Zehr, Jessica D, PA-C  fluticasone  (FLONASE ) 50 MCG/ACT nasal spray SHAKE LIQUID AND USE 2 SPRAYS IN EACH NOSTRIL DAILY 12/07/18   Shelah Lamar RAMAN, MD  furosemide  (LASIX ) 20 MG tablet Take 20 mg by mouth daily as needed for fluid. 05/15/23   [provider]  gabapentin  (NEURONTIN ) 600 MG tablet Take 600 mg by mouth 3 (three) times daily. 08/05/19   [provider]  ibuprofen (ADVIL) 200 MG tablet Take 200 mg by mouth at bedtime.    [provider]  levothyroxine  (SYNTHROID , LEVOTHROID) 50 MCG tablet Take 50 mcg by mouth daily before breakfast.    [provider]  montelukast  (SINGULAIR ) 10 MG tablet Take 10 mg by mouth in the morning. 05/16/23   [provider]  Multiple Vitamin (MULTIVITAMIN WITH MINERALS) TABS tablet Take 1 tablet by mouth at bedtime.    [provider]  Multiple Vitamins-Minerals (PRESERVISION AREDS 2+MULTI VIT PO) Take 1 tablet by mouth in the morning.    [provider]  olopatadine (HM EYE ALLERGY ITCH/RED RELIEF) 0.1 % ophthalmic solution Place 1-2 drops into both eyes 2 (two) times daily as needed for allergies.    [provider]  omeprazole  (PRILOSEC) 20 MG  capsule TAKE 1 CAPSULE(20 MG) BY MOUTH DAILY 06/23/23   Zehr, Jessica D, PA-C  polyethylene glycol (MIRALAX  / GLYCOLAX ) 17 g packet Take 17 g by mouth daily as needed (constipation).    [provider]  potassium chloride  (KLOR-CON ) 10 MEQ tablet Take 10 mEq by mouth in the morning. 07/03/23   [provider]  tamoxifen  (NOLVADEX ) 20 MG tablet Take 1 tablet (20 mg total) by mouth daily. 02/25/24   Lamon Pleasant HERO, PA-C  zolpidem  (AMBIEN ) 5 MG tablet Take 5 mg by mouth at bedtime as needed for sleep.    [provider]    Allergies: Patient has no known allergies.    Review  of Systems  Constitutional:  Negative for appetite change and fatigue.  HENT:  Negative for congestion, ear discharge and sinus pressure.   Eyes:  Negative for discharge.  Respiratory:  Positive for shortness of breath. Negative for cough.   Cardiovascular:  Negative for chest pain.  Gastrointestinal:  Negative for abdominal pain and diarrhea.  Genitourinary:  Negative for frequency and hematuria.  Musculoskeletal:  Negative for back pain.  Skin:  Negative for rash.  Neurological:  Negative for seizures and headaches.  Psychiatric/Behavioral:  Negative for hallucinations.     Updated Vital Signs BP (!) 97/56   Pulse 86   Temp 97.6 F (36.4 C) (Oral)   Resp (!) 21   Ht 5' 6 (1.676 m)   Wt 92 kg   SpO2 99%   BMI 32.74 kg/m   Physical Exam Vitals and nursing note reviewed.  Constitutional:      Appearance: She is well-developed.  HENT:     Head: Normocephalic.     Right Ear: External ear normal.     Left Ear: External ear normal.     Nose: Nose normal.  Eyes:     General: No scleral icterus.    Conjunctiva/sclera: Conjunctivae normal.  Neck:     Thyroid : No thyromegaly.  Cardiovascular:     Rate and Rhythm: Regular rhythm. Tachycardia present.     Heart sounds: No murmur heard.    No friction rub. No gallop.  Pulmonary:     Breath sounds: No stridor. No wheezing or rales.     Comments: Tachypnea Chest:     Chest wall: No tenderness.  Abdominal:     General: There is no distension.     Tenderness: There is no abdominal tenderness. There is no rebound.  Musculoskeletal:        General: Normal range of motion.     Cervical back: Neck supple.  Lymphadenopathy:     Cervical: No cervical adenopathy.  Skin:    Findings: No erythema or rash.  Neurological:     Mental Status: She is alert and oriented to person, place, and time.     Motor: No abnormal muscle tone.     Coordination: Coordination normal.  Psychiatric:        Behavior: Behavior normal.     (all  labs ordered are listed, but only abnormal results are displayed) Labs Reviewed  CBC WITH DIFFERENTIAL/PLATELET - Abnormal; Notable for the following components:      Result Value   WBC 13.6 (*)    RBC 2.98 (*)    Hemoglobin 9.6 (*)    HCT 32.7 (*)    MCV 109.7 (*)    MCHC 29.4 (*)    Platelets 110 (*)    Lymphs Abs 4.2 (*)    Monocytes Absolute 1.3 (*)  Abs Immature Granulocytes 0.08 (*)    All other components within normal limits  COMPREHENSIVE METABOLIC PANEL WITH GFR - Abnormal; Notable for the following components:   CO2 40 (*)    Glucose, Bld 199 (*)    Anion gap 4 (*)    All other components within normal limits  BLOOD GAS, VENOUS - Abnormal; Notable for the following components:   pH, Ven 7.16 (*)    pCO2, Ven >123 (*)    pO2, Ven 66 (*)    Bicarbonate 44.1 (*)    Acid-Base Excess 10.6 (*)    All other components within normal limits  PRO BRAIN NATRIURETIC PEPTIDE - Abnormal; Notable for the following components:   Pro Brain Natriuretic Peptide 702.0 (*)    All other components within normal limits  TROPONIN T, HIGH SENSITIVITY - Abnormal; Notable for the following components:   Troponin T High Sensitivity 37 (*)    All other components within normal limits  CULTURE, BLOOD (ROUTINE X 2)  CULTURE, BLOOD (ROUTINE X 2)  SARS CORONAVIRUS 2 BY RT PCR  LACTIC ACID, PLASMA  LACTIC ACID, PLASMA  TROPONIN T, HIGH SENSITIVITY    EKG: EKG Interpretation Date/Time:  Thursday March 04 2024 09:15:28 EST Ventricular Rate:  101 PR Interval:  142 QRS Duration:  156 QT Interval:  386 QTC Calculation: 501 R Axis:   91  Text Interpretation: Sinus tachycardia Probable left atrial enlargement Right bundle branch block Confirmed by Suzette Pac [phone]) on 03/04/2024 9:33:17 AM  Radiology: [facility] Chest Port 1 View Result Date: 03/04/2024 CLINICAL DATA:  Short of breath.  Decreased oxygen  saturation. EXAM: PORTABLE CHEST 1 VIEW COMPARISON:  02/27/2024 and older studies.  FINDINGS: Cardiac silhouette normal in size. Dense aortic atherosclerotic calcification. No gross mediastinal or hilar masses. Vascular congestion, interstitial thickening and and patchy areas of hazy airspace opacity noted in the lungs, increased when compared to the most recent prior exam. More confluent opacity at the bases is consistent with bilateral effusions. No pneumothorax. IMPRESSION: 1. Lung findings consistent with pulmonary edema associated with bilateral pleural effusions, developing/increasing from the exam dated 02/27/2024. Electronically Signed   By: Alm Parkins M.D.   On: 03/04/2024 09:47     Procedures   Medications Ordered in the ED  methylPREDNISolone  sodium succinate (SOLU-MEDROL ) 125 mg/2 mL injection 125 mg (125 mg Intravenous Given 03/04/24 0935)  ipratropium-albuterol  (DUONEB) 0.5-2.5 (3) MG/3ML nebulizer solution 3 mL (3 mLs Nebulization Given 03/04/24 0925)  albuterol  (PROVENTIL ) (2.5 MG/3ML) 0.083% nebulizer solution 2.5 mg (2.5 mg Nebulization Given 03/04/24 0925)  magnesium  sulfate IVPB 2 g 50 mL (0 g Intravenous Stopped 03/04/24 1040)  furosemide  (LASIX ) injection 40 mg (40 mg Intravenous Given 03/04/24 0942)  cefTRIAXone  (ROCEPHIN ) 2 g in sodium chloride  0.9 % 100 mL IVPB (0 g Intravenous Stopped 03/04/24 1046)   CRITICAL CARE Performed by: Pac Suzette Total critical care time: 45 minutes Critical care time was exclusive of separately billable procedures and treating other patients. Critical care was necessary to treat or prevent imminent or life-threatening deterioration. Critical care was time spent personally by me on the following activities: development of treatment plan with patient and/or surrogate as well as nursing, discussions with consultants, evaluation of patient's response to treatment, examination of patient, obtaining history from patient or surrogate, ordering and performing treatments and interventions, ordering and review of laboratory  studies, ordering and review of radiographic studies, pulse oximetry and re-evaluation of patient's condition.  Medical Decision Making Amount and/or Complexity of Data Reviewed Labs: ordered. Radiology: ordered. ECG/medicine tests: ordered.  Risk Prescription drug management. Decision regarding hospitalization.   Patient with hypoxia and congestive heart failure.  he is put on BiPAP and will be admitted to medicine    Final diagnoses:  Systolic congestive heart failure, unspecified HF chronicity [facility])    ED Discharge Orders     None          Suzette Pac, MD 03/05/24 [phone]

## 2024-03-04 NOTE — Plan of Care (Signed)
  Problem: Education: Goal: Knowledge of General Education information will improve Description: Including pain rating scale, medication(s)/side effects and non-pharmacologic comfort measures Outcome: Progressing   Problem: Health Behavior/Discharge Planning: Goal: Ability to manage health-related needs will improve Outcome: Progressing   Problem: Clinical Measurements: Goal: Ability to maintain clinical measurements within normal limits will improve Outcome: Progressing Goal: Will remain free from infection Outcome: Progressing Goal: Diagnostic test results will improve Outcome: Progressing Goal: Respiratory complications will improve Outcome: Not Progressing Goal: Cardiovascular complication will be avoided Outcome: Progressing   Problem: Activity: Goal: Risk for activity intolerance will decrease Outcome: Not Progressing   Problem: Nutrition: Goal: Adequate nutrition will be maintained Outcome: Not Progressing   Problem: Coping: Goal: Level of anxiety will decrease Outcome: Progressing   Problem: Elimination: Goal: Will not experience complications related to bowel motility Outcome: Progressing Goal: Will not experience complications related to urinary retention Outcome: Progressing   Problem: Pain Managment: Goal: General experience of comfort will improve and/or be controlled Outcome: Progressing   Problem: Safety: Goal: Ability to remain free from injury will improve Outcome: Progressing   Problem: Skin Integrity: Goal: Risk for impaired skin integrity will decrease Outcome: Progressing   Problem: Education: Goal: Ability to describe self-care measures that may prevent or decrease complications (Diabetes Survival Skills Education) will improve Outcome: Progressing Goal: Individualized Educational Video(s) Outcome: Progressing   Problem: Coping: Goal: Ability to adjust to condition or change in health will improve Outcome: Progressing   Problem: Fluid  Volume: Goal: Ability to maintain a balanced intake and output will improve Outcome: Progressing   Problem: Health Behavior/Discharge Planning: Goal: Ability to identify and utilize available resources and services will improve Outcome: Progressing Goal: Ability to manage health-related needs will improve Outcome: Progressing   Problem: Metabolic: Goal: Ability to maintain appropriate glucose levels will improve Outcome: Progressing   Problem: Nutritional: Goal: Maintenance of adequate nutrition will improve Outcome: Not Progressing Goal: Progress toward achieving an optimal weight will improve Outcome: Not Progressing   Problem: Skin Integrity: Goal: Risk for impaired skin integrity will decrease Outcome: Progressing   Problem: Tissue Perfusion: Goal: Adequacy of tissue perfusion will improve Outcome: Progressing

## 2024-03-04 NOTE — ED Notes (Signed)
 EDP at bedside during triage. RT called for assessment.
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
## 2024-03-04 NOTE — Progress Notes (Signed)
Patient transported from ED to ICU without any complications. 

## 2024-03-05 ENCOUNTER — Other Ambulatory Visit (HOSPITAL_COMMUNITY): Payer: Self-pay | Admitting: *Deleted

## 2024-03-05 ENCOUNTER — Inpatient Hospital Stay (HOSPITAL_COMMUNITY)

## 2024-03-05 DIAGNOSIS — I5033 Acute on chronic diastolic (congestive) heart failure: Secondary | ICD-10-CM

## 2024-03-05 DIAGNOSIS — I5031 Acute diastolic (congestive) heart failure: Secondary | ICD-10-CM

## 2024-03-05 DIAGNOSIS — J9622 Acute and chronic respiratory failure with hypercapnia: Secondary | ICD-10-CM

## 2024-03-05 DIAGNOSIS — J441 Chronic obstructive pulmonary disease with (acute) exacerbation: Secondary | ICD-10-CM | POA: Diagnosis not present

## 2024-03-05 DIAGNOSIS — J9621 Acute and chronic respiratory failure with hypoxia: Secondary | ICD-10-CM

## 2024-03-05 DIAGNOSIS — E669 Obesity, unspecified: Secondary | ICD-10-CM

## 2024-03-05 LAB — CBC
HCT: 26.2 % — ABNORMAL LOW (ref 36.0–46.0)
Hemoglobin: 7.9 g/dL — ABNORMAL LOW (ref 12.0–15.0)
MCH: 32.4 pg (ref 26.0–34.0)
MCHC: 30.2 g/dL (ref 30.0–36.0)
MCV: 107.4 fL — ABNORMAL HIGH (ref 80.0–100.0)
Platelets: 120 K/uL — ABNORMAL LOW (ref 150–400)
RBC: 2.44 MIL/uL — ABNORMAL LOW (ref 3.87–5.11)
RDW: 14.5 % (ref 11.5–15.5)
WBC: 4.9 K/uL (ref 4.0–10.5)
nRBC: 0 % (ref 0.0–0.2)

## 2024-03-05 LAB — COMPREHENSIVE METABOLIC PANEL WITH GFR
ALT: 19 U/L (ref 0–44)
AST: 26 U/L (ref 15–41)
Albumin: 3.2 g/dL — ABNORMAL LOW (ref 3.5–5.0)
Alkaline Phosphatase: 77 U/L (ref 38–126)
Anion gap: 4 — ABNORMAL LOW (ref 5–15)
BUN: 26 mg/dL — ABNORMAL HIGH (ref 8–23)
CO2: 41 mmol/L — ABNORMAL HIGH (ref 22–32)
Calcium: 9 mg/dL (ref 8.9–10.3)
Chloride: 99 mmol/L (ref 98–111)
Creatinine, Ser: 0.87 mg/dL (ref 0.44–1.00)
GFR, Estimated: >60 mL/min (ref >60)
Glucose, Bld: 216 mg/dL — ABNORMAL HIGH (ref 70–99)
Potassium: 4.4 mmol/L (ref 3.5–5.1)
Sodium: 144 mmol/L (ref 135–145)
Total Bilirubin: 0.4 mg/dL (ref 0.0–1.2)
Total Protein: 5.7 g/dL — ABNORMAL LOW (ref 6.5–8.1)

## 2024-03-05 LAB — PHOSPHORUS: Phosphorus: 3.2 mg/dL (ref 2.5–4.6)

## 2024-03-05 LAB — GLUCOSE, CAPILLARY
Glucose-Capillary: 200 mg/dL — ABNORMAL HIGH (ref 70–99)
Glucose-Capillary: 191 mg/dL — ABNORMAL HIGH (ref 70–99)
Glucose-Capillary: 227 mg/dL — ABNORMAL HIGH (ref 70–99)
Glucose-Capillary: 150 mg/dL — ABNORMAL HIGH (ref 70–99)

## 2024-03-05 LAB — MRSA NEXT GEN BY PCR, NASAL: MRSA by PCR Next Gen: NOT DETECTED

## 2024-03-05 LAB — MAGNESIUM: Magnesium: 2.2 mg/dL (ref 1.7–2.4)

## 2024-03-05 MED ORDER — FUROSEMIDE 10 MG/ML IJ SOLN
40.0000 mg | Freq: Once | INTRAMUSCULAR | Status: AC
  Administered 2024-03-05: 40 mg via INTRAVENOUS
  Filled 2024-03-05: qty 4

## 2024-03-05 MED ORDER — REVEFENACIN 175 MCG/3ML IN SOLN
175.0000 ug | Freq: Every day | RESPIRATORY_TRACT | Status: DC
  Administered 2024-03-06 – 2024-03-08 (×3): 175 ug via RESPIRATORY_TRACT
  Filled 2024-03-05 (×3): qty 3

## 2024-03-05 MED ORDER — ALBUTEROL SULFATE (2.5 MG/3ML) 0.083% IN NEBU
2.5000 mg | INHALATION_SOLUTION | Freq: Four times a day (QID) | RESPIRATORY_TRACT | Status: DC
  Administered 2024-03-06 (×3): 2.5 mg via RESPIRATORY_TRACT
  Filled 2024-03-05 (×3): qty 3

## 2024-03-05 MED ORDER — PERFLUTREN LIPID MICROSPHERE
1.0000 mL | INTRAVENOUS | Status: AC | PRN
  Administered 2024-03-05: 3 mL via INTRAVENOUS

## 2024-03-05 NOTE — Plan of Care (Signed)
  Problem: Education: Goal: Knowledge of General Education information will improve Description: Including pain rating scale, medication(s)/side effects and non-pharmacologic comfort measures Outcome: Progressing   Problem: Health Behavior/Discharge Planning: Goal: Ability to manage health-related needs will improve Outcome: Progressing   Problem: Clinical Measurements: Goal: Ability to maintain clinical measurements within normal limits will improve Outcome: Progressing Goal: Will remain free from infection Outcome: Progressing Goal: Diagnostic test results will improve Outcome: Progressing Goal: Respiratory complications will improve Outcome: Progressing Goal: Cardiovascular complication will be avoided Outcome: Progressing   Problem: Activity: Goal: Risk for activity intolerance will decrease Outcome: Not Progressing   Problem: Nutrition: Goal: Adequate nutrition will be maintained Outcome: Progressing   Problem: Coping: Goal: Level of anxiety will decrease Outcome: Progressing   Problem: Elimination: Goal: Will not experience complications related to bowel motility Outcome: Progressing Goal: Will not experience complications related to urinary retention Outcome: Progressing   Problem: Pain Managment: Goal: General experience of comfort will improve and/or be controlled Outcome: Progressing   Problem: Safety: Goal: Ability to remain free from injury will improve Outcome: Progressing   Problem: Skin Integrity: Goal: Risk for impaired skin integrity will decrease Outcome: Progressing   Problem: Education: Goal: Ability to describe self-care measures that may prevent or decrease complications (Diabetes Survival Skills Education) will improve Outcome: Progressing Goal: Individualized Educational Video(s) Outcome: Progressing   Problem: Coping: Goal: Ability to adjust to condition or change in health will improve Outcome: Progressing   Problem: Fluid  Volume: Goal: Ability to maintain a balanced intake and output will improve Outcome: Progressing   Problem: Health Behavior/Discharge Planning: Goal: Ability to identify and utilize available resources and services will improve Outcome: Progressing Goal: Ability to manage health-related needs will improve Outcome: Progressing   Problem: Metabolic: Goal: Ability to maintain appropriate glucose levels will improve Outcome: Progressing   Problem: Nutritional: Goal: Maintenance of adequate nutrition will improve Outcome: Progressing Goal: Progress toward achieving an optimal weight will improve Outcome: Progressing   Problem: Skin Integrity: Goal: Risk for impaired skin integrity will decrease Outcome: Progressing   Problem: Tissue Perfusion: Goal: Adequacy of tissue perfusion will improve Outcome: Progressing

## 2024-03-05 NOTE — Progress Notes (Signed)
 Running blood pressures on the right arm every once in a while per Dr. Evonnie. Patient has stenosis making her blood pressures read falsely low when taken on the left arm. Patient's mentation has not changed since this morning. Patient been sitting in the bed,talking with family.

## 2024-03-05 NOTE — Progress Notes (Addendum)
 Patient wanted to wait till 02;00 to place on BiPAP. Doubt she will then. Patient tried BiPAP but pulled off and wanted rx. Told her we would wait till 02:00.

## 2024-03-05 NOTE — Progress Notes (Signed)
*  PRELIMINARY RESULTS* Echocardiogram 2D Echocardiogram has been performed with [facility].  Sandra Cuevas 03/05/2024, 10:36 AM

## 2024-03-05 NOTE — Progress Notes (Signed)
 Patient removed from Bipap around 0740 this morning. She was placed on her 3lpm [place]. She was comfortable and resting at that time. Patient has remained on [place] throughout the day. I will change Bipap order to at bedtime to help keep CO2 level increase.

## 2024-03-05 NOTE — TOC Initial Note (Signed)
 Transition of Care [facility]) - Initial/Assessment Note    Patient Details  Name: Sandra Cuevas MRN: [id number] Date of Birth: 1928/09/30  Transition of [facility]) CM/SW Contact:    Hoy DELENA Bigness, LCSW Phone Number: 03/05/2024, 1:13 PM  Clinical Narrative:                 Pt assessed due to high risk for readmission score. Pt is from home alone. Son and daughter live across the street from her and often stay the night with her. Pt uses 3L of O2 at baseline provided through [employer]. Pt has RW, shower chair, and grab bars. Family assist with transportation to appointments. Pt/family share that if pt needs SNF at discharge their preference would be [facility] at [facility]. ICM will continue to follow.   Expected Discharge Plan: Skilled Nursing Facility Barriers to Discharge: Continued Medical Work up   Patient Goals and CMS Choice Patient states their goals for this hospitalization and ongoing recovery are:: To feel better          Expected Discharge Plan and Services In-house Referral: Clinical Social Work Discharge Planning Services: NA Post Acute Care Choice: Skilled Nursing Facility Living arrangements for the past 2 months: Single Family Home                 DME Arranged: N/A DME Agency: NA                  Prior Living Arrangements/Services Living arrangements for the past 2 months: Single Family Home Lives with:: Self Patient language and need for interpreter reviewed:: Yes Do you feel safe going back to the place where you live?: Yes      Need for Family Participation in Patient Care: Yes (Comment) Care giver support system in place?: Yes (comment) Current home services: DME (Home O2 w/ [employer], RW, shower chair) Criminal Activity/Legal Involvement Pertinent to Current Situation/Hospitalization: No - Comment as needed  Activities of Daily Living   ADL Screening (condition at time of admission) Independently performs ADLs?: Yes (appropriate for developmental age) Is the  patient deaf or have difficulty hearing?: Yes Does the patient have difficulty seeing, even when wearing glasses/contacts?: Yes Does the patient have difficulty concentrating, remembering, or making decisions?: No  Permission Sought/Granted Permission sought to share information with : Facility Medical Sales Representative, Family Supports Permission granted to share information with : Yes, Verbal Permission Granted  Share Information with NAME: Sherlon Corrigan (Daughter)  [phone]  Permission granted to share info w AGENCY: SNF's, HHA's, DME        Emotional Assessment Appearance:: Appears younger than stated age Attitude/Demeanor/Rapport: Engaged Affect (typically observed): Accepting Orientation: : Oriented to Self, Oriented to Place, Oriented to  Time, Oriented to Situation Alcohol  / Substance Use: Not Applicable Psych Involvement: No (comment)  Admission diagnosis:  Acute on chronic respiratory failure with hypoxia and hypercapnia (HCC) [J96.21, J96.22] Systolic congestive heart failure, unspecified HF chronicity (HCC) [I50.20] Patient Active Problem List   Diagnosis Date Noted   Acute on chronic respiratory failure with hypoxia and hypercapnia (HCC) 03/04/2024   COPD with acute exacerbation (HCC) 03/04/2024   Acute on chronic heart failure with preserved ejection fraction (HFpEF) (HCC) 03/04/2024   Obesity (BMI 30-39.9) 03/04/2024   Osteoporosis 08/13/2023   S/P mastectomy, right 07/23/2023   Breast cancer of lower-inner quadrant of right female breast (HCC) 07/15/2023   Chronic respiratory failure with hypoxia (HCC) 04/14/2023   Belching 03/27/2022   Upper airway cough syndrome 10/03/2021   Carotid  artery disease 07/03/2021   Aortic stenosis 07/03/2021   Mixed stress and urge urinary incontinence 07/13/2020   Rectocele 07/13/2020   Constipation 07/13/2020   S/P hysterectomy with oophorectomy 07/13/2020   Pelvic pressure in female 07/13/2020   Bilateral sciatica 07/13/2020    Encounter for screening fecal occult blood testing 07/13/2020   Dyspnea 08/10/2019   CKD (chronic kidney disease) stage 3, GFR 30-59 ml/min (HCC) 08/10/2019   Right rib fracture 03/21/2019   Acute respiratory failure (HCC) 03/20/2019   Fever 10/08/2018   Chronic rhinitis 11/17/2017   Pulmonary nodules 03/10/2017   GERD (gastroesophageal reflux disease) 10/14/2016   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   Chest pain 07/14/2016   Diabetes mellitus, type II (HCC) 07/14/2016   CKD (chronic kidney disease), stage II 07/14/2016   Cellulitis 07/12/2016   Cellulitis of right leg 07/12/2016   (HFpEF) heart failure with preserved ejection fraction (HCC) 04/30/2016   Hoarse voice quality 09/05/2015   Chronic obstructive airway disease with asthma (HCC) 04/20/2013   Snoring 04/20/2013   Premature ventricular contractions 03/23/2013   Change in stool 01/06/2012   Shortness of breath 09/26/2011   Carpal tunnel syndrome 11/06/2010   Hyperlipidemia LDL goal <70    Atypical chest pain    Hypothyroidism    Diabetes mellitus (HCC)    EXTERNAL HEMORRHOIDS WITHOUT MENTION COMP 02/12/2010   RECTAL BLEEDING 02/12/2010   CERVICAL CANCER 10/18/2009   Dyslipidemia 10/18/2009   Essential hypertension 10/18/2009   Cough 10/18/2009   PCP:  Marvine Rush, MD Pharmacy:   [facility] [phone] - [place], [place] - [address] AT [facility] [address] [place] [address] Phone: [phone] Fax: [phone]  [facility] [address] [place] - [address] [place] [address] [place] [address] [place] [address] Phone: [phone] Fax: [phone]     Social Drivers of Health (SDOH) Social History: SDOH Screenings   Food Insecurity: No Food Insecurity (03/04/2024)  Housing: Low Risk  (03/04/2024)  Transportation Needs: No Transportation Needs (03/04/2024)  Utilities: Not At Risk (03/04/2024)  Alcohol  Screen: Low Risk  (07/13/2020)  Depression (PHQ2-9):  Medium Risk (02/18/2024)  Financial Resource Strain: Low Risk  (07/13/2020)  Physical Activity: Inactive (07/13/2020)  Social Connections: Unknown (03/04/2024)  Stress: No Stress Concern Present (07/13/2020)  Tobacco Use: Low Risk  (02/04/2024)   SDOH Interventions:     Readmission Risk Interventions    03/05/2024    1:11 PM 07/23/2023    5:21 PM 04/21/2023    1:15 PM  Readmission Risk Prevention Plan  Post Dischage Appt  Complete   Medication Screening  Complete   Transportation Screening Complete Complete Complete  PCP or Specialist Appt within 3-5 Days Complete    HRI or Home Care Consult Complete  Complete  Social Work Consult for Recovery Care Planning/Counseling Complete  Complete  Palliative Care Screening Not Applicable  Not Applicable  Medication Review Oceanographer) Complete  Complete
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
## 2024-03-06 DIAGNOSIS — J441 Chronic obstructive pulmonary disease with (acute) exacerbation: Secondary | ICD-10-CM | POA: Diagnosis not present

## 2024-03-06 DIAGNOSIS — E669 Obesity, unspecified: Secondary | ICD-10-CM | POA: Diagnosis not present

## 2024-03-06 DIAGNOSIS — I5033 Acute on chronic diastolic (congestive) heart failure: Secondary | ICD-10-CM | POA: Diagnosis not present

## 2024-03-06 DIAGNOSIS — J9621 Acute and chronic respiratory failure with hypoxia: Secondary | ICD-10-CM | POA: Diagnosis not present

## 2024-03-06 LAB — ECHOCARDIOGRAM COMPLETE
AR max vel: 1.9 cm2
AV Area VTI: 1.88 cm2
AV Area mean vel: 1.92 cm2
AV Mean grad: 11.5 mmHg
AV Peak grad: 21.2 mmHg
Ao pk vel: 2.3 m/s
Area-P 1/2: 3.72 cm2
Calc EF: 59.5 %
Height: 64 in
MV VTI: 1.93 cm2
S' Lateral: 3.4 cm
Single Plane A2C EF: 59.8 %
Single Plane A4C EF: 57.4 %
Weight: 3396.85 [oz_av]

## 2024-03-06 LAB — BASIC METABOLIC PANEL WITH GFR
Anion gap: 6 (ref 5–15)
BUN: 32 mg/dL — ABNORMAL HIGH (ref 8–23)
CO2: 40 mmol/L — ABNORMAL HIGH (ref 22–32)
Calcium: 9.1 mg/dL (ref 8.9–10.3)
Chloride: 96 mmol/L — ABNORMAL LOW (ref 98–111)
Creatinine, Ser: 0.95 mg/dL (ref 0.44–1.00)
GFR, Estimated: 55 mL/min — ABNORMAL LOW (ref >60)
Glucose, Bld: 251 mg/dL — ABNORMAL HIGH (ref 70–99)
Potassium: 4.1 mmol/L (ref 3.5–5.1)
Sodium: 142 mmol/L (ref 135–145)

## 2024-03-06 LAB — GLUCOSE, CAPILLARY
Glucose-Capillary: 252 mg/dL — ABNORMAL HIGH (ref 70–99)
Glucose-Capillary: 204 mg/dL — ABNORMAL HIGH (ref 70–99)
Glucose-Capillary: 276 mg/dL — ABNORMAL HIGH (ref 70–99)
Glucose-Capillary: 237 mg/dL — ABNORMAL HIGH (ref 70–99)

## 2024-03-06 LAB — T4, FREE: Free T4: 0.73 ng/dL (ref 0.61–1.12)

## 2024-03-06 LAB — HEMOGLOBIN A1C
Hgb A1c MFr Bld: 6.3 % — ABNORMAL HIGH (ref 4.8–5.6)
Mean Plasma Glucose: 134 mg/dL

## 2024-03-06 LAB — VITAMIN B12: Vitamin B-12: 919 pg/mL — ABNORMAL HIGH (ref 180–914)

## 2024-03-06 LAB — FOLATE: Folate: >20 ng/mL (ref >5.9)

## 2024-03-06 LAB — TSH: TSH: 1.3 u[IU]/mL (ref 0.350–4.500)

## 2024-03-06 LAB — AMMONIA: Ammonia: 28 umol/L (ref 9–35)

## 2024-03-06 LAB — MAGNESIUM: Magnesium: 2 mg/dL (ref 1.7–2.4)

## 2024-03-06 MED ORDER — AZITHROMYCIN 250 MG PO TABS
500.0000 mg | ORAL_TABLET | Freq: Every day | ORAL | Status: AC
  Administered 2024-03-06 – 2024-03-08 (×3): 500 mg via ORAL
  Filled 2024-03-06 (×3): qty 2

## 2024-03-06 MED ORDER — FUROSEMIDE 10 MG/ML IJ SOLN
40.0000 mg | Freq: Once | INTRAMUSCULAR | Status: AC
  Administered 2024-03-06: 40 mg via INTRAVENOUS
  Filled 2024-03-06: qty 4

## 2024-03-06 NOTE — Progress Notes (Signed)
 Patient wore BIPAP from 2 to 6 approximately. BiPAP placed on by nurse (Thanks) did well. Back on 2 liters oxygen  saturation 93.

## 2024-03-06 NOTE — Progress Notes (Signed)
 Patient wanted to try without BiPAP tonight to see how she does. BiPAP on standby , nursing notified.

## 2024-03-06 NOTE — Plan of Care (Signed)

## 2024-03-06 NOTE — Progress Notes (Signed)
   03/06/24 0800  ReDS Vest / Clip  Station Marker A  Ruler Value 48  ReDS Value Range (!) > 40  ReDS Actual Value 57
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
## 2024-03-07 ENCOUNTER — Inpatient Hospital Stay (HOSPITAL_COMMUNITY)

## 2024-03-07 DIAGNOSIS — R7989 Other specified abnormal findings of blood chemistry: Secondary | ICD-10-CM | POA: Diagnosis not present

## 2024-03-07 DIAGNOSIS — J441 Chronic obstructive pulmonary disease with (acute) exacerbation: Secondary | ICD-10-CM | POA: Diagnosis not present

## 2024-03-07 DIAGNOSIS — I5033 Acute on chronic diastolic (congestive) heart failure: Secondary | ICD-10-CM | POA: Diagnosis not present

## 2024-03-07 DIAGNOSIS — I471 Supraventricular tachycardia, unspecified: Secondary | ICD-10-CM | POA: Insufficient documentation

## 2024-03-07 DIAGNOSIS — J9621 Acute and chronic respiratory failure with hypoxia: Secondary | ICD-10-CM | POA: Diagnosis not present

## 2024-03-07 DIAGNOSIS — R0600 Dyspnea, unspecified: Secondary | ICD-10-CM | POA: Diagnosis not present

## 2024-03-07 LAB — BASIC METABOLIC PANEL WITH GFR
Anion gap: 8 (ref 5–15)
BUN: 31 mg/dL — ABNORMAL HIGH (ref 8–23)
CO2: 38 mmol/L — ABNORMAL HIGH (ref 22–32)
Calcium: 9 mg/dL (ref 8.9–10.3)
Chloride: 95 mmol/L — ABNORMAL LOW (ref 98–111)
Creatinine, Ser: 0.9 mg/dL (ref 0.44–1.00)
GFR, Estimated: 59 mL/min — ABNORMAL LOW (ref >60)
Glucose, Bld: 234 mg/dL — ABNORMAL HIGH (ref 70–99)
Potassium: 4 mmol/L (ref 3.5–5.1)
Sodium: 140 mmol/L (ref 135–145)

## 2024-03-07 LAB — GLUCOSE, CAPILLARY
Glucose-Capillary: 234 mg/dL — ABNORMAL HIGH (ref 70–99)
Glucose-Capillary: 195 mg/dL — ABNORMAL HIGH (ref 70–99)
Glucose-Capillary: 197 mg/dL — ABNORMAL HIGH (ref 70–99)
Glucose-Capillary: 239 mg/dL — ABNORMAL HIGH (ref 70–99)
Glucose-Capillary: 225 mg/dL — ABNORMAL HIGH (ref 70–99)

## 2024-03-07 LAB — CBC
HCT: 28.2 % — ABNORMAL LOW (ref 36.0–46.0)
Hemoglobin: 9 g/dL — ABNORMAL LOW (ref 12.0–15.0)
MCH: 33 pg (ref 26.0–34.0)
MCHC: 31.9 g/dL (ref 30.0–36.0)
MCV: 103.3 fL — ABNORMAL HIGH (ref 80.0–100.0)
Platelets: 143 K/uL — ABNORMAL LOW (ref 150–400)
RBC: 2.73 MIL/uL — ABNORMAL LOW (ref 3.87–5.11)
RDW: 15.3 % (ref 11.5–15.5)
WBC: 8.1 K/uL (ref 4.0–10.5)
nRBC: 0.2 % (ref 0.0–0.2)

## 2024-03-07 LAB — PRO BRAIN NATRIURETIC PEPTIDE: Pro Brain Natriuretic Peptide: 4121 pg/mL — ABNORMAL HIGH (ref <300.0)

## 2024-03-07 LAB — TROPONIN T, HIGH SENSITIVITY
Troponin T High Sensitivity: 71 ng/L — ABNORMAL HIGH (ref 0–19)
Troponin T High Sensitivity: 82 ng/L — ABNORMAL HIGH (ref 0–19)

## 2024-03-07 LAB — D-DIMER, QUANTITATIVE: D-Dimer, Quant: 2.11 ug{FEU}/mL — ABNORMAL HIGH (ref 0.00–0.50)

## 2024-03-07 LAB — MAGNESIUM: Magnesium: 2.1 mg/dL (ref 1.7–2.4)

## 2024-03-07 MED ORDER — IOHEXOL 350 MG/ML SOLN: 75.0000 mL | Freq: Once | INTRAVENOUS | Status: DC | PRN

## 2024-03-07 MED ORDER — FUROSEMIDE 40 MG PO TABS
40.0000 mg | ORAL_TABLET | Freq: Every day | ORAL | Status: DC
  Administered 2024-03-07 – 2024-03-08 (×2): 40 mg via ORAL
  Filled 2024-03-07 (×2): qty 1

## 2024-03-07 MED ORDER — PREDNISONE 20 MG PO TABS
60.0000 mg | ORAL_TABLET | Freq: Every day | ORAL | Status: DC
  Administered 2024-03-08: 60 mg via ORAL
  Filled 2024-03-07: qty 3

## 2024-03-07 MED ORDER — TECHNETIUM TO 99M ALBUMIN AGGREGATED
4.4000 | Freq: Once | INTRAVENOUS | Status: AC | PRN
  Administered 2024-03-07: 4.4 via INTRAVENOUS

## 2024-03-07 MED ORDER — METOPROLOL TARTRATE 25 MG PO TABS
12.5000 mg | ORAL_TABLET | Freq: Two times a day (BID) | ORAL | Status: DC
  Administered 2024-03-07 – 2024-03-08 (×3): 12.5 mg via ORAL
  Filled 2024-03-07 (×3): qty 1

## 2024-03-07 MED ORDER — ALBUTEROL SULFATE (2.5 MG/3ML) 0.083% IN NEBU
2.5000 mg | INHALATION_SOLUTION | Freq: Three times a day (TID) | RESPIRATORY_TRACT | Status: DC
  Administered 2024-03-07 – 2024-03-08 (×4): 2.5 mg via RESPIRATORY_TRACT
  Filled 2024-03-07 (×4): qty 3
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
## 2024-03-07 NOTE — Plan of Care (Signed)
  Problem: Acute Rehab PT Goals(only PT should resolve) Goal: Pt Will Go Supine/Side To Sit Outcome: Progressing Flowsheets (Taken 03/07/2024 0826) Pt will go Supine/Side to Sit: with minimal assist Goal: Patient Will Transfer Sit To/From Stand Outcome: Progressing Flowsheets (Taken 03/07/2024 0826) Patient will transfer sit to/from stand: with minimal assist Goal: Pt Will Transfer Bed To Chair/Chair To Bed Outcome: Progressing Flowsheets (Taken 03/07/2024 0826) Pt will Transfer Bed to Chair/Chair to Bed: with min assist Goal: Pt Will Ambulate Outcome: Progressing Flowsheets (Taken 03/07/2024 0826) Pt will Ambulate:  15 feet  with rolling walker  with minimal assist

## 2024-03-07 NOTE — TOC Progression Note (Addendum)
 Transition of Care [facility]) - Progression Note    Patient Details  Name: Sandra Cuevas MRN: [id number] Date of Birth: 1929/03/27  [facility] CM/SW Contact  Sharlyne Stabs, RN Phone Number: 03/07/2024, 11:37 AM  Clinical Narrative:   PT is recommending SNF. Per notes family wanted PNC, FL2 completed and sent out. IPCM will follow up with [facility] on Monday to see if they have beds. CM left Tammy at [facility] a voice mail to start Auth and patient would be medically ready tomorrow. IPCM following.   Expected Discharge Plan: Skilled Nursing Facility Barriers to Discharge: Continued Medical Work up      Expected Discharge Plan and Services In-house Referral: Clinical Social Work Discharge Planning Services: NA Post Acute Care Choice: Skilled Nursing Facility Living arrangements for the past 2 months: Single Family Home                 DME Arranged: N/A DME Agency: NA      Social Drivers of Health (SDOH) Interventions SDOH Screenings   Food Insecurity: No Food Insecurity (03/04/2024)  Housing: Low Risk  (03/04/2024)  Transportation Needs: No Transportation Needs (03/04/2024)  Utilities: Not At Risk (03/04/2024)  Alcohol  Screen: Low Risk  (07/13/2020)  Depression (PHQ2-9): Medium Risk (02/18/2024)  Financial Resource Strain: Low Risk  (07/13/2020)  Physical Activity: Inactive (07/13/2020)  Social Connections: Unknown (03/04/2024)  Stress: No Stress Concern Present (07/13/2020)  Tobacco Use: Low Risk  (02/04/2024)    Readmission Risk Interventions    03/05/2024    1:11 PM 07/23/2023    5:21 PM 04/21/2023    1:15 PM  Readmission Risk Prevention Plan  Post Dischage Appt  Complete   Medication Screening  Complete   Transportation Screening Complete Complete Complete  PCP or Specialist Appt within 3-5 Days Complete    HRI or Home Care Consult Complete  Complete  Social Work Consult for Recovery Care Planning/Counseling Complete  Complete  Palliative Care Screening Not  Applicable  Not Applicable  Medication Review Oceanographer) Complete  Complete

## 2024-03-07 NOTE — Significant Event (Signed)
       CROSS COVER NOTE  NAME: Sandra Cuevas MRN: [id number] DOB : 18-Sep-1928 ATTENDING PHYSICIAN: Evonnie Lenis, MD    Date of Service   03/07/2024   HPI/Events of Note   TRH Cross Coverag [facility] Rapid response called for SVT  Hospital course and history reviewed Assessed earlier patient weaned off BIPAP and resp stable on   Interventions   Assessment/Plan: Blood pressure 134/87, pulse 96, temperature 98 F (36.7 C), temperature source Oral, resp. rate (!) 24, height 5' 4 (1.626 m), weight 92.6 kg, SpO2 94%.  Patient ha experienced at least 3 episodes while hospitalized of SVT this event rate 172 duration approx 6 min Patint endorses chest pressure  with event and has experienced theses same feelings of chest pressure and palpitations prior to hospitalization.  Neuro at baseline, alert oriented CV SR BP stable, S1S2 no murmur Resp diminshed but faint crackle in basses  Given history of heart failure, resp failure breast cancer recent chest trauma tamoxifen  therapy, high risk PE  Stat D Dimer Cbc cmp mag and phos Continue tele monitoring        Erminio LITTIE Cone NP [facility] Cross Cover 7pm-7am - check amion for availability Pager [phone]

## 2024-03-07 NOTE — Progress Notes (Signed)
   03/07/24 0800  ReDS Vest / Clip  Station Marker A  Ruler Value 38  ReDS Value Range < 36  ReDS Actual Value 30

## 2024-03-07 NOTE — Progress Notes (Addendum)
 Rapid Response called on patient due to patient have increased heart rate.  Telemetry called and notified staff that pulse was sustaining in the 170s.  Went to room and assessed patient, patient was c/o of chest pain/tightness. Patient was alert and oriented and responsive.   Other vitals were stable. CBG was 246.  NP to room to assess patient and review medications, lab available to draw blood.  Patient episode of tachycardia lasted only a few minutes.  No interventions done at this time. Pulse returned to normal.  Notified family (son Cheryl)  of patient move from ICU and patient cardiac episode.

## 2024-03-07 NOTE — NC FL2 (Signed)
 [place]  MEDICAID FL2 LEVEL OF CARE FORM     IDENTIFICATION  Patient Name: Sandra Cuevas Birthdate: 07-12-1928 Sex: female Admission Date (Current Location): 03/04/2024  [facility] and [place] Number:  [employer] and Address:  [facility],  [address] [place] [address]      Provider Number: [id number]  Attending Physician Name and Address:  Evonnie Lenis, MD  Relative Name and Phone Number:  Sherlon Corrigan (Daughter)  [phone]    Current Level of Care: Hospital Recommended Level of Care: Skilled Nursing Facility Prior Approval Number:    Date Approved/Denied:   PASRR Number: [id number]  Discharge Plan: SNF    Current Diagnoses: Patient Active Problem List   Diagnosis Date Noted   SVT (supraventricular tachycardia) 03/07/2024   Acute on chronic respiratory failure with hypoxia and hypercapnia (HCC) 03/04/2024   COPD with acute exacerbation (HCC) 03/04/2024   Acute on chronic heart failure with preserved ejection fraction (HFpEF) (HCC) 03/04/2024   Obesity (BMI 30-39.9) 03/04/2024   Osteoporosis 08/13/2023   S/P mastectomy, right 07/23/2023   Breast cancer of lower-inner quadrant of right female breast (HCC) 07/15/2023   Chronic respiratory failure with hypoxia (HCC) 04/14/2023   Belching 03/27/2022   Upper airway cough syndrome 10/03/2021   Carotid artery disease 07/03/2021   Aortic stenosis 07/03/2021   Mixed stress and urge urinary incontinence 07/13/2020   Rectocele 07/13/2020   Constipation 07/13/2020   S/P hysterectomy with oophorectomy 07/13/2020   Pelvic pressure in female 07/13/2020   Bilateral sciatica 07/13/2020   Encounter for screening fecal occult blood testing 07/13/2020   Dyspnea 08/10/2019   CKD (chronic kidney disease) stage 3, GFR 30-59 ml/min (HCC) 08/10/2019   Right rib fracture 03/21/2019   Acute respiratory failure (HCC) 03/20/2019   Fever 10/08/2018   Chronic rhinitis 11/17/2017   Pulmonary nodules  03/10/2017   GERD (gastroesophageal reflux disease) 10/14/2016   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   Chest pain 07/14/2016   Diabetes mellitus, type II (HCC) 07/14/2016   CKD (chronic kidney disease), stage II 07/14/2016   Cellulitis 07/12/2016   Cellulitis of right leg 07/12/2016   (HFpEF) heart failure with preserved ejection fraction (HCC) 04/30/2016   Hoarse voice quality 09/05/2015   Chronic obstructive airway disease with asthma (HCC) 04/20/2013   Snoring 04/20/2013   Premature ventricular contractions 03/23/2013   Change in stool 01/06/2012   Shortness of breath 09/26/2011   Carpal tunnel syndrome 11/06/2010   Hyperlipidemia LDL goal <70    Atypical chest pain    Hypothyroidism    Diabetes mellitus (HCC)    EXTERNAL HEMORRHOIDS WITHOUT MENTION COMP 02/12/2010   RECTAL BLEEDING 02/12/2010   CERVICAL CANCER 10/18/2009   Dyslipidemia 10/18/2009   Essential hypertension 10/18/2009   Cough 10/18/2009    Orientation RESPIRATION BLADDER Height & Weight     Self, Time, Situation, Place  O2 (See DC summary) Incontinent Weight: 92.6 kg Height:  5' 4 (162.6 cm)  BEHAVIORAL SYMPTOMS/MOOD NEUROLOGICAL BOWEL NUTRITION STATUS      Continent Diet (See DC summary)  AMBULATORY STATUS COMMUNICATION OF NEEDS Skin   Extensive Assist Verbally Bruising                       Personal Care Assistance Level of Assistance  Bathing, Feeding, Dressing Bathing Assistance: Maximum assistance Feeding assistance: Limited assistance Dressing Assistance: Maximum assistance     Functional Limitations Info  Sight, Hearing, Speech Sight Info: Impaired Hearing Info: Impaired Speech Info: Adequate  SPECIAL CARE FACTORS FREQUENCY  PT (By licensed PT)     PT Frequency: 5 times a week              Contractures Contractures Info: Not present    Additional Factors Info  Code Status, Allergies Code Status Info: DNR-  pre - arrest  INTERVENTIONS DESIRED Allergies Info: NKDA            Current Medications (03/07/2024):  This is the current hospital active medication list Current Facility-Administered Medications  Medication Dose Route Frequency Provider Last Rate Last Admin   acetaminophen  (TYLENOL ) tablet 650 mg  650 mg Oral Q4H PRN Tat, Alm, MD       albuterol  (PROVENTIL ) (2.5 MG/3ML) 0.083% nebulizer solution 2.5 mg  2.5 mg Nebulization TID Tat, Alm, MD   2.5 mg at 03/07/24 9140   arformoterol  (BROVANA ) nebulizer solution 15 mcg  15 mcg Nebulization BID Tat, Alm, MD   15 mcg at 03/07/24 9095   aspirin  EC tablet 81 mg  81 mg Oral Daily Tat, Alm, MD   81 mg at 03/07/24 9177   atorvastatin  (LIPITOR) tablet 40 mg  40 mg Oral Daily Tat, Alm, MD   40 mg at 03/07/24 9176   azithromycin  (ZITHROMAX ) tablet 500 mg  500 mg Oral Daily Tat, Alm, MD   500 mg at 03/07/24 9176   budesonide  (PULMICORT ) nebulizer solution 0.5 mg  0.5 mg Nebulization BID Tat, Alm, MD   0.5 mg at 03/07/24 0901   cefTRIAXone  (ROCEPHIN ) 2 g in sodium chloride  0.9 % 100 mL IVPB  2 g Intravenous Q24H Evonnie Alm, MD 200 mL/hr at 03/07/24 0831 2 g at 03/07/24 0831   Chlorhexidine  Gluconate Cloth 2 % PADS 6 each  6 each Topical Daily Tat, Alm, MD   6 each at 03/07/24 9175   docusate sodium  (COLACE) capsule 100 mg  100 mg Oral QHS Tat, David, MD   100 mg at 03/06/24 2159   heparin  injection 5,000 Units  5,000 Units Subcutaneous Q8H Tat, David, MD   5,000 Units at 03/07/24 0543   insulin  aspart (novoLOG ) injection 0-9 Units  0-9 Units Subcutaneous TID WC Evonnie Alm, MD   2 Units at 03/07/24 9176   iohexol  (OMNIPAQUE ) 350 MG/ML injection 75 mL  75 mL Intravenous Once PRN Tat, Alm, MD       levothyroxine  (SYNTHROID ) tablet 50 mcg  50 mcg Oral QAC breakfast Tat, Alm, MD   50 mcg at 03/07/24 0543   methylPREDNISolone  sodium succinate (SOLU-MEDROL ) 125 mg/2 mL injection 60 mg  60 mg Intravenous Q12H Tat, David, MD   60 mg at 03/07/24 9176   metoprolol tartrate (LOPRESSOR) tablet 12.5 mg  12.5  mg Oral BID Tat, Alm, MD       montelukast  (SINGULAIR ) tablet 10 mg  10 mg Oral QHS Tat, David, MD   10 mg at 03/06/24 2159   ondansetron  (ZOFRAN ) injection 4 mg  4 mg Intravenous Q6H PRN Tat, Alm, MD       pantoprazole  (PROTONIX ) EC tablet 40 mg  40 mg Oral Daily Tat, David, MD   40 mg at 03/07/24 9175   revefenacin (YUPELRI) nebulizer solution 175 mcg  175 mcg Nebulization Daily Tat, Alm, MD   175 mcg at 03/07/24 0908   sodium chloride  flush (NS) 0.9 % injection 3 mL  3 mL Intravenous Q12H Tat, David, MD   3 mL at 03/07/24 0824   sodium chloride  flush (NS) 0.9 % injection 3 mL  3  mL Intravenous PRN Tat, Alm, MD       tamoxifen  (NOLVADEX ) tablet 20 mg  20 mg Oral Daily Tat, David, MD   20 mg at 03/07/24 9177     Discharge Medications: Please see discharge summary for a list of discharge medications.  Relevant Imaging Results:  Relevant Lab Results:   Additional Information SS# [id number]  Sharlyne Stabs, RN

## 2024-03-07 NOTE — Progress Notes (Signed)
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 diverticulitis. No bowel wall thickening or inflammatory change. Vascular/Lymphatic: Aortic atherosclerosis. No enlarged abdominal or pelvic lymph nodes. Reproductive: Status post hysterectomy. No adnexal masses. Other: No free fluid or free intraperitoneal gas. No abdominal wall hernia. Musculoskeletal: No acute or destructive bony abnormalities. Reconstructed images demonstrate no additional findings. IMPRESSION: 1. No acute intrathoracic, intra-abdominal, or intrapelvic trauma. 2. Small bilateral pleural effusions and dependent lower lobe atelectasis, left greater than right. 3. Sigmoid diverticulosis without diverticulitis. 4. Stable borderline enlarged mediastinal adenopathy. 5.  Aortic Atherosclerosis (ICD10-I70.0). Electronically Signed   By: Ozell Daring M.D.   On: 02/27/2024 17:47   CT HEAD WO CONTRAST Result Date: 02/27/2024 EXAM: CT HEAD WITHOUT CONTRAST 02/27/2024 05:37:10 PM TECHNIQUE: CT of the  head was performed without the administration of intravenous contrast. Automated exposure control, iterative reconstruction, and/or weight based adjustment of the mA/kV was utilized to reduce the radiation dose to as low as reasonably achievable. COMPARISON: 01/10/2024 CLINICAL HISTORY: Head trauma, moderate-severe. FINDINGS: BRAIN AND VENTRICLES: No acute hemorrhage. No evidence of acute infarct. Patchy and confluent decreased attenuation throughout  deep and periventricular white matter of cerebral hemispheres bilaterally, compatible with chronic microvascular ischemic disease. Similar remote lacunar infarct in the left corona radiata. Cerebral ventricle sizes concordant with cerebral volume loss. No extra-axial collection. No mass effect or midline shift. ORBITS: Bilateral lens replacement. SINUSES: Bilateral maxillary mucosal thickening. Left mastoid effusion. SOFT TISSUES AND SKULL: No acute soft tissue abnormality. No skull fracture. VASCULATURE: Atherosclerotic calcifications within cavernous internal carotid arteries. IMPRESSION: 1. No acute intracranial abnormality related to head trauma. 2. Chronic microvascular ischemic changes and mild cerebral volume loss. 3. Remote lacunar infarct in the left corona radiata, unchanged. Electronically signed by: Donnice Mania MD 02/27/2024 05:44 PM EST RP Workstation: [id number]   DG Chest Port 1 View Result Date: 02/27/2024 EXAM: 1 VIEW(S) XRAY OF THE CHEST 02/27/2024 03:48:00 PM COMPARISON: 07/22/2023 CLINICAL HISTORY: Trauma. FINDINGS: LUNGS AND PLEURA: Minimal bibasilar subsegmental atelectasis is noted. Small pleural effusions. No pneumothorax. HEART AND MEDIASTINUM: Stable cardiomediastinal silhouette. BONES AND SOFT TISSUES: Status post right shoulder arthroplasty. No acute osseous abnormality. IMPRESSION: 1. Minimal bibasilar subsegmental atelectasis with small pleural effusions. Electronically signed by: Lynwood Seip MD 02/27/2024 03:58 PM EST RP Workstation:  [id number]   DG Hand Complete Left Result Date: 02/27/2024 EXAM: 3 OR MORE VIEW(S) XRAY OF THE LEFT HAND 02/27/2024 03:48:00 PM COMPARISON: None available. CLINICAL HISTORY: Trauma. FINDINGS: BONES AND JOINTS: Probable old healed distal left radial fracture. Degenerative changes are seen involving the distal interphalangeal joints as well as the first interphalangeal joint suggesting osteoarthritis. No acute fracture. No focal osseous lesion. No joint dislocation. SOFT TISSUES: The soft tissues are unremarkable. IMPRESSION: 1. No acute osseous abnormality. 2. Probable old healed distal left radial fracture. 3. Degenerative changes of the distal interphalangeal joints and the first interphalangeal joint, suggestive of osteoarthritis. Electronically signed by: Lynwood Seip MD 02/27/2024 03:56 PM EST RP Workstation: [id number]   DG Elbow Complete Left Result Date: 02/27/2024 EXAM: 3 VIEW(S) XRAY OF THE LEFT ELBOW COMPARISON: None available. CLINICAL HISTORY: trauma FINDINGS: BONES AND JOINTS: No acute fracture. No focal osseous lesion. No joint dislocation. No joint effusion. SOFT TISSUES: The soft tissues are unremarkable. IMPRESSION: 1. No acute abnormality. Electronically signed by: Lynwood Seip MD 02/27/2024 03:54 PM EST RP Workstation: [id number]   DG Pelvis Portable Result Date: 02/27/2024 EXAM: 1 or 2 VIEW(S) XRAY OF THE PELVIS 02/27/2024 03:48:00 PM COMPARISON: 06/01/2020 CLINICAL HISTORY: Trauma FINDINGS: BONES AND JOINTS: No acute fracture. No focal osseous lesion. No joint dislocation. SOFT TISSUES: The soft tissues are unremarkable. IMPRESSION: 1. No acute osseous abnormality or traumatic injury identified. Electronically signed by: Lynwood Seip MD 02/27/2024 03:52 PM EST RP Workstation: [id number]    Alm Schneider, DO  [facility]  If 7PM-7AM, please contact night-coverage [email or web address] Password [email or web address] 03/07/2024, 7:58 AM   LOS: 3 days

## 2024-03-07 NOTE — Discharge Summary (Signed)
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 abnormality. IMPRESSION: 1. Minimal bibasilar subsegmental atelectasis with small pleural effusions. Electronically signed by: Lynwood Seip MD 02/27/2024 03:58 PM EST RP Workstation: [id number]   DG Hand Complete Left Result Date: 02/27/2024 EXAM: 3 OR MORE VIEW(S) XRAY OF THE LEFT HAND 02/27/2024 03:48:00 PM COMPARISON: None available. CLINICAL HISTORY: Trauma. FINDINGS: BONES AND JOINTS: Probable old healed distal left radial fracture. Degenerative changes are seen involving the distal interphalangeal joints as well as the first interphalangeal joint suggesting osteoarthritis. No acute fracture. No focal osseous lesion. No joint dislocation. SOFT TISSUES: The soft tissues are unremarkable. IMPRESSION: 1. No acute osseous abnormality. 2. Probable old healed distal left radial fracture. 3. Degenerative changes of the distal interphalangeal joints and the first interphalangeal joint, suggestive of osteoarthritis. Electronically signed by: Lynwood Seip MD 02/27/2024 03:56 PM EST RP Workstation: [id number]   DG Elbow Complete Left Result Date: 02/27/2024 EXAM: 3 VIEW(S) XRAY  OF THE LEFT ELBOW COMPARISON: None available. CLINICAL HISTORY: trauma FINDINGS: BONES AND JOINTS: No acute fracture. No focal osseous lesion. No joint dislocation. No joint effusion. SOFT TISSUES: The soft tissues are unremarkable. IMPRESSION: 1. No acute abnormality. Electronically signed by: Lynwood Seip MD 02/27/2024 03:54 PM EST RP Workstation: [id number]   DG Pelvis Portable Result Date: 02/27/2024 EXAM: 1 or 2 VIEW(S) XRAY OF THE PELVIS 02/27/2024 03:48:00 PM COMPARISON: 06/01/2020 CLINICAL HISTORY: Trauma FINDINGS: BONES AND JOINTS: No acute fracture. No focal osseous lesion. No joint dislocation. SOFT TISSUES: The soft tissues are unremarkable. IMPRESSION: 1. No acute osseous abnormality or traumatic injury identified. Electronically signed by: Lynwood Seip MD 02/27/2024 03:52 PM EST RP Workstation: [id number]    Microbiology: Results for orders placed or performed during the hospital encounter of 03/04/24  SARS Coronavirus 2 by RT PCR (hospital order, performed in [facility] hospital lab) *cepheid single result test* Anterior Nasal Swab     Status: None   Collection Time: 03/04/24  9:17 AM   Specimen: Anterior Nasal Swab  Result Value Ref Range Status   SARS Coronavirus 2 by RT PCR NEGATIVE NEGATIVE Final    Comment: (NOTE) SARS-CoV-2 target nucleic acids are NOT DETECTED.  The SARS-CoV-2 RNA is generally detectable in upper and lower respiratory specimens during the acute phase of infection. The lowest concentration of SARS-CoV-2 viral copies this assay can detect is 250 copies / mL. A negative result does not preclude SARS-CoV-2 infection and should not be used as the sole basis for treatment or other patient management decisions.  A negative result  may occur with improper specimen collection / handling, submission of specimen other than nasopharyngeal swab, presence of viral mutation(s) within the areas targeted by this assay, and inadequate number of viral copies (<250 copies /  mL). A negative result must be combined with clinical observations, patient history, and epidemiological information.  Fact Sheet for Patients:   roadlaptop.co.za  Fact Sheet for Healthcare Providers: http://kim-miller.com/  This test is not yet approved or  cleared by the [place]  FDA and has been authorized for detection and/or diagnosis of SARS-CoV-2 by FDA under an Emergency Use Authorization (EUA).  This EUA will remain in effect (meaning this test can be used) for the duration of the COVID-19 declaration under Section 564(b)(1) of the Act, 21 U.S.C. section 360bbb-3(b)(1), unless the authorization is terminated or revoked sooner.  Performed at [facility], [address], [place], [place] [address]   Blood culture (routine x 2)     Status: None (Preliminary result)   Collection Time: 03/04/24  9:54 AM   Specimen: BLOOD LEFT HAND  Result Value Ref Range Status   Specimen Description   Final    BLOOD LEFT HAND BOTTLES DRAWN AEROBIC AND ANAEROBIC   Special Requests Blood Culture adequate volume  Final   Culture   Final    NO GROWTH 4 DAYS Performed at [facility], [address] [place], [place] [address]    Report Status PENDING  Incomplete  Blood culture (routine x 2)     Status: None (Preliminary result)   Collection Time: 03/04/24  9:55 AM   Specimen: BLOOD RIGHT FOREARM  Result Value Ref Range Status   Specimen Description   Final    BLOOD RIGHT FOREARM BOTTLES DRAWN AEROBIC AND ANAEROBIC   Special Requests Blood Culture adequate volume  Final   Culture   Final    NO GROWTH 4 DAYS Performed at [facility], [address], [place], [place] [address]    Report Status PENDING  Incomplete  MRSA Next Gen by PCR, Nasal     Status: None   Collection Time: 03/04/24 12:26 PM   Specimen: Nasal Mucosa; Nasal Swab  Result Value Ref Range Status   MRSA by PCR Next Gen NOT DETECTED NOT DETECTED Final    Comment: (NOTE) The  GeneXpert MRSA Assay (FDA approved for NASAL specimens only), is one component of a comprehensive MRSA colonization surveillance program. It is not intended to diagnose MRSA infection nor to guide or monitor treatment for MRSA infections. Test performance is not FDA approved in patients less than 72 years old. Performed at [facility], [address], [place], [place] [address]   Respiratory (~20 pathogens) panel by PCR     Status: None   Collection Time: 03/04/24 12:57 PM   Specimen: Nasopharyngeal Swab; Respiratory  Result Value Ref Range Status   Adenovirus NOT DETECTED NOT DETECTED Final   Coronavirus 229E NOT DETECTED NOT DETECTED Final    Comment: (NOTE) The Coronavirus on the Respiratory Panel, DOES NOT test for the novel  Coronavirus (2019 nCoV)    Coronavirus HKU1 NOT DETECTED NOT DETECTED Final   Coronavirus NL63 NOT DETECTED NOT DETECTED Final   Coronavirus OC43 NOT DETECTED NOT DETECTED Final   Metapneumovirus NOT DETECTED NOT DETECTED Final   Rhinovirus / Enterovirus NOT DETECTED NOT DETECTED Final   Influenza A NOT DETECTED NOT DETECTED Final   Influenza B NOT DETECTED NOT DETECTED Final   Parainfluenza Virus 1 NOT DETECTED NOT DETECTED Final   Parainfluenza Virus 2  NOT DETECTED NOT DETECTED Final   Parainfluenza Virus 3 NOT DETECTED NOT DETECTED Final   Parainfluenza Virus 4 NOT DETECTED NOT DETECTED Final   Respiratory Syncytial Virus NOT DETECTED NOT DETECTED Final   Bordetella pertussis NOT DETECTED NOT DETECTED Final   Bordetella Parapertussis NOT DETECTED NOT DETECTED Final   Chlamydophila pneumoniae NOT DETECTED NOT DETECTED Final   Mycoplasma pneumoniae NOT DETECTED NOT DETECTED Final    Comment: Performed at [facility], [address], [place], [place] [address]    Labs: CBC: Recent Labs  Lab 03/04/24 [id number] 03/05/24 0503 03/07/24 0553 03/08/24 0507  WBC 13.6* 4.9 8.1 6.9  NEUTROABS 7.6  --   --   --   HGB 9.6* 7.9* 9.0* 9.1*  HCT 32.7*  26.2* 28.2* 28.4*  MCV 109.7* 107.4* 103.3* 101.4*  PLT 110* 120* 143* 141*   Basic Metabolic Panel: Recent Labs  Lab 03/04/24 0931 03/05/24 0503 03/06/24 0316 03/07/24 0553 03/08/24 0507  NA 144 144 142 140 139  K 5.0 4.4 4.1 4.0 3.9  CL 100 99 96* 95* 94*  CO2 40* 41* 40* 38* 38*  GLUCOSE 199* 216* 251* 234* 275*  BUN 18 26* 32* 31* 31*  CREATININE 0.81 0.87 0.95 0.90 0.96  CALCIUM  9.4 9.0 9.1 9.0 8.8*  MG  --  2.2 2.0 2.1 1.9  PHOS  --  3.2  --   --   --    Liver Function Tests: Recent Labs  Lab 03/04/24 0931 03/05/24 0503  AST 32 26  ALT 19 19  ALKPHOS 105 77  BILITOT 0.5 0.4  PROT 6.5 5.7*  ALBUMIN 3.6 3.2*   CBG: Recent Labs  Lab 03/07/24 1138 03/07/24 1655 03/07/24 2038 03/08/24 0744 03/08/24 1117  GLUCAP 197* 239* 225* 236* 223*    Discharge time spent: greater than 30 minutes.  Signed: Alm Schneider, MD [facility] 03/08/2024

## 2024-03-07 NOTE — Progress Notes (Signed)
 Telemetry tech notified primary nurse of sustained SVT that began at 0606.  The code cart was rolled to the patient's room and the pads were placed.  The RRT was called at 0607.  The SVT ended at 0612.  Meanwhile the health care providers arrived and cared for the patient until she became hemodynamically stable.  Labs were drawn at the provider's request.

## 2024-03-08 ENCOUNTER — Other Ambulatory Visit: Payer: Self-pay | Admitting: Adult Health

## 2024-03-08 LAB — BASIC METABOLIC PANEL WITH GFR
Anion gap: 7 (ref 5–15)
BUN: 31 mg/dL — ABNORMAL HIGH (ref 8–23)
CO2: 38 mmol/L — ABNORMAL HIGH (ref 22–32)
Calcium: 8.8 mg/dL — ABNORMAL LOW (ref 8.9–10.3)
Chloride: 94 mmol/L — ABNORMAL LOW (ref 98–111)
Creatinine, Ser: 0.96 mg/dL (ref 0.44–1.00)
GFR, Estimated: 55 mL/min — ABNORMAL LOW (ref >60)
Glucose, Bld: 275 mg/dL — ABNORMAL HIGH (ref 70–99)
Potassium: 3.9 mmol/L (ref 3.5–5.1)
Sodium: 139 mmol/L (ref 135–145)

## 2024-03-08 LAB — GLUCOSE, CAPILLARY
Glucose-Capillary: 236 mg/dL — ABNORMAL HIGH (ref 70–99)
Glucose-Capillary: 223 mg/dL — ABNORMAL HIGH (ref 70–99)

## 2024-03-08 LAB — MAGNESIUM: Magnesium: 1.9 mg/dL (ref 1.7–2.4)

## 2024-03-08 LAB — CBC
HCT: 28.4 % — ABNORMAL LOW (ref 36.0–46.0)
Hemoglobin: 9.1 g/dL — ABNORMAL LOW (ref 12.0–15.0)
MCH: 32.5 pg (ref 26.0–34.0)
MCHC: 32 g/dL (ref 30.0–36.0)
MCV: 101.4 fL — ABNORMAL HIGH (ref 80.0–100.0)
Platelets: 141 K/uL — ABNORMAL LOW (ref 150–400)
RBC: 2.8 MIL/uL — ABNORMAL LOW (ref 3.87–5.11)
RDW: 15.1 % (ref 11.5–15.5)
WBC: 6.9 K/uL (ref 4.0–10.5)
nRBC: 0.4 % — ABNORMAL HIGH (ref 0.0–0.2)

## 2024-03-08 MED ORDER — ZOLPIDEM TARTRATE 5 MG PO TABS: 5.0000 mg | ORAL_TABLET | Freq: Every evening | ORAL | 0 refills | Status: DC | PRN

## 2024-03-08 MED ORDER — NYSTATIN 100000 UNIT/GM EX POWD: Freq: Three times a day (TID) | CUTANEOUS | Status: DC

## 2024-03-08 MED ORDER — FUROSEMIDE 40 MG PO TABS: 40.0000 mg | ORAL_TABLET | Freq: Every day | ORAL | Status: DC

## 2024-03-08 MED ORDER — INSULIN ASPART 100 UNIT/ML IJ SOLN: 0.0000 [IU] | Freq: Three times a day (TID) | INTRAMUSCULAR | Status: DC

## 2024-03-08 MED ORDER — NYSTATIN 100000 UNIT/GM EX POWD
Freq: Three times a day (TID) | CUTANEOUS | Status: DC
  Filled 2024-03-08: qty 15

## 2024-03-08 MED ORDER — METOPROLOL TARTRATE 25 MG PO TABS: 12.5000 mg | ORAL_TABLET | Freq: Two times a day (BID) | ORAL | Status: DC

## 2024-03-08 MED ORDER — PREDNISONE 10 MG PO TABS: 50.0000 mg | ORAL_TABLET | Freq: Every day | ORAL | Status: DC

## 2024-03-08 MED ORDER — PREDNISONE 20 MG PO TABS: 50.0000 mg | ORAL_TABLET | Freq: Every day | ORAL | Status: DC

## 2024-03-08 NOTE — TOC Progression Note (Signed)
 Transition of Care [facility]) - Progression Note    Patient Details  Name: Sandra Cuevas MRN: [id number] Date of Birth: Sep 07, 1928  Transition of Care [facility]) CM/SW Contact  Mcarthur Saddie Kim, [place] Phone Number: 03/08/2024, 9:14 AM  Clinical Narrative: Pt and son accept bed at [facility]. Facility notified. LCSW updated Tammy with HTA. Auth pending.       Expected Discharge Plan: Skilled Nursing Facility Barriers to Discharge: Continued Medical Work up               Expected Discharge Plan and Services In-house Referral: Clinical Social Work Discharge Planning Services: NA Post Acute Care Choice: Skilled Nursing Facility Living arrangements for the past 2 months: Single Family Home                 DME Arranged: N/A DME Agency: NA                   Social Drivers of Health (SDOH) Interventions SDOH Screenings   Food Insecurity: No Food Insecurity (03/04/2024)  Housing: Low Risk  (03/04/2024)  Transportation Needs: No Transportation Needs (03/04/2024)  Utilities: Not At Risk (03/04/2024)  Alcohol  Screen: Low Risk  (07/13/2020)  Depression (PHQ2-9): Medium Risk (02/18/2024)  Financial Resource Strain: Low Risk  (07/13/2020)  Physical Activity: Inactive (07/13/2020)  Social Connections: Socially Isolated (03/07/2024)  Stress: No Stress Concern Present (07/13/2020)  Tobacco Use: Low Risk  (02/04/2024)    Readmission Risk Interventions    03/05/2024    1:11 PM 07/23/2023    5:21 PM 04/21/2023    1:15 PM  Readmission Risk Prevention Plan  Post Dischage Appt  Complete   Medication Screening  Complete   Transportation Screening Complete Complete Complete  PCP or Specialist Appt within 3-5 Days Complete    HRI or Home Care Consult Complete  Complete  Social Work Consult for Recovery Care Planning/Counseling Complete  Complete  Palliative Care Screening Not Applicable  Not Applicable  Medication Review Oceanographer) Complete  Complete

## 2024-03-08 NOTE — Progress Notes (Signed)
 Spoke with nurse Alan at [facility] center provided report.

## 2024-03-08 NOTE — TOC Transition Note (Signed)
 Transition of Care [facility]) - Discharge Note   Patient Details  Name: Sandra Cuevas MRN: [id number] Date of Birth: 05/26/1928  Transition of Care [facility]) CM/SW Contact:  Mcarthur Saddie Kim, LCSW Phone Number: 03/08/2024, 12:31 PM   Clinical Narrative:  Pt d/c today to [facility]. Pt, son, and facility aware and agreeable. Authorization received. D/C summary sent to SNF. Pt will transfer with staff. RN given number to call report.      Final next level of care: Skilled Nursing Facility Barriers to Discharge: Barriers Resolved   Patient Goals and CMS Choice Patient states their goals for this hospitalization and ongoing recovery are:: To feel better          Discharge Placement PASRR number recieved: 03/08/24            Patient chooses bed at: [facility] Patient to be transferred to facility by: staff Name of family member notified: son Patient and family notified of of transfer: 03/08/24  Discharge Plan and Services Additional resources added to the After Visit Summary for   In-house Referral: Clinical Social Work Discharge Planning Services: NA Post Acute Care Choice: Skilled Nursing Facility          DME Arranged: N/A DME Agency: NA                  Social Drivers of Health (SDOH) Interventions SDOH Screenings   Food Insecurity: No Food Insecurity (03/04/2024)  Housing: Low Risk  (03/04/2024)  Transportation Needs: No Transportation Needs (03/04/2024)  Utilities: Not At Risk (03/04/2024)  Alcohol  Screen: Low Risk  (07/13/2020)  Depression (PHQ2-9): Medium Risk (02/18/2024)  Financial Resource Strain: Low Risk  (07/13/2020)  Physical Activity: Inactive (07/13/2020)  Social Connections: Socially Isolated (03/07/2024)  Stress: No Stress Concern Present (07/13/2020)  Tobacco Use: Low Risk  (02/04/2024)     Readmission Risk Interventions    03/05/2024    1:11 PM 07/23/2023    5:21 PM 04/21/2023    1:15 PM  Readmission Risk Prevention Plan  Post Dischage  Appt  Complete   Medication Screening  Complete   Transportation Screening Complete Complete Complete  PCP or Specialist Appt within 3-5 Days Complete    HRI or Home Care Consult Complete  Complete  Social Work Consult for Recovery Care Planning/Counseling Complete  Complete  Palliative Care Screening Not Applicable  Not Applicable  Medication Review Oceanographer) Complete  Complete

## 2024-03-09 ENCOUNTER — Non-Acute Institutional Stay (SKILLED_NURSING_FACILITY): Payer: Self-pay | Admitting: Adult Health

## 2024-03-09 ENCOUNTER — Encounter: Payer: Self-pay | Admitting: Adult Health

## 2024-03-09 ENCOUNTER — Ambulatory Visit: Payer: Self-pay | Admitting: Physician Assistant

## 2024-03-09 DIAGNOSIS — Z170 Estrogen receptor positive status [ER+]: Secondary | ICD-10-CM

## 2024-03-09 DIAGNOSIS — J4489 Other specified chronic obstructive pulmonary disease: Secondary | ICD-10-CM | POA: Diagnosis not present

## 2024-03-09 DIAGNOSIS — I471 Supraventricular tachycardia, unspecified: Secondary | ICD-10-CM | POA: Diagnosis not present

## 2024-03-09 DIAGNOSIS — I700 Atherosclerosis of aorta: Secondary | ICD-10-CM | POA: Insufficient documentation

## 2024-03-09 DIAGNOSIS — I5032 Chronic diastolic (congestive) heart failure: Secondary | ICD-10-CM

## 2024-03-09 DIAGNOSIS — J9611 Chronic respiratory failure with hypoxia: Secondary | ICD-10-CM

## 2024-03-09 DIAGNOSIS — N183 Chronic kidney disease, stage 3 unspecified: Secondary | ICD-10-CM | POA: Insufficient documentation

## 2024-03-09 DIAGNOSIS — E1169 Type 2 diabetes mellitus with other specified complication: Secondary | ICD-10-CM | POA: Diagnosis not present

## 2024-03-09 DIAGNOSIS — E1122 Type 2 diabetes mellitus with diabetic chronic kidney disease: Secondary | ICD-10-CM | POA: Insufficient documentation

## 2024-03-09 DIAGNOSIS — Z794 Long term (current) use of insulin: Secondary | ICD-10-CM

## 2024-03-09 DIAGNOSIS — D696 Thrombocytopenia, unspecified: Secondary | ICD-10-CM | POA: Insufficient documentation

## 2024-03-09 DIAGNOSIS — E039 Hypothyroidism, unspecified: Secondary | ICD-10-CM

## 2024-03-09 DIAGNOSIS — C50311 Malignant neoplasm of lower-inner quadrant of right female breast: Secondary | ICD-10-CM

## 2024-03-09 DIAGNOSIS — K5901 Slow transit constipation: Secondary | ICD-10-CM

## 2024-03-09 DIAGNOSIS — K219 Gastro-esophageal reflux disease without esophagitis: Secondary | ICD-10-CM

## 2024-03-09 DIAGNOSIS — E1142 Type 2 diabetes mellitus with diabetic polyneuropathy: Secondary | ICD-10-CM | POA: Diagnosis not present

## 2024-03-09 DIAGNOSIS — N1831 Chronic kidney disease, stage 3a: Secondary | ICD-10-CM

## 2024-03-09 DIAGNOSIS — D631 Anemia in chronic kidney disease: Secondary | ICD-10-CM | POA: Insufficient documentation

## 2024-03-09 LAB — CULTURE, BLOOD (ROUTINE X 2)
Culture: NO GROWTH
Culture: NO GROWTH
Special Requests: ADEQUATE
Special Requests: ADEQUATE

## 2024-03-09 NOTE — Progress Notes (Signed)
 NGS Myeloid results reviewed.  Patient has SF3B1 mutation (tier 1), of strong clinical significance. 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  Based on the above, as well as her progressive macrocytic anemia, I am recommending bone marrow biopsy in order to confirm diagnosis and initiate treatment.  However this must be carefully weighed with patient's age and other comorbidities.  No answer on patient's home phone number. I attempted to contact patient's daughter Sandra Cuevas at [phone]) - left message. Will attempt again tomorrow.  Pleasant Sandra Barefoot, PA-C  03/09/24 4:50 PM
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
## 2024-03-11 ENCOUNTER — Encounter: Payer: Self-pay | Admitting: Internal Medicine

## 2024-03-11 ENCOUNTER — Non-Acute Institutional Stay (SKILLED_NURSING_FACILITY): Payer: Self-pay | Admitting: Internal Medicine

## 2024-03-11 DIAGNOSIS — N1831 Chronic kidney disease, stage 3a: Secondary | ICD-10-CM

## 2024-03-11 DIAGNOSIS — E1142 Type 2 diabetes mellitus with diabetic polyneuropathy: Secondary | ICD-10-CM

## 2024-03-11 DIAGNOSIS — I471 Supraventricular tachycardia, unspecified: Secondary | ICD-10-CM | POA: Diagnosis not present

## 2024-03-11 DIAGNOSIS — J441 Chronic obstructive pulmonary disease with (acute) exacerbation: Secondary | ICD-10-CM | POA: Diagnosis not present

## 2024-03-11 DIAGNOSIS — Z170 Estrogen receptor positive status [ER+]: Secondary | ICD-10-CM | POA: Diagnosis not present

## 2024-03-11 DIAGNOSIS — C50311 Malignant neoplasm of lower-inner quadrant of right female breast: Secondary | ICD-10-CM | POA: Diagnosis not present

## 2024-03-11 DIAGNOSIS — D696 Thrombocytopenia, unspecified: Secondary | ICD-10-CM

## 2024-03-11 DIAGNOSIS — E039 Hypothyroidism, unspecified: Secondary | ICD-10-CM

## 2024-03-11 DIAGNOSIS — D631 Anemia in chronic kidney disease: Secondary | ICD-10-CM | POA: Diagnosis not present

## 2024-03-11 NOTE — Assessment & Plan Note (Addendum)
 Current H/H 9.1/28.4 with macrocytic indices with an MCV of 101.4.  Despite macrocytosis folate normal at greater than 20 and B12 high normal at 919.   Current creatinine 0.96 with a GFR 55 indicating CKD stage IIIa.  Severe CKD related anemia would not be suspected at this level.  Will continue to monitor for bleeding dyscrasias.
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
## 2024-03-11 NOTE — Assessment & Plan Note (Signed)
 Despite episode of SVT with rates in the 160s to 170s; TSH therapeutic.  No change in L thyroxine dose indicated.

## 2024-03-11 NOTE — Assessment & Plan Note (Signed)
 Rate adequately controlled and rhythm regular.  Continue to monitor.

## 2024-03-11 NOTE — Assessment & Plan Note (Addendum)
 While hospitalized with acute on chronic respiratory failure associated with bilateral effusions and increased interstitial opacities; glucoses ranged from initial value 199 up to a peak of 275 in the context of parenteral and oral steroids.  A1c 6.3%, indicating adequate control.  Monitor fasting glucoses; they should return to baseline as steroids are weaned and discontinued.

## 2024-03-11 NOTE — Assessment & Plan Note (Signed)
 Clinically stable on current regimen with adequate O2 sats on present O2 settings and in context of pulmonary toilet.  No change, continue to monitor.

## 2024-03-11 NOTE — Assessment & Plan Note (Signed)
 Breast cancer is considered active and she is on tamoxifen  maintenance therapy.  This will be continued at the SNF.

## 2024-03-11 NOTE — Patient Instructions (Signed)
 See assessment and plan under each diagnosis in the problem list and acutely for this visit

## 2024-03-11 NOTE — Assessment & Plan Note (Signed)
 Initial platelet count 110,000; final 144,000.  No bleeding dyscrasias reported.  Continue to monitor.

## 2024-03-15 ENCOUNTER — Other Ambulatory Visit (HOSPITAL_COMMUNITY)
Admission: RE | Admit: 2024-03-15 | Discharge: 2024-03-15 | Disposition: A | Source: Skilled Nursing Facility | Attending: Adult Health | Admitting: Adult Health

## 2024-03-15 LAB — BASIC METABOLIC PANEL WITH GFR
Anion gap: 8 (ref 5–15)
BUN: 24 mg/dL — ABNORMAL HIGH (ref 8–23)
CO2: 37 mmol/L — ABNORMAL HIGH (ref 22–32)
Calcium: 8.8 mg/dL — ABNORMAL LOW (ref 8.9–10.3)
Chloride: 95 mmol/L — ABNORMAL LOW (ref 98–111)
Creatinine, Ser: 0.86 mg/dL (ref 0.44–1.00)
GFR, Estimated: >60 mL/min (ref >60)
Glucose, Bld: 166 mg/dL — ABNORMAL HIGH (ref 70–99)
Potassium: 4.2 mmol/L (ref 3.5–5.1)
Sodium: 140 mmol/L (ref 135–145)

## 2024-03-15 LAB — CBC
HCT: 28 % — ABNORMAL LOW (ref 36.0–46.0)
Hemoglobin: 8.6 g/dL — ABNORMAL LOW (ref 12.0–15.0)
MCH: 32.6 pg (ref 26.0–34.0)
MCHC: 30.7 g/dL (ref 30.0–36.0)
MCV: 106.1 fL — ABNORMAL HIGH (ref 80.0–100.0)
Platelets: 147 K/uL — ABNORMAL LOW (ref 150–400)
RBC: 2.64 MIL/uL — ABNORMAL LOW (ref 3.87–5.11)
RDW: 14.6 % (ref 11.5–15.5)
WBC: 7.9 K/uL (ref 4.0–10.5)
nRBC: 0 % (ref 0.0–0.2)

## 2024-03-17 ENCOUNTER — Other Ambulatory Visit: Payer: Self-pay | Admitting: Adult Health

## 2024-03-17 MED ORDER — ZOLPIDEM TARTRATE 5 MG PO TABS: 5.0000 mg | ORAL_TABLET | Freq: Every evening | ORAL | 0 refills | Status: DC | PRN

## 2024-03-17 NOTE — Telephone Encounter (Signed)
 Multiple attempts have been made to contact patient at the numbers listed in her chart, but I have been unable to speak with patient or her family to date.  Labs obtained by other provider show worsening anemia with Hgb 8.1. 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 We will await return call from patient/family, and will also forward this message to patient's PCP at SNF.  Pleasant CHRISTELLA Barefoot, PA-C 03/17/24 5:08 PM

## 2024-03-18 NOTE — Progress Notes (Signed)
 Connected family member with Pleasant Barefoot, PA via phone.

## 2024-03-18 NOTE — Telephone Encounter (Signed)
 Spoke with patient's son, Dallie Patton at 11:45 AM earlier today. I discussed my concerns regarding patient's anemia in the presence of SF3B1 mutation, and that this may indicate MDS or other myeloid neoplasm.  We discussed that bone marrow biopsy would be needed to definitively diagnose and decide on treatment options.  Mr. Brinker verbalized understanding of this, stated that he would need to discuss with his mother (the patient), as well as his other siblings.  We will add patient to schedule next week in order to discuss this in more detail with patient as well as family.  We will check preappointment labs same day to include CBC/D, Cystatin C, LDH, BMP, EPO, as well as blood bank sample.  Pleasant Barefoot PA-C 03/18/2024 5:40 PM

## 2024-03-23 ENCOUNTER — Other Ambulatory Visit: Payer: Self-pay | Admitting: Internal Medicine

## 2024-03-23 NOTE — Progress Notes (Unsigned)
 [facility] [address], [place] [address]   CLINIC:  Medical Oncology/Hematology  PCP:  Marvine Rush, MD [address] [place] [address] [phone]   REASON FOR VISIT:  Follow-up for macrocytic anemia (Right sided breast cancer addressed at visit on 02/18/2024)  CURRENT THERAPY: Under workup  INTERVAL HISTORY:   [name] 89 y.o. female is seen today along with her daughter Sandra Cuevas to discuss results of NGS myeloid panel in regards to her macrocytic anemia She was last seen by Pleasant Barefoot PA-C on 02/18/2024. In the interim since last visit, she was hospitalized from 03/04/2024 through 03/08/2024 due to acute on chronic respiratory failure with COPD exacerbation. She is currently residing at [facility] for rehab purposes.  At today's visit, she reports feeling fair.  She does note increased fatigue and weakness. She  reports 50% energy and 100% appetite.   She  is maintaining stable weight at this time.  She is taking Vitamin B12 tablet 1 mg daily. Iron tablet was stopped at visit in November 2025. No rectal bleeding or melena. No B symptoms. She reports some intermittent tremors and muscle jerks for the past few months.  ASSESSMENT & PLAN:  1.  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  Macrocytic since 2020. - She did have some acute worsening of her anemia after her mastectomy, with Hgb dropping from 9.2 to 7.6, but returned to baseline between 9.0-10.0. - Onset of mild thrombocytopenia in October 2025. - Hematology workup (07/15/2023): Normal LDH, DAT negative. Reticulocytes 1.8% (hypoproliferative for degree of anemia) Normal copper , folate. Marginal B12 375, elevated MMA 393. Normal ferritin 193, iron saturation 17%. - She received vitamin B12 injection in April 2025, started on B12 1 mg tablet daily. - Kidney  function is normal (creatinine 0.88/GFR >60), although she has had AKI's in the past could have put her GFR in the range of CKD stage IIIa - Most recent labs (02/11/2024): Hgb 9.6/MCV 106.9.  Platelets 113.  Normal WBC/differential. Normal kidney function. Ferritin 475, iron saturation 25%. Normal vitamin B12 1011. - She was told to take iron tablet twice daily after mastectomy in April 2025.  Iron tablets stopped in November 2025 due to elevated ferritin levels. - No history of liver disease (CT abdomen/pelvis from December 2020 showed unremarkable liver, normal size spleen).  - No rectal bleeding or melena. - NGS myeloid panel (02/18/2024): SF3B1 mutation (tier 1), of strong clinical significance; frequently associated with MDS and MDS/MPN entities. - Labs today (03/23/2024): Hgb 8.3/MCV 107.0, platelets 97 Normal LDH. Creatinine 1.05/GFR 49 PENDING labs include erythropoietin  and Cystatin C - DIFFERENTIAL DIAGNOSIS strongly favors MDS.   - PLAN:  We discussed strong clinical suspicion for MDS in the setting of progressive macrocytic anemia, thrombocytopenia, and SF3B1 mutation.  We discussed overall prognosis of MDS, including inherent risk of transformation to AML. - We discussed that she could opt for conservative treatment with Retacrit injections, even without BMBx to confirm diagnosis.  Patient expresses strong desire to proceed with bone marrow biopsy.  She wants as much information as possible so that she can get the best treatment possible. - Order placed for CT-guided BMBx at [facility] - In the meantime, CBC/BBS every 2 weeks, transfuse if Hgb <8.0 - MD visit with Dr. Davonna 1 week after BMBx  OTHER ISSUES (addressed in full at office visit  on 02/18/2024) - Stage I right-sided breast cancer s/p right mastectomy: Daily tamoxifen .  Left breast screening mammogram in March 2026.  Breast exam and office visit in May 2026. - Osteoporosis: Recommended dental clearance and  consideration of Prolia.  Repeat DEXA due April 2027.  PLAN SUMMARY:  >> CBC/BBS every 2 weeks (transfuse if Hgb < 8.0) >> [facility]x [facility] (January 2026) >> MD VISIT w/ DR. KANDALA 1 week after BMBx   AFTER NEXT VISIT: - Screening mammogram due in March 2026 (07/03/2024) - Breast exam May 2026 - Bone density/DEXA April 2027     REVIEW OF SYSTEMS:   Review of Systems  Constitutional:  Positive for fatigue. Negative for appetite change, chills, diaphoresis, fever and unexpected weight change.  HENT:   Negative for lump/mass and nosebleeds.   Eyes:  Negative for eye problems.  Respiratory:  Positive for shortness of breath (on supplemental oxygen ). Negative for cough and hemoptysis.   Cardiovascular:  Negative for chest pain, leg swelling and palpitations.  Gastrointestinal:  Negative for abdominal distention, abdominal pain, blood in stool, constipation, diarrhea, nausea and vomiting.  Genitourinary:  Negative for hematuria.   Musculoskeletal:  Negative for back pain.  Skin: Negative.   Neurological:  Positive for headaches and numbness. Negative for dizziness and light-headedness.  Hematological:  Does not bruise/bleed easily.  Psychiatric/Behavioral:  Positive for sleep disturbance.     PHYSICAL EXAM:  ECOG PERFORMANCE STATUS: 2 - Symptomatic, <50% confined to bed  Vitals:   03/24/24 1009  BP: (!) 157/67  Pulse: 67  Resp: 19  Temp: 98.9 F (37.2 C)  SpO2: 93%   Filed Weights   03/24/24 1009  Weight: 200 lb (90.7 kg)   Physical Exam Constitutional:      Appearance: Normal appearance. She is obese.  Cardiovascular:     Heart sounds: Normal heart sounds.  Pulmonary:     Breath sounds: Normal breath sounds.  Chest:     Comments: BREAST EXAM 02/18/2024: Right mastectomy site is well-healed.  Left breast without any discrete nodules or masses.  No axillary lymphadenopathy bilaterally. Neurological:     General: No focal deficit present.     Mental Status: Mental  status is at baseline.  Psychiatric:        Behavior: Behavior normal. Behavior is cooperative.    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    COPD (chronic obstructive pulmonary disease) (HCC)    Diabetes mellitus    GERD (gastroesophageal reflux disease)    History of cardiovascular stress test 06/01/2010   EF 71% - no evidence of ischemia, normal left ventricular systolic function   History of hysterectomy 1965   Hyperlipidemia    Hypertension    Hypothyroidism    RECTAL BLEEDING 02/12/2010   Qualifier: Diagnosis of   By: Debrah MD, Robert D        Right rib fracture 03/21/2019   Shortness of breath    on exertion   Tubular adenoma of colon    Past Surgical History:  Procedure Laterality Date   ABDOMINAL HYSTERECTOMY     BREAST BIOPSY Right 07/08/2023   path pending   BREAST BIOPSY Right 07/08/2023   US  RT BREAST BX W LOC DEV 1ST LESION IMG BX SPEC US  GUIDE 07/08/2023 Lennon Nest, MD [facility]-ULTRASOUND   CARDIAC SURGERY  catherization   HEMORRHOID SURGERY     TOTAL MASTECTOMY Right 07/23/2023   Procedure: MASTECTOMY, SIMPLE;  Surgeon: Mavis Anes, MD;  Location: [facility];  Service: General;  Laterality: Right;   TOTAL SHOULDER REPLACEMENT Right     SOCIAL HISTORY:  Social History   Socioeconomic History   Marital status: Widowed    Spouse name: Not on file   Number of children: 2   Years of education: 8   Highest education level: Not on file  Occupational History   Occupation: Retired  Tobacco Use   Smoking status: Never   Smokeless tobacco: Never  Vaping Use   Vaping status: Never Used  Substance and Sexual Activity   Alcohol  use: No   Drug use: Never   Sexual activity: Not Currently    Birth control/protection: Surgical    Comment: hyst  Other Topics Concern   Not on file  Social History Narrative   Right handed   Tea  daily   Lives alone   Social Drivers of Health   Tobacco Use: Low Risk (03/11/2024)   Patient History    Smoking Tobacco Use: Never    Smokeless Tobacco Use: Never    Passive Exposure: Not on file  Financial Resource Strain: Not on file  Food Insecurity: No Food Insecurity (03/04/2024)   Epic    Worried About Programme Researcher, Broadcasting/film/video in the Last Year: Never true    Ran Out of Food in the Last Year: Never true  Transportation Needs: No Transportation Needs (03/04/2024)   Epic    Lack of Transportation (Medical): No    Lack of Transportation (Non-Medical): No  Physical Activity: Not on file  Stress: Not on file  Social Connections: Socially Isolated (03/07/2024)   Social Connection and Isolation Panel    Frequency of Communication with Friends and Family: More than three times a week    Frequency of Social Gatherings with Friends and Family: More than three times a week    Attends Religious Services: Never    Database Administrator or Organizations: No    Attends Banker Meetings: Never    Marital Status: Widowed  Intimate Partner Violence: Not At Risk (03/04/2024)   Epic    Fear of Current or Ex-Partner: No    Emotionally Abused: No    Physically Abused: No    Sexually Abused: No  Depression (PHQ2-9): Medium Risk (03/24/2024)   Depression (PHQ2-9)    PHQ-2 Score: 5  Alcohol  Screen: Not on file  Housing: Low Risk (03/04/2024)   Epic    Unable to Pay for Housing in the Last Year: No    Number of Times Moved in the Last Year: 0    Homeless in the Last Year: No  Utilities: Not At Risk (03/04/2024)   Epic    Threatened with loss of utilities: No  Health Literacy: Not on file    FAMILY HISTORY:  Family History  Problem Relation Age of Onset   Heart disease Father        heart problems   Kidney failure Mother    Diabetes Mother    Leukemia Brother    Diabetes Brother    Colon cancer Sister    Leukemia Brother    Diabetes Brother    Pancreatic cancer  Brother    Bone cancer Sister     CURRENT MEDICATIONS:  Outpatient Encounter Medications as of 03/24/2024  Medication Sig   acetaminophen  (TYLENOL ) 650 MG CR tablet Take 650  mg by mouth every 6 (six) hours as needed for pain.   albuterol  (PROVENTIL ) (2.5 MG/3ML) 0.083% nebulizer solution Take 3 mLs (2.5 mg total) by nebulization every 6 (six) hours as needed for wheezing or shortness of breath.   aspirin  EC 81 MG tablet Take 1 tablet (81 mg total) by mouth daily. Swallow whole.   atorvastatin  (LIPITOR) 40 MG tablet TAKE 1 TABLET(40 MG) BY MOUTH DAILY AT 6 PM   calcium  carbonate (OSCAL) 1500 (600 Ca) MG TABS tablet Take 600 mg by mouth at bedtime.   Cholecalciferol (VITAMIN D3 PO) Take 2,000 Units by mouth at bedtime.   docusate sodium  (COLACE) 100 MG capsule Take 100 mg by mouth at bedtime.   famotidine  (PEPCID ) 20 MG tablet Take 1 tablet (20 mg total) by mouth 2 (two) times daily.   fluticasone  (FLONASE ) 50 MCG/ACT nasal spray SHAKE LIQUID AND USE 2 SPRAYS IN EACH NOSTRIL DAILY   furosemide  (LASIX ) 40 MG tablet Take 1 tablet (40 mg total) by mouth daily.   gabapentin  (NEURONTIN ) 600 MG tablet Take 600 mg by mouth 3 (three) times daily.   insulin  aspart (NOVOLOG  FLEXPEN) 100 UNIT/ML FlexPen Inject 5 Units into the skin 3 (three) times daily with meals. For cbg>=150   levothyroxine  (SYNTHROID , LEVOTHROID) 50 MCG tablet Take 50 mcg by mouth daily before breakfast.   metoprolol  tartrate (LOPRESSOR ) 25 MG tablet Take 0.5 tablets (12.5 mg total) by mouth 2 (two) times daily.   Multiple Vitamin (MULTIVITAMIN WITH MINERALS) TABS tablet Take 1 tablet by mouth at bedtime.   Multiple Vitamins-Minerals (PRESERVISION AREDS 2+MULTI VIT PO) Take 1 tablet by mouth in the morning.   nystatin  (MYCOSTATIN /NYSTOP ) powder Apply topically 3 (three) times daily.   olopatadine (HM EYE ALLERGY ITCH/RED RELIEF) 0.1 % ophthalmic solution Place 1-2 drops into both eyes 2 (two) times daily as needed for allergies.    omeprazole  (PRILOSEC) 20 MG capsule TAKE 1 CAPSULE(20 MG) BY MOUTH DAILY   polyethylene glycol (MIRALAX  / GLYCOLAX ) 17 g packet Take 17 g by mouth daily as needed (constipation).   potassium chloride  (KLOR-CON ) 10 MEQ tablet Take 10 mEq by mouth in the morning.   predniSONE  (DELTASONE ) 10 MG tablet Take 5 tablets (50 mg total) by mouth daily with breakfast. And decrease by 1 tablet daily   tamoxifen  (NOLVADEX ) 20 MG tablet Take 1 tablet (20 mg total) by mouth daily.   zolpidem  (AMBIEN ) 5 MG tablet Take 1 tablet (5 mg total) by mouth at bedtime as needed for sleep.   [DISCONTINUED] montelukast  (SINGULAIR ) 10 MG tablet Take 10 mg by mouth in the morning.   No facility-administered encounter medications on file as of 03/24/2024.    ALLERGIES:  Allergies[1]  LABORATORY DATA:  I have reviewed the labs as listed.  CBC    Component Value Date/Time   WBC 7.0 03/24/2024 0918   RBC 2.57 (L) 03/24/2024 0918   HGB 8.3 (L) 03/24/2024 0918   HCT 27.5 (L) 03/24/2024 0918   PLT 97 (L) 03/24/2024 0918   MCV 107.0 (H) 03/24/2024 0918   MCH 32.3 03/24/2024 0918   MCHC 30.2 03/24/2024 0918   RDW 14.4 03/24/2024 0918   LYMPHSABS 1.2 03/24/2024 0918   MONOABS 0.9 03/24/2024 0918   EOSABS 0.2 03/24/2024 0918   BASOSABS 0.0 03/24/2024 0918      Latest Ref Rng & Units 03/24/2024    9:18 AM 03/15/2024    8:00 AM 03/08/2024    5:07 AM  CMP  Glucose 70 - 99  mg/dL 795  833  724   BUN 8 - 23 mg/dL 26  24  31    Creatinine 0.44 - 1.00 mg/dL 8.94  9.13  9.03   Sodium 135 - 145 mmol/L 143  140  139   Potassium 3.5 - 5.1 mmol/L 4.2  4.2  3.9   Chloride 98 - 111 mmol/L 98  95  94   CO2 22 - 32 mmol/L 36  37  38   Calcium  8.9 - 10.3 mg/dL 9.0  8.8  8.8     DIAGNOSTIC IMAGING:  I have independently reviewed the relevant imaging and discussed with the patient.   WRAP UP:  All questions were answered. The patient knows to call the clinic with any problems, questions or concerns.  Medical decision  making: HIGH (extensive discussion regarding diagnosis and prognosis of MDS, risk/benefits of bone marrow biopsy, and potential treatment options)  Time spent on visit: I spent 30 minutes counseling the patient face to face. The total time spent in the appointment was 55 minutes and more than 50% was on counseling.  Pleasant CHRISTELLA Barefoot, PA-C  03/24/2024 12:24 PM      [1] No Known Allergies

## 2024-03-24 ENCOUNTER — Inpatient Hospital Stay

## 2024-03-24 ENCOUNTER — Inpatient Hospital Stay: Attending: Oncology | Admitting: Physician Assistant

## 2024-03-24 VITALS — BP 157/67 | HR 67 | Temp 98.9°F | Resp 19 | Ht 64.0 in | Wt 200.0 lb

## 2024-03-24 DIAGNOSIS — D539 Nutritional anemia, unspecified: Secondary | ICD-10-CM

## 2024-03-24 DIAGNOSIS — R7989 Other specified abnormal findings of blood chemistry: Secondary | ICD-10-CM | POA: Diagnosis not present

## 2024-03-24 DIAGNOSIS — Z806 Family history of leukemia: Secondary | ICD-10-CM | POA: Diagnosis not present

## 2024-03-24 DIAGNOSIS — Z79810 Long term (current) use of selective estrogen receptor modulators (SERMs): Secondary | ICD-10-CM | POA: Diagnosis not present

## 2024-03-24 DIAGNOSIS — Z808 Family history of malignant neoplasm of other organs or systems: Secondary | ICD-10-CM | POA: Insufficient documentation

## 2024-03-24 DIAGNOSIS — Z800 Family history of malignant neoplasm of digestive organs: Secondary | ICD-10-CM | POA: Insufficient documentation

## 2024-03-24 DIAGNOSIS — M810 Age-related osteoporosis without current pathological fracture: Secondary | ICD-10-CM | POA: Insufficient documentation

## 2024-03-24 DIAGNOSIS — D696 Thrombocytopenia, unspecified: Secondary | ICD-10-CM | POA: Diagnosis not present

## 2024-03-24 DIAGNOSIS — C50911 Malignant neoplasm of unspecified site of right female breast: Secondary | ICD-10-CM | POA: Insufficient documentation

## 2024-03-24 LAB — CBC WITH DIFFERENTIAL/PLATELET
Abs Immature Granulocytes: 0.02 K/uL (ref 0.00–0.07)
Basophils Absolute: 0 K/uL (ref 0.0–0.1)
Basophils Relative: 0 %
Eosinophils Absolute: 0.2 K/uL (ref 0.0–0.5)
Eosinophils Relative: 3 %
HCT: 27.5 % — ABNORMAL LOW (ref 36.0–46.0)
Hemoglobin: 8.3 g/dL — ABNORMAL LOW (ref 12.0–15.0)
Immature Granulocytes: 0 %
Lymphocytes Relative: 17 %
Lymphs Abs: 1.2 K/uL (ref 0.7–4.0)
MCH: 32.3 pg (ref 26.0–34.0)
MCHC: 30.2 g/dL (ref 30.0–36.0)
MCV: 107 fL — ABNORMAL HIGH (ref 80.0–100.0)
Monocytes Absolute: 0.9 K/uL (ref 0.1–1.0)
Monocytes Relative: 12 %
Neutro Abs: 4.7 K/uL (ref 1.7–7.7)
Neutrophils Relative %: 68 %
Platelets: 97 K/uL — ABNORMAL LOW (ref 150–400)
RBC: 2.57 MIL/uL — ABNORMAL LOW (ref 3.87–5.11)
RDW: 14.4 % (ref 11.5–15.5)
WBC: 7 K/uL (ref 4.0–10.5)
nRBC: 0 % (ref 0.0–0.2)

## 2024-03-24 LAB — SAMPLE TO BLOOD BANK

## 2024-03-24 LAB — BASIC METABOLIC PANEL WITH GFR
Anion gap: 9 (ref 5–15)
BUN: 26 mg/dL — ABNORMAL HIGH (ref 8–23)
CO2: 36 mmol/L — ABNORMAL HIGH (ref 22–32)
Calcium: 9 mg/dL (ref 8.9–10.3)
Chloride: 98 mmol/L (ref 98–111)
Creatinine, Ser: 1.05 mg/dL — ABNORMAL HIGH (ref 0.44–1.00)
GFR, Estimated: 49 mL/min — ABNORMAL LOW (ref >60)
Glucose, Bld: 204 mg/dL — ABNORMAL HIGH (ref 70–99)
Potassium: 4.2 mmol/L (ref 3.5–5.1)
Sodium: 143 mmol/L (ref 135–145)

## 2024-03-24 LAB — LACTATE DEHYDROGENASE: LDH: 196 U/L (ref 105–235)

## 2024-03-24 NOTE — Patient Instructions (Signed)
 [facility] **VISIT SUMMARY & IMPORTANT INSTRUCTIONS **   You were seen today by Pleasant Barefoot PA-C for your anemia.    MYELODYSPLASTIC SYNDROME (MDS) Your labs showed genetic mutation called SF3B1. This mutation makes it highly likely that you a condition called myelodysplastic syndrome. Myelodysplastic syndrome is a type of bone marrow and blood cancer.  Although it does not spread to other parts of your body, it can cause severe anemia and other blood abnormalities. Myelodysplastic syndrome can also progress into a more serious type of blood cancer called acute myeloid leukemia (AML). We will schedule you for bone marrow biopsy (January 2026) to confirm this diagnosis. You will see Dr. Davonna 1 week after your bone marrow biopsy to discuss what your treatment options are. In the meantime, we will check your blood every 2 weeks and will give a blood transfusion if your hemoglobin is less than 8.0.  ** Thank you for trusting me with your healthcare!  I strive to provide all of my patients with quality care at each visit.  If you receive a survey for this visit, I would be so grateful to you for taking the time to provide feedback.  Thank you in advance!  ~ Cabot Cromartie                                        Dr. Mickiel Davonna Pleasant Barefoot, PA-C          Delon Hope, NP   - - - - - - - - - - - - - - - - - -    Thank you for choosing [facility] at [facility] to provide your oncology and hematology care.  To afford each patient quality time with our provider, please arrive at least 15 minutes before your scheduled appointment time.   If you have a lab appointment with the Cancer Center please come in thru the Main Entrance and check in at the main information desk.  You need to re-schedule your appointment should you arrive 10 or more minutes late.  We strive to give you quality time with our providers, and  arriving late affects you and other patients whose appointments are after yours.  Also, if you no show three or more times for appointments you may be dismissed from the clinic at the providers discretion.     Again, thank you for choosing [facility].  Our hope is that these requests will decrease the amount of time that you wait before being seen by our physicians.       _____________________________________________________________  Should you have questions after your visit to [facility], please contact our office at [phone] and follow the prompts.  Our office hours are 8:00 a.m. and 4:30 p.m. Monday - Friday.  Please note that voicemails left after 4:00 p.m. may not be returned until the following business day.  We are closed weekends and major holidays.  You do have access to a nurse 24-7, just call the main number to the clinic [phone] and do not press any options, hold on the line and a nurse will answer the phone.    For prescription refill requests, have your pharmacy contact our office and allow 72 hours.

## 2024-03-25 LAB — ERYTHROPOIETIN: Erythropoietin: 25.7 m[IU]/mL — ABNORMAL HIGH (ref 2.6–18.5)

## 2024-03-25 LAB — MISC LABCORP TEST (SEND OUT): LabCorp test name: 121251

## 2024-03-26 ENCOUNTER — Non-Acute Institutional Stay (SKILLED_NURSING_FACILITY): Payer: Self-pay | Admitting: Adult Health

## 2024-03-26 DIAGNOSIS — N1831 Chronic kidney disease, stage 3a: Secondary | ICD-10-CM

## 2024-03-26 DIAGNOSIS — I5032 Chronic diastolic (congestive) heart failure: Secondary | ICD-10-CM | POA: Diagnosis not present

## 2024-03-26 DIAGNOSIS — D631 Anemia in chronic kidney disease: Secondary | ICD-10-CM

## 2024-03-26 DIAGNOSIS — J4489 Other specified chronic obstructive pulmonary disease: Secondary | ICD-10-CM

## 2024-03-26 NOTE — Progress Notes (Signed)
 " Location:  [facility] Room Number: 160 P Place of Service:  SNF (31)   CODE STATUS: Full Code   Allergies[1]  Chief Complaint  Patient presents with   Care Plan Meeting    HPI:  We have come together for her care plan meeting. Family present. BIMS 15/15 mood 8/30: not sleeping well; poor energy; restless at times. BCAT 44/50. Using wheelchair without falls. She requires moderate to maximum assist with adl care. She is frequently incontinent of bladder and bowel. Dietary: setup for meals regular diet appetite 76-100% weight is 203 pounds. She uses 02 at 3 liters. Therapy: ambulate 150 feet with rolling walker and supervision. Upper body setup; lower body contact guard; transfers supervision; steps contact guard. 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    COPD (chronic obstructive pulmonary disease) (HCC)    Diabetes mellitus    GERD (gastroesophageal reflux disease)    History of cardiovascular stress test 06/01/2010   EF 71% - no evidence of ischemia, normal left ventricular systolic function   History of hysterectomy 1965   Hyperlipidemia    Hypertension    Hypothyroidism    RECTAL BLEEDING 02/12/2010   Qualifier: Diagnosis of   By: Debrah MD, Robert D        Right rib fracture 03/21/2019   Shortness of breath    on exertion   Tubular adenoma of colon     Past Surgical History:  Procedure Laterality Date   ABDOMINAL HYSTERECTOMY     BREAST BIOPSY Right 07/08/2023   path pending   BREAST BIOPSY Right 07/08/2023   US  RT BREAST BX W LOC DEV 1ST LESION IMG BX SPEC US  GUIDE 07/08/2023 Lennon Nest, MD [facility]   CARDIAC SURGERY      catherization   HEMORRHOID SURGERY     TOTAL MASTECTOMY Right 07/23/2023   Procedure: MASTECTOMY, SIMPLE;  Surgeon: Mavis Anes, MD;  Location: [facility];  Service: General;  Laterality: Right;   TOTAL SHOULDER REPLACEMENT Right     Social History   Socioeconomic History   Marital status: Widowed    Spouse name: Not on file   Number of children: 2   Years of education: 8   Highest education level: Not on file  Occupational History   Occupation: Retired  Tobacco Use   Smoking status: Never   Smokeless tobacco: Never  Vaping Use   Vaping status: Never Used  Substance and Sexual Activity   Alcohol  use: No   Drug use: Never   Sexual activity: Not Currently    Birth control/protection: Surgical    Comment: hyst  Other Topics Concern   Not on file  Social History Narrative   Right handed   Tea daily   Lives alone   Social Drivers of Health   Tobacco Use: Low Risk (03/26/2024)   Patient History    Smoking Tobacco Use: Never    Smokeless Tobacco Use: Never    Passive Exposure: Not on file  Financial Resource Strain: Not on file  Food Insecurity: No Food Insecurity (03/04/2024)   Epic    Worried About Running Out of  Food in the Last Year: Never true    Ran Out of Food in the Last Year: Never true  Transportation Needs: No Transportation Needs (03/04/2024)   Epic    Lack of Transportation (Medical): No    Lack of Transportation (Non-Medical): No  Physical Activity: Not on file  Stress: Not on file  Social Connections: Socially Isolated (03/07/2024)   Social Connection and Isolation Panel    Frequency of Communication with Friends and Family: More than three times a week    Frequency of Social Gatherings with Friends and Family: More than three times a week    Attends Religious Services: Never    Database Administrator or Organizations: No    Attends Banker Meetings: Never    Marital Status: Widowed  Intimate Partner Violence: Not At Risk (03/04/2024)    Epic    Fear of Current or Ex-Partner: No    Emotionally Abused: No    Physically Abused: No    Sexually Abused: No  Depression (PHQ2-9): Medium Risk (03/24/2024)   Depression (PHQ2-9)    PHQ-2 Score: 5  Alcohol  Screen: Not on file  Housing: Low Risk (03/04/2024)   Epic    Unable to Pay for Housing in the Last Year: No    Number of Times Moved in the Last Year: 0    Homeless in the Last Year: No  Utilities: Not At Risk (03/04/2024)   Epic    Threatened with loss of utilities: No  Health Literacy: Not on file   Family History  Problem Relation Age of Onset   Heart disease Father        heart problems   Kidney failure Mother    Diabetes Mother    Leukemia Brother    Diabetes Brother    Colon cancer Sister    Leukemia Brother    Diabetes Brother    Pancreatic cancer Brother    Bone cancer Sister       VITAL SIGNS BP (!) 143/56   Pulse 60   Temp 97.8 F (36.6 C)   Resp 20   Ht 5' 4 (1.626 m)   Wt 200 lb (90.7 kg)   SpO2 98%   BMI 34.33 kg/m   Outpatient Encounter Medications as of 03/26/2024  Medication Sig   acetaminophen  (TYLENOL ) 650 MG CR tablet Take 650 mg by mouth every 6 (six) hours as needed for pain.   albuterol  (PROVENTIL ) (2.5 MG/3ML) 0.083% nebulizer solution Take 3 mLs (2.5 mg total) by nebulization every 6 (six) hours as needed for wheezing or shortness of breath.   aspirin  EC 81 MG tablet Take 1 tablet (81 mg total) by mouth daily. Swallow whole.   atorvastatin  (LIPITOR) 40 MG tablet TAKE 1 TABLET(40 MG) BY MOUTH DAILY AT 6 PM   calcium  carbonate (OSCAL) 1500 (600 Ca) MG TABS tablet Take 600 mg by mouth at bedtime.   Cholecalciferol (VITAMIN D3 PO) Take 2,000 Units by mouth at bedtime.   famotidine  (PEPCID ) 20 MG tablet Take 1 tablet (20 mg total) by mouth 2 (two) times daily.   fluticasone  (FLONASE ) 50 MCG/ACT nasal spray SHAKE LIQUID AND USE 2 SPRAYS IN EACH NOSTRIL DAILY   furosemide  (LASIX ) 40 MG tablet Take 1 tablet (40 mg total) by mouth  daily.   gabapentin  (NEURONTIN ) 600 MG tablet Take 600 mg by mouth 3 (three) times daily.   insulin  aspart (NOVOLOG  FLEXPEN) 100 UNIT/ML FlexPen Inject 5 Units into the skin 3 (three) times daily with  meals. For cbg>=150   levothyroxine  (SYNTHROID , LEVOTHROID) 50 MCG tablet Take 50 mcg by mouth daily before breakfast.   melatonin 5 MG TABS Take 5 mg by mouth at bedtime.   metoprolol  tartrate (LOPRESSOR ) 25 MG tablet Take 0.5 tablets (12.5 mg total) by mouth 2 (two) times daily.   montelukast  (SINGULAIR ) 10 MG tablet Take 10 mg by mouth daily.   Multiple Vitamin (MULTIVITAMIN WITH MINERALS) TABS tablet Take 1 tablet by mouth at bedtime.   Multiple Vitamins-Minerals (PRESERVISION AREDS 2+MULTI VIT PO) Take 1 tablet by mouth in the morning.   nystatin  (MYCOSTATIN /NYSTOP ) powder Apply topically 3 (three) times daily.   olopatadine (HM EYE ALLERGY ITCH/RED RELIEF) 0.1 % ophthalmic solution Place 1-2 drops into both eyes 2 (two) times daily as needed for allergies.   omeprazole  (PRILOSEC) 20 MG capsule TAKE 1 CAPSULE(20 MG) BY MOUTH DAILY   polyethylene glycol (MIRALAX  / GLYCOLAX ) 17 g packet Take 17 g by mouth daily as needed (constipation).   potassium chloride  (KLOR-CON ) 10 MEQ tablet Take 10 mEq by mouth in the morning.   sennosides-docusate sodium  (SENOKOT-S) 8.6-50 MG tablet Take 1 tablet by mouth daily.   tamoxifen  (NOLVADEX ) 20 MG tablet Take 1 tablet (20 mg total) by mouth daily.   docusate sodium  (COLACE) 100 MG capsule Take 100 mg by mouth at bedtime. (Patient not taking: Reported on 03/26/2024)   predniSONE  (DELTASONE ) 10 MG tablet Take 5 tablets (50 mg total) by mouth daily with breakfast. And decrease by 1 tablet daily (Patient not taking: Reported on 03/26/2024)   zolpidem  (AMBIEN ) 5 MG tablet Take 1 tablet (5 mg total) by mouth at bedtime as needed for sleep. (Patient not taking: Reported on 03/26/2024)   No facility-administered encounter medications on file as of 03/26/2024.      SIGNIFICANT DIAGNOSTIC EXAMS  LABS:   03-04-24: wbc 13.6; hgb 9.6; hct 32.7; mcv 109.7 plt 110; glucose 199; bun 18; creat 0.81; k+ 5.0; na++ 144; ca 9.4 gfr >60; protein 6.5 albumin  3.6 hgb A1c 6.3; BNP 702.0 03-06-24: wbc vitamin B 12: 919; folate >20; tsh 1.300 free t4: 0.73 03-08-24: wbc 6.9; hgb 9.1; hct 28.4; mcv 101.4 plt 141; glucose 275; bun 31; creat 0.96; k+ 3.9; na++ 139; ca 8.8 gfr 55; mag 1.9  Review of Systems  Constitutional:  Negative for malaise/fatigue.  Respiratory:  Negative for cough and shortness of breath.   Cardiovascular:  Negative for chest pain, palpitations and leg swelling.  Gastrointestinal:  Negative for abdominal pain, constipation and heartburn.  Musculoskeletal:  Negative for back pain, joint pain and myalgias.  Skin: Negative.   Neurological:  Negative for dizziness.  Psychiatric/Behavioral:  The patient is not nervous/anxious.     Physical Exam Constitutional:      General: She is not in acute distress.    Appearance: She is well-developed. She is not diaphoretic.  Neck:     Thyroid : No thyromegaly.  Cardiovascular:     Rate and Rhythm: Normal rate and regular rhythm.     Heart sounds: Normal heart sounds.  Pulmonary:     Effort: Pulmonary effort is normal. No respiratory distress.     Breath sounds: Normal breath sounds.  Abdominal:     General: Bowel sounds are normal. There is no distension.     Palpations: Abdomen is soft.     Tenderness: There is no abdominal tenderness.  Musculoskeletal:        General: Normal range of motion.     Cervical back: Neck supple.  Right lower leg: Edema present.     Left lower leg: Edema present.  Lymphadenopathy:     Cervical: No cervical adenopathy.  Skin:    General: Skin is warm and dry.  Neurological:     Mental Status: She is alert and oriented to person, place, and time.  Psychiatric:        Mood and Affect: Mood normal.     ASSESSMENT/ PLAN:  TODAY  Chronic heart failure  with preserved ejection fraction Chronic obstructive airway disease with asthma Anemia in stage 3a chronic kidney disease  Will continue current medications Will continue therapy as directed Will continue to monitor her status.   Time spent with patient: 40 minutes: medications; dietary; therapy.    Barnie Seip NP [facility]   call [phone]      [1] No Known Allergies  "

## 2024-03-29 ENCOUNTER — Other Ambulatory Visit: Payer: Self-pay | Admitting: Adult Health

## 2024-03-29 ENCOUNTER — Encounter: Payer: Self-pay | Admitting: Adult Health

## 2024-03-29 ENCOUNTER — Non-Acute Institutional Stay (SKILLED_NURSING_FACILITY): Payer: Self-pay | Admitting: Adult Health

## 2024-03-29 DIAGNOSIS — J441 Chronic obstructive pulmonary disease with (acute) exacerbation: Secondary | ICD-10-CM | POA: Diagnosis not present

## 2024-03-29 DIAGNOSIS — Z170 Estrogen receptor positive status [ER+]: Secondary | ICD-10-CM

## 2024-03-29 DIAGNOSIS — J9611 Chronic respiratory failure with hypoxia: Secondary | ICD-10-CM | POA: Diagnosis not present

## 2024-03-29 DIAGNOSIS — N183 Chronic kidney disease, stage 3 unspecified: Secondary | ICD-10-CM

## 2024-03-29 DIAGNOSIS — I129 Hypertensive chronic kidney disease with stage 1 through stage 4 chronic kidney disease, or unspecified chronic kidney disease: Secondary | ICD-10-CM | POA: Diagnosis not present

## 2024-03-29 DIAGNOSIS — I5032 Chronic diastolic (congestive) heart failure: Secondary | ICD-10-CM | POA: Diagnosis not present

## 2024-03-29 DIAGNOSIS — E785 Hyperlipidemia, unspecified: Secondary | ICD-10-CM

## 2024-03-29 MED ORDER — TAMOXIFEN CITRATE 20 MG PO TABS: 20.0000 mg | ORAL_TABLET | Freq: Every day | ORAL | 0 refills | Status: AC

## 2024-03-29 MED ORDER — NOVOLOG FLEXPEN 100 UNIT/ML ~~LOC~~ SOPN: 5.0000 [IU] | PEN_INJECTOR | Freq: Three times a day (TID) | SUBCUTANEOUS | 0 refills | Status: AC

## 2024-03-29 MED ORDER — ATORVASTATIN CALCIUM 40 MG PO TABS: ORAL_TABLET | ORAL | 0 refills | Status: AC

## 2024-03-29 MED ORDER — POTASSIUM CHLORIDE ER 10 MEQ PO TBCR: 10.0000 meq | EXTENDED_RELEASE_TABLET | Freq: Every morning | ORAL | 0 refills | Status: AC

## 2024-03-29 MED ORDER — NYSTATIN 100000 UNIT/GM EX POWD: Freq: Three times a day (TID) | CUTANEOUS | 0 refills | Status: AC

## 2024-03-29 MED ORDER — FUROSEMIDE 40 MG PO TABS: 40.0000 mg | ORAL_TABLET | Freq: Every day | ORAL | 0 refills | Status: AC

## 2024-03-29 MED ORDER — MONTELUKAST SODIUM 10 MG PO TABS: 10.0000 mg | ORAL_TABLET | Freq: Every day | ORAL | 0 refills | Status: AC

## 2024-03-29 MED ORDER — LEVOTHYROXINE SODIUM 50 MCG PO TABS: 50.0000 ug | ORAL_TABLET | Freq: Every day | ORAL | 0 refills | Status: AC

## 2024-03-29 MED ORDER — ALBUTEROL SULFATE (2.5 MG/3ML) 0.083% IN NEBU: 2.5000 mg | INHALATION_SOLUTION | Freq: Four times a day (QID) | RESPIRATORY_TRACT | 0 refills | Status: AC | PRN

## 2024-03-29 MED ORDER — METOPROLOL TARTRATE 25 MG PO TABS: 12.5000 mg | ORAL_TABLET | Freq: Two times a day (BID) | ORAL | 0 refills | Status: AC

## 2024-03-29 MED ORDER — GABAPENTIN 600 MG PO TABS: 600.0000 mg | ORAL_TABLET | Freq: Three times a day (TID) | ORAL | 0 refills | Status: AC
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
## 2024-04-05 ENCOUNTER — Encounter: Payer: Self-pay | Admitting: *Deleted

## 2024-04-07 ENCOUNTER — Inpatient Hospital Stay

## 2024-04-07 DIAGNOSIS — D539 Nutritional anemia, unspecified: Secondary | ICD-10-CM

## 2024-04-07 LAB — CBC
HCT: 28.2 % — ABNORMAL LOW (ref 36.0–46.0)
Hemoglobin: 8.5 g/dL — ABNORMAL LOW (ref 12.0–15.0)
MCH: 32.2 pg (ref 26.0–34.0)
MCHC: 30.1 g/dL (ref 30.0–36.0)
MCV: 106.8 fL — ABNORMAL HIGH (ref 80.0–100.0)
Platelets: 185 K/uL (ref 150–400)
RBC: 2.64 MIL/uL — ABNORMAL LOW (ref 3.87–5.11)
RDW: 14.3 % (ref 11.5–15.5)
WBC: 6.8 K/uL (ref 4.0–10.5)
nRBC: 0 % (ref 0.0–0.2)

## 2024-04-07 LAB — SAMPLE TO BLOOD BANK

## 2024-04-15 ENCOUNTER — Ambulatory Visit: Admitting: Emergency Medicine

## 2024-04-15 ENCOUNTER — Encounter: Payer: Self-pay | Admitting: Emergency Medicine

## 2024-04-15 VITALS — BP 124/60 | HR 82 | Wt 205.0 lb

## 2024-04-15 DIAGNOSIS — J9611 Chronic respiratory failure with hypoxia: Secondary | ICD-10-CM

## 2024-04-15 DIAGNOSIS — J4489 Other specified chronic obstructive pulmonary disease: Secondary | ICD-10-CM

## 2024-04-15 DIAGNOSIS — R918 Other nonspecific abnormal finding of lung field: Secondary | ICD-10-CM

## 2024-04-15 DIAGNOSIS — J9612 Chronic respiratory failure with hypercapnia: Secondary | ICD-10-CM

## 2024-04-15 NOTE — Assessment & Plan Note (Signed)
 Her right upper lobe nodule is stable in the interval.  The other nodules have been stable going back to 2021.  We will repeat the CT scan of the chest in November 2026.

## 2024-04-15 NOTE — Patient Instructions (Signed)
 We reviewed your CT scans of the chest from October and November.  Your pulmonary nodules are stable.  Good news. We will repeat your CT scan of the chest without contrast in November 2026. Please continue to keep your albuterol  available to use 1 nebulizer treatment when needed for shortness of breath, chest tightness, wheezing. Continue your oxygen  at 3 L/min Follow-up in our office in November so we can review your CT scan.  Please call if you have problems so we can see you sooner.

## 2024-04-15 NOTE — Progress Notes (Signed)
" ° °  Subjective:    Patient ID: Sandra Cuevas, female    DOB: Jan 22, 1929, 89 y.o.   MRN: [id number]  COPD Her past medical history is significant for COPD.   ROV 04/15/2024 --89 year old woman with a history of COPD/fixed asthma, upper airway irritation syndrome with chronic cough in the setting of hiatal hernia/GERD, HFpEF, diabetes, mild aortic stenosis, allergic rhinitis.  She also has a history of breast cancer on tamoxifen .  She has been on oxygen  at 3 L/min since she was treated for pneumonia in early 2025.  She was in the hospital in late November 2025 for acute combined respiratory failure presumably due to an exacerbation of COPD.  She required BiPAP for respiratory acidosis.  She was treated with corticosteroids, empiric antibiotics.  She was also diuresed. She is improved, feels better. She is dealing with tremor. She occasionally hears wheeze with exertion. Uses albuterol  about 2x a week.   CT scan of the chest, abdomen, pelvis done on 02/27/2024 reviewed by me showed some borderline mediastinal adenopathy, small bilateral pleural effusions left greater than right, bilateral lower lobe atelectasis.  CT chest 02/04/2024 reviewed by me showed patent central airways, unchanged irregular nodular opacity anterior right upper lobe 1.1 cm, some platelike atelectasis of the right middle lobe and lingula, unchanged 5 mm left lower lobe nodule stable since 2021, stable 9 mm right basilar nodule stable since 2020   Review of Systems As per HPI      Objective:   Physical Exam Vitals:   04/15/24 1127  BP: 124/60  Pulse: 82  SpO2: 92%  Weight: 205 lb (93 kg)     Gen: Pleasant, well-nourished, in no distress,  normal affect  ENT: No lesions,  mouth clear,  oropharynx clear, very hoarse gravelly voice  Neck: No JVD, no stridor but she is hoarse.   Lungs: No use of accessory muscles, clear without rales or rhonchi, no wheeze.   Cardiovascular: RRR, heart sounds normal, no murmur or  gallops, no peripheral edema  Musculoskeletal: No deformities  Neuro: alert, non focal  Skin: Warm, no lesions or rashes      Assessment & Plan:  Pulmonary nodules Her right upper lobe nodule is stable in the interval.  The other nodules have been stable going back to 2021.  We will repeat the CT scan of the chest in November 2026.  Chronic obstructive airway disease with asthma (HCC) Fixed asthma.  Recent hospitalization may have been an acute exacerbation of obstructive lung disease but sounded more like volume overload.  She was treated for both.  She uses albuterol  about 2 times a week with good response.  We considered starting maintenance BD therapy but for now we will hold off.  Plan to refill her albuterol .  Chronic respiratory failure with hypoxia and hypercapnia (HCC) Due to underlying cardiac disease, obstructive lung disease, deconditioning and restriction from obesity.  She is on 3 L/min.  She has not required BiPAP with the exception of when she had an acute flare and was hospitalized.  Plan to continue same oxygen .   Time spent 26 minutes discussing symptoms, history, recent testing including CT scan chest, plans for diagnostics and treatment.  Lamar Chris, MD, PhD 04/15/2024, 12:06 PM [facility] [phone] or if no answer [phone]  "

## 2024-04-15 NOTE — Assessment & Plan Note (Addendum)
 Fixed asthma.  Recent hospitalization may have been an acute exacerbation of obstructive lung disease but sounded more like volume overload.  She was treated for both.  She uses albuterol  about 2 times a week with good response.  We considered starting maintenance BD therapy but for now we will hold off.  Plan to refill her albuterol .

## 2024-04-15 NOTE — Assessment & Plan Note (Signed)
 Due to underlying cardiac disease, obstructive lung disease, deconditioning and restriction from obesity.  She is on 3 L/min.  She has not required BiPAP with the exception of when she had an acute flare and was hospitalized.  Plan to continue same oxygen .

## 2024-04-16 ENCOUNTER — Other Ambulatory Visit: Payer: Self-pay | Admitting: Radiology

## 2024-04-16 DIAGNOSIS — D539 Nutritional anemia, unspecified: Secondary | ICD-10-CM

## 2024-04-16 NOTE — H&P (Signed)
 "   Chief Complaint: Macrocytic anemia/thrombocytopenia with SF3B1 mutation; referred for image guided bone marrow biopsy to rule out MDS  Referring Provider(s): Pennington,R  PA-C  Supervising Physician: Hughes Simmonds  Patient Status: [facility] - Out-pt  History of Present Illness: Sandra Cuevas is a 89 y.o. 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 She is scheduled today for image guided bone marrow biopsy to rule out MDS.  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    COPD (chronic obstructive pulmonary disease) (HCC)    Diabetes mellitus    GERD (gastroesophageal reflux disease)    History of cardiovascular stress test 06/01/2010   EF 71% - no evidence of ischemia, normal left ventricular systolic function   History of hysterectomy 1965   Hyperlipidemia    Hypertension    Hypothyroidism    RECTAL BLEEDING 02/12/2010   Qualifier: Diagnosis of   By: Debrah MD, Robert D        Right rib fracture 03/21/2019   Shortness of breath    on exertion   Tubular adenoma of colon     Past Surgical History:  Procedure Laterality Date   ABDOMINAL HYSTERECTOMY     BREAST BIOPSY Right 07/08/2023   path pending   BREAST BIOPSY Right 07/08/2023   US  RT BREAST BX W LOC DEV 1ST LESION IMG BX SPEC US  GUIDE 07/08/2023 Lennon Nest, MD [facility]-ULTRASOUND   CARDIAC SURGERY     catherization   HEMORRHOID SURGERY     TOTAL MASTECTOMY Right 07/23/2023   Procedure: MASTECTOMY, SIMPLE;  Surgeon: Mavis Anes, MD;  Location: [facility] OR[facility];  Service: General;  Laterality: Right;   TOTAL SHOULDER REPLACEMENT Right     Allergies: Patient has no known allergies.  Medications: Prior to Admission medications  Medication Sig Start Date End Date Taking?  Authorizing Provider  acetaminophen  (TYLENOL ) 650 MG CR tablet Take 650 mg by mouth every 6 (six) hours as needed for pain.    [provider]  albuterol  (PROVENTIL ) (2.5 MG/3ML) 0.083% nebulizer solution Take 3 mLs (2.5 mg total) by nebulization every 6 (six) hours as needed for wheezing or shortness of breath. 03/29/24   Landy Barnie RAMAN, NP  amLODipine  (NORVASC ) 5 MG tablet Take 5 mg by mouth daily. 04/07/24   [provider]  aspirin  EC 81 MG tablet Take 1 tablet (81 mg total) by mouth daily. Swallow whole. 12/30/23   Segal, Jared E, DO  atorvastatin  (LIPITOR) 40 MG tablet TAKE 1 TABLET(40 MG) BY MOUTH DAILY AT 6 PM 03/29/24   Landy Barnie RAMAN, NP  calcium  carbonate (OSCAL) 1500 (600 Ca) MG TABS tablet Take 600 mg by mouth at bedtime.    [provider]  Cholecalciferol (VITAMIN D3 PO) Take 2,000 Units by mouth at bedtime.    [provider]  docusate sodium  (COLACE) 100 MG capsule Take 100 mg by mouth at bedtime.    [provider]  famotidine  (PEPCID ) 20 MG tablet Take 1 tablet (20 mg total) by mouth 2 (two) times daily. Patient not taking: Reported on 04/15/2024 03/27/22   Zehr, Jessica D, PA-C  fluticasone  (FLONASE ) 50 MCG/ACT nasal spray SHAKE LIQUID AND USE 2 SPRAYS IN EACH NOSTRIL DAILY 12/07/18  Shelah Lamar RAMAN, MD  furosemide  (LASIX ) 40 MG tablet Take 1 tablet (40 mg total) by mouth daily. 03/29/24   Landy Barnie RAMAN, NP  gabapentin  (NEURONTIN ) 600 MG tablet Take 1 tablet (600 mg total) by mouth 3 (three) times daily. 03/29/24   Landy Barnie RAMAN, NP  insulin  aspart (NOVOLOG  FLEXPEN) 100 UNIT/ML FlexPen Inject 5 Units into the skin 3 (three) times daily with meals. For cbg>=150 03/29/24   Landy Barnie RAMAN, NP  levothyroxine  (SYNTHROID ) 50 MCG tablet Take 1 tablet (50 mcg total) by mouth daily before breakfast. 03/29/24   Landy Barnie RAMAN, NP  melatonin 5 MG TABS Take 5 mg by mouth at bedtime. Patient not taking: Reported on 04/15/2024    [provider]  metoprolol  tartrate (LOPRESSOR ) 25 MG tablet Take 0.5 tablets (12.5 mg total) by mouth 2 (two) times daily. 03/29/24   Landy Barnie RAMAN, NP  montelukast  (SINGULAIR ) 10 MG tablet Take 1 tablet (10 mg total) by mouth daily. Patient not taking: Reported on 04/15/2024 03/29/24   Landy Barnie RAMAN, NP  Multiple Vitamin (MULTIVITAMIN WITH MINERALS) TABS tablet Take 1 tablet by mouth at bedtime.    [provider]  Multiple Vitamins-Minerals (PRESERVISION AREDS 2+MULTI VIT PO) Take 1 tablet by mouth in the morning.    [provider]  nystatin  (MYCOSTATIN /NYSTOP ) powder Apply topically 3 (three) times daily. 03/29/24   Landy Barnie RAMAN, NP  olopatadine (HM EYE ALLERGY ITCH/RED RELIEF) 0.1 % ophthalmic solution Place 1-2 drops into both eyes 2 (two) times daily as needed for allergies.    [provider]  omeprazole  (PRILOSEC) 20 MG capsule TAKE 1 CAPSULE(20 MG) BY MOUTH DAILY 06/23/23   Zehr, Jessica D, PA-C  polyethylene glycol (MIRALAX  / GLYCOLAX ) 17 g packet Take 17 g by mouth daily as needed (constipation).    [provider]  potassium chloride  (KLOR-CON ) 10 MEQ tablet Take 1 tablet (10 mEq total) by mouth in the morning. 03/29/24   Landy Barnie RAMAN, NP  sennosides-docusate sodium  (SENOKOT-S) 8.6-50 MG tablet Take 1 tablet by mouth daily.    [provider]  tamoxifen  (NOLVADEX ) 20 MG tablet Take 1 tablet (20 mg total) by mouth daily. 03/29/24   Landy Barnie RAMAN, NP     Family History  Problem Relation Age of Onset   Heart disease Father        heart problems   Kidney failure Mother    Diabetes Mother    Leukemia Brother    Diabetes Brother    Colon cancer Sister    Leukemia Brother    Diabetes Brother    Pancreatic cancer Brother    Bone cancer Sister     Social History   Socioeconomic History   Marital status: Widowed    Spouse name: Not on file   Number of children: 2   Years of education: 8   Highest education level: Not  on file  Occupational History   Occupation: Retired  Tobacco Use   Smoking status: Never   Smokeless tobacco: Never  Vaping Use   Vaping status: Never Used  Substance and Sexual Activity   Alcohol  use: No   Drug use: Never   Sexual activity: Not Currently    Birth control/protection: Surgical    Comment: hyst  Other Topics Concern   Not on file  Social History Narrative   Right handed   Tea daily   Lives alone   Social Drivers of Health   Tobacco Use: Low Risk (  04/15/2024)   Patient History    Smoking Tobacco Use: Never    Smokeless Tobacco Use: Never    Passive Exposure: Not on file  Financial Resource Strain: Not on file  Food Insecurity: No Food Insecurity (03/04/2024)   Epic    Worried About Programme Researcher, Broadcasting/film/video in the Last Year: Never true    Ran Out of Food in the Last Year: Never true  Transportation Needs: No Transportation Needs (03/04/2024)   Epic    Lack of Transportation (Medical): No    Lack of Transportation (Non-Medical): No  Physical Activity: Not on file  Stress: Not on file  Social Connections: Socially Isolated (03/07/2024)   Social Connection and Isolation Panel    Frequency of Communication with Friends and Family: More than three times a week    Frequency of Social Gatherings with Friends and Family: More than three times a week    Attends Religious Services: Never    Database Administrator or Organizations: No    Attends Banker Meetings: Never    Marital Status: Widowed  Depression (PHQ2-9): Medium Risk (03/24/2024)   Depression (PHQ2-9)    PHQ-2 Score: 5  Alcohol  Screen: Not on file  Housing: Low Risk (03/04/2024)   Epic    Unable to Pay for Housing in the Last Year: No    Number of Times Moved in the Last Year: 0    Homeless in the Last Year: No  Utilities: Not At Risk (03/04/2024)   Epic    Threatened with loss of utilities: No  Health Literacy: Not on file      Review of Systems: denies fever,HA,CP,cough, abd/back  pain,N/V or bleeding; she does have chronic dyspnea; also has some left upper chest soreness, poor vision  Vital Signs: Vitals:   04/19/24 0726  BP: (!) 190/54  Pulse: 77  Resp: 16  Temp: 98.2 F (36.8 C)  SpO2: 98%      Advance Care Plan: no documents on file  Physical Exam: awake/answers simple questions ok; chest- distant BS bilat/ sl dim bases; heart- RRR; abd-soft, obese, +BS,NT; tr-1+ pretibial edema bilat  Imaging: No results found.  Labs:  CBC: Recent Labs    03/08/24 0507 03/15/24 0800 03/24/24 0918 04/07/24 1258  WBC 6.9 7.9 7.0 6.8  HGB 9.1* 8.6* 8.3* 8.5*  HCT 28.4* 28.0* 27.5* 28.2*  PLT 141* 147* 97* 185    COAGS: Recent Labs    02/27/24 1511  INR 1.0    BMP: Recent Labs    03/07/24 0553 03/08/24 0507 03/15/24 0800 03/24/24 0918  NA 140 139 140 143  K 4.0 3.9 4.2 4.2  CL 95* 94* 95* 98  CO2 38* 38* 37* 36*  GLUCOSE 234* 275* 166* 204*  BUN 31* 31* 24* 26*  CALCIUM  9.0 8.8* 8.8* 9.0  CREATININE 0.90 0.96 0.86 1.05*  GFRNONAA 59* 55* >60 49*    LIVER FUNCTION TESTS: Recent Labs    02/11/24 1106 02/27/24 1511 03/04/24 0931 03/05/24 0503  BILITOT 0.5 0.8 0.5 0.4  AST 25 24 32 26  ALT 18 18 19 19   ALKPHOS 98 66 105 77  PROT 6.3* 6.1* 6.5 5.7*  ALBUMIN  3.7 3.1* 3.6 3.2*    TUMOR MARKERS: No results for input(s): AFPTM, CEA, CA199, CHROMGRNA in the last 8760 hours.  Assessment and Plan: 89 y.o. female with PMH sig for arthritis, asthma, COPD, DM, GERD, HTN,HLD, hypothyroidism, rt breast cancer 2025, s/p mastectomy and chronic macrocytic anemia/thrombocytopenia with SF3B1  mutation. She is scheduled today for image guided bone marrow biopsy to rule out MDS.Risks and benefits of procedure was discussed with the patient /daughter including, but not limited to bleeding, infection, damage to adjacent structures or low yield requiring additional tests.  All of the questions were answered and there is agreement to  proceed.  Consent signed and in chart.    Thank you for allowing our service to participate in Sandra Cuevas 's care.  Electronically Signed: D. Franky Rakers, PA-C   04/16/2024, 3:29 PM      I spent a total of  20 minutes   in face to face in clinical consultation, greater than 50% of which was counseling/coordinating care for image guided bone marrow biopsy i "

## 2024-04-19 ENCOUNTER — Ambulatory Visit (HOSPITAL_COMMUNITY)
Admission: RE | Admit: 2024-04-19 | Discharge: 2024-04-19 | Disposition: A | Discharge status: Home / Self Care | Source: Ambulatory Visit | Attending: Physician Assistant | Admitting: Physician Assistant

## 2024-04-19 ENCOUNTER — Encounter (HOSPITAL_COMMUNITY): Payer: Self-pay

## 2024-04-19 ENCOUNTER — Ambulatory Visit (HOSPITAL_COMMUNITY)
Admission: RE | Admit: 2024-04-19 | Discharge: 2024-04-19 | Disposition: A | Source: Ambulatory Visit | Attending: Physician Assistant

## 2024-04-19 DIAGNOSIS — D641 Secondary sideroblastic anemia due to disease: Secondary | ICD-10-CM | POA: Diagnosis not present

## 2024-04-19 DIAGNOSIS — D696 Thrombocytopenia, unspecified: Secondary | ICD-10-CM | POA: Diagnosis present

## 2024-04-19 DIAGNOSIS — D539 Nutritional anemia, unspecified: Secondary | ICD-10-CM

## 2024-04-19 DIAGNOSIS — Z66 Do not resuscitate: Secondary | ICD-10-CM | POA: Diagnosis not present

## 2024-04-19 HISTORY — PX: IR BONE MARROW BIOPSY & ASPIRATION: IMG5727

## 2024-04-19 LAB — CBC WITH DIFFERENTIAL/PLATELET
Abs Immature Granulocytes: 0.05 K/uL (ref 0.00–0.07)
Basophils Absolute: 0 K/uL (ref 0.0–0.1)
Basophils Relative: 0 %
Eosinophils Absolute: 0.2 K/uL (ref 0.0–0.5)
Eosinophils Relative: 2 %
HCT: 28.5 % — ABNORMAL LOW (ref 36.0–46.0)
Hemoglobin: 8.9 g/dL — ABNORMAL LOW (ref 12.0–15.0)
Immature Granulocytes: 1 %
Lymphocytes Relative: 17 %
Lymphs Abs: 1.4 K/uL (ref 0.7–4.0)
MCH: 32.6 pg (ref 26.0–34.0)
MCHC: 31.2 g/dL (ref 30.0–36.0)
MCV: 104.4 fL — ABNORMAL HIGH (ref 80.0–100.0)
Monocytes Absolute: 1 K/uL (ref 0.1–1.0)
Monocytes Relative: 12 %
Neutro Abs: 5.6 K/uL (ref 1.7–7.7)
Neutrophils Relative %: 68 %
Platelets: 132 K/uL — ABNORMAL LOW (ref 150–400)
RBC: 2.73 MIL/uL — ABNORMAL LOW (ref 3.87–5.11)
RDW: 14.9 % (ref 11.5–15.5)
WBC: 8.3 K/uL (ref 4.0–10.5)
nRBC: 0 % (ref 0.0–0.2)

## 2024-04-19 LAB — GLUCOSE, CAPILLARY: Glucose-Capillary: 172 mg/dL — ABNORMAL HIGH (ref 70–99)

## 2024-04-19 MED ORDER — SODIUM CHLORIDE 0.9 % IV SOLN: INTRAVENOUS | Status: DC

## 2024-04-19 MED ORDER — FENTANYL CITRATE (PF) 100 MCG/2ML IJ SOLN
INTRAMUSCULAR | Status: AC
  Filled 2024-04-19: qty 2

## 2024-04-19 MED ORDER — FENTANYL CITRATE (PF) 100 MCG/2ML IJ SOLN
INTRAMUSCULAR | Status: AC | PRN
  Administered 2024-04-19: 50 ug via INTRAVENOUS

## 2024-04-19 MED ORDER — LIDOCAINE HCL (PF) 1 % IJ SOLN
20.0000 mL | Freq: Once | INTRAMUSCULAR | Status: AC
  Administered 2024-04-19: 20 mL via INTRADERMAL

## 2024-04-19 MED ORDER — MIDAZOLAM HCL (PF) 2 MG/2ML IJ SOLN
INTRAMUSCULAR | Status: AC | PRN
  Administered 2024-04-19: 1 mg via INTRAVENOUS

## 2024-04-19 MED ORDER — LIDOCAINE HCL (PF) 1 % IJ SOLN
INTRAMUSCULAR | Status: AC
  Filled 2024-04-19: qty 30

## 2024-04-19 MED ORDER — MIDAZOLAM HCL 2 MG/2ML IJ SOLN
INTRAMUSCULAR | Status: AC
  Filled 2024-04-19: qty 2

## 2024-04-19 NOTE — Procedures (Signed)
 Vascular and Interventional Radiology Procedure Note  Patient: Sandra Cuevas DOB: [birth year] Medical Record Number: [id number] Note Date/Time: 04/19/2024 9:24 AM   Performing Physician: Thom Hall, MD Assistant(s): None  Diagnosis: Anemia, q MDS   Procedure: BONE MARROW ASPIRATION and BIOPSY  Anesthesia: Conscious Sedation Complications: None Estimated Blood Loss: Minimal Specimens: Sent for Pathology  Findings:  Successful Fluoroscopy-guided bone marrow aspiration and biopsy A total of 1 cores were obtained. Hemostasis of the tract was achieved using Manual Pressure.  Plan: Bed rest for 1 hours.  See detailed procedure note with images in PACS. The patient tolerated the procedure well without incident or complication and was returned to Recovery in stable condition.    Thom Hall, MD [facility]   Pager. [phone] Clinic. [phone]

## 2024-04-19 NOTE — Discharge Instructions (Signed)
 MAY CONTACT INTERVENTIONAL RADIOLOGY CLINIC [phone] WITH ANY QUESTIONS OR CONCERNS  MAY REMOVE DRESSING TOMORROW AND SHOWER

## 2024-04-19 NOTE — Sedation Documentation (Signed)
 Pt placed on 4 L NC

## 2024-04-21 ENCOUNTER — Inpatient Hospital Stay

## 2024-04-21 ENCOUNTER — Inpatient Hospital Stay: Attending: Oncology

## 2024-04-21 DIAGNOSIS — C50311 Malignant neoplasm of lower-inner quadrant of right female breast: Secondary | ICD-10-CM | POA: Insufficient documentation

## 2024-04-21 DIAGNOSIS — Z170 Estrogen receptor positive status [ER+]: Secondary | ICD-10-CM | POA: Diagnosis not present

## 2024-04-21 DIAGNOSIS — Z79810 Long term (current) use of selective estrogen receptor modulators (SERMs): Secondary | ICD-10-CM | POA: Diagnosis not present

## 2024-04-21 DIAGNOSIS — Z1721 Progesterone receptor positive status: Secondary | ICD-10-CM | POA: Diagnosis not present

## 2024-04-21 DIAGNOSIS — D461 Refractory anemia with ring sideroblasts: Secondary | ICD-10-CM | POA: Insufficient documentation

## 2024-04-21 DIAGNOSIS — Z1732 Human epidermal growth factor receptor 2 negative status: Secondary | ICD-10-CM | POA: Insufficient documentation

## 2024-04-21 DIAGNOSIS — D539 Nutritional anemia, unspecified: Secondary | ICD-10-CM

## 2024-04-21 LAB — CBC
HCT: 28.1 % — ABNORMAL LOW (ref 36.0–46.0)
Hemoglobin: 8.5 g/dL — ABNORMAL LOW (ref 12.0–15.0)
MCH: 32 pg (ref 26.0–34.0)
MCHC: 30.2 g/dL (ref 30.0–36.0)
MCV: 105.6 fL — ABNORMAL HIGH (ref 80.0–100.0)
Platelets: 141 K/uL — ABNORMAL LOW (ref 150–400)
RBC: 2.66 MIL/uL — ABNORMAL LOW (ref 3.87–5.11)
RDW: 14.8 % (ref 11.5–15.5)
WBC: 6.1 K/uL (ref 4.0–10.5)
nRBC: 0 % (ref 0.0–0.2)

## 2024-04-21 LAB — SAMPLE TO BLOOD BANK

## 2024-04-21 LAB — SURGICAL PATHOLOGY

## 2024-04-26 ENCOUNTER — Other Ambulatory Visit: Payer: Self-pay | Admitting: Adult Health

## 2024-04-26 ENCOUNTER — Encounter (HOSPITAL_COMMUNITY): Payer: Self-pay

## 2024-04-28 ENCOUNTER — Encounter (HOSPITAL_COMMUNITY): Payer: Self-pay

## 2024-05-04 NOTE — Progress Notes (Signed)
 " Patient Care Team: Sandra Rush, MD as PCP - General (Family Medicine) Sandra Emeline BRAVO, DO as PCP - Cardiology (Cardiology) Sandra Joesph SQUIBB, RN as Oncology Nurse Navigator (Medical Oncology) Sandra Emeline BRAVO, DO (Cardiology)  Clinic Day:  05/05/2024  Referring physician: Marvine Rush, MD   CHIEF COMPLAINT:  CC: Stage I (T2 N0 M0 G2 ER/PR+ HER2-) right breast LIQ invasive mammary carcinoma and MDS  Sandra Cuevas 89 y.o. female was transferred to my care after her prior physician has left.   ASSESSMENT & PLAN:   Assessment & Plan: Sandra Cuevas  is a 89 y.o. female with Stage I right breast carcinoma and MDS  Assessment and Plan Assessment & Plan Myelodysplastic syndrome IPSS-R: 3 points, low risk.  Hematology history below  - Discussed the biopsy results in detail with the patient.  Patient would require treatment with Retacrit  for hemoglobin less than 10. -The patient denies recent history of bleeding such as epistaxis, hematuria or hematochezia. She is symptomatic from the anemia.  -We discussed the risks, benefits, side effects of erythropoietin  stimulating agents for anemia, with the goal of keeping the hemoglobin greater than 10 g. -I discussed with the patient and potential side effects such as risk of thrombosis, severe hypertension, risk of congestive heart failure and stroke and she agreed to proceed. -I will start initiial dosing at 20000IU every 2 weeks and we will reassess her response rates after 3 treatments to assess whether dosage adjustment is needed.  Will administer first dose today.  RTC in 2 months with labs   Stage I breast carcinoma Oncology history below Currently on tamoxifen  20 mg daily  - Patient reports no side effects from tamoxifen  use -Continue tamoxifen  20 mg daily for a total of 5 years - Recommended patient to take aspirin  81 mg daily for prevention of VTE (high risk with tamoxifen , Retacrit , age, immobility) - Last mammogram 07/03/2023.   Will repeat in 1 year that is 06/2024.    The patient understands the plans discussed today and is in agreement with them.  She knows to contact our office if she develops concerns prior to her next appointment.  30 minutes of total time was spent for this patient encounter, including preparation,review of records,  face-to-face counseling with the patient and coordination of care, physical exam, and documentation of the encounter.    Sandra Cuevas,acting as a neurosurgeon for Sandra Dry, MD.,have documented all relevant documentation on the behalf of Sandra Dry, MD,as directed by  Sandra Dry, MD while in the presence of Sandra Dry, MD.  I, Sandra Dry MD, have reviewed the above documentation for accuracy and completeness, and I agree with the above.    Sandra Dry, MD  [facility] [place] - A DEPT OF Sandra Cuevas [facility] [address] [place] [address] Dept: [phone] Dept Fax: [phone]   No orders of the defined types were placed in this encounter.    ONCOLOGY HISTORY:   I have reviewed her chart and materials related to her cancer extensively and collaborated history with the patient. Summary of oncologic history is as follows:   Diagnosis: Stage I (T2 N0 M0 G2 ER/PR+ HER2-) right breast LIQ invasive mammary carcinoma   Patient has history of cervical cancer s/p TAH in her early 12's -04/14/2023: CTA chest: 17 mm nodular area in the inferior right breast was not seen on the prior examination. Correlate with mammography. -07/03/2023: Diagnostic mammogram and  US  of bilateral breast: There is a suspicious mass in the right breast at 5 o'clock measuring 1.9 cm by ultrasound. There is a span of 5-6 cm of indeterminate calcifications in the medial right breast. No evidence of right axillary lymphadenopathy. No evidence of left breast malignancy. - 07/08/2023: Right breast mass biopsy.  Pathology:  Invasive mammary carcinoma with extracellular mucin, grade 2. No lymphovascular invasion. Tumor cells are ER positive (100%), PR positive (80%), and Her2 negative (0) by IHC. Ki-67 5%.   -07/15/2023 - 08/12/2023: Anastrozole , discontinued due to osteoporosis -07/22/2023: Bone Density scan with T-score of -2.5  -07/23/2023: Right Mastectomy.  Pathology: Residual invasive ductal carcinoma, 3 cm, grade 2, with most of the tumor (90%) showing coagulative type necrosis. Resection margins are negative for carcinoma. Negative for lymphovascular or perineural invasion. DCIS not identified. Staging: pT2 -08/13/2023 - current: Tamoxifen  20 mg daily  Diagnosis: Probable MDS  -History of progressive anemia since 2018 and mild thrombocytopenia in 01/2024.  -07/15/2023: CBC diff: HGB-9.2. MCV- 102.7. LDH normal.  -02/11/2024: CBC diff: HGB-9.6. MCV- 106.9. PLT- 133.  -02/11/2024: Ferritin elevated ay 475. Vitamin B 12 elevated at 1,011.  -02/18/2024: Myeloid NGS Panel: SF3B1 variant detected.  -03/24/2024: Serum EPO: 25.7. Cystatin C 1.46. LDH normal.  -04/19/2024: Bone Marrow Biopsy.  Pathology: Cellular bone marrow with trilineage hematopoiesis, mild dyspoiesis and ring sideroblasts. 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   -No alterations detected in CALR, FLT3, IDH1, IDH2, JAK2, MPL, NPM1, TP53  Current Treatment:  Tamoxifen  20mg  daily, Planned retacrit  for MDS  INTERVAL HISTORY:   Sandra Cuevas is a 89 y.o. female with a history of stage I ER/PR positive  breast cancer currently on tamoxifen  and a new diagnosis of MDS presenting for hematology oncology follow-up today.  She is accompanied by her daughter and son-in-law today.  Patient reports no complaints today except for baseline tiredness.  Patient continues to live alone with support from her family.  Discussed the bone marrow biopsy results in detail and that she has MDS and the need to start on Retacrit .  Patient is in agreement with this plan.   I have reviewed the past medical history, past surgical history, social history and family history with the patient and they are unchanged from previous note.  ALLERGIES:  has no known allergies.  MEDICATIONS:  Current Outpatient Medications  Medication Sig Dispense Refill   acetaminophen  (TYLENOL ) 650 MG CR tablet Take 650 mg by mouth every 6 (six) hours as needed for pain.     albuterol  (PROVENTIL ) (2.5 MG/3ML) 0.083% nebulizer solution Take 3 mLs (2.5 mg total) by nebulization every 6 (six) hours as needed for wheezing or shortness of breath. 75 mL 0   amLODipine  (NORVASC ) 5 MG tablet Take 5 mg by mouth daily.     aspirin  EC 81 MG tablet Take 1 tablet (81 mg total) by mouth daily. Swallow whole.     atorvastatin  (LIPITOR) 40 MG tablet TAKE 1 TABLET(40 MG) BY MOUTH DAILY AT 6 PM 30 tablet 0   calcium  carbonate (OSCAL) 1500 (600 Ca) MG TABS tablet Take 600 mg by mouth at bedtime.  Cholecalciferol (VITAMIN D3 PO) Take 2,000 Units by mouth at bedtime.     docusate sodium  (COLACE) 100 MG capsule Take 100 mg by mouth at bedtime.     famotidine  (PEPCID ) 20 MG tablet Take 1 tablet (20 mg total) by mouth 2 (two) times daily. 180 tablet 1   fluticasone  (FLONASE ) 50 MCG/ACT nasal spray SHAKE LIQUID AND USE 2 SPRAYS IN EACH NOSTRIL DAILY 16 g 5   furosemide  (LASIX ) 40 MG tablet Take 1 tablet (40 mg total) by mouth daily. 30 tablet 0   gabapentin  (NEURONTIN ) 600 MG tablet Take 1 tablet (600 mg total) by mouth 3 (three) times daily. 90 tablet 0    insulin  aspart (NOVOLOG  FLEXPEN) 100 UNIT/ML FlexPen Inject 5 Units into the skin 3 (three) times daily with meals. For cbg>=150 15 mL 0   levothyroxine  (SYNTHROID ) 50 MCG tablet Take 1 tablet (50 mcg total) by mouth daily before breakfast. 30 tablet 0   melatonin 5 MG TABS Take 5 mg by mouth at bedtime.     metoprolol  tartrate (LOPRESSOR ) 25 MG tablet Take 0.5 tablets (12.5 mg total) by mouth 2 (two) times daily. 30 tablet 0   montelukast  (SINGULAIR ) 10 MG tablet Take 1 tablet (10 mg total) by mouth daily. 30 tablet 0   Multiple Vitamin (MULTIVITAMIN WITH MINERALS) TABS tablet Take 1 tablet by mouth at bedtime.     Multiple Vitamins-Minerals (PRESERVISION AREDS 2+MULTI VIT PO) Take 1 tablet by mouth in the morning.     nystatin  (MYCOSTATIN /NYSTOP ) powder Apply topically 3 (three) times daily. 15 g 0   olopatadine (HM EYE ALLERGY ITCH/RED RELIEF) 0.1 % ophthalmic solution Place 1-2 drops into both eyes 2 (two) times daily as needed for allergies.     omeprazole  (PRILOSEC) 20 MG capsule TAKE 1 CAPSULE(20 MG) BY MOUTH DAILY 90 capsule 3   polyethylene glycol (MIRALAX  / GLYCOLAX ) 17 g packet Take 17 g by mouth daily as needed (constipation).     potassium chloride  (KLOR-CON ) 10 MEQ tablet Take 1 tablet (10 mEq total) by mouth in the morning. 30 tablet 0   sennosides-docusate sodium  (SENOKOT-S) 8.6-50 MG tablet Take 1 tablet by mouth daily.     tamoxifen  (NOLVADEX ) 20 MG tablet Take 1 tablet (20 mg total) by mouth daily. 30 tablet 0   zolpidem  (AMBIEN ) 5 MG tablet Take 5 mg by mouth daily.     No current facility-administered medications for this visit.    VITALS:  Blood pressure (!) 157/42, pulse 73, temperature 100.3 F (37.9 C), temperature source Tympanic, resp. rate 17, height 5' 4.5 (1.638 m), weight 204 lb (92.5 kg), SpO2 95%.  Wt Readings from Last 3 Encounters:  05/05/24 204 lb (92.5 kg)  04/15/24 205 lb (93 kg)  03/29/24 200 lb (90.7 kg)    Body mass index is 34.48  kg/m.  Performance status (ECOG): 2 - Symptomatic, <50% confined to bed  PHYSICAL EXAM:   GENERAL:alert, no distress and comfortable SKIN: skin color, texture, turgor are normal, no rashes or significant lesions LUNGS: clear to auscultation and percussion with normal breathing effort HEART: regular rate & rhythm and no murmurs and no lower extremity edema ABDOMEN:abdomen soft, non-tender and normal bowel sounds Musculoskeletal:no cyanosis of digits and no clubbing  NEURO: alert & oriented x 3 with fluent speech  LABORATORY DATA:  I have reviewed the data as listed  Lab Results  Component Value Date   WBC 7.5 05/05/2024   NEUTROABS 5.6 04/19/2024   HGB 9.0 (L) 05/05/2024  HCT 29.8 (L) 05/05/2024   MCV 104.6 (H) 05/05/2024   PLT 145 (L) 05/05/2024     Chemistry      Component Value Date/Time   NA 143 03/24/2024 0918   NA 143 12/05/2022 1038   K 4.2 03/24/2024 0918   CL 98 03/24/2024 0918   CO2 36 (H) 03/24/2024 0918   BUN 26 (H) 03/24/2024 0918   BUN 14 12/05/2022 1038   CREATININE 1.05 (H) 03/24/2024 0918   CREATININE 1.22 (H) 04/18/2015 1022      Component Value Date/Time   CALCIUM  9.0 03/24/2024 0918   ALKPHOS 77 03/05/2024 0503   AST 26 03/05/2024 0503   ALT 19 03/05/2024 0503   BILITOT 0.4 03/05/2024 0503   BILITOT 0.5 12/21/2021 0805       RADIOGRAPHIC STUDIES: I have personally reviewed the radiological images as listed and agreed with the findings in the report.  "

## 2024-05-05 ENCOUNTER — Inpatient Hospital Stay

## 2024-05-05 ENCOUNTER — Inpatient Hospital Stay: Admitting: Oncology

## 2024-05-05 VITALS — BP 157/42 | HR 73 | Temp 100.3°F | Resp 17 | Ht 64.5 in | Wt 204.0 lb

## 2024-05-05 DIAGNOSIS — D469 Myelodysplastic syndrome, unspecified: Secondary | ICD-10-CM | POA: Diagnosis not present

## 2024-05-05 DIAGNOSIS — D461 Refractory anemia with ring sideroblasts: Secondary | ICD-10-CM | POA: Diagnosis not present

## 2024-05-05 DIAGNOSIS — D539 Nutritional anemia, unspecified: Secondary | ICD-10-CM

## 2024-05-05 DIAGNOSIS — C50311 Malignant neoplasm of lower-inner quadrant of right female breast: Secondary | ICD-10-CM

## 2024-05-05 DIAGNOSIS — Z170 Estrogen receptor positive status [ER+]: Secondary | ICD-10-CM | POA: Diagnosis not present

## 2024-05-05 LAB — CBC
HCT: 29.8 % — ABNORMAL LOW (ref 36.0–46.0)
Hemoglobin: 9 g/dL — ABNORMAL LOW (ref 12.0–15.0)
MCH: 31.6 pg (ref 26.0–34.0)
MCHC: 30.2 g/dL (ref 30.0–36.0)
MCV: 104.6 fL — ABNORMAL HIGH (ref 80.0–100.0)
Platelets: 145 10*3/uL — ABNORMAL LOW (ref 150–400)
RBC: 2.85 MIL/uL — ABNORMAL LOW (ref 3.87–5.11)
RDW: 14.5 % (ref 11.5–15.5)
WBC: 7.5 10*3/uL (ref 4.0–10.5)
nRBC: 0 % (ref 0.0–0.2)

## 2024-05-05 LAB — SAMPLE TO BLOOD BANK

## 2024-05-05 MED ORDER — EPOETIN ALFA-EPBX 20000 UNIT/ML IJ SOLN
20000.0000 [IU] | Freq: Once | INTRAMUSCULAR | Status: AC
  Administered 2024-05-05: 20000 [IU] via SUBCUTANEOUS
  Filled 2024-05-05: qty 1

## 2024-05-05 NOTE — Patient Instructions (Signed)
 [facility] [place] - A DEPT OF [facility]  Discharge Instructions: Thank you for choosing [facility] to provide your oncology and hematology care.  If you have a lab appointment with the Cancer Center - please note that after April 8th, 2024, all labs will be drawn in the cancer center.  You do not have to check in or register with the main entrance as you have in the past but will complete your check-in in the cancer center.  Wear comfortable clothing and clothing appropriate for easy access to any Portacath or PICC line.   We strive to give you quality time with your provider. You may need to reschedule your appointment if you arrive late (15 or more minutes).  Arriving late affects you and other patients whose appointments are after yours.  Also, if you miss three or more appointments without notifying the office, you may be dismissed from the clinic at the provider's discretion.      For prescription refill requests, have your pharmacy contact our office and allow 72 hours for refills to be completed.    Today you received the following chemotherapy and/or immunotherapy agents retacrit. Epoetin Alfa Injection What is this medication? EPOETIN ALFA (e POE e tin AL fa) treats low levels of red blood cells (anemia) caused by kidney disease, chemotherapy, or HIV medications. It can also be used in people who are at risk for blood loss during surgery. It works by Systems analyst make more red blood cells, which reduces the need for blood transfusions. This medicine may be used for other purposes; ask your health care provider or pharmacist if you have questions. COMMON BRAND NAME(S): Epogen, Procrit, Retacrit What should I tell my care team before I take this medication? They need to know if you have any of these conditions: Blood clots Cancer Heart disease High blood pressure On dialysis Seizures Stroke An unusual or allergic reaction to epoetin  alfa, albumin, benzyl alcohol, other medications, foods, dyes, or preservatives Pregnant or trying to get pregnant Breast-feeding How should I use this medication? This medication is injected into a vein or under the skin. It is usually given by your care team in a hospital or clinic setting. It may also be given at home. If you get this medication at home, you will be taught how to prepare and give it. Use exactly as directed. Take it as directed on the prescription label at the same time every day. Keep taking it unless your care team tells you to stop. It is important that you put your used needles and syringes in a special sharps container. Do not put them in a trash can. If you do not have a sharps container, call your pharmacist or care team to get one. A special MedGuide will be given to you by the pharmacist with each prescription and refill. Be sure to read this information carefully each time. Talk to your care team about the use of this medication in children. While this medication may be used in children as young as 1 month of age for selected conditions, precautions do apply. Overdosage: If you think you have taken too much of this medicine contact a poison control center or emergency room at once. NOTE: This medicine is only for you. Do not share this medicine with others. What if I miss a dose? If you miss a dose, take it as soon as you can. If it is almost time for  your next dose, take only that dose. Do not take double or extra doses. What may interact with this medication? Darbepoetin alfa Methoxy polyethylene glycol-epoetin beta This list may not describe all possible interactions. Give your health care provider a list of all the medicines, herbs, non-prescription drugs, or dietary supplements you use. Also tell them if you smoke, drink alcohol, or use illegal drugs. Some items may interact with your medicine. What should I watch for while using this medication? Visit your care  team for regular checks on your progress. Check your blood pressure as directed. Know what your blood pressure should be and when to contact your care team. Your condition will be monitored carefully while you are receiving this medication. You may need blood work while taking this medication. What side effects may I notice from receiving this medication? Side effects that you should report to your care team as soon as possible: Allergic reactions--skin rash, itching, hives, swelling of the face, lips, tongue, or throat Blood clot--pain, swelling, or warmth in the leg, shortness of breath, chest pain Heart attack--pain or tightness in the chest, shoulders, arms, or jaw, nausea, shortness of breath, cold or clammy skin, feeling faint or lightheaded Increase in blood pressure Rash, fever, and swollen lymph nodes Redness, blistering, peeling, or loosening of the skin, including inside the mouth Seizures Stroke--sudden numbness or weakness of the face, arm, or leg, trouble speaking, confusion, trouble walking, loss of balance or coordination, dizziness, severe headache, change in vision Side effects that usually do not require medical attention (report to your care team if they continue or are bothersome): Bone, joint, or muscle pain Cough Headache Nausea Pain, redness, or irritation at injection site This list may not describe all possible side effects. Call your doctor for medical advice about side effects. You may report side effects to FDA at 1-800-FDA-1088. Where should I keep my medication? Keep out of the reach of children and pets. Store in a refrigerator. Do not freeze. Do not shake. Protect from light. Keep this medication in the original container until you are ready to take it. See product for storage information. Get rid of any unused medication after the expiration date. To get rid of medications that are no longer needed or have expired: Take the medication to a medication take-back  program. Check with your pharmacy or law enforcement to find a location. If you cannot return the medication, ask your pharmacist or care team how to get rid of the medication safely. NOTE: This sheet is a summary. It may not cover all possible information. If you have questions about this medicine, talk to your doctor, pharmacist, or health care provider.  2024 Elsevier/Gold Standard (2021-07-27 00:00:00)      To help prevent nausea and vomiting after your treatment, we encourage you to take your nausea medication as directed.  BELOW ARE SYMPTOMS THAT SHOULD BE REPORTED IMMEDIATELY: *FEVER GREATER THAN 100.4 F (38 C) OR HIGHER *CHILLS OR SWEATING *NAUSEA AND VOMITING THAT IS NOT CONTROLLED WITH YOUR NAUSEA MEDICATION *UNUSUAL SHORTNESS OF BREATH *UNUSUAL BRUISING OR BLEEDING *URINARY PROBLEMS (pain or burning when urinating, or frequent urination) *BOWEL PROBLEMS (unusual diarrhea, constipation, pain near the anus) TENDERNESS IN MOUTH AND THROAT WITH OR WITHOUT PRESENCE OF ULCERS (sore throat, sores in mouth, or a toothache) UNUSUAL RASH, SWELLING OR PAIN  UNUSUAL VAGINAL DISCHARGE OR ITCHING   Items with * indicate a potential emergency and should be followed up as soon as possible or go to the Emergency Department if  any problems should occur.  Please show the CHEMOTHERAPY ALERT CARD or IMMUNOTHERAPY ALERT CARD at check-in to the Emergency Department and triage nurse.  Should you have questions after your visit or need to cancel or reschedule your appointment, please contact [facility] [place] - A DEPT OF Eligha Bridegroom [facility] [phone]  and follow the prompts.  Office hours are 8:00 a.m. to 4:30 p.m. Monday - Friday. Please note that voicemails left after 4:00 p.m. may not be returned until the following business day.  We are closed weekends and major holidays. You have access to a nurse at all times for urgent questions. Please call the main number to the clinic  [phone] and follow the prompts.  For any non-urgent questions, you may also contact your provider using MyChart. We now offer e-Visits for anyone 49 and older to request care online for non-urgent symptoms. For details visit [email or web address].   Also download the MyChart app! Go to the app store, search "MyChart", open the app, select [place], and log in with your MyChart username and password.

## 2024-05-05 NOTE — Patient Instructions (Addendum)
 [facility] at [facility] Discharge Instructions   You were seen and examined today by Dr. Davonna.  She reviewed the results of your lab work which are normal/stable.   We will arrange for you to have lab work and injection called Retacrit  every 2 weeks. This is to help keep your hemoglobin from dropping too low.   Dr. Davonna will see you back in 8 weeks.    Return as scheduled.    Thank you for choosing [facility] at [facility] to provide your oncology and hematology care.  To afford each patient quality time with our provider, please arrive at least 15 minutes before your scheduled appointment time.   If you have a lab appointment with the Cancer Center please come in thru the Main Entrance and check in at the main information desk.  You need to re-schedule your appointment should you arrive 10 or more minutes late.  We strive to give you quality time with our providers, and arriving late affects you and other patients whose appointments are after yours.  Also, if you no show three or more times for appointments you may be dismissed from the clinic at the providers discretion.     Again, thank you for choosing [facility].  Our hope is that these requests will decrease the amount of time that you wait before being seen by our physicians.       _____________________________________________________________  Should you have questions after your visit to [facility], please contact our office at [phone] and follow the prompts.  Our office hours are 8:00 a.m. and 4:30 p.m. Monday - Friday.  Please note that voicemails left after 4:00 p.m. may not be returned until the following business day.  We are closed weekends and major holidays.  You do have access to a nurse 24-7, just call the main number to the clinic [phone] and do not press any options, hold on the line and a nurse will answer the phone.    For  prescription refill requests, have your pharmacy contact our office and allow 72 hours.    Due to Covid, you will need to wear a mask upon entering the hospital. If you do not have a mask, a mask will be given to you at the Main Entrance upon arrival. For doctor visits, patients may have 1 support person age 35 or older with them. For treatment visits, patients can not have anyone with them due to social distancing guidelines and our immunocompromised population.

## 2024-05-05 NOTE — Progress Notes (Signed)
 Sandra Cuevas presents today for injection per the provider's orders.  Retacrit  administration without incident; injection site WNL; see MAR for injection details.  Patient tolerated procedure well and without incident.  No questions or concerns noted at this time. Discharged from clinic by wheel chair in stable condition. Alert and oriented x 3. F/U with [facility] as scheduled.

## 2024-05-07 ENCOUNTER — Other Ambulatory Visit: Payer: Self-pay

## 2024-05-07 ENCOUNTER — Encounter (HOSPITAL_COMMUNITY): Payer: Self-pay | Admitting: Emergency Medicine

## 2024-05-07 ENCOUNTER — Emergency Department (HOSPITAL_COMMUNITY)

## 2024-05-07 ENCOUNTER — Emergency Department (HOSPITAL_COMMUNITY)
Admission: EM | Admit: 2024-05-07 | Discharge: 2024-05-08 | Disposition: A | Attending: Emergency Medicine | Admitting: Emergency Medicine

## 2024-05-07 DIAGNOSIS — S0990XA Unspecified injury of head, initial encounter: Secondary | ICD-10-CM | POA: Diagnosis present

## 2024-05-07 DIAGNOSIS — N189 Chronic kidney disease, unspecified: Secondary | ICD-10-CM | POA: Insufficient documentation

## 2024-05-07 DIAGNOSIS — E119 Type 2 diabetes mellitus without complications: Secondary | ICD-10-CM | POA: Insufficient documentation

## 2024-05-07 DIAGNOSIS — Y92009 Unspecified place in unspecified non-institutional (private) residence as the place of occurrence of the external cause: Secondary | ICD-10-CM | POA: Insufficient documentation

## 2024-05-07 DIAGNOSIS — W19XXXA Unspecified fall, initial encounter: Secondary | ICD-10-CM

## 2024-05-07 DIAGNOSIS — Z794 Long term (current) use of insulin: Secondary | ICD-10-CM | POA: Diagnosis not present

## 2024-05-07 DIAGNOSIS — W01198A Fall on same level from slipping, tripping and stumbling with subsequent striking against other object, initial encounter: Secondary | ICD-10-CM | POA: Diagnosis not present

## 2024-05-07 DIAGNOSIS — S199XXA Unspecified injury of neck, initial encounter: Secondary | ICD-10-CM | POA: Insufficient documentation

## 2024-05-07 DIAGNOSIS — Z79899 Other long term (current) drug therapy: Secondary | ICD-10-CM | POA: Diagnosis not present

## 2024-05-07 DIAGNOSIS — I509 Heart failure, unspecified: Secondary | ICD-10-CM | POA: Insufficient documentation

## 2024-05-07 DIAGNOSIS — S3992XA Unspecified injury of lower back, initial encounter: Secondary | ICD-10-CM | POA: Diagnosis not present

## 2024-05-07 DIAGNOSIS — I130 Hypertensive heart and chronic kidney disease with heart failure and stage 1 through stage 4 chronic kidney disease, or unspecified chronic kidney disease: Secondary | ICD-10-CM | POA: Insufficient documentation

## 2024-05-07 DIAGNOSIS — Z7982 Long term (current) use of aspirin: Secondary | ICD-10-CM | POA: Diagnosis not present

## 2024-05-07 DIAGNOSIS — S22080A Wedge compression fracture of T11-T12 vertebra, initial encounter for closed fracture: Secondary | ICD-10-CM

## 2024-05-07 NOTE — ED Notes (Signed)
Pure Wick applied 

## 2024-05-07 NOTE — ED Triage Notes (Addendum)
 Pt slipped and fell at home. States she hit her head at hairline area. Denies pain to head but c/o back pain. CBG was 440 per EMS. Pt in c collar placed by EMS. Pt with chronic back pain but states pain is worse now. Pt wears O2 @3l  at all times. Per EMS, family states pt is on blood thinner but they did not know name of it. Pt with slight bruising noted to L hairline/scalp area.

## 2024-05-08 ENCOUNTER — Emergency Department (HOSPITAL_COMMUNITY)

## 2024-05-08 MED ORDER — HYDROCODONE-ACETAMINOPHEN 5-325 MG PO TABS
2.0000 | ORAL_TABLET | Freq: Once | ORAL | Status: AC
  Administered 2024-05-08: 2 via ORAL
  Filled 2024-05-08: qty 2

## 2024-05-08 MED ORDER — HYDROCODONE-ACETAMINOPHEN 5-325 MG PO TABS: 1.0000 | ORAL_TABLET | Freq: Four times a day (QID) | ORAL | 0 refills | Status: AC | PRN

## 2024-05-08 NOTE — ED Notes (Signed)
 Called pt daughter and she will be headed up to get pt.

## 2024-05-08 NOTE — ED Provider Notes (Signed)
 " [facility]PARTMENT AT [facility] Provider Note   CSN: [id number] Arrival date & time: 05/07/24  2209     Patient presents with: Sandra Cuevas is a 89 y.o. female.   Patient is a 89 year old female with past medical history of hypertension, hyperlipidemia, diabetes, chronic renal insufficiency, CHF.  Patient presenting today for evaluation of a fall.  Patient has been experiencing episodes where her arms and legs begin to shake and Sandra Cuevas loses strength.  This has been ongoing for quite some time, but no definitive cause has been found.  Sandra Cuevas had 1 of these episodes this evening which caused her to fall forward and strike the side of her head.  Sandra Cuevas is describing discomfort in her head, neck, upper back, and lower back.  Sandra Cuevas denies any weakness or numbness.  No visual disturbances.       Prior to Admission medications  Medication Sig Start Date End Date Taking? Authorizing Provider  acetaminophen  (TYLENOL ) 650 MG CR tablet Take 650 mg by mouth every 6 (six) hours as needed for pain.    [provider]  albuterol  (PROVENTIL ) (2.5 MG/3ML) 0.083% nebulizer solution Take 3 mLs (2.5 mg total) by nebulization every 6 (six) hours as needed for wheezing or shortness of breath. 03/29/24   Landy Barnie RAMAN, NP  amLODipine  (NORVASC ) 5 MG tablet Take 5 mg by mouth daily. 04/07/24   [provider]  aspirin  EC 81 MG tablet Take 1 tablet (81 mg total) by mouth daily. Swallow whole. 12/30/23   Segal, Jared E, DO  atorvastatin  (LIPITOR) 40 MG tablet TAKE 1 TABLET(40 MG) BY MOUTH DAILY AT 6 PM 03/29/24   Landy Barnie RAMAN, NP  calcium  carbonate (OSCAL) 1500 (600 Ca) MG TABS tablet Take 600 mg by mouth at bedtime.    [provider]  Cholecalciferol (VITAMIN D3 PO) Take 2,000 Units by mouth at bedtime.    [provider]  docusate sodium  (COLACE) 100 MG capsule Take 100 mg by mouth at bedtime.    [provider]  famotidine  (PEPCID ) 20  MG tablet Take 1 tablet (20 mg total) by mouth 2 (two) times daily. 03/27/22   Zehr, Jessica D, PA-C  fluticasone  (FLONASE ) 50 MCG/ACT nasal spray SHAKE LIQUID AND USE 2 SPRAYS IN EACH NOSTRIL DAILY 12/07/18   Shelah Lamar RAMAN, MD  furosemide  (LASIX ) 40 MG tablet Take 1 tablet (40 mg total) by mouth daily. 03/29/24   Landy Barnie RAMAN, NP  gabapentin  (NEURONTIN ) 600 MG tablet Take 1 tablet (600 mg total) by mouth 3 (three) times daily. 03/29/24   Landy Barnie RAMAN, NP  insulin  aspart (NOVOLOG  FLEXPEN) 100 UNIT/ML FlexPen Inject 5 Units into the skin 3 (three) times daily with meals. For cbg>=150 03/29/24   Landy Barnie RAMAN, NP  levothyroxine  (SYNTHROID ) 50 MCG tablet Take 1 tablet (50 mcg total) by mouth daily before breakfast. 03/29/24   Landy Barnie RAMAN, NP  melatonin 5 MG TABS Take 5 mg by mouth at bedtime.    [provider]  metoprolol  tartrate (LOPRESSOR ) 25 MG tablet Take 0.5 tablets (12.5 mg total) by mouth 2 (two) times daily. 03/29/24   Landy Barnie RAMAN, NP  montelukast  (SINGULAIR ) 10 MG tablet Take 1 tablet (10 mg total) by mouth daily. 03/29/24   Landy Barnie RAMAN, NP  Multiple Vitamin (MULTIVITAMIN WITH MINERALS) TABS tablet Take 1 tablet by mouth at bedtime.    [provider]  Multiple Vitamins-Minerals (PRESERVISION AREDS 2+MULTI VIT  PO) Take 1 tablet by mouth in the morning.    [provider]  nystatin  (MYCOSTATIN /NYSTOP ) powder Apply topically 3 (three) times daily. 03/29/24   Landy Barnie RAMAN, NP  olopatadine (HM EYE ALLERGY ITCH/RED RELIEF) 0.1 % ophthalmic solution Place 1-2 drops into both eyes 2 (two) times daily as needed for allergies.    [provider]  omeprazole  (PRILOSEC) 20 MG capsule TAKE 1 CAPSULE(20 MG) BY MOUTH DAILY 06/23/23   Zehr, Jessica D, PA-C  polyethylene glycol (MIRALAX  / GLYCOLAX ) 17 g packet Take 17 g by mouth daily as needed (constipation).    [provider]  potassium chloride  (KLOR-CON ) 10 MEQ tablet Take 1  tablet (10 mEq total) by mouth in the morning. 03/29/24   Landy Barnie RAMAN, NP  sennosides-docusate sodium  (SENOKOT-S) 8.6-50 MG tablet Take 1 tablet by mouth daily.    [provider]  tamoxifen  (NOLVADEX ) 20 MG tablet Take 1 tablet (20 mg total) by mouth daily. 03/29/24   Landy Barnie RAMAN, NP  zolpidem  (AMBIEN ) 5 MG tablet Take 5 mg by mouth daily. 04/20/24   [provider]    Allergies: Patient has no known allergies.    Review of Systems  All other systems reviewed and are negative.   Updated Vital Signs BP (!) 141/53   Pulse 81   Temp 98.3 F (36.8 C) (Oral)   Resp (!) 29   Ht 5' 4 (1.626 m)   Wt 93 kg   SpO2 98%   BMI 35.19 kg/m   Physical Exam Vitals and nursing note reviewed.  Constitutional:      General: Sandra Cuevas is not in acute distress.    Appearance: Sandra Cuevas is well-developed. Sandra Cuevas is not diaphoretic.  HENT:     Head: Normocephalic and atraumatic.  Cardiovascular:     Rate and Rhythm: Normal rate and regular rhythm.     Heart sounds: No murmur heard.    No friction rub. No gallop.  Pulmonary:     Effort: Pulmonary effort is normal. No respiratory distress.     Breath sounds: Normal breath sounds. No wheezing.  Abdominal:     General: Bowel sounds are normal. There is no distension.     Palpations: Abdomen is soft.     Tenderness: There is no abdominal tenderness.  Musculoskeletal:        General: Normal range of motion.     Cervical back: Normal range of motion and neck supple.     Comments: There is tenderness to palpation in the lower thoracic/upper lumbar region.  No bony tenderness or step-offs.  Skin:    General: Skin is warm and dry.  Neurological:     General: No focal deficit present.     Mental Status: Sandra Cuevas is alert and oriented to person, place, and time.     Cranial Nerves: No cranial nerve deficit.     Motor: No weakness.     (all labs ordered are listed, but only abnormal results are displayed) Labs Reviewed - No data to  display  EKG: None  Radiology: No results found.   Procedures   Medications Ordered in the ED - No data to display                                  Medical Decision Making Amount and/or Complexity of Data Reviewed Radiology: ordered.  Risk Prescription drug management.   Patient presenting here with complaints  of a fall.  Sandra Cuevas reports falling forward and injuring her head, neck, upper, and lower back.  Patient is neurologically intact.  On exam, Sandra Cuevas does have some tenderness in the lower thoracic/upper lumbar region, but no step-offs.  CT scan of the head, cervical spine, thoracic spine, and lumbar spine obtained, all of which are unremarkable with the exception of a possibly acute T11 endplate fracture.  Patient was given hydrocodone  and seems to be feeling better.  Sandra Cuevas was able to stand with assistance and dress and I feel can safely be discharged.     Final diagnoses:  None    ED Discharge Orders     None          Geroldine Berg, MD 05/08/24 [phone]  "

## 2024-05-08 NOTE — ED Notes (Signed)
 This RN assisted primary RN with ambulating Pt on pulse Ox. Pt required Walker assistive device to ambulate at baseline. Pt maintained 95% O2 Sats on 3L Nasal Cannula at baseline. Pts HR ranged from 113BPM to 123BPM while exerting herself. EDP notified and aware.

## 2024-05-19 ENCOUNTER — Inpatient Hospital Stay: Attending: Oncology

## 2024-05-19 ENCOUNTER — Inpatient Hospital Stay

## 2024-05-26 ENCOUNTER — Inpatient Hospital Stay

## 2024-06-02 ENCOUNTER — Inpatient Hospital Stay

## 2024-06-02 ENCOUNTER — Inpatient Hospital Stay: Admitting: Physician Assistant

## 2024-06-16 ENCOUNTER — Inpatient Hospital Stay

## 2024-06-16 ENCOUNTER — Inpatient Hospital Stay: Admitting: Physician Assistant

## 2024-06-16 ENCOUNTER — Inpatient Hospital Stay: Attending: Oncology

## 2024-06-30 ENCOUNTER — Inpatient Hospital Stay

## 2024-06-30 ENCOUNTER — Inpatient Hospital Stay: Admitting: Oncology

## 2024-12-29 ENCOUNTER — Other Ambulatory Visit (HOSPITAL_COMMUNITY)
